# Patient Record
Sex: Male | Born: 1935 | Race: White | Hispanic: No | Marital: Married | Smoking: Never smoker
Health system: Southern US, Community
[De-identification: ages and names within clinical notes are randomized; demographics above are authoritative.]

## PROBLEM LIST (undated history)

## (undated) ENCOUNTER — Emergency Department (HOSPITAL_COMMUNITY): Payer: Medicare Other | Source: Home / Self Care

## (undated) DIAGNOSIS — I4891 Unspecified atrial fibrillation: Secondary | ICD-10-CM

## (undated) DIAGNOSIS — E119 Type 2 diabetes mellitus without complications: Secondary | ICD-10-CM

## (undated) DIAGNOSIS — E782 Mixed hyperlipidemia: Secondary | ICD-10-CM

## (undated) DIAGNOSIS — I719 Aortic aneurysm of unspecified site, without rupture: Secondary | ICD-10-CM

## (undated) DIAGNOSIS — K219 Gastro-esophageal reflux disease without esophagitis: Secondary | ICD-10-CM

## (undated) DIAGNOSIS — T4145XA Adverse effect of unspecified anesthetic, initial encounter: Secondary | ICD-10-CM

## (undated) DIAGNOSIS — D649 Anemia, unspecified: Secondary | ICD-10-CM

## (undated) DIAGNOSIS — I10 Essential (primary) hypertension: Secondary | ICD-10-CM

## (undated) DIAGNOSIS — I509 Heart failure, unspecified: Secondary | ICD-10-CM

## (undated) DIAGNOSIS — J449 Chronic obstructive pulmonary disease, unspecified: Secondary | ICD-10-CM

## (undated) DIAGNOSIS — I351 Nonrheumatic aortic (valve) insufficiency: Secondary | ICD-10-CM

## (undated) DIAGNOSIS — T8859XA Other complications of anesthesia, initial encounter: Secondary | ICD-10-CM

## (undated) DIAGNOSIS — C801 Malignant (primary) neoplasm, unspecified: Secondary | ICD-10-CM

## (undated) DIAGNOSIS — Z87898 Personal history of other specified conditions: Secondary | ICD-10-CM

## (undated) DIAGNOSIS — I2510 Atherosclerotic heart disease of native coronary artery without angina pectoris: Secondary | ICD-10-CM

## (undated) DIAGNOSIS — I639 Cerebral infarction, unspecified: Secondary | ICD-10-CM

## (undated) HISTORY — PX: TOTAL KNEE ARTHROPLASTY: SHX125

## (undated) HISTORY — DX: Heart failure, unspecified: I50.9

## (undated) HISTORY — DX: Mixed hyperlipidemia: E78.2

## (undated) HISTORY — DX: Anemia, unspecified: D64.9

## (undated) HISTORY — DX: Unspecified atrial fibrillation: I48.91

## (undated) HISTORY — PX: CORONARY ARTERY BYPASS GRAFT: SHX141

## (undated) HISTORY — PX: COLON SURGERY: SHX602

## (undated) HISTORY — PX: HERNIA REPAIR: SHX51

## (undated) HISTORY — DX: Cerebral infarction, unspecified: I63.9

## (undated) HISTORY — DX: Chronic obstructive pulmonary disease, unspecified: J44.9

## (undated) HISTORY — DX: Gastro-esophageal reflux disease without esophagitis: K21.9

---

## 2004-06-25 HISTORY — PX: CORONARY ARTERY BYPASS GRAFT: SHX141

## 2004-06-25 HISTORY — PX: AORTIC VALVE REPLACEMENT: SHX41

## 2004-10-26 ENCOUNTER — Encounter (HOSPITAL_COMMUNITY): Admission: RE | Admit: 2004-10-26 | Discharge: 2004-10-27 | Payer: Self-pay | Admitting: Pulmonary Disease

## 2004-10-26 ENCOUNTER — Ambulatory Visit (HOSPITAL_COMMUNITY): Admission: RE | Admit: 2004-10-26 | Discharge: 2004-10-26 | Payer: Self-pay | Admitting: Internal Medicine

## 2004-10-26 ENCOUNTER — Ambulatory Visit: Payer: Self-pay | Admitting: Internal Medicine

## 2004-12-06 ENCOUNTER — Ambulatory Visit (HOSPITAL_COMMUNITY): Admission: RE | Admit: 2004-12-06 | Discharge: 2004-12-06 | Payer: Self-pay | Admitting: *Deleted

## 2004-12-06 ENCOUNTER — Ambulatory Visit: Payer: Self-pay | Admitting: *Deleted

## 2004-12-11 ENCOUNTER — Inpatient Hospital Stay (HOSPITAL_BASED_OUTPATIENT_CLINIC_OR_DEPARTMENT_OTHER): Admission: RE | Admit: 2004-12-11 | Discharge: 2004-12-11 | Payer: Self-pay | Admitting: Cardiovascular Disease

## 2004-12-11 ENCOUNTER — Ambulatory Visit: Payer: Self-pay | Admitting: Cardiovascular Disease

## 2004-12-11 ENCOUNTER — Inpatient Hospital Stay (HOSPITAL_COMMUNITY): Admission: AD | Admit: 2004-12-11 | Discharge: 2004-12-19 | Payer: Self-pay | Admitting: *Deleted

## 2004-12-12 ENCOUNTER — Ambulatory Visit: Payer: Self-pay | Admitting: *Deleted

## 2004-12-14 ENCOUNTER — Encounter (INDEPENDENT_AMBULATORY_CARE_PROVIDER_SITE_OTHER): Payer: Self-pay | Admitting: Specialist

## 2004-12-28 ENCOUNTER — Ambulatory Visit: Payer: Self-pay | Admitting: *Deleted

## 2004-12-28 ENCOUNTER — Ambulatory Visit (HOSPITAL_COMMUNITY): Admission: RE | Admit: 2004-12-28 | Discharge: 2004-12-28 | Payer: Self-pay | Admitting: *Deleted

## 2005-01-26 ENCOUNTER — Ambulatory Visit: Payer: Self-pay | Admitting: Internal Medicine

## 2005-01-26 ENCOUNTER — Ambulatory Visit (HOSPITAL_COMMUNITY): Admission: RE | Admit: 2005-01-26 | Discharge: 2005-01-26 | Payer: Self-pay | Admitting: Internal Medicine

## 2005-01-30 ENCOUNTER — Ambulatory Visit: Payer: Self-pay | Admitting: *Deleted

## 2005-03-01 ENCOUNTER — Ambulatory Visit: Payer: Self-pay | Admitting: *Deleted

## 2005-03-26 ENCOUNTER — Ambulatory Visit: Payer: Self-pay | Admitting: Cardiology

## 2005-05-10 ENCOUNTER — Ambulatory Visit: Payer: Self-pay | Admitting: *Deleted

## 2005-06-01 ENCOUNTER — Ambulatory Visit: Payer: Self-pay

## 2005-06-02 ENCOUNTER — Emergency Department (HOSPITAL_COMMUNITY): Admission: EM | Admit: 2005-06-02 | Discharge: 2005-06-02 | Payer: Self-pay | Admitting: Emergency Medicine

## 2005-08-02 ENCOUNTER — Ambulatory Visit: Payer: Self-pay | Admitting: *Deleted

## 2005-10-29 ENCOUNTER — Ambulatory Visit: Payer: Self-pay | Admitting: *Deleted

## 2005-12-04 ENCOUNTER — Ambulatory Visit: Payer: Self-pay | Admitting: *Deleted

## 2006-02-05 ENCOUNTER — Ambulatory Visit: Payer: Self-pay | Admitting: *Deleted

## 2006-04-24 ENCOUNTER — Ambulatory Visit: Payer: Self-pay | Admitting: *Deleted

## 2006-07-26 ENCOUNTER — Ambulatory Visit (HOSPITAL_COMMUNITY): Admission: RE | Admit: 2006-07-26 | Discharge: 2006-07-26 | Payer: Self-pay | Admitting: Pulmonary Disease

## 2006-07-30 ENCOUNTER — Ambulatory Visit: Payer: Self-pay | Admitting: Cardiology

## 2006-08-01 ENCOUNTER — Ambulatory Visit: Payer: Self-pay | Admitting: *Deleted

## 2006-08-01 LAB — CONVERTED CEMR LAB
Chloride: 97 meq/L (ref 96–112)
GFR calc non Af Amer: 118 mL/min

## 2006-08-02 ENCOUNTER — Ambulatory Visit (HOSPITAL_COMMUNITY): Admission: RE | Admit: 2006-08-02 | Discharge: 2006-08-02 | Payer: Self-pay | Admitting: Pulmonary Disease

## 2006-08-30 ENCOUNTER — Ambulatory Visit: Payer: Self-pay | Admitting: *Deleted

## 2006-08-30 LAB — CONVERTED CEMR LAB
CO2: 30 meq/L (ref 19–32)
Calcium: 9.1 mg/dL (ref 8.4–10.5)
Chloride: 101 meq/L (ref 96–112)
Creatinine, Ser: 0.9 mg/dL (ref 0.4–1.5)
GFR calc Af Amer: 107 mL/min
GFR calc non Af Amer: 88 mL/min
Glucose, Bld: 98 mg/dL (ref 70–99)

## 2007-01-02 ENCOUNTER — Ambulatory Visit: Payer: Self-pay | Admitting: Cardiovascular Disease

## 2007-06-12 ENCOUNTER — Ambulatory Visit (HOSPITAL_COMMUNITY): Admission: RE | Admit: 2007-06-12 | Discharge: 2007-06-12 | Payer: Self-pay | Admitting: Urology

## 2007-06-13 ENCOUNTER — Ambulatory Visit: Admission: RE | Admit: 2007-06-13 | Discharge: 2007-06-24 | Payer: Self-pay | Admitting: Radiation Oncology

## 2007-07-08 ENCOUNTER — Ambulatory Visit: Payer: Self-pay | Admitting: Cardiovascular Disease

## 2007-08-05 ENCOUNTER — Ambulatory Visit: Admission: RE | Admit: 2007-08-05 | Discharge: 2007-11-03 | Payer: Self-pay | Admitting: Radiation Oncology

## 2007-09-19 ENCOUNTER — Ambulatory Visit (HOSPITAL_COMMUNITY): Admission: RE | Admit: 2007-09-19 | Discharge: 2007-09-19 | Payer: Self-pay | Admitting: Pulmonary Disease

## 2007-10-17 ENCOUNTER — Ambulatory Visit (HOSPITAL_BASED_OUTPATIENT_CLINIC_OR_DEPARTMENT_OTHER): Admission: RE | Admit: 2007-10-17 | Discharge: 2007-10-17 | Payer: Self-pay | Admitting: Urology

## 2007-11-12 ENCOUNTER — Ambulatory Visit: Admission: RE | Admit: 2007-11-12 | Discharge: 2007-12-17 | Payer: Self-pay | Admitting: Radiation Oncology

## 2008-07-23 DIAGNOSIS — K219 Gastro-esophageal reflux disease without esophagitis: Secondary | ICD-10-CM

## 2008-07-23 DIAGNOSIS — Z951 Presence of aortocoronary bypass graft: Secondary | ICD-10-CM | POA: Insufficient documentation

## 2008-07-23 DIAGNOSIS — E119 Type 2 diabetes mellitus without complications: Secondary | ICD-10-CM

## 2008-07-23 DIAGNOSIS — E782 Mixed hyperlipidemia: Secondary | ICD-10-CM | POA: Insufficient documentation

## 2008-07-23 DIAGNOSIS — I10 Essential (primary) hypertension: Secondary | ICD-10-CM | POA: Insufficient documentation

## 2008-07-23 DIAGNOSIS — Z952 Presence of prosthetic heart valve: Secondary | ICD-10-CM

## 2008-07-29 ENCOUNTER — Ambulatory Visit: Payer: Self-pay | Admitting: Cardiovascular Disease

## 2008-10-12 ENCOUNTER — Telehealth: Payer: Self-pay | Admitting: Cardiovascular Disease

## 2008-10-22 ENCOUNTER — Encounter: Payer: Self-pay | Admitting: Cardiovascular Disease

## 2008-10-27 ENCOUNTER — Telehealth: Payer: Self-pay | Admitting: Cardiovascular Disease

## 2008-10-29 ENCOUNTER — Ambulatory Visit: Payer: Self-pay | Admitting: Cardiovascular Disease

## 2008-10-29 ENCOUNTER — Ambulatory Visit: Payer: Self-pay

## 2008-10-29 ENCOUNTER — Encounter: Payer: Self-pay | Admitting: Cardiovascular Disease

## 2008-10-29 DIAGNOSIS — R0602 Shortness of breath: Secondary | ICD-10-CM | POA: Insufficient documentation

## 2008-11-03 ENCOUNTER — Telehealth: Payer: Self-pay | Admitting: Cardiovascular Disease

## 2009-01-31 ENCOUNTER — Telehealth: Payer: Self-pay | Admitting: Cardiovascular Disease

## 2009-03-01 ENCOUNTER — Telehealth: Payer: Self-pay | Admitting: Cardiovascular Disease

## 2009-03-30 ENCOUNTER — Telehealth: Payer: Self-pay | Admitting: Cardiovascular Disease

## 2009-04-25 ENCOUNTER — Encounter: Payer: Self-pay | Admitting: Cardiovascular Disease

## 2009-04-27 ENCOUNTER — Encounter (INDEPENDENT_AMBULATORY_CARE_PROVIDER_SITE_OTHER): Payer: Self-pay | Admitting: *Deleted

## 2009-05-26 ENCOUNTER — Telehealth: Payer: Self-pay | Admitting: Cardiovascular Disease

## 2009-06-02 ENCOUNTER — Ambulatory Visit: Payer: Self-pay | Admitting: Cardiovascular Disease

## 2009-08-24 ENCOUNTER — Telehealth: Payer: Self-pay | Admitting: Cardiovascular Disease

## 2009-09-05 ENCOUNTER — Encounter: Payer: Self-pay | Admitting: Cardiovascular Disease

## 2010-01-10 ENCOUNTER — Encounter: Payer: Self-pay | Admitting: Cardiovascular Disease

## 2010-01-11 ENCOUNTER — Telehealth: Payer: Self-pay | Admitting: Cardiovascular Disease

## 2010-01-13 ENCOUNTER — Telehealth: Payer: Self-pay | Admitting: Cardiovascular Disease

## 2010-01-13 ENCOUNTER — Encounter: Payer: Self-pay | Admitting: Cardiovascular Disease

## 2010-01-19 ENCOUNTER — Telehealth: Payer: Self-pay | Admitting: Cardiovascular Disease

## 2010-01-24 ENCOUNTER — Encounter: Payer: Self-pay | Admitting: Cardiovascular Disease

## 2010-01-26 ENCOUNTER — Ambulatory Visit: Payer: Self-pay | Admitting: Cardiovascular Disease

## 2010-01-26 DIAGNOSIS — R42 Dizziness and giddiness: Secondary | ICD-10-CM

## 2010-03-13 ENCOUNTER — Encounter: Payer: Self-pay | Admitting: Cardiovascular Disease

## 2010-03-29 ENCOUNTER — Telehealth: Payer: Self-pay | Admitting: Cardiovascular Disease

## 2010-03-29 ENCOUNTER — Encounter: Payer: Self-pay | Admitting: Cardiovascular Disease

## 2010-04-04 ENCOUNTER — Ambulatory Visit: Payer: Self-pay | Admitting: Cardiovascular Disease

## 2010-07-25 NOTE — Progress Notes (Signed)
Summary: Pt having dizziness  Phone Note Call from Patient Call back at Home Phone [phone]   Caller: Patient Summary of Call: Pt having dizziness. Initial call taken by: Judie Grieve,  January 11, 2010 10:09 AM  Follow-up for Phone Call        I called and spoke with the pt. He states yesterday he went up the steps to his condo. When he got to the top and inside, he got very dizzy and fell. He did not pass out. He did hurt his knees and is due to see his orthopedic doctor tomorrow. His wife called EMS and they did take him to the hospital for evaluation. The doctor in the ER did not know the cause of his dizziness. He did recommend the pt f/u with his cardiologist. The pt states this is the third time in the past couple of months that he has had this dizziness. I asked if the pt relates this to positional changes. He states he could not. He request to see Dr. Eden Emms. He will be in town on 8/5 and the following week. I explained Dr. Eden Emms is out of town the week of 8/8. He is here on 8/5, but Dr. Fabio Bering nurse will need to give the ok to work him in the schedule. I have advised I will leave this message for her and ask her to call the pt back next week to see about scheduling his appt. He is agreeable. Follow-up by: Sherri Rad, RN, BSN,  January 11, 2010 10:25 AM

## 2010-07-25 NOTE — Letter (Signed)
Summary: [facility] Office Visit Note   [facility] Office Visit Note   Imported By: Roderic Ovens 04/10/2010 11:18:30  _____________________________________________________________________  External Attachment:    Type:   Image     Comment:   External Document

## 2010-07-25 NOTE — Assessment & Plan Note (Signed)
Summary: per check out/sf   CC:  dizziness.  History of Present Illness: Terry Wu has a history of coronary bypass surgery with tissue aortic valve replacement 2006.  His been doing well.  F/U echo  10/2008 showed normal LV funtion and AVR with no perivalvular leak.  [name] He i s complaining of some fatigue and shortness of breath.   He continues to have issues with dizzyness.  I reviewed his cardiac rehab numbers and HR and BP are fine.  He is only doing 3.9 mets and activity limited by knee pain.  His dizzyness has a postural componant on occasion but his BP readings from home and rehab are fine and he has never been postural in the office.  We discussed f/u with Dr Shiela Mayer and stopping his uroxatrol.  He is on this for frequency   He is a nonsmoker and has no history of lung disease.  Denies any significant chest pain PND or orthopnea.  Has been no previous heart failure.   He will F/U with Dr Juanetta Gosling and his ENT doctor for further w/u of his dizzyness.     Current Problems (verified): 1)  Dizziness  (ICD-780.4) 2)  Shortness of Breath  (ICD-786.05) 3)  Esophageal Reflux  (ICD-530.81) 4)  Aodm  (ICD-250.00) 5)  Mixed Hyperlipidemia  (ICD-272.2) 6)  Hypertension, Benign  (ICD-401.1) 7)  Aortic Valve Replacement, Hx of  (ICD-V43.3) 8)  Coronary Artery Bypass Graft, Hx of  (ICD-V45.81)  Current Medications (verified): 1)  Coreg 12.5 Mg Tabs (Carvedilol) .[name].. 1 Tablet By Mouth Twice A Day 2)  Crestor 5 Mg Tabs (Rosuvastatin Calcium) .... Take 1 Tablet By Mouth Every Other Day 3)  Norvasc 10 Mg Tabs (Amlodipine Besylate) .[name].. 1 Tab By Mouth Once Daily 4)  Plavix 75 Mg Tabs (Clopidogrel Bisulfate) .[name].. 1 Tab By Mouth Once Daily 5)  Protonix 40 Mg Tbec (Pantoprazole Sodium) .... Take 1 Tablet By Mouth Once A Day 6)  Maxzide-25 37.5-25 Mg Tabs (Triamterene-Hctz) .[name].. 1 Tab By Mouth Once Daily 7)  Amaryl 1 Mg Tabs (Glimepiride) .[name].. 1 Tab By Mouth Once Daily 8)  Travatan 0.004 % Soln (Travoprost) ....  As Needed 9)  Icaps Plus  Tabs (Multiple Vitamins-Minerals) .... 2 Tabs After Each Meal 10)  Amoxicillin 200mg  .... As Needed For Dental 11)  Micardis 20 Mg Tabs (Telmisartan) .[name].. 1 Tab By Mouth Once Daily 12)  Potassium Chloride Crys Cr 20 Meq Cr-Tabs (Potassium Chloride Crys Cr) .[name].. 1 Tab By Mouth Two Times A Day 13)  Tylenol Extra Strength 500 Mg Tabs (Acetaminophen) .... As Needed 14)  Uroxatral 10 Mg Xr24h-Tab (Alfuzosin Hcl) .[name].. 1 Tab By Mouth Once Daily  Allergies (verified): No Known Drug Allergies  Past History:  Past Medical History: Last updated: 10/29/2008 CAD Cancer Hyperlipidemia Hypertension s/p av replacement 6/06 s/p cabg 6/06 heart cath 12/11/04  Past Surgical History: Last updated: 10/29/2008 CABG/AVR:  2006  Family History: Last updated: 2008-08-01 Father:decease 75 stroke Mother:decease 84 heart condition Siblings:1 brother decease stroke 1 sister decease cancer  Social History: Last updated: 10/29/2008 Retired  Married  Tobacco Use - No.  Alcohol Use - yes social Drug Use - no 2 children Likes to golf.  Involved with financial markets Spends 3 weeks/month at [facility]  Review of Systems       Denies fever, malais, weight loss, blurry vision, decreased visual acuity, cough, sputum, SOB, hemoptysis, pleuritic pain, palpitaitons, heartburn, abdominal pain, melena, lower extremity edema, claudication, or rash.   Vital  Signs:  Patient profile:   77 year old male Height:      69 inches Weight:      234 pounds BMI:     34.68 Pulse rate:   60 / minute Pulse (ortho):   67 / minute Pulse rhythm:   regular Resp:     12 per minute BP sitting:   142 / 80  (left arm) BP standing:   140 / 85  Vitals Entered By: Kem Parkinson (April 04, 2010 9:22 AM)  Physical Exam  General:  Affect appropriate Healthy:  appears stated age HEENT: normal Neck supple with no adenopathy JVP normal no bruits no thyromegaly Lungs clear with no wheezing  and good diaphragmatic motion Heart:  S1/S2 systolic murmur,rub, gallop or click PMI normal Abdomen: benighn, BS positve, no tenderness, no AAA no bruit.  No HSM or HJR Distal pulses intact with no bruits No edema Neuro non-focal Skin warm and dry    Impression & Recommendations:  Problem # 1:  DIZZINESS (ICD-780.4) Non cardiac not really changes with ARB dose.  Multifactorial F/U primary and ENT.  Stop uroxatrol and continue cardiac rehab at highter work load  Problem # 2:  HYPERTENSION, BENIGN (ICD-401.1) Well controlled and not postural  His updated medication list for this problem includes:    Coreg 12.5 Mg Tabs (Carvedilol) .[name]... 1 tablet by mouth twice a day    Norvasc 10 Mg Tabs (Amlodipine besylate) .[name]... 1 tab by mouth once daily    Maxzide-25 37.5-25 Mg Tabs (Triamterene-hctz) .[name]... 1 tab by mouth once daily    Micardis 20 Mg Tabs (Telmisartan) .[name]... 1 tab by mouth once daily  Problem # 3:  MIXED HYPERLIPIDEMIA (ICD-272.2) Well cotrolled His updated medication list for this problem includes:    Crestor 5 Mg Tabs (Rosuvastatin calcium) .[name]... Take 1 tablet by mouth every other day  Problem # 4:  AORTIC VALVE REPLACEMENT, HX OF (ICD-V43.3) Normla exam with no AR SBE card given  Problem # 5:  CORONARY ARTERY BYPASS GRAFT, HX OF (ICD-V45.81) No angina continue BB and ASA  Patient Instructions: 1)  Your physician recommends that you schedule a follow-up appointment in: ONE YEAR   EKG Report  Procedure date:  01/26/2010  Findings:      NSR 57 PR [address] changes

## 2010-07-25 NOTE — Progress Notes (Signed)
Summary: refill request  Phone Note Refill Request Message from:  Patient on March 29, 2010 9:27 AM  Refills Requested: Medication #1:  CRESTOR 5 MG TABS Take 1 tablet by mouth every other day [facility] [address]-needs today-pt out-said pharmacy sent fax last friday   Method Requested: Telephone to Pharmacy Initial call taken by: Glynda Jaeger,  March 29, 2010 9:28 AM    Prescriptions: CRESTOR 5 MG TABS (ROSUVASTATIN CALCIUM) Take 1 tablet by mouth every other day  #30 x 6   Entered by:   Scherrie Bateman, LPN   Authorized by:   Colon Branch, MD, [facility]   Signed by:   Scherrie Bateman, LPN on 60/45/4098   Method used:   Electronically to        [facility]* (retail)       [address]       [place], [place]  [address]       Ph: [phone]       Fax: [phone]   RxID:   [id number]   Appended Document: refill request LEFT WORD THAT MED WAS SENT VIA EMR./CY

## 2010-07-25 NOTE — Progress Notes (Signed)
Summary: blood work prior to appt  Phone Note Call from Patient Call back at [facility] Phone [phone]   Caller: Patient Reason for Call: Talk to Nurse, Lab or Test Results Details for Reason: would like to have blood work prior to appt in aug.  Initial call taken by: Lorne Skeens,  January 19, 2010 4:14 PM  Follow-up for Phone Call        order mailed to pt for lipid panel Deliah Goody, RN  January 19, 2010 5:25 PM

## 2010-07-25 NOTE — Letter (Signed)
Summary: [facility] - Condensed Daily Report  [facility] - Condensed Daily Report   Imported By: Marylou Mccoy 04/20/2010 10:22:48  _____________________________________________________________________  External Attachment:    Type:   Image     Comment:   External Document

## 2010-07-25 NOTE — Progress Notes (Signed)
       Additional Follow-up for Phone Call Additional follow up Details #2::    Called pharmacy gave pt 12 refills of carvediliol. Could not find pharmacy in EMR [facility] Follow-up by: Kem Parkinson,  August 24, 2009 11:56 AM   Appended Document:        Follow-up for Phone Call       Follow-up by: Kem Parkinson,  August 24, 2009 11:58 AM    Additional Follow-up for Phone Call Additional follow up Details #2::    Also sent in plavix 12 refills Follow-up by: Kem Parkinson,  August 24, 2009 11:58 AM

## 2010-07-25 NOTE — Letter (Signed)
Summary: [facility] Office Note  [facility] Office Note   Imported By: Roderic Ovens 09/28/2009 12:40:18  _____________________________________________________________________  External Attachment:    Type:   Image     Comment:   External Document

## 2010-07-25 NOTE — Miscellaneous (Signed)
Summary: [facility] Cardiac Monthly Report   [facility] Cardiac Monthly Report   Imported By: Roderic Ovens 01/31/2010 12:53:04  _____________________________________________________________________  External Attachment:    Type:   Image     Comment:   External Document

## 2010-07-25 NOTE — Progress Notes (Signed)
Summary: Med List   Med List   Imported By: Roderic Ovens 01/31/2010 12:54:06  _____________________________________________________________________  External Attachment:    Type:   Image     Comment:   External Document

## 2010-07-25 NOTE — Letter (Signed)
Summary: List of Concerns From Patient   List of Concerns From Patient   Imported By: Roderic Ovens 02/07/2010 12:49:25  _____________________________________________________________________  External Attachment:    Type:   Image     Comment:   External Document

## 2010-07-25 NOTE — Progress Notes (Signed)
Summary: calling re fax he sent to nurse  Phone Note Call from Patient   Caller: Patient Reason for Call: Talk to Nurse Complaint: Cough/Sore throat Summary of Call: pt said he is waiting on a call re a fax he sent for the nurse -pls call [phone] Initial call taken by: Glynda Jaeger,  January 13, 2010 1:47 PM  Follow-up for Phone Call        Spoke with patient regarding a document faxed by pt. this am adressed to Dr. Eden Emms. Pt. would like to know if we received that document. Pt also states has questions regarding medicatons included in the document. I let pt. know will see if document has arrived . I will call him back soon. Ollen Gross, RN, BSN  January 13, 2010 2:03 PM   Additional Follow-up for Phone Call Additional follow up Details #1::        PT INFORMED  RECORDS RECEIVED  INSTRUCTED TO CONT WITH SAME MEDS NO CHANGE AND WILL SEE  DR Eden Emms ON AUGUST 4 ,2011

## 2010-07-25 NOTE — Letter (Signed)
Summary: Letter from Patient to Doctor  Letter from Patient to Doctor   Imported By: Marylou Mccoy 01/31/2010 14:56:50  _____________________________________________________________________  External Attachment:    Type:   Image     Comment:   External Document

## 2010-07-25 NOTE — Progress Notes (Signed)
Summary: [facility] Vitals   [facility] Home Vitals   Imported By: Roderic Ovens 02/07/2010 12:55:30  _____________________________________________________________________  External Attachment:    Type:   Image     Comment:   External Document

## 2010-07-25 NOTE — Letter (Signed)
Summary: [facility] - Office Note  [facility] - Office Note   Imported By: Marylou Mccoy 04/04/2010 07:59:36  _____________________________________________________________________  External Attachment:    Type:   Image     Comment:   External Document

## 2010-07-25 NOTE — Assessment & Plan Note (Signed)
Summary: rov c/o near syncope x 3 ./cy   CC:  dizziness and fatigue.  History of Present Illness: Terry Wu has a history of coronary bypass surgery with tissue aortic valve replacement 2006.  His been doing well.  F/U echo  10/2008 showed normal LV funtion and AVR with no perivalvular leak.  [name] He i s complaining of some fatigue and shortness of breath.  He is very active and plays golf on a regular basis.  He feels tired at the end of the day.  He is a nonsmoker and has no history of lung disease.  Denies any significant chest pain PND or orthopnea.  Has been no previous heart failure.   He continues to have dizzyness.  We will cut back his micardis and have him separate his ARB and norvasc during the day.  His postural BP was normal today and his home BP readings are also normal.    Current Problems (verified): 1)  Shortness of Breath  (ICD-786.05) 2)  Esophageal Reflux  (ICD-530.81) 3)  Aodm  (ICD-250.00) 4)  Mixed Hyperlipidemia  (ICD-272.2) 5)  Hypertension, Benign  (ICD-401.1) 6)  Aortic Valve Replacement, Hx of  (ICD-V43.3) 7)  Coronary Artery Bypass Graft, Hx of  (ICD-V45.81)  Current Medications (verified): 1)  Coreg 12.5 Mg Tabs (Carvedilol) .[name].. 1 Tablet By Mouth Twice A Day 2)  Crestor 5 Mg Tabs (Rosuvastatin Calcium) .... Take 1 Tablet By Mouth Every Other Day 3)  Norvasc 10 Mg Tabs (Amlodipine Besylate) .[name].. 1 Tab By Mouth Once Daily 4)  Plavix 75 Mg Tabs (Clopidogrel Bisulfate) .[name].. 1 Tab By Mouth Once Daily 5)  Protonix 40 Mg Tbec (Pantoprazole Sodium) .... Take 1 Tablet By Mouth Once A Day 6)  Maxzide-25 37.5-25 Mg Tabs (Triamterene-Hctz) .[name].. 1 Tab By Mouth Once Daily 7)  Amaryl 1 Mg Tabs (Glimepiride) .[name].. 1 Tab By Mouth Once Daily 8)  Travatan 0.004 % Soln (Travoprost) .... As Needed 9)  Icaps Plus  Tabs (Multiple Vitamins-Minerals) .... 2 Tabs After Each Meal 10)  Amoxicillin 200mg  .... As Needed For Dental 11)  Micardis 40 Mg Tabs (Telmisartan) .[name].. 1 Tab By Mouth Once  Daily 12)  Potassium Chloride Crys Cr 20 Meq Cr-Tabs (Potassium Chloride Crys Cr) .[name].. 1 Tab By Mouth Two Times A Day 13)  Tylenol Extra Strength 500 Mg Tabs (Acetaminophen) .... As Needed  Allergies (verified): No Known Drug Allergies  Past History:  Past Medical History: Last updated: 10/29/2008 CAD Cancer Hyperlipidemia Hypertension s/p av replacement 6/06 s/p cabg 6/06 heart cath 12/11/04  Past Surgical History: Last updated: 10/29/2008 CABG/AVR:  2006  Family History: Last updated: 22-Aug-2008 Father:decease 75 stroke Mother:decease 84 heart condition Siblings:1 brother decease stroke 1 sister decease cancer  Social History: Last updated: 10/29/2008 Retired  Married  Tobacco Use - No.  Alcohol Use - yes social Drug Use - no 2 children Likes to golf.  Involved with financial markets Spends 3 weeks/month [facility]  Review of Systems       Denies fever, malais, weight loss, blurry vision, decreased visual acuity, cough, sputum, SOB, hemoptysis, pleuritic pain, palpitaitons, heartburn, abdominal pain, melena, lower extremity edema, claudication, or rash.   Vital Signs:  Patient profile:   77 year old male Height:      69 inches Weight:      232 pounds BMI:     34.38 Pulse rate:   57 / minute Pulse (ortho):   59 / minute Resp:     12 per  minute BP sitting:   145 / 73  (left arm) BP standing:   140 / 72  Vitals Entered By: Kem Parkinson (January 26, 2010 11:22 AM)  Serial Vital Signs/Assessments:  Time      Position  BP       Pulse  Resp  Temp     By           Lying LA  145/73   54                    Kimalexis Barnes           Sitting   128/72   55                    Kimalexis Barnes           Standing  140/72   59                    Kimalexis Barnes   Physical Exam  General:  Affect appropriate Healthy:  appears stated age HEENT: normal Neck supple with no adenopathy JVP normal no bruits no thyromegaly Lungs clear with no wheezing and  good diaphragmatic motion Heart:  S1/S2 systolic  murmur,rub, gallop or click PMI normal Abdomen: benighn, BS positve, no tenderness, no AAA no bruit.  No HSM or HJR Distal pulses intact with no bruits No edema Neuro non-focal Skin warm and dry    Impression & Recommendations:  Problem # 1:  MIXED HYPERLIPIDEMIA (ICD-272.2) Labs per primary.  No myalgiss His updated medication list for this problem includes:    Crestor 5 Mg Tabs (Rosuvastatin calcium) .[name]... Take 1 tablet by mouth every other day  Problem # 2:  HYPERTENSION, BENIGN (ICD-401.1) Well controlled His updated medication list for this problem includes:    Coreg 12.5 Mg Tabs (Carvedilol) .[name]... 1 tablet by mouth twice a day    Norvasc 10 Mg Tabs (Amlodipine besylate) .[name]... 1 tab by mouth once daily    Maxzide-25 37.5-25 Mg Tabs (Triamterene-hctz) .[name]... 1 tab by mouth once daily    Micardis 40 Mg Tabs (Telmisartan) .[name]... 1 tab by mouth once daily  Problem # 3:  AORTIC VALVE REPLACEMENT, HX OF (ICD-V43.3) No change in murmur.  Normal tissue valve function by echo 2010.  Continue SBE prophylaxis  Problem # 4:  DIZZINESS (ICD-780.4) Not clear that this is related to his heart.  Will decrease micardis and separate other pills in time.  F/U 8-10 weeks  Patient Instructions: 1)  Your physician recommends that you schedule a follow-up appointment in: 10 WEEKS 2)  Your physician has recommended you make the following change in your medication: MICARDIAS 40MG  ONE HALF TABLET ONCE DAAILY Prescriptions: MICARDIS 40 MG TABS (TELMISARTAN) 1 tab by mouth once daily  #30 x 12   Entered by:   Deliah Goody, RN   Authorized by:   Colon Branch, MD, [facility]   Signed by:   Deliah Goody, RN on 01/26/2010   Method used:   Electronically to        [facility]* (retail)       [address]       [place], [place]  [address]       Ph: [phone]       Fax: [phone]   RxID:   [id number]    EKG  Report  Procedure date:  01/26/2010  Findings:      NSR 57  Nonspecific ST/T changes Abnormal ECG

## 2010-09-22 ENCOUNTER — Other Ambulatory Visit: Payer: Self-pay | Admitting: Cardiovascular Disease

## 2010-09-23 ENCOUNTER — Other Ambulatory Visit: Payer: Self-pay | Admitting: *Deleted

## 2010-09-23 MED ORDER — CLOPIDOGREL BISULFATE 75 MG PO TABS: 75.0000 mg | ORAL_TABLET | Freq: Every day | ORAL | Status: AC

## 2010-09-27 ENCOUNTER — Other Ambulatory Visit: Payer: Self-pay | Admitting: Cardiovascular Disease

## 2010-11-07 NOTE — Assessment & Plan Note (Signed)
[facility] HEALTHCARE                            CARDIOLOGY OFFICE NOTE   NAME:[name], [name]                       MRN:          [id number]  DATE:07/08/2007                            DOB:          November 25, 1931    Terry [name] returns today for followup.  He is a previous [name] of Dr.  Graceann [name].  He is status post CABG in tissue AVR in 2006 by Dr.  Laneta [name].  I did his catheterization at Terry time.   He has been doing fairly well from a cardiac perspective.  He is not  having any significant chest pain, PND, or orthopnea.  There is no  dyspnea.  He works out at cardiac rehab three days a week.  He splits  his time between  [place] and [place].   He and his wife had multiple issues to discuss today, Terry first of which  was trying to get through to our office.  She is a previous [name] of  Dr. Marcha [name] and wanted to try to see me, and apparently this was a  difficult thing to arrange.  I gave her my home phone number and Terry [name]'s  office direct number, and hopefully this will not be an issue in Terry  future.   REVIEW OF SYSTEMS:  Terry [name]'s review of systems has many facets to  it.  It took me a while to go through these.  Terry [name] has chronic  injected sclerae and sees Dr. Nile [name].  I recommended Terry [name] eye drops  to him.  Terry [name] is status post right shoulder surgery.  This seems  to be improving.  He has better range of motion.  He has recently  diagnosed prostate cancer.  It seems like it was caught early.  He has  been seen Dr. Vonita [name] and Dr. Dayton [name].  There seems to be a good game  plan in place regarding radiation. His PSA was only in Terry 3-4 range.  Terry [name] also is status post bilateral knee replacements by Dr.  __________  in [place].  He seems to be doing fairly well with this  and does not need to take a lot of antiinflammatories.   He is on chronic Plavix.  I will have to review Terry indications for  this, as he is status post  CABG with tissue AVR.   I told him it was fine to stop this prior to any surgeries.  I also told  him he was cleared for any further surgeries that he may need, as his  cardiac status is stable.   His LV function is normal.   His primary care physician is actually Terry [name], M.D., in  [place].   CURRENT MEDICATION:  1. Micardis 40 a day.  2. Plavix 75 a day.  3. Norvasc 10 a day.  4. Maxzide 25 a day.  5. Lipitor 10 a day.  6. Eye drops.  7. KCl 20 a day.  8. Metoprolol 12.5 b.i.d.  9. Amaryl.   Terry [name] also had been complaining previously of leg cramps.  They  clearly are  not claudication.  They may be reflected or made worse by  his Lipitor.  I told him we would try a 4-week trial off his Lipitor.   PHYSICAL EXAMINATION:  GENERAL:  Remarkable for a chronically ill-  appearing elderly white male, in no distress.  He does have injected  sclera.  VITAL SIGNS:  Blood pressure is 150/80, pulse 65 and regular, weight is  232.  He had an extensive blood pressure log for me to review.  It took  me about 5 minutes to look through about 15 pages of blood pressures.  We tend to do this with him, since he has white coat syndrome.  His  blood pressures at home running 120-130 systolic, heart rate is 60 and  regular, respiratory rate 14, afebrile.  HEENT:  Unremarkable.  NECK:  Carotids are without bruit, no lymphadenopathy, thyromegaly, or  JVP elevation.  LUNGS:  Clear.  Good diaphragmatic motion.  No wheezing.  HEART:  There is an S1, S2, with a systolic murmur through Terry tissue  prosthesis, which sounds normal.  There is no AI.  Sternotomy is well-  healed.  PMI is normal.  ABDOMEN:  Benign.  Bowel sounds positive.  No AAA, no  hepatosplenomegaly, no hepatojugular reflux.  EXTREMITIES:  Distal pulses are intact, with no edema.  He is status  post bilateral knee replacements.  NEUROLOGIC:  Nonfocal.  SKIN:  Warm and dry.  MUSCULOSKELETAL:  No muscular  weakness.   IMPRESSION:  1. Coronary disease, stable.  No angina.  Continue aspirin, Plavix,      and beta blocker.  2. Tissue aortic valve.  Currently sounds normal.  Follow up      echocardiogram in Terry year.  He does go to Terry dentist on a regular      basis.  His dentition is in good shape.  Continue amoxicillin SBE      prophylaxis.  3. Hyperlipidemia in Terry setting of tissue aortic valve and bypass,      possible myalgias from Lipitor.  Stop for four weeks.  If his      muscle aches get better, we will try him on Crestor 5 mg day.      Prescription was given.  His LDL was 77, with normal LFTs at Terry      end of last year.  4. Status post right shoulder surgery.  Continue rehab.  Range of      motion improved.  5. Prostate cancer.  Follow up with Dr. Dayton [name] and Dr. Vonita [name].      Cleared for any repeat biopsy, ultrasound, or surgery that is      needed.  Okay to stop Plavix prior to any of this.  6. Diabetes.  Apparently Terry [name] takes Amaryl from time to time.      Follow a low-caloric diet, and hemoglobin A1c quarterly.  7. Lower extremity edema, resolved.  Continue low-dose Maxzide 25 mg a      day, low-salt diet.  8. Injected sclera.  Follow up with Dr. Nile [name].  No evidence of      significant macular degeneration.  Continue eye drops for glaucoma.   All of Terry [name] in his wife's questions were answered today.  It took  some time, but they seem to be more comfortable with Terry practice and  being able to access me on a regular basis.     Terry Pick. Eden Emms, MD, [facility]  Electronically Signed    PCN/MedQ  DD: 07/08/2007  DT: 07/09/2007  Job #: [id number]

## 2010-11-07 NOTE — Assessment & Plan Note (Signed)
[facility] HEALTHCARE                            CARDIOLOGY OFFICE NOTE   NAME:Terry [name], Terry [name]                       MRN:          [id number]  DATE:07/29/2008                            DOB:          June 19, 1932    Terry [name] is previous patient of Dr. Graceann Congress.  He has a history  of a CABG with aortic valve replacement.   He has normal LV function.  Coronary risk factors include hypertension  and hyperlipidemia.   He is also a non-insulin-dependent diabetic.  Unfortunately, he  continues to gain weight.  His weight is up from 232 to 244.  The  patient has been relatively inactive.  He had previously been a cardiac  rehab at [facility] in [place], but has stopped this.  He has  poor caloric intake with lots of carbohydrates.   I talked to Onalee Hua at length about this in the need to increase his  activity levels.  It only costs him 40 dollars a month to go to [facility] and I wrote him a prescription to reinstitute that.  From his  cardiac perspective, he has not had any significant chest pain, PND, or  orthopnea.  He has mild exertional dyspnea, which sounds functional.  He  has not had any significant syncope or palpitations.  He has been  compliant with his medicines.  He and his wife had myriad of issues to  discuss today  The first of which was in the interaction with Zegerid  and his Plavix.  I told him that I would prefer him to be on Protonix  which seems to have the least amount of interaction with Plavix.  I will  have to look back through my charts and see why Dr. Corinda Gubler had him on  Plavix, since he is status post bypass surgery.   He seems to have had a significant cough 30 minutes after his  metoprolol.  With his hypertension and coronary artery disease, he needs  to be on a beta-blocker.  Since he is a diabetic, I told him I would  switch him to Coreg since it has the least amount of insulin resistance.  The patient continues  to have issues taking a statin drugs on low-dose.  He has had problems with pravastatin and Lipitor in the past.  He has  significant myalgias.  He is continuing to get them with just low-dose  Crestor.  For the time being we will tell him to take his Crestor every  other day and add coenzyme Q.  In general, his LDLs tend to be in the 70  range on low-dose statin therapy.  The patient also has significant  fatigue.  I told him this is not likely due to his heart.  He has normal  LV function and is probably a function of his weight.   REVIEW OF SYSTEMS:  Otherwise, remarkable for significant problems with  dry macular degeneration and glaucoma in his eyes, the right one in  particular is constantly bloodshot,  He sees Dr. Nile Riggs every 3 months  for this.  MEDICATIONS:  1. Micardis 40 a day.  2. Plavix 75 a day.  3. Norvasc 10 a day.  4. Maxzide 25 a day.  5. Eye drops.  6. Metoprolol 12.5 b.i.d. to be changed to Coreg 12.5 b.i.d.  7. Amaryl 2 mg a day.  8. Crestor 5 mg to be taken every other day.  9. Klor-Con 40 a day.  10.Zegerid to be switched to Protonix 40 a day.   PHYSICAL EXAMINATION:  GENERAL:  Remarkable for an obese male in no  distress.  VITAL SIGNS:  Weight is 244, blood pressure 150/76, pulse 64 and  regular, respiratory rate 14, and afebrile.  HEENT:  Unremarkable.  Sclarae are injected.  NECK:  Carotids are normal without bruit.  No lymphadenopathy,  thyromegaly, or JVP elevation.  LUNGS:  Clear.  Good diaphragmatic motion.  No wheezing.  CARDIAC:  S1 and S2.  Prosthetic valve sounds normal with systolic  ejection murmur.  No diastolic murmur.  PMI normal.  Sternotomy well  healed.  ABDOMEN:  Benign.  Bowel sounds positive.  No AAA.  No tenderness.  No  bruit.  No hepatosplenomegaly.  No hepatojugular reflux.  EXTREMITIES:  Distal pulses are intact.  No edema.  NEUROLOGIC:  Nonfocal.  SKIN:  Warm and dry.  MUSCULOSKELETAL:  No muscular weakness.    IMPRESSION:  1. Status post aortic valve replacement, normal functioning valve by      echocardiogram.  Follow up echocardiogram in a year.  Continue      subacute bacterial endocarditis prophylaxis.  2. Two-vessel coronary artery disease with bypass.  Continue aspirin      and beta-blocker.  Currently not having angina.  3. Hypertension, currently well controlled.  Continue current dose of      Micardis and low-sodium diet.  4. Cough associated with beta-blockers switched to Coreg to see if      this improves.  5. Central obesity.  Decrease caloric intake.  [place] type diet.      Follow up cardiac rehab at [facility].  6. Diabetes.  Hemoglobin A1c target is 6.5 or less.  Apparently, he      runs about 6.9.  I am surprised as he is gaining so much weight.      Continue oral hypoglycemics.  7. Hyperlipidemia.  I had coenzyme Q to see if this helps with      myalgias.  We will recheck his LDL if he can tolerate Crestor 5      every other day in about 3-4 months.  8. History of reflux.  Since he is on chronic Plavix, we will switch      him over to Protonix.  I will see him back 3-4 months.     [name], MD, [facility]  Electronically Signed    PCN/MedQ  DD: 07/29/2008  DT: 07/30/2008  Job #: [id number]

## 2010-11-07 NOTE — Assessment & Plan Note (Signed)
[facility] HEALTHCARE                            CARDIOLOGY OFFICE NOTE   NAME:Rede, BLUFORD SEDLER                       MRN:          [id number]  DATE:01/02/2007                            DOB:          1931-08-09    [name] returns today for followup.  He is a previous patient of Dr.  Glennon Hamilton.  I believe I did his heart cath.  He is about 2 years  status post aortic valve replacement.  He has significant hypertension,  hyperlipidemia.  He needs a shoulder surgery and was referred for pre-op  clearance by Dr. Loney Laurence in [place], [place].  In talking  to the patient he has been doing fine.  He has been active.  He is not  having significant chest pain, PND, or orthopnea.  There have been no  palpitations.  He had previous bilateral knee replacements by Dr.  Loney Laurence in 2004 without any complications.  The patient's bypass and  AVR were in June 2006.  He has good LV function.  He does not have any  bleeding diathesis.   He is allergic to ASPIRIN.   He is on Plavix, which will have to held before any surgery.   PAST HISTORY:  Otherwise remarkable for:  1. Hypercholesterolemia.  2. Hypertension.  3. Hyperlipidemia, status post bilateral knee replacements.   The patient is happily married.  His wife needs to follow up as a  patient of ours.  He enjoys golfing.  He lives in [address] most of the  time.  He is retired.  He does not drink or smoke.   FAMILY HISTORY:  Noncontributory.   MEDICATIONS:  1. Micardis 40 a day.  2. Plavix 75 a day.  3. Norvasc 10 a day.  4. Maxzide 37.5/25.  5. Lipitor 10 a day.  6. Toprol 12.5 b.i.d.  7. KCl 20 a day.   REVIEW OF SYSTEMS:  He has had some cramping in his legs.  There is no  history of claudication.  There is no history of neuropathy.  Otherwise  review of systems is negative.   PHYSICAL EXAMINATION:  GENERAL:  He is a healthy-appearing, elderly,  white male in no distress.  Affect is  appropriate.  VITAL SIGNS:  Blood pressure is 130/80, pulse is 70 and regular, weight  is 212.  He is afebrile.  Respiratory rate is 14.  HEENT:  Normal.  Carotids normal without bruit.  There is no JVP  elevation.  No lymphadenopathy.  No thyromegaly.  LUNGS:  Clear with normal diaphragmatic motion.  No wheezing.  There is  an S1 S2 with a soft systolic murmur through his tissue valve.  There is  no AI.  PMI is normal.  ABDOMEN:  Benign.  Bowel sounds positive.  No hepatosplenomegaly.  No  hepatojugular reflux.  No AAA.  No tenderness.  Femoral pulses are +3  bilaterally with no bruit.  EXTREMITIES:  He is status post bilateral knee replacements.  Distal  pulses are intact with no edema.  NEUROLOGIC:  Nonfocal.  There is no muscular weakness.  His baseline EKG is essentially normal with a first degree block.   Lab work was reviewed from January 01, 2007.  His LDL cholesterol was 65.  His LFTs are normal.  Potassium level was not drawn.   IMPRESSION:  1. The patient is cleared for surgery.  He is low risk.  He has done      well after his valve surgery.  He is not having chest pain.  He is      active golfing and doing cardiac rehab in [facility].  He did not      have any problems with his previous knee surgeries.  He will have      to stop his Plavix at least 7 days prior to the surgery.  2. History of tissue aortic valve replacement with residual systolic      ejection murmur.  Followup echo in a year.  3. First degree heart block.  Continue low dose Toprol.  Do not      increase dose.  No history of AV nodal block or syncope.  4. Hypercholesterolemia.  LDL cholesterol in excellent range at 65.      Normal LFTs.  Continue current regimen.  5. Cramping in the lower extremities, unclear etiology.  He should      probably have a followup B-MET and magnesium level some time in the      future.  He can get this with his preoperative labs.  Overall, I      think he will do well with the  shoulder surgery and I will see him      back in 6 months.  We spent approximately 10 additional minutes      filling out his preoperative form for Dr. Loney Laurence.     [name], MD, [facility]  Electronically Signed    PCN/MedQ  DD: 01/02/2007  DT: 01/02/2007  Job #: [id number]

## 2010-11-07 NOTE — Op Note (Signed)
NAME:  Terry [name], Terry [name]                ACCOUNT NO.:  [id number]   MEDICAL RECORD NO.:  [id number]          PATIENT TYPE:  AMB   LOCATION:  [facility]                         FACILITY:  [facility]   PHYSICIAN:  Maretta Bees. Vonita Moss, M.D.DATE OF BIRTH:  1932-05-05   DATE OF PROCEDURE:  10/17/2007  DATE OF DISCHARGE:                               OPERATIVE REPORT   PREOPERATIVE DIAGNOSIS:  Carcinoma prostate stage T2A.   POSTOPERATIVE DIAGNOSES:  Carcinoma prostate stage T2A.   PROCEDURE:  Radioactive seed implantation and cystoscopy.   SURGEON:  Dr. Larey Dresser.   ASSISTANT:  Dr. Chipper Herb.   ANESTHESIA:  General.   INDICATIONS:  This 77 year old gentleman was found to have a firm right  lobe of the prostate and underwent needle biopsy of the prostate that  showed bilateral Gleason VI carcinoma with significant volume of disease  and he was counseled about various therapeutic measures and he opted for  radioactive seed implantation after down sizing with Trelstar.  He has  been given antibiotic coverage for his heart values in the form of  gentamicin and IV ampicillin.   PROCEDURE:  The patient was brought to the operating room, placed in the  lithotomy position.  The lower abdomen and external genitalia were  prepped and draped in the usual fashion.  Foley catheter was inserted.  Transrectal ultrasound was inserted and treatment planning was  undertaken.  At this point, he had implantation of 26 activated needles  with 79 seeds with I-125.  Total apparent activity was 45.9780 mCi.  At  the end of the case he was cystoscoped and there was no evidence of  seeds in the urethra or bladder.  Fluoroscopic views showed good seed  implantation.  Foley catheter was inserted and the patient taken to  recovery room in good condition with no significant blood loss having  tolerated the procedure well.      Maretta Bees. Vonita Moss, M.D.  Electronically Signed     LJP/MEDQ  D:  10/17/2007  T:   10/17/2007  Job:  [id number]   cc:   Maryln Gottron, M.D.  Fax: [phone]

## 2010-11-10 NOTE — Consult Note (Signed)
NAME:  Terry Wu, Terry Wu                ACCOUNT NO.:  [id number]   MEDICAL RECORD NO.:  [id number]          PATIENT TYPE:  INP   LOCATION:  6532                         FACILITY:  [facility]   PHYSICIAN:  Evelene Croon, M.D.     DATE OF BIRTH:  December 26, 1931   DATE OF CONSULTATION:  12/11/2004  DATE OF DISCHARGE:                                   CONSULTATION   REFERRING PHYSICIAN:  Cecil Cranker, M.D.   REASON FOR CONSULTATION:  Severe three-vessel coronary disease and moderate  aortic insufficiency.   CLINICAL HISTORY:  This patient is a 77 year old gentleman with recent onset  of exertional anginal symptoms,who had a negative stress Cardiolite, but  exercised only to stage I of the Bruce protocol. He had an echocardiogram  that showed mild to moderate aortic insufficiency. Due to his symptoms  suggestive of exertional angina, he was scheduled for cardiac  catheterization today. He had gone to the beach last Friday for the weekend,  but developed severe hypertension and chest pain at rest while there. He  spent the weekend at [facility] in [place], [place],  for control of his blood pressure and anginal symptoms. He was discharged  yesterday and then returned to [facility] without further symptoms.  Elective cardiac catheterization was performed today and this showed about  20% left main stenosis. The LAD had 70% proximal and mid stenosis. The left  circumflex had about 40-50% stenosis in a marginal branch. The right  coronary had about 80% stenosis. There was 3+ aortic insufficiency. Left  ventricular function was normal by echocardiogram.   REVIEW OF SYSTEMS:  GENERAL:  He denies fever or chills. He has had a  several-month history of fatigue. EYES:  Negative. EARS, NOSE AND THROAT:  He does have some decreased hearing and has had problems with his left ear  with scarring of the eardrum and fluid developing behind his ear. He does  have a history of glaucoma.  ENDOCRINE:  He denies diabetes and  hypothyroidism. CARDIAC:  He denies any PND or orthopnea. He has had some  shortness of breath while he has had the chest pain. He denies peripheral  edema. He has had no palpitations. RESPIRATORY:  Denies cough and sputum  production. GASTROINTESTINAL:  He has no nausea or vomiting. Denies melena  and bright blood per rectum. He underwent hiatal hernia surgery through a  left thoracotomy incision in 1973. He denies current reflux symptoms.  GENITOURINARY:  He denies dysuria and hematuria. MUSCULOSKELETAL:  He has  severe degenerative disease of both knees and underwent bilateral knee  replacements in February of 2004. He does have some arthritis in his hands.  NEUROLOGICAL:  He denies any focal weakness or numbness. He denies dizziness  and syncope. PSYCHIATRIC:  Negative.  HEMATOLOGICAL:  Negative.   ALLERGIES:  ASPIRIN which causes swelling of lips and DEMEROL.   PAST MEDICAL HISTORY:  Significant for hypertension. He denies  hypercholesterolemia and diabetes. He is status post bilateral knee  replacements in February of 2004. He is status post a left thoracotomy with  hiatal  hernia repair by Dr. Hendricks Milo in 1973. He has undergone previous  endoscopy.   DENTAL HISTORY:  He sees his dentist every months and has had no active  dental problems. He does have one temporary crown which is supposed to be  replaced with a permanent crown in July.   SOCIAL HISTORY:  He is retired and spends part of his time in [place],  [place], and part of his time here.  Is major hobby is playing golf.  He does ride an exercise bicycle. He has two children and is married.   FAMILY HISTORY:  His father died of a stroke at age 6. His mother died of  heart disease at 66. A brother died of complications of a stroke. One sister  died of cancer and two are living. Two other brothers are living and well.   PHYSICAL EXAMINATION:  VITAL SIGNS:  His blood pressure  is 131/60 and his  pulse is 52 and regular. Respiratory rate is 16 and unlabored.  GENERAL APPEARANCE:  He is an obese white male in no distress.  HEENT:  Exam shows him be normocephalic and atraumatic. Pupils are equal and  reactive to light and accommodation. Extraocular muscles are intact. His  throat is clear.  NECK:  Exam shows normal carotid pulses bilaterally. There are no bruits.  There is no adenopathy or thyromegaly.  CARDIAC:  Exam shows a regular rate and rhythm with a grade 1-2/6 systolic  murmur over the aorta. There is a 1/6 diastolic murmur.  LUNGS:  Clear.  ABDOMEN:  Exam shows active bowel sounds. His abdomen is soft, obese and  nontender. No palpable masses or organomegaly.  EXTREMITIES:  Exam shows no peripheral edema. There are bilateral knee scars  from knee replacements. Pedal pulses are palpable bilaterally.  SKIN:  Warm and dry.  NEUROLOGIC:  Exam shows him to be alert and oriented x 3. Motor and sensory  exams are grossly normal.   IMPRESSION:  [name] has three-vessel coronary artery disease and  moderate 3+ aortic insufficiency with unstable anginal symptoms at rest. I  agree that coronary bypass graft surgery is the best treatment and further  ischemia and infarction. I have discussed the operative procedure with the  patient and his wife and son and daughter, including alternatives, benefits  and risks, including bleeding, blood transfusion, infection, stroke,  myocardial infarction, graft failure and death. I also discussed the options  for valve replacement, including tissue and mechanical valves. Given his age  of almost 89 years, I think a tissue valve would be the best choice for him.  He would not want to take Coumadin if avoidable and therefore a tissue valve  would be the best choice. I think the longevity of a tissue valve is good  enough that he would have a low risk of needing further surgery for aortic valve disease over his lifetime. He and  his family are in agreement with  using a tissue valve. We will plan to do surgery on Thursday, December 14, 2004.       BB/MEDQ  D:  12/11/2004  T:  12/11/2004  Job:  [id number]   cc:   Ramon Dredge L. Juanetta Gosling, M.D.  [address]  [place]  [place] [address]  Fax: [phone]

## 2010-11-10 NOTE — Letter (Signed)
February 05, 2006     Ramon Dredge L. Juanetta Gosling, MD  [address]  [place], [place] [address]   RE:  BAYLEN, BUCKNER  MRN:  [id number]  /  DOB:  Nov 15, 1931   Dear Ed:   I had the pleasure of seeing your nice patient Akshar Starnes for follow-up on  February 05, 2006.  As you know, he is a very pleasant 77 year old white  married male with coronary artery bypass graft surgery, aortic valve  replacement with a 29 mm Edwards pericardial Magna valve.  The patient is  getting along quite well, asymptomatic, playing golf three times a week.   He is on Micardis 40 mg, Plavix 75 mg, Norvasc 10 mg, Maxzide 37.5/25 mg,  Lipitor 10 mg, Toprol 12.5 mg b.i.d., potassium chloride 20 mEq every other  day.   PHYSICAL EXAMINATION:  VITAL SIGNS:  Blood pressure is 117/80, pulse 56,  sinus bradycardia.  GENERAL APPEARANCE:  Normal.  NECK:  JVP is not elevated.  Carotid pulses bilaterally without bruits.  LUNGS:  Clear.  CARDIAC:  A 2/6 short systolic ejection murmur in the aortic area, no  diastolic murmur.  ABDOMEN:  Normal.  EXTREMITIES:  Normal.   EKG reveals sinus bradycardia, first degree AV block, otherwise normal.   IMPRESSION:  1. Diagnoses as above, 14 months status post coronary artery bypass graft      and aortic valve replacement.  2. Hypertension.  3. Hyperlipidemia.   We plan to continue the same therapy.  Will check his BMP today and lipids  and LFTs.  He will return in 3 months.  Thank you for the opportunity to  share in this nice gentleman's care.    Sincerely,      E. Graceann Congress, MD, [facility]   EJL/MedQ  DD:  02/05/2006  DT:  02/05/2006  Job #:  [id number]

## 2010-11-10 NOTE — Procedures (Signed)
NAME:  Terry, Wu NO.:  [id number]   MEDICAL RECORD NO.:  [id number]          PATIENT TYPE:  OUT   LOCATION:  RAD                           FACILITY:  [facility]   PHYSICIAN:  Vida Roller, M.D.   DATE OF BIRTH:  22-Jun-1932   DATE OF PROCEDURE:  12/06/2004  DATE OF DISCHARGE:                                  ECHOCARDIOGRAM   PRIMARY CARE PHYSICIAN:  Dr. Juanetta Gosling.   Tape number [id number], tape count [id number].   This is a 77 year old man with a murmur. No previous echocardiograms.  Technical quality is difficult.   M-MODE TRACINGS:  The aorta is 41 mm.   Left atrium is 45 mm.   The septum is 18 mm.   Posterior wall is 15 mm.   Left ventricular diastolic dimension 45 mm.   Left ventricular systolic dimension 34 mm.   TWO-DIMENSIONAL AND DOPPLER IMAGING:  The left ventricle is normal size with  low normal ejection fraction estimated at 50 to 55%. There was moderate  concentric left ventricular hypertrophy with some evidence of upper septal  hypertrophy without left ventricular outflow gradient. There is no obvious  wall motion abnormalities seen.   Right ventricle is normal size with normal systolic function.   Both atrial appear to be dilated.   The aortic valve is sclerotic with mild to moderate aortic insufficiency. No  stenosis is seen.   The mitral valve is thickened with trace mitral regurgitation. No stenosis  is seen.   There is trace tricuspid regurgitation.   There is no pericardial effusion.   The aortic root appears to be dilated.   The inferior vena cava was not seen.       JH/MEDQ  D:  12/07/2004  T:  12/07/2004  Job:  [id number]   cc:   Ramon Dredge L. Juanetta Gosling, M.D.  [address]  [place]  [place] [address]  Fax: [phone]

## 2010-11-10 NOTE — Op Note (Signed)
NAME:  Terry Wu, Terry Wu                ACCOUNT NO.:  [id number]   MEDICAL RECORD NO.:  [id number]          PATIENT TYPE:  AMB   LOCATION:  DAY                           FACILITY:  [facility]   PHYSICIAN:  R. Roetta Sessions, M.D. DATE OF BIRTH:  November 25, 1931   DATE OF PROCEDURE:  01/26/2005  DATE OF DISCHARGE:                                 OPERATIVE REPORT   PROCEDURE:  Diagnostic esophagogastroduodenoscopy.   INDICATIONS FOR PROCEDURE:  The patient is a 77 year old gentleman with long  standing gastroesophageal reflux disease status post repair of hiatal  hernia, fundoplication three decades ago who is here for an EGD with a  history of basically five days of inability to swallow pills and solid food.  He tells me that symptoms started acutely last weekend; when he was in the  process of eating a rib eye steak, he felt all of the sudden he was unable  to swallow and has unable to get any of his pills down ever since although  he has been able to get some food down including potato soup and some other  food.   He was switched from Nexium to Zegerid recently, and this has continued to  serve him well as far as maintanence of gastroesophageal reflux disease  symptoms. He really has not had any chronic symptoms of solid food  dysphagia. He underwent an EGD because of recalcitrant reflux symptoms by  Dr. Karilyn Cota back on Oct 26, 2004. He was found to have no endoscopic evidence  of reflux esophagitis or small sliding hiatal hernia. Multiple gastric  polyps, a couple were ulcerated. He underwent snare polypectomy with  multiple polyps remaining. I understand these turned out to be hyperplastic,  and Terry Wu was told to come back in one year to have remaining polyps  removed. He has not had any melena or rectal bleeding. Dr. Juanetta Wu called me  yesterday and asked me to see him today.   He only comes up to [place] periodically from his primary residence in  [place], [place]. EGD is now  being done. This approach has been  discussed with the patient at length. Potential risks, benefits, and  alternatives have been reviewed and questions answered. He is agreeable.  Please see documentation in the medical record.   PROCEDURE NOTE:  O2 saturation, blood pressure, pulse, and respirations were  monitored throughout the entire procedure. Conscious sedation with Versed 4  mg IV and fentanyl 100 mcg in divided doses. SB prophylaxis was given in the  way of ampicillin 2 g IV and gentamicin 120 mg IV prior to the procedure  given in the PACU. He recently had five-vessel bypass surgery and valve  replacement. Cetacaine spray for topical oropharyngeal anesthesia.   INSTRUMENT:  Olympus video chip system.   FINDINGS:  Examination of the tubular esophagus revealed no mucosa  abnormalities. I felt the esophagus was somewhat baggy dilated and atonic.  There was no evidence of ring or stricture. There was a small hiatal hernia,  and if anything, the EG junction was somewhat patulous. Again, the mucosa of  the esophagus  appeared entirely normal, and the EG junction was easily  traversed with a scope.   Stomach:  Gastric cavity was empty and insufflated well with air. Thorough  examination of gastric mucosa including retroflexed view of the proximal  stomach and esophagogastric junction was undertaken. The patient did have a  small hiatal hernia. Appeared really no stigmata of prior fundoplication. He  had multiple gastric polyps. There was a good 1-cm stellate scar present  along the anterior wall/greater curvature of the stomach. Please see photos.  Otherwise, gastric mucosa appeared unremarkable. Pylorus patent and easily  traversed. Examination of bulb and second portion revealed no abnormalities.   THERAPEUTIC/DIAGNOSTIC MANEUVERS:  None.   The patient tolerated the procedure well and was reactive to endoscopy.   IMPRESSION:  1.  Patent tubular esophagus. Endoscopic observation  is one of the esophagus      being somewhat baggy dilated and atonic. The EG junction was      ____________ aside from a small hiatal hernia.  2.  Stomach. Multiple hyperplastic appearing polyps. Gastric mucosal scar      likely site of prior polypectomy.  3.  Small hiatal hernia. Patent pylorus. Normal D1 and D2.   Terry Wu acute symptoms during the past week are somewhat atypical for a  food impaction, but that is the most likely problem. I do not understand why  he had food impaction which persisted until just recently. His esophagus is  widely patent as stated above.   RECOMMENDATIONS:  1.  I do think it would be a good idea to get a baseline barium pill      esophagogram, and since he is only here intermittently, we will go ahead      and once the topical oropharyngeal anesthesia has worn off get a barium      pill esophagogram for baseline a little later today. At this point, he      was reassured. He is to continue his Zegerid.  2.  He should swallow pills with adequate fluids and chew food thoroughly      and have fluids on hand to assist with the swallowing process.  3.  Further recommendations to follow.       RMR/MEDQ  D:  01/26/2005  T:  01/26/2005  Job:  [id number]   cc:   Terry Wu, M.D.  [address]  [place]  [place] [address]  Fax: [phone]

## 2010-11-10 NOTE — Letter (Signed)
April 24, 2006    Terry Wu, M.D.  [address]  [place], [place] [address]   RE:  Terry Wu, NEPHEW  MRN:  [id number]  /  DOB:  [birth year]   Dear Terry Wu,   It was a pleasure to see your nice patient, Terry Wu, for followup on  April 24, 2006, 16-1/2 months postop CABG and aortic valve replacement by  Dr. Laneta Simmers.  It is noted that he also has a history of hypertension,  hyperlipidemia.   Patient is getting alone extremely well with no recurrent symptoms, and he  remains quite active.  His blood pressures have been normal.  He is  presently on Micardis 40 daily, Plavix 75, Norvasc 10, Maxzide 37.5/25  daily, Lipitor 10, metoprolol 12.5 b.i.d., KCL every other day.   PHYSICAL EXAMINATION:  VITAL SIGNS:  Blood pressure 129/67, pulse 65, normal  sinus rhythm.  GENERAL APPEARANCE:  Normal.  NECK:  JVP is not elevated.  Carotid pulses are palpable without bruits.  LUNGS:  Clear.  CARDIAC:  A 2/6 short systolic ejection murmur over the aortic area.  No  diastolic murmur.  ABDOMEN:  Unremarkable.  EXTREMITIES:  Normal.   DIAGNOSES:  As above.  Patient is doing quite well.  He is a year and a half  postoperative coronary artery bypass graft and aortic valve replacement for  aortic regurgitation and two vessel  coronary disease.  His hypertension is well controlled.  His lipids have  been obtained recently, and the results are pending.   We plan to continue the same therapy except we will change him from  metoprolol to Toprol XL 25 daily.  I will plan to see him back in 3-4  months.  Thank you for the opportunity to share in this nice patient's care.    Sincerely,     ______________________________  E. Graceann Congress, MD, [facility]    EJL/MedQ  DD: 04/24/2006  DT: 04/24/2006  Job #: [id number]

## 2010-11-10 NOTE — Op Note (Signed)
NAME:  Terry Wu, SCHOENFELDER                ACCOUNT NO.:  [id number]   MEDICAL RECORD NO.:  [id number]          PATIENT TYPE:  AMB   LOCATION:  DAY                           FACILITY:  [facility]   PHYSICIAN:  Lionel December, M.D.    DATE OF BIRTH:  [birth year]   DATE OF PROCEDURE:  10/26/2004  DATE OF DISCHARGE:                                 OPERATIVE REPORT   PROCEDURE:  Esophagogastroduodenoscopy with gastric polypectomy.   INDICATIONS:  Terry Wu is 77 year old Caucasian male with chronic GERD who had  anti-reflux surgery back in 1974 and now has been dependent on Nexium.  Lately he has been having frequent episodes of heartburn and regurgitation  despite taking the medicine and watching his diet. He was seen by Dr.  Juanetta Gosling recently and is scheduled for EGD. He was switched to Zegerid and  feels better. He is undergoing diagnostic EGD. Procedure risks were reviewed  with the patient and informed consent was obtained.   PREOPERATIVE MEDICATION:  Cetacaine spray for pharyngeal topical anesthesia,  Demerol 50 mg IV, Versed 10 mg IV in divided dose.   FINDINGS:  Procedure performed in endoscopy suite. The patient's vital signs  and O2 sat were monitored during procedure and remained stable. The patient  was placed in left lateral position and Olympus videoscope was passed in  oropharynx without any difficulty and into esophagus.   Esophagus. Mucosa of the esophagus was normal throughout. GE junction was at  38 cm and hiatus was at 40-41 cm. There was no ring or stricture noted. Note  that he has undergone esophageal dilation in the past and presently free of  dysphagia.   Stomach. It was empty and distended very well with insufflation. There were  two large polyps at gastric body along the greater curvature. Both of these  were ulcerated. The proximal of the two was about 2 cm. Multiple small  polyps were noted at proximal body. There were three more polyps. Five of  the largest polyps were  snared at the end of the procedure. All of these  except one piece were removed using a Roth net. No bleeding was noted from  any of the polypectomy sites.   Duodenum. Bulbar mucosa was normal. Scope was passed postbulbar duodenum  where mucosa and folds were normal. Endoscope was withdrawn. As I was  withdrawing the scope, I noted a whitish body in his lips and it was his  crown. This was saved and given to the patient.   The patient tolerated the procedure well.   FINAL DIAGNOSES:  1.  No endoscopic evidence of reflux esophagitis.  2.  Small sliding hiatal hernia.  3.  Previous fundal wrap is essentially nonexistent.  4.  Multiple gastric polyps. Two of these were ulcerated. Five of the      largest ones were snared. Remaining polyps were a couple millimeters      each.  5.  Normal exam of the first and second part of the duodenum.   Please note that the one of his crowns (off 12) came out. This was given to  the  patient. The patient was very comfortable during the procedure without  heaving or retching. I suspect that this was loose and ready to fall off.   RECOMMENDATIONS:  1.  The patient advised not to take any NSAIDs including Aleve 2 weeks.  2.  Carafate liquid 1 gram a.c. and q.h.s.  3.  He will resume his Zegerid as before.  4.  H. pylori serology.  5.  I will contact the patient with results of biopsy and further      recommendations.      NR/MEDQ  D:  10/26/2004  T:  10/26/2004  Job:  [id number]   cc:   Dr. Juanetta Gosling

## 2010-11-10 NOTE — Assessment & Plan Note (Signed)
[facility]                            CARDIOLOGY OFFICE NOTE   NAME:Terry Wu, Terry Wu                       MRN:          [id number]  DATE:07/30/2006                            DOB:          January 07, 1932    This is a 77 year old married white male patient who presents today for  clearance before undergoing cataract surgery scheduled for tomorrow.  He  has a history of coronary artery disease status post CABG x4 with LIMA  to the LAD, SVG to the diagonal branch of the LAD, SVG to the OM, and  SVG to the PDA as well as aortic valve replacement with a 25 mm Edwards  pericardial Magna valve in June 2006.  He has done extremely well since  that time.  He walks 25 minutes twice a day and also does rehab twice a  week without any problems.  He denies any chest pain, palpitations,  dyspnea, dyspnea on exertion, dizziness or presyncope.   CURRENT MEDICATIONS:  1. Micardis 40 mg daily.  2. Plavix 75 mg daily.  3. Norvasc 10 mg daily.  4. Maxzide 37.5/25 mg daily.  5. Lipitor 10 mg daily.  6. ICAPS two t.i.d.  7. Ophthalmic drops.  8. KCl 20 mEq every other day.  9. Toprol XL 25 mg daily.  10.He has been on an antibiotic for a sinus infection.   PHYSICAL EXAMINATION:  GENERAL APPEARANCE:  This is a very pleasant 17-  year-old white male in no acute distress.  VITAL SIGNS:  Blood pressure 108/70, pulse 74, weight 214.12.  NECK:  Without JVD, HJR, bruit or thyroid.  LUNGS:  Clear anterior, posterior and lateral.  CARDIOVASCULAR:  Regular rate and rhythm at 74 beats per minute, normal  S1 and S2 with a 1/6 systolic ejection murmur at the left sternal  border.  ABDOMEN:  Soft without organomegaly, masses, lesions or abnormal  tenderness.  EXTREMITIES:  Without clubbing, cyanosis, or edema.  He has good distal  pulses.   EKG:  Sinus rhythm with first degree AV block, no acute change.   IMPRESSION:  1. Coronary artery disease status post coronary artery  bypass grafting      x4 and aortic valve replacement with Edwards pericardial Magna      valve in June 2006.  2. Hypertension.  3. Status post bilateral knee replacements.  4. Status post left thoracotomy with hiatal hernia repair.  5. Cataracts.   PLAN:  Patient is felt to be low risk for cataract surgery and Dr.  Diona Browner says he can proceed without any further testing.  He is only 16  months post bypass and valve replacement.  Patient has been told to take  his extra amoxicillin this evening.  We are not clear whether this is to  complete his course for a sinus infection but he does not need any other  prophylactic antibiotics for cataract surgery for his valve.  He has  follow-up with Dr. Glennon Hamilton in March of this year.      Jacolyn Reedy, PA-C  Electronically Signed      Illene Bolus.  Diona Browner, MD  Electronically Signed   ML/MedQ  DD: 07/30/2006  DT: 07/30/2006  Job #: [id number]   cc:   Loraine Leriche T. Nile Riggs, M.D.

## 2010-11-10 NOTE — Cardiovascular Report (Signed)
NAME:  DAWN, KIPER NO.:  [id number]   MEDICAL RECORD NO.:  [id number]          PATIENT TYPE:  OIB   LOCATION:  6501                         FACILITY:  [facility]   PHYSICIAN:  Charlton Haws, M.D.     DATE OF BIRTH:  1932/01/26   DATE OF PROCEDURE:  12/11/2004  DATE OF DISCHARGE:                              CARDIAC CATHETERIZATION   INDICATIONS:  Mr. Zelman is a 77 year old old patient with chest pain and  multiple coronary risk factors.  He apparently had a normal Cardiolite study  at [facility] a few weeks ago.  However, because of his persistent symptoms,  Dr. Glennon Hamilton referred him for catheterization.   DESCRIPTION OF PROCEDURE:  Cardiac catheterization is done through the right  femoral artery.  The patient is fairly large.  We initially started it with  5-French catheters.  We had to upgrade to a 6-French system and used a JL-5  to engage the left main.   Left main coronary artery had 20% discrete stenosis.   The proximal LAD was heavily calcified.  There was 70% diffuse disease  proximally.  The mid LAD just at the take-off of the second diagonal branch  had a 70% eccentric lesion.  The first diagonal branch had 30-40% multiple  discrete lesions.   Circumflex coronary artery was normal proximally.  There was a 40-50%  proximal stenosis and a large first obtuse marginal branch.  The distal AV  groove branch was normal.   Right coronary artery was dominant.  There was an aneurysmal segment  proximally with an 80% discrete lesion just prior to it.  The distal vessel  had 30-40% multiple discrete lesions.   We could not cross the ventricle due to the patient's iliac tortuosity and  flimsiness of the catheters.  The patient has normal left ventricular  function by Cardiolite and echo.  Aortic root injection was performed.  The  aortic root itself was somewhat unfolded but not particularly enlarged.  He  had grade 3/4 angiographic aortic insufficiency.   IMPRESSION:  The patient will be referred to CVTS for possible two-vessel  coronary artery bypass grafting and AVR.  I will have our physicians review  the films.  However, the calcification in the proximal aorta branch point of  the second diagonal branch make the LAD lesions difficult to intervene on.  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  Also I think his aortic  insufficiency is at least angiographic grade 3/4 and he has a left  ventricular enlargement by echo.       PN/MEDQ  D:  12/11/2004  T:  12/11/2004  Job:  [id number]   cc:   Ramon Dredge L. Juanetta Gosling, M.D.  [address]  [place]  [place] [address]  Fax: [phone]   [facility] Cardiology Office   E. Graceann Congress, M.D.   Salvatore Decent. Cornelius Moras, M.D.  [address]  [place]  [place] [address]

## 2010-11-10 NOTE — Op Note (Signed)
NAME:  Terry Wu, Terry Wu NO.:  [id number]   MEDICAL RECORD NO.:  [id number]          PATIENT TYPE:  INP   LOCATION:  2306                         FACILITY:  [facility]   PHYSICIAN:  Kaylyn Layer. Michelle Piper, M.D.   DATE OF BIRTH:  October 06, 1931   DATE OF PROCEDURE:  DATE OF DISCHARGE:                                 OPERATIVE REPORT   Transesophageal echocardiogram probe placement and transesophageal  echocardiogram monitoring during coronary artery bypass grafting and aortic  valve replacement procedure per report, followed by Dr. Evelene Croon to  place the transesophageal echocardiogram probe into Terry Wu for  intraoperative monitoring of his open heart surgery. The patient has known  coronary artery disease as well as  aortic insufficiency and presented for  surgery today.   After adequate induction of anesthesia and endotracheal intubation, the  transesophageal echocardiogram probe was easily passed into the stomach.  Initial short axis view of the left ventricle showed normal left ventricle  with no wall motion abnormalities. Turning to the mitral valve in the four-  chamber view, the valve leaflets seem to coapt normally. The valve was  structurally normal. There was no fluttering in the anterior leaflet of the  mitral valve.  Color flow Doppler across the valve, I could see no mitral  regurgitation in all planes.  Left atrium was normal. Intra-atrial septum  was normal. Flow in the pulmonary veins was normal. Turning to the aortic  valve, it was trileaflet, and at end diastole, a triangular defect could be  seen between the two coronary cusps, probably contributing towards the  aortic insufficiency. Looking at the left ventricular outflow tract, there  was 3 to 4+ aortic insufficiency jet. Looking at the aortic valve in the  longitudinal view, a 3 to 4+ jet of aortic insufficiency could be seen in  diastole, traversing all the way across to the far ventricular wall.   There  was no fluttering of the anterior leaflet of the mitral valve, although the  valve could be seen to be somewhat stented. The diameter of the valve  measured 2.75 cm. There was supravalvular dilatation up to 3.3 cm.   The patient was placed on cardiopulmonary bypass by Dr. Laneta Simmers, underwent  coronary artery bypass grafting and aortic valve replacement. Immediately  prior to discontinuing cardiopulmonary bypass, an evaluation of intracardiac  air was done, and there was a minimal amount with minimal inotropes. The  patient was discontinued from cardiopulmonary bypass without problems. The  evaluation of the heart at this point showed left ventricle:  Short axis  view was unchanged from preoperative but no wall motion abnormalities, and  it was contracting well. The mitral valve was unchanged structurally;  however, on placing color flow Doppler across the valve due to differential  loading conditions, there was some trace to 1+ mitral regurgitation. Looking  at the aortic valve, the left ventricular outflow tract was normal. There  was no sign of aortic insufficiency. Looking at the valve in the  longitudinal view, the prosthetic valve could be seen to be functioning  normally, and there was a  small amount of a distal stenotic tear which was  not uncommon with presence of the prosthetic aortic valve. There was no sign  of aortic insufficiency. Right side of the heart was normal. Intra-atrial  septum was normal.   At the end of the procedure prior to transverse to the intensive care unit,  transesophageal echocardiogram was removed uneventfully, and the patient was  transferred in stable condition to the intensive care unit.      KDO/MEDQ  D:  12/14/2004  T:  12/14/2004  Job:  [id number]

## 2010-11-10 NOTE — Op Note (Signed)
NAME:  Terry Wu, Terry Wu                ACCOUNT NO.:  [id number]   MEDICAL RECORD NO.:  [id number]          PATIENT TYPE:  INP   LOCATION:  2306                         FACILITY:  [facility]   PHYSICIAN:  Evelene Croon, M.D.     DATE OF BIRTH:  August 27, 1931   DATE OF PROCEDURE:  12/14/2004  DATE OF DISCHARGE:                                 OPERATIVE REPORT   PREOPERATIVE DIAGNOSIS:  Severe three-vessel coronary disease and moderate  aortic insufficiency.   POSTOPERATIVE DIAGNOSIS:  Severe three-vessel coronary disease and moderate  aortic insufficiency.   OPERATIVE PROCEDURE:  A median sternotomy, extracorporeal circulation,  coronary bypass graft surgery x4 using a left internal mammary artery graft  to the left anterior descending coronary, with a saphenous vein graft to the  diagonal branch of the LAD, saphenous vein graft to the obtuse marginal  branch of the left circumflex coronary artery, and a saphenous vein graft to  the posterior descending branch of the right coronary artery. Aortic valve  replacement using a 25-mm Edwards pericardial MAGNA valve. Endoscopic vein  harvesting from the left leg.   ATTENDING SURGEON:  Evelene Croon, M.D.   ASSISTANT:  Stephanie Acre Dominick, PA-C.   ANESTHESIA:  General endotracheal.   CLINICAL HISTORY:  This patient is a 77 year old gentleman with history of  obesity and hypertension who recently presented with exertional chest  discomfort. A stress Cardiolite exam was negative for ischemia, but the  patient only exercised through stage I of the Bruce protocol. His symptoms  were fairly typical and it was felt that cardiac catheterization was the  best treatment. He developed rest angina several days before his cardiac  catheterization and required hospitalization while at the beach. Cardiac  catheterization here showed about 20% left main stenosis. The LAD had 70%  proximal and mid vessel stenosis. There is a large marginal branch that had  about  50% stenosis. The right coronary artery had about 80% mid vessel  stenosis. There is 3+ aortic insufficiency. Left ventricular function was  normal by echocardiogram. After review of the angiogram and examination of  the patient, it was felt that coronary bypass graft surgery and aortic valve  replacement was the best treatment to prevent further ischemia and  infarction and left ventricular dysfunction due to aortic insufficiency. I  discussed the operative procedure with the patient and his wife and son and  daughter including use a tissue valve given his age of 77 years. We  discussed the pros and cons of mechanical valve versus tissue valve and I  told them that I thought a tissue valve would be the best choice at his age.  He was not interested in being on Coumadin unless absolutely necessary and  therefore we decided that a tissue valve would be the best choice. I  discussed the benefits and risks of surgery including bleeding, blood  transfusion, infection, stroke, myocardial infarction, graft failure, and  death. He understood and agreed to proceed.   OPERATIVE PROCEDURE:  The patient was taken to the operating room and placed  on table in the supine position. After  induction of general endotracheal  anesthesia,  a Foley catheter is placed in the bladder using sterile  technique. Then the chest, abdomen and both lower extremities were prepped  and draped in usual sterile manner. The chest was entered through a median  sternotomy incision. The pericardium opened in the midline. Examination of  the heart showed good ventricular contractility. The ascending aorta had no  palpable plaques in it.   Then the left internal mammary artery was harvested from the chest was as a  pedicle graft. This is a medium caliber vessel with excellent blood flow  through it. At the same time, a segment of greater saphenous vein was  harvested from the left leg and this vein was a medium size and good   quality. This was harvested using endoscopic vein harvest technique.   A transesophageal echocardiogram was performed by anesthesiology at the  beginning of the procedure and showed 2 to 3+ aortic insufficiency. There  was no aortic stenosis. There was no mitral regurgitation and left  ventricular function appeared well-preserved.   Then the patient was heparinized and when an adequate activated clotting  time was achieved, the distal ascending aorta was cannulated using 20-French  aortic cannula for arterial inflow. Venous outflow was achieved using a two-  stage venous cannula for the right atrial appendage. An antegrade  cardioplegia and vent cannula was inserted in the aortic root.   A left ventricular vent was placed through the right superior pulmonary vein  and a retrograde cardioplegic cannula placed through a pursestring suture on  the right atrium into the coronary sinus.   Then the patient was placed on cardiopulmonary bypass and the distal  coronary was identified. The LAD was a medium size graftable vessel. The  diagonal branch was relatively small but graftable. The obtuse marginal  branch was diffusely diseased and became intramyocardial in its mid portion.  It was located within the muscle where it was a medium size graftable  vessel. The right coronary artery was diffusely diseased. The proximal  portion of the posterior descending artery had minimal disease. The  posterolateral branches were all small vessels.   Then the aorta was crossclamped and 500 mL of cold blood retrograde  cardioplegia was administered through the coronary sinus catheter. There  were slow arrest of the heart. Systemic hypothermia to 22 degrees centigrade  and topical hypothermia with iced saline was used. Temperature probe was  placed in the septum and insulating pad in the pericardium. Myocardial temperature was decreased to about 10 degrees centigrade.   Then the first distal anastomosis  was formed to the posterior descending  coronary artery. The internal diameter of this vessel was about 1.75 mm.  Conduit used was a segment of greater saphenous vein.  The anastomosis was  performed in end-to-side manner using continuous 7-0 Prolene suture. Flow  was measured through the graft and was excellent. Then a dose of  cardioplegia was given down this vein graft to cool the right side the  heart.   Then the second distal anastomosis was formed to the obtuse marginal branch.  The internal diameter of this vessel was also about 1.75 mm. The conduit  used was a second segment of greater saphenous vein and the anastomosis  performed in end-to-side manner using continuous 7-0 Prolene suture. Flow  was measured through the graft and was excellent. Then another dose of  retrograde cardioplegia was given as well as down the right coronary vein  graft.   The  third distal anastomosis was performed to the diagonal branch. The  internal diameter was 1.5 mm. The conduit used was a third segment of  greater saphenous vein.  The anastomosis was performed in end-to-side manner  using continuous 7-0 Prolene suture. Flow was measured through the graft and  was excellent.   The fourth distal anastomosis was formed to the mid portion of the left  anterior descending coronary artery. The internal diameter was 1.75 mm. The  conduit used was the left internal mammary graft and this was brought  through an opening in the left pericardium anterior to the phrenic nerve. It  was anastomosed to the LAD in end-to-side manner using continuous 8-0  Prolene suture. The pedicle was sutured to the epicardium with 6-0 Prolene  sutures. Then another dose of cardioplegia was given.   Then attention was turned to aortic valve replacement. The aorta was opened  transversely about 1 cm above the take off of the right coronary ostium.  Examination of the aortic valve showed that it was a three leaflet valve  with  marked irregularity, thickening, and shortening of the noncoronary cusp  resulting in insufficiency. The native valve was excised. Care was taken to  remove all debris. There was no calcium present. The annulus was sized and a  25-mm Edwards pericardial MAGNA valve was chosen. This was a model number  [id number], serial number [id number]. Then a series of pledgeted 2-0 Ethibond  horizontal mattress sutures were placed around the annulus with pledgets in  a subannular position. The sutures were placed through the sewing ring and  the valve lowered in place. The sutures were tied sequentially. The valve  seated nicely. The coronary ostia were identified and were not obstructed.  Then the patient was rewarmed to 37 degrees centigrade. The aortotomy was  closed in two-layers using continuous 4-0 Prolene suture with felt strips to reinforce the relatively thin aorta. Then, with the crossclamp in place, the  three proximal vein graft anastomoses were performed of the aortic root in  end-to-side manner continuous 6-0 Prolene suture. Then the head was placed  Trendelenburg position. The clamps removed from the mammary pedicle. There  was rapid warming of the ventricular septum and return of spontaneous  ventricular fibrillation. After de-airing maneuvers were performed, the  crossclamp removed with a time of 156 minutes. The patient was then  defibrillated in sinus rhythm.   The proximal and distal anastomoses appeared hemostatic and alignment of the  graft satisfactory. Graft markers were placed around the proximal  anastomosis. Two temporary right ventricular and right atrial pacing wires  were placed and brought out through the skin.   When the patient had rewarmed to 37 degrees centigrade, he was weaned from  cardiopulmonary bypass on no inotropic agents. Total bypass time was 187  minutes. Cardiac function appeared excellent with a cardiac output of 5  liters per minute. A transesophageal  echocardiogram showed normal  functioning prosthetic aortic valve. There was trivial mitral regurgitation.  Left ventricular function appeared well-preserved. There was no evidence of  perivalvular leak or aortic insufficiency. Then protamine was given and the  venous and aortic caths were removed without difficulty. Hemostasis was  achieved. Three chest tubes were placed with a tube in the posterior  pericardium, one in the left pleural space and one in the anterior  mediastinum. Pericardium was loosely reapproximated over the heart. The  sternum was closed with double #6 stainless steel wires. Fascia was closed  with continuous #1 Vicryl  suture. Subcutaneous tissue was closed with  continuous 2-0 Vicryl and skin with 3-0 Vicryl subcuticular closure. The  lower extremity vein harvest site was closed in layers in similar manner.  Sponge, needles and instrument counts were correct according to the scrub  nurse. Dry sterile dressings were applied over the incisions and around the  chest tubes which were hooked to Pleur-Evac suction. The patient remained  hemodynamically stable and was transported to the SICU in guarded but stable  condition.       BB/MEDQ  D:  12/14/2004  T:  12/14/2004  Job:  [id number]   cc:   Cardiac Cath Lab

## 2010-11-10 NOTE — Letter (Signed)
August 30, 2006    Ramon Dredge L. Juanetta Gosling, M.D.  [address]  [place], [place] [address]   RE:  Terry Wu, Terry Wu  MRN:  [id number]  /  DOB:  11-30-1931   Dear Renae Fickle,   It was a pleasure to see your nice patient, Terry Wu, for followup  on August 30, 2006, 21 months postop CABG and aortic valve replacement by  Dr. Evelene Croon. As you know, the patient has a history of hypertension  and hyperlipidemia for which he is being treated.   The patient has gotten along extremely well with no recurrent symptoms.  He remains active. He did have evaluation here in February prior to  cataract surgery.   MEDICATIONS:  1. Myocardia 40.  2. Plavix 75.  3. Norvasc 10.  4. Maxzide 37.5/25.  5. Lipitor 10.  6. Toprol XL 12.5 b.i.d.  7. Kay Ciel 20.   Apparently the patient recently had some hypokalemia with severe leg  cramps and this resolved with replacement of his potassium.   PHYSICAL EXAMINATION:  VITAL SIGNS:  Blood pressure 132/82, pulse 59,  normal sinus rhythm.  GENERAL:  Normal.  NECK:  JVP is not elevated. Carotid pulses palpable and equal without  bruits.  LUNGS:  Clear.  HEART:  Reveals normal sinus rhythm, 1/6 short systolic ejection murmur  at the aortic area  and no diastolic murmur.  ABDOMEN:  Unremarkable. Liver, spleen and kidney not palpable. No  masses.  EXTREMITIES:  Normal.   EKG reveals sinus bradycardia, first degree AV block, otherwise normal.   IMPRESSION:  1. Diagnosis as above.  2. The patient is doing extremely well 21 months post aortic valve      replacement for aortic regurgitation and CABG.   He had blood work this morning which I believe included BNP and lipids.   I have suggested that he followup with Dr. Charlton Haws, who did his  preop cath, in approximately 4 months. Thank you for the opportunity to  care for this very pleasant patient.    Sincerely,      E. Graceann Congress, MD, [facility]  Electronically Signed    EJL/MedQ  DD: 08/30/2006  DT:  08/30/2006  Job #: [id number]

## 2010-11-10 NOTE — Procedures (Signed)
NAME:  HUEL, CENTOLA                ACCOUNT NO.:  [id number]   MEDICAL RECORD NO.:  [id number]          PATIENT TYPE:  REC   LOCATION:  RAD                           FACILITY:  [facility]   PHYSICIAN:  [name], M.D.DATE OF BIRTH:  1931/11/08   DATE OF PROCEDURE:  DATE OF DISCHARGE:                                    STRESS TEST   INDICATIONS FOR PROCEDURE:  Chest tightness.   HISTORY:  Mr. Wessinger has been having episodes of chest tightness that are  generally related to exertion, particularly while he is using a lawnmower,  etc.  He has a history of hypertension.   Physical exam shows no contraindication graded exercise testing.   DESCRIPTION OF PROCEDURE:  Mr. Sieling exercised for 3 minutes and 31 seconds  on the Bruce protocol reaching and sustaining 4.6 METs.  He became fatigued  but had no chest tightness and no other specific symptoms.  Myoview was  injected per protocol.  His blood pressure response was somewhat  hypertensive and he was mildly hypertensive at rest.  He had no  electrocardiographic changes suggestive of inducible ischemia.   IMPRESSION:  1.  No electrocardiogram evidence of inducible ischemia.  2.  Resting hypertension with a hypertensive response to exercise.  3.  Poor exercise tolerance.  4.  Tiredness with exercise but no chest tightness.  5.  Myoview images are pending.      ELH/MEDQ  D:  10/26/2004  T:  10/26/2004  Job:  [id number]

## 2010-11-10 NOTE — Discharge Summary (Signed)
NAMERAMESSES, CRAMPTON                ACCOUNT NO.:  [id number]   MEDICAL RECORD NO.:  [id number]          PATIENT TYPE:  INP   LOCATION:  2016                         FACILITY:  [facility]   PHYSICIAN:  Evelene Croon, M.D.     DATE OF BIRTH:  Jan 22, 1932   DATE OF ADMISSION:  12/11/2004  DATE OF DISCHARGE:  12/19/2004                                 DISCHARGE SUMMARY   PREOPERATIVE DIAGNOSES:  1.  Severe three-vessel coronary artery disease.  2.  Aortic insufficiency.   IN-HOSPITAL DIAGNOSIS:  Acute blood loss anemia postoperatively.   SECONDARY DIAGNOSES:  1.  Hypertension.  2.  Status post bilateral knee replacements.  3.  Status post left thoracotomy with hiatal hernia repair by Dr. Edilia Bo.   ALLERGIES:  The patient is allergic to ASPIRIN, which causes swelling of the  lips, and DEMEROL.   IN-HOSPITAL OPERATIONS AND PROCEDURES:  1.  Cardiac catheterization.  2.  Coronary artery bypass grafting x4 with a left internal mammary artery      graft to the left anterior descending coronary, saphenous vein graft to      the diagonal branch of the left anterior descending, saphenous vein      graft to the obtuse marginal branch of the left circumflex coronary      artery, saphenous vein graft to the posterior descending branch of the      right coronary artery.  3.  Aortic valve replacement using 25 mm Edwards pericardial Magna valve.  4.  Endoscopic vein harvesting from the left leg.  5.  Transesophageal echocardiogram intraoperatively.   PATIENT'S HISTORY AND PHYSICAL AND HOSPITAL COURSE:  [name] is a 77-year-  old gentleman with recent onset of exertional anginal symptoms who had a  negative stress Cardiolite but exercised only to stage 1 of the Bruce  protocol.  He had an echocardiogram that showed mild to moderate aortic  insufficiency.  Due to his symptoms suggestive of exertional angina, he was  scheduled for cardiac catheterization.  He had gone to the beach a week ago,  but  he developed severe hypertension and chest pain at rest.  It appeared  that he spent the weekend at [facility] in [place], [place], for control of his blood pressure and anginal symptoms.  He was  then discharged and returned to [facility] without further symptoms.  Elective cardiac catheterization was done on December 11, 2004, and showed about  20% left main stenosis.  The LAD had 70% proximal and mid stenosis.  The  left circumflex had about 40-50% stenosis in the marginal branch.  The right  coronary had about 80% stenosis.  There was 3+ aortic insufficiency.  Left  ventricular function was normal by echocardiogram.  The patient was seen and  evaluated by Dr. Laneta Simmers.  Dr. Laneta Simmers discussed with the patient undergoing  coronary artery bypass grafting for his blockages and aortic valve  replacement due to the severe aortic insufficiency.  He discussed the risks  and benefits of this procedure.  The patient acknowledged understanding and  wished to proceed.  For details  of the patient's past medical history and  physical exam, please see dictated history and physical.   HOSPITAL COURSE:  [name] was taken to the operating room on December 14, 2004, where he underwent coronary artery bypass grafting x4 using a left  internal mammary artery graft to the left anterior descending coronary,  saphenous vein graft to diagonal branch of the LAD, saphenous vein graft to  obtuse marginal branch of the left circumflex, saphenous vein graft to the  posterior descending branch of the right coronary artery.  Noted aortic  valve replacement using a 25 mm Edwards pericardial Magna valve.  No  endoscopic vein harvesting from the left leg was done.  The patient  tolerated this procedure well and was transferred up to the intensive care  unit in stable condition.  The evening of surgery, the patient was  extubated.  He was alert and oriented x4.  He was hemodynamically stable the   evening of surgery.  Postoperative day 1, the patient continued to progress  well.  He was out of bed in chair.  Neuro was intact.  Chest tube and lines  were discontinued.  The patient was in normal sinus rhythm.  The patient was  transferred out to 2000 on postoperative day 1.  Postoperative day 2, the  patient was out of bed, ambulating well.  The incisions were dry and intact  and healing well.  His H&H dropped slightly to 7.9 and 23.2.  Chest x-ray  was stable.  Due to the acute blood loss anemia, the patient was started on  iron postoperatively.  Of note, the patient was asymptomatic from the low  blood count.  Postoperatively the patient did have elevated CBGs so was  continued on the sliding scale insulin.  This was monitored during his  hospital stay.  Of note, his hemoglobin A1c was 7.5 on admission.  The  patient will need to follow up at a later date with his primary care  physician.  The patient was started on Plavix for anticoagulation for  pericardial AVR.  Postoperative day 3, the patient was progressing well.  His H&H continued to drop ________ to 7.2 and 21.2.  He did receive one unit  of packed red blood cells.  He continues to progress well.  He is ambulating  well and remaining in normal sinus rhythm.  Incisions dry and intact and  healing well.  He did have volume overload and was continued on diuretics.  Postoperative day 4 the patient's H&H increased appropriately from the one  unit to 8.4 and 24.2.  His CBGs were better controlled.  He was continued on  sliding scale insulin.  Incisions remained dry and intact and healing well.  He remained in normal sinus rhythm.  The patient was discharged to home on  postoperative day 4 in stable condition.  He remained in normal sinus rhythm  at discharge.  A follow-up appointment was scheduled for Dr. Laneta Simmers on January 09, 2005, at 12 noon.  The patient will follow up with Dr. Corinda Gubler on December 28, 2004, at 10:30 a.m.  The patient  will obtain a PA and lateral chest x-ray  at this appointment and bring with him to Dr. Sharee Pimple appointment.  Mr.  Terry Wu received instructions on diet, activity level and incisional care.  He was told no driving until released to do so, no heavy lifting over 10  pounds.  He is told he is allowed to shower, washing his incisions using  soap and water.  He is to contact the office if he develops any drainage or  opening from any of his incision sites.  The patient is to ambulate three to  four times per day, to progress as tolerated.  Continue his breathing  exercises.  He is to stay on a low-fat, low-salt diet.  He acknowledges  understanding.   DISCHARGE MEDICATIONS:  1.  Lopressor 25 mg p.o. b.i.d.  2.  Micardis 40 mg p.o. daily.  3.  Lipitor 10 mg p.o. q.h.s.  4.  Lasix 40 mg p.o. daily x5 days.  5.  Potassium chloride 20 mEq p.o. daily x5 days.  6.  Niferex 150 mg p.o. b.i.d.  7.  Plavix 75 mg p.o. daily.  8.  Tylox one to two tablets p.o. q.4-6h. p.r.n. pain.     KMD/MEDQ  D:  12/18/2004  T:  12/18/2004  Job:  [id number]

## 2010-11-28 ENCOUNTER — Other Ambulatory Visit: Payer: Self-pay | Admitting: Cardiovascular Disease

## 2010-11-30 ENCOUNTER — Other Ambulatory Visit (HOSPITAL_COMMUNITY): Payer: Self-pay | Admitting: Pulmonary Disease

## 2010-11-30 DIAGNOSIS — I714 Abdominal aortic aneurysm, without rupture: Secondary | ICD-10-CM

## 2010-12-04 ENCOUNTER — Ambulatory Visit (HOSPITAL_COMMUNITY)
Admission: RE | Admit: 2010-12-04 | Discharge: 2010-12-04 | Disposition: A | Discharge status: Home / Self Care | Payer: Medicare Other | Source: Ambulatory Visit | Attending: Pulmonary Disease | Admitting: Pulmonary Disease

## 2010-12-04 DIAGNOSIS — K8020 Calculus of gallbladder without cholecystitis without obstruction: Secondary | ICD-10-CM | POA: Insufficient documentation

## 2010-12-04 DIAGNOSIS — I714 Abdominal aortic aneurysm, without rupture, unspecified: Secondary | ICD-10-CM | POA: Insufficient documentation

## 2010-12-04 DIAGNOSIS — K5710 Diverticulosis of small intestine without perforation or abscess without bleeding: Secondary | ICD-10-CM | POA: Insufficient documentation

## 2010-12-04 MED ORDER — IOHEXOL 300 MG/ML  SOLN
100.0000 mL | Freq: Once | INTRAMUSCULAR | Status: AC | PRN
  Administered 2010-12-04: 100 mL via INTRAVENOUS

## 2011-01-05 ENCOUNTER — Telehealth: Payer: Self-pay | Admitting: Cardiovascular Disease

## 2011-01-05 NOTE — Telephone Encounter (Signed)
Per pt call, pt has scheduled an appt for Friday, Oct. 5 with Dr. Eden Emms and pt would like to know if Dr. Eden Emms would like pt to have blood work done prior to appt. Please return pt call to advise.

## 2011-01-05 NOTE — Telephone Encounter (Signed)
Spoke with pt, his cholesterol is followed by his primary md. He will bring a copy to his appt, otherwise he does not need any labs prior to the appt Deliah Goody

## 2011-01-31 ENCOUNTER — Other Ambulatory Visit: Payer: Self-pay | Admitting: Cardiovascular Disease

## 2011-03-20 LAB — COMPREHENSIVE METABOLIC PANEL
Albumin: 4
Alkaline Phosphatase: 24 — ABNORMAL LOW
BUN: 15
Calcium: 9.7
Creatinine, Ser: 0.82
Glucose, Bld: 100 — ABNORMAL HIGH
Potassium: 4.7
Total Protein: 6.7

## 2011-03-20 LAB — CBC
HCT: 40
Hemoglobin: 13.7
MCHC: 34.3
Platelets: 207
RDW: 12.7

## 2011-03-29 ENCOUNTER — Encounter: Payer: Self-pay | Admitting: Cardiovascular Disease

## 2011-03-29 ENCOUNTER — Encounter: Payer: Self-pay | Admitting: *Deleted

## 2011-03-30 ENCOUNTER — Encounter: Payer: Self-pay | Admitting: Cardiovascular Disease

## 2011-03-30 ENCOUNTER — Ambulatory Visit (INDEPENDENT_AMBULATORY_CARE_PROVIDER_SITE_OTHER): Payer: Medicare Other | Admitting: Cardiovascular Disease

## 2011-03-30 DIAGNOSIS — Z952 Presence of prosthetic heart valve: Secondary | ICD-10-CM

## 2011-03-30 DIAGNOSIS — Z951 Presence of aortocoronary bypass graft: Secondary | ICD-10-CM

## 2011-03-30 DIAGNOSIS — R079 Chest pain, unspecified: Secondary | ICD-10-CM

## 2011-03-30 DIAGNOSIS — E782 Mixed hyperlipidemia: Secondary | ICD-10-CM

## 2011-03-30 DIAGNOSIS — R0602 Shortness of breath: Secondary | ICD-10-CM

## 2011-03-30 DIAGNOSIS — I10 Essential (primary) hypertension: Secondary | ICD-10-CM

## 2011-03-30 DIAGNOSIS — Z954 Presence of other heart-valve replacement: Secondary | ICD-10-CM

## 2011-03-30 DIAGNOSIS — E119 Type 2 diabetes mellitus without complications: Secondary | ICD-10-CM

## 2011-03-30 DIAGNOSIS — I359 Nonrheumatic aortic valve disorder, unspecified: Secondary | ICD-10-CM

## 2011-03-30 NOTE — Assessment & Plan Note (Signed)
Well controlled.  Continue current medications and low sodium Dash type diet.    

## 2011-03-30 NOTE — Assessment & Plan Note (Signed)
Cholesterol is at goal.  Continue current dose of statin and diet Rx.  No myalgias or side effects.  F/U  LFT's in 6 months. No results found for this basename: LDLCALC             

## 2011-03-30 NOTE — Assessment & Plan Note (Signed)
A1c 6.8 Still needs some work on low cab diet

## 2011-03-30 NOTE — Patient Instructions (Signed)
Your physician wants you to follow-up in: 6 MONTHS You will receive a reminder letter in the mail two months in advance. If you don't receive a letter, please call our office to schedule the follow-up appointment.   Your physician has requested that you have en exercise stress myoview. For further information please visit [email or web address] Please follow instruction sheet, as given.

## 2011-03-30 NOTE — Assessment & Plan Note (Signed)
Normal exam and echo ok in 2010  SBE prophylaxis

## 2011-03-30 NOTE — Assessment & Plan Note (Signed)
Concerning new exertional symptoms  Exercise myovue

## 2011-03-30 NOTE — Progress Notes (Signed)
Ira has a history of coronary bypass surgery with tissue aortic valve replacement 2006. His been doing well. F/U echo 10/2008 showed normal LV funtion and AVR with no perivalvular leak. [name] He is complaining of some fatigue and shortness of breath. He continues to have issues with dizzyness. I reviewed his cardiac rehab numbers and HR and BP are fine. He is only doing 3.9 mets and activity limited by knee pain. His dizzyness has a postural componant on occasion but his BP readings from home and rehab are fine and he has never been postural in the office. We discussed f/u with Dr Shiela Mayer and stopping his uroxatrol. He is on this for frequency He is a nonsmoker and has no history of lung disease. Denies any significant chest pain PND or orthopnea. Has been no previous heart failure.  He will F/U with Dr Juanetta Gosling and his ENT doctor for further w/u of his dizzyness.   Has exertional chest pressure the las 3 months.  Reproduceable at over .  The eases off .  Concerning for angina  ROS: Denies fever, malais, weight loss, blurry vision, decreased visual acuity, cough, sputum, SOB, hemoptysis, pleuritic pain, palpitaitons, heartburn, abdominal pain, melena, lower extremity edema, claudication, or rash.  All other systems reviewed and negative  General: Affect appropriate Healthy:  appears stated age HEENT: normal Neck supple with no adenopathy JVP normal no bruits no thyromegaly Lungs clear with no wheezing and good diaphragmatic motion Heart:  S1/S2 no murmur,rub, gallop or click PMI normal Abdomen: benighn, BS positve, no tenderness, no AAA no bruit.  No HSM or HJR Distal pulses intact with no bruits No edema Neuro non-focal Skin warm and dry No muscular weakness   Current Outpatient Prescriptions  Medication Sig Dispense Refill  . amLODipine (NORVASC) 10 MG tablet Take 10 mg by mouth daily.        . carvedilol (COREG) 12.5 MG tablet TAKE 1 TABLET BY MOUTH TWICE DAILY  60 tablet  6  .  clopidogrel (PLAVIX) 75 MG tablet Take 1 tablet (75 mg total) by mouth daily.  30 tablet  11  . glimepiride (AMARYL) 1 MG tablet Take 1 mg by mouth daily before breakfast.        . Multiple Vitamins-Minerals (ICAPS PO) Take 1 tablet by mouth daily.        . pantoprazole (PROTONIX) 40 MG tablet Take 40 mg by mouth daily.        . potassium chloride SA (K-DUR,KLOR-CON) 20 MEQ tablet 2 tabs po qd       . rosuvastatin (CRESTOR) 5 MG tablet Take 5 mg by mouth daily.        [name] telmisartan (MICARDIS) 40 MG tablet          Allergies  Aspirin and Demerol  Electrocardiogram:  SB 56 PR 210 nonspecific ST/T wave changes in lateral leads  Assessment and Plan

## 2011-04-06 ENCOUNTER — Other Ambulatory Visit: Payer: Self-pay | Admitting: Cardiovascular Disease

## 2011-05-03 ENCOUNTER — Ambulatory Visit (HOSPITAL_COMMUNITY): Payer: Medicare Other | Attending: Cardiovascular Disease | Admitting: Radiology

## 2011-05-03 DIAGNOSIS — E119 Type 2 diabetes mellitus without complications: Secondary | ICD-10-CM | POA: Insufficient documentation

## 2011-05-03 DIAGNOSIS — Z8249 Family history of ischemic heart disease and other diseases of the circulatory system: Secondary | ICD-10-CM | POA: Insufficient documentation

## 2011-05-03 DIAGNOSIS — I491 Atrial premature depolarization: Secondary | ICD-10-CM

## 2011-05-03 DIAGNOSIS — R0602 Shortness of breath: Secondary | ICD-10-CM

## 2011-05-03 DIAGNOSIS — E785 Hyperlipidemia, unspecified: Secondary | ICD-10-CM | POA: Insufficient documentation

## 2011-05-03 DIAGNOSIS — I2510 Atherosclerotic heart disease of native coronary artery without angina pectoris: Secondary | ICD-10-CM

## 2011-05-03 DIAGNOSIS — Z952 Presence of prosthetic heart valve: Secondary | ICD-10-CM

## 2011-05-03 DIAGNOSIS — I25810 Atherosclerosis of coronary artery bypass graft(s) without angina pectoris: Secondary | ICD-10-CM

## 2011-05-03 DIAGNOSIS — I10 Essential (primary) hypertension: Secondary | ICD-10-CM | POA: Insufficient documentation

## 2011-05-03 DIAGNOSIS — Z951 Presence of aortocoronary bypass graft: Secondary | ICD-10-CM | POA: Insufficient documentation

## 2011-05-03 DIAGNOSIS — R42 Dizziness and giddiness: Secondary | ICD-10-CM | POA: Insufficient documentation

## 2011-05-03 DIAGNOSIS — R0789 Other chest pain: Secondary | ICD-10-CM | POA: Insufficient documentation

## 2011-05-03 MED ORDER — REGADENOSON 0.4 MG/5ML IV SOLN
0.4000 mg | Freq: Once | INTRAVENOUS | Status: AC
  Administered 2011-05-03: 0.4 mg via INTRAVENOUS

## 2011-05-03 MED ORDER — TECHNETIUM TC 99M TETROFOSMIN IV KIT
33.0000 | PACK | Freq: Once | INTRAVENOUS | Status: AC | PRN
  Administered 2011-05-03: 33 via INTRAVENOUS

## 2011-05-03 MED ORDER — TECHNETIUM TC 99M TETROFOSMIN IV KIT
11.0000 | PACK | Freq: Once | INTRAVENOUS | Status: AC | PRN
  Administered 2011-05-03: 11 via INTRAVENOUS

## 2011-05-03 NOTE — Progress Notes (Signed)
[facility] SITE 3 NUCLEAR MED [address] [place] [address] [phone]  Cardiology Nuclear Med Study  Terry Wu is a 77 y.o. male [id number] 02/04/1932   Nuclear Med Background Indication for Stress Test:  Evaluation for Ischemia and Graft Patency History: 06/06 CABG:x4, 05/10 Echo:EF 55-60% NL , 06/06 Heart Catheterization: -CABG and 10/27/04 Myocardial Perfusion Study: EF 59% NL Cardiac Risk Factors: Family History - CAD, Hypertension, Lipids and NIDDM  Symptoms:  Chest Pressure, and Dizziness   Nuclear Pre-Procedure Caffeine/Decaff Intake:  None NPO After: 11:00pm   Lungs: clear IV 0.9% NS with Angio Cath:  22g  IV Site: R Hand x 1, tolerated well IV Started by:  Irean Hong, RN  Chest Size (in):  48 Cup Size: n/a  Height: 5\' 9"  (1.753 m)  Weight:  230 lb (104.327 kg)  BMI:  Body mass index is 33.96 kg/(m^2). Tech Comments:  Coreg this am per MD    Nuclear Med Study 1 or 2 day study: 1 day  Stress Test Type:  Treadmill/Lexiscan  Reading MD: Dietrich Pates, MD  Order Authorizing Provider:  Charlton Haws, MD  Resting Radionuclide: Technetium 70m Tetrofosmin  Resting Radionuclide Dose: 11.0 mCi   Stress Radionuclide:  Technetium 48m Tetrofosmin  Stress Radionuclide Dose: 33.0 mCi           Stress Protocol Rest HR: 58 Stress HR: 85  Rest BP: 154/87 Stress BP: 152/69  Exercise Time (min): 5:33 METS: 4.60   Predicted Max HR: 143 bpm % Max HR: 59.44 bpm Rate Pressure Product: 42595   Dose of Adenosine (mg):  n/a Dose of Lexiscan: 0.4 mg  Dose of Atropine (mg): n/a Dose of Dobutamine: n/a mcg/kg/min (at max HR)  Stress Test Technologist: Milana Na, EMT-P  Nuclear Technologist:  Domenic Polite, CNMT     Rest Procedure:  Myocardial perfusion imaging was performed at rest 45 minutes following the intravenous administration of Technetium 56m Tetrofosmin. Rest ECG: NSR  Stress Procedure:  The patient received IV Lexiscan 0.4 mg over  15-seconds with concurrent low level exercise and then Technetium 59m Tetrofosmin was injected at 30-seconds while the patient continued walking one more minute.  There were non specific changes, sob, and occ pacs with Lexiscan.  Quantitative spect images were obtained after a 45-minute delay. Stress ECG: No significant change from baseline ECG and No significant ST segment change suggestive of ischemia.  QPS Raw Data Images:  Normal; no motion artifact; normal heart/lung ratio. Stress Images:  Normal homogeneous uptake in all areas of the myocardium. Rest Images:  Normal homogeneous uptake in all areas of the myocardium. Subtraction (SDS):  No evidence of ischemia. Transient Ischemic Dilatation (Normal <1.22):  1.05 Lung/Heart Ratio (Normal <0.45):  0.28  Quantitative Gated Spect Images QGS EDV:  93 ml QGS ESV:  28 ml QGS cine images:  NL LV Function; NL Wall Motion QGS EF: 70%  Impression Exercise Capacity:  Fair exercise capacity. BP Response:  Normal blood pressure response. Clinical Symptoms:  No chest pain. ECG Impression:  No significant ST segment change suggestive of ischemia. Comparison with Prior Nuclear Study:  No significant change from previous report.  Overall Impression:  Normal stress nuclear study. LVEF 70% Dietrich Pates     .

## 2011-05-23 ENCOUNTER — Telehealth: Payer: Self-pay | Admitting: Cardiovascular Disease

## 2011-05-23 NOTE — Telephone Encounter (Signed)
Stress,12 Lead faxed to [facility] @ [phone]  05/23/11/km

## 2011-06-11 ENCOUNTER — Other Ambulatory Visit: Payer: Self-pay | Admitting: Cardiovascular Disease

## 2011-07-09 ENCOUNTER — Other Ambulatory Visit: Payer: Self-pay | Admitting: Cardiovascular Disease

## 2011-07-09 ENCOUNTER — Other Ambulatory Visit: Payer: Self-pay

## 2011-07-09 MED ORDER — AMLODIPINE BESYLATE 10 MG PO TABS: 10.0000 mg | ORAL_TABLET | Freq: Every day | ORAL | Status: DC

## 2011-08-03 DIAGNOSIS — E785 Hyperlipidemia, unspecified: Secondary | ICD-10-CM | POA: Diagnosis not present

## 2011-08-03 DIAGNOSIS — R5381 Other malaise: Secondary | ICD-10-CM | POA: Diagnosis not present

## 2011-08-03 DIAGNOSIS — I2510 Atherosclerotic heart disease of native coronary artery without angina pectoris: Secondary | ICD-10-CM | POA: Diagnosis not present

## 2011-08-03 DIAGNOSIS — I10 Essential (primary) hypertension: Secondary | ICD-10-CM | POA: Diagnosis not present

## 2011-08-03 DIAGNOSIS — R5383 Other fatigue: Secondary | ICD-10-CM | POA: Diagnosis not present

## 2011-08-06 ENCOUNTER — Encounter: Payer: Self-pay | Admitting: Cardiovascular Disease

## 2011-08-06 DIAGNOSIS — R5381 Other malaise: Secondary | ICD-10-CM | POA: Diagnosis not present

## 2011-08-06 DIAGNOSIS — M1990 Unspecified osteoarthritis, unspecified site: Secondary | ICD-10-CM | POA: Diagnosis not present

## 2011-08-06 DIAGNOSIS — H409 Unspecified glaucoma: Secondary | ICD-10-CM | POA: Diagnosis not present

## 2011-08-06 DIAGNOSIS — R5383 Other fatigue: Secondary | ICD-10-CM | POA: Diagnosis not present

## 2011-08-06 DIAGNOSIS — I10 Essential (primary) hypertension: Secondary | ICD-10-CM | POA: Diagnosis not present

## 2011-08-06 DIAGNOSIS — IMO0001 Reserved for inherently not codable concepts without codable children: Secondary | ICD-10-CM | POA: Diagnosis not present

## 2011-08-06 DIAGNOSIS — H4011X0 Primary open-angle glaucoma, stage unspecified: Secondary | ICD-10-CM | POA: Diagnosis not present

## 2011-08-06 DIAGNOSIS — E785 Hyperlipidemia, unspecified: Secondary | ICD-10-CM | POA: Diagnosis not present

## 2011-08-27 DIAGNOSIS — Z8546 Personal history of malignant neoplasm of prostate: Secondary | ICD-10-CM | POA: Diagnosis not present

## 2011-09-03 DIAGNOSIS — Z8546 Personal history of malignant neoplasm of prostate: Secondary | ICD-10-CM | POA: Diagnosis not present

## 2011-09-18 ENCOUNTER — Other Ambulatory Visit: Payer: Self-pay | Admitting: Cardiovascular Disease

## 2011-10-02 ENCOUNTER — Ambulatory Visit (INDEPENDENT_AMBULATORY_CARE_PROVIDER_SITE_OTHER): Payer: Medicare Other | Admitting: Cardiovascular Disease

## 2011-10-02 ENCOUNTER — Encounter: Payer: Self-pay | Admitting: Cardiovascular Disease

## 2011-10-02 VITALS — BP 154/84 | HR 68 | Resp 16 | Ht 69.0 in | Wt 236.0 lb

## 2011-10-02 DIAGNOSIS — E119 Type 2 diabetes mellitus without complications: Secondary | ICD-10-CM

## 2011-10-02 DIAGNOSIS — Z954 Presence of other heart-valve replacement: Secondary | ICD-10-CM

## 2011-10-02 DIAGNOSIS — E782 Mixed hyperlipidemia: Secondary | ICD-10-CM

## 2011-10-02 DIAGNOSIS — I10 Essential (primary) hypertension: Secondary | ICD-10-CM

## 2011-10-02 NOTE — Assessment & Plan Note (Signed)
Well controlled.  Continue current medications and low sodium Dash type diet.    

## 2011-10-02 NOTE — Assessment & Plan Note (Signed)
Cholesterol is at goal.  Continue current dose of statin and diet Rx.  No myalgias or side effects.  F/U  LFT's in 6 months. No results found for this basename: LDLCALC             

## 2011-10-02 NOTE — Patient Instructions (Signed)
Your physician wants you to follow-up in:  6 MONTHS WITH DR NISHAN  You will receive a reminder letter in the mail two months in advance. If you don't receive a letter, please call our office to schedule the follow-up appointment. Your physician recommends that you continue on your current medications as directed. Please refer to the Current Medication list given to you today. 

## 2011-10-02 NOTE — Progress Notes (Signed)
Patient ID: Terry Wu, male   DOB: Jan 13, 1932, 78 y.o.   MRN: [id number] Forrest has a history of coronary bypass surgery with tissue aortic valve replacement 2006. His been doing well. F/U echo 10/2008 showed normal LV funtion and AVR with no perivalvular leak. [name] He is complaining of some fatigue and shortness of breath. He continues to have issues with dizzyness. I reviewed his cardiac rehab numbers and HR and BP are fine. He is only doing 3.9 mets and activity limited by knee pain. His dizzyness has a postural componant on occasion but his BP readings from home and rehab are fine and he has never been postural in the office. We discussed f/u with Dr Shiela Mayer and stopping his uroxatrol. He is on this for frequency He is a nonsmoker and has no history of lung disease. Denies any significant chest pain PND or orthopnea. Has been no previous heart failure.  He will F/U with Dr Juanetta Gosling and his ENT doctor for further w/u of his dizzyness.  Has exertional chest pressure the las 3 months. Reproduceable at over . The eases off . Concerning for angina  05/03/11 normal nuclear study EF 70%  No further SSCP  Only fatigue  Golfing and going to rehab 3x/week.  Enjoys watching grandson play baseball.    . ROS: Denies fever, malais, weight loss, blurry vision, decreased visual acuity, cough, sputum, SOB, hemoptysis, pleuritic pain, palpitaitons, heartburn, abdominal pain, melena, lower extremity edema, claudication, or rash.  All other systems reviewed and negative  General: Affect appropriate Healthy:  appears stated age HEENT: normal Neck supple with no adenopathy JVP normal no bruits no thyromegaly Lungs clear with no wheezing and good diaphragmatic motion Heart:  S1/S2 SEM murmur, no rub, gallop or click PMI normal Abdomen: benighn, BS positve, no tenderness, no AAA no bruit.  No HSM or HJR Distal pulses intact with no bruits No edema Neuro non-focal Skin warm and dry No muscular  weakness   Current Outpatient Prescriptions  Medication Sig Dispense Refill  . acetaminophen (TYLENOL) 500 MG tablet Take 500 mg by mouth every 6 (six) hours as needed.      [name] amLODipine (NORVASC) 10 MG tablet Take 1 tablet (10 mg total) by mouth daily.  30 tablet  4  . amoxicillin (AMOXIL) 500 MG tablet Take 2,000 mg prior to dentist appt      . carvedilol (COREG) 12.5 MG tablet TAKE ONE TABLET BY MOUTH TWICE DAILY  60 tablet  12  . clopidogrel (PLAVIX) 75 MG tablet 1 tab daily      . CRESTOR 5 MG tablet TAKE ONE TABLET BY MOUTH EVERY OTHER DAY  30 each  12  . glimepiride (AMARYL) 2 MG tablet Take 1/2 tab daily      . latanoprost (XALATAN) 0.005 % ophthalmic solution 1 drop each night      . Multiple Vitamins-Minerals (ICAPS PO) Take 1 tablet by mouth daily.        . pantoprazole (PROTONIX) 40 MG tablet TAKE 1 TABLET BY MOUTH EVERY DAY  30 tablet  12  . potassium chloride SA (K-DUR,KLOR-CON) 20 MEQ tablet 2 tabs po qd       . telmisartan (MICARDIS) 80 MG tablet 1/2 tab daily      . triamterene-hydrochlorothiazide (MAXZIDE-25) 37.5-25 MG per tablet 1 tab daily        Allergies  Aspirin and Demerol  Electrocardiogram:  Assessment and Plan

## 2011-10-02 NOTE — Assessment & Plan Note (Signed)
Discussed low carb diet.  Target hemoglobin A1c is 6.5 or less.  Continue current medications. Reviewed labs from [facility]  A1c 6.1

## 2011-10-02 NOTE — Assessment & Plan Note (Signed)
Normal on exam no AR  F/U echo as needed  SBE prophylaxis

## 2011-10-18 ENCOUNTER — Other Ambulatory Visit: Payer: Self-pay | Admitting: Cardiovascular Disease

## 2011-11-02 DIAGNOSIS — I10 Essential (primary) hypertension: Secondary | ICD-10-CM | POA: Diagnosis not present

## 2011-11-02 DIAGNOSIS — E785 Hyperlipidemia, unspecified: Secondary | ICD-10-CM | POA: Diagnosis not present

## 2011-11-02 DIAGNOSIS — I2510 Atherosclerotic heart disease of native coronary artery without angina pectoris: Secondary | ICD-10-CM | POA: Diagnosis not present

## 2011-11-05 DIAGNOSIS — H4011X0 Primary open-angle glaucoma, stage unspecified: Secondary | ICD-10-CM | POA: Diagnosis not present

## 2011-11-05 DIAGNOSIS — H409 Unspecified glaucoma: Secondary | ICD-10-CM | POA: Diagnosis not present

## 2011-12-18 ENCOUNTER — Other Ambulatory Visit: Payer: Self-pay | Admitting: Cardiovascular Disease

## 2012-02-04 DIAGNOSIS — E109 Type 1 diabetes mellitus without complications: Secondary | ICD-10-CM | POA: Diagnosis not present

## 2012-02-04 DIAGNOSIS — E119 Type 2 diabetes mellitus without complications: Secondary | ICD-10-CM | POA: Diagnosis not present

## 2012-02-04 DIAGNOSIS — M1990 Unspecified osteoarthritis, unspecified site: Secondary | ICD-10-CM | POA: Diagnosis not present

## 2012-02-04 DIAGNOSIS — H353190 Nonexudative age-related macular degeneration, unspecified eye, stage unspecified: Secondary | ICD-10-CM | POA: Diagnosis not present

## 2012-02-04 DIAGNOSIS — H409 Unspecified glaucoma: Secondary | ICD-10-CM | POA: Diagnosis not present

## 2012-02-04 DIAGNOSIS — I10 Essential (primary) hypertension: Secondary | ICD-10-CM | POA: Diagnosis not present

## 2012-02-04 DIAGNOSIS — E785 Hyperlipidemia, unspecified: Secondary | ICD-10-CM | POA: Diagnosis not present

## 2012-02-04 DIAGNOSIS — Z961 Presence of intraocular lens: Secondary | ICD-10-CM | POA: Diagnosis not present

## 2012-02-04 DIAGNOSIS — H4011X0 Primary open-angle glaucoma, stage unspecified: Secondary | ICD-10-CM | POA: Diagnosis not present

## 2012-02-19 ENCOUNTER — Telehealth: Payer: Self-pay | Admitting: Cardiovascular Disease

## 2012-02-19 NOTE — Telephone Encounter (Signed)
To Christine York. 

## 2012-02-19 NOTE — Telephone Encounter (Signed)
Doe pt need lab work at visit, if so can we mail him written order to have down closer to home

## 2012-02-21 NOTE — Telephone Encounter (Signed)
SPOKE WITH PT  AFTER REVIEWING PT'S  RECORDS  LABS WERE SCANNED  INTO PT'S RECORDS FROM FEB 2013  PT AWARE LABS SHOULD BE CHECKED AT LEAST YEARLY  AND CAN DISCUSS WITH DR Eden Emms  AT OCT  2013 VISIT IF NEEDS TO BE REPEATED PT AGREES./CY

## 2012-03-28 ENCOUNTER — Ambulatory Visit (INDEPENDENT_AMBULATORY_CARE_PROVIDER_SITE_OTHER): Payer: Medicare Other | Admitting: Cardiovascular Disease

## 2012-03-28 ENCOUNTER — Encounter: Payer: Self-pay | Admitting: Cardiovascular Disease

## 2012-03-28 VITALS — BP 153/74 | HR 63 | Ht 69.0 in | Wt 230.0 lb

## 2012-03-28 DIAGNOSIS — I10 Essential (primary) hypertension: Secondary | ICD-10-CM

## 2012-03-28 DIAGNOSIS — Z951 Presence of aortocoronary bypass graft: Secondary | ICD-10-CM | POA: Diagnosis not present

## 2012-03-28 DIAGNOSIS — E782 Mixed hyperlipidemia: Secondary | ICD-10-CM

## 2012-03-28 DIAGNOSIS — Z954 Presence of other heart-valve replacement: Secondary | ICD-10-CM

## 2012-03-28 DIAGNOSIS — E119 Type 2 diabetes mellitus without complications: Secondary | ICD-10-CM

## 2012-03-28 DIAGNOSIS — I359 Nonrheumatic aortic valve disorder, unspecified: Secondary | ICD-10-CM

## 2012-03-28 NOTE — Patient Instructions (Addendum)
Your physician recommends that you continue on your current medications as directed. Please refer to the Current Medication list given to you today.  Your physician wants you to follow-up in: 1 year with Dr. Nishan. You will receive a reminder letter in the mail two months in advance. If you don't receive a letter, please call our office to schedule the follow-up appointment.  

## 2012-03-28 NOTE — Assessment & Plan Note (Signed)
Discussed low carb diet.  Target hemoglobin A1c is 6.5 or less.  Continue current medications.  

## 2012-03-28 NOTE — Assessment & Plan Note (Signed)
A low fat, low cholesterol is discussed with the patient, and a written copy is given to him.

## 2012-03-28 NOTE — Assessment & Plan Note (Signed)
No change in murmur Normal function by echo SBE

## 2012-03-28 NOTE — Assessment & Plan Note (Signed)
Well controlled.  Continue current medications and low sodium Dash type diet.    

## 2012-03-28 NOTE — Progress Notes (Signed)
Patient ID: Terry Wu, male   DOB: March 31, 1932, 78 y.o.   MRN: [id number] Devone has a history of coronary bypass surgery with tissue aortic valve replacement 2006. His been doing well. F/U echo 10/2008 showed normal LV funtion and AVR with no perivalvular leak. [name] He is complaining of some fatigue and shortness of breath. He continues to have issues with dizzyness. I reviewed his cardiac rehab numbers and HR and BP are fine. He is only doing 3.9 mets and activity limited by knee pain. His dizzyness has a postural componant on occasion but his BP readings from home and rehab are fine and he has never been postural in the office. We discussed f/u with Dr Shiela Mayer and stopping his uroxatrol. He is on this for frequency He is a nonsmoker and has no history of lung disease. Denies any significant chest pain PND or orthopnea. Has been no previous heart failure.  Diabetes well controlled A1c 5.8   05/03/11 normal nuclear study EF 70%  No further SSCP Only fatigue Golfing and going to rehab 3x/week. Enjoys watching grandson play baseball.   ROS: Denies fever, malais, weight loss, blurry vision, decreased visual acuity, cough, sputum, SOB, hemoptysis, pleuritic pain, palpitaitons, heartburn, abdominal pain, melena, lower extremity edema, claudication, or rash.  All other systems reviewed and negative  General: Affect appropriate Healthy:  appears stated age HEENT: normal Neck supple with no adenopathy JVP normal no bruits no thyromegaly Lungs clear with no wheezing and good diaphragmatic motion Heart:  S1/S2 SEM  murmur, no rub, gallop or click PMI normal Abdomen: benighn, BS positve, no tenderness, no AAA no bruit.  No HSM or HJR Distal pulses intact with no bruits No edema Neuro non-focal Skin warm and dry No muscular weakness   Current Outpatient Prescriptions  Medication Sig Dispense Refill  . acetaminophen (TYLENOL) 500 MG tablet Take 500 mg by mouth every 6 (six) hours as needed.      [name]  amLODipine (NORVASC) 10 MG tablet TAKE ONE TABLET BY MOUTH EVERY DAY  30 tablet  12  . carvedilol (COREG) 12.5 MG tablet TAKE ONE TABLET BY MOUTH TWICE DAILY  60 tablet  12  . CRESTOR 5 MG tablet TAKE ONE TABLET BY MOUTH EVERY OTHER DAY  30 each  12  . glimepiride (AMARYL) 2 MG tablet Take 1/2 tab daily      . latanoprost (XALATAN) 0.005 % ophthalmic solution 1 drop each night      . Multiple Vitamins-Minerals (ICAPS PO) Take 2 tablets by mouth 3 (three) times daily after meals.       . pantoprazole (PROTONIX) 40 MG tablet TAKE 1 TABLET BY MOUTH EVERY DAY  30 tablet  12  . PLAVIX 75 MG tablet TAKE ONE TABLET BY MOUTH EVERY DAY  30 each  12  . potassium chloride SA (K-DUR,KLOR-CON) 20 MEQ tablet 2 tabs po qd       . telmisartan (MICARDIS) 80 MG tablet 1/2 tab daily      . amoxicillin (AMOXIL) 500 MG tablet Take 2,000 mg prior to dentist appt        Allergies  Aspirin and Demerol  Electrocardiogram:  NSR rate 58 PR 252 nonspecific ST/T wave changes  Assessment and Plan

## 2012-03-28 NOTE — Assessment & Plan Note (Signed)
Stable with no angina and good activity level.  Continue medical Rx  

## 2012-04-01 ENCOUNTER — Other Ambulatory Visit: Payer: Self-pay | Admitting: Cardiovascular Disease

## 2012-04-28 ENCOUNTER — Other Ambulatory Visit: Payer: Self-pay | Admitting: Cardiovascular Disease

## 2012-05-05 DIAGNOSIS — E785 Hyperlipidemia, unspecified: Secondary | ICD-10-CM | POA: Diagnosis not present

## 2012-05-05 DIAGNOSIS — I10 Essential (primary) hypertension: Secondary | ICD-10-CM | POA: Diagnosis not present

## 2012-05-05 DIAGNOSIS — Z23 Encounter for immunization: Secondary | ICD-10-CM | POA: Diagnosis not present

## 2012-05-05 DIAGNOSIS — H4011X0 Primary open-angle glaucoma, stage unspecified: Secondary | ICD-10-CM | POA: Diagnosis not present

## 2012-05-05 DIAGNOSIS — E109 Type 1 diabetes mellitus without complications: Secondary | ICD-10-CM | POA: Diagnosis not present

## 2012-05-05 DIAGNOSIS — H409 Unspecified glaucoma: Secondary | ICD-10-CM | POA: Diagnosis not present

## 2012-05-05 DIAGNOSIS — M1990 Unspecified osteoarthritis, unspecified site: Secondary | ICD-10-CM | POA: Diagnosis not present

## 2012-06-30 ENCOUNTER — Other Ambulatory Visit: Payer: Self-pay | Admitting: *Deleted

## 2012-06-30 MED ORDER — CARVEDILOL 12.5 MG PO TABS: 12.5000 mg | ORAL_TABLET | Freq: Two times a day (BID) | ORAL | Status: DC

## 2012-07-01 ENCOUNTER — Other Ambulatory Visit: Payer: Self-pay | Admitting: *Deleted

## 2012-07-01 MED ORDER — CARVEDILOL 12.5 MG PO TABS: 12.5000 mg | ORAL_TABLET | Freq: Two times a day (BID) | ORAL | Status: DC

## 2012-08-04 DIAGNOSIS — I10 Essential (primary) hypertension: Secondary | ICD-10-CM | POA: Diagnosis not present

## 2012-08-04 DIAGNOSIS — H409 Unspecified glaucoma: Secondary | ICD-10-CM | POA: Diagnosis not present

## 2012-08-04 DIAGNOSIS — H353190 Nonexudative age-related macular degeneration, unspecified eye, stage unspecified: Secondary | ICD-10-CM | POA: Diagnosis not present

## 2012-08-04 DIAGNOSIS — H4011X0 Primary open-angle glaucoma, stage unspecified: Secondary | ICD-10-CM | POA: Diagnosis not present

## 2012-08-04 DIAGNOSIS — M1990 Unspecified osteoarthritis, unspecified site: Secondary | ICD-10-CM | POA: Diagnosis not present

## 2012-08-04 DIAGNOSIS — E109 Type 1 diabetes mellitus without complications: Secondary | ICD-10-CM | POA: Diagnosis not present

## 2012-08-04 DIAGNOSIS — E785 Hyperlipidemia, unspecified: Secondary | ICD-10-CM | POA: Diagnosis not present

## 2012-08-04 DIAGNOSIS — H43399 Other vitreous opacities, unspecified eye: Secondary | ICD-10-CM | POA: Diagnosis not present

## 2012-08-25 ENCOUNTER — Telehealth: Payer: Self-pay | Admitting: Cardiovascular Disease

## 2012-08-25 NOTE — Telephone Encounter (Signed)
New problem    Tooth pull on 3/11. Please advise plavix. Any pre-med

## 2012-08-25 NOTE — Telephone Encounter (Signed)
PT NEEDS TO HOLD PLAVIX PRIOR TO TOOTH EXTRACTION  ALSO NEEDS   ANTIBIOTIC  FOR  PROCEDURE WILL FORWARD TO DR Eden Emms FOR REVIEW .Zack Seal

## 2012-08-25 NOTE — Telephone Encounter (Signed)
Ok to stop Plavix Has tissue AVR can call amoxicillin 4  500mg  tablests ( total 2 grams) 1 hour before procedure.

## 2012-08-25 NOTE — Telephone Encounter (Signed)
PT AWARE  MAY HOLD PLAVIX 5 DAYS  PRIOR TO  PROCEDURE AND PER PT HAS AMOXICILLIN AT HOME WILL TAKE 2 GR  1 HOUR BEFORE TOOTH EXTRACTION ./CY

## 2012-09-03 DIAGNOSIS — Z8546 Personal history of malignant neoplasm of prostate: Secondary | ICD-10-CM | POA: Diagnosis not present

## 2012-09-29 ENCOUNTER — Other Ambulatory Visit: Payer: Self-pay | Admitting: *Deleted

## 2012-09-29 MED ORDER — PANTOPRAZOLE SODIUM 40 MG PO TBEC: DELAYED_RELEASE_TABLET | ORAL | Status: DC

## 2012-10-24 ENCOUNTER — Other Ambulatory Visit: Payer: Self-pay | Admitting: *Deleted

## 2012-10-24 MED ORDER — CLOPIDOGREL BISULFATE 75 MG PO TABS: ORAL_TABLET | ORAL | Status: DC

## 2012-12-01 DIAGNOSIS — E785 Hyperlipidemia, unspecified: Secondary | ICD-10-CM | POA: Diagnosis not present

## 2012-12-01 DIAGNOSIS — I10 Essential (primary) hypertension: Secondary | ICD-10-CM | POA: Diagnosis not present

## 2012-12-01 DIAGNOSIS — I259 Chronic ischemic heart disease, unspecified: Secondary | ICD-10-CM | POA: Diagnosis not present

## 2012-12-01 DIAGNOSIS — H409 Unspecified glaucoma: Secondary | ICD-10-CM | POA: Diagnosis not present

## 2012-12-01 DIAGNOSIS — E109 Type 1 diabetes mellitus without complications: Secondary | ICD-10-CM | POA: Diagnosis not present

## 2012-12-01 DIAGNOSIS — H4011X0 Primary open-angle glaucoma, stage unspecified: Secondary | ICD-10-CM | POA: Diagnosis not present

## 2012-12-19 ENCOUNTER — Other Ambulatory Visit: Payer: Self-pay | Admitting: Cardiovascular Disease

## 2013-01-26 ENCOUNTER — Other Ambulatory Visit: Payer: Self-pay

## 2013-01-26 ENCOUNTER — Other Ambulatory Visit: Payer: Self-pay | Admitting: *Deleted

## 2013-01-26 MED ORDER — AMLODIPINE BESYLATE 10 MG PO TABS: ORAL_TABLET | ORAL | Status: DC

## 2013-02-09 ENCOUNTER — Other Ambulatory Visit (HOSPITAL_COMMUNITY): Payer: Self-pay | Admitting: Pulmonary Disease

## 2013-02-09 ENCOUNTER — Ambulatory Visit (HOSPITAL_COMMUNITY)
Admission: RE | Admit: 2013-02-09 | Discharge: 2013-02-09 | Disposition: A | Discharge status: Home / Self Care | Payer: Medicare Other | Source: Ambulatory Visit | Attending: Pulmonary Disease | Admitting: Pulmonary Disease

## 2013-02-09 DIAGNOSIS — E119 Type 2 diabetes mellitus without complications: Secondary | ICD-10-CM | POA: Diagnosis present

## 2013-02-09 DIAGNOSIS — Z951 Presence of aortocoronary bypass graft: Secondary | ICD-10-CM

## 2013-02-09 DIAGNOSIS — Z954 Presence of other heart-valve replacement: Secondary | ICD-10-CM

## 2013-02-09 DIAGNOSIS — Z8546 Personal history of malignant neoplasm of prostate: Secondary | ICD-10-CM | POA: Diagnosis not present

## 2013-02-09 DIAGNOSIS — R0602 Shortness of breath: Secondary | ICD-10-CM | POA: Diagnosis not present

## 2013-02-09 DIAGNOSIS — R059 Cough, unspecified: Secondary | ICD-10-CM | POA: Insufficient documentation

## 2013-02-09 DIAGNOSIS — I5031 Acute diastolic (congestive) heart failure: Secondary | ICD-10-CM | POA: Diagnosis present

## 2013-02-09 DIAGNOSIS — I509 Heart failure, unspecified: Secondary | ICD-10-CM | POA: Diagnosis not present

## 2013-02-09 DIAGNOSIS — Z79899 Other long term (current) drug therapy: Secondary | ICD-10-CM

## 2013-02-09 DIAGNOSIS — I2510 Atherosclerotic heart disease of native coronary artery without angina pectoris: Secondary | ICD-10-CM | POA: Diagnosis present

## 2013-02-09 DIAGNOSIS — Z96659 Presence of unspecified artificial knee joint: Secondary | ICD-10-CM | POA: Diagnosis not present

## 2013-02-09 DIAGNOSIS — J209 Acute bronchitis, unspecified: Secondary | ICD-10-CM | POA: Diagnosis not present

## 2013-02-09 DIAGNOSIS — K219 Gastro-esophageal reflux disease without esophagitis: Secondary | ICD-10-CM | POA: Diagnosis present

## 2013-02-09 DIAGNOSIS — J449 Chronic obstructive pulmonary disease, unspecified: Secondary | ICD-10-CM | POA: Diagnosis not present

## 2013-02-09 DIAGNOSIS — R05 Cough: Secondary | ICD-10-CM

## 2013-02-09 DIAGNOSIS — Z7902 Long term (current) use of antithrombotics/antiplatelets: Secondary | ICD-10-CM

## 2013-02-09 DIAGNOSIS — E871 Hypo-osmolality and hyponatremia: Secondary | ICD-10-CM | POA: Diagnosis not present

## 2013-02-09 DIAGNOSIS — E109 Type 1 diabetes mellitus without complications: Secondary | ICD-10-CM | POA: Diagnosis not present

## 2013-02-09 DIAGNOSIS — Z952 Presence of prosthetic heart valve: Secondary | ICD-10-CM

## 2013-02-09 DIAGNOSIS — Z87891 Personal history of nicotine dependence: Secondary | ICD-10-CM | POA: Diagnosis not present

## 2013-02-09 DIAGNOSIS — I10 Essential (primary) hypertension: Secondary | ICD-10-CM | POA: Diagnosis present

## 2013-02-09 DIAGNOSIS — J189 Pneumonia, unspecified organism: Secondary | ICD-10-CM | POA: Diagnosis not present

## 2013-02-09 DIAGNOSIS — R062 Wheezing: Secondary | ICD-10-CM | POA: Diagnosis not present

## 2013-02-09 DIAGNOSIS — R609 Edema, unspecified: Secondary | ICD-10-CM | POA: Diagnosis not present

## 2013-02-09 DIAGNOSIS — I259 Chronic ischemic heart disease, unspecified: Secondary | ICD-10-CM | POA: Diagnosis not present

## 2013-02-09 DIAGNOSIS — I517 Cardiomegaly: Secondary | ICD-10-CM | POA: Diagnosis not present

## 2013-02-10 ENCOUNTER — Inpatient Hospital Stay (HOSPITAL_COMMUNITY)
Admission: AD | Admit: 2013-02-10 | Discharge: 2013-02-16 | DRG: 193 | Disposition: A | Discharge status: Home / Self Care | Payer: Medicare Other | Source: Ambulatory Visit | Attending: Pulmonary Disease | Admitting: Pulmonary Disease

## 2013-02-10 DIAGNOSIS — I10 Essential (primary) hypertension: Secondary | ICD-10-CM | POA: Diagnosis present

## 2013-02-10 DIAGNOSIS — J449 Chronic obstructive pulmonary disease, unspecified: Secondary | ICD-10-CM

## 2013-02-10 DIAGNOSIS — Z952 Presence of prosthetic heart valve: Secondary | ICD-10-CM

## 2013-02-10 DIAGNOSIS — I517 Cardiomegaly: Secondary | ICD-10-CM | POA: Diagnosis not present

## 2013-02-10 DIAGNOSIS — E119 Type 2 diabetes mellitus without complications: Secondary | ICD-10-CM | POA: Diagnosis present

## 2013-02-10 DIAGNOSIS — E871 Hypo-osmolality and hyponatremia: Secondary | ICD-10-CM | POA: Diagnosis not present

## 2013-02-10 DIAGNOSIS — J189 Pneumonia, unspecified organism: Secondary | ICD-10-CM | POA: Diagnosis present

## 2013-02-10 DIAGNOSIS — R0602 Shortness of breath: Secondary | ICD-10-CM

## 2013-02-10 DIAGNOSIS — Z951 Presence of aortocoronary bypass graft: Secondary | ICD-10-CM

## 2013-02-10 DIAGNOSIS — Z954 Presence of other heart-valve replacement: Secondary | ICD-10-CM

## 2013-02-10 DIAGNOSIS — R600 Localized edema: Secondary | ICD-10-CM

## 2013-02-10 DIAGNOSIS — I509 Heart failure, unspecified: Secondary | ICD-10-CM | POA: Diagnosis not present

## 2013-02-10 LAB — CBC WITH DIFFERENTIAL/PLATELET
Basophils Absolute: 0 10*3/uL (ref 0.0–0.1)
Eosinophils Absolute: 0 10*3/uL (ref 0.0–0.7)
Eosinophils Relative: 0 % (ref 0–5)
Lymphocytes Relative: 10 % — ABNORMAL LOW (ref 12–46)
MCV: 86.6 fL (ref 78.0–100.0)
Platelets: 228 10*3/uL (ref 150–400)
RDW: 12.2 % (ref 11.5–15.5)
WBC: 14.6 10*3/uL — ABNORMAL HIGH (ref 4.0–10.5)

## 2013-02-10 LAB — GLUCOSE, CAPILLARY: Glucose-Capillary: 149 mg/dL — ABNORMAL HIGH (ref 70–99)

## 2013-02-10 LAB — COMPREHENSIVE METABOLIC PANEL
ALT: 18 U/L (ref 0–53)
AST: 20 U/L (ref 0–37)
Albumin: 3.5 g/dL (ref 3.5–5.2)
CO2: 29 mEq/L (ref 19–32)
Calcium: 8.8 mg/dL (ref 8.4–10.5)
Sodium: 116 mEq/L — CL (ref 135–145)
Total Protein: 7.1 g/dL (ref 6.0–8.3)

## 2013-02-10 MED ORDER — CARVEDILOL 12.5 MG PO TABS
12.5000 mg | ORAL_TABLET | Freq: Two times a day (BID) | ORAL | Status: DC
  Administered 2013-02-10 – 2013-02-16 (×12): 12.5 mg via ORAL
  Filled 2013-02-10 (×13): qty 1

## 2013-02-10 MED ORDER — BENZONATATE 100 MG PO CAPS
200.0000 mg | ORAL_CAPSULE | Freq: Three times a day (TID) | ORAL | Status: DC | PRN
  Administered 2013-02-10 – 2013-02-15 (×12): 200 mg via ORAL
  Filled 2013-02-10 (×12): qty 2

## 2013-02-10 MED ORDER — SODIUM CHLORIDE 0.9 % IV SOLN: 250.0000 mL | INTRAVENOUS | Status: DC | PRN

## 2013-02-10 MED ORDER — SODIUM CHLORIDE 0.9 % IV SOLN
INTRAVENOUS | Status: DC
  Administered 2013-02-10 – 2013-02-14 (×6): via INTRAVENOUS

## 2013-02-10 MED ORDER — ONDANSETRON HCL 4 MG PO TABS: 4.0000 mg | ORAL_TABLET | Freq: Four times a day (QID) | ORAL | Status: DC | PRN

## 2013-02-10 MED ORDER — HYDROCODONE-ACETAMINOPHEN 5-325 MG PO TABS
1.0000 | ORAL_TABLET | ORAL | Status: DC | PRN
  Administered 2013-02-11: 1 via ORAL
  Filled 2013-02-10: qty 1

## 2013-02-10 MED ORDER — ONDANSETRON HCL 4 MG/2ML IJ SOLN: 4.0000 mg | Freq: Four times a day (QID) | INTRAMUSCULAR | Status: DC | PRN

## 2013-02-10 MED ORDER — ENOXAPARIN SODIUM 40 MG/0.4ML ~~LOC~~ SOLN
40.0000 mg | SUBCUTANEOUS | Status: DC
  Administered 2013-02-10 – 2013-02-15 (×6): 40 mg via SUBCUTANEOUS
  Filled 2013-02-10 (×6): qty 0.4

## 2013-02-10 MED ORDER — AMLODIPINE BESYLATE 5 MG PO TABS
10.0000 mg | ORAL_TABLET | Freq: Every day | ORAL | Status: DC
  Administered 2013-02-11 – 2013-02-15 (×5): 10 mg via ORAL
  Filled 2013-02-10 (×6): qty 2

## 2013-02-10 MED ORDER — CLOPIDOGREL BISULFATE 75 MG PO TABS
75.0000 mg | ORAL_TABLET | Freq: Every day | ORAL | Status: DC
  Administered 2013-02-11 – 2013-02-16 (×6): 75 mg via ORAL
  Filled 2013-02-10 (×6): qty 1

## 2013-02-10 MED ORDER — POTASSIUM CHLORIDE CRYS ER 20 MEQ PO TBCR
20.0000 meq | EXTENDED_RELEASE_TABLET | Freq: Every day | ORAL | Status: DC
  Administered 2013-02-11 – 2013-02-15 (×5): 20 meq via ORAL
  Filled 2013-02-10 (×5): qty 1
  Filled 2013-02-10: qty 2
  Filled 2013-02-10: qty 1

## 2013-02-10 MED ORDER — TRAZODONE HCL 50 MG PO TABS
50.0000 mg | ORAL_TABLET | Freq: Every evening | ORAL | Status: DC | PRN
  Administered 2013-02-10 – 2013-02-15 (×4): 50 mg via ORAL
  Filled 2013-02-10 (×4): qty 1

## 2013-02-10 MED ORDER — SODIUM CHLORIDE 0.9 % IJ SOLN
3.0000 mL | Freq: Two times a day (BID) | INTRAMUSCULAR | Status: DC
  Administered 2013-02-10 – 2013-02-14 (×5): 3 mL via INTRAVENOUS

## 2013-02-10 MED ORDER — ATORVASTATIN CALCIUM 10 MG PO TABS
10.0000 mg | ORAL_TABLET | Freq: Every day | ORAL | Status: DC
  Administered 2013-02-11 – 2013-02-15 (×5): 10 mg via ORAL
  Filled 2013-02-10 (×6): qty 1

## 2013-02-10 MED ORDER — LATANOPROST 0.005 % OP SOLN
1.0000 [drp] | Freq: Every day | OPHTHALMIC | Status: DC
  Administered 2013-02-10 – 2013-02-15 (×6): 1 [drp] via OPHTHALMIC
  Filled 2013-02-10: qty 2.5

## 2013-02-10 MED ORDER — PANTOPRAZOLE SODIUM 40 MG PO TBEC
40.0000 mg | DELAYED_RELEASE_TABLET | Freq: Every day | ORAL | Status: DC
  Administered 2013-02-11 – 2013-02-15 (×5): 40 mg via ORAL
  Filled 2013-02-10 (×5): qty 1

## 2013-02-10 MED ORDER — DEXTROSE 5 % IV SOLN
1.0000 g | INTRAVENOUS | Status: DC
  Administered 2013-02-10 – 2013-02-15 (×6): 1 g via INTRAVENOUS
  Filled 2013-02-10 (×7): qty 10

## 2013-02-10 MED ORDER — FUROSEMIDE 10 MG/ML IJ SOLN
40.0000 mg | Freq: Two times a day (BID) | INTRAMUSCULAR | Status: DC
  Administered 2013-02-10 – 2013-02-12 (×5): 40 mg via INTRAVENOUS
  Filled 2013-02-10 (×5): qty 4

## 2013-02-10 MED ORDER — DEXTROSE 5 % IV SOLN
500.0000 mg | INTRAVENOUS | Status: DC
  Administered 2013-02-10: 500 mg via INTRAVENOUS
  Filled 2013-02-10 (×3): qty 500

## 2013-02-10 MED ORDER — ACETAMINOPHEN 325 MG PO TABS: 650.0000 mg | ORAL_TABLET | Freq: Four times a day (QID) | ORAL | Status: DC | PRN

## 2013-02-10 MED ORDER — ACETAMINOPHEN 650 MG RE SUPP: 650.0000 mg | Freq: Four times a day (QID) | RECTAL | Status: DC | PRN

## 2013-02-10 MED ORDER — IRBESARTAN 75 MG PO TABS
75.0000 mg | ORAL_TABLET | Freq: Every day | ORAL | Status: DC
  Filled 2013-02-10: qty 1

## 2013-02-10 MED ORDER — SODIUM CHLORIDE 0.9 % IJ SOLN: 3.0000 mL | INTRAMUSCULAR | Status: DC | PRN

## 2013-02-10 MED ORDER — ALBUTEROL SULFATE (5 MG/ML) 0.5% IN NEBU
2.5000 mg | INHALATION_SOLUTION | RESPIRATORY_TRACT | Status: DC | PRN
  Administered 2013-02-10 – 2013-02-13 (×4): 2.5 mg via RESPIRATORY_TRACT
  Filled 2013-02-10 (×4): qty 0.5

## 2013-02-10 MED ORDER — GUAIFENESIN ER 600 MG PO TB12
1200.0000 mg | ORAL_TABLET | Freq: Two times a day (BID) | ORAL | Status: DC
  Administered 2013-02-10 – 2013-02-15 (×12): 1200 mg via ORAL
  Filled 2013-02-10 (×12): qty 2

## 2013-02-10 MED ORDER — ALUM & MAG HYDROXIDE-SIMETH 200-200-20 MG/5ML PO SUSP: 30.0000 mL | Freq: Four times a day (QID) | ORAL | Status: DC | PRN

## 2013-02-10 MED ORDER — SODIUM CHLORIDE 0.9 % IJ SOLN
3.0000 mL | Freq: Two times a day (BID) | INTRAMUSCULAR | Status: DC
  Administered 2013-02-10 – 2013-02-15 (×4): 3 mL via INTRAVENOUS
  Filled 2013-02-10: qty 3

## 2013-02-10 NOTE — Progress Notes (Signed)
CRITICAL VALUE ALERT  Critical value received:  Sodium 116 Date of notification: 02/10/13 Time of notification: 1420 Critical value read back:yes  Nurse who received alert:  C. Raul Del, RN MD notified (1st page): 1425 Time of first page: 1425 MD notified (2nd page):  Time of second page:  Responding MD:  Dr. Juanetta Gosling  Time MD responded: 1430

## 2013-02-10 NOTE — Progress Notes (Signed)
*  PRELIMINARY RESULTS* Echocardiogram 2D Echocardiogram has been performed.  Terry Wu 02/10/2013, 3:24 PM

## 2013-02-11 ENCOUNTER — Encounter (HOSPITAL_COMMUNITY): Payer: Self-pay | Admitting: *Deleted

## 2013-02-11 ENCOUNTER — Inpatient Hospital Stay (HOSPITAL_COMMUNITY): Payer: Medicare Other

## 2013-02-11 DIAGNOSIS — I509 Heart failure, unspecified: Secondary | ICD-10-CM | POA: Diagnosis not present

## 2013-02-11 DIAGNOSIS — J189 Pneumonia, unspecified organism: Secondary | ICD-10-CM | POA: Diagnosis not present

## 2013-02-11 DIAGNOSIS — E871 Hypo-osmolality and hyponatremia: Secondary | ICD-10-CM | POA: Diagnosis not present

## 2013-02-11 LAB — BASIC METABOLIC PANEL
BUN: 13 mg/dL (ref 6–23)
Chloride: 81 mEq/L — ABNORMAL LOW (ref 96–112)
Glucose, Bld: 119 mg/dL — ABNORMAL HIGH (ref 70–99)
Potassium: 3.7 mEq/L (ref 3.5–5.1)

## 2013-02-11 LAB — GLUCOSE, CAPILLARY: Glucose-Capillary: 166 mg/dL — ABNORMAL HIGH (ref 70–99)

## 2013-02-11 MED ORDER — INSULIN ASPART 100 UNIT/ML ~~LOC~~ SOLN
0.0000 [IU] | Freq: Three times a day (TID) | SUBCUTANEOUS | Status: DC
  Administered 2013-02-12: 3 [IU] via SUBCUTANEOUS
  Administered 2013-02-12: 2 [IU] via SUBCUTANEOUS
  Administered 2013-02-12 – 2013-02-13 (×2): 3 [IU] via SUBCUTANEOUS
  Administered 2013-02-13 (×2): 5 [IU] via SUBCUTANEOUS
  Administered 2013-02-14: 3 [IU] via SUBCUTANEOUS
  Administered 2013-02-14 – 2013-02-15 (×3): 5 [IU] via SUBCUTANEOUS
  Administered 2013-02-15: 2 [IU] via SUBCUTANEOUS
  Administered 2013-02-15: 3 [IU] via SUBCUTANEOUS
  Administered 2013-02-16: 2 [IU] via SUBCUTANEOUS

## 2013-02-11 MED ORDER — ALPRAZOLAM 0.25 MG PO TABS
0.2500 mg | ORAL_TABLET | Freq: Three times a day (TID) | ORAL | Status: DC | PRN
  Administered 2013-02-11 – 2013-02-12 (×2): 0.25 mg via ORAL
  Filled 2013-02-11 (×2): qty 1

## 2013-02-11 MED ORDER — AZITHROMYCIN 250 MG PO TABS
500.0000 mg | ORAL_TABLET | Freq: Every day | ORAL | Status: DC
  Administered 2013-02-11 – 2013-02-15 (×5): 500 mg via ORAL
  Filled 2013-02-11 (×5): qty 2

## 2013-02-11 MED ORDER — IRBESARTAN 300 MG PO TABS
300.0000 mg | ORAL_TABLET | Freq: Every day | ORAL | Status: DC
  Administered 2013-02-11 – 2013-02-15 (×5): 300 mg via ORAL
  Filled 2013-02-11 (×5): qty 1

## 2013-02-11 MED ORDER — ALBUTEROL SULFATE (5 MG/ML) 0.5% IN NEBU
2.5000 mg | INHALATION_SOLUTION | Freq: Two times a day (BID) | RESPIRATORY_TRACT | Status: DC
  Administered 2013-02-11 – 2013-02-16 (×10): 2.5 mg via RESPIRATORY_TRACT
  Filled 2013-02-11 (×9): qty 0.5

## 2013-02-11 NOTE — Progress Notes (Signed)
PHARMACIST - PHYSICIAN COMMUNICATION DR:   [facility] CONCERNING: Antibiotic IV to Oral Route Change Policy  RECOMMENDATION: This patient is receiving Zithromax by the intravenous route.  Based on criteria approved by the Pharmacy and Therapeutics Committee, the antibiotic(s) is/are being converted to the equivalent oral dose form(s).   DESCRIPTION: These criteria include:  Patient being treated for a respiratory tract infection, urinary tract infection, cellulitis or clostridium difficile associated diarrhea if on metronidazole  The patient is not neutropenic and does not exhibit a GI malabsorption state  The patient is eating (either orally or via tube) and/or has been taking other orally administered medications for a least 24 hours  The patient is improving clinically and has a Tmax < 100.5  If you have questions about this conversion, please contact the Pharmacy Department  [x]  ( [phone] )  [facility] []  ( [phone] )  [facility]  []  ( [phone] )  [facility] []  ( [phone] )  [facility]   S. Elishia Kaczorowski, PharmD  

## 2013-02-11 NOTE — Progress Notes (Signed)
Subjective: He feels somewhat better. He is still coughing and congested but not as badly. He was able to sleep a little bit last night.  Objective: Vital signs in last 24 hours: Temp:  [97.7 F (36.5 C)-98.6 F (37 C)] 98.6 F (37 C) (08/20 0500) Pulse Rate:  [59-68] 59 (08/20 0500) Resp:  [18-24] 22 (08/20 0500) BP: (157-169)/(77-80) 169/80 mmHg (08/20 0500) SpO2:  [93 %-96 %] 93 % (08/20 0500) Weight:  [109.634 kg (241 lb 11.2 oz)-110.542 kg (243 lb 11.2 oz)] 109.634 kg (241 lb 11.2 oz) (08/20 0500) Weight change:  Last BM Date: 02/10/13  Intake/Output from previous day: 08/19 0701 - 08/20 0700 In: 660 [P.O.:360; IV Piggyback:300] Out: 1200 [Urine:1200]  PHYSICAL EXAM General appearance: alert, cooperative and mild distress Resp: rhonchi bilaterally Cardio: regular rate and rhythm, S1, S2 normal, no murmur, click, rub or gallop GI: soft, non-tender; bowel sounds normal; no masses,  no organomegaly Extremities: He still has edema but it is less than yesterday  Lab Results:    Basic Metabolic Panel:  Recent Labs  16/10/96 1302 02/11/13 0509  NA 116* 120*  K 4.1 3.7  CL 79* 81*  CO2 29 32  GLUCOSE 143* 119*  BUN 14 13  CREATININE 0.64 0.74  CALCIUM 8.8 8.5   Liver Function Tests:  Recent Labs  02/10/13 1302  AST 20  ALT 18  ALKPHOS 37*  BILITOT 0.4  PROT 7.1  ALBUMIN 3.5   No results found for this basename: LIPASE, AMYLASE,  in the last 72 hours No results found for this basename: AMMONIA,  in the last 72 hours CBC:  Recent Labs  02/10/13 1302  WBC 14.6*  NEUTROABS 12.0*  HGB 13.5  HCT 36.7*  MCV 86.6  PLT 228   Cardiac Enzymes: No results found for this basename: CKTOTAL, CKMB, CKMBINDEX, TROPONINI,  in the last 72 hours BNP:  Recent Labs  02/10/13 1302  PROBNP 486.0*   D-Dimer: No results found for this basename: DDIMER,  in the last 72 hours CBG:  Recent Labs  02/10/13 1218  GLUCAP 149*   Hemoglobin A1C: No results found  for this basename: HGBA1C,  in the last 72 hours Fasting Lipid Panel: No results found for this basename: CHOL, HDL, LDLCALC, TRIG, CHOLHDL, LDLDIRECT,  in the last 72 hours Thyroid Function Tests: No results found for this basename: TSH, T4TOTAL, FREET4, T3FREE, THYROIDAB,  in the last 72 hours Anemia Panel: No results found for this basename: VITAMINB12, FOLATE, FERRITIN, TIBC, IRON, RETICCTPCT,  in the last 72 hours Coagulation: No results found for this basename: LABPROT, INR,  in the last 72 hours Urine Drug Screen: Drugs of Abuse  No results found for this basename: labopia, cocainscrnur, labbenz, amphetmu, thcu, labbarb    Alcohol Level: No results found for this basename: ETH,  in the last 72 hours Urinalysis: No results found for this basename: COLORURINE, APPERANCEUR, LABSPEC, PHURINE, GLUCOSEU, HGBUR, BILIRUBINUR, KETONESUR, PROTEINUR, UROBILINOGEN, NITRITE, LEUKOCYTESUR,  in the last 72 hours Misc. Labs:  ABGS No results found for this basename: PHART, PCO2, PO2ART, TCO2, HCO3,  in the last 72 hours CULTURES No results found for this or any previous visit (from the past 240 hour(s)). Studies/Results: Dg Chest 2 View  02/09/2013   *RADIOLOGY REPORT*  Clinical Data: Cough and wheezing  CHEST - 2 VIEW  Comparison: 09/19/2007  Findings: Previous median sternotomy and CABG procedure.  There is no pleural effusion identified.  Asymmetric elevation of the left hemidiaphragm is again  noted.  Opacity within the left midlung and left base is noted which may represent atelectasis or infiltrate.  IMPRESSION:  1.  Left midlung and left base opacities which may represent atelectasis and/or infiltrates.   Original Report Authenticated By: Signa Kell, M.D.   Dg Chest Port 1 View  02/11/2013   *RADIOLOGY REPORT*  Clinical Data: Pneumonia  PORTABLE CHEST - 1 VIEW  Comparison: Portable exam 0620 hours compared to 02/09/2013  Findings: Minimal enlargement of cardiac silhouette post CABG.  Calcified thoracic aorta. Mild persistent atelectasis or infiltrate at left base. Remaining lungs clear. No gross pleural effusion or pneumothorax.  IMPRESSION: Mild persistent atelectasis versus infiltrate at left base.   Original Report Authenticated By: Ulyses Southward, M.D.    Medications:  Prior to Admission:  Prescriptions prior to admission  Medication Sig Dispense Refill  . acetaminophen (TYLENOL) 500 MG tablet Take 500 mg by mouth every 6 (six) hours as needed.      [name] amLODipine (NORVASC) 10 MG tablet Take 10 mg by mouth daily.      [name] amoxicillin (AMOXIL) 500 MG capsule Take 2,000 mg by mouth as needed (prior to dental appt.).      [name] benzonatate (TESSALON) 100 MG capsule Take 200 mg by mouth 3 (three) times daily as needed for cough.      . carvedilol (COREG) 12.5 MG tablet Take 1 tablet (12.5 mg total) by mouth 2 (two) times daily with a meal.  60 tablet  12  . clopidogrel (PLAVIX) 75 MG tablet Take 75 mg by mouth daily.      . Coenzyme Q10 (CO Q 10) 100 MG CAPS Take 1 capsule by mouth every morning.      [name] glimepiride (AMARYL) 2 MG tablet Take 1 mg by mouth daily before breakfast.      . latanoprost (XALATAN) 0.005 % ophthalmic solution Place 1 drop into both eyes at bedtime.      . Multiple Vitamins-Minerals (ICAPS PO) Take 2 tablets by mouth 3 (three) times daily after meals.       . naproxen sodium (ALEVE) 220 MG tablet Take 220 mg by mouth 2 (two) times daily with a meal.      . pantoprazole (PROTONIX) 40 MG tablet Take 40 mg by mouth every evening.      . potassium chloride SA (K-DUR,KLOR-CON) 20 MEQ tablet Take 20 mEq by mouth 2 (two) times daily.      [name] telmisartan (MICARDIS) 20 MG tablet Take 20 mg by mouth daily at 12 noon.      . triamterene-hydrochlorothiazide (MAXZIDE-25) 37.5-25 MG per tablet Take 1 tablet by mouth daily.       Scheduled: . amLODipine  10 mg Oral Daily  . atorvastatin  10 mg Oral q1800  . azithromycin  500 mg Intravenous Q24H  . carvedilol  12.5 mg Oral  BID WC  . cefTRIAXone (ROCEPHIN)  IV  1 g Intravenous Q24H  . clopidogrel  75 mg Oral Q breakfast  . enoxaparin (LOVENOX) injection  40 mg Subcutaneous Q24H  . furosemide  40 mg Intravenous Q12H  . guaiFENesin  1,200 mg Oral BID  . irbesartan  300 mg Oral Daily  . latanoprost  1 drop Both Eyes QHS  . pantoprazole  40 mg Oral Daily  . potassium chloride SA  20 mEq Oral Daily  . sodium chloride  3 mL Intravenous Q12H  . sodium chloride  3 mL Intravenous Q12H   Continuous: . sodium chloride 75 mL/hr at  02/11/13 0510   ZOX:WRUEAV chloride, acetaminophen, acetaminophen, albuterol, alum & mag hydroxide-simeth, benzonatate, HYDROcodone-acetaminophen, ondansetron (ZOFRAN) IV, ondansetron, sodium chloride, traZODone  Assesment: He was admitted with pneumonia. He was also found to be markedly hyponatremic. He may have some element of diastolic congestive heart failure as well. His blood pressure has been high. His blood sugar has been high. Active Problems:   * No active hospital problems. *    Plan: He has improved but is not ready for discharge. Continue with current IV antibiotics inhaled bronchodilators. I will increase blood pressure medications. His echocardiogram showed a suggestion of diastolic issues but his valve looks okay    LOS: 1 day   Karn Derk L 02/11/2013, 8:39 AM

## 2013-02-11 NOTE — Progress Notes (Signed)
UR chart review completed.  

## 2013-02-12 DIAGNOSIS — Z954 Presence of other heart-valve replacement: Secondary | ICD-10-CM

## 2013-02-12 DIAGNOSIS — I10 Essential (primary) hypertension: Secondary | ICD-10-CM | POA: Diagnosis not present

## 2013-02-12 DIAGNOSIS — J189 Pneumonia, unspecified organism: Principal | ICD-10-CM

## 2013-02-12 DIAGNOSIS — R609 Edema, unspecified: Secondary | ICD-10-CM

## 2013-02-12 DIAGNOSIS — R600 Localized edema: Secondary | ICD-10-CM

## 2013-02-12 DIAGNOSIS — E871 Hypo-osmolality and hyponatremia: Secondary | ICD-10-CM | POA: Diagnosis present

## 2013-02-12 DIAGNOSIS — R0602 Shortness of breath: Secondary | ICD-10-CM

## 2013-02-12 DIAGNOSIS — I509 Heart failure, unspecified: Secondary | ICD-10-CM | POA: Diagnosis not present

## 2013-02-12 DIAGNOSIS — J449 Chronic obstructive pulmonary disease, unspecified: Secondary | ICD-10-CM

## 2013-02-12 DIAGNOSIS — Z951 Presence of aortocoronary bypass graft: Secondary | ICD-10-CM

## 2013-02-12 LAB — BASIC METABOLIC PANEL
BUN: 11 mg/dL (ref 6–23)
CO2: 33 mEq/L — ABNORMAL HIGH (ref 19–32)
Chloride: 84 mEq/L — ABNORMAL LOW (ref 96–112)
Creatinine, Ser: 0.63 mg/dL (ref 0.50–1.35)

## 2013-02-12 LAB — GLUCOSE, CAPILLARY: Glucose-Capillary: 160 mg/dL — ABNORMAL HIGH (ref 70–99)

## 2013-02-12 MED ORDER — TRIAMTERENE-HCTZ 37.5-25 MG PO TABS
1.0000 | ORAL_TABLET | Freq: Every day | ORAL | Status: DC
  Administered 2013-02-12 – 2013-02-15 (×4): 1 via ORAL
  Filled 2013-02-12 (×4): qty 1

## 2013-02-12 MED ORDER — METHYLPREDNISOLONE SODIUM SUCC 125 MG IJ SOLR
125.0000 mg | Freq: Four times a day (QID) | INTRAMUSCULAR | Status: DC
  Administered 2013-02-12 – 2013-02-13 (×3): 125 mg via INTRAVENOUS
  Filled 2013-02-12 (×3): qty 2

## 2013-02-12 NOTE — Consult Note (Signed)
CARDIOLOGY CONSULT NOTE  Patient ID: Terry Wu MRN: [id number] DOB/AGE: Jan 28, 1932 79 y.o.  Admit date: 02/10/2013 Primary Physician: Juanetta Gosling Reason for Consultation: heart failure  HPI: Terry Wu is a 79 y.o. man who has a history of coronary bypass surgery with tissue aortic valve replacement in 2006. Terry Wu developed a cough a couple of weeks ago after mowing the lawn and weeds, after we'd had a lot of rain. Terry Wu denies any chest tightness or pressure. Terry Wu saw Dr. Juanetta Gosling in the office for this, as his cough and congestion were not improving. Terry Wu was started on antibiotics and steroids. Terry Wu was given Lasix for some leg swelling. Terry Wu was noted to be short of breath, and was thus admitted to the hospital for intravenous therapy.  Terry Wu denies any fevers or chills. Terry Wu had been sleeping in a chair for a few days because Terry Wu could not get comfortable. Upon hospitalization, it was determined Terry Wu had developed a pneumonia and is now on IV antibiotics as well as IV Lasix.  An echocardiogram was recently completed which revealed normal LV systolic function, grade I diastolic dysfunction, and a normally functioning bioprosthetic aortic valve. Prior to his CABG, his symptoms were that of chest tightness, which Terry Wu has not experienced at any time in the recent past.  Terry Wu says his breathing hasn't improved drastically in the last few days.  Terry Wu avoids salt altogether, and monitors both his BP and blood sugars meticulously, and apparently both have been under very good control.   Review of systems complete and found to be negative unless listed above in HPI  Past Medical History: see HPI, diabetes mellitus, GERD, prostate CA surgery  SocHx: ex-smoker, occasional alcohol, married.  FamHx: non-contributory to this admission   Family History  Problem Relation Age of Onset  . Cancer      History   Social History  . Marital Status: Married    Spouse Name: N/A    Number of Children: N/A  . Years of  Education: N/A   Occupational History  . Not on file.   Social History Main Topics  . Smoking status: Never Smoker   . Smokeless tobacco: Not on file  . Alcohol Use: Yes  . Drug Use: No  . Sexual Activity: Not on file   Other Topics Concern  . Not on file   Social History Narrative  . No narrative on file     Prescriptions prior to admission  Medication Sig Dispense Refill  . acetaminophen (TYLENOL) 500 MG tablet Take 500 mg by mouth every 6 (six) hours as needed.      [name] amLODipine (NORVASC) 10 MG tablet Take 10 mg by mouth daily.      [name] amoxicillin (AMOXIL) 500 MG capsule Take 2,000 mg by mouth as needed (prior to dental appt.).      [name] benzonatate (TESSALON) 100 MG capsule Take 200 mg by mouth 3 (three) times daily as needed for cough.      . carvedilol (COREG) 12.5 MG tablet Take 1 tablet (12.5 mg total) by mouth 2 (two) times daily with a meal.  60 tablet  12  . clopidogrel (PLAVIX) 75 MG tablet Take 75 mg by mouth daily.      . Coenzyme Q10 (CO Q 10) 100 MG CAPS Take 1 capsule by mouth every morning.      [name] glimepiride (AMARYL) 2 MG tablet Take 1 mg by mouth daily before breakfast.      . latanoprost (XALATAN)  0.005 % ophthalmic solution Place 1 drop into both eyes at bedtime.      . Multiple Vitamins-Minerals (ICAPS PO) Take 2 tablets by mouth 3 (three) times daily after meals.       . naproxen sodium (ALEVE) 220 MG tablet Take 220 mg by mouth 2 (two) times daily with a meal.      . pantoprazole (PROTONIX) 40 MG tablet Take 40 mg by mouth every evening.      . potassium chloride SA (K-DUR,KLOR-CON) 20 MEQ tablet Take 20 mEq by mouth 2 (two) times daily.      [name] telmisartan (MICARDIS) 20 MG tablet Take 20 mg by mouth daily at 12 noon.      . triamterene-hydrochlorothiazide (MAXZIDE-25) 37.5-25 MG per tablet Take 1 tablet by mouth daily.        Physical exam Blood pressure 142/67, pulse 60, temperature 97.7 F (36.5 C), temperature source Oral, resp. rate 20, height 5\' 9"   (1.753 m), weight 242 lb 4.8 oz (109.907 kg), SpO2 93.00%. General: NAD Neck: No JVD, no thyromegaly or thyroid nodule.  Lungs: Prolonged expiratory phase with expiratory wheezes, no rales, +rhonchi at left base. CV: Nondisplaced PMI.  Heart regular S1/ prosthetic S2, no S3/S4, soft II/VI ejection systolic murmur.  Trace peripheral edema.  No carotid bruit.  Normal pedal pulses.  Abdomen: Soft, obese, nontender, no hepatosplenomegaly, no distention.  Skin: Intact without lesions or rashes. Stasis dermatitis in the legs bilaterally. Neurologic: Alert and oriented x 3.  Psych: Normal affect. Extremities: No clubbing or cyanosis.  HEENT: Normal.   Labs:   Lab Results  Component Value Date   WBC 14.6* 02/10/2013   HGB 13.5 02/10/2013   HCT 36.7* 02/10/2013   MCV 86.6 02/10/2013   PLT 228 02/10/2013    Recent Labs Lab 02/10/13 1302  02/12/13 0504  NA 116*  < > 122*  K 4.1  < > 3.7  CL 79*  < > 84*  CO2 29  < > 33*  BUN 14  < > 11  CREATININE 0.64  < > 0.63  CALCIUM 8.8  < > 7.9*  PROT 7.1  --   --   BILITOT 0.4  --   --   ALKPHOS 37*  --   --   ALT 18  --   --   AST 20  --   --   GLUCOSE 143*  < > 130*  < > = values in this interval not displayed. Lab Results  Component Value Date   CKTOTAL 87 08/30/2006    No results found for this basename: CHOL   No results found for this basename: HDL   No results found for this basename: LDLCALC   No results found for this basename: TRIG   No results found for this basename: CHOLHDL   No results found for this basename: LDLDIRECT    Telemetry: sinus rhythm  Echo: - Left ventricle: The cavity size was normal. There was moderate concentric hypertrophy. Systolic function was normal. The estimated ejection fraction was in the range of 60% to 65%. Although no diagnostic regional wall motion abnormality was identified, this possibility cannot be completely excluded on the basis of this study. Doppler parameters are consistent with  abnormal left ventricular relaxation (grade 1 diastolic dysfunction). - Aortic valve: A bioprosthesis was present. No significant regurgitation. Mean gradient: 10mm Hg (S). VTI ratio of LVOT to aortic valve: 0.52. No paravalvuvlar leak. - Aortic root: The aortic root was upper normal in  size. - Mitral valve: Calcified annulus. Trivial regurgitation. - Left atrium: The atrium was mildly dilated. - Pulmonary arteries: Systolic pressure could not be accurately estimated. - Inferior vena cava: Not visualized. Unable to estimate CVP. - Pericardium, extracardiac: There was no pericardial effusion.  Chest xray: Findings: Previous median sternotomy and CABG procedure. There is  no pleural effusion identified. Asymmetric elevation of the left  hemidiaphragm is again noted. Opacity within the left midlung and  left base is noted which may represent atelectasis or infiltrate.  IMPRESSION:  1. Left midlung and left base opacities which may represent  atelectasis and/or infiltrates.   ASSESSMENT AND PLAN:  1. Shortness of breath: I think this is related to a community-acquired pneumonia with a component of COPD exacerbation as well. Terry Wu has no pulmonary edema and had Terry Wu had a diastolic decompensation, I would have expected this. Terry Wu is being treated with albuterol nebulized treatments and Azithromycin with Ceftriaxone. I will stop his Lasix. I would consider IV Methylprednisolone as well. 2. CAD s/p CABG and bioprosthetic AVR: this is stable, with normal LV systolic function and no paravalvular leak. 3. HTN: controlled at home but slightly uncontrolled in the hospital. Will put him back on his home dose of Triamterene-HCTZ. 4. Leg edema: this appears to have resolved with some Lasix, although I do not recommend any more IV diuresis, and will simply restart his Triamterene-HCTZ.  Signed: Prentice Docker, M.D., F.A.C.C. 02/12/2013, 5:33 PM

## 2013-02-12 NOTE — H&P (Signed)
NAME:  Terry Wu, FLOREA                ACCOUNT NO.:  [id number]  MEDICAL RECORD NO.:  [id number]  LOCATION:  A219                          FACILITY:  [facility]  PHYSICIAN:  Case Vassell Terry Wu, M.D.DATE OF BIRTH:  July 29, 1931  DATE OF ADMISSION:  02/10/2013 DATE OF DISCHARGE:  LH                             HISTORY & PHYSICAL   HISTORY OF PRESENT ILLNESS:  I saw [name] in my office on the day of admission, and he was clearly having some difficulty breathing.  I had seen him on the day prior because he had been having cough and congestion for about a week.  He was started on antibiotics.  He was given a cough medication, but had trouble tolerating it, but when I saw him in my office, he was clearly short of breath.  Having difficulty managing his cough and congestion, and at that point, I started him on injectable Rocephin and Depo-Medrol.  He was started on Lasix because he had edema.  When he came back the next day, however, he was still very short of breath.  He said, he could not sleep.  He actually looked somewhat worse, and at that time it was elected to go ahead and admit him.  He has been having cough and congestion as mentioned.  He has had perhaps a low-grade fever.  He has noted edema.  His blood sugar and blood pressure have both been elevated.  PAST MEDICAL HISTORY:  Positive for hypertension, GERD, coronary artery bypass grafting.  He had a bioprosthetic aortic valve, and he has adult onset diabetes.  He does not have a history of overt congestive heart failure.  PAST SURGICAL HISTORY:  Positive for coronary artery bypass grafting and bilateral knee replacements.  He has also had prostate cancer surgery.  FAMILY HISTORY:  Positive for cancer in multiple family members.  SOCIAL HISTORY:  He lives at home with his wife.  He drinks alcohol occasionally.  He is an ex-cigarette smoker.  REVIEW OF SYSTEMS:  Except as mentioned is negative.  MEDICATION LIST:  As in the chart is  accurate.  PHYSICAL EXAMINATION:  VITAL SIGNS:  Blood pressure 153/74, pulse 58, temperature 97.3.  Weight 109.9 kilos.  O2 saturation 95% on oxygen. HEENT:  His pupils are reactive.  Nose and throat are clear.  Mucous membranes are moist.  He is mildly tender in the sinuses.  His mucous membranes are moist. NECK:  Supple without JVD. CHEST:  Marked bilateral rhonchi. HEART:  Regular. ABDOMEN:  Soft. EXTREMITIES:  He still has 1 to 2+ edema of the lower extremities.  ASSESSMENT:  He has, what I think is, pneumonia on chest x-ray.  He has known coronary artery occlusive disease and looks like he may have some congestive heart failure now.  He has hypertension.  His blood pressure is not as well controlled, and he has diabetes and his blood sugar is not as well controlled.  PLAN:  To admit him for IV treatment.  He failed outpatient treatment.     Terry Wu Terry Wu, M.D.     ELH/MEDQ  D:  02/12/2013  T:  02/12/2013  Job:  [id number]

## 2013-02-12 NOTE — Care Management Note (Addendum)
    Page 1 of 1   02/16/2013     11:11:32 AM   CARE MANAGEMENT NOTE 02/16/2013  Patient:  Terry Wu, Terry Wu   Account Number:  [id number]  Date Initiated:  02/12/2013  Documentation initiated by:  Sharrie Rothman  Subjective/Objective Assessment:   Pt admitted from home with pneumonia. Pt lives with his wife and will return home at discharge. Pt is independent with ADL's.     Action/Plan:   No CM needs noted at this time.   Anticipated DC Date:  02/16/2013   Anticipated DC Plan:  HOME/SELF CARE      DC Planning Services  CM consult      Choice offered to / List presented to:             Status of service:  Completed, signed off Medicare Important Message given?  YES (If response is "NO", the following Medicare IM given date fields will be blank) Date Medicare IM given:  02/13/2013 Date Additional Medicare IM given:  02/16/2013  Discharge Disposition:  HOME/SELF CARE  Per UR Regulation:    If discussed at Long Length of Stay Meetings, dates discussed:    Comments:  02/16/13 1110 Arlyss Queen, RN BSN CM Pt discharged home today. PT stated that pt not candidate at this time for [facility]  PT. Pt also does not qualify for home O2. No other CM needs noted.  02/13/13 1140 Arlyss Queen, RN BSN CM Pt potential discharge over the weekend. Pt has chosen [facility] if needs HH (but pt is very reluctant to [facility]) and if needs home O2 has chosen [facility] as well. Weekend staff to fax and call [facility] once orders written.  02/12/13 1040 Arlyss Queen, RN BSN CM

## 2013-02-12 NOTE — Progress Notes (Signed)
Subjective: He looks more comfortable. He is still coughing up a lot of sputum.  Objective: Vital signs in last 24 hours: Temp:  [97.3 F (36.3 C)-98.3 F (36.8 C)] 97.3 F (36.3 C) (08/21 0617) Pulse Rate:  [57-60] 58 (08/21 0617) Resp:  [20] 20 (08/21 0617) BP: (147-165)/(74-77) 153/74 mmHg (08/21 0617) SpO2:  [92 %-96 %] 96 % (08/21 0716) Weight:  [109.907 kg (242 lb 4.8 oz)] 109.907 kg (242 lb 4.8 oz) (08/21 0428) Weight change: -0.635 kg (-1 lb 6.4 oz) Last BM Date: 02/11/13  Intake/Output from previous day: 08/20 0701 - 08/21 0700 In: 2743.8 [P.O.:960; I.V.:1733.8; IV Piggyback:50] Out: 3000 [Urine:3000]  PHYSICAL EXAM General appearance: alert, cooperative and mild distress Resp: rhonchi bilaterally Cardio: regular rate and rhythm, S1, S2 normal, no murmur, click, rub or gallop GI: soft, non-tender; bowel sounds normal; no masses,  no organomegaly Extremities: His edema is last  Lab Results:    Basic Metabolic Panel:  Recent Labs  16/10/96 0509 02/12/13 0504  NA 120* 122*  K 3.7 3.7  CL 81* 84*  CO2 32 33*  GLUCOSE 119* 130*  BUN 13 11  CREATININE 0.74 0.63  CALCIUM 8.5 7.9*   Liver Function Tests:  Recent Labs  02/10/13 1302  AST 20  ALT 18  ALKPHOS 37*  BILITOT 0.4  PROT 7.1  ALBUMIN 3.5   No results found for this basename: LIPASE, AMYLASE,  in the last 72 hours No results found for this basename: AMMONIA,  in the last 72 hours CBC:  Recent Labs  02/10/13 1302  WBC 14.6*  NEUTROABS 12.0*  HGB 13.5  HCT 36.7*  MCV 86.6  PLT 228   Cardiac Enzymes: No results found for this basename: CKTOTAL, CKMB, CKMBINDEX, TROPONINI,  in the last 72 hours BNP:  Recent Labs  02/10/13 1302  PROBNP 486.0*   D-Dimer: No results found for this basename: DDIMER,  in the last 72 hours CBG:  Recent Labs  02/10/13 1218 02/11/13 2117 02/12/13 0730  GLUCAP 149* 166* 129*   Hemoglobin A1C: No results found for this basename: HGBA1C,  in the  last 72 hours Fasting Lipid Panel: No results found for this basename: CHOL, HDL, LDLCALC, TRIG, CHOLHDL, LDLDIRECT,  in the last 72 hours Thyroid Function Tests: No results found for this basename: TSH, T4TOTAL, FREET4, T3FREE, THYROIDAB,  in the last 72 hours Anemia Panel: No results found for this basename: VITAMINB12, FOLATE, FERRITIN, TIBC, IRON, RETICCTPCT,  in the last 72 hours Coagulation: No results found for this basename: LABPROT, INR,  in the last 72 hours Urine Drug Screen: Drugs of Abuse  No results found for this basename: labopia, cocainscrnur, labbenz, amphetmu, thcu, labbarb    Alcohol Level: No results found for this basename: ETH,  in the last 72 hours Urinalysis: No results found for this basename: COLORURINE, APPERANCEUR, LABSPEC, PHURINE, GLUCOSEU, HGBUR, BILIRUBINUR, KETONESUR, PROTEINUR, UROBILINOGEN, NITRITE, LEUKOCYTESUR,  in the last 72 hours Misc. Labs:  ABGS No results found for this basename: PHART, PCO2, PO2ART, TCO2, HCO3,  in the last 72 hours CULTURES No results found for this or any previous visit (from the past 240 hour(s)). Studies/Results: Dg Chest Port 1 View  02/11/2013   *RADIOLOGY REPORT*  Clinical Data: Pneumonia  PORTABLE CHEST - 1 VIEW  Comparison: Portable exam 0620 hours compared to 02/09/2013  Findings: Minimal enlargement of cardiac silhouette post CABG. Calcified thoracic aorta. Mild persistent atelectasis or infiltrate at left base. Remaining lungs clear. No gross pleural effusion or  pneumothorax.  IMPRESSION: Mild persistent atelectasis versus infiltrate at left base.   Original Report Authenticated By: Ulyses Southward, M.D.    Medications:  Prior to Admission:  Prescriptions prior to admission  Medication Sig Dispense Refill  . acetaminophen (TYLENOL) 500 MG tablet Take 500 mg by mouth every 6 (six) hours as needed.      [name] amLODipine (NORVASC) 10 MG tablet Take 10 mg by mouth daily.      [name] amoxicillin (AMOXIL) 500 MG capsule Take  2,000 mg by mouth as needed (prior to dental appt.).      [name] benzonatate (TESSALON) 100 MG capsule Take 200 mg by mouth 3 (three) times daily as needed for cough.      . carvedilol (COREG) 12.5 MG tablet Take 1 tablet (12.5 mg total) by mouth 2 (two) times daily with a meal.  60 tablet  12  . clopidogrel (PLAVIX) 75 MG tablet Take 75 mg by mouth daily.      . Coenzyme Q10 (CO Q 10) 100 MG CAPS Take 1 capsule by mouth every morning.      [name] glimepiride (AMARYL) 2 MG tablet Take 1 mg by mouth daily before breakfast.      . latanoprost (XALATAN) 0.005 % ophthalmic solution Place 1 drop into both eyes at bedtime.      . Multiple Vitamins-Minerals (ICAPS PO) Take 2 tablets by mouth 3 (three) times daily after meals.       . naproxen sodium (ALEVE) 220 MG tablet Take 220 mg by mouth 2 (two) times daily with a meal.      . pantoprazole (PROTONIX) 40 MG tablet Take 40 mg by mouth every evening.      . potassium chloride SA (K-DUR,KLOR-CON) 20 MEQ tablet Take 20 mEq by mouth 2 (two) times daily.      [name] telmisartan (MICARDIS) 20 MG tablet Take 20 mg by mouth daily at 12 noon.      . triamterene-hydrochlorothiazide (MAXZIDE-25) 37.5-25 MG per tablet Take 1 tablet by mouth daily.       Scheduled: . albuterol  2.5 mg Nebulization BID  . amLODipine  10 mg Oral Daily  . atorvastatin  10 mg Oral q1800  . azithromycin  500 mg Oral Daily  . carvedilol  12.5 mg Oral BID WC  . cefTRIAXone (ROCEPHIN)  IV  1 g Intravenous Q24H  . clopidogrel  75 mg Oral Q breakfast  . enoxaparin (LOVENOX) injection  40 mg Subcutaneous Q24H  . furosemide  40 mg Intravenous Q12H  . guaiFENesin  1,200 mg Oral BID  . insulin aspart  0-15 Units Subcutaneous TID WC  . irbesartan  300 mg Oral Daily  . latanoprost  1 drop Both Eyes QHS  . pantoprazole  40 mg Oral Daily  . potassium chloride SA  20 mEq Oral Daily  . sodium chloride  3 mL Intravenous Q12H  . sodium chloride  3 mL Intravenous Q12H   Continuous: . sodium chloride 75  mL/hr at 02/12/13 0901   VQQ:VZDGLO chloride, acetaminophen, acetaminophen, albuterol, ALPRAZolam, alum & mag hydroxide-simeth, benzonatate, HYDROcodone-acetaminophen, ondansetron (ZOFRAN) IV, ondansetron, sodium chloride, traZODone  Assesment: He was admitted with pneumonia. He seems to be improving. He has I think some element of diastolic CHF which is acute. He has hypertension and that's a little better. He has a bioprosthetic aortic valve replacement and has had coronary artery bypass surgery. He has diabetes which is stable. He has hyponatremia which is improved Active Problems:   *  No active hospital problems. *    Plan: Continue current treatments. He will have cardiology consultation.    LOS: 2 days   Rosina Cressler L 02/12/2013, 9:01 AM

## 2013-02-13 DIAGNOSIS — E871 Hypo-osmolality and hyponatremia: Secondary | ICD-10-CM | POA: Diagnosis not present

## 2013-02-13 DIAGNOSIS — I509 Heart failure, unspecified: Secondary | ICD-10-CM | POA: Diagnosis not present

## 2013-02-13 DIAGNOSIS — J189 Pneumonia, unspecified organism: Secondary | ICD-10-CM | POA: Diagnosis not present

## 2013-02-13 LAB — GLUCOSE, CAPILLARY
Glucose-Capillary: 218 mg/dL — ABNORMAL HIGH (ref 70–99)
Glucose-Capillary: 205 mg/dL — ABNORMAL HIGH (ref 70–99)

## 2013-02-13 LAB — BASIC METABOLIC PANEL
BUN: 11 mg/dL (ref 6–23)
GFR calc Af Amer: >90 mL/min (ref >90)
GFR calc non Af Amer: >90 mL/min (ref >90)
Potassium: 3.8 mEq/L (ref 3.5–5.1)

## 2013-02-13 MED ORDER — METHYLPREDNISOLONE SODIUM SUCC 40 MG IJ SOLR
40.0000 mg | Freq: Four times a day (QID) | INTRAMUSCULAR | Status: DC
  Administered 2013-02-13 – 2013-02-14 (×4): 40 mg via INTRAVENOUS
  Filled 2013-02-13 (×5): qty 1

## 2013-02-13 NOTE — Evaluation (Signed)
Physical Therapy Evaluation Patient Details Name: Terry Wu MRN: [id number] DOB: 23-Jul-1931 Today's Date: 02/13/2013 Time: 7829-5621 PT Time Calculation (min): 34 min  PT Assessment / Plan / Recommendation History of Present Illness  Pt is admitted with pneumonia.  He had been completely independent PTA, even played golf 2x/week.  Clinical Impression  Pt is seen for evaluation and found to have generalized deconditioning secondary to current illness.  He is now on supplemental O2 and needs  A walker for gait stabilization.  He felt much better following increased activity and should be able to transition to home at d/c.  He will probably need HHPT due to multiple steps in the home.  No DME should be needed.    PT Assessment  Patient needs continued PT services    Follow Up Recommendations  Home health PT    Does the patient have the potential to tolerate intense rehabilitation      Barriers to Discharge Inaccessible home environment split level home    Equipment Recommendations  None recommended by PT    Recommendations for Other Services     Frequency Min 3X/week    Precautions / Restrictions Precautions Precautions: None Restrictions Weight Bearing Restrictions: No   Pertinent Vitals/Pain       Mobility  Bed Mobility Bed Mobility: Supine to Sit Supine to Sit: 4: Min assist;HOB elevated Transfers Transfers: Sit to Stand;Stand to Sit Sit to Stand: 4: Min guard;From bed;From chair/3-in-1 Stand to Sit: 6: Modified independent (Device/Increase time);To chair/3-in-1;To bed Ambulation/Gait Ambulation/Gait Assistance: 5: Supervision Ambulation Distance (Feet): 120 Feet Assistive device: Rolling walker Ambulation/Gait Assistance Details: pt normally ambulates with no assistive device, but now feels unsteady due to deconditioning. Gait Pattern: Within Functional Limits Gait velocity: WNL Stairs: No Wheelchair Mobility Wheelchair Mobility: No    Exercises      PT Diagnosis: Difficulty walking;Generalized weakness  PT Problem List: Decreased activity tolerance;Decreased mobility;Decreased knowledge of use of DME;Cardiopulmonary status limiting activity PT Treatment Interventions: Gait training;Stair training     PT Goals(Current goals can be found in the care plan section) Acute Rehab PT Goals Patient Stated Goal: return home PT Goal Formulation: With patient Time For Goal Achievement: 02/13/13 Potential to Achieve Goals: Good  Visit Information  Last PT Received On: 02/13/13 History of Present Illness: Pt is admitted with pneumonia.  He had been completely independent PTA, even played golf 2x/week.       Prior Functioning  Home Living Family/patient expects to be discharged to:: Private residence Living Arrangements: Spouse/significant other Available Help at Discharge: Family;Available 24 hours/day Type of Home: House Home Access: Stairs to enter [employer] of Steps: 1 Entrance Stairs-Rails: None Home Layout: Multi-level Alternate Level Stairs-Number of Steps: 7 Alternate Level Stairs-Rails: Right Home Equipment: Walker - 2 wheels Prior Function Level of Independence: Independent Communication Communication: No difficulties    Cognition  Cognition Arousal/Alertness: Awake/alert Behavior During Therapy: WFL for tasks assessed/performed Overall Cognitive Status: Within Functional Limits for tasks assessed    Extremity/Trunk Assessment Lower Extremity Assessment Lower Extremity Assessment: Generalized weakness   Balance Balance Balance Assessed: No (WNL by functional observation)  End of Session PT - End of Session Equipment Utilized During Treatment: Gait belt;Oxygen Activity Tolerance: Patient tolerated treatment well Patient left: in chair;with call bell/phone within reach;with nursing/sitter in room Nurse Communication: Mobility status  GP     Konrad Penta 02/13/2013, 2:19 PM

## 2013-02-13 NOTE — Progress Notes (Signed)
Subjective: He says he feels much better. He was started on IV steroids last night. Cardiology help is noted and appreciated  Objective: Vital signs in last 24 hours: Temp:  [97.7 F (36.5 C)-97.8 F (36.6 C)] 97.8 F (36.6 C) (08/22 0550) Pulse Rate:  [59-70] 70 (08/22 0550) Resp:  [20] 20 (08/22 0550) BP: (131-156)/(67-78) 131/72 mmHg (08/22 0550) SpO2:  [92 %-96 %] 93 % (08/22 0726) Weight:  [108.682 kg (239 lb 9.6 oz)] 108.682 kg (239 lb 9.6 oz) (08/22 0409) Weight change: -1.225 kg (-2 lb 11.2 oz) Last BM Date: 02/11/13  Intake/Output from previous day: 08/21 0701 - 08/22 0700 In: 1665 [P.O.:840; I.V.:775; IV Piggyback:50] Out: 4725 [Urine:4725]  PHYSICAL EXAM General appearance: alert, cooperative and no distress Resp: rhonchi bilaterally Cardio: regular rate and rhythm, S1, S2 normal, no murmur, click, rub or gallop GI: soft, non-tender; bowel sounds normal; no masses,  no organomegaly Extremities: extremities normal, atraumatic, no cyanosis or edema  Lab Results:    Basic Metabolic Panel:  Recent Labs  04/54/09 0504 02/13/13 0440  NA 122* 122*  K 3.7 3.8  CL 84* 85*  CO2 33* 33*  GLUCOSE 130* 218*  BUN 11 11  CREATININE 0.63 0.65  CALCIUM 7.9* 8.4   Liver Function Tests:  Recent Labs  02/10/13 1302  AST 20  ALT 18  ALKPHOS 37*  BILITOT 0.4  PROT 7.1  ALBUMIN 3.5   No results found for this basename: LIPASE, AMYLASE,  in the last 72 hours No results found for this basename: AMMONIA,  in the last 72 hours CBC:  Recent Labs  02/10/13 1302  WBC 14.6*  NEUTROABS 12.0*  HGB 13.5  HCT 36.7*  MCV 86.6  PLT 228   Cardiac Enzymes: No results found for this basename: CKTOTAL, CKMB, CKMBINDEX, TROPONINI,  in the last 72 hours BNP:  Recent Labs  02/10/13 1302  PROBNP 486.0*   D-Dimer: No results found for this basename: DDIMER,  in the last 72 hours CBG:  Recent Labs  02/11/13 2117 02/12/13 0730 02/12/13 1147 02/12/13 1645  02/12/13 2103 02/13/13 0721  GLUCAP 166* 129* 158* 160* 163* 191*   Hemoglobin A1C: No results found for this basename: HGBA1C,  in the last 72 hours Fasting Lipid Panel: No results found for this basename: CHOL, HDL, LDLCALC, TRIG, CHOLHDL, LDLDIRECT,  in the last 72 hours Thyroid Function Tests: No results found for this basename: TSH, T4TOTAL, FREET4, T3FREE, THYROIDAB,  in the last 72 hours Anemia Panel: No results found for this basename: VITAMINB12, FOLATE, FERRITIN, TIBC, IRON, RETICCTPCT,  in the last 72 hours Coagulation: No results found for this basename: LABPROT, INR,  in the last 72 hours Urine Drug Screen: Drugs of Abuse  No results found for this basename: labopia, cocainscrnur, labbenz, amphetmu, thcu, labbarb    Alcohol Level: No results found for this basename: ETH,  in the last 72 hours Urinalysis: No results found for this basename: COLORURINE, APPERANCEUR, LABSPEC, PHURINE, GLUCOSEU, HGBUR, BILIRUBINUR, KETONESUR, PROTEINUR, UROBILINOGEN, NITRITE, LEUKOCYTESUR,  in the last 72 hours Misc. Labs:  ABGS No results found for this basename: PHART, PCO2, PO2ART, TCO2, HCO3,  in the last 72 hours CULTURES No results found for this or any previous visit (from the past 240 hour(s)). Studies/Results: No results found.  Medications:  Prior to Admission:  Prescriptions prior to admission  Medication Sig Dispense Refill  . acetaminophen (TYLENOL) 500 MG tablet Take 500 mg by mouth every 6 (six) hours as needed.      [name]  amLODipine (NORVASC) 10 MG tablet Take 10 mg by mouth daily.      [name] amoxicillin (AMOXIL) 500 MG capsule Take 2,000 mg by mouth as needed (prior to dental appt.).      [name] benzonatate (TESSALON) 100 MG capsule Take 200 mg by mouth 3 (three) times daily as needed for cough.      . carvedilol (COREG) 12.5 MG tablet Take 1 tablet (12.5 mg total) by mouth 2 (two) times daily with a meal.  60 tablet  12  . clopidogrel (PLAVIX) 75 MG tablet Take 75 mg by mouth  daily.      . Coenzyme Q10 (CO Q 10) 100 MG CAPS Take 1 capsule by mouth every morning.      [name] glimepiride (AMARYL) 2 MG tablet Take 1 mg by mouth daily before breakfast.      . latanoprost (XALATAN) 0.005 % ophthalmic solution Place 1 drop into both eyes at bedtime.      . Multiple Vitamins-Minerals (ICAPS PO) Take 2 tablets by mouth 3 (three) times daily after meals.       . naproxen sodium (ALEVE) 220 MG tablet Take 220 mg by mouth 2 (two) times daily with a meal.      . pantoprazole (PROTONIX) 40 MG tablet Take 40 mg by mouth every evening.      . potassium chloride SA (K-DUR,KLOR-CON) 20 MEQ tablet Take 20 mEq by mouth 2 (two) times daily.      [name] telmisartan (MICARDIS) 20 MG tablet Take 20 mg by mouth daily at 12 noon.      . triamterene-hydrochlorothiazide (MAXZIDE-25) 37.5-25 MG per tablet Take 1 tablet by mouth daily.       Scheduled: . albuterol  2.5 mg Nebulization BID  . amLODipine  10 mg Oral Daily  . atorvastatin  10 mg Oral q1800  . azithromycin  500 mg Oral Daily  . carvedilol  12.5 mg Oral BID WC  . cefTRIAXone (ROCEPHIN)  IV  1 g Intravenous Q24H  . clopidogrel  75 mg Oral Q breakfast  . enoxaparin (LOVENOX) injection  40 mg Subcutaneous Q24H  . guaiFENesin  1,200 mg Oral BID  . insulin aspart  0-15 Units Subcutaneous TID WC  . irbesartan  300 mg Oral Daily  . latanoprost  1 drop Both Eyes QHS  . methylPREDNISolone (SOLU-MEDROL) injection  40 mg Intravenous Q6H  . pantoprazole  40 mg Oral Daily  . potassium chloride SA  20 mEq Oral Daily  . sodium chloride  3 mL Intravenous Q12H  . sodium chloride  3 mL Intravenous Q12H  . triamterene-hydrochlorothiazide  1 tablet Oral Daily   Continuous: . sodium chloride 75 mL/hr at 02/12/13 2332   IHK:[id number] chloride, acetaminophen, acetaminophen, albuterol, ALPRAZolam, alum & mag hydroxide-simeth, benzonatate, HYDROcodone-acetaminophen, ondansetron (ZOFRAN) IV, ondansetron, sodium chloride, traZODone  Assesment: He has  community-acquired pneumonia and is improving. He is still hyponatremic. He has some element of diastolic heart failure which seems to be better. Principal Problem:   CAP (community acquired pneumonia) Active Problems:   AODM   HYPERTENSION, BENIGN   AORTIC VALVE REPLACEMENT, HX OF   CORONARY ARTERY BYPASS GRAFT, HX OF   Hyponatremia   COPD (chronic obstructive pulmonary disease)   Bilateral leg edema    Plan: Continue IV steroids but reduce the dose. Continue other treatments. Physical therapy consult to try to get him up and moving    LOS: 3 days   Skylah Delauter L 02/13/2013, 8:45 AM

## 2013-02-14 LAB — BASIC METABOLIC PANEL
CO2: 34 mEq/L — ABNORMAL HIGH (ref 19–32)
Chloride: 91 mEq/L — ABNORMAL LOW (ref 96–112)
Creatinine, Ser: 0.61 mg/dL (ref 0.50–1.35)
Potassium: 3.6 mEq/L (ref 3.5–5.1)
Sodium: 130 mEq/L — ABNORMAL LOW (ref 135–145)

## 2013-02-14 LAB — GLUCOSE, CAPILLARY: Glucose-Capillary: 189 mg/dL — ABNORMAL HIGH (ref 70–99)

## 2013-02-14 MED ORDER — PREDNISONE 20 MG PO TABS
40.0000 mg | ORAL_TABLET | Freq: Every day | ORAL | Status: DC
  Administered 2013-02-15 – 2013-02-16 (×2): 40 mg via ORAL
  Filled 2013-02-14 (×2): qty 2

## 2013-02-14 NOTE — Progress Notes (Signed)
Subjective: He says he feels much better today. He has no new complaints. He had PT evaluation and home health physical therapy was recommended. He says he does not think he needs that.  Objective: Vital signs in last 24 hours: Temp:  [97.7 F (36.5 C)-98.3 F (36.8 C)] 98.3 F (36.8 C) (08/23 0549) Pulse Rate:  [60-64] 64 (08/23 0549) Resp:  [20] 20 (08/23 0549) BP: (149-170)/(66-78) 157/78 mmHg (08/23 0549) SpO2:  [95 %-97 %] 96 % (08/23 0803) Weight:  [106.2 kg (234 lb 2.1 oz)] 106.2 kg (234 lb 2.1 oz) (08/23 0423) Weight change: -2.482 kg (-5 lb 7.5 oz) Last BM Date: 02/13/13  Intake/Output from previous day: 08/22 0701 - 08/23 0700 In: 3445 [P.O.:680; I.V.:2715; IV Piggyback:50] Out: 3000 [Urine:3000]  PHYSICAL EXAM General appearance: alert, cooperative and no distress Resp: clear to auscultation bilaterally Cardio: regular rate and rhythm, S1, S2 normal, no murmur, click, rub or gallop GI: soft, non-tender; bowel sounds normal; no masses,  no organomegaly Extremities: extremities normal, atraumatic, no cyanosis or edema  Lab Results:    Basic Metabolic Panel:  Recent Labs  16/10/96 0440 02/14/13 0605  NA 122* 130*  K 3.8 3.6  CL 85* 91*  CO2 33* 34*  GLUCOSE 218* 217*  BUN 11 12  CREATININE 0.65 0.61  CALCIUM 8.4 8.7   Liver Function Tests: No results found for this basename: AST, ALT, ALKPHOS, BILITOT, PROT, ALBUMIN,  in the last 72 hours No results found for this basename: LIPASE, AMYLASE,  in the last 72 hours No results found for this basename: AMMONIA,  in the last 72 hours CBC: No results found for this basename: WBC, NEUTROABS, HGB, HCT, MCV, PLT,  in the last 72 hours Cardiac Enzymes: No results found for this basename: CKTOTAL, CKMB, CKMBINDEX, TROPONINI,  in the last 72 hours BNP: No results found for this basename: PROBNP,  in the last 72 hours D-Dimer: No results found for this basename: DDIMER,  in the last 72 hours CBG:  Recent Labs   02/12/13 2103 02/13/13 0721 02/13/13 1151 02/13/13 1703 02/13/13 2223 02/14/13 0752  GLUCAP 163* 191* 239* 218* 205* 223*   Hemoglobin A1C: No results found for this basename: HGBA1C,  in the last 72 hours Fasting Lipid Panel: No results found for this basename: CHOL, HDL, LDLCALC, TRIG, CHOLHDL, LDLDIRECT,  in the last 72 hours Thyroid Function Tests: No results found for this basename: TSH, T4TOTAL, FREET4, T3FREE, THYROIDAB,  in the last 72 hours Anemia Panel: No results found for this basename: VITAMINB12, FOLATE, FERRITIN, TIBC, IRON, RETICCTPCT,  in the last 72 hours Coagulation: No results found for this basename: LABPROT, INR,  in the last 72 hours Urine Drug Screen: Drugs of Abuse  No results found for this basename: labopia, cocainscrnur, labbenz, amphetmu, thcu, labbarb    Alcohol Level: No results found for this basename: ETH,  in the last 72 hours Urinalysis: No results found for this basename: COLORURINE, APPERANCEUR, LABSPEC, PHURINE, GLUCOSEU, HGBUR, BILIRUBINUR, KETONESUR, PROTEINUR, UROBILINOGEN, NITRITE, LEUKOCYTESUR,  in the last 72 hours Misc. Labs:  ABGS No results found for this basename: PHART, PCO2, PO2ART, TCO2, HCO3,  in the last 72 hours CULTURES No results found for this or any previous visit (from the past 240 hour(s)). Studies/Results: No results found.  Medications:  Prior to Admission:  Prescriptions prior to admission  Medication Sig Dispense Refill  . acetaminophen (TYLENOL) 500 MG tablet Take 500 mg by mouth every 6 (six) hours as needed.      [name]  amLODipine (NORVASC) 10 MG tablet Take 10 mg by mouth daily.      [name] amoxicillin (AMOXIL) 500 MG capsule Take 2,000 mg by mouth as needed (prior to dental appt.).      [name] benzonatate (TESSALON) 100 MG capsule Take 200 mg by mouth 3 (three) times daily as needed for cough.      . carvedilol (COREG) 12.5 MG tablet Take 1 tablet (12.5 mg total) by mouth 2 (two) times daily with a meal.  60 tablet  12   . clopidogrel (PLAVIX) 75 MG tablet Take 75 mg by mouth daily.      . Coenzyme Q10 (CO Q 10) 100 MG CAPS Take 1 capsule by mouth every morning.      [name] glimepiride (AMARYL) 2 MG tablet Take 1 mg by mouth daily before breakfast.      . latanoprost (XALATAN) 0.005 % ophthalmic solution Place 1 drop into both eyes at bedtime.      . Multiple Vitamins-Minerals (ICAPS PO) Take 2 tablets by mouth 3 (three) times daily after meals.       . naproxen sodium (ALEVE) 220 MG tablet Take 220 mg by mouth 2 (two) times daily with a meal.      . pantoprazole (PROTONIX) 40 MG tablet Take 40 mg by mouth every evening.      . potassium chloride SA (K-DUR,KLOR-CON) 20 MEQ tablet Take 20 mEq by mouth 2 (two) times daily.      [name] telmisartan (MICARDIS) 20 MG tablet Take 20 mg by mouth daily at 12 noon.      . triamterene-hydrochlorothiazide (MAXZIDE-25) 37.5-25 MG per tablet Take 1 tablet by mouth daily.       Scheduled: . albuterol  2.5 mg Nebulization BID  . amLODipine  10 mg Oral Daily  . atorvastatin  10 mg Oral q1800  . azithromycin  500 mg Oral Daily  . carvedilol  12.5 mg Oral BID WC  . cefTRIAXone (ROCEPHIN)  IV  1 g Intravenous Q24H  . clopidogrel  75 mg Oral Q breakfast  . enoxaparin (LOVENOX) injection  40 mg Subcutaneous Q24H  . guaiFENesin  1,200 mg Oral BID  . insulin aspart  0-15 Units Subcutaneous TID WC  . irbesartan  300 mg Oral Daily  . latanoprost  1 drop Both Eyes QHS  . pantoprazole  40 mg Oral Daily  . potassium chloride SA  20 mEq Oral Daily  . [START ON 02/15/2013] predniSONE  40 mg Oral Q breakfast  . sodium chloride  3 mL Intravenous Q12H  . sodium chloride  3 mL Intravenous Q12H  . triamterene-hydrochlorothiazide  1 tablet Oral Daily   Continuous:  [id number] chloride, acetaminophen, acetaminophen, albuterol, ALPRAZolam, alum & mag hydroxide-simeth, benzonatate, HYDROcodone-acetaminophen, ondansetron (ZOFRAN) IV, ondansetron, sodium chloride, traZODone  Assesment: He has  community-acquired pneumonia and had multiple complications. He has had what I think is some diastolic congestive heart failure. He has some COPD. His blood pressure was up. His blood sugar has been up. His sodium was very low but he is markedly improved today Principal Problem:   CAP (community acquired pneumonia) Active Problems:   AODM   HYPERTENSION, BENIGN   AORTIC VALVE REPLACEMENT, HX OF   CORONARY ARTERY BYPASS GRAFT, HX OF   Hyponatremia   COPD (chronic obstructive pulmonary disease)   Bilateral leg edema    Plan: Switch him to saline lock. Change his steroids to by mouth. Try to be more active.    LOS: 4 days  Emmerson Taddei L 02/14/2013, 9:24 AM

## 2013-02-15 LAB — GLUCOSE, CAPILLARY
Glucose-Capillary: 143 mg/dL — ABNORMAL HIGH (ref 70–99)
Glucose-Capillary: 182 mg/dL — ABNORMAL HIGH (ref 70–99)
Glucose-Capillary: 202 mg/dL — ABNORMAL HIGH (ref 70–99)
Glucose-Capillary: 171 mg/dL — ABNORMAL HIGH (ref 70–99)

## 2013-02-15 NOTE — Progress Notes (Signed)
Subjective: He says he feels better. He is coughing but only when he coughs up sputum now. He has less shortness of breath. He has less edema. He has been ambulating in the hall.  Objective: Vital signs in last 24 hours: Temp:  [97.4 F (36.3 C)-97.8 F (36.6 C)] 97.4 F (36.3 C) (08/24 0446) Pulse Rate:  [57-60] 59 (08/24 0446) Resp:  [20] 20 (08/24 0446) BP: (147-177)/(65-68) 177/68 mmHg (08/24 0446) SpO2:  [94 %-98 %] 94 % (08/24 0824) Weight:  [106.5 kg (234 lb 12.6 oz)] 106.5 kg (234 lb 12.6 oz) (08/24 0446) Weight change: 0.3 kg (10.6 oz) Last BM Date: 02/13/13  Intake/Output from previous day: 08/23 0701 - 08/24 0700 In: 1355 [P.O.:480; I.V.:825; IV Piggyback:50] Out: 2100 [Urine:2100]  PHYSICAL EXAM General appearance: alert, cooperative and mild distress Resp: clear to auscultation bilaterally Cardio: regular rate and rhythm, S1, S2 normal, no murmur, click, rub or gallop GI: soft, non-tender; bowel sounds normal; no masses,  no organomegaly Extremities: extremities normal, atraumatic, no cyanosis or edema  Lab Results:    Basic Metabolic Panel:  Recent Labs  29/56/21 0440 02/14/13 0605  NA 122* 130*  K 3.8 3.6  CL 85* 91*  CO2 33* 34*  GLUCOSE 218* 217*  BUN 11 12  CREATININE 0.65 0.61  CALCIUM 8.4 8.7   Liver Function Tests: No results found for this basename: AST, ALT, ALKPHOS, BILITOT, PROT, ALBUMIN,  in the last 72 hours No results found for this basename: LIPASE, AMYLASE,  in the last 72 hours No results found for this basename: AMMONIA,  in the last 72 hours CBC: No results found for this basename: WBC, NEUTROABS, HGB, HCT, MCV, PLT,  in the last 72 hours Cardiac Enzymes: No results found for this basename: CKTOTAL, CKMB, CKMBINDEX, TROPONINI,  in the last 72 hours BNP: No results found for this basename: PROBNP,  in the last 72 hours D-Dimer: No results found for this basename: DDIMER,  in the last 72 hours CBG:  Recent Labs   02/13/13 2223 02/14/13 0752 02/14/13 1138 02/14/13 1651 02/14/13 2148 02/15/13 0730  GLUCAP 205* 223* 206* 162* 189* 143*   Hemoglobin A1C: No results found for this basename: HGBA1C,  in the last 72 hours Fasting Lipid Panel: No results found for this basename: CHOL, HDL, LDLCALC, TRIG, CHOLHDL, LDLDIRECT,  in the last 72 hours Thyroid Function Tests: No results found for this basename: TSH, T4TOTAL, FREET4, T3FREE, THYROIDAB,  in the last 72 hours Anemia Panel: No results found for this basename: VITAMINB12, FOLATE, FERRITIN, TIBC, IRON, RETICCTPCT,  in the last 72 hours Coagulation: No results found for this basename: LABPROT, INR,  in the last 72 hours Urine Drug Screen: Drugs of Abuse  No results found for this basename: labopia, cocainscrnur, labbenz, amphetmu, thcu, labbarb    Alcohol Level: No results found for this basename: ETH,  in the last 72 hours Urinalysis: No results found for this basename: COLORURINE, APPERANCEUR, LABSPEC, PHURINE, GLUCOSEU, HGBUR, BILIRUBINUR, KETONESUR, PROTEINUR, UROBILINOGEN, NITRITE, LEUKOCYTESUR,  in the last 72 hours Misc. Labs:  ABGS No results found for this basename: PHART, PCO2, PO2ART, TCO2, HCO3,  in the last 72 hours CULTURES No results found for this or any previous visit (from the past 240 hour(s)). Studies/Results: No results found.  Medications:  Prior to Admission:  Prescriptions prior to admission  Medication Sig Dispense Refill  . acetaminophen (TYLENOL) 500 MG tablet Take 500 mg by mouth every 6 (six) hours as needed.      [name]  amLODipine (NORVASC) 10 MG tablet Take 10 mg by mouth daily.      [name] amoxicillin (AMOXIL) 500 MG capsule Take 2,000 mg by mouth as needed (prior to dental appt.).      [name] benzonatate (TESSALON) 100 MG capsule Take 200 mg by mouth 3 (three) times daily as needed for cough.      . carvedilol (COREG) 12.5 MG tablet Take 1 tablet (12.5 mg total) by mouth 2 (two) times daily with a meal.  60 tablet  12   . clopidogrel (PLAVIX) 75 MG tablet Take 75 mg by mouth daily.      . Coenzyme Q10 (CO Q 10) 100 MG CAPS Take 1 capsule by mouth every morning.      [name] glimepiride (AMARYL) 2 MG tablet Take 1 mg by mouth daily before breakfast.      . latanoprost (XALATAN) 0.005 % ophthalmic solution Place 1 drop into both eyes at bedtime.      . Multiple Vitamins-Minerals (ICAPS PO) Take 2 tablets by mouth 3 (three) times daily after meals.       . naproxen sodium (ALEVE) 220 MG tablet Take 220 mg by mouth 2 (two) times daily with a meal.      . pantoprazole (PROTONIX) 40 MG tablet Take 40 mg by mouth every evening.      . potassium chloride SA (K-DUR,KLOR-CON) 20 MEQ tablet Take 20 mEq by mouth 2 (two) times daily.      [name] telmisartan (MICARDIS) 20 MG tablet Take 20 mg by mouth daily at 12 noon.      . triamterene-hydrochlorothiazide (MAXZIDE-25) 37.5-25 MG per tablet Take 1 tablet by mouth daily.       Scheduled: . albuterol  2.5 mg Nebulization BID  . amLODipine  10 mg Oral Daily  . atorvastatin  10 mg Oral q1800  . azithromycin  500 mg Oral Daily  . carvedilol  12.5 mg Oral BID WC  . cefTRIAXone (ROCEPHIN)  IV  1 g Intravenous Q24H  . clopidogrel  75 mg Oral Q breakfast  . enoxaparin (LOVENOX) injection  40 mg Subcutaneous Q24H  . guaiFENesin  1,200 mg Oral BID  . insulin aspart  0-15 Units Subcutaneous TID WC  . irbesartan  300 mg Oral Daily  . latanoprost  1 drop Both Eyes QHS  . pantoprazole  40 mg Oral Daily  . potassium chloride SA  20 mEq Oral Daily  . predniSONE  40 mg Oral Q breakfast  . sodium chloride  3 mL Intravenous Q12H  . triamterene-hydrochlorothiazide  1 tablet Oral Daily   Continuous:  [id number]:[id number] chloride, acetaminophen, acetaminophen, albuterol, ALPRAZolam, alum & mag hydroxide-simeth, benzonatate, HYDROcodone-acetaminophen, ondansetron (ZOFRAN) IV, ondansetron, sodium chloride, traZODone  Assesment: He was admitted with community-acquired pneumonia with multiple complications  including some element of diastolic heart failure. He has some COPD. His blood pressure had not been controlled. His sodium was very low. He is improving Principal Problem:   CAP (community acquired pneumonia) Active Problems:   AODM   HYPERTENSION, BENIGN   AORTIC VALVE REPLACEMENT, HX OF   CORONARY ARTERY BYPASS GRAFT, HX OF   Hyponatremia   COPD (chronic obstructive pulmonary disease)   Bilateral leg edema    Plan: He will probably be be able to be discharged home tomorrow. Continue current medications. Recheck basic metabolic profile in the morning    LOS: 5 days   Sarah Baez L 02/15/2013, 10:05 AM

## 2013-02-16 DIAGNOSIS — I509 Heart failure, unspecified: Secondary | ICD-10-CM | POA: Diagnosis not present

## 2013-02-16 DIAGNOSIS — J189 Pneumonia, unspecified organism: Secondary | ICD-10-CM | POA: Diagnosis not present

## 2013-02-16 DIAGNOSIS — E871 Hypo-osmolality and hyponatremia: Secondary | ICD-10-CM | POA: Diagnosis not present

## 2013-02-16 LAB — CBC WITH DIFFERENTIAL/PLATELET
Basophils Absolute: 0 10*3/uL (ref 0.0–0.1)
Lymphocytes Relative: 15 % (ref 12–46)
Neutro Abs: 9.2 10*3/uL — ABNORMAL HIGH (ref 1.7–7.7)
Platelets: 215 10*3/uL (ref 150–400)
RDW: 12.6 % (ref 11.5–15.5)
WBC: 12.3 10*3/uL — ABNORMAL HIGH (ref 4.0–10.5)

## 2013-02-16 LAB — BASIC METABOLIC PANEL
Calcium: 8.6 mg/dL (ref 8.4–10.5)
Creatinine, Ser: 0.74 mg/dL (ref 0.50–1.35)
GFR calc Af Amer: >90 mL/min (ref >90)
GFR calc non Af Amer: 85 mL/min — ABNORMAL LOW (ref >90)

## 2013-02-16 MED ORDER — AZITHROMYCIN 250 MG PO TABS: ORAL_TABLET | ORAL | Status: AC

## 2013-02-16 MED ORDER — GLIMEPIRIDE 2 MG PO TABS: 2.0000 mg | ORAL_TABLET | Freq: Every day | ORAL | Status: DC

## 2013-02-16 MED ORDER — TELMISARTAN 40 MG PO TABS: 40.0000 mg | ORAL_TABLET | Freq: Two times a day (BID) | ORAL | Status: DC

## 2013-02-16 NOTE — Progress Notes (Addendum)
States understanding of discharge instructions,prescriptions given. 02 sat on ra 93%

## 2013-02-16 NOTE — Progress Notes (Signed)
UR chart review completed.  

## 2013-02-16 NOTE — Progress Notes (Signed)
Subjective: He feels well and wants to go home. He does not want home health but he did agree to see if it was still necessary  Objective: Vital signs in last 24 hours: Temp:  [98.1 F (36.7 C)-98.9 F (37.2 C)] 98.1 F (36.7 C) (08/25 0513) Pulse Rate:  [57-61] 58 (08/25 0513) Resp:  [20] 20 (08/25 0513) BP: (135-155)/(62-76) 155/68 mmHg (08/25 0513) SpO2:  [93 %-97 %] 97 % (08/25 0652) Weight:  [103.7 kg (228 lb 9.9 oz)] 103.7 kg (228 lb 9.9 oz) (08/25 0513) Weight change: -2.8 kg (-6 lb 2.8 oz) Last BM Date: 02/13/13  Intake/Output from previous day: 08/24 0701 - 08/25 0700 In: 510 [P.O.:460; IV Piggyback:50] Out: 2325 [Urine:2325]  PHYSICAL EXAM General appearance: alert, cooperative and no distress Resp: rhonchi bilaterally Cardio: regular rate and rhythm, S1, S2 normal, no murmur, click, rub or gallop GI: soft, non-tender; bowel sounds normal; no masses,  no organomegaly Extremities: extremities normal, atraumatic, no cyanosis or edema  Lab Results:    Basic Metabolic Panel:  Recent Labs  16/10/96 0605 02/16/13 0532  NA 130* 133*  K 3.6 3.7  CL 91* 93*  CO2 34* 35*  GLUCOSE 217* 154*  BUN 12 17  CREATININE 0.61 0.74  CALCIUM 8.7 8.6   Liver Function Tests: No results found for this basename: AST, ALT, ALKPHOS, BILITOT, PROT, ALBUMIN,  in the last 72 hours No results found for this basename: LIPASE, AMYLASE,  in the last 72 hours No results found for this basename: AMMONIA,  in the last 72 hours CBC:  Recent Labs  02/16/13 0532  WBC 12.3*  NEUTROABS 9.2*  HGB 13.4  HCT 38.3*  MCV 91.0  PLT 215   Cardiac Enzymes: No results found for this basename: CKTOTAL, CKMB, CKMBINDEX, TROPONINI,  in the last 72 hours BNP: No results found for this basename: PROBNP,  in the last 72 hours D-Dimer: No results found for this basename: DDIMER,  in the last 72 hours CBG:  Recent Labs  02/14/13 2148 02/15/13 0730 02/15/13 1204 02/15/13 1658  02/15/13 2146 02/16/13 0809  GLUCAP 189* 143* 182* 202* 171* 127*   Hemoglobin A1C: No results found for this basename: HGBA1C,  in the last 72 hours Fasting Lipid Panel: No results found for this basename: CHOL, HDL, LDLCALC, TRIG, CHOLHDL, LDLDIRECT,  in the last 72 hours Thyroid Function Tests: No results found for this basename: TSH, T4TOTAL, FREET4, T3FREE, THYROIDAB,  in the last 72 hours Anemia Panel: No results found for this basename: VITAMINB12, FOLATE, FERRITIN, TIBC, IRON, RETICCTPCT,  in the last 72 hours Coagulation: No results found for this basename: LABPROT, INR,  in the last 72 hours Urine Drug Screen: Drugs of Abuse  No results found for this basename: labopia, cocainscrnur, labbenz, amphetmu, thcu, labbarb    Alcohol Level: No results found for this basename: ETH,  in the last 72 hours Urinalysis: No results found for this basename: COLORURINE, APPERANCEUR, LABSPEC, PHURINE, GLUCOSEU, HGBUR, BILIRUBINUR, KETONESUR, PROTEINUR, UROBILINOGEN, NITRITE, LEUKOCYTESUR,  in the last 72 hours Misc. Labs:  ABGS No results found for this basename: PHART, PCO2, PO2ART, TCO2, HCO3,  in the last 72 hours CULTURES No results found for this or any previous visit (from the past 240 hour(s)). Studies/Results: No results found.  Medications:  Prior to Admission:  Prescriptions prior to admission  Medication Sig Dispense Refill  . acetaminophen (TYLENOL) 500 MG tablet Take 500 mg by mouth every 6 (six) hours as needed.      [name]  amLODipine (NORVASC) 10 MG tablet Take 10 mg by mouth daily.      [name] amoxicillin (AMOXIL) 500 MG capsule Take 2,000 mg by mouth as needed (prior to dental appt.).      [name] benzonatate (TESSALON) 100 MG capsule Take 200 mg by mouth 3 (three) times daily as needed for cough.      . carvedilol (COREG) 12.5 MG tablet Take 1 tablet (12.5 mg total) by mouth 2 (two) times daily with a meal.  60 tablet  12  . clopidogrel (PLAVIX) 75 MG tablet Take 75 mg by mouth  daily.      . Coenzyme Q10 (CO Q 10) 100 MG CAPS Take 1 capsule by mouth every morning.      . latanoprost (XALATAN) 0.005 % ophthalmic solution Place 1 drop into both eyes at bedtime.      . Multiple Vitamins-Minerals (ICAPS PO) Take 2 tablets by mouth 3 (three) times daily after meals.       . naproxen sodium (ALEVE) 220 MG tablet Take 220 mg by mouth 2 (two) times daily with a meal.      . pantoprazole (PROTONIX) 40 MG tablet Take 40 mg by mouth every evening.      . potassium chloride SA (K-DUR,KLOR-CON) 20 MEQ tablet Take 20 mEq by mouth 2 (two) times daily.      . [DISCONTINUED] glimepiride (AMARYL) 2 MG tablet Take 1 mg by mouth daily before breakfast.      . [DISCONTINUED] telmisartan (MICARDIS) 20 MG tablet Take 20 mg by mouth daily at 12 noon.      . [DISCONTINUED] triamterene-hydrochlorothiazide (MAXZIDE-25) 37.5-25 MG per tablet Take 1 tablet by mouth daily.       Scheduled: . albuterol  2.5 mg Nebulization BID  . amLODipine  10 mg Oral Daily  . atorvastatin  10 mg Oral q1800  . azithromycin  500 mg Oral Daily  . carvedilol  12.5 mg Oral BID WC  . cefTRIAXone (ROCEPHIN)  IV  1 g Intravenous Q24H  . clopidogrel  75 mg Oral Q breakfast  . enoxaparin (LOVENOX) injection  40 mg Subcutaneous Q24H  . guaiFENesin  1,200 mg Oral BID  . insulin aspart  0-15 Units Subcutaneous TID WC  . irbesartan  300 mg Oral Daily  . latanoprost  1 drop Both Eyes QHS  . pantoprazole  40 mg Oral Daily  . potassium chloride SA  20 mEq Oral Daily  . predniSONE  40 mg Oral Q breakfast  . sodium chloride  3 mL Intravenous Q12H  . triamterene-hydrochlorothiazide  1 tablet Oral Daily   Continuous:  MVH:QIONGE chloride, acetaminophen, acetaminophen, albuterol, ALPRAZolam, alum & mag hydroxide-simeth, benzonatate, HYDROcodone-acetaminophen, ondansetron (ZOFRAN) IV, ondansetron, sodium chloride, traZODone  Assesment: He was admitted with community-acquired pneumonia and is better. He is ready for discharge  now. Principal Problem:   CAP (community acquired pneumonia) Active Problems:   AODM   HYPERTENSION, BENIGN   AORTIC VALVE REPLACEMENT, HX OF   CORONARY ARTERY BYPASS GRAFT, HX OF   Hyponatremia   COPD (chronic obstructive pulmonary disease)   Bilateral leg edema    Plan: Discharge home please see discharge summary for details    LOS: 6 days   Terry Wu L 02/16/2013, 8:54 AM

## 2013-02-16 NOTE — Progress Notes (Signed)
Physical Therapy Treatment Patient Details Name: Terry Wu MRN: [id number] DOB: 28-Mar-1932 Today's Date: 02/16/2013 Time: 4098-1191 PT Time Calculation (min): 26 min  PT Assessment / Plan / Recommendation  History of Present Illness     PT Comments   Pt is now transferring and ambulating with no assistance except for a walker.  He states that he feels a bit more secure with its' use.  He is independent on climbing steps. O2 sat on RA is 92%.  No further PT is needed.  Follow Up Recommendations  No PT follow up     Does the patient have the potential to tolerate intense rehabilitation     Barriers to Discharge        Equipment Recommendations       Recommendations for Other Services    Frequency     Progress towards PT Goals Progress towards PT goals: Goals met/education completed, patient discharged from PT  Plan Discharge plan needs to be updated    Precautions / Restrictions Restrictions Weight Bearing Restrictions: No   Pertinent Vitals/Pain     Mobility  Bed Mobility Supine to Sit: 6: Modified independent (Device/Increase time) Transfers Sit to Stand: 6: Modified independent (Device/Increase time);From bed Stand to Sit: 6: Modified independent (Device/Increase time);To bed Ambulation/Gait Ambulation/Gait Assistance: 6: Modified independent (Device/Increase time) Ambulation Distance (Feet): 350 Feet Assistive device: Rolling walker Gait Pattern: Within Functional Limits Gait velocity: WNL Stairs: Yes Stairs Assistance: 6: Modified independent (Device/Increase time) Stair Management Technique: One rail Right;Step to pattern;Forwards Number of Stairs: 10 Wheelchair Mobility Wheelchair Mobility: No    Exercises     PT Diagnosis:    PT Problem List:   PT Treatment Interventions:     PT Goals (current goals can now be found in the care plan section)    Visit Information  Last PT Received On: 02/16/13    Subjective Data      Cognition        Balance     End of Session PT - End of Session Equipment Utilized During Treatment: Gait belt Activity Tolerance: Patient tolerated treatment well Patient left: in bed Nurse Communication: Mobility status   GP     Konrad Penta 02/16/2013, 9:42 AM

## 2013-02-20 DIAGNOSIS — E785 Hyperlipidemia, unspecified: Secondary | ICD-10-CM | POA: Diagnosis not present

## 2013-02-20 DIAGNOSIS — I259 Chronic ischemic heart disease, unspecified: Secondary | ICD-10-CM | POA: Diagnosis not present

## 2013-02-20 DIAGNOSIS — I509 Heart failure, unspecified: Secondary | ICD-10-CM | POA: Diagnosis not present

## 2013-02-20 DIAGNOSIS — I10 Essential (primary) hypertension: Secondary | ICD-10-CM | POA: Diagnosis not present

## 2013-02-20 NOTE — Discharge Summary (Signed)
Physician Discharge Summary  Patient ID: Terry Wu MRN: [id number] DOB/AGE: [birth year] 79 y.o. Primary Care Physician:Mackenzi Krogh L, MD Admit date: 02/10/2013 Discharge date: 02/20/2013    Discharge Diagnoses:   Principal Problem:   CAP (community acquired pneumonia) Active Problems:   AODM   HYPERTENSION, BENIGN   AORTIC VALVE REPLACEMENT, HX OF   CORONARY ARTERY BYPASS GRAFT, HX OF   Hyponatremia   COPD (chronic obstructive pulmonary disease)   Bilateral leg edema     Medication List    STOP taking these medications       triamterene-hydrochlorothiazide 37.5-25 MG per tablet  Commonly known as:  MAXZIDE-25      TAKE these medications       acetaminophen 500 MG tablet  Commonly known as:  TYLENOL  Take 500 mg by mouth every 6 (six) hours as needed.     ALEVE 220 MG tablet  Generic drug:  naproxen sodium  Take 220 mg by mouth 2 (two) times daily with a meal.     amLODipine 10 MG tablet  Commonly known as:  NORVASC  Take 10 mg by mouth daily.     amoxicillin 500 MG capsule  Commonly known as:  AMOXIL  Take 2,000 mg by mouth as needed (prior to dental appt.).     azithromycin 250 MG tablet  Commonly known as:  ZITHROMAX Z-PAK  Take 2 tablets (500 mg) on  Day 1,  followed by 1 tablet (250 mg) once daily on Days 2 through 5.     benzonatate 100 MG capsule  Commonly known as:  TESSALON  Take 200 mg by mouth 3 (three) times daily as needed for cough.     carvedilol 12.5 MG tablet  Commonly known as:  COREG  Take 1 tablet (12.5 mg total) by mouth 2 (two) times daily with a meal.     clopidogrel 75 MG tablet  Commonly known as:  PLAVIX  Take 75 mg by mouth daily.     Co Q 10 100 MG Caps  Take 1 capsule by mouth every morning.     glimepiride 2 MG tablet  Commonly known as:  AMARYL  Take 1 tablet (2 mg total) by mouth daily before breakfast.     ICAPS PO  Take 2 tablets by mouth 3 (three) times daily after meals.     latanoprost 0.005 %  ophthalmic solution  Commonly known as:  XALATAN  Place 1 drop into both eyes at bedtime.     pantoprazole 40 MG tablet  Commonly known as:  PROTONIX  Take 40 mg by mouth every evening.     potassium chloride SA 20 MEQ tablet  Commonly known as:  K-DUR,KLOR-CON  Take 20 mEq by mouth 2 (two) times daily.     telmisartan 40 MG tablet  Commonly known as:  MICARDIS  Take 1 tablet (40 mg total) by mouth 2 (two) times daily.        Discharged Condition: Improved    Consults: Cardiology  Significant Diagnostic Studies: Dg Chest 2 View  02/09/2013   *RADIOLOGY REPORT*  Clinical Data: Cough and wheezing  CHEST - 2 VIEW  Comparison: 09/19/2007  Findings: Previous median sternotomy and CABG procedure.  There is no pleural effusion identified.  Asymmetric elevation of the left hemidiaphragm is again noted.  Opacity within the left midlung and left base is noted which may represent atelectasis or infiltrate.  IMPRESSION:  1.  Left midlung and left base opacities which may represent atelectasis  and/or infiltrates.   Original Report Authenticated By: Signa Kell, M.D.   Dg Chest Port 1 View  02/11/2013   *RADIOLOGY REPORT*  Clinical Data: Pneumonia  PORTABLE CHEST - 1 VIEW  Comparison: Portable exam 0620 hours compared to 02/09/2013  Findings: Minimal enlargement of cardiac silhouette post CABG. Calcified thoracic aorta. Mild persistent atelectasis or infiltrate at left base. Remaining lungs clear. No gross pleural effusion or pneumothorax.  IMPRESSION: Mild persistent atelectasis versus infiltrate at left base.   Original Report Authenticated By: Ulyses Southward, M.D.    Lab Results: Basic Metabolic Panel: No results found for this basename: NA, K, CL, CO2, GLUCOSE, BUN, CREATININE, CALCIUM, MG, PHOS,  in the last 72 hours Liver Function Tests: No results found for this basename: AST, ALT, ALKPHOS, BILITOT, PROT, ALBUMIN,  in the last 72 hours   CBC: No results found for this basename: WBC,  NEUTROABS, HGB, HCT, MCV, PLT,  in the last 72 hours  No results found for this or any previous visit (from the past 240 hour(s)).   Hospital Course: He was admitted from my office after failing outpatient therapy for community-acquired pneumonia. He was found to be markedly hyponatremic on admission. He was treated with IV antibiotics IV fluids oral fluid restriction and because he also had some edema he was given Lasix. He improved steadily throughout his hospitalization. After about 3 days he had significant wheezing and was thought to be having some element of bronchospasm and was started on IV steroids which made a marked difference in his situation and he improved even further. By the time of discharge his sodium was 133 and he was ambulating in the halls without difficulty. He had cardiology consultation it was felt that he might have some diastolic CHF but that his prosthetic heart valve is in good position and functioning normally  Discharge Exam: Blood pressure 155/68, pulse 58, temperature 98.1 F (36.7 C), temperature source Oral, resp. rate 20, height 5\' 9"  (1.753 m), weight 103.7 kg (228 lb 9.9 oz), SpO2 97.00%. He is awake and alert. His chest is pretty clear heart is regular abdomen is soft  Disposition: Home we discussed home health services and he does not want to do that      Discharge Orders   Future Orders Complete By Expires   Discharge patient  As directed    Discharge patient  As directed         Signed: Dredyn Gubbels L Pager [phone]  02/20/2013, 8:32 AM

## 2013-02-21 NOTE — Discharge Summary (Signed)
NAME:  Terry Wu, Terry Wu                ACCOUNT NO.:  [id number]  MEDICAL RECORD NO.:  [id number]  LOCATION:  A303                          FACILITY:  [facility]  PHYSICIAN:  Jaydenn Boccio L. Juanetta Gosling, M.D.DATE OF BIRTH:  1931-09-04  DATE OF ADMISSION:  02/10/2013 DATE OF DISCHARGE:  LH                              DISCHARGE SUMMARY   ADDENDUM  FINAL DISCHARGE DIAGNOSES:  Acute diastolic heart failure.     Melvyn Hommes L. Juanetta Gosling, M.D.     [facility]/MEDQ  D:  02/21/2013  T:  02/21/2013  Job:  [id number]

## 2013-03-02 DIAGNOSIS — H4011X0 Primary open-angle glaucoma, stage unspecified: Secondary | ICD-10-CM | POA: Diagnosis not present

## 2013-03-02 DIAGNOSIS — E119 Type 2 diabetes mellitus without complications: Secondary | ICD-10-CM | POA: Diagnosis not present

## 2013-03-02 DIAGNOSIS — Z961 Presence of intraocular lens: Secondary | ICD-10-CM | POA: Diagnosis not present

## 2013-03-02 DIAGNOSIS — H409 Unspecified glaucoma: Secondary | ICD-10-CM | POA: Diagnosis not present

## 2013-04-06 ENCOUNTER — Ambulatory Visit (INDEPENDENT_AMBULATORY_CARE_PROVIDER_SITE_OTHER): Payer: Medicare Other | Admitting: Cardiovascular Disease

## 2013-04-06 ENCOUNTER — Encounter: Payer: Self-pay | Admitting: Cardiovascular Disease

## 2013-04-06 ENCOUNTER — Ambulatory Visit (HOSPITAL_COMMUNITY)
Admission: RE | Admit: 2013-04-06 | Discharge: 2013-04-06 | Disposition: A | Discharge status: Home / Self Care | Payer: Medicare Other | Source: Ambulatory Visit | Attending: Cardiovascular Disease | Admitting: Cardiovascular Disease

## 2013-04-06 VITALS — BP 160/86 | HR 64 | Wt 236.0 lb

## 2013-04-06 DIAGNOSIS — J984 Other disorders of lung: Secondary | ICD-10-CM | POA: Diagnosis not present

## 2013-04-06 DIAGNOSIS — R5381 Other malaise: Secondary | ICD-10-CM

## 2013-04-06 DIAGNOSIS — M47814 Spondylosis without myelopathy or radiculopathy, thoracic region: Secondary | ICD-10-CM | POA: Insufficient documentation

## 2013-04-06 DIAGNOSIS — I2510 Atherosclerotic heart disease of native coronary artery without angina pectoris: Secondary | ICD-10-CM

## 2013-04-06 DIAGNOSIS — R0602 Shortness of breath: Secondary | ICD-10-CM | POA: Diagnosis not present

## 2013-04-06 DIAGNOSIS — J189 Pneumonia, unspecified organism: Secondary | ICD-10-CM | POA: Diagnosis not present

## 2013-04-06 DIAGNOSIS — R5383 Other fatigue: Secondary | ICD-10-CM

## 2013-04-06 DIAGNOSIS — I517 Cardiomegaly: Secondary | ICD-10-CM | POA: Diagnosis not present

## 2013-04-06 LAB — BASIC METABOLIC PANEL
BUN: 11 mg/dL (ref 6–23)
Chloride: 103 mEq/L (ref 96–112)
Creatinine, Ser: 0.8 mg/dL (ref 0.4–1.5)
GFR: 99.05 mL/min (ref >60.00)
Glucose, Bld: 97 mg/dL (ref 70–99)

## 2013-04-06 LAB — BRAIN NATRIURETIC PEPTIDE: Pro B Natriuretic peptide (BNP): 171 pg/mL — ABNORMAL HIGH (ref 0.0–100.0)

## 2013-04-06 LAB — CORTISOL: Cortisol, Plasma: 12 ug/dL

## 2013-04-06 NOTE — Assessment & Plan Note (Signed)
Well controlled.  Continue current medications and low sodium Dash type diet.    

## 2013-04-06 NOTE — Assessment & Plan Note (Signed)
Cholesterol is at goal.  Continue current dose of statin and diet Rx.  No myalgias or side effects.  F/U  LFT's in 6 months. No results found for this basename: LDLCALC  Labs with DR Juanetta Gosling

## 2013-04-06 NOTE — Patient Instructions (Signed)
Your physician recommends that you schedule a follow-up appointment in: 3 MONTHS WITH DR [facility] Your physician recommends that you continue on your current medications as directed. Please refer to the Current Medication list given to you today.  Your physician recommends that you return for lab work in: TODAY  BMET BNP  TSH  FREE T4  SED RATE  CORTISOL  A chest x-ray takes a picture of the organs and structures inside the chest, including the heart, lungs, and blood vessels. This test can show several things, including, whether the heart is enlarges; whether fluid is building up in the lungs; and whether pacemaker / defibrillator leads are still in place.  [place]

## 2013-04-06 NOTE — Assessment & Plan Note (Signed)
Recheck BMET  May need diuretic Consider aldactone instead.  F/U Dr Juanetta Gosling

## 2013-04-06 NOTE — Assessment & Plan Note (Signed)
Recent flair complicated by low sodium Need for roids and antibiotics.  Abnormal exam still f/u CXR at [facility] for TSH, ESR and BNP given ongoing fatigue , malaise and dyspnea

## 2013-04-06 NOTE — Progress Notes (Signed)
Patient ID: Terry Wu, male   DOB: September 17, 1931, 79 y.o.   MRN: [id number] Jarmal has a history of coronary bypass surgery with tissue aortic valve replacement 2006. His been doing well. F/U echo 10/2008 showed normal LV funtion and AVR with no perivalvular leak. [name] He is complaining of some fatigue and shortness of breath. He continues to have issues with dizzyness. I reviewed his cardiac rehab numbers and HR and BP are fine. He is only doing 3.9 mets and activity limited by knee pain. His dizzyness has a postural componant on occasion but his BP readings from home and rehab are fine and he has never been postural in the office. We discussed f/u with Dr Shiela Mayer and stopping his uroxatrol. He is on this for frequency He is a nonsmoker and has no history of lung disease. Denies any significant chest pain PND or orthopnea. Has been no previous heart failure. Diabetes well controlled A1c 5.8  05/03/11 normal nuclear study EF 70%  No further SSCP Only fatigue Golfing and going to rehab 3x/week. Enjoys watching grandson play baseball.   Recent hospitalization at [facility] for pneumonia low sodium and ? Diastolic dysfunction  BNP only 486 on 02/10/13   Triam/HCTZ was stopped   Still does not feel well "I give out"  Feels his legs may be retaining fluid again.  Still With mild cough  ROS: Denies fever, malais, weight loss, blurry vision, decreased visual acuity, cough, sputum, SOB, hemoptysis, pleuritic pain, palpitaitons, heartburn, abdominal pain, melena, lower extremity edema, claudication, or rash.  All other systems reviewed and negative  General: Affect appropriate Obese white male  HEENT: normal Neck supple with no adenopathy JVP normal no bruits no thyromegaly Lungs inspitory crackles  no wheezing and good diaphragmatic motion Heart:  S1/S2 no murmur, no rub, gallop or click PMI normal Abdomen: benighn, BS positve, no tenderness, no AAA no bruit.  No HSM or HJR Distal pulses intact with no bruits Trace  bilaterlal  edema Neuro non-focal Skin warm and dry No muscular weakness   Current Outpatient Prescriptions  Medication Sig Dispense Refill  . acetaminophen (TYLENOL) 500 MG tablet Take 500 mg by mouth every 6 (six) hours as needed.      [name] amLODipine (NORVASC) 10 MG tablet Take 10 mg by mouth daily.      [name] amoxicillin (AMOXIL) 500 MG capsule Take 2,000 mg by mouth as needed (prior to dental appt.).      [name] benzonatate (TESSALON) 100 MG capsule Take 200 mg by mouth 3 (three) times daily as needed for cough.      . carvedilol (COREG) 12.5 MG tablet Take 1 tablet (12.5 mg total) by mouth 2 (two) times daily with a meal.  60 tablet  12  . clopidogrel (PLAVIX) 75 MG tablet Take 75 mg by mouth daily.      . Coenzyme Q10 (CO Q 10) 100 MG CAPS Take 1 capsule by mouth every morning.      [name] glimepiride (AMARYL) 2 MG tablet Take 1 tablet (2 mg total) by mouth daily before breakfast.  60 tablet  12  . latanoprost (XALATAN) 0.005 % ophthalmic solution Place 1 drop into both eyes at bedtime.      . Multiple Vitamins-Minerals (ICAPS PO) Take 2 tablets by mouth 3 (three) times daily after meals.       . naproxen sodium (ALEVE) 220 MG tablet Take 220 mg by mouth 2 (two) times daily with a meal.      .  pantoprazole (PROTONIX) 40 MG tablet Take 40 mg by mouth every evening.      . potassium chloride SA (K-DUR,KLOR-CON) 20 MEQ tablet Take 20 mEq by mouth 2 (two) times daily.      [name] telmisartan (MICARDIS) 40 MG tablet Take 1 tablet (40 mg total) by mouth 2 (two) times daily.  30 tablet  6   No current facility-administered medications for this visit.    Allergies  Aspirin and Demerol  Electrocardiogram:  03/28/12  SR rate 58  PR 252  Nonspecific lateral T wave changes  Today NSR rate 64 nonspecific ST/T wave changes no change  Assessment and Plan

## 2013-04-06 NOTE — Assessment & Plan Note (Signed)
Stable with no angina and good activity level.  Continue medical Rx  

## 2013-04-07 DIAGNOSIS — I259 Chronic ischemic heart disease, unspecified: Secondary | ICD-10-CM | POA: Diagnosis not present

## 2013-04-07 DIAGNOSIS — I10 Essential (primary) hypertension: Secondary | ICD-10-CM | POA: Diagnosis not present

## 2013-04-07 DIAGNOSIS — E109 Type 1 diabetes mellitus without complications: Secondary | ICD-10-CM | POA: Diagnosis not present

## 2013-04-07 DIAGNOSIS — Z23 Encounter for immunization: Secondary | ICD-10-CM | POA: Diagnosis not present

## 2013-04-24 ENCOUNTER — Other Ambulatory Visit: Payer: Self-pay | Admitting: Cardiovascular Disease

## 2013-04-30 ENCOUNTER — Other Ambulatory Visit: Payer: Self-pay | Admitting: Cardiovascular Disease

## 2013-05-04 ENCOUNTER — Other Ambulatory Visit: Payer: Self-pay

## 2013-05-04 ENCOUNTER — Ambulatory Visit: Payer: Medicare Other | Admitting: Cardiovascular Disease

## 2013-05-04 MED ORDER — ROSUVASTATIN CALCIUM 5 MG PO TABS: 5.0000 mg | ORAL_TABLET | Freq: Every day | ORAL | Status: DC

## 2013-05-04 NOTE — Telephone Encounter (Signed)
Message copied by Carmela Hurt on Mon May 04, 2013  6:10 PM ------      Message from: Wendall Stade      Created: Mon May 04, 2013  5:18 PM      Regarding: RE: crestor not on med list       Yes you can reorder it            ----- Message -----         From: Carmela Hurt, RN         Sent: 05/04/2013   5:05 PM           To: Wendall Stade, MD, Carmela Hurt, RN      Subject: crestor not on med list                                  Dr Eden Emms,       Crestor not on patient list but pharmacy asking for refill, I think it dropped off when patient in hospital, your note states continue statin therapy, can I reorder crestor?      Thanks, Addison Lank, RN Patient Care Advocate                  "Continue current dose of statin and diet Rx." 04/06/2013       ------

## 2013-05-05 ENCOUNTER — Other Ambulatory Visit: Payer: Self-pay

## 2013-05-05 MED ORDER — ROSUVASTATIN CALCIUM 5 MG PO TABS: ORAL_TABLET | ORAL | Status: DC

## 2013-05-05 MED ORDER — ROSUVASTATIN CALCIUM 5 MG PO TABS: 5.0000 mg | ORAL_TABLET | Freq: Every day | ORAL | Status: DC

## 2013-05-14 ENCOUNTER — Ambulatory Visit: Payer: Medicare Other | Admitting: Cardiovascular Disease

## 2013-05-27 ENCOUNTER — Other Ambulatory Visit: Payer: Self-pay

## 2013-05-27 MED ORDER — AMLODIPINE BESYLATE 10 MG PO TABS: 10.0000 mg | ORAL_TABLET | Freq: Every day | ORAL | Status: DC

## 2013-06-01 DIAGNOSIS — H4011X0 Primary open-angle glaucoma, stage unspecified: Secondary | ICD-10-CM | POA: Diagnosis not present

## 2013-06-01 DIAGNOSIS — H409 Unspecified glaucoma: Secondary | ICD-10-CM | POA: Diagnosis not present

## 2013-06-01 DIAGNOSIS — Z961 Presence of intraocular lens: Secondary | ICD-10-CM | POA: Diagnosis not present

## 2013-06-02 DIAGNOSIS — M7020 Olecranon bursitis, unspecified elbow: Secondary | ICD-10-CM | POA: Diagnosis not present

## 2013-06-02 DIAGNOSIS — I259 Chronic ischemic heart disease, unspecified: Secondary | ICD-10-CM | POA: Diagnosis not present

## 2013-06-02 DIAGNOSIS — I119 Hypertensive heart disease without heart failure: Secondary | ICD-10-CM | POA: Diagnosis not present

## 2013-06-02 DIAGNOSIS — I509 Heart failure, unspecified: Secondary | ICD-10-CM | POA: Diagnosis not present

## 2013-06-02 DIAGNOSIS — IMO0001 Reserved for inherently not codable concepts without codable children: Secondary | ICD-10-CM | POA: Diagnosis not present

## 2013-07-21 ENCOUNTER — Other Ambulatory Visit: Payer: Self-pay | Admitting: Cardiovascular Disease

## 2013-07-22 ENCOUNTER — Other Ambulatory Visit: Payer: Self-pay | Admitting: Cardiovascular Disease

## 2013-07-27 ENCOUNTER — Other Ambulatory Visit: Payer: Self-pay | Admitting: Cardiovascular Disease

## 2013-08-03 ENCOUNTER — Ambulatory Visit: Payer: Medicare Other | Admitting: Cardiovascular Disease

## 2013-08-05 ENCOUNTER — Ambulatory Visit (INDEPENDENT_AMBULATORY_CARE_PROVIDER_SITE_OTHER): Payer: Medicare Other | Admitting: Cardiovascular Disease

## 2013-08-05 ENCOUNTER — Encounter: Payer: Self-pay | Admitting: Cardiovascular Disease

## 2013-08-05 ENCOUNTER — Ambulatory Visit (INDEPENDENT_AMBULATORY_CARE_PROVIDER_SITE_OTHER)
Admission: RE | Admit: 2013-08-05 | Discharge: 2013-08-05 | Disposition: A | Discharge status: Home / Self Care | Payer: Medicare Other | Source: Ambulatory Visit | Attending: Cardiovascular Disease | Admitting: Cardiovascular Disease

## 2013-08-05 VITALS — BP 128/62 | HR 72 | Ht 69.0 in | Wt 240.8 lb

## 2013-08-05 DIAGNOSIS — I10 Essential (primary) hypertension: Secondary | ICD-10-CM

## 2013-08-05 DIAGNOSIS — E782 Mixed hyperlipidemia: Secondary | ICD-10-CM

## 2013-08-05 DIAGNOSIS — E119 Type 2 diabetes mellitus without complications: Secondary | ICD-10-CM

## 2013-08-05 DIAGNOSIS — R0602 Shortness of breath: Secondary | ICD-10-CM

## 2013-08-05 DIAGNOSIS — I712 Thoracic aortic aneurysm, without rupture, unspecified: Secondary | ICD-10-CM | POA: Diagnosis not present

## 2013-08-05 DIAGNOSIS — Z951 Presence of aortocoronary bypass graft: Secondary | ICD-10-CM | POA: Diagnosis not present

## 2013-08-05 NOTE — Patient Instructions (Signed)
Your physician wants you to follow-up in:   [place] will receive a reminder letter in the mail two months in advance. If you don't receive a letter, please call our office to schedule the follow-up appointment. Your physician recommends that you continue on your current medications as directed. Please refer to the Current Medication list given to you today. WILL SWITCH TO LIPITOR IN [facility]  CHEST  CT  DX   SOB

## 2013-08-05 NOTE — Assessment & Plan Note (Signed)
Needs to stop eating toast and grits for breakfast.  Low carb diet and exercise prescribed

## 2013-08-05 NOTE — Assessment & Plan Note (Signed)
Change to lipitor 20 mg when he runs out of crestor in March

## 2013-08-05 NOTE — Assessment & Plan Note (Signed)
Stable with no angina and good activity level.  Continue medical Rx  

## 2013-08-05 NOTE — Assessment & Plan Note (Signed)
Well controlled.  Continue current medications and low sodium Dash type diet.    

## 2013-08-05 NOTE — Progress Notes (Signed)
Patient ID: Terry Wu, male   DOB: Apr 14, 1932, 80 y.o.   MRN: [id number] Emileo has a history of coronary bypass surgery with tissue aortic valve replacement 2006. His been doing well. F/U echo 10/2008 showed normal LV funtion and AVR with no perivalvular leak. [name] He is complaining of some fatigue and shortness of breath. He continues to have issues with dizzyness. I reviewed his cardiac rehab numbers and HR and BP are fine. He is only doing 3.9 mets and activity limited by knee pain. His dizzyness has a postural componant on occasion but his BP readings from home and rehab are fine and he has never been postural in the office. We discussed f/u with Dr Elyse Jarvis and stopping his uroxatrol. He is on this for frequency He is a nonsmoker and has no history of lung disease. Denies any significant chest pain PND or orthopnea. Has been no previous heart failure. Diabetes well controlled A1c 5.8  05/03/11 normal nuclear study EF 70%   No further SSCP Only fatigue Golfing and going to rehab 3x/week. Enjoys watching grandson play baseball.   Wants to change to lipitor due to cost of crestor  Still with mild pain in feet      ROS: Denies fever, malais, weight loss, blurry vision, decreased visual acuity, cough, sputum, SOB, hemoptysis, pleuritic pain, palpitaitons, heartburn, abdominal pain, melena, lower extremity edema, claudication, or rash.  All other systems reviewed and negative  General: Affect appropriate Healthy:  appears stated age 48: normal Neck supple with no adenopathy JVP normal no bruits no thyromegaly Lungs clear with no wheezing and good diaphragmatic motion Heart:  S1/S2 no murmur, no rub, gallop or click PMI normal Abdomen: benighn, BS positve, no tenderness, no AAA no bruit.  No HSM or HJR Distal pulses intact with no bruits No edema Neuro non-focal Skin warm and dry No muscular weakness   Current Outpatient Prescriptions  Medication Sig Dispense Refill  . acetaminophen  (TYLENOL) 500 MG tablet Take 500 mg by mouth every 6 (six) hours as needed.      [name] amLODipine (NORVASC) 10 MG tablet Take 1 tablet (10 mg total) by mouth daily.  30 tablet  6  . amoxicillin (AMOXIL) 500 MG capsule Take 2,000 mg by mouth as needed (prior to dental appt.).      [name] carvedilol (COREG) 12.5 MG tablet Take 1 tablet (12.5 mg total) by mouth 2 (two) times daily with a meal.  60 tablet  12  . clopidogrel (PLAVIX) 75 MG tablet TAKE ONE TABLET BY MOUTH ONCE DAILY  30 tablet  0  . Coenzyme Q10 (CO Q 10) 100 MG CAPS Take 1 capsule by mouth every morning.      [name] CRESTOR 5 MG tablet TAKE ONE TABLET BY MOUTH EVERY OTHER DAY  30 tablet  6  . glimepiride (AMARYL) 2 MG tablet Take 1 mg by mouth daily before breakfast.      . latanoprost (XALATAN) 0.005 % ophthalmic solution Place 1 drop into both eyes at bedtime.      . Multiple Vitamins-Minerals (ICAPS PO) Take 2 tablets by mouth 3 (three) times daily after meals.       . pantoprazole (PROTONIX) 40 MG tablet Take 40 mg by mouth every evening.      . potassium chloride SA (K-DUR,KLOR-CON) 20 MEQ tablet Take 20 mEq by mouth 2 (two) times daily.      [name] telmisartan (MICARDIS) 40 MG tablet Take 1 tablet (40 mg total) by mouth 2 (  two) times daily.  30 tablet  6  . triamterene-hydrochlorothiazide (MAXZIDE-25) 37.5-25 MG per tablet Take 1 tablet by mouth daily.       No current facility-administered medications for this visit.    Allergies  Aspirin and Demerol  Electrocardiogram:  NSR first degree nonspecific ST changes   Assessment and Plan

## 2013-08-06 ENCOUNTER — Telehealth: Payer: Self-pay | Admitting: Cardiovascular Disease

## 2013-08-06 NOTE — Telephone Encounter (Signed)
Follow Up ° ° ° °Pt calling to follow up on test results. Please call. °

## 2013-08-06 NOTE — Telephone Encounter (Signed)
PT AWARE OF  CHEST  CT  RESULTS./CY 

## 2013-08-06 NOTE — Telephone Encounter (Signed)
F/u   Please call pt on his cell.

## 2013-08-06 NOTE — Telephone Encounter (Signed)
Follow up    Returning a nurses call for test results

## 2013-08-24 ENCOUNTER — Other Ambulatory Visit: Payer: Self-pay | Admitting: Cardiovascular Disease

## 2013-08-28 DIAGNOSIS — Z8546 Personal history of malignant neoplasm of prostate: Secondary | ICD-10-CM | POA: Diagnosis not present

## 2013-08-28 DIAGNOSIS — R351 Nocturia: Secondary | ICD-10-CM | POA: Diagnosis not present

## 2013-08-28 DIAGNOSIS — R350 Frequency of micturition: Secondary | ICD-10-CM | POA: Diagnosis not present

## 2013-08-31 DIAGNOSIS — H409 Unspecified glaucoma: Secondary | ICD-10-CM | POA: Diagnosis not present

## 2013-08-31 DIAGNOSIS — IMO0001 Reserved for inherently not codable concepts without codable children: Secondary | ICD-10-CM | POA: Diagnosis not present

## 2013-08-31 DIAGNOSIS — D075 Carcinoma in situ of prostate: Secondary | ICD-10-CM | POA: Diagnosis not present

## 2013-08-31 DIAGNOSIS — I10 Essential (primary) hypertension: Secondary | ICD-10-CM | POA: Diagnosis not present

## 2013-08-31 DIAGNOSIS — R059 Cough, unspecified: Secondary | ICD-10-CM | POA: Diagnosis not present

## 2013-08-31 DIAGNOSIS — H4011X0 Primary open-angle glaucoma, stage unspecified: Secondary | ICD-10-CM | POA: Diagnosis not present

## 2013-08-31 DIAGNOSIS — R05 Cough: Secondary | ICD-10-CM | POA: Diagnosis not present

## 2013-08-31 DIAGNOSIS — I259 Chronic ischemic heart disease, unspecified: Secondary | ICD-10-CM | POA: Diagnosis not present

## 2013-09-08 ENCOUNTER — Other Ambulatory Visit (HOSPITAL_COMMUNITY): Payer: Self-pay

## 2013-09-08 DIAGNOSIS — R059 Cough, unspecified: Secondary | ICD-10-CM

## 2013-09-08 DIAGNOSIS — R05 Cough: Secondary | ICD-10-CM

## 2013-09-08 LAB — PULMONARY FUNCTION TEST
DL/VA % pred: 109 %
DL/VA: 4.95 ml/min/mmHg/L
DLCO COR % PRED: 57 %
DLCO UNC: 17.72 ml/min/mmHg
DLCO cor: 17.72 ml/min/mmHg
DLCO unc % pred: 57 %
FEF 25-75 Post: 1.27 L/sec
FEF 25-75 Pre: 1.22 L/sec
FEF2575-%Change-Post: 4 %
FEF2575-%Pred-Post: 66 %
FEF2575-%Pred-Pre: 64 %
FEV1-%Change-Post: 0 %
FEV1-%PRED-POST: 60 %
FEV1-%Pred-Pre: 60 %
FEV1-Post: 1.68 L
FEV1-Pre: 1.67 L
FEV1FVC-%CHANGE-POST: 0 %
FEV1FVC-%Pred-Pre: 103 %
FEV6-%CHANGE-POST: 1 %
FEV6-%Pred-Post: 62 %
FEV6-%Pred-Pre: 61 %
FEV6-PRE: 2.23 L
FEV6-Post: 2.26 L
FEV6FVC-%Change-Post: 0 %
FEV6FVC-%PRED-POST: 107 %
FEV6FVC-%Pred-Pre: 107 %
FVC-%Change-Post: 1 %
FVC-%PRED-POST: 58 %
FVC-%Pred-Pre: 57 %
FVC-PRE: 2.23 L
FVC-Post: 2.26 L
POST FEV6/FVC RATIO: 100 %
PRE FEV6/FVC RATIO: 100 %
Post FEV1/FVC ratio: 74 %
Pre FEV1/FVC ratio: 75 %

## 2013-09-15 ENCOUNTER — Telehealth: Payer: Self-pay | Admitting: Cardiovascular Disease

## 2013-09-15 MED ORDER — ATORVASTATIN CALCIUM 20 MG PO TABS: 20.0000 mg | ORAL_TABLET | Freq: Every day | ORAL | Status: DC

## 2013-09-15 NOTE — Telephone Encounter (Signed)
Spoke with patient--will send prescription for lipitor 20mg  daily (see Dr Kyla Balzarine 2/11/5 note)  instead of crestor to [facility] for patient.

## 2013-09-15 NOTE — Telephone Encounter (Signed)
New message     Talk to the nurse----Dr Nishan want to change a medication from crestor to generic lipitor--he want to talk to the nurse first

## 2013-09-15 NOTE — Telephone Encounter (Signed)
No answer

## 2013-10-05 ENCOUNTER — Ambulatory Visit (HOSPITAL_COMMUNITY)
Admission: RE | Admit: 2013-10-05 | Discharge: 2013-10-05 | Disposition: A | Discharge status: Home / Self Care | Payer: Medicare Other | Source: Ambulatory Visit | Attending: Pulmonary Disease | Admitting: Pulmonary Disease

## 2013-10-05 DIAGNOSIS — R05 Cough: Secondary | ICD-10-CM | POA: Diagnosis not present

## 2013-10-05 DIAGNOSIS — R0602 Shortness of breath: Secondary | ICD-10-CM | POA: Insufficient documentation

## 2013-10-05 DIAGNOSIS — R059 Cough, unspecified: Secondary | ICD-10-CM | POA: Diagnosis not present

## 2013-10-05 MED ORDER — ALBUTEROL SULFATE (2.5 MG/3ML) 0.083% IN NEBU
2.5000 mg | INHALATION_SOLUTION | Freq: Once | RESPIRATORY_TRACT | Status: AC
  Administered 2013-10-05: 2.5 mg via RESPIRATORY_TRACT

## 2013-10-21 ENCOUNTER — Other Ambulatory Visit: Payer: Self-pay | Admitting: Cardiovascular Disease

## 2013-11-02 DIAGNOSIS — R059 Cough, unspecified: Secondary | ICD-10-CM | POA: Diagnosis not present

## 2013-11-02 DIAGNOSIS — I259 Chronic ischemic heart disease, unspecified: Secondary | ICD-10-CM | POA: Diagnosis not present

## 2013-11-02 DIAGNOSIS — R05 Cough: Secondary | ICD-10-CM | POA: Diagnosis not present

## 2013-11-02 DIAGNOSIS — I10 Essential (primary) hypertension: Secondary | ICD-10-CM | POA: Diagnosis not present

## 2013-11-02 DIAGNOSIS — IMO0001 Reserved for inherently not codable concepts without codable children: Secondary | ICD-10-CM | POA: Diagnosis not present

## 2013-11-30 DIAGNOSIS — M1990 Unspecified osteoarthritis, unspecified site: Secondary | ICD-10-CM | POA: Diagnosis not present

## 2013-11-30 DIAGNOSIS — I259 Chronic ischemic heart disease, unspecified: Secondary | ICD-10-CM | POA: Diagnosis not present

## 2013-11-30 DIAGNOSIS — E109 Type 1 diabetes mellitus without complications: Secondary | ICD-10-CM | POA: Diagnosis not present

## 2013-11-30 DIAGNOSIS — J449 Chronic obstructive pulmonary disease, unspecified: Secondary | ICD-10-CM | POA: Diagnosis not present

## 2013-11-30 DIAGNOSIS — H409 Unspecified glaucoma: Secondary | ICD-10-CM | POA: Diagnosis not present

## 2013-11-30 DIAGNOSIS — H4011X0 Primary open-angle glaucoma, stage unspecified: Secondary | ICD-10-CM | POA: Diagnosis not present

## 2013-12-21 ENCOUNTER — Other Ambulatory Visit: Payer: Self-pay | Admitting: Cardiovascular Disease

## 2014-01-06 ENCOUNTER — Telehealth: Payer: Self-pay | Admitting: Cardiovascular Disease

## 2014-01-06 NOTE — Telephone Encounter (Signed)
New message     Pt is due to see Dr [name] in august.  Will he need to go to the lab prior to appt for lab work?

## 2014-01-06 NOTE — Telephone Encounter (Signed)
Can order CBC wiith PLTls BMET A1c and fasting lipids

## 2014-01-06 NOTE — Telephone Encounter (Signed)
Pt calling to ask Altha Harm and Dr [name] if its possible that he get some labs done prior to his 6 month f/u ov. Pt has no complaints at this time.  Pt  feels that he needs blood work done because he hasn't had labs done in awhile.  Pt would like have labs done prior to his scheduled 08/10 appt, so Dr [name] can review them with him at that visit.  Informed pt that Dr [name] and nurse are both out of the office today, but I will route this message to them for further review and recommendation, and Altha Harm will follow-up with him thereafter.  Pt verbalized understanding and agrees with this plan.

## 2014-01-07 NOTE — Telephone Encounter (Signed)
PT  NOTIFIED  LAB ORDER MAILED TO PT .Adonis Housekeeper

## 2014-01-20 ENCOUNTER — Other Ambulatory Visit: Payer: Self-pay | Admitting: Cardiovascular Disease

## 2014-01-20 ENCOUNTER — Encounter: Payer: Self-pay | Admitting: Cardiovascular Disease

## 2014-01-20 DIAGNOSIS — E119 Type 2 diabetes mellitus without complications: Secondary | ICD-10-CM | POA: Diagnosis not present

## 2014-01-20 DIAGNOSIS — Z5181 Encounter for therapeutic drug level monitoring: Secondary | ICD-10-CM | POA: Diagnosis not present

## 2014-01-20 DIAGNOSIS — Z79899 Other long term (current) drug therapy: Secondary | ICD-10-CM | POA: Diagnosis not present

## 2014-01-20 DIAGNOSIS — I10 Essential (primary) hypertension: Secondary | ICD-10-CM | POA: Diagnosis not present

## 2014-01-20 DIAGNOSIS — E785 Hyperlipidemia, unspecified: Secondary | ICD-10-CM | POA: Diagnosis not present

## 2014-01-25 ENCOUNTER — Other Ambulatory Visit: Payer: Self-pay | Admitting: *Deleted

## 2014-01-25 DIAGNOSIS — I7789 Other specified disorders of arteries and arterioles: Secondary | ICD-10-CM

## 2014-02-01 ENCOUNTER — Ambulatory Visit (INDEPENDENT_AMBULATORY_CARE_PROVIDER_SITE_OTHER): Payer: Medicare Other | Admitting: Cardiovascular Disease

## 2014-02-01 ENCOUNTER — Encounter: Payer: Self-pay | Admitting: Cardiovascular Disease

## 2014-02-01 ENCOUNTER — Ambulatory Visit (INDEPENDENT_AMBULATORY_CARE_PROVIDER_SITE_OTHER)
Admission: RE | Admit: 2014-02-01 | Discharge: 2014-02-01 | Disposition: A | Discharge status: Home / Self Care | Payer: Medicare Other | Source: Ambulatory Visit | Attending: Cardiovascular Disease | Admitting: Cardiovascular Disease

## 2014-02-01 VITALS — BP 145/70 | HR 56 | Ht 69.0 in | Wt 238.1 lb

## 2014-02-01 DIAGNOSIS — I7789 Other specified disorders of arteries and arterioles: Secondary | ICD-10-CM

## 2014-02-01 DIAGNOSIS — Z951 Presence of aortocoronary bypass graft: Secondary | ICD-10-CM | POA: Diagnosis not present

## 2014-02-01 DIAGNOSIS — I719 Aortic aneurysm of unspecified site, without rupture: Secondary | ICD-10-CM | POA: Insufficient documentation

## 2014-02-01 DIAGNOSIS — Z954 Presence of other heart-valve replacement: Secondary | ICD-10-CM

## 2014-02-01 DIAGNOSIS — I713 Abdominal aortic aneurysm, ruptured, unspecified: Secondary | ICD-10-CM | POA: Diagnosis not present

## 2014-02-01 DIAGNOSIS — I10 Essential (primary) hypertension: Secondary | ICD-10-CM | POA: Diagnosis not present

## 2014-02-01 DIAGNOSIS — E782 Mixed hyperlipidemia: Secondary | ICD-10-CM

## 2014-02-01 MED ORDER — IOHEXOL 350 MG/ML SOLN
100.0000 mL | Freq: Once | INTRAVENOUS | Status: AC | PRN
  Administered 2014-02-01: 100 mL via INTRAVENOUS

## 2014-02-01 NOTE — Assessment & Plan Note (Signed)
Well controlled.  Continue current medications and low sodium Dash type diet.    

## 2014-02-01 NOTE — Assessment & Plan Note (Signed)
Reviewed CT from today Stable 4.9 cm  Would be huge surgery to redo AVR/Root/Grafts  F/U CT 1 year

## 2014-02-01 NOTE — Assessment & Plan Note (Signed)
Cholesterol is at goal.  Continue current dose of statin and diet Rx.  No myalgias or side effects.  F/U  LFT's in 6 months. No results found for this basename: LDLCALC  Labs with primary            

## 2014-02-01 NOTE — Progress Notes (Signed)
Patient ID: Terry Wu, male   DOB: 1932/01/24, 80 y.o.   MRN: [id number] Terry Wu has a history of coronary bypass surgery with tissue aortic valve replacement 2006. His been doing well. F/U echo 10/2008 showed normal LV funtion and AVR with no perivalvular leak. [name] He is complaining of some fatigue and shortness of breath. He continues to have issues with dizzyness. I reviewed his cardiac rehab numbers and HR and BP are fine. He is only doing 3.9 mets and activity limited by knee pain. His dizzyness has a postural componant on occasion but his BP readings from home and rehab are fine and he has never been postural in the office. We discussed f/u with Dr Elyse Jarvis and stopping his uroxatrol. He is on this for frequency He is a nonsmoker and has no history of lung disease. Denies any significant chest pain PND or orthopnea. Has been no previous heart failure. Diabetes well controlled A1c 5.8  05/03/11 normal nuclear study EF 70%  No further SSCP Only fatigue Golfing and going to rehab 3x/week. Enjoys watching grandson play baseball.  Wants to change to lipitor due to cost of crestor   Doing well  Summer heat has kept him less active    IMPRESSION:  1. Stable ascending thoracic aortic aneurysm, maximum diameter 4.9  cm.  2. Coronary artery bypass grafts appear grossly patent.  3. Mild cardiomegaly.  4. Ancillary findings include hepatic steatosis, cholelithiasis,  degenerative glenohumeral arthropathy, lower thoracic spondylosis,  and left basilar scarring and mild atelectasis.      ROS: Denies fever, malais, weight loss, blurry vision, decreased visual acuity, cough, sputum, SOB, hemoptysis, pleuritic pain, palpitaitons, heartburn, abdominal pain, melena, lower extremity edema, claudication, or rash.  All other systems reviewed and negative  General: Affect appropriate Overweight white male  HEENT: normal Neck supple with no adenopathy JVP normal no bruits no thyromegaly Lungs clear with no  wheezing and good diaphragmatic motion Heart:  S1/S2 prominent SEM through AVR , no rub, gallop or click PMI normal Abdomen: benighn, BS positve, no tenderness, no AAA no bruit.  No HSM or HJR Distal pulses intact with no bruits No edema Neuro non-focal Skin warm and dry No muscular weakness  S/P bilateral TKR    Current Outpatient Prescriptions  Medication Sig Dispense Refill  . amLODipine (NORVASC) 10 MG tablet TAKE ONE TABLET BY MOUTH ONCE DAILY  30 tablet  1  . amoxicillin (AMOXIL) 500 MG capsule Take 2,000 mg by mouth as needed (prior to dental appt.).      [name] atorvastatin (LIPITOR) 20 MG tablet Take 1 tablet (20 mg total) by mouth daily.  30 tablet  4  . carvedilol (COREG) 12.5 MG tablet TAKE ONE TABLET BY MOUTH TWICE DAILY WITH  A  MEAL  60 tablet  1  . clopidogrel (PLAVIX) 75 MG tablet TAKE ONE TABLET BY MOUTH ONCE DAILY  30 tablet  1  . Coenzyme Q10 (CO Q 10) 100 MG CAPS Take 1 capsule by mouth every morning.      [name] glimepiride (AMARYL) 2 MG tablet Take 1 mg by mouth daily before breakfast.      . latanoprost (XALATAN) 0.005 % ophthalmic solution Place 1 drop into both eyes at bedtime.      . Multiple Vitamins-Minerals (ICAPS PO) Take 2 tablets by mouth 3 (three) times daily after meals.       . Naproxen Sodium (ALEVE) 220 MG CAPS Take 220 mg by mouth 2 (two) times daily.      [name]  pantoprazole (PROTONIX) 40 MG tablet TAKE ONE TABLET BY MOUTH ONCE DAILY  30 tablet  0  . potassium chloride SA (K-DUR,KLOR-CON) 20 MEQ tablet Take 20 mEq by mouth 2 (two) times daily.      [name] telmisartan (MICARDIS) 40 MG tablet Take 40 mg by mouth daily.      [name] triamterene-hydrochlorothiazide (MAXZIDE-25) 37.5-25 MG per tablet Take 1 tablet by mouth daily.       No current facility-administered medications for this visit.    Allergies  Aspirin and Demerol  Electrocardiogram:  SR first degree nonspecific ST changes read as junctional   Assessment and Plan

## 2014-02-01 NOTE — Assessment & Plan Note (Signed)
Normal gradients by recent echo SBE

## 2014-02-01 NOTE — Patient Instructions (Signed)
Your physician wants you to follow-up in:  6 MONTHS WITH DR NISHAN  You will receive a reminder letter in the mail two months in advance. If you don't receive a letter, please call our office to schedule the follow-up appointment. Your physician recommends that you continue on your current medications as directed. Please refer to the Current Medication list given to you today. 

## 2014-02-01 NOTE — Assessment & Plan Note (Signed)
Stable with no angina and good activity level.  Continue medical Rx  

## 2014-02-08 ENCOUNTER — Other Ambulatory Visit: Payer: Self-pay | Admitting: Cardiovascular Disease

## 2014-02-18 ENCOUNTER — Other Ambulatory Visit: Payer: Self-pay | Admitting: Cardiovascular Disease

## 2014-03-02 DIAGNOSIS — E109 Type 1 diabetes mellitus without complications: Secondary | ICD-10-CM | POA: Diagnosis not present

## 2014-03-02 DIAGNOSIS — H409 Unspecified glaucoma: Secondary | ICD-10-CM | POA: Diagnosis not present

## 2014-03-02 DIAGNOSIS — J449 Chronic obstructive pulmonary disease, unspecified: Secondary | ICD-10-CM | POA: Diagnosis not present

## 2014-03-02 DIAGNOSIS — Z961 Presence of intraocular lens: Secondary | ICD-10-CM | POA: Diagnosis not present

## 2014-03-02 DIAGNOSIS — I259 Chronic ischemic heart disease, unspecified: Secondary | ICD-10-CM | POA: Diagnosis not present

## 2014-03-02 DIAGNOSIS — H4011X0 Primary open-angle glaucoma, stage unspecified: Secondary | ICD-10-CM | POA: Diagnosis not present

## 2014-03-02 DIAGNOSIS — Z23 Encounter for immunization: Secondary | ICD-10-CM | POA: Diagnosis not present

## 2014-03-02 DIAGNOSIS — M1990 Unspecified osteoarthritis, unspecified site: Secondary | ICD-10-CM | POA: Diagnosis not present

## 2014-03-22 DIAGNOSIS — M62838 Other muscle spasm: Secondary | ICD-10-CM | POA: Diagnosis not present

## 2014-03-22 DIAGNOSIS — I259 Chronic ischemic heart disease, unspecified: Secondary | ICD-10-CM | POA: Diagnosis not present

## 2014-03-22 DIAGNOSIS — R609 Edema, unspecified: Secondary | ICD-10-CM | POA: Diagnosis not present

## 2014-04-23 DIAGNOSIS — J449 Chronic obstructive pulmonary disease, unspecified: Secondary | ICD-10-CM | POA: Diagnosis not present

## 2014-04-23 DIAGNOSIS — J209 Acute bronchitis, unspecified: Secondary | ICD-10-CM | POA: Diagnosis not present

## 2014-04-23 DIAGNOSIS — E119 Type 2 diabetes mellitus without complications: Secondary | ICD-10-CM | POA: Diagnosis not present

## 2014-04-26 DIAGNOSIS — J209 Acute bronchitis, unspecified: Secondary | ICD-10-CM | POA: Diagnosis not present

## 2014-04-26 DIAGNOSIS — E119 Type 2 diabetes mellitus without complications: Secondary | ICD-10-CM | POA: Diagnosis not present

## 2014-06-01 DIAGNOSIS — H4011X1 Primary open-angle glaucoma, mild stage: Secondary | ICD-10-CM | POA: Diagnosis not present

## 2014-06-04 DIAGNOSIS — R531 Weakness: Secondary | ICD-10-CM | POA: Diagnosis not present

## 2014-06-04 DIAGNOSIS — I110 Hypertensive heart disease with heart failure: Secondary | ICD-10-CM | POA: Diagnosis not present

## 2014-06-04 DIAGNOSIS — E119 Type 2 diabetes mellitus without complications: Secondary | ICD-10-CM | POA: Diagnosis not present

## 2014-06-04 DIAGNOSIS — I10 Essential (primary) hypertension: Secondary | ICD-10-CM | POA: Diagnosis not present

## 2014-06-04 DIAGNOSIS — M159 Polyosteoarthritis, unspecified: Secondary | ICD-10-CM | POA: Diagnosis not present

## 2014-07-21 ENCOUNTER — Telehealth: Payer: Self-pay | Admitting: Cardiovascular Disease

## 2014-07-21 NOTE — Telephone Encounter (Signed)
New message     Pt has an appt on 08-03-14.  Will he need blood work?  If yes, please mail an order to pt and he will get it and have it done in [place].  If no labs are needed, please call pt so he will know

## 2014-07-21 NOTE — Telephone Encounter (Signed)
REVIEWED PT'S  CHART APPEARS  MAY BE  DUE  FOR  LIPID LIVER   ORDER  WRITTEN AND  MAILED TO  PT  . PT  NOTIFIED .Terry Wu

## 2014-07-27 ENCOUNTER — Encounter: Payer: Self-pay | Admitting: Cardiovascular Disease

## 2014-07-27 DIAGNOSIS — E785 Hyperlipidemia, unspecified: Secondary | ICD-10-CM | POA: Diagnosis not present

## 2014-07-27 DIAGNOSIS — Z5181 Encounter for therapeutic drug level monitoring: Secondary | ICD-10-CM | POA: Diagnosis not present

## 2014-07-27 DIAGNOSIS — Z79899 Other long term (current) drug therapy: Secondary | ICD-10-CM | POA: Diagnosis not present

## 2014-08-02 NOTE — Progress Notes (Signed)
Patient ID: URHO RIO, male   DOB: 06-14-1932, 81 y.o.   MRN: [id number] Terry Wu has a history of coronary bypass surgery with tissue aortic valve replacement 2006. His been doing well.[name] He is complaining of some fatigue and shortness of breath. He continues to have issues with dizzyness. I reviewed his cardiac rehab numbers and HR and BP are fine. He is only doing 3.9 mets and activity limited by knee pain. His dizzyness has a postural componant on occasion but his BP readings from home and rehab are fine and he has never been postural in the office. He is a nonsmoker and has no history of lung disease. Denies any significant chest pain PND or orthopnea. Has been no previous heart failure. Diabetes well controlled A1c 5.8   05/03/11 normal nuclear study EF 70%  No further SSCP Only fatigue Golfing and going to rehab 3x/week. Enjoys watching grandson play baseball.   Statin changed to lipitor due to cost of crestor   Echo 8/14 reviewed and AVR ok with normal eF - Left ventricle: The cavity size was normal. There was moderate concentric hypertrophy. Systolic function was normal. The estimated ejection fraction was in the range of 60% to 65%. Although no diagnostic regional wall motion abnormality was identified, this possibility cannot be completely excluded on the basis of this study. Doppler parameters are consistent with abnormal left ventricular relaxation (grade 1 diastolic dysfunction). - Aortic valve: A bioprosthesis was present. No significant regurgitation. Mean gradient: 41mm Hg (S). VTI ratio of LVOT to aortic valve: 0.52. No paravalvuvlar leak. - Aortic root: The aortic root was upper normal in size. - Mitral valve: Calcified annulus. Trivial regurgitation. - Left atrium: The atrium was mildly dilated. - Pulmonary arteries: Systolic pressure could not be accurately estimated. - Inferior vena cava: Not visualized. Unable to estimate CVP. - Pericardium,  extracardiac: There was no pericardial effusion.  CT 8/15   IMPRESSION:  1. Stable ascending thoracic aortic aneurysm, maximum diameter 4.9  cm.  2. Coronary artery bypass grafts appear grossly patent.  3. Mild cardiomegaly.  4. Ancillary findings include hepatic steatosis, cholelithiasis,  degenerative glenohumeral arthropathy, lower thoracic spondylosis,  and left basilar scarring and mild atelectasis.  Some dependant edema Reviewed hom BP readings Good  A1c checked by Hawkins in 6.5 range   ROS: Denies fever, malais, weight loss, blurry vision, decreased visual acuity, cough, sputum, SOB, hemoptysis, pleuritic pain, palpitaitons, heartburn, abdominal pain, melena, lower extremity edema, claudication, or rash.  All other systems reviewed and negative  General: Affect appropriate Overweight white male  HEENT: normal Neck supple with no adenopathy JVP normal no bruits no thyromegaly Lungs clear with no wheezing and good diaphragmatic motion Heart:  S1/S2 prominent SEM through AVR , no rub, gallop or click PMI normal Abdomen: benighn, BS positve, no tenderness, no AAA no bruit.  No HSM or HJR Distal pulses intact with no bruits No edema Neuro non-focal Skin warm and dry No muscular weakness  S/P bilateral TKR    Current Outpatient Prescriptions  Medication Sig Dispense Refill  . amLODipine (NORVASC) 10 MG tablet TAKE ONE TABLET BY MOUTH ONCE DAILY 30 tablet 6  . amoxicillin (AMOXIL) 500 MG capsule Take 2,000 mg by mouth as needed (prior to dental appt.).    [name] atorvastatin (LIPITOR) 20 MG tablet TAKE ONE TABLET BY MOUTH ONCE DAILY 30 tablet 5  . carvedilol (COREG) 12.5 MG tablet TAKE ONE TABLET BY MOUTH TWICE DAILY WITH MEALS 60 tablet 6  . clopidogrel (PLAVIX)  75 MG tablet TAKE ONE TABLET BY MOUTH ONCE DAILY 30 tablet 6  . Coenzyme Q10 (CO Q 10) 100 MG CAPS Take 1 capsule by mouth every morning.    [name] glimepiride (AMARYL) 2 MG tablet Take 1 mg by mouth daily before  breakfast.    . latanoprost (XALATAN) 0.005 % ophthalmic solution Place 1 drop into both eyes at bedtime.    . Multiple Vitamins-Minerals (ICAPS PO) Take 2 tablets by mouth 3 (three) times daily after meals.     . Naproxen Sodium (ALEVE) 220 MG CAPS Take 220 mg by mouth 2 (two) times daily.    . pantoprazole (PROTONIX) 40 MG tablet TAKE ONE TABLET BY MOUTH ONCE DAILY 30 tablet 6  . potassium chloride SA (K-DUR,KLOR-CON) 20 MEQ tablet Take 20 mEq by mouth 2 (two) times daily.    [name] telmisartan (MICARDIS) 40 MG tablet Take 40 mg by mouth daily.    [name] triamterene-hydrochlorothiazide (MAXZIDE-25) 37.5-25 MG per tablet Take 1 tablet by mouth daily.     No current facility-administered medications for this visit.    Allergies  Aspirin and Demerol  Electrocardiogram:  04/06/13  SR first degree nonspecific ST changes read as junctional   Assessment and Plan

## 2014-08-03 ENCOUNTER — Encounter: Payer: Self-pay | Admitting: Cardiovascular Disease

## 2014-08-03 ENCOUNTER — Ambulatory Visit (INDEPENDENT_AMBULATORY_CARE_PROVIDER_SITE_OTHER): Payer: Medicare Other | Admitting: Cardiovascular Disease

## 2014-08-03 VITALS — BP 126/26 | HR 87 | Ht 69.0 in | Wt 245.6 lb

## 2014-08-03 DIAGNOSIS — I10 Essential (primary) hypertension: Secondary | ICD-10-CM | POA: Diagnosis not present

## 2014-08-03 DIAGNOSIS — E782 Mixed hyperlipidemia: Secondary | ICD-10-CM

## 2014-08-03 DIAGNOSIS — I719 Aortic aneurysm of unspecified site, without rupture: Secondary | ICD-10-CM

## 2014-08-03 DIAGNOSIS — Z951 Presence of aortocoronary bypass graft: Secondary | ICD-10-CM | POA: Diagnosis not present

## 2014-08-03 DIAGNOSIS — Z954 Presence of other heart-valve replacement: Secondary | ICD-10-CM | POA: Diagnosis not present

## 2014-08-03 DIAGNOSIS — Z952 Presence of prosthetic heart valve: Secondary | ICD-10-CM

## 2014-08-03 NOTE — Assessment & Plan Note (Signed)
No change in murmur no AR murmur SBE prophylaxis Normal function by echo 2014  Consider f/u echo latter this year

## 2014-08-03 NOTE — Assessment & Plan Note (Signed)
Well controlled.  Continue current medications and low sodium Dash type diet.    

## 2014-08-03 NOTE — Assessment & Plan Note (Signed)
Stable with no angina and good activity level.  Continue medical Rx  

## 2014-08-03 NOTE — Assessment & Plan Note (Signed)
Cholesterol is at goal.  Continue current dose of statin and diet Rx.  No myalgias or side effects.  F/U  LFT's in 6 months. No results found for: [facility]  Labs with Luan Pulling

## 2014-08-03 NOTE — Patient Instructions (Signed)
Your physician wants you to follow-up in:  6 MONTHS WITH DR NISHAN  You will receive a reminder letter in the mail two months in advance. If you don't receive a letter, please call our office to schedule the follow-up appointment. Your physician recommends that you continue on your current medications as directed. Please refer to the Current Medication list given to you today. 

## 2014-08-03 NOTE — Assessment & Plan Note (Signed)
4.9 cm in setting of previous AVR  F/U cardiac CT in August to assess size and graft patency  Gating with 3D data set will give best results and given his age not as concerned about cumulative XRT dose

## 2014-08-11 ENCOUNTER — Other Ambulatory Visit: Payer: Self-pay | Admitting: Cardiovascular Disease

## 2014-08-13 DIAGNOSIS — Z8546 Personal history of malignant neoplasm of prostate: Secondary | ICD-10-CM | POA: Diagnosis not present

## 2014-08-13 DIAGNOSIS — R351 Nocturia: Secondary | ICD-10-CM | POA: Diagnosis not present

## 2014-08-30 DIAGNOSIS — H4011X1 Primary open-angle glaucoma, mild stage: Secondary | ICD-10-CM | POA: Diagnosis not present

## 2014-08-31 DIAGNOSIS — Z8546 Personal history of malignant neoplasm of prostate: Secondary | ICD-10-CM | POA: Diagnosis not present

## 2014-09-06 DIAGNOSIS — E119 Type 2 diabetes mellitus without complications: Secondary | ICD-10-CM | POA: Diagnosis not present

## 2014-09-06 DIAGNOSIS — I2510 Atherosclerotic heart disease of native coronary artery without angina pectoris: Secondary | ICD-10-CM | POA: Diagnosis not present

## 2014-09-06 DIAGNOSIS — M25531 Pain in right wrist: Secondary | ICD-10-CM | POA: Diagnosis not present

## 2014-09-06 DIAGNOSIS — J449 Chronic obstructive pulmonary disease, unspecified: Secondary | ICD-10-CM | POA: Diagnosis not present

## 2014-09-16 ENCOUNTER — Other Ambulatory Visit: Payer: Self-pay | Admitting: Cardiovascular Disease

## 2014-12-07 DIAGNOSIS — C61 Malignant neoplasm of prostate: Secondary | ICD-10-CM | POA: Diagnosis not present

## 2014-12-07 DIAGNOSIS — I110 Hypertensive heart disease with heart failure: Secondary | ICD-10-CM | POA: Diagnosis not present

## 2014-12-07 DIAGNOSIS — R531 Weakness: Secondary | ICD-10-CM | POA: Diagnosis not present

## 2014-12-07 DIAGNOSIS — E119 Type 2 diabetes mellitus without complications: Secondary | ICD-10-CM | POA: Diagnosis not present

## 2014-12-07 DIAGNOSIS — J449 Chronic obstructive pulmonary disease, unspecified: Secondary | ICD-10-CM | POA: Diagnosis not present

## 2014-12-07 DIAGNOSIS — E785 Hyperlipidemia, unspecified: Secondary | ICD-10-CM | POA: Diagnosis not present

## 2014-12-14 DIAGNOSIS — I110 Hypertensive heart disease with heart failure: Secondary | ICD-10-CM | POA: Diagnosis not present

## 2014-12-14 DIAGNOSIS — I2510 Atherosclerotic heart disease of native coronary artery without angina pectoris: Secondary | ICD-10-CM | POA: Diagnosis not present

## 2014-12-14 DIAGNOSIS — J449 Chronic obstructive pulmonary disease, unspecified: Secondary | ICD-10-CM | POA: Diagnosis not present

## 2014-12-14 DIAGNOSIS — E119 Type 2 diabetes mellitus without complications: Secondary | ICD-10-CM | POA: Diagnosis not present

## 2015-01-03 DIAGNOSIS — H4011X1 Primary open-angle glaucoma, mild stage: Secondary | ICD-10-CM | POA: Diagnosis not present

## 2015-02-07 ENCOUNTER — Other Ambulatory Visit: Payer: Self-pay | Admitting: Cardiovascular Disease

## 2015-02-21 DIAGNOSIS — L03115 Cellulitis of right lower limb: Secondary | ICD-10-CM | POA: Diagnosis not present

## 2015-02-21 DIAGNOSIS — R609 Edema, unspecified: Secondary | ICD-10-CM | POA: Diagnosis not present

## 2015-02-21 DIAGNOSIS — E119 Type 2 diabetes mellitus without complications: Secondary | ICD-10-CM | POA: Diagnosis not present

## 2015-02-21 DIAGNOSIS — I110 Hypertensive heart disease with heart failure: Secondary | ICD-10-CM | POA: Diagnosis not present

## 2015-03-07 ENCOUNTER — Other Ambulatory Visit: Payer: Self-pay | Admitting: Cardiovascular Disease

## 2015-03-16 DIAGNOSIS — I10 Essential (primary) hypertension: Secondary | ICD-10-CM | POA: Diagnosis not present

## 2015-03-16 DIAGNOSIS — I2510 Atherosclerotic heart disease of native coronary artery without angina pectoris: Secondary | ICD-10-CM | POA: Diagnosis not present

## 2015-03-16 DIAGNOSIS — J449 Chronic obstructive pulmonary disease, unspecified: Secondary | ICD-10-CM | POA: Diagnosis not present

## 2015-03-16 DIAGNOSIS — Z23 Encounter for immunization: Secondary | ICD-10-CM | POA: Diagnosis not present

## 2015-03-16 DIAGNOSIS — E119 Type 2 diabetes mellitus without complications: Secondary | ICD-10-CM | POA: Diagnosis not present

## 2015-04-01 DIAGNOSIS — J449 Chronic obstructive pulmonary disease, unspecified: Secondary | ICD-10-CM | POA: Diagnosis not present

## 2015-04-01 DIAGNOSIS — I2510 Atherosclerotic heart disease of native coronary artery without angina pectoris: Secondary | ICD-10-CM | POA: Diagnosis not present

## 2015-04-01 DIAGNOSIS — E119 Type 2 diabetes mellitus without complications: Secondary | ICD-10-CM | POA: Diagnosis not present

## 2015-04-01 DIAGNOSIS — I10 Essential (primary) hypertension: Secondary | ICD-10-CM | POA: Diagnosis not present

## 2015-04-04 DIAGNOSIS — Z961 Presence of intraocular lens: Secondary | ICD-10-CM | POA: Diagnosis not present

## 2015-04-04 DIAGNOSIS — H401131 Primary open-angle glaucoma, bilateral, mild stage: Secondary | ICD-10-CM | POA: Diagnosis not present

## 2015-04-04 DIAGNOSIS — E119 Type 2 diabetes mellitus without complications: Secondary | ICD-10-CM | POA: Diagnosis not present

## 2015-04-06 ENCOUNTER — Other Ambulatory Visit: Payer: Self-pay | Admitting: Cardiovascular Disease

## 2015-04-13 ENCOUNTER — Other Ambulatory Visit: Payer: Self-pay | Admitting: Cardiovascular Disease

## 2015-04-20 ENCOUNTER — Other Ambulatory Visit: Payer: Self-pay | Admitting: Cardiovascular Disease

## 2015-05-16 ENCOUNTER — Other Ambulatory Visit: Payer: Self-pay | Admitting: *Deleted

## 2015-05-16 MED ORDER — CLOPIDOGREL BISULFATE 75 MG PO TABS: 75.0000 mg | ORAL_TABLET | Freq: Every day | ORAL | Status: DC

## 2015-05-17 ENCOUNTER — Other Ambulatory Visit: Payer: Self-pay | Admitting: Cardiovascular Disease

## 2015-05-18 ENCOUNTER — Telehealth: Payer: Self-pay | Admitting: Cardiovascular Disease

## 2015-05-18 MED ORDER — CLOPIDOGREL BISULFATE 75 MG PO TABS: 75.0000 mg | ORAL_TABLET | Freq: Every day | ORAL | Status: DC

## 2015-05-18 MED ORDER — PANTOPRAZOLE SODIUM 40 MG PO TBEC: 40.0000 mg | DELAYED_RELEASE_TABLET | Freq: Every day | ORAL | Status: DC

## 2015-05-18 MED ORDER — CARVEDILOL 12.5 MG PO TABS: 12.5000 mg | ORAL_TABLET | Freq: Two times a day (BID) | ORAL | Status: DC

## 2015-05-18 MED ORDER — AMLODIPINE BESYLATE 10 MG PO TABS: 10.0000 mg | ORAL_TABLET | Freq: Every day | ORAL | Status: DC

## 2015-05-18 NOTE — Telephone Encounter (Signed)
Patient is over due for an appointment, so offered PA or NP, patient refused.  Made an appointment for next available with Dr. [name], 07/15/2015 at 9:15. Informed patient to be here at 9:00 am for check in. Sent enough refills for patient to have until next office visit. Patient verbalized understanding.

## 2015-05-18 NOTE — Telephone Encounter (Signed)
New problem    Pt insist that he speak with a nurse concerning his medication refill and why he need an appt. Please call pt b/c he wouldn't give me much information.

## 2015-05-30 ENCOUNTER — Other Ambulatory Visit: Payer: Self-pay | Admitting: Cardiovascular Disease

## 2015-06-08 DIAGNOSIS — I110 Hypertensive heart disease with heart failure: Secondary | ICD-10-CM | POA: Diagnosis not present

## 2015-06-08 DIAGNOSIS — E119 Type 2 diabetes mellitus without complications: Secondary | ICD-10-CM | POA: Diagnosis not present

## 2015-06-08 DIAGNOSIS — J449 Chronic obstructive pulmonary disease, unspecified: Secondary | ICD-10-CM | POA: Diagnosis not present

## 2015-06-08 DIAGNOSIS — I2510 Atherosclerotic heart disease of native coronary artery without angina pectoris: Secondary | ICD-10-CM | POA: Diagnosis not present

## 2015-07-05 DIAGNOSIS — M159 Polyosteoarthritis, unspecified: Secondary | ICD-10-CM | POA: Diagnosis not present

## 2015-07-05 DIAGNOSIS — H401131 Primary open-angle glaucoma, bilateral, mild stage: Secondary | ICD-10-CM | POA: Diagnosis not present

## 2015-07-05 DIAGNOSIS — I110 Hypertensive heart disease with heart failure: Secondary | ICD-10-CM | POA: Diagnosis not present

## 2015-07-05 DIAGNOSIS — E119 Type 2 diabetes mellitus without complications: Secondary | ICD-10-CM | POA: Diagnosis not present

## 2015-07-05 DIAGNOSIS — J449 Chronic obstructive pulmonary disease, unspecified: Secondary | ICD-10-CM | POA: Diagnosis not present

## 2015-07-08 ENCOUNTER — Encounter: Payer: Self-pay | Admitting: *Deleted

## 2015-07-14 NOTE — Progress Notes (Signed)
Patient ID: Terry Wu, male   DOB: 1932-05-03, 82 y.o.   MRN: [id number]   Terry Wu has a history of coronary bypass surgery with tissue aortic valve replacement 2006. His been doing well.[name] He is complaining of some fatigue and shortness of breath. He continues to have issues with dizzyness. I reviewed his cardiac rehab numbers and HR and BP are fine. He is only doing 3.9 mets and activity limited by knee pain. His dizzyness has a postural componant on occasion but his BP readings from home and rehab are fine and he has never been postural in the office. He is a nonsmoker and has no history of lung disease. Denies any significant chest pain PND or orthopnea. Has been no previous heart failure. Diabetes well controlled A1c 5.8   05/03/11 normal nuclear study EF 70%  No further SSCP Only fatigue Golfing and going to rehab 3x/week. Enjoys watching grandson play baseball.   Statin changed to lipitor due to cost of crestor   Echo 8/14 reviewed and AVR ok with normal eF - Left ventricle: The cavity size was normal. There was moderate concentric hypertrophy. Systolic function was normal. The estimated ejection fraction was in the range of 60% to 65%. Although no diagnostic regional wall motion abnormality was identified, this possibility cannot be completely excluded on the basis of this study. Doppler parameters are consistent with abnormal left ventricular relaxation (grade 1 diastolic dysfunction). - Aortic valve: A bioprosthesis was present. No significant regurgitation. Mean gradient: 8mm Hg (S). VTI ratio of LVOT to aortic valve: 0.52. No paravalvuvlar leak. - Aortic root: The aortic root was upper normal in size. - Mitral valve: Calcified annulus. Trivial regurgitation. - Left atrium: The atrium was mildly dilated. - Pulmonary arteries: Systolic pressure could not be accurately estimated. - Inferior vena cava: Not visualized. Unable to estimate CVP. - Pericardium,  extracardiac: There was no pericardial effusion.  CT 8/15   IMPRESSION:  1. Stable ascending thoracic aortic aneurysm, maximum diameter 4.9  cm.  2. Coronary artery bypass grafts appear grossly patent.  3. Mild cardiomegaly.  4. Ancillary findings include hepatic steatosis, cholelithiasis,  degenerative glenohumeral arthropathy, lower thoracic spondylosis,  and left basilar scarring and mild atelectasis.  Some dependant edema Reviewed hom BP readings Good  A1c checked by Hawkins in 6.5 range   ROS: Denies fever, malais, weight loss, blurry vision, decreased visual acuity, cough, sputum, SOB, hemoptysis, pleuritic pain, palpitaitons, heartburn, abdominal pain, melena, lower extremity edema, claudication, or rash.  All other systems reviewed and negative  General: Affect appropriate Overweight white male  HEENT: normal Neck supple with no adenopathy JVP normal no bruits no thyromegaly Lungs clear with no wheezing and good diaphragmatic motion Heart:  S1/S2 prominent SEM through AVR , no rub, gallop or click PMI normal Abdomen: benighn, BS positve, no tenderness, no AAA no bruit.  No HSM or HJR Distal pulses intact with no bruits No edema Neuro non-focal Skin warm and dry No muscular weakness  S/P bilateral TKR    Current Outpatient Prescriptions  Medication Sig Dispense Refill  . amLODipine (NORVASC) 10 MG tablet TAKE ONE TABLET BY MOUTH ONCE DAILY 30 tablet 10  . amoxicillin (AMOXIL) 500 MG capsule Take 2,000 mg by mouth as directed.     [name] atorvastatin (LIPITOR) 20 MG tablet Take 1 tablet (20 mg total) by mouth daily. 90 tablet 3  . carvedilol (COREG) 12.5 MG tablet TAKE ONE TABLET BY MOUTH ONCE DAILY WITH MEALS 90 tablet 3  . clopidogrel (  PLAVIX) 75 MG tablet Take 1 tablet (75 mg total) by mouth daily. 90 tablet 3  . Coenzyme Q10 (CO Q 10) 100 MG CAPS Take 1 capsule by mouth every morning.    [name] glimepiride (AMARYL) 2 MG tablet Take 2 mg by mouth daily before breakfast.      . latanoprost (XALATAN) 0.005 % ophthalmic solution Place 1 drop into both eyes at bedtime.    . magnesium oxide (MAG-OX) 400 MG tablet Take 400 mg by mouth daily.    . Multiple Vitamins-Minerals (ICAPS PO) Take 2 tablets by mouth 3 (three) times daily after meals.     . Naproxen Sodium (ALEVE) 220 MG CAPS Take 220 mg by mouth 2 (two) times daily.    . pantoprazole (PROTONIX) 40 MG tablet Take 1 tablet (40 mg total) by mouth daily. 90 tablet 3  . potassium chloride SA (K-DUR,KLOR-CON) 20 MEQ tablet Take 20 mEq by mouth 2 (two) times daily.    [name] telmisartan (MICARDIS) 40 MG tablet Take 40 mg by mouth daily.    [name] torsemide (DEMADEX) 100 MG tablet Take 100 mg by mouth daily.    [name] triamterene-hydrochlorothiazide (MAXZIDE-25) 37.5-25 MG per tablet Take 1 tablet by mouth daily.     No current facility-administered medications for this visit.    Allergies  Aspirin and Demerol  Electrocardiogram:  04/06/13  SR first degree nonspecific ST changes read as junctional   07/15/15  SR ate 71  Atrial bigemminny    Assessment and Plan AVR: 2006  Mean gradient 10 mmhg by last echo with no AR  SBE  CAD/CABG:  With AVR  Normal nuclear 2012  Continue plavix and beta blocker allergic to ASA Aneurysm:  4.9 cm CT 2015  BMET today and f/u CTA today as they drove in from [place]  DM:  Discussed low carb diet.  Target hemoglobin A1c is 6.5 or less.  Continue current medications. Chol:  Labs with primary on statin    Jenkins Rouge

## 2015-07-15 ENCOUNTER — Ambulatory Visit (INDEPENDENT_AMBULATORY_CARE_PROVIDER_SITE_OTHER)
Admission: RE | Admit: 2015-07-15 | Discharge: 2015-07-15 | Disposition: A | Discharge status: Home / Self Care | Payer: Medicare Other | Source: Ambulatory Visit | Attending: Cardiovascular Disease | Admitting: Cardiovascular Disease

## 2015-07-15 ENCOUNTER — Other Ambulatory Visit (HOSPITAL_COMMUNITY)
Admission: RE | Admit: 2015-07-15 | Discharge: 2015-07-15 | Disposition: A | Discharge status: Home / Self Care | Payer: Medicare Other | Source: Ambulatory Visit | Attending: Cardiovascular Disease | Admitting: Cardiovascular Disease

## 2015-07-15 ENCOUNTER — Encounter: Payer: Self-pay | Admitting: Cardiovascular Disease

## 2015-07-15 ENCOUNTER — Ambulatory Visit (INDEPENDENT_AMBULATORY_CARE_PROVIDER_SITE_OTHER): Payer: Medicare Other | Admitting: Cardiovascular Disease

## 2015-07-15 ENCOUNTER — Telehealth: Payer: Self-pay | Admitting: Cardiovascular Disease

## 2015-07-15 VITALS — BP 143/76 | HR 80 | Ht 69.0 in | Wt 243.8 lb

## 2015-07-15 DIAGNOSIS — I10 Essential (primary) hypertension: Secondary | ICD-10-CM

## 2015-07-15 DIAGNOSIS — Z01812 Encounter for preprocedural laboratory examination: Secondary | ICD-10-CM | POA: Diagnosis not present

## 2015-07-15 DIAGNOSIS — I719 Aortic aneurysm of unspecified site, without rupture: Secondary | ICD-10-CM | POA: Diagnosis not present

## 2015-07-15 DIAGNOSIS — I712 Thoracic aortic aneurysm, without rupture: Secondary | ICD-10-CM | POA: Diagnosis not present

## 2015-07-15 LAB — BASIC METABOLIC PANEL
Anion gap: 9 (ref 5–15)
BUN: 18 mg/dL (ref 6–20)
CALCIUM: 10 mg/dL (ref 8.9–10.3)
CO2: 32 mmol/L (ref 22–32)
CREATININE: 1.05 mg/dL (ref 0.61–1.24)
Chloride: 101 mmol/L (ref 101–111)
GFR calc non Af Amer: >60 mL/min (ref >60)
GLUCOSE: 132 mg/dL — AB (ref 65–99)
Potassium: 4.1 mmol/L (ref 3.5–5.1)
Sodium: 142 mmol/L (ref 135–145)

## 2015-07-15 MED ORDER — IOHEXOL 350 MG/ML SOLN
100.0000 mL | Freq: Once | INTRAVENOUS | Status: AC | PRN
  Administered 2015-07-15: 100 mL via INTRAVENOUS

## 2015-07-15 MED ORDER — CLOPIDOGREL BISULFATE 75 MG PO TABS: 75.0000 mg | ORAL_TABLET | Freq: Every day | ORAL | Status: DC

## 2015-07-15 MED ORDER — ATORVASTATIN CALCIUM 20 MG PO TABS: 20.0000 mg | ORAL_TABLET | Freq: Every day | ORAL | Status: DC

## 2015-07-15 MED ORDER — CARVEDILOL 12.5 MG PO TABS: ORAL_TABLET | ORAL | Status: DC

## 2015-07-15 MED ORDER — PANTOPRAZOLE SODIUM 40 MG PO TBEC: 40.0000 mg | DELAYED_RELEASE_TABLET | Freq: Every day | ORAL | Status: DC

## 2015-07-15 NOTE — Patient Instructions (Addendum)
Medication Instructions:  Your physician recommends that you continue on your current medications as directed. Please refer to the Current Medication list given to you today.  Labwork: Your physician recommends that you have labs today BMET  Testing/Procedures: Non-Cardiac CT Angiography (CTA), is a special type of CT scan that uses a computer to produce multi-dimensional views of major blood vessels throughout the body. In CT angiography, a contrast material is injected through an IV to help visualize the blood vessels. Scheduled for today at 1:00 pm. Please do not eat anything for two hours prior to test. Please arrive at 12:45.  Follow-Up: Your physician wants you to follow-up in: 6 months with Dr. [name]. You will receive a reminder letter in the mail two months in advance. If you don't receive a letter, please call our office to schedule the follow-up appointment.  If you need a refill on your cardiac medications before your next appointment, please call your pharmacy.

## 2015-07-15 NOTE — Telephone Encounter (Signed)
Follow Up ° °Pt returned call//  °

## 2015-07-15 NOTE — Telephone Encounter (Signed)
Called patient back with lab and CT results. Patient verbalized understanding.

## 2015-07-29 ENCOUNTER — Telehealth: Payer: Self-pay | Admitting: Cardiovascular Disease

## 2015-07-29 MED ORDER — CARVEDILOL 12.5 MG PO TABS: 12.5000 mg | ORAL_TABLET | Freq: Two times a day (BID) | ORAL | Status: DC

## 2015-07-29 NOTE — Telephone Encounter (Signed)
Patient's prescription is supposed to be Coreg 12.5 mg by mouth twice daily. Fixed refill with correct dose and frequency. Patient verbalized understanding.

## 2015-07-29 NOTE — Telephone Encounter (Signed)
New message ° ° ° ° ° °Talk to the nurse.  Pt would not tell me what he wanted °

## 2015-08-22 ENCOUNTER — Emergency Department (HOSPITAL_COMMUNITY): Payer: Medicare Other

## 2015-08-22 ENCOUNTER — Inpatient Hospital Stay (HOSPITAL_COMMUNITY)
Admission: EM | Admit: 2015-08-22 | Discharge: 2015-08-25 | DRG: 310 | Disposition: A | Discharge status: Home / Self Care | Payer: Medicare Other | Attending: Pulmonary Disease | Admitting: Pulmonary Disease

## 2015-08-22 ENCOUNTER — Encounter (HOSPITAL_COMMUNITY): Payer: Self-pay | Admitting: Emergency Medicine

## 2015-08-22 DIAGNOSIS — E782 Mixed hyperlipidemia: Secondary | ICD-10-CM | POA: Diagnosis present

## 2015-08-22 DIAGNOSIS — J439 Emphysema, unspecified: Secondary | ICD-10-CM

## 2015-08-22 DIAGNOSIS — R404 Transient alteration of awareness: Secondary | ICD-10-CM | POA: Diagnosis not present

## 2015-08-22 DIAGNOSIS — I2510 Atherosclerotic heart disease of native coronary artery without angina pectoris: Secondary | ICD-10-CM | POA: Diagnosis present

## 2015-08-22 DIAGNOSIS — Z951 Presence of aortocoronary bypass graft: Secondary | ICD-10-CM

## 2015-08-22 DIAGNOSIS — R42 Dizziness and giddiness: Secondary | ICD-10-CM | POA: Diagnosis not present

## 2015-08-22 DIAGNOSIS — I719 Aortic aneurysm of unspecified site, without rupture: Secondary | ICD-10-CM | POA: Diagnosis present

## 2015-08-22 DIAGNOSIS — I4891 Unspecified atrial fibrillation: Principal | ICD-10-CM | POA: Diagnosis present

## 2015-08-22 DIAGNOSIS — R600 Localized edema: Secondary | ICD-10-CM | POA: Diagnosis present

## 2015-08-22 DIAGNOSIS — I712 Thoracic aortic aneurysm, without rupture: Secondary | ICD-10-CM | POA: Diagnosis present

## 2015-08-22 DIAGNOSIS — E669 Obesity, unspecified: Secondary | ICD-10-CM | POA: Diagnosis present

## 2015-08-22 DIAGNOSIS — E119 Type 2 diabetes mellitus without complications: Secondary | ICD-10-CM | POA: Diagnosis present

## 2015-08-22 DIAGNOSIS — I10 Essential (primary) hypertension: Secondary | ICD-10-CM | POA: Diagnosis present

## 2015-08-22 DIAGNOSIS — I482 Chronic atrial fibrillation, unspecified: Secondary | ICD-10-CM

## 2015-08-22 DIAGNOSIS — Z952 Presence of prosthetic heart valve: Secondary | ICD-10-CM

## 2015-08-22 DIAGNOSIS — I272 Other secondary pulmonary hypertension: Secondary | ICD-10-CM | POA: Diagnosis present

## 2015-08-22 DIAGNOSIS — R55 Syncope and collapse: Secondary | ICD-10-CM | POA: Diagnosis not present

## 2015-08-22 DIAGNOSIS — M1990 Unspecified osteoarthritis, unspecified site: Secondary | ICD-10-CM | POA: Diagnosis present

## 2015-08-22 DIAGNOSIS — Z7902 Long term (current) use of antithrombotics/antiplatelets: Secondary | ICD-10-CM

## 2015-08-22 DIAGNOSIS — Z6836 Body mass index (BMI) 36.0-36.9, adult: Secondary | ICD-10-CM

## 2015-08-22 DIAGNOSIS — R269 Unspecified abnormalities of gait and mobility: Secondary | ICD-10-CM | POA: Diagnosis not present

## 2015-08-22 DIAGNOSIS — R2689 Other abnormalities of gait and mobility: Secondary | ICD-10-CM | POA: Diagnosis not present

## 2015-08-22 DIAGNOSIS — J449 Chronic obstructive pulmonary disease, unspecified: Secondary | ICD-10-CM | POA: Diagnosis present

## 2015-08-22 DIAGNOSIS — Z886 Allergy status to analgesic agent status: Secondary | ICD-10-CM

## 2015-08-22 DIAGNOSIS — K219 Gastro-esophageal reflux disease without esophagitis: Secondary | ICD-10-CM | POA: Diagnosis present

## 2015-08-22 HISTORY — DX: Unspecified atrial fibrillation: I48.91

## 2015-08-22 HISTORY — DX: Personal history of other specified conditions: Z87.898

## 2015-08-22 HISTORY — DX: Atherosclerotic heart disease of native coronary artery without angina pectoris: I25.10

## 2015-08-22 HISTORY — DX: Nonrheumatic aortic (valve) insufficiency: I35.1

## 2015-08-22 HISTORY — DX: Essential (primary) hypertension: I10

## 2015-08-22 HISTORY — DX: Aortic aneurysm of unspecified site, without rupture: I71.9

## 2015-08-22 HISTORY — DX: Type 2 diabetes mellitus without complications: E11.9

## 2015-08-22 LAB — BASIC METABOLIC PANEL
Anion gap: 10 (ref 5–15)
BUN: 12 mg/dL (ref 6–20)
CALCIUM: 9.2 mg/dL (ref 8.9–10.3)
CO2: 27 mmol/L (ref 22–32)
CREATININE: 0.75 mg/dL (ref 0.61–1.24)
Chloride: 102 mmol/L (ref 101–111)
GFR calc Af Amer: >60 mL/min (ref >60)
GLUCOSE: 161 mg/dL — AB (ref 65–99)
Potassium: 4.2 mmol/L (ref 3.5–5.1)
Sodium: 139 mmol/L (ref 135–145)

## 2015-08-22 LAB — CBC WITH DIFFERENTIAL/PLATELET
BASOS ABS: 0 10*3/uL (ref 0.0–0.1)
BASOS PCT: 0 %
EOS ABS: 0.2 10*3/uL (ref 0.0–0.7)
EOS PCT: 2 %
HCT: 38.1 % — ABNORMAL LOW (ref 39.0–52.0)
Hemoglobin: 13 g/dL (ref 13.0–17.0)
LYMPHS PCT: 13 %
Lymphs Abs: 1.4 10*3/uL (ref 0.7–4.0)
MCH: 32 pg (ref 26.0–34.0)
MCHC: 34.1 g/dL (ref 30.0–36.0)
MCV: 93.8 fL (ref 78.0–100.0)
MONO ABS: 0.8 10*3/uL (ref 0.1–1.0)
Monocytes Relative: 8 %
Neutro Abs: 8.8 10*3/uL — ABNORMAL HIGH (ref 1.7–7.7)
Neutrophils Relative %: 77 %
PLATELETS: 169 10*3/uL (ref 150–400)
RBC: 4.06 MIL/uL — ABNORMAL LOW (ref 4.22–5.81)
RDW: 13.1 % (ref 11.5–15.5)
WBC: 11.3 10*3/uL — ABNORMAL HIGH (ref 4.0–10.5)

## 2015-08-22 LAB — BRAIN NATRIURETIC PEPTIDE: B NATRIURETIC PEPTIDE 5: 234 pg/mL — AB (ref 0.0–100.0)

## 2015-08-22 LAB — TSH: TSH: 2.224 u[IU]/mL (ref 0.350–4.500)

## 2015-08-22 LAB — TROPONIN I

## 2015-08-22 MED ORDER — ENOXAPARIN SODIUM 40 MG/0.4ML ~~LOC~~ SOLN
40.0000 mg | SUBCUTANEOUS | Status: DC
  Administered 2015-08-22 – 2015-08-24 (×3): 40 mg via SUBCUTANEOUS
  Filled 2015-08-22 (×3): qty 0.4

## 2015-08-22 MED ORDER — ONDANSETRON HCL 4 MG/2ML IJ SOLN: 4.0000 mg | Freq: Four times a day (QID) | INTRAMUSCULAR | Status: DC | PRN

## 2015-08-22 MED ORDER — LORAZEPAM 2 MG/ML IJ SOLN
1.0000 mg | Freq: Once | INTRAMUSCULAR | Status: AC
  Administered 2015-08-22: 1 mg via INTRAVENOUS
  Filled 2015-08-22: qty 1

## 2015-08-22 MED ORDER — AMLODIPINE BESYLATE 5 MG PO TABS
10.0000 mg | ORAL_TABLET | Freq: Every day | ORAL | Status: DC
  Administered 2015-08-22 – 2015-08-25 (×4): 10 mg via ORAL
  Filled 2015-08-22 (×4): qty 2

## 2015-08-22 MED ORDER — NAPROXEN SODIUM 220 MG PO CAPS: 220.0000 mg | ORAL_CAPSULE | Freq: Two times a day (BID) | ORAL | Status: DC

## 2015-08-22 MED ORDER — CARVEDILOL 12.5 MG PO TABS
12.5000 mg | ORAL_TABLET | Freq: Two times a day (BID) | ORAL | Status: DC
  Administered 2015-08-22 – 2015-08-25 (×6): 12.5 mg via ORAL
  Filled 2015-08-22 (×6): qty 1

## 2015-08-22 MED ORDER — SODIUM CHLORIDE 0.9% FLUSH
3.0000 mL | Freq: Two times a day (BID) | INTRAVENOUS | Status: DC
  Administered 2015-08-22 – 2015-08-25 (×5): 3 mL via INTRAVENOUS

## 2015-08-22 MED ORDER — ACETAMINOPHEN 650 MG RE SUPP: 650.0000 mg | Freq: Four times a day (QID) | RECTAL | Status: DC | PRN

## 2015-08-22 MED ORDER — TORSEMIDE 20 MG PO TABS
100.0000 mg | ORAL_TABLET | Freq: Every day | ORAL | Status: DC
  Filled 2015-08-22: qty 5

## 2015-08-22 MED ORDER — SODIUM CHLORIDE 0.9% FLUSH
3.0000 mL | Freq: Two times a day (BID) | INTRAVENOUS | Status: DC
  Administered 2015-08-23 – 2015-08-24 (×3): 3 mL via INTRAVENOUS

## 2015-08-22 MED ORDER — SODIUM CHLORIDE 0.9% FLUSH: 3.0000 mL | INTRAVENOUS | Status: DC | PRN

## 2015-08-22 MED ORDER — ATORVASTATIN CALCIUM 20 MG PO TABS
20.0000 mg | ORAL_TABLET | Freq: Every day | ORAL | Status: DC
  Administered 2015-08-22 – 2015-08-25 (×4): 20 mg via ORAL
  Filled 2015-08-22 (×4): qty 1

## 2015-08-22 MED ORDER — SODIUM CHLORIDE 0.9 % IV SOLN: 250.0000 mL | INTRAVENOUS | Status: DC | PRN

## 2015-08-22 MED ORDER — IPRATROPIUM-ALBUTEROL 0.5-2.5 (3) MG/3ML IN SOLN
3.0000 mL | Freq: Four times a day (QID) | RESPIRATORY_TRACT | Status: DC
  Administered 2015-08-22: 3 mL via RESPIRATORY_TRACT
  Filled 2015-08-22: qty 3

## 2015-08-22 MED ORDER — IRBESARTAN 150 MG PO TABS
150.0000 mg | ORAL_TABLET | Freq: Every day | ORAL | Status: DC
  Administered 2015-08-22 – 2015-08-25 (×4): 150 mg via ORAL
  Filled 2015-08-22 (×4): qty 1

## 2015-08-22 MED ORDER — SODIUM CHLORIDE 0.9 % IV BOLUS (SEPSIS): 500.0000 mL | Freq: Once | INTRAVENOUS | Status: DC

## 2015-08-22 MED ORDER — GLIMEPIRIDE 2 MG PO TABS
2.0000 mg | ORAL_TABLET | Freq: Every day | ORAL | Status: DC
  Administered 2015-08-23: 2 mg via ORAL
  Filled 2015-08-22: qty 1

## 2015-08-22 MED ORDER — GUAIFENESIN ER 600 MG PO TB12
1200.0000 mg | ORAL_TABLET | Freq: Two times a day (BID) | ORAL | Status: DC
  Administered 2015-08-22 – 2015-08-25 (×6): 1200 mg via ORAL
  Filled 2015-08-22 (×6): qty 2

## 2015-08-22 MED ORDER — MAGNESIUM OXIDE 400 MG PO TABS
400.0000 mg | ORAL_TABLET | Freq: Every day | ORAL | Status: DC
  Administered 2015-08-22 – 2015-08-25 (×4): 400 mg via ORAL
  Filled 2015-08-22 (×8): qty 1

## 2015-08-22 MED ORDER — IPRATROPIUM-ALBUTEROL 0.5-2.5 (3) MG/3ML IN SOLN
3.0000 mL | Freq: Three times a day (TID) | RESPIRATORY_TRACT | Status: DC
  Administered 2015-08-23 – 2015-08-25 (×7): 3 mL via RESPIRATORY_TRACT
  Filled 2015-08-22 (×7): qty 3

## 2015-08-22 MED ORDER — LATANOPROST 0.005 % OP SOLN
1.0000 [drp] | Freq: Every day | OPHTHALMIC | Status: DC
  Administered 2015-08-23: 1 [drp] via OPHTHALMIC
  Filled 2015-08-22: qty 2.5

## 2015-08-22 MED ORDER — ACETAMINOPHEN 325 MG PO TABS: 650.0000 mg | ORAL_TABLET | Freq: Four times a day (QID) | ORAL | Status: DC | PRN

## 2015-08-22 MED ORDER — PANTOPRAZOLE SODIUM 40 MG PO TBEC
40.0000 mg | DELAYED_RELEASE_TABLET | Freq: Every day | ORAL | Status: DC
  Administered 2015-08-22 – 2015-08-25 (×4): 40 mg via ORAL
  Filled 2015-08-22 (×4): qty 1

## 2015-08-22 MED ORDER — ONDANSETRON HCL 4 MG PO TABS: 4.0000 mg | ORAL_TABLET | Freq: Four times a day (QID) | ORAL | Status: DC | PRN

## 2015-08-22 MED ORDER — CLOPIDOGREL BISULFATE 75 MG PO TABS
75.0000 mg | ORAL_TABLET | Freq: Every day | ORAL | Status: DC
  Administered 2015-08-22 – 2015-08-24 (×3): 75 mg via ORAL
  Filled 2015-08-22 (×3): qty 1

## 2015-08-22 MED ORDER — NAPROXEN 250 MG PO TABS
250.0000 mg | ORAL_TABLET | Freq: Two times a day (BID) | ORAL | Status: DC
  Administered 2015-08-22: 250 mg via ORAL
  Filled 2015-08-22 (×2): qty 1

## 2015-08-22 MED ORDER — CLOPIDOGREL BISULFATE 75 MG PO TABS: 75.0000 mg | ORAL_TABLET | Freq: Every day | ORAL | Status: DC

## 2015-08-22 MED ORDER — POTASSIUM CHLORIDE CRYS ER 20 MEQ PO TBCR
20.0000 meq | EXTENDED_RELEASE_TABLET | Freq: Two times a day (BID) | ORAL | Status: DC
  Administered 2015-08-22 – 2015-08-25 (×6): 20 meq via ORAL
  Filled 2015-08-22 (×7): qty 1

## 2015-08-22 NOTE — ED Notes (Signed)
CT called stating that patient refused CT. Patient was anxious and said he couldn't breath when lying down. Patient placed on 2 L O2 via [place] and Dr. Reather Converse has ordered 1 mg of ativan. Patient consents to try CT again.

## 2015-08-22 NOTE — ED Notes (Addendum)
Per EMS, pt from home had a near syncopal episode due to dizziness upon standing. Pt reports only feeling dizzy when moving. No hx of vertigo. Pt recently d/c himself off his Torsemide approx a week ago due to making him feel dehydrated. Pt has edema to bilateral lower extremities. Pt has hx of AFib and aortic aneurysm. AOx4. VSS

## 2015-08-22 NOTE — ED Provider Notes (Signed)
CSN: [id number]     Arrival date & time 08/22/15  1010 History  By signing my name below, I, Terry Wu, attest that this documentation has been prepared under the direction and in the presence of [name], MD. Electronically Signed: Irene Wu, ED Scribe. 08/22/2015. 12:30 PM.  Chief Complaint  Patient presents with  . Near Syncope   The history is provided by the patient. No language interpreter was used.  HPI Comments: Terry Wu is a 82 y.o. Male with a hx of HTN, aortic aneurysm, dizziness, and coronary artery bypass graft brought in by EMS to the Emergency Department complaining of off balance dizziness and lightheadedness onset 4 hours ago. Pt states that he felt like he was going to fall over because he was so dizzy this morning. He reports worsening dizziness with sitting up and states that is unable to walk on his own, even though he can normally. He reports associated headache that has since resolved. He states that he has not taken any of his daily medications today and has been keeping hydrated. Pt reports family hx of stroke, but denies personal hx of stroke. Pt takes Plavix daily. He is allergic to aspirin and states that he cannot have an MRI. He denies visual disturbance, chest pain, SOB, hematochezia, dysuria, syncope, weakness, or numbness.   Past Medical History  Diagnosis Date  . Mixed hyperlipidemia   . HYPERTENSION, BENIGN   . Shortness of breath   . Esophageal reflux   . DIZZINESS   . CORONARY ARTERY BYPASS GRAFT, HX OF   . AORTIC VALVE REPLACEMENT, HX OF   . AODM   . Aortic aneurysm [facility])    Past Surgical History  Procedure Laterality Date  . Coronary artery bypass graft    . Abdominal aortic aneurysm repair     Family History  Problem Relation Age of Onset  . Cancer    . Heart Problems Mother   . Stroke Father    Social History  Substance Use Topics  . Smoking status: Never Smoker   . Smokeless tobacco: Not on file  . Alcohol Use: Yes     Review of Systems  Eyes: Negative for visual disturbance.  Respiratory: Negative for shortness of breath.   Cardiovascular: Negative for chest pain.  Gastrointestinal: Negative for blood in stool.  Genitourinary: Negative for dysuria.  Neurological: Positive for dizziness, light-headedness and headaches. Negative for syncope, weakness and numbness.  All other systems reviewed and are negative.   Allergies  Aspirin and Demerol  Home Medications   Prior to Admission medications   Medication Sig Start Date End Date Taking? Authorizing Provider  amLODipine (NORVASC) 10 MG tablet TAKE ONE TABLET BY MOUTH ONCE DAILY 05/18/15  Yes Josue Hector, MD  amoxicillin (AMOXIL) 500 MG capsule Take 2,000 mg by mouth as directed.    Yes Historical Provider, MD  atorvastatin (LIPITOR) 20 MG tablet Take 1 tablet (20 mg total) by mouth daily. 07/15/15  Yes Josue Hector, MD  carvedilol (COREG) 12.5 MG tablet Take 1 tablet (12.5 mg total) by mouth 2 (two) times daily with a meal. 07/29/15  Yes Josue Hector, MD  clopidogrel (PLAVIX) 75 MG tablet Take 1 tablet (75 mg total) by mouth daily. 07/15/15  Yes Josue Hector, MD  Coenzyme Q10 (CO Q 10) 100 MG CAPS Take 1 capsule by mouth every morning.   Yes Historical Provider, MD  glimepiride (AMARYL) 2 MG tablet Take 2 mg by mouth daily before  breakfast.  02/16/13  Yes Sinda Du, MD  latanoprost (XALATAN) 0.005 % ophthalmic solution Place 1 drop into both eyes at bedtime.   Yes Historical Provider, MD  magnesium oxide (MAG-OX) 400 MG tablet Take 400 mg by mouth daily.   Yes Historical Provider, MD  Multiple Vitamins-Minerals (ICAPS PO) Take 2 tablets by mouth 3 (three) times daily after meals.    Yes Historical Provider, MD  Naproxen Sodium (ALEVE) 220 MG CAPS Take 220 mg by mouth 2 (two) times daily.   Yes Historical Provider, MD  pantoprazole (PROTONIX) 40 MG tablet Take 1 tablet (40 mg total) by mouth daily. 07/15/15  Yes Josue Hector, MD  potassium  chloride SA (K-DUR,KLOR-CON) 20 MEQ tablet Take 20 mEq by mouth 2 (two) times daily.   Yes Historical Provider, MD  telmisartan (MICARDIS) 40 MG tablet Take 40 mg by mouth daily. 02/16/13  Yes Sinda Du, MD  torsemide (DEMADEX) 100 MG tablet Take 100 mg by mouth daily.   Yes Historical Provider, MD  triamterene-hydrochlorothiazide (MAXZIDE-25) 37.5-25 MG per tablet Take 1 tablet by mouth daily.   Yes Historical Provider, MD   BP 153/85 mmHg  Pulse 84  Temp(Src) 97.5 F (36.4 C) (Oral)  Resp 16  Ht 5\' 9"  (1.753 m)  Wt 244 lb (110.678 kg)  BMI 36.02 kg/m2  SpO2 100% Physical Exam  Constitutional: He is oriented to person, place, and time. He appears well-developed and well-nourished.  Pt very unstable; cannot stand on his own  HENT:  Head: Normocephalic and atraumatic.  Mouth/Throat: Mucous membranes are dry.  Eyes: EOM are normal. Pupils are equal, round, and reactive to light.  Visual fields intact to fingers  Neck: Normal range of motion. Neck supple.  Cardiovascular: Normal heart sounds.  An irregular rhythm present. Tachycardia present.  Exam reveals no gallop and no friction rub.   No murmur heard. Pulmonary/Chest: Effort normal and breath sounds normal. He has no wheezes.  Crackles presents in the bases  Abdominal: Soft. There is no tenderness.  Musculoskeletal: Normal range of motion.  1+ pitting edema in bilateral LE  Neurological: He is alert and oriented to person, place, and time.  No pronator drift, finger-nose intact, no signs of facial droop; 5/5 strength in all extremities; no leg drift; sensation intact  Skin: Skin is warm and dry.  Psychiatric: He has a normal mood and affect. His behavior is normal.  Nursing note and vitals reviewed.   ED Course  Procedures (including critical care time) DIAGNOSTIC STUDIES: Oxygen Saturation is 95% on RA, adequate by my interpretation.    COORDINATION OF CARE: 10:32 AM-Discussed treatment plan which includes CT scan and  labs with pt at bedside and pt agreed to plan.    Labs Review Labs Reviewed  BASIC METABOLIC PANEL - Abnormal; Notable for the following:    Glucose, Bld 161 (*)    All other components within normal limits  CBC WITH DIFFERENTIAL/PLATELET - Abnormal; Notable for the following:    WBC 11.3 (*)    RBC 4.06 (*)    HCT 38.1 (*)    Neutro Abs 8.8 (*)    All other components within normal limits  BRAIN NATRIURETIC PEPTIDE - Abnormal; Notable for the following:    B Natriuretic Peptide 234.0 (*)    All other components within normal limits  TROPONIN I    Imaging Review Dg Chest 2 View  08/22/2015  CLINICAL DATA:  Near syncope. EXAM: CHEST  2 VIEW COMPARISON:  CT 07/15/2015  new FINDINGS: Sternotomy wires overlie enlarged cardiac silhouette with ectatic aorta. Uette. Large epicardial fat pad again noted. No fusion from infiltrate, pneumothorax. IMPRESSION: No acute cardiopulmonary process.  Stable cardiomegaly. Electronically Signed   By: Suzy Bouchard M.D.   On: 08/22/2015 11:47   I have personally reviewed and evaluated these images and lab results as part of my medical decision-making.   EKG Interpretation None     EKG reviewed heart rate 88, normal QT, irregular atrial fibrillation, poor baseline  MDM   Final diagnoses:  Gait difficulty  New onset atrial fibrillation [facility])   Patient presents with dizziness worse with movement. Concern for stroke as patient unable to ambulate or even stand without assistance. Patient did start torsemide recently. Patient denies history of atrial fibrillation.  With an ability to ambulate, new onset dizziness and concern for stroke plan for telemetry observation. Difficulty obtaining CT scan as patient symptomatic with lying flat.   The patients results and plan were reviewed and discussed.   Any x-rays performed were independently reviewed by myself.   Differential diagnosis were considered with the presenting HPI.  Medications  LORazepam  (ATIVAN) injection 1 mg (1 mg Intravenous Given 08/22/15 1122)    Filed Vitals:   08/22/15 1230 08/22/15 1300 08/22/15 1330 08/22/15 1331  BP: 145/92 141/93 153/85 153/85  Pulse: 93 82 86 84  Temp:      TempSrc:      Resp:    16  Height:      Weight:      SpO2: 98% 99% 100% 100%    Final diagnoses:  Gait difficulty  New onset atrial fibrillation ([place])    Admission/ observation were discussed with the admitting physician, patient and/or family and they are comfortable with the plan.    [name], MD 08/22/15 1341

## 2015-08-22 NOTE — H&P (Signed)
[facility] History and Physical  Terry Wu [id number] DOB: [birth year] DOA: 08/22/2015  Referring physician: Dr. Reather Converse, ER PCP: Alonza Bogus, MD   Chief Complaint: dizziness  HPI: Terry Wu is a 82 y.o. male who presents to the hospital with complaints of dizziness. Patient's symptoms began this morning after he woke up. He went to the bathroom and experienced extreme dizziness and difficulty walking. He denies any changes in vision, chest pain. He has chronic shortness of breath. He has chronic orthopnea. He has not had any fever, vomiting, diarrhea. He has a chronic cough. He has not been started on any new medications lately. In the ER he was found unable to stand due to his severe dizziness. He's been admitted to the hospital for further evaluation.  Review of Systems:  Pertinent positives as per HPI, otherwise negative   Past Medical History  Diagnosis Date  . Mixed hyperlipidemia   . HYPERTENSION, BENIGN   . Shortness of breath   . Esophageal reflux   . DIZZINESS   . CORONARY ARTERY BYPASS GRAFT, HX OF   . AORTIC VALVE REPLACEMENT, HX OF   . AODM   . Aortic aneurysm [facility])    Past Surgical History  Procedure Laterality Date  . Coronary artery bypass graft    . Abdominal aortic aneurysm repair     Social History:  reports that he has never smoked. He does not have any smokeless tobacco history on file. He reports that he drinks alcohol. He reports that he does not use illicit drugs.  Allergies  Allergen Reactions  . Aspirin     Unknown, per pt  . Demerol     Unknown, per pt    Family History  Problem Relation Age of Onset  . Cancer    . Heart Problems Mother   . Stroke Father     Prior to Admission medications   Medication Sig Start Date End Date Taking? Authorizing Provider  amLODipine (NORVASC) 10 MG tablet TAKE ONE TABLET BY MOUTH ONCE DAILY 05/18/15  Yes Josue Hector, MD  amoxicillin (AMOXIL) 500 MG capsule Take 2,000 mg by mouth  as directed.    Yes Historical Provider, MD  atorvastatin (LIPITOR) 20 MG tablet Take 1 tablet (20 mg total) by mouth daily. 07/15/15  Yes Josue Hector, MD  carvedilol (COREG) 12.5 MG tablet Take 1 tablet (12.5 mg total) by mouth 2 (two) times daily with a meal. 07/29/15  Yes Josue Hector, MD  clopidogrel (PLAVIX) 75 MG tablet Take 1 tablet (75 mg total) by mouth daily. 07/15/15  Yes Josue Hector, MD  Coenzyme Q10 (CO Q 10) 100 MG CAPS Take 1 capsule by mouth every morning.   Yes Historical Provider, MD  glimepiride (AMARYL) 2 MG tablet Take 2 mg by mouth daily before breakfast.  02/16/13  Yes Sinda Du, MD  latanoprost (XALATAN) 0.005 % ophthalmic solution Place 1 drop into both eyes at bedtime.   Yes Historical Provider, MD  magnesium oxide (MAG-OX) 400 MG tablet Take 400 mg by mouth daily.   Yes Historical Provider, MD  Multiple Vitamins-Minerals (ICAPS PO) Take 2 tablets by mouth 3 (three) times daily after meals.    Yes Historical Provider, MD  Naproxen Sodium (ALEVE) 220 MG CAPS Take 220 mg by mouth 2 (two) times daily.   Yes Historical Provider, MD  pantoprazole (PROTONIX) 40 MG tablet Take 1 tablet (40 mg total) by mouth daily. 07/15/15  Yes Josue Hector, MD  potassium chloride SA (K-DUR,KLOR-CON) 20 MEQ tablet Take 20 mEq by mouth 2 (two) times daily.   Yes Historical Provider, MD  telmisartan (MICARDIS) 40 MG tablet Take 40 mg by mouth daily. 02/16/13  Yes Sinda Du, MD  torsemide (DEMADEX) 100 MG tablet Take 100 mg by mouth daily.   Yes Historical Provider, MD  triamterene-hydrochlorothiazide (MAXZIDE-25) 37.5-25 MG per tablet Take 1 tablet by mouth daily.   Yes Historical Provider, MD   Physical Exam: Filed Vitals:   08/22/15 1400 08/22/15 1412 08/22/15 1430 08/22/15 1512  BP: 143/78 143/78 137/82 158/92  Pulse: 77 95  88  Temp:      TempSrc:      Resp:  14  18  Height:      Weight:      SpO2: 99% 100%  100%    Wt Readings from Last 3 Encounters:  08/22/15 110.678  kg (244 lb)  07/15/15 110.587 kg (243 lb 12.8 oz)  08/03/14 111.403 kg (245 lb 9.6 oz)    General:  Appears calm and comfortable Eyes: PERRL, normal lids, irises & conjunctiva ENT: grossly normal hearing, lips & tongue Neck: no LAD, masses or thyromegaly Cardiovascular: irregular, no m/r/g. 1+ LE edema. Telemetry: a fib Respiratory: coarse breath sounds bilaterally, mild increased resp effort Abdomen: soft, ntnd Skin: no rash or induration seen on limited exam Musculoskeletal: grossly normal tone BUE/BLE Psychiatric: grossly normal mood and affect, speech fluent and appropriate Neurologic: grossly non-focal.          Labs on Admission:  Basic Metabolic Panel:  Recent Labs Lab 08/22/15 1036  NA 139  K 4.2  CL 102  CO2 27  GLUCOSE 161*  BUN 12  CREATININE 0.75  CALCIUM 9.2   Liver Function Tests: No results for input(s): AST, ALT, ALKPHOS, BILITOT, PROT, ALBUMIN in the last 168 hours. No results for input(s): LIPASE, AMYLASE in the last 168 hours. No results for input(s): AMMONIA in the last 168 hours. CBC:  Recent Labs Lab 08/22/15 1036  WBC 11.3*  NEUTROABS 8.8*  HGB 13.0  HCT 38.1*  MCV 93.8  PLT 169   Cardiac Enzymes:  Recent Labs Lab 08/22/15 1036  TROPONINI <0.03    BNP (last 3 results)  Recent Labs  08/22/15 1036  BNP 234.0*    ProBNP (last 3 results) No results for input(s): PROBNP in the last 8760 hours.  CBG: No results for input(s): GLUCAP in the last 168 hours.  Radiological Exams on Admission: Dg Chest 2 View  08/22/2015  CLINICAL DATA:  Near syncope. EXAM: CHEST  2 VIEW COMPARISON:  CT 07/15/2015 new FINDINGS: Sternotomy wires overlie enlarged cardiac silhouette with ectatic aorta. Uette. Large epicardial fat pad again noted. No fusion from infiltrate, pneumothorax. IMPRESSION: No acute cardiopulmonary process.  Stable cardiomegaly. Electronically Signed   By: Suzy Bouchard M.D.   On: 08/22/2015 11:47   Ct Head Wo  Contrast  08/22/2015  CLINICAL DATA:  Dizziness with lightheadedness for 4 hours, new onset, unable to ambulate, history benign hypertension, coronary artery disease post CABG and AVR, COPD EXAM: CT HEAD WITHOUT CONTRAST TECHNIQUE: Contiguous axial images were obtained from the base of the skull through the vertex without intravenous contrast. COMPARISON:  None FINDINGS: Minimal atrophy. Normal ventricular morphology. No midline shift or mass effect. Normal appearance of brain parenchyma. No intracranial hemorrhage, mass lesion or evidence acute infarction. No extra-axial fluid collections. Extensive atherosclerotic calcifications of internal carotid and vertebral arteries at skullbase. Bones and sinuses unremarkable. IMPRESSION: No  acute intracranial abnormalities as above. Electronically Signed   By: Lavonia Dana M.D.   On: 08/22/2015 13:38    EKG: Independently reviewed. A fib with controlled rate  Assessment/Plan Active Problems:   Mixed hyperlipidemia   HYPERTENSION, BENIGN   S/P AVR   COPD (chronic obstructive pulmonary disease) (HCC)   Bilateral leg edema   Aortic aneurysm (HCC)   Gait difficulty   Dizziness   New onset atrial fibrillation (HCC)  1.  severe dizziness. Etiology is not entirely clear. Possibilities include vertigo, posterior circulation stroke, orthostasis. Patient had a CT scan of the head that was unremarkable. He reports that he will not be able to tolerate an MRI since he is unable to lie flat for extended period of time. Will check orthostatics. Request physical therapy consult for vestibular evaluation. Check urinalysis. Cycle cardiac markers and monitor on telemetry. 2. New-onset atrial fibrillation. Found to be in atrial fibrillation the emergency room. Question if this is contributing to #1. Continue to monitor on telemetry, check cardiac markers, TSH and echocardiogram. Consult cardiology for further management. 3. Hypertension. Continue outpatient  regimen 4. Hyperlipidemia. Continue statin 5. Bilateral lower extremity edema. Patient has not been taking his diuretics for the past week. Will restart torsemide. 6. COPD. No evidence of wheezing. Will continue bronchodilators. 7. Aortic aneurysm. Chronic . follow-up with cardiology.    Code Status: full code DVT Prophylaxis: lovenox Family Communication: discussed with wife at the bedside Disposition Plan: discharge home once improved  Time spent: 79mins  Othelia Riederer [facility] Pager [phone]

## 2015-08-22 NOTE — Progress Notes (Signed)
Pt. Refused orthostatic VS. States "I'm too weak."

## 2015-08-23 ENCOUNTER — Encounter (HOSPITAL_COMMUNITY): Payer: Self-pay | Admitting: Adult Health

## 2015-08-23 ENCOUNTER — Observation Stay (HOSPITAL_BASED_OUTPATIENT_CLINIC_OR_DEPARTMENT_OTHER): Payer: Medicare Other

## 2015-08-23 DIAGNOSIS — I4891 Unspecified atrial fibrillation: Secondary | ICD-10-CM

## 2015-08-23 DIAGNOSIS — E119 Type 2 diabetes mellitus without complications: Secondary | ICD-10-CM | POA: Diagnosis not present

## 2015-08-23 DIAGNOSIS — I481 Persistent atrial fibrillation: Secondary | ICD-10-CM | POA: Diagnosis not present

## 2015-08-23 DIAGNOSIS — I10 Essential (primary) hypertension: Secondary | ICD-10-CM | POA: Diagnosis not present

## 2015-08-23 DIAGNOSIS — I2510 Atherosclerotic heart disease of native coronary artery without angina pectoris: Secondary | ICD-10-CM

## 2015-08-23 DIAGNOSIS — R42 Dizziness and giddiness: Secondary | ICD-10-CM

## 2015-08-23 DIAGNOSIS — Z954 Presence of other heart-valve replacement: Secondary | ICD-10-CM | POA: Diagnosis not present

## 2015-08-23 DIAGNOSIS — I110 Hypertensive heart disease with heart failure: Secondary | ICD-10-CM | POA: Diagnosis not present

## 2015-08-23 LAB — COMPREHENSIVE METABOLIC PANEL
ALT: 15 U/L — AB (ref 17–63)
AST: 15 U/L (ref 15–41)
Albumin: 3.5 g/dL (ref 3.5–5.0)
Alkaline Phosphatase: 26 U/L — ABNORMAL LOW (ref 38–126)
Anion gap: 8 (ref 5–15)
BILIRUBIN TOTAL: 0.6 mg/dL (ref 0.3–1.2)
BUN: 11 mg/dL (ref 6–20)
CHLORIDE: 103 mmol/L (ref 101–111)
CO2: 27 mmol/L (ref 22–32)
CREATININE: 0.67 mg/dL (ref 0.61–1.24)
Calcium: 8.6 mg/dL — ABNORMAL LOW (ref 8.9–10.3)
Glucose, Bld: 149 mg/dL — ABNORMAL HIGH (ref 65–99)
POTASSIUM: 4 mmol/L (ref 3.5–5.1)
Sodium: 138 mmol/L (ref 135–145)
TOTAL PROTEIN: 6.2 g/dL — AB (ref 6.5–8.1)

## 2015-08-23 LAB — URINALYSIS, ROUTINE W REFLEX MICROSCOPIC
Bilirubin Urine: NEGATIVE
GLUCOSE, UA: NEGATIVE mg/dL
Hgb urine dipstick: NEGATIVE
KETONES UR: NEGATIVE mg/dL
LEUKOCYTES UA: NEGATIVE
NITRITE: NEGATIVE
PROTEIN: NEGATIVE mg/dL
Specific Gravity, Urine: 1.015 (ref 1.005–1.030)
pH: 6.5 (ref 5.0–8.0)

## 2015-08-23 LAB — GLUCOSE, CAPILLARY
Glucose-Capillary: 149 mg/dL — ABNORMAL HIGH (ref 65–99)
Glucose-Capillary: 88 mg/dL (ref 65–99)
Glucose-Capillary: 87 mg/dL (ref 65–99)

## 2015-08-23 LAB — CBC
HCT: 33.4 % — ABNORMAL LOW (ref 39.0–52.0)
Hemoglobin: 11.5 g/dL — ABNORMAL LOW (ref 13.0–17.0)
MCH: 32.4 pg (ref 26.0–34.0)
MCHC: 34.4 g/dL (ref 30.0–36.0)
MCV: 94.1 fL (ref 78.0–100.0)
PLATELETS: 144 10*3/uL — AB (ref 150–400)
RBC: 3.55 MIL/uL — AB (ref 4.22–5.81)
RDW: 13 % (ref 11.5–15.5)
WBC: 9.1 10*3/uL (ref 4.0–10.5)

## 2015-08-23 LAB — TROPONIN I: Troponin I: <0.03 ng/mL (ref <0.031)

## 2015-08-23 MED ORDER — TORSEMIDE 20 MG PO TABS
40.0000 mg | ORAL_TABLET | Freq: Every day | ORAL | Status: DC
  Administered 2015-08-23: 40 mg via ORAL
  Filled 2015-08-23: qty 2

## 2015-08-23 MED ORDER — INSULIN ASPART 100 UNIT/ML ~~LOC~~ SOLN
0.0000 [IU] | Freq: Three times a day (TID) | SUBCUTANEOUS | Status: DC
  Administered 2015-08-23 – 2015-08-24 (×2): 3 [IU] via SUBCUTANEOUS

## 2015-08-23 MED ORDER — INSULIN ASPART 100 UNIT/ML ~~LOC~~ SOLN: 0.0000 [IU] | Freq: Every day | SUBCUTANEOUS | Status: DC

## 2015-08-23 NOTE — Care Management Note (Signed)
Case Management Note  Patient Details  Name: RESEAN KAPPLE MRN: [id number] Date of Birth: 1932-02-25  Subjective/Objective:                  Pt is from home, admitted for ataxia and dizziness. Pt is form home, lives with wife and is ind with ADL's at baseline. Pt ambulates independently at baseline with no use of cane or walker. Pt does have a cane and walker if he needs one. PT has recommended CIR, OT consult placed to provide second opinion. Pt is observation and if not accepted into CIR will not have qualifying 3 day stay for SNF. Pt is aware and states "what am I supposed to do? I can't walk." Pt is unable to pay for SNF OOP. PT is recommending neuro consult.   Action/Plan: Will cont to follow for DC planning.   Expected Discharge Date:  08/23/15               Expected Discharge Plan:    CIR vs SNF vs [facility]  In-House Referral:  NA  Discharge planning Services  CM Consult  Post Acute Care Choice:    Choice offered to:     DME Arranged:    DME Agency:     HH Arranged:    HH Agency:     Status of Service:  In process, will continue to follow  Medicare Important Message Given:    Date Medicare IM Given:    Medicare IM give by:    Date Additional Medicare IM Given:    Additional Medicare Important Message give by:     If discussed at [place] of Stay Meetings, dates discussed:    Additional Comments:  Sherald Barge, RN 08/23/2015, 3:25 PM

## 2015-08-23 NOTE — Plan of Care (Signed)
Problem: Acute Rehab PT Goals(only PT should resolve) Goal: Patient Will Transfer Sit To/From Stand Pt will transfer sit to/from-stand with standard walker at ModI without loss-of-balance to demonstrate good safety awareness for independent mobility in home.     Goal: Pt Will Ambulate Pt will ambulate with RW at Supervision using a step-through pattern and equal step length for a distance greater than 233ft to demonstrate the ability to perform safe household distance ambulation at discharge.  Goal: Pt Will Go Up/Down Stairs Pt will ascend/descend 20 stairs with LRAD and 1 HR at Supervision to demonstrate access of home living environment.

## 2015-08-23 NOTE — Progress Notes (Signed)
Subjective: He was admitted yesterday with the new onset of atrial fibrillation and extreme weakness from that. He has a history of coronary artery disease with coronary artery bypass grafting and has had aortic valve replacement in 2006. He does have a thoracic aortic aneurysm. He says he feels a little bit better. He has some swelling of the extremities. No chest pain.  Objective: Vital signs in last 24 hours: Temp:  [97.5 F (36.4 C)-98.2 F (36.8 C)] 98.2 F (36.8 C) (02/28 0556) Pulse Rate:  [77-140] 80 (02/28 0556) Resp:  [14-20] 18 (02/28 0730) BP: (133-167)/(69-97) 133/69 mmHg (02/28 0556) SpO2:  [94 %-100 %] 94 % (02/28 0730) Weight:  [110.6 kg (243 lb 13.3 oz)-110.678 kg (244 lb)] 110.61 kg (243 lb 13.6 oz) (02/28 0557) Weight change:  Last BM Date: 08/22/15  Intake/Output from previous day: 02/27 0701 - 02/28 0700 In: -  Out: 1250 [Urine:1250]  PHYSICAL EXAM General appearance: alert, cooperative and mild distress Resp: clear to auscultation bilaterally Cardio: irregularly irregular rhythm GI: soft, non-tender; bowel sounds normal; no masses,  no organomegaly Extremities: 1+ edema bilaterally  Lab Results:  Results for orders placed or performed during the hospital encounter of 08/22/15 (from the past 48 hour(s))  Basic metabolic panel     Status: Abnormal   Collection Time: 08/22/15 10:36 AM  Result Value Ref Range   Sodium 139 135 - 145 mmol/L   Potassium 4.2 3.5 - 5.1 mmol/L   Chloride 102 101 - 111 mmol/L   CO2 27 22 - 32 mmol/L   Glucose, Bld 161 (H) 65 - 99 mg/dL   BUN 12 6 - 20 mg/dL   Creatinine, Ser 0.75 0.61 - 1.24 mg/dL   Calcium 9.2 8.9 - 10.3 mg/dL   GFR calc non Af Amer >60 >60 mL/min   GFR calc Af Amer >60 >60 mL/min    Comment: (NOTE) The eGFR has been calculated using the CKD EPI equation. This calculation has not been validated in all clinical situations. eGFR's persistently <60 mL/min signify possible Chronic Kidney Disease.    Anion  gap 10 5 - 15  CBC with Differential/Platelet     Status: Abnormal   Collection Time: 08/22/15 10:36 AM  Result Value Ref Range   WBC 11.3 (H) 4.0 - 10.5 K/uL   RBC 4.06 (L) 4.22 - 5.81 MIL/uL   Hemoglobin 13.0 13.0 - 17.0 g/dL   HCT 38.1 (L) 39.0 - 52.0 %   MCV 93.8 78.0 - 100.0 fL   MCH 32.0 26.0 - 34.0 pg   MCHC 34.1 30.0 - 36.0 g/dL   RDW 13.1 11.5 - 15.5 %   Platelets 169 150 - 400 K/uL   Neutrophils Relative % 77 %   Neutro Abs 8.8 (H) 1.7 - 7.7 K/uL   Lymphocytes Relative 13 %   Lymphs Abs 1.4 0.7 - 4.0 K/uL   Monocytes Relative 8 %   Monocytes Absolute 0.8 0.1 - 1.0 K/uL   Eosinophils Relative 2 %   Eosinophils Absolute 0.2 0.0 - 0.7 K/uL   Basophils Relative 0 %   Basophils Absolute 0.0 0.0 - 0.1 K/uL  Troponin I     Status: None   Collection Time: 08/22/15 10:36 AM  Result Value Ref Range   Troponin I <0.03 <0.031 ng/mL    Comment:        NO INDICATION OF MYOCARDIAL INJURY.   Brain natriuretic peptide     Status: Abnormal   Collection Time: 08/22/15 10:36 AM  Result Value Ref Range   B Natriuretic Peptide 234.0 (H) 0.0 - 100.0 pg/mL  TSH     Status: None   Collection Time: 08/22/15 10:36 AM  Result Value Ref Range   TSH 2.224 0.350 - 4.500 uIU/mL  Troponin I     Status: None   Collection Time: 08/22/15  7:55 PM  Result Value Ref Range   Troponin I <0.03 <0.031 ng/mL    Comment:        NO INDICATION OF MYOCARDIAL INJURY.   Comprehensive metabolic panel     Status: Abnormal   Collection Time: 08/23/15  1:50 AM  Result Value Ref Range   Sodium 138 135 - 145 mmol/L   Potassium 4.0 3.5 - 5.1 mmol/L   Chloride 103 101 - 111 mmol/L   CO2 27 22 - 32 mmol/L   Glucose, Bld 149 (H) 65 - 99 mg/dL   BUN 11 6 - 20 mg/dL   Creatinine, Ser 0.67 0.61 - 1.24 mg/dL   Calcium 8.6 (L) 8.9 - 10.3 mg/dL   Total Protein 6.2 (L) 6.5 - 8.1 g/dL   Albumin 3.5 3.5 - 5.0 g/dL   AST 15 15 - 41 U/L   ALT 15 (L) 17 - 63 U/L   Alkaline Phosphatase 26 (L) 38 - 126 U/L   Total  Bilirubin 0.6 0.3 - 1.2 mg/dL   GFR calc non Af Amer >60 >60 mL/min   GFR calc Af Amer >60 >60 mL/min    Comment: (NOTE) The eGFR has been calculated using the CKD EPI equation. This calculation has not been validated in all clinical situations. eGFR's persistently <60 mL/min signify possible Chronic Kidney Disease.    Anion gap 8 5 - 15  CBC     Status: Abnormal   Collection Time: 08/23/15  1:50 AM  Result Value Ref Range   WBC 9.1 4.0 - 10.5 K/uL   RBC 3.55 (L) 4.22 - 5.81 MIL/uL   Hemoglobin 11.5 (L) 13.0 - 17.0 g/dL   HCT 33.4 (L) 39.0 - 52.0 %   MCV 94.1 78.0 - 100.0 fL   MCH 32.4 26.0 - 34.0 pg   MCHC 34.4 30.0 - 36.0 g/dL   RDW 13.0 11.5 - 15.5 %   Platelets 144 (L) 150 - 400 K/uL  Troponin I     Status: None   Collection Time: 08/23/15  1:50 AM  Result Value Ref Range   Troponin I <0.03 <0.031 ng/mL    Comment:        NO INDICATION OF MYOCARDIAL INJURY.   Urinalysis, Routine w reflex microscopic (not at [facility])     Status: None   Collection Time: 08/23/15  6:35 AM  Result Value Ref Range   Color, Urine YELLOW YELLOW   APPearance CLEAR CLEAR   Specific Gravity, Urine 1.015 1.005 - 1.030   pH 6.5 5.0 - 8.0   Glucose, UA NEGATIVE NEGATIVE mg/dL   Hgb urine dipstick NEGATIVE NEGATIVE   Bilirubin Urine NEGATIVE NEGATIVE   Ketones, ur NEGATIVE NEGATIVE mg/dL   Protein, ur NEGATIVE NEGATIVE mg/dL   Nitrite NEGATIVE NEGATIVE   Leukocytes, UA NEGATIVE NEGATIVE    Comment: MICROSCOPIC NOT DONE ON URINES WITH NEGATIVE PROTEIN, BLOOD, LEUKOCYTES, NITRITE, OR GLUCOSE <1000 mg/dL.    ABGS No results for input(s): PHART, PO2ART, TCO2, HCO3 in the last 72 hours.  Invalid input(s): PCO2 CULTURES No results found for this or any previous visit (from the past 240 hour(s)). Studies/Results: Dg Chest 2 View  08/22/2015  CLINICAL DATA:  Near syncope. EXAM: CHEST  2 VIEW COMPARISON:  CT 07/15/2015 new FINDINGS: Sternotomy wires overlie enlarged cardiac silhouette with ectatic  aorta. Uette. Large epicardial fat pad again noted. No fusion from infiltrate, pneumothorax. IMPRESSION: No acute cardiopulmonary process.  Stable cardiomegaly. Electronically Signed   By: Suzy Bouchard M.D.   On: 08/22/2015 11:47   Ct Head Wo Contrast  08/22/2015  CLINICAL DATA:  Dizziness with lightheadedness for 4 hours, new onset, unable to ambulate, history benign hypertension, coronary artery disease post CABG and AVR, COPD EXAM: CT HEAD WITHOUT CONTRAST TECHNIQUE: Contiguous axial images were obtained from the base of the skull through the vertex without intravenous contrast. COMPARISON:  None FINDINGS: Minimal atrophy. Normal ventricular morphology. No midline shift or mass effect. Normal appearance of brain parenchyma. No intracranial hemorrhage, mass lesion or evidence acute infarction. No extra-axial fluid collections. Extensive atherosclerotic calcifications of internal carotid and vertebral arteries at skullbase. Bones and sinuses unremarkable. IMPRESSION: No acute intracranial abnormalities as above. Electronically Signed   By: Lavonia Dana M.D.   On: 08/22/2015 13:38    Medications:  Prior to Admission:  Prescriptions prior to admission  Medication Sig Dispense Refill Last Dose  . amLODipine (NORVASC) 10 MG tablet TAKE ONE TABLET BY MOUTH ONCE DAILY 30 tablet 10 08/21/2015 at Unknown time  . amoxicillin (AMOXIL) 500 MG capsule Take 2,000 mg by mouth as directed.    08/21/2015 at Unknown time  . atorvastatin (LIPITOR) 20 MG tablet Take 1 tablet (20 mg total) by mouth daily. 90 tablet 3 08/21/2015 at Unknown time  . carvedilol (COREG) 12.5 MG tablet Take 1 tablet (12.5 mg total) by mouth 2 (two) times daily with a meal. 180 tablet 3 08/21/2015 at 0730  . clopidogrel (PLAVIX) 75 MG tablet Take 1 tablet (75 mg total) by mouth daily. 90 tablet 3 08/21/2015 at 0900  . Coenzyme Q10 (CO Q 10) 100 MG CAPS Take 1 capsule by mouth every morning.   08/21/2015 at Unknown time  . glimepiride (AMARYL) 2  MG tablet Take 2 mg by mouth daily before breakfast.    08/21/2015 at Unknown time  . latanoprost (XALATAN) 0.005 % ophthalmic solution Place 1 drop into both eyes at bedtime.   08/21/2015 at Unknown time  . magnesium oxide (MAG-OX) 400 MG tablet Take 400 mg by mouth daily.   08/21/2015 at Unknown time  . Multiple Vitamins-Minerals (ICAPS PO) Take 2 tablets by mouth 3 (three) times daily after meals.    08/21/2015 at Unknown time  . Naproxen Sodium (ALEVE) 220 MG CAPS Take 220 mg by mouth 2 (two) times daily.   08/21/2015 at Unknown time  . pantoprazole (PROTONIX) 40 MG tablet Take 1 tablet (40 mg total) by mouth daily. 90 tablet 3 08/21/2015 at Unknown time  . potassium chloride SA (K-DUR,KLOR-CON) 20 MEQ tablet Take 20 mEq by mouth 2 (two) times daily.   08/21/2015 at Unknown time  . telmisartan (MICARDIS) 40 MG tablet Take 40 mg by mouth daily.   08/21/2015 at Unknown time  . torsemide (DEMADEX) 100 MG tablet Take 100 mg by mouth daily.   08/21/2015 at Unknown time  . triamterene-hydrochlorothiazide (MAXZIDE-25) 37.5-25 MG per tablet Take 1 tablet by mouth daily.   08/21/2015 at Unknown time   Scheduled: . amLODipine  10 mg Oral Daily  . atorvastatin  20 mg Oral Daily  . carvedilol  12.5 mg Oral BID WC  . clopidogrel  75 mg Oral Daily  .  enoxaparin (LOVENOX) injection  40 mg Subcutaneous Q24H  . glimepiride  2 mg Oral QAC breakfast  . guaiFENesin  1,200 mg Oral BID  . ipratropium-albuterol  3 mL Nebulization TID  . irbesartan  150 mg Oral Daily  . latanoprost  1 drop Both Eyes QHS  . magnesium oxide  400 mg Oral Daily  . naproxen  250 mg Oral BID WC  . pantoprazole  40 mg Oral Daily  . potassium chloride SA  20 mEq Oral BID  . sodium chloride flush  3 mL Intravenous Q12H  . sodium chloride flush  3 mL Intravenous Q12H  . torsemide  40 mg Oral Daily   Continuous:  EUX:BPQSOX chloride, acetaminophen **OR** acetaminophen, ondansetron **OR** ondansetron (ZOFRAN) IV, sodium chloride  flush  Assesment: He has new onset atrial fibrillation. He has multiple other cardiac issues including previous coronary artery bypass grafting and a tissue aortic valve replacement approximately 11 years ago. He has a thoracic aortic aneurysm. He has a history of COPD and some element of heart failure. Active Problems:   Mixed hyperlipidemia   HYPERTENSION, BENIGN   S/P AVR   COPD (chronic obstructive pulmonary disease) (HCC)   Bilateral leg edema   Aortic aneurysm (HCC)   Gait difficulty   Dizziness   New onset atrial fibrillation ([place])    Plan: Continue treatments. Cardiology consultation has been requested and is underway      Zian Delair L 08/23/2015, 9:07 AM

## 2015-08-23 NOTE — Consult Note (Addendum)
CARDIOLOGY CONSULT NOTE   Patient ID: Terry Wu MRN: [id number] DOB/AGE: 1932/05/14 82 y.o.  Admit Date: 08/22/2015 Requesting Physician: Sinda Du MD Primary Physician: Alonza Bogus, MD Consulting Cardiologist: Rozann Lesches MD Primary Cardiologist: Jenkins Rouge MD Reason for Consultation: New Onset Atrial fibrillation  Clinical Summary [name] is a 82 y.o.male with known history of CAD with CABG (LIMA to LAD, SVG to diagonal, SVG to OM, SVG to PDA) and tissue AVR (46mm Edwards Pericaridal Magna Valve) in 2006, ascending thoracic aneurysm (4.8 cm per CT January 2017), who was admitted with complaints of dizziness and weakness. He is found to have atrial fibrillation with a rate of 88 bpm, newly documented.  He states that he has had recurring episodes of vertigo over a periods of weeks, no clear precipitant - sometimes occurs while seated. Morning of admission he states that he felt normal, went to brush his  teeth, then while waling to another room suddenly felt lightheadedness as if he might fall, was able to sit quickly in a chair. He did not lose consciousness. He became very nauseated but did not throw up. His wife called EMS. He denies any syncope, chest pain, palpitations. He has never been diagnosed with atrial fibrillation before. Last ECG with Dr. [name] documented atrial bigeminy. Dr. [name] documented that the patient was having some dizziness at January visit.  On arrival to ER, BP 167/97, HR 88 bpm, O2 Sat 95%, afebrile. Chemistries essentially unremarkable, but was found to have leukocytosis, WBC 11.3 with elevated neutrophils. Troponin has been negative X 3. ECG demonstrated atrial fibrillation rate of 88 bpm. CT scan of the head, negative for acute intracranial abnormalities. CXR negative for CHF or pneumonia.  On review of telemetry, he remains in atrial fibrillation but has had some pauses, 2.0 -2.3 seconds, not clearly symptomatic. Heart rate has  generally been between 60 and 80 bpm.  He denies dizziness while in the bed but any time he gets up, the dizziness is significant. He refused orthostatics earlier as well as MRI.  Allergies  Allergen Reactions  . Aspirin     Unknown, per pt  . Demerol     Unknown, per pt    Medications Scheduled Medications: . amLODipine  10 mg Oral Daily  . atorvastatin  20 mg Oral Daily  . carvedilol  12.5 mg Oral BID WC  . clopidogrel  75 mg Oral Daily  . enoxaparin (LOVENOX) injection  40 mg Subcutaneous Q24H  . guaiFENesin  1,200 mg Oral BID  . insulin aspart  0-15 Units Subcutaneous TID WC  . insulin aspart  0-5 Units Subcutaneous QHS  . ipratropium-albuterol  3 mL Nebulization TID  . irbesartan  150 mg Oral Daily  . latanoprost  1 drop Both Eyes QHS  . magnesium oxide  400 mg Oral Daily  . pantoprazole  40 mg Oral Daily  . potassium chloride SA  20 mEq Oral BID  . sodium chloride flush  3 mL Intravenous Q12H  . sodium chloride flush  3 mL Intravenous Q12H  . torsemide  40 mg Oral Daily    PRN Medications: sodium chloride, acetaminophen **OR** acetaminophen, ondansetron **OR** ondansetron (ZOFRAN) IV, sodium chloride flush   Past Medical History  Diagnosis Date  . Mixed hyperlipidemia   . Essential hypertension   . Esophageal reflux   . CAD (coronary artery disease)     Multivessel status post CABG 2006  . Aortic regurgitation     Bioprosthetic AVR 2006  .  Type 2 diabetes mellitus ([place])   . Aortic aneurysm ([facility])     Ascending thoracic - 4.8 cm  . History of dizziness     Past Surgical History  Procedure Laterality Date  . Coronary artery bypass graft  2006    LIMA to LAD, SVG to diagonal, SVG to OM, SVG to PDA   . Aortic valve replacement  2006    Edwards Pericardial tissue valve  . Total knee arthroplasty Bilateral   . Hernia repair      Family History  Problem Relation Age of Onset  . Cancer    . Heart Problems Mother   . Stroke Father     Social History Mr.  Lukehart reports that he has never smoked. He does not have any smokeless tobacco history on file. [name] reports that he drinks alcohol.  Review of Systems Complete review of systems are found to be negative unless outlined in H&P above and the following. Intermittent leg edema for which he uses as needed Demadex. This has been worse recently.  Physical Examination Blood pressure 133/69, pulse 80, temperature 98.2 F (36.8 C), temperature source Oral, resp. rate 18, height 5\' 9"  (1.753 m), weight 243 lb 13.6 oz (110.61 kg), SpO2 94 %.  Intake/Output Summary (Last 24 hours) at 08/23/15 0914 Last data filed at 08/23/15 0557  Gross per 24 hour  Intake      0 ml  Output   1250 ml  Net  -1250 ml    Telemetry: Atrial fibrillation rates in the 70's with asymptomatic pauses 2.0 -2.3 seconds.   GEN: Obese male, no reported chest pain or shortness of breath at rest. HEENT: Conjunctiva and lids normal, oropharynx clear. Neck: Supple, increased girth, difficult to assess JVP, no thyromegaly. Lungs: Decreased breath sounds with prolonged expiratory phase and mild inferior wheeze, nonlabored breathing at rest.  Cardiac: Irregularly irregular, no S3, soft basal systolic murmur, no pericardial rub. Abdomen: Obese, nontender, active bowel sounds present, no guarding or rebound. Extremities: Stasis with 2+ leg edema below the knees, distal pulses 1-2+. Skin: Warm and dry. Musculoskeletal: No kyphosis. Neuropsychiatric: Alert and oriented x3, affect grossly appropriate.  Prior Cardiac Testing/Procedures 1.Cardiac Cath 2006 Left main coronary artery had 20% discrete stenosis.  The proximal LAD was heavily calcified. There was 70% diffuse disease proximally. The mid LAD just at the take-off of the second diagonal branch had a 70% eccentric lesion. The first diagonal branch had 30-40% multiple discrete lesions.  Circumflex coronary artery was normal proximally. There was a  40-50% proximal stenosis and a large first obtuse marginal branch. The distal AV groove branch was normal.  Right coronary artery was dominant. There was an aneurysmal segment proximally with an 80% discrete lesion just prior to it. The distal vessel had 30-40% multiple discrete lesions.  We could not cross the ventricle due to the patient's iliac tortuosity and flimsiness of the catheters. The patient has normal left ventricular function by Cardiolite and echo. Aortic root injection was performed. The aortic root itself was somewhat unfolded but not particularly enlarged. He had grade 3/4 angiographic aortic insufficiency.  IMPRESSION: The patient will be referred to CVTS for possible two-vessel coronary artery bypass grafting and AVR. I will have our physicians review the films. However, the calcification in the proximal aorta branch point of the second diagonal branch make the LAD lesions difficult to intervene on. Similarly the right coronary artery has an aneurysmal segment with significant mismatch in vessel size proximally. Also I think  his aortic insufficiency is at least angiographic grade 3/4 and he has a left ventricular enlargement by echo.  Echocardiogram 02/10/2013 Left ventricle: The cavity size was normal. There was moderate concentric hypertrophy. Systolic function was normal. The estimated ejection fraction was in the range of 60% to 65%. Although no diagnostic regional wall motion abnormality was identified, this possibility cannot be completely excluded on the basis of this study. Doppler parameters are consistent with abnormal left ventricular relaxation (grade 1 diastolic dysfunction). - Aortic valve: A bioprosthesis was present. No significant regurgitation. Mean gradient: 53mm Hg (S). VTI ratio of LVOT to aortic valve: 0.52. No paravalvuvlar leak. - Aortic root: The aortic root was upper normal in size. -  Mitral valve: Calcified annulus. Trivial regurgitation. - Left atrium: The atrium was mildly dilated. - Pulmonary arteries: Systolic pressure could not be accurately estimated. - Inferior vena cava: Not visualized. Unable to estimate CVP. - Pericardium, extracardiac: There was no pericardial effusion.  Lexiscan Myoview 2012 Normal myovue study with no evidence of ischemia or infarction  Lab Results  Basic Metabolic Panel:  Recent Labs Lab 08/22/15 1036 08/23/15 0150  NA 139 138  K 4.2 4.0  CL 102 103  CO2 27 27  GLUCOSE 161* 149*  BUN 12 11  CREATININE 0.75 0.67  CALCIUM 9.2 8.6*    Liver Function Tests:  Recent Labs Lab 08/23/15 0150  AST 15  ALT 15*  ALKPHOS 26*  BILITOT 0.6  PROT 6.2*  ALBUMIN 3.5    CBC:  Recent Labs Lab 08/22/15 1036 08/23/15 0150  WBC 11.3* 9.1  NEUTROABS 8.8*  --   HGB 13.0 11.5*  HCT 38.1* 33.4*  MCV 93.8 94.1  PLT 169 144*    Cardiac Enzymes:  Recent Labs Lab 08/22/15 1036 08/22/15 1955 08/23/15 0150  TROPONINI <0.03 <0.03 <0.03    Radiology: Dg Chest 2 View  08/22/2015  CLINICAL DATA:  Near syncope. EXAM: CHEST  2 VIEW COMPARISON:  CT 07/15/2015 new FINDINGS: Sternotomy wires overlie enlarged cardiac silhouette with ectatic aorta. Uette. Large epicardial fat pad again noted. No fusion from infiltrate, pneumothorax. IMPRESSION: No acute cardiopulmonary process.  Stable cardiomegaly. Electronically Signed   By: Suzy Bouchard M.D.   On: 08/22/2015 11:47   Ct Head Wo Contrast  08/22/2015  CLINICAL DATA:  Dizziness with lightheadedness for 4 hours, new onset, unable to ambulate, history benign hypertension, coronary artery disease post CABG and AVR, COPD EXAM: CT HEAD WITHOUT CONTRAST TECHNIQUE: Contiguous axial images were obtained from the base of the skull through the vertex without intravenous contrast. COMPARISON:  None FINDINGS: Minimal atrophy. Normal ventricular morphology. No midline shift or mass effect.  Normal appearance of brain parenchyma. No intracranial hemorrhage, mass lesion or evidence acute infarction. No extra-axial fluid collections. Extensive atherosclerotic calcifications of internal carotid and vertebral arteries at skullbase. Bones and sinuses unremarkable. IMPRESSION: No acute intracranial abnormalities as above. Electronically Signed   By: Lavonia Dana M.D.   On: 08/22/2015 13:38    ECG: Rate controlled atrial fibrillation with nonspecific ST-T changes.   Impression and Recommendations  1. Newly documented atrial fibrillation: He can not tell that his heart rate is irregular, and uncertain as to duration. CHADSVASC score is 5. At this point not entirely clear whether he is markedly symptomatic related to atrial fibrillation in and of itself, however he could be having intermittent pauses or symptomatic bradycardia as a potential etiology.  Will have echocardiogram completed to evaluate for changes in LV fx and valvular status.  He continues on carvedilol 12.5 mg BID. Will discuss anticoagulation therapy with Dr. Domenic Polite.  2. CAD: S/P 4 vessel CABG 2006. Follow up stress test in 2012 negative for ischemia, troponin negative X 3. Does not endorse any anginal symptoms at this time.  3. Dizziness:  Orthostatics have not been completed as yet. He has not had any documented hypotension at baseline. He describes both vertigo symptoms which have been more long-standing, and a more profound episode of lightheadedness and near-syncope that prompted his current admission.   4. Thoracic AA: CT January 2017 demonstrates 4.8 centimeters, stable.   5. S/P Edwards Tissue Valve secondary to aortic regurgitation 2006: Last echo in 2014 demonstrated no significant regurgitation, with no perivalvular leak. Echo is pending.   6. Hypertension: Medications include coreg, amlodipine, and torsemide PRN.   Signed: Phill Myron. Lawrence NP [place]  08/23/2015, 9:14 AM Co-Sign MD   Attending  note:  Patient seen and examined. Extensive records reviewed, chart updated, and above note by Ms. Lawrence NP modified. Mr. Obas is a long-term patient of Dr.Nishan with a history of CAD and aortic regurgitation status post CABG with bioprosthetic AVR in 2006. He has an ascending thoracic aortic aneurysm, recently documented to be stable at 4.8 cm and asymptomatic. In general he has done well, was just seen in the office in January. He is admitted now after a profound episode of lightheadedness and near-syncope. He states that yesterday morning he got up as usual and felt normal, went to brush his teeth, and then while walking into another room suddenly became lightheaded as if he might pass out and had to quickly sit down in a chair. He did not lose consciousness, does not recall any chest pain or shortness of breath, specifically no palpitations. After being seated he felt very nauseated but did not have emesis. He was profoundly weak and not able to get up at that time. EMS was summoned and he was brought in for further evaluation. He states that this is the worst episode he has had. Otherwise he has been experiencing milder episodes of vertigo (room spinning) that are not predictable, sometimes occur while being seated. He has had relative weakness, no exertional chest tightness, reportedly stable dyspnea on exertion. He has trouble with chronic leg edema and uses as needed Demadex. He has not been taking this medication recently and his legs have been swelling more.  Under hospital observation he is noted to be in atrial fibrillation of uncertain duration. Heart rate is generally well controlled, he has had some asymptomatic pauses of 2.0-2.3 seconds by telemetry. He appears comfortable at rest. Systolic blood pressure [id number] to 140s with heart rate in the 80s. Lungs exhibit decreased breath sounds with prolonged expiratory phase and mild end expiratory wheeze. Cardiac exam reveals irregularly irregular  rhythm with soft basal systolic murmur and no gallop. Legs show chronic appearing edema and stasis, 2+. AB were reviewed, creatinine 0.7, troponin I levels negative 3, BNP 234, hemoglobin 11.5, platelets 144, TSH 2.2. Head CT without acute findings. ECG shows rate-controlled atrial fibrillation with nonspecific ST-T changes.  Patient presents after episode of profound dizziness and near syncope as described above. This is on a baseline of other mild symptoms of vertigo. Etiology at this point is not entirely clear. He is noted to be in newly documented, rate controlled atrial fibrillation. It is possible that he has had some symptomatic bradycardia contributing, but the brief pauses by telemetry do not confirm this as yet. Also consider the  possibility of posterior basilar insufficiency or other posterior circulation CNS process. Whether he is truly symptomatic from atrial fibrillation in an of itself is also not clear. CHADSVASC score is 5. Need to proceed with orthostatics to exclude any significant drop in blood pressure. He declines head MRI, his head CT was without acute findings as noted. We will follow-up on the echocardiogram that has been ordered to reassess cardiac structure and function including status of AVR. Question of anticoagulation is certainly to be considered at this point particularly if a cardioversion is ever to be considered, and I will get Dr. Kyla Balzarine opinion since he will be managing the patient long-term. With history of valvular heart disease and bioprosthetic AVR, DOAC would be suboptimal choice most likely. Further recommendations to follow.  Satira Sark, M.D., F.A.C.C.

## 2015-08-23 NOTE — Evaluation (Signed)
Physical Therapy Evaluation Patient Details Name: Terry Wu MRN: [id number] DOB: 12/05/1931 Today's Date: 08/23/2015   History of Present Illness  82yo white male, previously fully indep comes to [facility] after sudden onset of global weakness, nausea s vomitting, and intermittent dizziness. Pt is familiar with episodic vertigo chronically, but denies and vertiginous dizziness yesterday. All symptoms were stable until this morning. Head CT is negative for acute infarct.   Clinical Impression  At evaluation, pt is received semirecumbent in bed upon entry, nurse in room. The pt is awake and agreeable to participate. No acute distress noted at this time, reporting resolution of symptoms since yesterday. The pt is alert and oriented x3, pleasant, conversational, and following simple and multi-step commands consistently. Pt reports zero falls in the last 6 months, however demonstrates multiple LOB throughout session requiring heavy physical assistance for correction and avoidance of fall. Strength assessed is near baseline. Pt received on 2L/min, but moved to room air with noted stable desaturation throughout (91-93%), whereas pt is not on O2 at baseline at home. Digital oppositions is impaired on the dominant side (right) requiring greater effort and focus. Pt demonstrates dysmetria on the R side, distally worse than proximally. Rapid alternating movements are very mildly impaired on R. Lateral eye tracking is absent smooth pursuits to the R, and only minimal smooth pursuits to the Left; no nystagmus seen. Transfers and gait are heavy with ataxia, the patient unable to acquire stability and control during dynamic activities. Light touch sensation is intact. Patient presenting with impairment of fine motor coordination, gross motor coordination, oxygen perfusion, and activity tolerance, limiting ability to perform ADL and mobility tasks at  baseline level of function. Patient will benefit from skilled intervention  to address the above impairments and limitations, in order to restore to prior level of function, improve patient safety upon discharge, and to decrease falls risk. Granted patient's high PLOF and indep and severe acute ataxia in gait, pt is a good candidate for in-patient rehab. In light of both subjective and objective findings collected at eval, the patients clinical presentation is consistent with typical cerebellar infarct presentation, warranting further investigation to rule out, hence I have discussed PT recommendations for neurology to be consulted on this patient. Also recommending OT consult.       Follow Up Recommendations CIR;Supervision for mobility/OOB    Equipment Recommendations  None recommended by PT    Recommendations for Other Services       Precautions / Restrictions Precautions Precautions: Fall Restrictions Weight Bearing Restrictions: No      Mobility  Bed Mobility Overal bed mobility: Modified Independent                Transfers Overall transfer level: Needs assistance Equipment used: 1 person hand held assist Transfers: Sit to/from [facility] Sit to Stand: Min assist Stand pivot transfers: Mod assist       General transfer comment: very unsteady, ataxic.   Ambulation/Gait Ambulation/Gait assistance: Max assist Ambulation Distance (Feet): 3 Feet Assistive device: 1 person hand held assist Gait Pattern/deviations: Ataxic     General Gait Details: severe gait ataxia, AMB defered further until next session. Will rcommend neuro consult.   Stairs            Wheelchair Mobility    Modified Rankin (Stroke Patients Only)       Balance Overall balance assessment: Needs assistance Sitting-balance support: Feet supported;No upper extremity supported Sitting balance-Leahy Scale: Good     Standing balance support:  During functional activity Standing balance-Leahy Scale: Zero Standing balance comment: gait ataxia.                               Pertinent Vitals/Pain Pain Assessment: No/denies pain    Home Living Family/patient expects to be discharged to:: Private residence Living Arrangements: Spouse/significant other;Children Available Help at Discharge: Family;Available PRN/intermittently Type of Home: House Home Access: Stairs to enter Entrance Stairs-Rails: None Entrance Stairs-Number of Steps: 1 Home Layout: Multi-level;Able to live on main level with bedroom/bathroom (has been sleeping in a recliner for the past several months due to apnea issues. ) Home Equipment: Walker - standard      Prior Function Level of Independence: Independent         Comments: poor tolerance of prolonged ambulation, does better with shopping buggy for stability.      Hand Dominance   Dominant Hand: Right    Extremity/Trunk Assessment   Upper Extremity Assessment: RUE deficits/detail RUE Deficits / Details: Grip strength sensation intact; mildly impaired digital opposition, mildly impaired rapid alternating movements, mod-severe impairment in dysmetria distal worse than proximal.          Lower Extremity Assessment:  (Gait ataxia in transfers and stance, pt denies weakness, but is unable to balance self. )         Communication   Communication: No difficulties  Cognition Arousal/Alertness: Awake/alert Behavior During Therapy: WFL for tasks assessed/performed Overall Cognitive Status: Within Functional Limits for tasks assessed                      General Comments      Exercises        Assessment/Plan    PT Assessment Patient needs continued PT services  PT Diagnosis Difficulty walking;Abnormality of gait;Other (comment) (RUE ataxia)   PT Problem List Decreased strength;Decreased activity tolerance;Decreased balance;Decreased mobility;Decreased coordination;Decreased knowledge of use of DME;Decreased knowledge of precautions;Obesity  PT Treatment Interventions  DME instruction;Gait training;Balance training;Neuromuscular re-education;Cognitive remediation;Stair training;Functional mobility training;Patient/family education;Therapeutic exercise;Therapeutic activities   PT Goals (Current goals can be found in the Care Plan section) Acute Rehab PT Goals Patient Stated Goal: regain functional indep. PT Goal Formulation: With patient Time For Goal Achievement: 09/06/15 Potential to Achieve Goals: Good    Frequency 7X/week   Barriers to discharge Inaccessible home environment;Decreased caregiver support      Co-evaluation               End of Session Equipment Utilized During Treatment: Gait belt Activity Tolerance: Patient tolerated treatment well Patient left: in chair;with call bell/phone within reach Nurse Communication: Other (comment) (orthostatic vitals, suspected infarct and recs for neuro consult. )    Functional Limitation: Mobility: Walking and moving around Mobility: Walking and Moving Around Current Status [id number]): At least 80 percent but less than 100 percent impaired, limited or restricted Mobility: Walking and Moving Around Goal Status [phone]): At least 60 percent but less than 80 percent impaired, limited or restricted    Time: 1026-1057 PT Time Calculation (min) (ACUTE ONLY): 31 min   Charges:   PT Evaluation $PT Eval Moderate Complexity: 1 Procedure PT Treatments $Therapeutic Activity: 8-22 mins   PT G Codes:   PT G-Codes **NOT FOR INPATIENT CLASS** Functional Limitation: Mobility: Walking and moving around Mobility: Walking and Moving Around Current Status [id number]): At least 80 percent but less than 100 percent impaired, limited or restricted Mobility: Walking and Moving Around Goal Status (  [id number]): At least 60 percent but less than 80 percent impaired, limited or restricted    11:39 AM, 08/23/2015 Etta Grandchild, PT, DPT PRN Physical Therapist at [facility] License # [id number] (wireless)   [phone] (mobile)

## 2015-08-23 NOTE — Care Management Obs Status (Signed)
[facility] NOTIFICATION   Patient Details  Name: Terry Wu MRN: [id number] Date of Birth: 01-30-1932   Medicare Observation Status Notification Given:  Yes    Sherald Barge, RN 08/23/2015, 3:24 PM

## 2015-08-24 DIAGNOSIS — I712 Thoracic aortic aneurysm, without rupture: Secondary | ICD-10-CM | POA: Diagnosis present

## 2015-08-24 DIAGNOSIS — E669 Obesity, unspecified: Secondary | ICD-10-CM | POA: Diagnosis present

## 2015-08-24 DIAGNOSIS — Z951 Presence of aortocoronary bypass graft: Secondary | ICD-10-CM | POA: Diagnosis not present

## 2015-08-24 DIAGNOSIS — Z952 Presence of prosthetic heart valve: Secondary | ICD-10-CM | POA: Diagnosis not present

## 2015-08-24 DIAGNOSIS — Z6836 Body mass index (BMI) 36.0-36.9, adult: Secondary | ICD-10-CM | POA: Diagnosis not present

## 2015-08-24 DIAGNOSIS — I110 Hypertensive heart disease with heart failure: Secondary | ICD-10-CM | POA: Diagnosis not present

## 2015-08-24 DIAGNOSIS — I4891 Unspecified atrial fibrillation: Principal | ICD-10-CM

## 2015-08-24 DIAGNOSIS — R600 Localized edema: Secondary | ICD-10-CM

## 2015-08-24 DIAGNOSIS — I10 Essential (primary) hypertension: Secondary | ICD-10-CM | POA: Diagnosis present

## 2015-08-24 DIAGNOSIS — Z954 Presence of other heart-valve replacement: Secondary | ICD-10-CM | POA: Diagnosis not present

## 2015-08-24 DIAGNOSIS — J449 Chronic obstructive pulmonary disease, unspecified: Secondary | ICD-10-CM | POA: Diagnosis present

## 2015-08-24 DIAGNOSIS — I2510 Atherosclerotic heart disease of native coronary artery without angina pectoris: Secondary | ICD-10-CM | POA: Diagnosis not present

## 2015-08-24 DIAGNOSIS — M1990 Unspecified osteoarthritis, unspecified site: Secondary | ICD-10-CM | POA: Diagnosis present

## 2015-08-24 DIAGNOSIS — Z7902 Long term (current) use of antithrombotics/antiplatelets: Secondary | ICD-10-CM | POA: Diagnosis not present

## 2015-08-24 DIAGNOSIS — E119 Type 2 diabetes mellitus without complications: Secondary | ICD-10-CM | POA: Diagnosis present

## 2015-08-24 DIAGNOSIS — R42 Dizziness and giddiness: Secondary | ICD-10-CM | POA: Diagnosis not present

## 2015-08-24 DIAGNOSIS — Z886 Allergy status to analgesic agent status: Secondary | ICD-10-CM | POA: Diagnosis not present

## 2015-08-24 DIAGNOSIS — E782 Mixed hyperlipidemia: Secondary | ICD-10-CM | POA: Diagnosis present

## 2015-08-24 DIAGNOSIS — R269 Unspecified abnormalities of gait and mobility: Secondary | ICD-10-CM

## 2015-08-24 DIAGNOSIS — K219 Gastro-esophageal reflux disease without esophagitis: Secondary | ICD-10-CM | POA: Diagnosis present

## 2015-08-24 DIAGNOSIS — I272 Other secondary pulmonary hypertension: Secondary | ICD-10-CM | POA: Diagnosis present

## 2015-08-24 LAB — PROTIME-INR
INR: 1.25 (ref 0.00–1.49)
Prothrombin Time: 15.8 seconds — ABNORMAL HIGH (ref 11.6–15.2)

## 2015-08-24 LAB — BASIC METABOLIC PANEL
ANION GAP: 8 (ref 5–15)
BUN: 17 mg/dL (ref 6–20)
CALCIUM: 8.6 mg/dL — AB (ref 8.9–10.3)
CO2: 30 mmol/L (ref 22–32)
Chloride: 102 mmol/L (ref 101–111)
Creatinine, Ser: 0.83 mg/dL (ref 0.61–1.24)
GFR calc Af Amer: >60 mL/min (ref >60)
GLUCOSE: 94 mg/dL (ref 65–99)
Potassium: 3.9 mmol/L (ref 3.5–5.1)
SODIUM: 140 mmol/L (ref 135–145)

## 2015-08-24 LAB — GLUCOSE, CAPILLARY
GLUCOSE-CAPILLARY: 82 mg/dL (ref 65–99)
GLUCOSE-CAPILLARY: 114 mg/dL — AB (ref 65–99)
Glucose-Capillary: 162 mg/dL — ABNORMAL HIGH (ref 65–99)
Glucose-Capillary: 92 mg/dL (ref 65–99)

## 2015-08-24 LAB — HEMOGLOBIN A1C
HEMOGLOBIN A1C: 6.3 % — AB (ref 4.8–5.6)
MEAN PLASMA GLUCOSE: 134 mg/dL

## 2015-08-24 MED ORDER — WARFARIN VIDEO
Freq: Once | Status: AC
  Administered 2015-08-24: 20:00:00

## 2015-08-24 MED ORDER — WARFARIN - PHARMACIST DOSING INPATIENT
Status: DC
  Administered 2015-08-24: 16:00:00

## 2015-08-24 MED ORDER — WARFARIN SODIUM 5 MG PO TABS
5.0000 mg | ORAL_TABLET | Freq: Once | ORAL | Status: AC
  Administered 2015-08-24: 5 mg via ORAL
  Filled 2015-08-24: qty 1

## 2015-08-24 MED ORDER — PATIENT'S GUIDE TO USING COUMADIN BOOK
Freq: Once | Status: AC
Start: 2015-08-24 — End: 2015-08-24
  Administered 2015-08-24: 16:00:00
  Filled 2015-08-24: qty 1

## 2015-08-24 MED ORDER — FUROSEMIDE 10 MG/ML IJ SOLN
40.0000 mg | Freq: Two times a day (BID) | INTRAMUSCULAR | Status: DC
Start: 2015-08-24 — End: 2015-08-25
  Administered 2015-08-24 – 2015-08-25 (×3): 40 mg via INTRAVENOUS
  Filled 2015-08-24 (×3): qty 4

## 2015-08-24 NOTE — Discharge Instructions (Signed)

## 2015-08-24 NOTE — Consult Note (Signed)
[place] A. Merlene Laughter, MD     [email or web address]          Terry Wu is an 82 y.o. male.   ASSESSMENT/PLAN: 1. Acute gait impairment associated with dizziness. The etiology is most likely due to acute onset of atrial fibrillation. The patient's examination and imaging argues against an acute infarct that explanation. I agree with anticoagulation for this patient for stroke prevention.  The patient presented with acute onset of gait impairment and dizziness. Dizziness is described as lightheaded sensation. The patient did not have specific episodes of chest pain, shortness of breath or dyspnea although he reports that he has had episodes of palpitations in the past. The patient does not report having focal numbness or weakness. The patient does not report dysarthria or dysphasia. The review of systems otherwise negative. He reports that his symptoms have improved significantly. The patient does have a history of edema of the legs which apparently got worse during this more recent episode of dizziness. He has been ambulating in the halls and up the stairs. The workup has been significant for acute onset atrial fibrillation.  GENERAL: This is a pleasant obese man in no acute distress.  HEENT: Supple. Atraumatic normocephalic.   ABDOMEN: soft  EXTREMITIES: There is 2+ pitting edema of the feet and legs. This involves about two thirds up the legs. There is marked arthritic changes of the knees. Status post bilateral total knee arthroplasty.  BACK: Normal.  SKIN: Normal by inspection.    MENTAL STATUS: Alert and oriented. Speech, language and cognition are generally intact. Judgment and insight normal.   CRANIAL NERVES: Pupils are equal, round and reactive to light and accommodation; extra ocular movements are full, there is no significant nystagmus; visual fields are full; upper and lower facial muscles are normal in strength and symmetric, there is no flattening of  the nasolabial folds; tongue is midline; uvula is midline; shoulder elevation is normal.  MOTOR: Normal tone, bulk and strength; no pronator drift.  COORDINATION: Left finger to nose is normal, right finger to nose is normal, No rest tremor; no intention tremor; no postural tremor; no bradykinesia.  REFLEXES: Deep tendon reflexes are symmetrical and normal - except at the knees where there is trace.   SENSATION: Normal to light touch.     Blood pressure 129/82, pulse 72, temperature 98.5 F (36.9 C), temperature source Oral, resp. rate 20, height '5\' 9"'$  (1.753 m), weight 110.61 kg (243 lb 13.6 oz), SpO2 94 %.  Past Medical History  Diagnosis Date  . Mixed hyperlipidemia   . Essential hypertension   . Esophageal reflux   . CAD (coronary artery disease)     Multivessel status post CABG 2006  . Aortic regurgitation     Bioprosthetic AVR 2006  . Type 2 diabetes mellitus ([place])   . Aortic aneurysm ([facility])     Ascending thoracic - 4.8 cm  . History of dizziness     Past Surgical History  Procedure Laterality Date  . Coronary artery bypass graft  2006    LIMA to LAD, SVG to diagonal, SVG to OM, SVG to PDA   . Aortic valve replacement  2006    [place] Pericardial tissue valve  . Total knee arthroplasty Bilateral   . Hernia repair      Family History  Problem Relation Age of Onset  . Cancer    . Heart Problems Mother   . Stroke Father     Social History:  reports that  he has never smoked. He does not have any smokeless tobacco history on file. He reports that he drinks alcohol. He reports that he does not use illicit drugs.  Allergies:  Allergies  Allergen Reactions  . Aspirin     Unknown, per pt  . Demerol     Unknown, per pt    Medications: Prior to Admission medications   Medication Sig Start Date End Date Taking? Authorizing Provider  amLODipine (NORVASC) 10 MG tablet TAKE ONE TABLET BY MOUTH ONCE DAILY 05/18/15  Yes Josue Hector, MD  amoxicillin (AMOXIL) 500  MG capsule Take 2,000 mg by mouth as directed.    Yes Historical Provider, MD  atorvastatin (LIPITOR) 20 MG tablet Take 1 tablet (20 mg total) by mouth daily. 07/15/15  Yes Josue Hector, MD  carvedilol (COREG) 12.5 MG tablet Take 1 tablet (12.5 mg total) by mouth 2 (two) times daily with a meal. 07/29/15  Yes Josue Hector, MD  clopidogrel (PLAVIX) 75 MG tablet Take 1 tablet (75 mg total) by mouth daily. 07/15/15  Yes Josue Hector, MD  Coenzyme Q10 (CO Q 10) 100 MG CAPS Take 1 capsule by mouth every morning.   Yes Historical Provider, MD  glimepiride (AMARYL) 2 MG tablet Take 2 mg by mouth daily before breakfast.  02/16/13  Yes Sinda Du, MD  latanoprost (XALATAN) 0.005 % ophthalmic solution Place 1 drop into both eyes at bedtime.   Yes Historical Provider, MD  magnesium oxide (MAG-OX) 400 MG tablet Take 400 mg by mouth daily.   Yes Historical Provider, MD  Multiple Vitamins-Minerals (ICAPS PO) Take 2 tablets by mouth 3 (three) times daily after meals.    Yes Historical Provider, MD  Naproxen Sodium (ALEVE) 220 MG CAPS Take 220 mg by mouth 2 (two) times daily.   Yes Historical Provider, MD  pantoprazole (PROTONIX) 40 MG tablet Take 1 tablet (40 mg total) by mouth daily. 07/15/15  Yes Josue Hector, MD  potassium chloride SA (K-DUR,KLOR-CON) 20 MEQ tablet Take 20 mEq by mouth 2 (two) times daily.   Yes Historical Provider, MD  telmisartan (MICARDIS) 40 MG tablet Take 40 mg by mouth daily. 02/16/13  Yes Sinda Du, MD  torsemide (DEMADEX) 100 MG tablet Take 100 mg by mouth daily.   Yes Historical Provider, MD  triamterene-hydrochlorothiazide (MAXZIDE-25) 37.5-25 MG per tablet Take 1 tablet by mouth daily.   Yes Historical Provider, MD    Scheduled Meds: . amLODipine  10 mg Oral Daily  . atorvastatin  20 mg Oral Daily  . carvedilol  12.5 mg Oral BID WC  . enoxaparin (LOVENOX) injection  40 mg Subcutaneous Q24H  . furosemide  40 mg Intravenous Q12H  . guaiFENesin  1,200 mg Oral BID  .  insulin aspart  0-15 Units Subcutaneous TID WC  . insulin aspart  0-5 Units Subcutaneous QHS  . ipratropium-albuterol  3 mL Nebulization TID  . irbesartan  150 mg Oral Daily  . latanoprost  1 drop Both Eyes QHS  . magnesium oxide  400 mg Oral Daily  . pantoprazole  40 mg Oral Daily  . potassium chloride SA  20 mEq Oral BID  . sodium chloride flush  3 mL Intravenous Q12H  . sodium chloride flush  3 mL Intravenous Q12H  . warfarin   Does not apply Once  . Warfarin - Pharmacist Dosing Inpatient   Does not apply Q24H   Continuous Infusions:  PRN Meds:.sodium chloride, acetaminophen **OR** acetaminophen, ondansetron **OR** ondansetron (ZOFRAN)  IV, sodium chloride flush     Results for orders placed or performed during the hospital encounter of 08/22/15 (from the past 48 hour(s))  Troponin I     Status: None   Collection Time: 08/22/15  7:55 PM  Result Value Ref Range   Troponin I <0.03 <0.031 ng/mL    Comment:        NO INDICATION OF MYOCARDIAL INJURY.   Comprehensive metabolic panel     Status: Abnormal   Collection Time: 08/23/15  1:50 AM  Result Value Ref Range   Sodium 138 135 - 145 mmol/L   Potassium 4.0 3.5 - 5.1 mmol/L   Chloride 103 101 - 111 mmol/L   CO2 27 22 - 32 mmol/L   Glucose, Bld 149 (H) 65 - 99 mg/dL   BUN 11 6 - 20 mg/dL   Creatinine, Ser 7.28 0.61 - 1.24 mg/dL   Calcium 8.6 (L) 8.9 - 10.3 mg/dL   Total Protein 6.2 (L) 6.5 - 8.1 g/dL   Albumin 3.5 3.5 - 5.0 g/dL   AST 15 15 - 41 U/L   ALT 15 (L) 17 - 63 U/L   Alkaline Phosphatase 26 (L) 38 - 126 U/L   Total Bilirubin 0.6 0.3 - 1.2 mg/dL   GFR calc non Af Amer >60 >60 mL/min   GFR calc Af Amer >60 >60 mL/min    Comment: (NOTE) The eGFR has been calculated using the CKD EPI equation. This calculation has not been validated in all clinical situations. eGFR's persistently <60 mL/min signify possible Chronic Kidney Disease.    Anion gap 8 5 - 15  CBC     Status: Abnormal   Collection Time: 08/23/15  1:50  AM  Result Value Ref Range   WBC 9.1 4.0 - 10.5 K/uL   RBC 3.55 (L) 4.22 - 5.81 MIL/uL   Hemoglobin 11.5 (L) 13.0 - 17.0 g/dL   HCT 88.9 (L) 90.8 - 59.1 %   MCV 94.1 78.0 - 100.0 fL   MCH 32.4 26.0 - 34.0 pg   MCHC 34.4 30.0 - 36.0 g/dL   RDW 17.7 01.9 - 39.6 %   Platelets 144 (L) 150 - 400 K/uL  Troponin I     Status: None   Collection Time: 08/23/15  1:50 AM  Result Value Ref Range   Troponin I <0.03 <0.031 ng/mL    Comment:        NO INDICATION OF MYOCARDIAL INJURY.   Hemoglobin A1c     Status: Abnormal   Collection Time: 08/23/15  1:50 AM  Result Value Ref Range   Hgb A1c MFr Bld 6.3 (H) 4.8 - 5.6 %    Comment: (NOTE)         Pre-diabetes: 5.7 - 6.4         Diabetes: >6.4         Glycemic control for adults with diabetes: <7.0    Mean Plasma Glucose 134 mg/dL    Comment: (NOTE) Performed At: [facility] [address] [place], [place] [id number] Mila Homer MD DC:[id number]   Urinalysis, Routine w reflex microscopic (not at [facility])     Status: None   Collection Time: 08/23/15  6:35 AM  Result Value Ref Range   Color, Urine YELLOW YELLOW   APPearance CLEAR CLEAR   Specific Gravity, Urine 1.015 1.005 - 1.030   pH 6.5 5.0 - 8.0   Glucose, UA NEGATIVE NEGATIVE mg/dL   Hgb urine dipstick NEGATIVE NEGATIVE   Bilirubin  Urine NEGATIVE NEGATIVE   Ketones, ur NEGATIVE NEGATIVE mg/dL   Protein, ur NEGATIVE NEGATIVE mg/dL   Nitrite NEGATIVE NEGATIVE   Leukocytes, UA NEGATIVE NEGATIVE    Comment: MICROSCOPIC NOT DONE ON URINES WITH NEGATIVE PROTEIN, BLOOD, LEUKOCYTES, NITRITE, OR GLUCOSE <1000 mg/dL.  Troponin I     Status: None   Collection Time: 08/23/15  8:24 AM  Result Value Ref Range   Troponin I <0.03 <0.031 ng/mL    Comment:        NO INDICATION OF MYOCARDIAL INJURY.   Glucose, capillary     Status: Abnormal   Collection Time: 08/23/15 11:07 AM  Result Value Ref Range   Glucose-Capillary 149 (H) 65 - 99 mg/dL  Glucose, capillary     Status: None     Collection Time: 08/23/15  4:18 PM  Result Value Ref Range   Glucose-Capillary 88 65 - 99 mg/dL   Comment 1 Notify RN    Comment 2 Document in Chart   Glucose, capillary     Status: None   Collection Time: 08/23/15  9:23 PM  Result Value Ref Range   Glucose-Capillary 87 65 - 99 mg/dL   Comment 1 Notify RN    Comment 2 Document in Chart   Basic metabolic panel     Status: Abnormal   Collection Time: 08/24/15  5:44 AM  Result Value Ref Range   Sodium 140 135 - 145 mmol/L   Potassium 3.9 3.5 - 5.1 mmol/L   Chloride 102 101 - 111 mmol/L   CO2 30 22 - 32 mmol/L   Glucose, Bld 94 65 - 99 mg/dL   BUN 17 6 - 20 mg/dL   Creatinine, Ser 0.83 0.61 - 1.24 mg/dL   Calcium 8.6 (L) 8.9 - 10.3 mg/dL   GFR calc non Af Amer >60 >60 mL/min   GFR calc Af Amer >60 >60 mL/min    Comment: (NOTE) The eGFR has been calculated using the CKD EPI equation. This calculation has not been validated in all clinical situations. eGFR's persistently <60 mL/min signify possible Chronic Kidney Disease.    Anion gap 8 5 - 15  Glucose, capillary     Status: None   Collection Time: 08/24/15  7:25 AM  Result Value Ref Range   Glucose-Capillary 82 65 - 99 mg/dL   Comment 1 Notify RN   Protime-INR     Status: Abnormal   Collection Time: 08/24/15 10:25 AM  Result Value Ref Range   Prothrombin Time 15.8 (H) 11.6 - 15.2 seconds   INR 1.25 0.00 - 1.49  Glucose, capillary     Status: Abnormal   Collection Time: 08/24/15 11:20 AM  Result Value Ref Range   Glucose-Capillary 162 (H) 65 - 99 mg/dL   Comment 1 Notify RN   Glucose, capillary     Status: None   Collection Time: 08/24/15  4:32 PM  Result Value Ref Range   Glucose-Capillary 92 65 - 99 mg/dL   Comment 1 Notify RN     Studies/Results:  HEAD CT FINDINGS: Minimal atrophy.  Normal ventricular morphology.  No midline shift or mass effect.  Normal appearance of brain parenchyma.  No intracranial hemorrhage, mass lesion or evidence  acute infarction.  No extra-axial fluid collections.  Extensive atherosclerotic calcifications of internal carotid and vertebral arteries at skullbase.  Bones and sinuses unremarkable.  IMPRESSION: No acute intracranial abnormalities as above.       Rees Matura A. Merlene Laughter, M.D.  Diplomate, Tax adviser of Psychiatry and  Neurology ( Neurology). 08/24/2015, 6:24 PM

## 2015-08-24 NOTE — Progress Notes (Addendum)
ANTICOAGULATION CONSULT NOTE - Initial Consult  Pharmacy Consult for Coumadin Indication: atrial fibrillation  Allergies  Allergen Reactions  . Aspirin     Unknown, per pt  . Demerol     Unknown, per pt   Patient Measurements: Height: 5\' 9"  (175.3 cm) Weight: 243 lb 13.6 oz (110.61 kg) IBW/kg (Calculated) : 70.7  Vital Signs: Temp: 98.6 F (37 C) (03/01 0509) Temp Source: Oral (03/01 0509) BP: 134/71 mmHg (03/01 0509) Pulse Rate: 80 (03/01 0509)  Labs:  Recent Labs  08/22/15 1036 08/22/15 1955 08/23/15 0150 08/23/15 0824 08/24/15 0544 08/24/15 1025  HGB 13.0  --  11.5*  --   --   --   HCT 38.1*  --  33.4*  --   --   --   PLT 169  --  144*  --   --   --   LABPROT  --   --   --   --   --  15.8*  INR  --   --   --   --   --  1.25  CREATININE 0.75  --  0.67  --  0.83  --   TROPONINI <0.03 <0.03 <0.03 <0.03  --   --    Estimated Creatinine Clearance: 85.6 mL/min (by C-G formula based on Cr of 0.83).  Medical History: Past Medical History  Diagnosis Date  . Mixed hyperlipidemia   . Essential hypertension   . Esophageal reflux   . CAD (coronary artery disease)     Multivessel status post CABG 2006  . Aortic regurgitation     Bioprosthetic AVR 2006  . Type 2 diabetes mellitus ([place])   . Aortic aneurysm (HCC)     Ascending thoracic - 4.8 cm  . History of dizziness    Medications:  Prescriptions prior to admission  Medication Sig Dispense Refill Last Dose  . amLODipine (NORVASC) 10 MG tablet TAKE ONE TABLET BY MOUTH ONCE DAILY 30 tablet 10 08/21/2015 at Unknown time  . amoxicillin (AMOXIL) 500 MG capsule Take 2,000 mg by mouth as directed.    08/21/2015 at Unknown time  . atorvastatin (LIPITOR) 20 MG tablet Take 1 tablet (20 mg total) by mouth daily. 90 tablet 3 08/21/2015 at Unknown time  . carvedilol (COREG) 12.5 MG tablet Take 1 tablet (12.5 mg total) by mouth 2 (two) times daily with a meal. 180 tablet 3 08/21/2015 at 0730  . clopidogrel (PLAVIX) 75 MG tablet  Take 1 tablet (75 mg total) by mouth daily. 90 tablet 3 08/21/2015 at 0900  . Coenzyme Q10 (CO Q 10) 100 MG CAPS Take 1 capsule by mouth every morning.   08/21/2015 at Unknown time  . glimepiride (AMARYL) 2 MG tablet Take 2 mg by mouth daily before breakfast.    08/21/2015 at Unknown time  . latanoprost (XALATAN) 0.005 % ophthalmic solution Place 1 drop into both eyes at bedtime.   08/21/2015 at Unknown time  . magnesium oxide (MAG-OX) 400 MG tablet Take 400 mg by mouth daily.   08/21/2015 at Unknown time  . Multiple Vitamins-Minerals (ICAPS PO) Take 2 tablets by mouth 3 (three) times daily after meals.    08/21/2015 at Unknown time  . Naproxen Sodium (ALEVE) 220 MG CAPS Take 220 mg by mouth 2 (two) times daily.   08/21/2015 at Unknown time  . pantoprazole (PROTONIX) 40 MG tablet Take 1 tablet (40 mg total) by mouth daily. 90 tablet 3 08/21/2015 at Unknown time  . potassium chloride SA (K-DUR,KLOR-CON) 20  MEQ tablet Take 20 mEq by mouth 2 (two) times daily.   08/21/2015 at Unknown time  . telmisartan (MICARDIS) 40 MG tablet Take 40 mg by mouth daily.   08/21/2015 at Unknown time  . torsemide (DEMADEX) 100 MG tablet Take 100 mg by mouth daily.   08/21/2015 at Unknown time  . triamterene-hydrochlorothiazide (MAXZIDE-25) 37.5-25 MG per tablet Take 1 tablet by mouth daily.   08/21/2015 at Unknown time   Summary: He says he feels a little bit better. He is still somewhat weak and short of breath. He still has swelling of his legs. No chest pain. He is aware of his atrial fibrillation. He had 2.18 second pause on telemetry that was asymptomatic.  Pharmacy asked to initiate and manage Warfarin for afib. Will continue Lovenox for DVT px until INR at goal.  Goal of Therapy:  INR 2-3 Monitor platelets by anticoagulation protocol: Yes   Plan:  Coumadin 5mg  today x 1 Continue Lovenox until INR at goal INR daily Provide Coumadin education / handouts Monitor for s/sx of bleeding complications  Hart Robinsons  A 08/24/2015,11:16 AM

## 2015-08-24 NOTE — Care Management Note (Signed)
Case Management Note  Patient Details  Name: BODI LUMBARD MRN: [id number] Date of Birth: [birth year]   Expected Discharge Date:  08/23/15               Expected Discharge Plan:  Home/Self Care  In-House Referral:  NA  Discharge planning Services  CM Consult  Post Acute Care Choice:    Choice offered to:     DME Arranged:    DME Agency:     HH Arranged:    HH Agency:     Status of Service:  In process, will continue to follow  Medicare Important Message Given:    Date Medicare IM Given:    Medicare IM give by:    Date Additional Medicare IM Given:    Additional Medicare Important Message give by:     If discussed at [place] of Stay Meetings, dates discussed:    Additional Comments: Pt has improved and PT is not recommended HH vs OP PT. Pt has refused HH and prefers [facility]. Pt's wife at bedside. Pt explained OP center will contact him after DC to set up appointments. PT also recommends walker, pt states he has a walker at home to use. Pt plans to return home with self care in next 24 hrs.   Sherald Barge, RN 08/24/2015, 1:50 PM

## 2015-08-24 NOTE — Progress Notes (Signed)
Physical Therapy Treatment Patient Details Name: Terry Wu MRN: [id number] DOB: [birth year] Today's Date: 08/24/2015    History of Present Illness 82yo white male, previously fully indep comes to [facility] after sudden onset of global weakness, nausea s vomitting, and intermittent dizziness. Pt is familiar with episodic vertigo chronically, but denies and vertiginous dizziness yesterday. All symptoms were stable until this morning.     PT Comments    OT spoke to PT, reporting that patient had improved greatly since last time he was seen by skilled PT services, and that he may be appropriate for re-assessment by PT to ensure DC to appropriate facility. Patient found sitting upright in chair with family members in room; performed strength testing, gait, stair training, and balance testing with patient today. Did find that patient is able to perform but somewhat fatigued on full flight of stairs and does have significant difficulty with balance, especially with SLS and tandem stance, however he has improved significantly since the last time he was seen by skilled PT services and is appropriate for HHPT at this time. Spoke to family, who seemed to question HHPT as they have had poor experience with it in the past; explained PT reasoning for recommendation and that patient does remain a fall risk, explained need for skilled services to further address functional limitations in order to prevent injury to patient or family at home, and explained that if they choose to refuse HHPT patient will need to be seen at outpatient at bare minimum to reduce fall risk and improve safety. Patient's wife also presented PT with significant concerns regarding his change to observation status; gave very general information regarding observation status and explained that social worker/nursing would be able to best answer her questions/address concerns. Also requested that family stop escorting patient to restroom and let trained  clinical staff perform this task for safety. Patient left sitting upright in chair with family present, all needs otherwise met; stopped and reported change in discharge recommendation to social worker and also requested that social worker please stop by room and speak to family as soon as possible today.   Follow Up Recommendations  Home health PT;Outpatient PT (HHPT preferable; if family/patient refuse, needs OP PT at bare minimum )     Equipment Recommendations  Standard walker    Recommendations for Other Services       Precautions / Restrictions Precautions Precautions: Fall Restrictions Weight Bearing Restrictions: No    Mobility  Bed Mobility               General bed mobility comments: DNT   Transfers Overall transfer level: Needs assistance Equipment used: None Transfers: Sit to/from Stand Sit to Stand: Supervision         General transfer comment: patient has improved quite a bit since previous treatment session, able to complete all functional transfers with supervision today and no unsteadiness or concern for LOB   Ambulation/Gait Ambulation/Gait assistance: Min guard Ambulation Distance (Feet): 100 Feet Assistive device: None Gait Pattern/deviations: Step-through pattern;Decreased step length - right;Decreased step length - left;Decreased dorsiflexion - right;Decreased dorsiflexion - left;Trunk flexed Gait velocity: appropriate for situation    General Gait Details: gait has improved to be generally WFL, great improvement since last session; does fatigue with extended gait    Stairs            Wheelchair Mobility    Modified Rankin (Stroke Patients Only)       Balance Overall balance assessment: Needs assistance Sitting-balance support:  No upper extremity supported Sitting balance-Leahy Scale: Good     Standing balance support: No upper extremity supported Standing balance-Leahy Scale: Fair   Single Leg Stance - Right Leg: 1 Single  Leg Stance - Left Leg: 1 Tandem Stance - Right Leg: 5 Tandem Stance - Left Leg: 4       High Level Balance Comments: difficulty with balance tasks involving narrow BOS or SLS     Cognition Arousal/Alertness: Awake/alert Behavior During Therapy: WFL for tasks assessed/performed Overall Cognitive Status: Within Functional Limits for tasks assessed                      Exercises      General Comments General comments (skin integrity, edema, etc.): patient very pleasant and has improved greatly since he was last seen by skilled PT services       Pertinent Vitals/Pain Pain Assessment: No/denies pain    Home Living Family/patient expects to be discharged to:: Private residence Living Arrangements: Spouse/significant other;Children Available Help at Discharge: Family;Available PRN/intermittently       Home Layout: Multi-level;Able to live on main level with bedroom/bathroom (sleeps in recliner due to arthritis pain) Home Equipment: Walker - standard      Prior Function Level of Independence: Independent      Comments: poor tolerance of prolonged ambulation, does better with shopping buggy for stability.    PT Goals (current goals can now be found in the care plan section) Acute Rehab PT Goals Patient Stated Goal: regain functional indep. PT Goal Formulation: With patient Time For Goal Achievement: 09/06/15 Potential to Achieve Goals: Good Progress towards PT goals: Goals met and updated - see care plan    Frequency  7X/week    PT Plan Discharge plan needs to be updated    Co-evaluation             End of Session Equipment Utilized During Treatment: Gait belt Activity Tolerance: Patient tolerated treatment well Patient left: in chair;with call bell/phone within reach;with family/visitor present     Time: 1002-1035 PT Time Calculation (min) (ACUTE ONLY): 33 min  Charges:  $Therapeutic Activity: 23-37 mins                    G Codes:  Functional  Assessment Tool Used: Based on skilled clinical assessment of gait, strength, balance, funtional mobility and transfers Functional Limitation: Mobility: Walking and moving around Mobility: Walking and Moving Around Current Status ([id number]): At least 40 percent but less than 60 percent impaired, limited or restricted Mobility: Walking and Moving Around Goal Status [phone]): At least 20 percent but less than 40 percent impaired, limited or restricted   Deniece Ree PT, DPT [phone]

## 2015-08-24 NOTE — Progress Notes (Signed)
Consulting cardiologist: Dr. Satira Sark  Subjective:  No further dizziness.  Objective:  Vital Signs in the last 24 hours: Temp:  [97.5 F (36.4 C)-98.6 F (37 C)] 98.6 F (37 C) (03/01 0509) Pulse Rate:  [65-81] 80 (03/01 0509) Resp:  [20] 20 (03/01 0509) BP: (124-138)/(71-81) 134/71 mmHg (03/01 0509) SpO2:  [93 %-95 %] 94 % (03/01 0747)  Intake/Output from previous day: 02/28 0701 - 03/01 0700 In: 1200 [P.O.:1200] Out: 1550 [Urine:1550]  Physical Exam: NECK: Without JVD, HJR, or bruit LUNGS: Decreased breath sounds with bibasilar crackles HEART: Irregular rate and rhythm, [id number] systolic murmur LSB,no gallop, rub, bruit, thrill, or heave EXTREMITIES: chronic scaling edema 1-2 plus  Lab Results:  Recent Labs  08/22/15 1036 08/23/15 0150  WBC 11.3* 9.1  HGB 13.0 11.5*  PLT 169 144*    Recent Labs  08/23/15 0150 08/24/15 0544  NA 138 140  K 4.0 3.9  CL 103 102  CO2 27 30  GLUCOSE 149* 94  BUN 11 17  CREATININE 0.67 0.83    Recent Labs  08/23/15 0150 08/23/15 0824  TROPONINI <0.03 <0.03   Hepatic Function Panel  Recent Labs  08/23/15 0150  PROT 6.2*  ALBUMIN 3.5  AST 15  ALT 15*  ALKPHOS 26*  BILITOT 0.6   Cardiac Studies:  2-D echo 08/23/15 Study Conclusions  - Left ventricle: The cavity size was normal. Wall thickness was   increased in a pattern of mild LVH. Systolic function was normal.   The estimated ejection fraction was in the range of 60% to 65%.   Wall motion was normal; there were no regional wall motion   abnormalities. - Aortic valve: 25 mm Edwards pericardial Magna prosthesis in   aortic position. No obvious perivalvular leak. Leaflet motion is   normal. There was no significant regurgitation. Mean gradient   (S): 10 mm Hg. Peak gradient (S): 17 mm Hg. VTI ratio of LVOT to   aortic valve: 0.61. Valve area (Vmax): 1.83 cm^2. Valve area   (Vmean): 1.63 cm^2. - Aortic root: The aortic root was mildly to  moderately dilated. - Mitral valve: Calcified annulus. Mildly thickened leaflets .   There was trivial regurgitation. - Left atrium: The atrium was moderately to severely dilated. - Right atrium: The atrium was moderately to severely dilated.   Central venous pressure (est): 8 mm Hg. - Tricuspid valve: There was trivial regurgitation. - Pulmonary arteries: Systolic pressure was moderately increased.   PA peak pressure: 51 mm Hg (S). - Pericardium, extracardiac: There was no pericardial effusion.  Impressions:  - Mild LVH with LVEF 60-65%, indeterminate diastolic function in   the setting of atrial fibrillation. Moderate to severe biatrial   enlargement. MAC with mildly thickened mitral leaflets and   trivial mitral regurgitation. Bioprosthetic aortic valve shows   grossly normal function as detailed above. Trivial tricuspid   regurgitation with evidence of moderate pulmonary hypertension,   PASP estimated 51 mmHg.  Assessment/Plan:    1. Newly documented atrial fibrillation: Rate controlled on coreg. 2-2.3 sec pauses 6 am today but patient asymptomatic.CHADSVASC score is 5. Normal LV function on 2-D echo yesterday with normal functioning bioprosthetic aortic valve. Discussed changing Plavix to Coumadin especially in light of possible stroke.(patient refused MRI). Continue to monitor HR.  2. CAD: S/P 4 vessel CABG 2006. Follow up stress test in 2012 negative for ischemia, troponin negative X 3. Does not endorse any anginal symptoms at this time.  3. Dizziness/Near Syncope:  Not orthostatic Probable CVA. Has refused MRI  4. Thoracic AA: CT January 2017 demonstrates 4.8 centimeters, stable.   5. S/P Edwards Tissue Valve secondary to aortic regurgitation 2006: Last echo in 2014 demonstrated no significant regurgitation, with no perivalvular leak. Echo is pending.   6. Hypertension: Medications include coreg, amlodipine, and torsemide PRN.   7:Moderate pulmonary HTN and probably  diastolic CHF on IV Lasix. Negative 1250 overnight.   Terry Barrios PA-C  08/24/2015, 9:44 AM   Attending note:  Patient seen and examined. Reviewed interval hospital course, PT findings, echocardiogram, and telemetry. Agree with above assessment by Ms. Bonnell Public PA-C. Terry Wu feels much better today. Denies any further dizziness, no chest pain. Has been up in his room this morning, worked with PT and had significant improvement since yesterday. There is still concern that he may have had a stroke, not seen on CT. Neurology consultation is pending. Also question about whether he may need some degree of rehabilitation. From a cardiac perspective his telemetry shows persistent atrial fibrillation that is rate controlled, he has some pauses around 2 seconds that are not clearly symptomatic.  On examination he appears comfortable, seated bedside chair. Heart rate in the 60s in atrial fibrillation. Blood pressure [id number] systolic. Lungs decreased breath sounds with prolonged expiratory phase. Cardiac exam with irregularly irregular rhythm and cardiac murmur as before. Leg edema persists. Follow-up echocardiogram shows LVEF 60-65% with moderate to severe left atrial enlargement, stable bioprosthetic aortic valve with normal function. Also evidence of moderate pulmonary hypertension.  Discussed situation with patient and family. Recommend transitioning from Plavix to Coumadin for stroke prophylaxis with atrial fibrillation. Continue current dose of Coreg. He is now on IV Lasix for diuresis, will need to go back on Demadex at home at least on an as-needed basis. Depending on further neurological workup and potential for rehabilitation, we will consider an outpatient cardiac monitor when he is ready for discharge, mainly to evaluate heart rate variability and exclude any significant bradycardia or pauses.  Satira Sark, M.D., F.A.C.C.

## 2015-08-24 NOTE — Progress Notes (Signed)
Subjective: He says he feels a little bit better. He is still somewhat weak and short of breath. He still has swelling of his legs. No chest pain. He is aware of his atrial fibrillation. He had 2.18 second pause on telemetry that was asymptomatic  Objective: Vital signs in last 24 hours: Temp:  [97.5 F (36.4 C)-98.6 F (37 C)] 98.6 F (37 C) (03/01 0509) Pulse Rate:  [65-81] 80 (03/01 0509) Resp:  [20] 20 (03/01 0509) BP: (124-138)/(71-81) 134/71 mmHg (03/01 0509) SpO2:  [93 %-95 %] 94 % (03/01 0747) Weight change:  Last BM Date: 08/23/15  Intake/Output from previous day: 02/28 0701 - 03/01 0700 In: 1200 [P.O.:1200] Out: 1550 [Urine:1550]  PHYSICAL EXAM General appearance: alert, cooperative and mild distress Resp: clear to auscultation bilaterally Cardio: irregularly irregular rhythm GI: soft, non-tender; bowel sounds normal; no masses,  no organomegaly Extremities: He still has edema despite oral torsemide  Lab Results:  Results for orders placed or performed during the hospital encounter of 08/22/15 (from the past 48 hour(s))  Basic metabolic panel     Status: Abnormal   Collection Time: 08/22/15 10:36 AM  Result Value Ref Range   Sodium 139 135 - 145 mmol/L   Potassium 4.2 3.5 - 5.1 mmol/L   Chloride 102 101 - 111 mmol/L   CO2 27 22 - 32 mmol/L   Glucose, Bld 161 (H) 65 - 99 mg/dL   BUN 12 6 - 20 mg/dL   Creatinine, Ser 0.75 0.61 - 1.24 mg/dL   Calcium 9.2 8.9 - 10.3 mg/dL   GFR calc non Af Amer >60 >60 mL/min   GFR calc Af Amer >60 >60 mL/min    Comment: (NOTE) The eGFR has been calculated using the CKD EPI equation. This calculation has not been validated in all clinical situations. eGFR's persistently <60 mL/min signify possible Chronic Kidney Disease.    Anion gap 10 5 - 15  CBC with Differential/Platelet     Status: Abnormal   Collection Time: 08/22/15 10:36 AM  Result Value Ref Range   WBC 11.3 (H) 4.0 - 10.5 K/uL   RBC 4.06 (L) 4.22 - 5.81 MIL/uL   Hemoglobin 13.0 13.0 - 17.0 g/dL   HCT 38.1 (L) 39.0 - 52.0 %   MCV 93.8 78.0 - 100.0 fL   MCH 32.0 26.0 - 34.0 pg   MCHC 34.1 30.0 - 36.0 g/dL   RDW 13.1 11.5 - 15.5 %   Platelets 169 150 - 400 K/uL   Neutrophils Relative % 77 %   Neutro Abs 8.8 (H) 1.7 - 7.7 K/uL   Lymphocytes Relative 13 %   Lymphs Abs 1.4 0.7 - 4.0 K/uL   Monocytes Relative 8 %   Monocytes Absolute 0.8 0.1 - 1.0 K/uL   Eosinophils Relative 2 %   Eosinophils Absolute 0.2 0.0 - 0.7 K/uL   Basophils Relative 0 %   Basophils Absolute 0.0 0.0 - 0.1 K/uL  Troponin I     Status: None   Collection Time: 08/22/15 10:36 AM  Result Value Ref Range   Troponin I <0.03 <0.031 ng/mL    Comment:        NO INDICATION OF MYOCARDIAL INJURY.   Brain natriuretic peptide     Status: Abnormal   Collection Time: 08/22/15 10:36 AM  Result Value Ref Range   B Natriuretic Peptide 234.0 (H) 0.0 - 100.0 pg/mL  TSH     Status: None   Collection Time: 08/22/15 10:36 AM  Result Value Ref  Range   TSH 2.224 0.350 - 4.500 uIU/mL  Troponin I     Status: None   Collection Time: 08/22/15  7:55 PM  Result Value Ref Range   Troponin I <0.03 <0.031 ng/mL    Comment:        NO INDICATION OF MYOCARDIAL INJURY.   Comprehensive metabolic panel     Status: Abnormal   Collection Time: 08/23/15  1:50 AM  Result Value Ref Range   Sodium 138 135 - 145 mmol/L   Potassium 4.0 3.5 - 5.1 mmol/L   Chloride 103 101 - 111 mmol/L   CO2 27 22 - 32 mmol/L   Glucose, Bld 149 (H) 65 - 99 mg/dL   BUN 11 6 - 20 mg/dL   Creatinine, Ser 0.67 0.61 - 1.24 mg/dL   Calcium 8.6 (L) 8.9 - 10.3 mg/dL   Total Protein 6.2 (L) 6.5 - 8.1 g/dL   Albumin 3.5 3.5 - 5.0 g/dL   AST 15 15 - 41 U/L   ALT 15 (L) 17 - 63 U/L   Alkaline Phosphatase 26 (L) 38 - 126 U/L   Total Bilirubin 0.6 0.3 - 1.2 mg/dL   GFR calc non Af Amer >60 >60 mL/min   GFR calc Af Amer >60 >60 mL/min    Comment: (NOTE) The eGFR has been calculated using the CKD EPI equation. This calculation  has not been validated in all clinical situations. eGFR's persistently <60 mL/min signify possible Chronic Kidney Disease.    Anion gap 8 5 - 15  CBC     Status: Abnormal   Collection Time: 08/23/15  1:50 AM  Result Value Ref Range   WBC 9.1 4.0 - 10.5 K/uL   RBC 3.55 (L) 4.22 - 5.81 MIL/uL   Hemoglobin 11.5 (L) 13.0 - 17.0 g/dL   HCT 33.4 (L) 39.0 - 52.0 %   MCV 94.1 78.0 - 100.0 fL   MCH 32.4 26.0 - 34.0 pg   MCHC 34.4 30.0 - 36.0 g/dL   RDW 13.0 11.5 - 15.5 %   Platelets 144 (L) 150 - 400 K/uL  Troponin I     Status: None   Collection Time: 08/23/15  1:50 AM  Result Value Ref Range   Troponin I <0.03 <0.031 ng/mL    Comment:        NO INDICATION OF MYOCARDIAL INJURY.   Hemoglobin A1c     Status: Abnormal   Collection Time: 08/23/15  1:50 AM  Result Value Ref Range   Hgb A1c MFr Bld 6.3 (H) 4.8 - 5.6 %    Comment: (NOTE)         Pre-diabetes: 5.7 - 6.4         Diabetes: >6.4         Glycemic control for adults with diabetes: <7.0    Mean Plasma Glucose 134 mg/dL    Comment: (NOTE) Performed At: [facility] [place], [place] [id number] Lindon Romp MD MV:[id number]   Urinalysis, Routine w reflex microscopic (not at [facility])     Status: None   Collection Time: 08/23/15  6:35 AM  Result Value Ref Range   Color, Urine YELLOW YELLOW   APPearance CLEAR CLEAR   Specific Gravity, Urine 1.015 1.005 - 1.030   pH 6.5 5.0 - 8.0   Glucose, UA NEGATIVE NEGATIVE mg/dL   Hgb urine dipstick NEGATIVE NEGATIVE   Bilirubin Urine NEGATIVE NEGATIVE   Ketones, ur NEGATIVE NEGATIVE mg/dL   Protein, ur NEGATIVE NEGATIVE  mg/dL   Nitrite NEGATIVE NEGATIVE   Leukocytes, UA NEGATIVE NEGATIVE    Comment: MICROSCOPIC NOT DONE ON URINES WITH NEGATIVE PROTEIN, BLOOD, LEUKOCYTES, NITRITE, OR GLUCOSE <1000 mg/dL.  Troponin I     Status: None   Collection Time: 08/23/15  8:24 AM  Result Value Ref Range   Troponin I <0.03 <0.031 ng/mL    Comment:        NO INDICATION  OF MYOCARDIAL INJURY.   Glucose, capillary     Status: Abnormal   Collection Time: 08/23/15 11:07 AM  Result Value Ref Range   Glucose-Capillary 149 (H) 65 - 99 mg/dL  Glucose, capillary     Status: None   Collection Time: 08/23/15  4:18 PM  Result Value Ref Range   Glucose-Capillary 88 65 - 99 mg/dL   Comment 1 Notify RN    Comment 2 Document in Chart   Glucose, capillary     Status: None   Collection Time: 08/23/15  9:23 PM  Result Value Ref Range   Glucose-Capillary 87 65 - 99 mg/dL   Comment 1 Notify RN    Comment 2 Document in Chart   Basic metabolic panel     Status: Abnormal   Collection Time: 08/24/15  5:44 AM  Result Value Ref Range   Sodium 140 135 - 145 mmol/L   Potassium 3.9 3.5 - 5.1 mmol/L   Chloride 102 101 - 111 mmol/L   CO2 30 22 - 32 mmol/L   Glucose, Bld 94 65 - 99 mg/dL   BUN 17 6 - 20 mg/dL   Creatinine, Ser 0.83 0.61 - 1.24 mg/dL   Calcium 8.6 (L) 8.9 - 10.3 mg/dL   GFR calc non Af Amer >60 >60 mL/min   GFR calc Af Amer >60 >60 mL/min    Comment: (NOTE) The eGFR has been calculated using the CKD EPI equation. This calculation has not been validated in all clinical situations. eGFR's persistently <60 mL/min signify possible Chronic Kidney Disease.    Anion gap 8 5 - 15  Glucose, capillary     Status: None   Collection Time: 08/24/15  7:25 AM  Result Value Ref Range   Glucose-Capillary 82 65 - 99 mg/dL   Comment 1 Notify RN     ABGS No results for input(s): PHART, PO2ART, TCO2, HCO3 in the last 72 hours.  Invalid input(s): PCO2 CULTURES No results found for this or any previous visit (from the past 240 hour(s)). Studies/Results: Dg Chest 2 View  08/22/2015  CLINICAL DATA:  Near syncope. EXAM: CHEST  2 VIEW COMPARISON:  CT 07/15/2015 new FINDINGS: Sternotomy wires overlie enlarged cardiac silhouette with ectatic aorta. Uette. Large epicardial fat pad again noted. No fusion from infiltrate, pneumothorax. IMPRESSION: No acute cardiopulmonary  process.  Stable cardiomegaly. Electronically Signed   By: Suzy Bouchard M.D.   On: 08/22/2015 11:47   Ct Head Wo Contrast  08/22/2015  CLINICAL DATA:  Dizziness with lightheadedness for 4 hours, new onset, unable to ambulate, history benign hypertension, coronary artery disease post CABG and AVR, COPD EXAM: CT HEAD WITHOUT CONTRAST TECHNIQUE: Contiguous axial images were obtained from the base of the skull through the vertex without intravenous contrast. COMPARISON:  None FINDINGS: Minimal atrophy. Normal ventricular morphology. No midline shift or mass effect. Normal appearance of brain parenchyma. No intracranial hemorrhage, mass lesion or evidence acute infarction. No extra-axial fluid collections. Extensive atherosclerotic calcifications of internal carotid and vertebral arteries at skullbase. Bones and sinuses unremarkable. IMPRESSION: No acute intracranial  abnormalities as above. Electronically Signed   By: Lavonia Dana M.D.   On: 08/22/2015 13:38    Medications:  Prior to Admission:  Prescriptions prior to admission  Medication Sig Dispense Refill Last Dose  . amLODipine (NORVASC) 10 MG tablet TAKE ONE TABLET BY MOUTH ONCE DAILY 30 tablet 10 08/21/2015 at Unknown time  . amoxicillin (AMOXIL) 500 MG capsule Take 2,000 mg by mouth as directed.    08/21/2015 at Unknown time  . atorvastatin (LIPITOR) 20 MG tablet Take 1 tablet (20 mg total) by mouth daily. 90 tablet 3 08/21/2015 at Unknown time  . carvedilol (COREG) 12.5 MG tablet Take 1 tablet (12.5 mg total) by mouth 2 (two) times daily with a meal. 180 tablet 3 08/21/2015 at 0730  . clopidogrel (PLAVIX) 75 MG tablet Take 1 tablet (75 mg total) by mouth daily. 90 tablet 3 08/21/2015 at 0900  . Coenzyme Q10 (CO Q 10) 100 MG CAPS Take 1 capsule by mouth every morning.   08/21/2015 at Unknown time  . glimepiride (AMARYL) 2 MG tablet Take 2 mg by mouth daily before breakfast.    08/21/2015 at Unknown time  . latanoprost (XALATAN) 0.005 % ophthalmic  solution Place 1 drop into both eyes at bedtime.   08/21/2015 at Unknown time  . magnesium oxide (MAG-OX) 400 MG tablet Take 400 mg by mouth daily.   08/21/2015 at Unknown time  . Multiple Vitamins-Minerals (ICAPS PO) Take 2 tablets by mouth 3 (three) times daily after meals.    08/21/2015 at Unknown time  . Naproxen Sodium (ALEVE) 220 MG CAPS Take 220 mg by mouth 2 (two) times daily.   08/21/2015 at Unknown time  . pantoprazole (PROTONIX) 40 MG tablet Take 1 tablet (40 mg total) by mouth daily. 90 tablet 3 08/21/2015 at Unknown time  . potassium chloride SA (K-DUR,KLOR-CON) 20 MEQ tablet Take 20 mEq by mouth 2 (two) times daily.   08/21/2015 at Unknown time  . telmisartan (MICARDIS) 40 MG tablet Take 40 mg by mouth daily.   08/21/2015 at Unknown time  . torsemide (DEMADEX) 100 MG tablet Take 100 mg by mouth daily.   08/21/2015 at Unknown time  . triamterene-hydrochlorothiazide (MAXZIDE-25) 37.5-25 MG per tablet Take 1 tablet by mouth daily.   08/21/2015 at Unknown time   Scheduled: . amLODipine  10 mg Oral Daily  . atorvastatin  20 mg Oral Daily  . carvedilol  12.5 mg Oral BID WC  . clopidogrel  75 mg Oral Daily  . enoxaparin (LOVENOX) injection  40 mg Subcutaneous Q24H  . furosemide  40 mg Intravenous Q12H  . guaiFENesin  1,200 mg Oral BID  . insulin aspart  0-15 Units Subcutaneous TID WC  . insulin aspart  0-5 Units Subcutaneous QHS  . ipratropium-albuterol  3 mL Nebulization TID  . irbesartan  150 mg Oral Daily  . latanoprost  1 drop Both Eyes QHS  . magnesium oxide  400 mg Oral Daily  . pantoprazole  40 mg Oral Daily  . potassium chloride SA  20 mEq Oral BID  . sodium chloride flush  3 mL Intravenous Q12H  . sodium chloride flush  3 mL Intravenous Q12H   Continuous:  ZOX:WRUEAV chloride, acetaminophen **OR** acetaminophen, ondansetron **OR** ondansetron (ZOFRAN) IV, sodium chloride flush  Assesment: He has new onset atrial fibrillation. Based on the assessment from physical therapy I  think he may have had a stroke. He had significant cerebellar signs on his exam. Neurology consultation is pending. He had a  greater than 2 seconds all others that was asymptomatic on the monitor.  His echocardiogram shows good systolic function but we could not tell about diastolic function. He has right atrial and left atrial enlargement. He has pulmonary hypertension by echo. Diastolic function could not be assessed but he has been thought to have diastolic heart failure in the past  He has had an aortic valve replacement and by echocardiogram the valve looks okay. He has an aortic aneurysm and that seems to be unchanged.  He has a history of COPD and I suppose his pulmonary hypertension could be related to that. He needs to have his oxygen level checked because he may need oxygen. Active Problems:   Mixed hyperlipidemia   HYPERTENSION, BENIGN   S/P AVR   COPD (chronic obstructive pulmonary disease) (HCC)   Bilateral leg edema   Aortic aneurysm (HCC)   Gait difficulty   Dizziness   New onset atrial fibrillation ([place])    Plan: Continue treatments. Discuss with cardiology regarding anticoagulation. I told him that it would probably be with warfarin. With his previous valve replacement he may not be a candidate for one of the novel anticoagulants. Recommendation was made for inpatient rehabilitation and I think that would be appropriate. Check his oxygen level. Neurology consultation today switch him to IV Lasix from by mouth torsemide    Maylon Sailors L 08/24/2015, 8:21 AM

## 2015-08-24 NOTE — Progress Notes (Signed)
Central telemetry reported a 2.18 sec pause @ 0615.

## 2015-08-24 NOTE — Evaluation (Signed)
Occupational Therapy Evaluation Patient Details Name: Terry Wu MRN: [id number] DOB: 1932/06/07 Today's Date: 08/24/2015    History of Present Illness 82yo white male, previously fully indep comes to [facility] after sudden onset of global weakness, nausea s vomitting, and intermittent dizziness. Pt is familiar with episodic vertigo chronically, but denies and vertiginous dizziness yesterday.    Clinical Impression   Pt awake, alert, oriented x4 this am, sitting in recliner agreeable to OT evaluation. Pt reports he has been feeling better today with no episodes or dizziness or lightheaded feelings. Pt able to perform ADL tasks with mod independence, no coordination deficits or dysmetria present this date. Pt performed sit-stand transfer independently, no LOB or ataxia present. Pt performed functional mobility tasks with supervision, no LOB, pt states his gait/ambulation is close to his baseline. Pt is at baseline with ADL performance, spoke with PT and requested reassessment and updated recommendations, as pt symptoms have improved and placement at CIR or SNF is no longer appropriate. No further acute OT needs at this time.       Follow Up Recommendations  No OT follow up;Supervision - Intermittent    Equipment Recommendations  None recommended by OT       Precautions / Restrictions Precautions Precautions: Fall Restrictions Weight Bearing Restrictions: No      Mobility Bed Mobility                  Transfers Overall transfer level: Independent   Transfers: Sit to/from Stand Sit to Stand: Independent                   ADL Overall ADL's : Modified independent;At baseline                                             Vision Vision Assessment?: Yes Eye Alignment: Within Functional Limits Ocular Range of Motion: Within Functional Limits Alignment/Gaze Preference: Within Defined Limits Tracking/Visual Pursuits: Able to track stimulus in all quads  without difficulty Saccades: Within functional limits Convergence: Within functional limits Visual Fields: No apparent deficits          Pertinent Vitals/Pain Pain Assessment: No/denies pain     Hand Dominance Right   Extremity/Trunk Assessment Upper Extremity Assessment Upper Extremity Assessment: Overall WFL for tasks assessed RUE Deficits / Details: No coordination or deficits or dysmetria present this date   Lower Extremity Assessment Lower Extremity Assessment: Overall WFL for tasks assessed       Communication Communication Communication: No difficulties   Cognition Arousal/Alertness: Awake/alert Behavior During Therapy: WFL for tasks assessed/performed Overall Cognitive Status: Within Functional Limits for tasks assessed                                Home Living Family/patient expects to be discharged to:: Private residence Living Arrangements: Spouse/significant other;Children Available Help at Discharge: Family;Available PRN/intermittently         Home Layout: Multi-level;Able to live on main level with bedroom/bathroom (sleeps in recliner due to arthritis pain)     Bathroom Shower/Tub: Teacher, early years/pre: Standard     Home Equipment: Walker - standard          Prior Functioning/Environment Level of Independence: Independent        Comments: poor tolerance of prolonged ambulation, does better with  shopping buggy for stability.      End of Session Equipment Utilized During Treatment: Gait belt;Rolling walker  Activity Tolerance: Patient tolerated treatment well Patient left: in chair;with call bell/phone within reach;with family/visitor present   Time: 0831-0904 OT Time Calculation (min): 33 min Charges:  OT General Charges $OT Visit: 1 Procedure OT Evaluation $OT Eval Moderate Complexity: 1 Procedure G-Codes: OT G-codes **NOT FOR INPATIENT CLASS** Functional Assessment Tool Used: clinical  judgement Functional Limitation: Self care Self Care Current Status [id number]): At least 1 percent but less than 20 percent impaired, limited or restricted Self Care Goal Status [id number]): At least 1 percent but less than 20 percent impaired, limited or restricted Self Care Discharge Status [phone]): At least 1 percent but less than 20 percent impaired, limited or restricted  Guadelupe Sabin, OTR/L  [phone]  08/24/2015, 9:44 AM

## 2015-08-25 LAB — BASIC METABOLIC PANEL
ANION GAP: 8 (ref 5–15)
BUN: 15 mg/dL (ref 6–20)
CHLORIDE: 102 mmol/L (ref 101–111)
CO2: 30 mmol/L (ref 22–32)
Calcium: 8.5 mg/dL — ABNORMAL LOW (ref 8.9–10.3)
Creatinine, Ser: 0.88 mg/dL (ref 0.61–1.24)
GFR calc Af Amer: >60 mL/min (ref >60)
GLUCOSE: 96 mg/dL (ref 65–99)
POTASSIUM: 4.1 mmol/L (ref 3.5–5.1)
Sodium: 140 mmol/L (ref 135–145)

## 2015-08-25 LAB — PROTIME-INR
INR: 1.3 (ref 0.00–1.49)
PROTHROMBIN TIME: 16.3 s — AB (ref 11.6–15.2)

## 2015-08-25 LAB — GLUCOSE, CAPILLARY
GLUCOSE-CAPILLARY: 91 mg/dL (ref 65–99)
GLUCOSE-CAPILLARY: 163 mg/dL — AB (ref 65–99)

## 2015-08-25 LAB — CBC
HCT: 35.4 % — ABNORMAL LOW (ref 39.0–52.0)
HEMOGLOBIN: 12.1 g/dL — AB (ref 13.0–17.0)
MCH: 32.1 pg (ref 26.0–34.0)
MCHC: 34.2 g/dL (ref 30.0–36.0)
MCV: 93.9 fL (ref 78.0–100.0)
PLATELETS: 160 10*3/uL (ref 150–400)
RBC: 3.77 MIL/uL — AB (ref 4.22–5.81)
RDW: 13.2 % (ref 11.5–15.5)
WBC: 6.6 10*3/uL (ref 4.0–10.5)

## 2015-08-25 MED ORDER — IPRATROPIUM-ALBUTEROL 0.5-2.5 (3) MG/3ML IN SOLN: 3.0000 mL | RESPIRATORY_TRACT | Status: DC | PRN

## 2015-08-25 MED ORDER — WARFARIN SODIUM 5 MG PO TABS: 5.0000 mg | ORAL_TABLET | Freq: Once | ORAL | Status: DC

## 2015-08-25 NOTE — Progress Notes (Signed)
[place] for Coumadin Indication: atrial fibrillation  Allergies  Allergen Reactions  . Aspirin     Unknown, per pt  . Demerol     Unknown, per pt   Patient Measurements: Height: 5\' 9"  (175.3 cm) Weight: 243 lb 13.6 oz (110.61 kg) IBW/kg (Calculated) : 70.7  Vital Signs: Temp: 97.5 F (36.4 C) (03/02 0500) Temp Source: Oral (03/02 0500) BP: 130/62 mmHg (03/02 0500) Pulse Rate: 68 (03/02 0500)  Labs:  Recent Labs  08/22/15 1036 08/22/15 1955 08/23/15 0150 08/23/15 0824 08/24/15 0544 08/24/15 1025 08/25/15 0604  HGB 13.0  --  11.5*  --   --   --  12.1*  HCT 38.1*  --  33.4*  --   --   --  35.4*  PLT 169  --  144*  --   --   --  160  LABPROT  --   --   --   --   --  15.8* 16.3*  INR  --   --   --   --   --  1.25 1.30  CREATININE 0.75  --  0.67  --  0.83  --  0.88  TROPONINI <0.03 <0.03 <0.03 <0.03  --   --   --    Estimated Creatinine Clearance: 80.7 mL/min (by C-G formula based on Cr of 0.88).  Medical History: Past Medical History  Diagnosis Date  . Mixed hyperlipidemia   . Essential hypertension   . Esophageal reflux   . CAD (coronary artery disease)     Multivessel status post CABG 2006  . Aortic regurgitation     Bioprosthetic AVR 2006  . Type 2 diabetes mellitus ([place])   . Aortic aneurysm (HCC)     Ascending thoracic - 4.8 cm  . History of dizziness    Medications:  Prescriptions prior to admission  Medication Sig Dispense Refill Last Dose  . amLODipine (NORVASC) 10 MG tablet TAKE ONE TABLET BY MOUTH ONCE DAILY 30 tablet 10 08/21/2015 at Unknown time  . amoxicillin (AMOXIL) 500 MG capsule Take 2,000 mg by mouth as directed.    08/21/2015 at Unknown time  . atorvastatin (LIPITOR) 20 MG tablet Take 1 tablet (20 mg total) by mouth daily. 90 tablet 3 08/21/2015 at Unknown time  . carvedilol (COREG) 12.5 MG tablet Take 1 tablet (12.5 mg total) by mouth 2 (two) times daily with a meal. 180 tablet 3 08/21/2015 at 0730  .  clopidogrel (PLAVIX) 75 MG tablet Take 1 tablet (75 mg total) by mouth daily. 90 tablet 3 08/21/2015 at 0900  . Coenzyme Q10 (CO Q 10) 100 MG CAPS Take 1 capsule by mouth every morning.   08/21/2015 at Unknown time  . glimepiride (AMARYL) 2 MG tablet Take 2 mg by mouth daily before breakfast.    08/21/2015 at Unknown time  . latanoprost (XALATAN) 0.005 % ophthalmic solution Place 1 drop into both eyes at bedtime.   08/21/2015 at Unknown time  . magnesium oxide (MAG-OX) 400 MG tablet Take 400 mg by mouth daily.   08/21/2015 at Unknown time  . Multiple Vitamins-Minerals (ICAPS PO) Take 2 tablets by mouth 3 (three) times daily after meals.    08/21/2015 at Unknown time  . Naproxen Sodium (ALEVE) 220 MG CAPS Take 220 mg by mouth 2 (two) times daily.   08/21/2015 at Unknown time  . pantoprazole (PROTONIX) 40 MG tablet Take 1 tablet (40 mg total) by mouth daily. 90 tablet 3 08/21/2015 at  Unknown time  . potassium chloride SA (K-DUR,KLOR-CON) 20 MEQ tablet Take 20 mEq by mouth 2 (two) times daily.   08/21/2015 at Unknown time  . telmisartan (MICARDIS) 40 MG tablet Take 40 mg by mouth daily.   08/21/2015 at Unknown time  . torsemide (DEMADEX) 100 MG tablet Take 100 mg by mouth daily.   08/21/2015 at Unknown time  . triamterene-hydrochlorothiazide (MAXZIDE-25) 37.5-25 MG per tablet Take 1 tablet by mouth daily.   08/21/2015 at Unknown time   Summary: 82 yo man on coumadin for afib. Will continue Lovenox for DVT px until INR at goal.  Goal of Therapy:  INR 2-3 Monitor platelets by anticoagulation protocol: Yes   Plan:  Coumadin 5mg  today x 1 Continue Lovenox until INR at goal INR daily Monitor for s/sx of bleeding complications  Terry Wu 08/25/2015,9:08 AM

## 2015-08-25 NOTE — Progress Notes (Signed)
He is much improved. His therapy evaluation was much better. He has no new complaints. I think he is ready for discharge after physical therapy and dietary consultation today

## 2015-08-25 NOTE — Progress Notes (Signed)
Physical Therapy Treatment Patient Details Name: Terry Wu MRN: [id number] DOB: 07-02-1931 Today's Date: 08/25/2015    History of Present Illness 82yo white male, previously fully indep comes to [facility] after sudden onset of global weakness, nausea s vomitting, and intermittent dizziness. Pt is familiar with episodic vertigo chronically, but denies any vertiginous dizziness yesterday. All symptoms were stable until morning of evaluation wherein dizziness and nausea had subsided. On second day of PT, pt free from  dysmetria per OT note, and ambulating without any noted ataxia.     PT Comments    Pt tolerating treatment session well, motivated and able to complete entire PT sesssion as planned. Pt has improved yet again, since yesterday, now presenting fairly close to baseline. He reports his balance, dizziness, and nausea have fully resolved, but reports to feel his strength remains impaired. Pt continues to make progress toward goals as evidenced by improved ambulation distance and higher level of indep with transfers. Pt's greatest limitation continues to be strength and activity tolerance which continue to limit ability to perform IADL at baseline function. Patient presenting with impairment of strength, pain, range of motion, balance, and activity tolerance, limiting ability to perform ADL and mobility tasks at  baseline level of function. Patient will benefit from skilled intervention to address the above impairments and limitations, in order to restore to prior level of function, improve patient safety upon discharge, and to decrease caregiver burden.     Follow Up Recommendations  Outpatient PT;Supervision for mobility/OOB     Equipment Recommendations  None recommended by PT    Recommendations for Other Services       Precautions / Restrictions Restrictions Weight Bearing Restrictions: No    Mobility  Bed Mobility               General bed mobility comments: received in  chair.   Transfers Overall transfer level: Modified independent Equipment used: None Transfers: Sit to/from Stand Sit to Stand: Supervision         General transfer comment: performed 2x5 sit to stands with minimal to no BUE assistance for strengthening and balance.   Ambulation/Gait Ambulation/Gait assistance: Min guard Ambulation Distance (Feet): 240 Feet Assistive device: None   Gait velocity: 0.78ms Gait velocity interpretation: <1.8 ft/sec, indicative of risk for recurrent falls General Gait Details: Step through bilat; R trendelenburg apparent after 1036f DF less on L than R. Pt stops to rest standing several times, reoprting that he feels 'tired' but cannot otherwise qualitfy, denying SOB, leg weakness, etc. Audible tachypnea noted.    Stairs            Wheelchair Mobility    Modified Rankin (Stroke Patients Only)       Balance Overall balance assessment: Modified Independent   Sitting balance-Leahy Scale: Normal     Standing balance support: No upper extremity supported;During functional activity Standing balance-Leahy Scale: Good                      Cognition Arousal/Alertness: Awake/alert Behavior During Therapy: WFL for tasks assessed/performed Overall Cognitive Status: Within Functional Limits for tasks assessed                      Exercises      General Comments        Pertinent Vitals/Pain Pain Assessment: No/denies pain    Home Living Family/patient expects to be discharged to:: Private residence Living Arrangements: Spouse/significant other  Prior Function            PT Goals (current goals can now be found in the care plan section) Acute Rehab PT Goals Patient Stated Goal: regain functional indep. PT Goal Formulation: With patient Time For Goal Achievement: 09/06/15 Potential to Achieve Goals: Good Progress towards PT goals: Goals met and updated - see care plan    Frequency   7X/week    PT Plan Current plan remains appropriate    Co-evaluation             End of Session Equipment Utilized During Treatment: Gait belt Activity Tolerance: Patient tolerated treatment well;Patient limited by fatigue Patient left: in chair;with call bell/phone within reach;with family/visitor present     Time: 1005-1014 PT Time Calculation (min) (ACUTE ONLY): 9 min  Charges:  $Therapeutic Activity: 8-22 mins                    G Codes:      12:48 PM, Sep 23, 2015 Etta Grandchild, PT, DPT PRN Physical Therapist at [place] License # [id number] [phone] (wireless)  [phone] (mobile)

## 2015-08-25 NOTE — Progress Notes (Signed)
Primary cardiologist: Dr. Jenkins Rouge  Seen for followup: Atrial fibrillation  Subjective:    Patient up in chair, states that he feels better, no dizziness, no chest pain or palpitations.  Objective:   Temp:  [97.5 F (36.4 C)-98.5 F (36.9 C)] 97.5 F (36.4 C) (03/02 0500) Pulse Rate:  [68-72] 68 (03/02 0500) Resp:  [20] 20 (03/02 0500) BP: (129-132)/(62-82) 130/62 mmHg (03/02 0500) SpO2:  [93 %-100 %] 98 % (03/02 0748) Last BM Date: 08/23/15  Filed Weights   08/22/15 1020 08/22/15 1512 08/23/15 0557  Weight: 244 lb (110.678 kg) 243 lb 13.3 oz (110.6 kg) 243 lb 13.6 oz (110.61 kg)    Intake/Output Summary (Last 24 hours) at 08/25/15 0841 Last data filed at 08/25/15 0530  Gross per 24 hour  Intake    363 ml  Output   1150 ml  Net   -787 ml    Telemetry: Atrial fibrillation, pauses 2.0-2.5 seconds are asymptomatic.  Exam:  General: Obese male, sitting in bedside chair.  Lungs: Decreased breath sounds without wheezing.  Cardiac: Irregularly irregular.  Extremities: Chronic stasis with improved edema.  Lab Results:  Basic Metabolic Panel:  Recent Labs Lab 08/23/15 0150 08/24/15 0544 08/25/15 0604  NA 138 140 140  K 4.0 3.9 4.1  CL 103 102 102  CO2 27 30 30   GLUCOSE 149* 94 96  BUN 11 17 15   CREATININE 0.67 0.83 0.88  CALCIUM 8.6* 8.6* 8.5*    CBC:  Recent Labs Lab 08/22/15 1036 08/23/15 0150 08/25/15 0604  WBC 11.3* 9.1 6.6  HGB 13.0 11.5* 12.1*  HCT 38.1* 33.4* 35.4*  MCV 93.8 94.1 93.9  PLT 169 144* 160    Cardiac Enzymes:  Recent Labs Lab 08/22/15 1955 08/23/15 0150 08/23/15 0824  TROPONINI <0.03 <0.03 <0.03    Coagulation:  Recent Labs Lab 08/24/15 1025 08/25/15 0604  INR 1.25 1.30    Echocardiogram 08/23/2015: Study Conclusions  - Left ventricle: The cavity size was normal. Wall thickness was increased in a pattern of mild LVH. Systolic function was normal. The estimated ejection fraction was in the  range of 60% to 65%. Wall motion was normal; there were no regional wall motion abnormalities. - Aortic valve: 25 mm Edwards pericardial Magna prosthesis in aortic position. No obvious perivalvular leak. Leaflet motion is normal. There was no significant regurgitation. Mean gradient (S): 10 mm Hg. Peak gradient (S): 17 mm Hg. VTI ratio of LVOT to aortic valve: 0.61. Valve area (Vmax): 1.83 cm^2. Valve area (Vmean): 1.63 cm^2. - Aortic root: The aortic root was mildly to moderately dilated. - Mitral valve: Calcified annulus. Mildly thickened leaflets . There was trivial regurgitation. - Left atrium: The atrium was moderately to severely dilated. - Right atrium: The atrium was moderately to severely dilated. Central venous pressure (est): 8 mm Hg. - Tricuspid valve: There was trivial regurgitation. - Pulmonary arteries: Systolic pressure was moderately increased. PA peak pressure: 51 mm Hg (S). - Pericardium, extracardiac: There was no pericardial effusion.  Impressions:  - Mild LVH with LVEF 60-65%, indeterminate diastolic function in the setting of atrial fibrillation. Moderate to severe biatrial enlargement. MAC with mildly thickened mitral leaflets and trivial mitral regurgitation. Bioprosthetic aortic valve shows grossly normal function as detailed above. Trivial tricuspid regurgitation with evidence of moderate pulmonary hypertension, PASP estimated 51 mmHg.   Medications:   Scheduled Medications: . amLODipine  10 mg Oral Daily  . atorvastatin  20 mg Oral Daily  . carvedilol  12.5 mg  Oral BID WC  . enoxaparin (LOVENOX) injection  40 mg Subcutaneous Q24H  . furosemide  40 mg Intravenous Q12H  . guaiFENesin  1,200 mg Oral BID  . insulin aspart  0-15 Units Subcutaneous TID WC  . insulin aspart  0-5 Units Subcutaneous QHS  . ipratropium-albuterol  3 mL Nebulization TID  . irbesartan  150 mg Oral Daily  . latanoprost  1 drop Both Eyes QHS  .  magnesium oxide  400 mg Oral Daily  . pantoprazole  40 mg Oral Daily  . potassium chloride SA  20 mEq Oral BID  . sodium chloride flush  3 mL Intravenous Q12H  . sodium chloride flush  3 mL Intravenous Q12H  . Warfarin - Pharmacist Dosing Inpatient   Does not apply Q24H     PRN Medications:  sodium chloride, acetaminophen **OR** acetaminophen, ondansetron **OR** ondansetron (ZOFRAN) IV, sodium chloride flush   Assessment:   1. Newly documented atrial fibrillation. Heart rate is controlled on Coreg. He has asymptomatic 2.0-2.5 second pauses by telemetry. CHADSVASC score is 5. He has been started on Coumadin.  2. Bioprosthetic AVR 2006. Follow-up echocardiogram during this hospital stay shows normal function. LVEF 60-65%.  3. CAD status post CABG in 2006. No active angina symptoms. Troponin I levels argue against ACS.  4. Ascending thoracic aortic aneurysm, 4.8 cm in stable by recent chest CT January 2017.  5. TIA, improvement in symptoms. No acute stroke by head CT. Patient declined MRI.   Plan/Discussion:    Discussed with Dr. Luan Pulling. Plan is for discharge home today. Patient would like to establish in the [place] office for convenience. He will be established in the anticoagulation clinic, seen Monday for INR. Discharge home on Coumadin 5 mg daily for now. He is off Plavix. Otherwise no change to current cardiac regimen. Would resume use of Demadex at home. Clinical followup in 2-3 weeks.  Satira Sark, M.D., F.A.C.C.

## 2015-08-25 NOTE — Plan of Care (Addendum)
Problem: Food- and Nutrition-Related Knowledge Deficit (NB-1.1) Goal: Nutrition education Formal process to instruct or train a patient/client in a skill or to impart knowledge to help patients/clients voluntarily manage or modify food choices and eating behavior to maintain or improve health.  Outcome: Adequate for Discharge Consult for Coumadin education  Patient newly prescribed warfarin regimen. His wife is feeling anxious about preparing foods that are "safe" for him to eat.  Encouraged pt to eat a normal diet and SHOULD eat vegetables and greens (like salads) and try to eat about the same amounts every week. Emphasizing regular and consistent eating of vegetables and greens is important,  BUT  not to change his diet for foods containing vitamin K too quickly (like broccoli, cauliflower, cabbage, spinach and other dark green leafy vegetables).  Provided and reviewed handouts: patient Coumadin Teaching and Vitamin K Content for Common Foods. Recommended daily intake 80 mcg/day. Expect good adherence to guidelines.  Colman Cater MS,RD,CSG,LDN Office: [phone] Pager: [phone]

## 2015-08-25 NOTE — Care Management Note (Addendum)
Case Management Note  Patient Details  Name: Terry Wu MRN: [id number] Date of Birth: 04-Jan-1932  Expected Discharge Date:  08/25/15               Expected Discharge Plan:  Home/Self Care  In-House Referral:  NA  Discharge planning Services  CM Consult, Follow-up appt scheduled  Post Acute Care Choice:    Choice offered to:     DME Arranged:    DME Agency:     HH Arranged:    HH Agency:     Status of Service:  Completed, signed off  Medicare Important Message Given:    Date Medicare IM Given:    Medicare IM give by:    Date Additional Medicare IM Given:    Additional Medicare Important Message give by:     If discussed at [place] of Stay Meetings, dates discussed:    Additional Comments: Pt discharging home today with self care and close OP PT follow up. Referral faxed to [facility] office and office called and made request for appointment tomorrow, per MD request. Appointment given. Pt made aware. No DME needs at DC. Pt's family at bedside and aware of and agreeable to DC plan.  Sherald Barge, RN 08/25/2015, 11:21 AM

## 2015-08-25 NOTE — Discharge Summary (Signed)
Physician Discharge Summary  Patient ID: Terry Wu MRN: [id number] DOB/AGE: 80-Nov-1933 80 y.o. Primary Care Physician:Matheson Vandehei L, MD Admit date: 08/22/2015 Discharge date: 08/25/2015    Discharge Diagnoses:   Active Problems:   Mixed hyperlipidemia   HYPERTENSION, BENIGN   S/P AVR   COPD (chronic obstructive pulmonary disease) (HCC)   Bilateral leg edema   Aortic aneurysm (HCC)   Gait difficulty   Dizziness   New onset atrial fibrillation ([place])     Medication List    ASK your doctor about these medications        ALEVE 220 MG Caps  Generic drug:  Naproxen Sodium  Take 220 mg by mouth 2 (two) times daily.     amLODipine 10 MG tablet  Commonly known as:  NORVASC  TAKE ONE TABLET BY MOUTH ONCE DAILY     amoxicillin 500 MG capsule  Commonly known as:  AMOXIL  Take 2,000 mg by mouth as directed.     atorvastatin 20 MG tablet  Commonly known as:  LIPITOR  Take 1 tablet (20 mg total) by mouth daily.     carvedilol 12.5 MG tablet  Commonly known as:  COREG  Take 1 tablet (12.5 mg total) by mouth 2 (two) times daily with a meal.     clopidogrel 75 MG tablet  Commonly known as:  PLAVIX  Take 1 tablet (75 mg total) by mouth daily.     Co Q 10 100 MG Caps  Take 1 capsule by mouth every morning.     glimepiride 2 MG tablet  Commonly known as:  AMARYL  Take 2 mg by mouth daily before breakfast.     ICAPS PO  Take 2 tablets by mouth 3 (three) times daily after meals.     latanoprost 0.005 % ophthalmic solution  Commonly known as:  XALATAN  Place 1 drop into both eyes at bedtime.     magnesium oxide 400 MG tablet  Commonly known as:  MAG-OX  Take 400 mg by mouth daily.     pantoprazole 40 MG tablet  Commonly known as:  PROTONIX  Take 1 tablet (40 mg total) by mouth daily.     potassium chloride SA 20 MEQ tablet  Commonly known as:  K-DUR,KLOR-CON  Take 20 mEq by mouth 2 (two) times daily.     telmisartan 40 MG tablet  Commonly known as:  MICARDIS   Take 40 mg by mouth daily.     torsemide 100 MG tablet  Commonly known as:  DEMADEX  Take 100 mg by mouth daily.     triamterene-hydrochlorothiazide 37.5-25 MG tablet  Commonly known as:  MAXZIDE-25  Take 1 tablet by mouth daily.        Discharged Condition: Improved    Consults: Cardiology/neurology  Significant Diagnostic Studies: Dg Chest 2 View  08/22/2015  CLINICAL DATA:  Near syncope. EXAM: CHEST  2 VIEW COMPARISON:  CT 07/15/2015 new FINDINGS: Sternotomy wires overlie enlarged cardiac silhouette with ectatic aorta. Uette. Large epicardial fat pad again noted. No fusion from infiltrate, pneumothorax. IMPRESSION: No acute cardiopulmonary process.  Stable cardiomegaly. Electronically Signed   By: Suzy Bouchard M.D.   On: 08/22/2015 11:47   Ct Head Wo Contrast  08/22/2015  CLINICAL DATA:  Dizziness with lightheadedness for 4 hours, new onset, unable to ambulate, history benign hypertension, coronary artery disease post CABG and AVR, COPD EXAM: CT HEAD WITHOUT CONTRAST TECHNIQUE: Contiguous axial images were obtained from the base of the skull through the  vertex without intravenous contrast. COMPARISON:  None FINDINGS: Minimal atrophy. Normal ventricular morphology. No midline shift or mass effect. Normal appearance of brain parenchyma. No intracranial hemorrhage, mass lesion or evidence acute infarction. No extra-axial fluid collections. Extensive atherosclerotic calcifications of internal carotid and vertebral arteries at skullbase. Bones and sinuses unremarkable. IMPRESSION: No acute intracranial abnormalities as above. Electronically Signed   By: Lavonia Dana M.D.   On: 08/22/2015 13:38    Lab Results: Basic Metabolic Panel:  Recent Labs  08/24/15 0544 08/25/15 0604  NA 140 140  K 3.9 4.1  CL 102 102  CO2 30 30  GLUCOSE 94 96  BUN 17 15  CREATININE 0.83 0.88  CALCIUM 8.6* 8.5*   Liver Function Tests:  Recent Labs  08/23/15 0150  AST 15  ALT 15*  ALKPHOS 26*   BILITOT 0.6  PROT 6.2*  ALBUMIN 3.5     CBC:  Recent Labs  08/22/15 1036 08/23/15 0150 08/25/15 0604  WBC 11.3* 9.1 6.6  NEUTROABS 8.8*  --   --   HGB 13.0 11.5* 12.1*  HCT 38.1* 33.4* 35.4*  MCV 93.8 94.1 93.9  PLT 169 144* 160    No results found for this or any previous visit (from the past 240 hour(s)).   Hospital Course: He came in with weakness. He was found to have atrial fibrillation which is a new problem. As part of his evaluation initially it appeared that he might have had a stroke but that was later ruled out. He did much better with physical therapy was started on warfarin Plavix was discontinued and he is ready for discharge. He will follow with cardiology office here follow-up in the Coumadin clinic here and return to my office in  Discharge Exam: Blood pressure 130/62, pulse 68, temperature 97.5 F (36.4 C), temperature source Oral, resp. rate 20, height 5\' 9"  (1.753 m), weight 110.61 kg (243 lb 13.6 oz), SpO2 98 %. He is in atrial fibrillation. His chest is clear. He still has 1+ edema  Disposition: Home for outpatient physical therapy      Signed: Tin Engram L   08/25/2015, 9:17 AM

## 2015-08-26 ENCOUNTER — Ambulatory Visit (HOSPITAL_COMMUNITY): Payer: Medicare Other | Attending: Pulmonary Disease | Admitting: Physical Therapy

## 2015-08-26 ENCOUNTER — Ambulatory Visit (HOSPITAL_COMMUNITY): Payer: Medicare Other | Admitting: Physical Therapy

## 2015-08-26 ENCOUNTER — Encounter (HOSPITAL_COMMUNITY): Payer: Self-pay | Admitting: Physical Therapy

## 2015-08-26 DIAGNOSIS — R29898 Other symptoms and signs involving the musculoskeletal system: Secondary | ICD-10-CM

## 2015-08-26 DIAGNOSIS — R270 Ataxia, unspecified: Secondary | ICD-10-CM | POA: Insufficient documentation

## 2015-08-26 DIAGNOSIS — Z9181 History of falling: Secondary | ICD-10-CM

## 2015-08-26 DIAGNOSIS — Z7409 Other reduced mobility: Secondary | ICD-10-CM | POA: Insufficient documentation

## 2015-08-26 NOTE — Therapy (Signed)
[place], [place], [address] Phone: [phone]   Fax:  [phone]  Physical Therapy Evaluation  Patient Details  Name: Terry Wu MRN: [id number] Date of Birth: June 10, 1932 Referring Provider: Sinda Du, MD  Encounter Date: 08/26/2015      PT End of Session - 08/26/15 1638    Visit Number 1   Number of Visits 18   Date for PT Re-Evaluation 11/18/15   Authorization Type Medicare   Authorization Time Period G-code at 10th visit, KX at 15th visit   Authorization - Visit Number 1   Authorization - Number of Visits 10   PT Start Time [id number]   PT Stop Time 1433   PT Time Calculation (min) 45 min   Equipment Utilized During Treatment Gait belt   Activity Tolerance Patient tolerated treatment well   Behavior During Therapy [facility] for tasks assessed/performed      Past Medical History  Diagnosis Date  . Mixed hyperlipidemia   . Essential hypertension   . Esophageal reflux   . CAD (coronary artery disease)     Multivessel status post CABG 2006  . Aortic regurgitation     Bioprosthetic AVR 2006  . Type 2 diabetes mellitus ([place])   . Aortic aneurysm (HCC)     Ascending thoracic - 4.8 cm  . History of dizziness     Past Surgical History  Procedure Laterality Date  . Coronary artery bypass graft  2006    LIMA to LAD, SVG to diagonal, SVG to OM, SVG to PDA   . Aortic valve replacement  2006    Edwards Pericardial tissue valve  . Total knee arthroplasty Bilateral   . Hernia repair      There were no vitals filed for this visit.  Visit Diagnosis:  Ataxia - Plan: PT plan of care cert/re-cert  Weakness of both lower extremities - Plan: PT plan of care cert/re-cert  Risk for falls - Plan: PT plan of care cert/re-cert  Decreased mobility and endurance - Plan: PT plan of care cert/re-cert      Subjective Assessment - 08/26/15 1356    Subjective Monday morning awoke and started to feel dizzy and  lightheaded. Per patinet and spouse report, episode related to atrial fibrillation. He reports that he is almost back to being himself but still feeling a little unsteady. States that it was recommeded he use a rw but he has refused to use it up to this point.    Pertinent History history of bilateral knee replacements and periods of brief dizziness (<30 seconds).   Limitations Walking;House hold activities   How long can you sit comfortably? unlimited   How long can you stand comfortably? unlimited   How long can you walk comfortably? 3-5 minutes   Patient Stated Goals get back to being independent   Currently in Pain? No/denies            [facility] PT Assessment - 08/26/15 0001    Assessment   Medical Diagnosis ataxia   Referring Provider Sinda Du, MD   Onset Date/Surgical Date 08/22/15   Next MD Visit 08/29/15   Prior Therapy due to bilateral TKA   Precautions   Precautions Fall   Balance Screen   Has the patient fallen in the past 6 months No   Has the patient had a decrease in activity level because of a fear of falling?  Yes   [place] residence  Living Arrangements Spouse/significant other   Home Layout Two level;Able to live on main level with bedroom/bathroom   Home Equipment Walker - standard;Cane - single point   Prior Function   Level of Independence Needs assistance with ADLs   Comments spouse assisting with ADLs for stability   Cognition   Overall Cognitive Status Within Functional Limits for tasks assessed   Observation/Other Assessments   Observations mild side bend right   Sensation   Light Touch Appears Intact   Strength   Right Hip Flexion 4-/5   Right Hip ABduction 4-/5   Right Hip ADduction 4/5   Left Hip Flexion 4-/5   Left Hip ABduction 4-/5   Left Hip ADduction 4/5   Right Knee Flexion 4+/5   Right Knee Extension 4+/5   Left Knee Flexion 4+/5   Right Ankle Dorsiflexion 5/5   Right Ankle Plantar Flexion  5/5   Left Ankle Dorsiflexion 5/5   Left Ankle Plantar Flexion 5/5   Ambulation/Gait   Ambulation/Gait Yes   Ambulation/Gait Assistance 5: Supervision   Ambulation/Gait Assistance Details Noted to reach for object and spouse during ambulation due to instability.    Ambulation Distance (Feet) 125 Feet  X2   Assistive device Rolling walker;None   Gait Pattern Step-through pattern   Ambulation Surface Level   Stairs --   Gait Comments Patient entering the clinic without using any device. He was noted to reach for objects in the clinic or his spouse for stability. We did trial using a rw which the patient did demonstrate an improved pattern with regards to stability.    Berg Balance Test   Sit to Stand Able to stand  independently using hands   Standing Unsupported Able to stand 2 minutes with supervision   Sitting with Back Unsupported but Feet Supported on Floor or Stool Able to sit safely and securely 2 minutes   Stand to Sit Sits safely with minimal use of hands   Transfers Able to transfer safely, minor use of hands   Standing Unsupported with Eyes Closed Able to stand 10 seconds with supervision   Standing Ubsupported with Feet Together Able to place feet together independently and stand for 1 minute with supervision   From Standing, Reach Forward with Outstretched Arm Can reach forward >12 cm safely (5")   From Standing Position, Pick up Object from Floor Able to pick up shoe, needs supervision   From Standing Position, Turn to Look Behind Over each Shoulder Looks behind one side only/other side shows less weight shift   Turn 360 Degrees Able to turn 360 degrees safely but slowly   Standing Unsupported, Alternately Place Feet on Step/Stool Able to complete >2 steps/needs minimal assist   Standing Unsupported, One Foot in Front Able to take small step independently and hold 30 seconds   Standing on One Leg Unable to try or needs assist to prevent fall   Total Score 38   Berg comment:  results and recommendations were discussed with patient and spouse. Enouraged use of walker at home.                            PT Education - 08/26/15 1635    Education provided Yes   Education Details Discussed findings of evaluation and general POC, recommended use of walker until balance improves due to indications of increased fall risk. Discussed also home safety considerations with no throw rugs, well lit areas, using assistive device.  Person(s) Educated Patient;Spouse   Methods Explanation;Demonstration   Comprehension Verbalized understanding          PT Short Term Goals - 08/26/15 1650    PT SHORT TERM GOAL #1   Title Patient to be independent wtih a HEP.   Time 3   Period Weeks   Status New   PT SHORT TERM GOAL #2   Title Patient to ambulate household distances without an assistive device (>50 feet) or loss of balance.    Time 3   Period Weeks   Status New   PT SHORT TERM GOAL #3   Title Patient to report ability to perform static standing tasks at home without UE support.    Time 3   Period Weeks   Status New           PT Long Term Goals - 08/26/15 1653    PT LONG TERM GOAL #1   Title Patient to be independent with an advanced HEP for progress upon D/C from PT services.    Time 6   Period Weeks   Status New   PT LONG TERM GOAL #2   Title Patient to ambulate X 5 minutes without an assistive device or instability.    Time 6   Period Weeks   Status New   PT LONG TERM GOAL #3   Title Patient to ambulate up/down 12 steps with railing for full access to his home.    Time 6   Period Weeks   Status New   PT LONG TERM GOAL #4   Title Patient to score greater or equal to 48 on Berg Balance Test for decreased fall risk.    Time 6   Period Weeks   Status New               Plan - 08/26/15 1641    Clinical Impression Statement Patient is an 82 y.o. male who reports experiencing dizziness and instability on the morning of  08/22/15. He describes going to the hospital for further evaluation. Per the pateint and his spouse, they describe that this experience is being attributed to his atrial fibrillation. Since this time he states that he has been improving but he is still feeling unsteady with his ambulation. We discussed the results of the evaluation which revealed proximal LE weakness and a BERG balance score of 38. This score indicates that the patient is at a significant fall risk currently. The patient entered the clinic without an assistive device although he reported that he had been advised to use one previously. Following the session the patient was advised to use his walker for mobility due to his incresed risk of falling. The patient and his spouse agree with this by the completion of the session. Overall the patient is appropriate for continued PT sessions to address his decreased strength, endurance, and balance. The patient is in agreement and consents to continued PT sessions.    Pt will benefit from skilled therapeutic intervention in order to improve on the following deficits Abnormal gait;Decreased activity tolerance;Decreased balance;Decreased endurance;Decreased safety awareness;Decreased mobility;Difficulty walking;Decreased strength   Rehab Potential Good   PT Frequency 3x / week   PT Duration 6 weeks   PT Treatment/Interventions ADLs/Self Care Home Management;DME Instruction;Gait training;Stair training;Functional mobility training;Therapeutic exercise;Balance training;Neuromuscular re-education;Patient/family education   PT Next Visit Plan Check if using walker and modify assistive device as needed, establish HEP for general strengthening of bilateral LEs, balance activities as tolerated.    PT Home  Exercise Plan HEP-establish LE strengthening program   Consulted and Agree with Plan of Care Patient;Family member/caregiver   Family Member Consulted spouse          G-Codes - 09-02-15 1658     Functional Assessment Tool Used BERG, clinical judgment   Functional Limitation Mobility: Walking and moving around   Mobility: Walking and Moving Around Current Status [phone]) At least 40 percent but less than 60 percent impaired, limited or restricted   Mobility: Walking and Moving Around Goal Status [phone]) At least 1 percent but less than 20 percent impaired, limited or restricted       Problem List Patient Active Problem List   Diagnosis Date Noted  . Gait difficulty 08/22/2015  . Dizziness 08/22/2015  . New onset atrial fibrillation ([place]) 08/22/2015  . Aortic aneurysm ([place]) 02/01/2014  . CAP (community acquired pneumonia) 02/12/2013  . Hyponatremia 02/12/2013  . COPD (chronic obstructive pulmonary disease) ([place]) 02/12/2013  . Bilateral leg edema 02/12/2013  . DIZZINESS 01/26/2010  . SHORTNESS OF BREATH 10/29/2008  . AODM 07/23/2008  . Mixed hyperlipidemia 07/23/2008  . HYPERTENSION, BENIGN 07/23/2008  . ESOPHAGEAL REFLUX 07/23/2008  . S/P AVR 07/23/2008  . CORONARY ARTERY BYPASS GRAFT, HX OF 07/23/2008    Cassell Clement, PT, CSCS Pager [phone]  09/02/15, 5:09 PM  [facility] [address] [place], [place], [address] Phone: [phone]   Fax:  [phone]  Name: Terry Wu MRN: [id number] Date of Birth: 1931/08/25

## 2015-08-29 ENCOUNTER — Ambulatory Visit (INDEPENDENT_AMBULATORY_CARE_PROVIDER_SITE_OTHER): Payer: Medicare Other | Admitting: *Deleted

## 2015-08-29 DIAGNOSIS — I4891 Unspecified atrial fibrillation: Secondary | ICD-10-CM

## 2015-08-29 DIAGNOSIS — I110 Hypertensive heart disease with heart failure: Secondary | ICD-10-CM | POA: Diagnosis not present

## 2015-08-29 DIAGNOSIS — E119 Type 2 diabetes mellitus without complications: Secondary | ICD-10-CM | POA: Diagnosis not present

## 2015-08-29 DIAGNOSIS — I482 Chronic atrial fibrillation: Secondary | ICD-10-CM | POA: Diagnosis not present

## 2015-08-29 LAB — POCT INR: INR: 3.1

## 2015-09-05 ENCOUNTER — Ambulatory Visit (INDEPENDENT_AMBULATORY_CARE_PROVIDER_SITE_OTHER): Payer: Medicare Other | Admitting: *Deleted

## 2015-09-05 DIAGNOSIS — I4891 Unspecified atrial fibrillation: Secondary | ICD-10-CM

## 2015-09-05 LAB — POCT INR: INR: 4

## 2015-09-07 ENCOUNTER — Ambulatory Visit (HOSPITAL_COMMUNITY): Payer: Medicare Other | Admitting: Physical Therapy

## 2015-09-07 DIAGNOSIS — R270 Ataxia, unspecified: Secondary | ICD-10-CM

## 2015-09-07 DIAGNOSIS — R29898 Other symptoms and signs involving the musculoskeletal system: Secondary | ICD-10-CM

## 2015-09-07 DIAGNOSIS — Z7409 Other reduced mobility: Secondary | ICD-10-CM

## 2015-09-07 DIAGNOSIS — Z9181 History of falling: Secondary | ICD-10-CM

## 2015-09-07 NOTE — Therapy (Signed)
[facility] [address] [place], [place], [address] Phone: [phone]   Fax:  [phone]  Physical Therapy Treatment  Patient Details  Name: Terry Wu MRN: [id number] Date of Birth: 1932-02-26 Referring Provider: Sinda Du, MD  Encounter Date: 09/07/2015      PT End of Session - 09/07/15 1704    Visit Number 2   Number of Visits 18   Date for PT Re-Evaluation 11/18/15   Authorization Type Medicare   Authorization Time Period G-code at 10th visit, KX at 15th visit   Authorization - Visit Number 2   Authorization - Number of Visits 10   PT Start Time 1600   PT Stop Time 1642   PT Time Calculation (min) 42 min   Equipment Utilized During Treatment Gait belt   Activity Tolerance Patient tolerated treatment well   Behavior During Therapy [facility] for tasks assessed/performed      Past Medical History  Diagnosis Date  . Mixed hyperlipidemia   . Essential hypertension   . Esophageal reflux   . CAD (coronary artery disease)     Multivessel status post CABG 2006  . Aortic regurgitation     Bioprosthetic AVR 2006  . Type 2 diabetes mellitus ([place])   . Aortic aneurysm (HCC)     Ascending thoracic - 4.8 cm  . History of dizziness     Past Surgical History  Procedure Laterality Date  . Coronary artery bypass graft  2006    LIMA to LAD, SVG to diagonal, SVG to OM, SVG to PDA   . Aortic valve replacement  2006    Edwards Pericardial tissue valve  . Total knee arthroplasty Bilateral   . Hernia repair      There were no vitals filed for this visit.  Visit Diagnosis:  Ataxia  Weakness of both lower extremities  Risk for falls  Decreased mobility and endurance      Subjective Assessment - 09/07/15 1615    Subjective Pt states he feels alot better already and stronger.  States he's had no episodes of dizziness or being lightheaded.  Comes today with RW but states he does not want to use it and "feels foolish" with it.   States he is not hurting.                         [facility] PT Treatment/Exercise - 09/07/15 1651    Knee/Hip Exercises: Standing   Heel Raises Both;10 reps   Heel Raises Limitations toeraises 10 reps   Hip Abduction Both;10 reps   Hip Extension Both;10 reps   Gait Training with SPC   Knee/Hip Exercises: Seated   Long Arc Quad Both;10 reps   Sit to [employer] 10 reps;without UE support                PT Education - 09/07/15 1617    Education provided Yes   Education Details reviewed initial evaluation with goals and established HEP.  Educated on gait using [place]   Person(s) Educated Patient   Methods Explanation;Handout   Comprehension Verbalized understanding;Returned demonstration;Verbal cues required;Tactile cues required;Need further instruction          PT Short Term Goals - 09/07/15 1711    PT SHORT TERM GOAL #1   Title Patient to be independent wtih a HEP.   Time 3   Period Weeks   Status On-going   PT SHORT TERM GOAL #2   Title  Patient to ambulate household distances without an assistive device (>50 feet) or loss of balance.    Time 3   Period Weeks   Status Achieved   PT SHORT TERM GOAL #3   Title Patient to report ability to perform static standing tasks at home without UE support.    Time 3   Period Weeks   Status On-going           PT Long Term Goals - 09/07/15 1711    PT LONG TERM GOAL #1   Title Patient to be independent with an advanced HEP for progress upon D/C from PT services.    Time 6   Period Weeks   Status On-going   PT LONG TERM GOAL #2   Title Patient to ambulate X 5 minutes without an assistive device or instability.    Time 6   Period Weeks   Status On-going   PT LONG TERM GOAL #3   Title Patient to ambulate up/down 12 steps with railing for full access to his home.    Time 6   Period Weeks   Status On-going   PT LONG TERM GOAL #4   Title Patient to score greater or equal to 48 on Berg Balance Test for  decreased fall risk.    Time 6   Period Weeks   Status On-going               Plan - 09/07/15 1705    Clinical Impression Statement Reviewed initial evaluation including deficits and goals.  Adjusted walker for height, however patient requests to use SPC (pt has one at home).  Pt able to demonstrate correct gait sequence and safety using SPC.  Instructed to use at all times outdoors and in community.  Pt and spouse verbalized understanding. Unable to print HEP this session due to power outage at clinic, however wrote insttuctions on back of evaluation  Pt able to complete all new exercises with therapist facilitation for correct form and decrease substitiution.     Pt will benefit from skilled therapeutic intervention in order to improve on the following deficits Abnormal gait;Decreased activity tolerance;Decreased balance;Decreased endurance;Decreased safety awareness;Decreased mobility;Difficulty walking;Decreased strength   Rehab Potential Good   PT Frequency 3x / week   PT Duration 6 weeks   PT Treatment/Interventions ADLs/Self Care Home Management;DME Instruction;Gait training;Stair training;Functional mobility training;Therapeutic exercise;Balance training;Neuromuscular re-education;Patient/family education   PT Next Visit Plan Progress therex with focus on balance and return to gait without AD.  Print HEP copy for patient next session.   PT Home Exercise Plan given including heelraises, toeraises, hip abduction/extesion, seated LAQ and sit to stand exercises.    Consulted and Agree with Plan of Care Patient;Family member/caregiver   Family Member Consulted spouse        Problem List Patient Active Problem List   Diagnosis Date Noted  . Gait difficulty 08/22/2015  . Dizziness 08/22/2015  . New onset atrial fibrillation ([place]) 08/22/2015  . Aortic aneurysm ([place]) 02/01/2014  . CAP (community acquired pneumonia) 02/12/2013  . Hyponatremia 02/12/2013  . COPD (chronic  obstructive pulmonary disease) ([place]) 02/12/2013  . Bilateral leg edema 02/12/2013  . DIZZINESS 01/26/2010  . SHORTNESS OF BREATH 10/29/2008  . AODM 07/23/2008  . Mixed hyperlipidemia 07/23/2008  . HYPERTENSION, BENIGN 07/23/2008  . ESOPHAGEAL REFLUX 07/23/2008  . S/P AVR 07/23/2008  . CORONARY ARTERY BYPASS GRAFT, HX OF 07/23/2008    Teena Irani, PTA/CLT [phone]  09/07/2015, 5:12 PM  [facility] [place], [place], [address] Phone: [phone]   Fax:  [phone]  Name: DAILY HUESTON MRN: [id number] Date of Birth: 1932-04-20

## 2015-09-07 NOTE — Patient Instructions (Signed)
Extension standing at sink or counter   Stand, both feet flat. Draw right leg behind body as far as possible. Complete _2__ sets of _10__ repetitions.     ABDUCTION: Standing (Active)    Stand, feet flat. Lift right leg out to side. Complete _2__ sets of _10__ repetitions.   Toe / Heel Raise    Gently rock back on heels and raise toes. Then rock forward on toes and raise heels. Repeat sequence _10___ times per session. Do _2_ sessions     .  KNEE: Extension, Long Arc Quads - Sitting    Raise leg until knee is straight.  Do not lean your body back __10_ reps per set, _2__ sets per day   Functional Quadriceps: Sit to Stand    Sit on edge of chair, feet flat on floor. Stand upright, extending knees fully. Repeat _10___ times per set. Do __2__ sessions per day.

## 2015-09-08 ENCOUNTER — Other Ambulatory Visit (HOSPITAL_COMMUNITY)
Admission: RE | Admit: 2015-09-08 | Discharge: 2015-09-08 | Disposition: A | Discharge status: Home / Self Care | Payer: Medicare Other | Source: Ambulatory Visit | Attending: Physician Assistant | Admitting: Physician Assistant

## 2015-09-08 ENCOUNTER — Encounter: Payer: Self-pay | Admitting: Physician Assistant

## 2015-09-08 ENCOUNTER — Telehealth: Payer: Self-pay

## 2015-09-08 ENCOUNTER — Ambulatory Visit (INDEPENDENT_AMBULATORY_CARE_PROVIDER_SITE_OTHER): Payer: Medicare Other | Admitting: Physician Assistant

## 2015-09-08 VITALS — BP 108/74 | HR 66 | Ht 69.0 in | Wt 227.0 lb

## 2015-09-08 DIAGNOSIS — I4891 Unspecified atrial fibrillation: Secondary | ICD-10-CM

## 2015-09-08 DIAGNOSIS — E876 Hypokalemia: Secondary | ICD-10-CM

## 2015-09-08 DIAGNOSIS — Z79899 Other long term (current) drug therapy: Secondary | ICD-10-CM

## 2015-09-08 DIAGNOSIS — E8770 Fluid overload, unspecified: Secondary | ICD-10-CM | POA: Diagnosis not present

## 2015-09-08 LAB — BASIC METABOLIC PANEL
ANION GAP: 9 (ref 5–15)
BUN: 36 mg/dL — ABNORMAL HIGH (ref 6–20)
CALCIUM: 9.2 mg/dL (ref 8.9–10.3)
CO2: 33 mmol/L — ABNORMAL HIGH (ref 22–32)
CREATININE: 1.23 mg/dL (ref 0.61–1.24)
Chloride: 94 mmol/L — ABNORMAL LOW (ref 101–111)
GFR, EST NON AFRICAN AMERICAN: 53 mL/min — AB (ref >60)
Glucose, Bld: 114 mg/dL — ABNORMAL HIGH (ref 65–99)
Potassium: 3.2 mmol/L — ABNORMAL LOW (ref 3.5–5.1)
Sodium: 136 mmol/L (ref 135–145)

## 2015-09-08 MED ORDER — ATORVASTATIN CALCIUM 20 MG PO TABS: 20.0000 mg | ORAL_TABLET | Freq: Every day | ORAL | Status: DC

## 2015-09-08 MED ORDER — PANTOPRAZOLE SODIUM 40 MG PO TBEC: 40.0000 mg | DELAYED_RELEASE_TABLET | Freq: Every day | ORAL | Status: DC

## 2015-09-08 MED ORDER — CARVEDILOL 12.5 MG PO TABS: 12.5000 mg | ORAL_TABLET | Freq: Two times a day (BID) | ORAL | Status: DC

## 2015-09-08 NOTE — Patient Instructions (Signed)
Your physician recommends that you schedule a follow-up appointment in: 1 month with Dr. Domenic Polite  Your physician has recommended you make the following change in your medication:   Increase Carvedilol 12.5 to One Tablet Two Times Daily   Stop Taking Maxzide (Triamterene-hydrochlorothiazide)  Your physician recommends that you weigh, daily, at the same time every day, and in the same amount of clothing. Please record your daily weights on the handout provided and bring it to your next appointment.  If you need a refill on your cardiac medications before your next appointment, please call your pharmacy.  Thank you for choosing [facility]!

## 2015-09-08 NOTE — Progress Notes (Signed)
Cardiology Office Note   Date:  09/08/2015   ID:  [name], DOB 10-23-1931, MRN [id number]  PCP:  Alonza Bogus, MD  Cardiologist:  Dr [name] >> Dr Gordy Savers, Suanne Marker, PA-C   History of Present Illness: Terry Wu is a 82 y.o. male with a history of CABG (LIMA to LAD, SVG to diagonal, SVG to OM, SVG to PDA) and tissue AVR (11mm Edwards Pericaridal Magna Valve) in 2006, ascending thoracic aneurysm (4.8 cm per CT January 2017), DM, HLD, HTN.  D/c 03/02 after admit for weakness (?TIA), found to have atrial fib, started on Coumadin and continued on Coreg. CHADS2VASC=5.  Terry Wu presents for post-hospital followup.   Since d/c from the hospital, Terry Wu has done well. No bleeding issues on the coumadin. He has had no chest pain, SOB, weakness, presyncope. He has no palpitations and is not aware of the afib. His INR was 4.0 so his dose was decreased, but he is having no bleeding issues and is compliant with meds.   Wants to know if he can come off the coumadin. He has noted some weight loss since being home, LE edema has improved. He has been a little light-headed a few times, orthostatic in nature.   His and his wife want to change MDs to a [place] MD, (no problems with Dr [name], just distance) they like Dr Domenic Polite.    Past Medical History  Diagnosis Date  . Mixed hyperlipidemia   . Essential hypertension   . Esophageal reflux   . CAD (coronary artery disease)     Multivessel status post CABG 2006  . Aortic regurgitation     Bioprosthetic AVR 2006  . Type 2 diabetes mellitus ([place])   . Aortic aneurysm (HCC)     Ascending thoracic - 4.8 cm by CT 2017  . History of dizziness   . Atrial fibrillation with controlled ventricular response ([place]) 08/22/2015    CHADS2VASC=5, on Coumadin    Past Surgical History  Procedure Laterality Date  . Coronary artery bypass graft  2006    LIMA to LAD, SVG to diagonal, SVG to OM, SVG to PDA   . Aortic valve  replacement  2006    Edwards Pericardial tissue valve  . Total knee arthroplasty Bilateral   . Hernia repair      Current Outpatient Prescriptions  Medication Sig Dispense Refill  . amLODipine (NORVASC) 10 MG tablet TAKE ONE TABLET BY MOUTH ONCE DAILY 30 tablet 10  . amoxicillin (AMOXIL) 500 MG capsule Take 2,000 mg by mouth as directed.     [name] atorvastatin (LIPITOR) 20 MG tablet Take 1 tablet (20 mg total) by mouth daily. 90 tablet 3  . Coenzyme Q10 (CO Q 10) 100 MG CAPS Take 1 capsule by mouth every morning.    [name] glimepiride (AMARYL) 2 MG tablet Take 2 mg by mouth daily before breakfast.     . latanoprost (XALATAN) 0.005 % ophthalmic solution Place 1 drop into both eyes at bedtime.    . magnesium oxide (MAG-OX) 400 MG tablet Take 400 mg by mouth daily.    . Multiple Vitamins-Minerals (ICAPS PO) Take 2 tablets by mouth 3 (three) times daily after meals.     . pantoprazole (PROTONIX) 40 MG tablet Take 1 tablet (40 mg total) by mouth daily. 90 tablet 3  . potassium chloride SA (K-DUR,KLOR-CON) 20 MEQ tablet Take 20 mEq by mouth 2 (two) times daily.    [name] telmisartan (MICARDIS) 40 MG  tablet Take 40 mg by mouth daily.    [name] torsemide (DEMADEX) 100 MG tablet Take 100 mg by mouth daily.    [name] warfarin (COUMADIN) 5 MG tablet Take 1 tablet (5 mg total) by mouth once. 30 tablet 12  . carvedilol (COREG) 12.5 MG tablet Take 1 tablet (12.5 mg total) by mouth 2 (two) times daily. 180 tablet 3   No current facility-administered medications for this visit.    Allergies:   Aspirin and Demerol    Social History:  The patient  reports that he has never smoked. He does not have any smokeless tobacco history on file. He reports that he drinks alcohol. He reports that he does not use illicit drugs.   Family History:  The patient's family history includes Heart Problems in his mother; Stroke in his father.    ROS:  Please see the history of present illness. All other systems are reviewed and negative.     PHYSICAL EXAM: VS:  BP 108/74 mmHg  Pulse 66  Ht 5\' 9"  (1.753 m)  Wt 227 lb (102.967 kg)  BMI 33.51 kg/m2  SpO2 98% , BMI Body mass index is 33.51 kg/(m^2). GEN: Well nourished, well developed, male in no acute distress HEENT: normal for age  Neck: no JVD, no carotid bruit, no masses Cardiac: Irreg R&R; no murmur, no rubs, or gallops Respiratory: few rales bases bilaterally, normal work of breathing GI: soft, nontender, nondistended, + BS MS: no deformity or atrophy; no edema; distal pulses are 2+ in all 4 extremities  Skin: warm and dry, no rash Neuro:  Strength and sensation are intact Psych: euthymic mood, full affect   EKG:  EKG is ordered today. The ekg ordered today demonstrates atrial fib, controlled VR   Recent Labs: 08/22/2015: B Natriuretic Peptide 234.0*; TSH 2.224 08/23/2015: ALT 15* 08/25/2015: Hemoglobin 12.1*; Platelets 160 09/08/2015: BUN 36*; Creatinine, Ser 1.23; Potassium 3.2*; Sodium 136    Lipid Panel No results found for: CHOL, TRIG, HDL, CHOLHDL, VLDL, LDLCALC, LDLDIRECT   Wt Readings from Last 3 Encounters:  09/08/15 227 lb (102.967 kg)  08/23/15 243 lb 13.6 oz (110.61 kg)  07/15/15 243 lb 12.8 oz (110.587 kg)     Other studies Reviewed: Additional studies/ records that were reviewed today include: hospital records and office notes.  ASSESSMENT AND PLAN:  1.  Atrial fib: rate is controlled on current rx. Continue this  2. Volume overload: he has been taking triam/HCTZ and Demadex 100 mg daily. Mild orthostatic sx and SBP is a little low. D/c triam/HCTZ. Continue Demadex, ck BMET. May have to increase Kdur or add spironolactone.  3. Chronic anticoag: Coumadin just adjusted to 2.5 mg qd except 5 mg on Sun/Thur. Per coumadin clinic.   Current medicines are reviewed at length with the patient today.  The patient does not have concerns regarding medicines.  The following changes have been made:  D/c triam/HCTZ  Labs/ tests ordered today  include:   Orders Placed This Encounter  Procedures  . Basic Metabolic Panel (BMET)  . EKG 12-Lead     Disposition:   FU with Dr Domenic Polite.  [name]  09/08/2015 4:58 PM    [facility] Phone: [phone]; Fax: [phone]  This note was written with the assistance of speech recognition software. Please excuse any transcriptional errors.

## 2015-09-08 NOTE — Telephone Encounter (Signed)
Patient called no answer.Left message on personal voice mail lab ordered by Rosaria Ferries PA revealed low potassium.Advised to call [facility] office 09/09/15 and ask for a triage nurse to get instructions on new directions for KDur.Message sent to triage.

## 2015-09-09 ENCOUNTER — Telehealth: Payer: Self-pay | Admitting: *Deleted

## 2015-09-09 NOTE — Addendum Note (Signed)
Addended by: Raiford Simmonds on: 09/09/2015 08:57 AM   Modules accepted: Orders

## 2015-09-09 NOTE — Telephone Encounter (Addendum)
Spoke to wife lab ,results given   instruction given ,  Aware to increase k-dur to 40 meq in am  , 20 meq in  Pm Recheck 09/16/15 or 09/19/15 BMP -PLACE ORDER UNDER DR MCDOWELL PATIENT WILL SEE DR MCDOWELL IN A MONTH WILL GO TO LAB AT   VERBALIZED UNDERSTANDING.

## 2015-09-09 NOTE — Telephone Encounter (Signed)
Lab results (BMET) from 09/08/15 review by Rosaria Ferries PA-C. Pt. instructed to take extra dose of potassium (20 meq) today. Decrease Demadex to 50 mg daily and reduce sodium intake. Unable to reach pt at this time.

## 2015-09-12 ENCOUNTER — Encounter (HOSPITAL_COMMUNITY): Payer: Self-pay

## 2015-09-12 ENCOUNTER — Ambulatory Visit (HOSPITAL_COMMUNITY): Payer: Medicare Other

## 2015-09-12 ENCOUNTER — Ambulatory Visit (INDEPENDENT_AMBULATORY_CARE_PROVIDER_SITE_OTHER): Payer: Medicare Other | Admitting: *Deleted

## 2015-09-12 DIAGNOSIS — R270 Ataxia, unspecified: Secondary | ICD-10-CM

## 2015-09-12 DIAGNOSIS — I4891 Unspecified atrial fibrillation: Secondary | ICD-10-CM

## 2015-09-12 DIAGNOSIS — Z7409 Other reduced mobility: Secondary | ICD-10-CM

## 2015-09-12 DIAGNOSIS — Z9181 History of falling: Secondary | ICD-10-CM

## 2015-09-12 DIAGNOSIS — R29898 Other symptoms and signs involving the musculoskeletal system: Secondary | ICD-10-CM

## 2015-09-12 LAB — POCT INR: INR: 2.5

## 2015-09-12 NOTE — Therapy (Signed)
[facility] [address] [place], [place], [address] Phone: [phone]   Fax:  [phone]  Physical Therapy Treatment  Patient Details  Name: Terry Wu MRN: [id number] Date of Birth: 30-Nov-1931 Referring Provider: Sinda Du, MD  Encounter Date: 09/12/2015      PT End of Session - 09/12/15 1526    Visit Number 3   Number of Visits 18   Date for PT Re-Evaluation 11/18/15   Authorization Type Medicare   Authorization Time Period G-code at 10th visit, KX at 15th visit   Authorization - Visit Number 3   Authorization - Number of Visits 10   PT Start Time [id number]   PT Stop Time 1554   PT Time Calculation (min) 37 min   Equipment Utilized During Treatment Gait belt   Activity Tolerance Patient tolerated treatment well   Behavior During Therapy [facility] for tasks assessed/performed      Past Medical History  Diagnosis Date  . Mixed hyperlipidemia   . Essential hypertension   . Esophageal reflux   . CAD (coronary artery disease)     Multivessel status post CABG 2006  . Aortic regurgitation     Bioprosthetic AVR 2006  . Type 2 diabetes mellitus ([place])   . Aortic aneurysm (HCC)     Ascending thoracic - 4.8 cm by CT 2017  . History of dizziness   . Atrial fibrillation with controlled ventricular response ([place]) 08/22/2015    CHADS2VASC=5, on Coumadin    Past Surgical History  Procedure Laterality Date  . Coronary artery bypass graft  2006    LIMA to LAD, SVG to diagonal, SVG to OM, SVG to PDA   . Aortic valve replacement  2006    Edwards Pericardial tissue valve  . Total knee arthroplasty Bilateral   . Hernia repair      There were no vitals filed for this visit.  Visit Diagnosis:  Ataxia  Weakness of both lower extremities  Decreased mobility and endurance  Risk for falls      Subjective Assessment - 09/12/15 1524    Subjective Pt denied falls or pain since last PT visit. No new complaints noted since last PT  visit.    Pertinent History history of bilateral knee replacements and periods of brief dizziness (<30 seconds).   Limitations Walking   How long can you sit comfortably? unlimited   How long can you stand comfortably? unlimited   How long can you walk comfortably? 3-5 minutes   Patient Stated Goals get back to being independent and improve his general strength    Currently in Pain? No/denies                         [facility] Adult PT Treatment/Exercise - 09/12/15 0001    Knee/Hip Exercises: Standing   Heel Raises Both;10 reps;2 sets   Heel Raises Limitations toeraises 10 reps   Hip Flexion Stengthening;Both;2 sets;10 reps   Hip Flexion Limitations with UE support    Hip Abduction Both;10 reps;2 sets   Abduction Limitations with UE support    Hip Extension Both;10 reps;2 sets   Extension Limitations with UE support    Functional Squat 2 sets;10 reps   Functional Squat Limitations with B UE support    Gait Training with SPC indoors on level terrain x 2 minutes with focus on heel to toe sequence and use of SPC  with close supervision    Knee/Hip Exercises: Seated  Long [employer] Both;10 reps                 Balance Exercises - 09/12/15 1535    Balance Exercises: Standing   Standing Eyes Opened Narrow base of support (BOS);Solid surface;3 reps;30 secs   Standing Eyes Closed Narrow base of support (BOS);Solid surface;2 reps;30 secs   SLS Eyes open;3 reps;30 secs;Upper extremity support 1   Sidestepping Other (comment)  8 steps x 3 set, bilaterally with B UE support            PT Education - 09/12/15 1525    Education provided Yes   Education Details Educated pt on current HEP, 5/5 fall precautions, and use of SPC with gait    Person(s) Educated Patient   Methods Explanation;Demonstration;Verbal cues;Handout   Comprehension Verbalized understanding;Returned demonstration          PT Short Term Goals - 09/07/15 1711    PT SHORT TERM GOAL #1   Title  Patient to be independent wtih a HEP.   Time 3   Period Weeks   Status On-going   PT SHORT TERM GOAL #2   Title Patient to ambulate household distances without an assistive device (>50 feet) or loss of balance.    Time 3   Period Weeks   Status Achieved   PT SHORT TERM GOAL #3   Title Patient to report ability to perform static standing tasks at home without UE support.    Time 3   Period Weeks   Status On-going           PT Long Term Goals - 09/07/15 1711    PT LONG TERM GOAL #1   Title Patient to be independent with an advanced HEP for progress upon D/C from PT services.    Time 6   Period Weeks   Status On-going   PT LONG TERM GOAL #2   Title Patient to ambulate X 5 minutes without an assistive device or instability.    Time 6   Period Weeks   Status On-going   PT LONG TERM GOAL #3   Title Patient to ambulate up/down 12 steps with railing for full access to his home.    Time 6   Period Weeks   Status On-going   PT LONG TERM GOAL #4   Title Patient to score greater or equal to 48 on Berg Balance Test for decreased fall risk.    Time 6   Period Weeks   Status On-going               Plan - 09/12/15 1528    Clinical Impression Statement PT tx was focused on standing B LE strengthening ther ex with added set, standing static balance training, and gait training with SPC. Pt was able to tolerate an additional set with standing ther ex with no issues reported or assessed. Occasional seated rest breaks required due to LE fatigue. Added Romberg with EO/EC on level terrain and SLS with UE support. Pt was able to maintain Romberg with EO for 30 seconds, but c/o LE fatigue with seated rest break required. LOB assessed with Romberg on level terrain at 5 seconds. Improved balance assessed with subsequent reps with ability to maintain balance in Romberg with EC for ~9 sec before having to open his eyes. Pt required min assist x 1 on 1 occasion in order to regain his balance. PT  tx was ended sooner this visit per pt request secondary to general fatigue.  Pt denied pain at the end of PT tx with no issues reported other than "feeling tired". Continue with current POC with focus on balance training in standing, LE strengthening, and gait training with SPC.    Pt will benefit from skilled therapeutic intervention in order to improve on the following deficits Abnormal gait;Decreased activity tolerance;Decreased balance;Decreased endurance;Decreased safety awareness;Decreased mobility;Difficulty walking;Decreased strength   Rehab Potential Good   PT Frequency 3x / week   PT Duration 6 weeks   PT Treatment/Interventions ADLs/Self Care Home Management;DME Instruction;Gait training;Stair training;Functional mobility training;Therapeutic exercise;Balance training;Neuromuscular re-education;Patient/family education   PT Next Visit Plan Next visit to focus on standing B LE strengthening ther ex, standing balance training activities, and gait training with/without SPC   PT Home Exercise Plan Reviewed current HEP including given including heelraises, toe raises, hip abduction/extesion, seated LAQ and sit to stand exercises. Handout provided this visit with good understanding demo and verbalized.    Consulted and Agree with Plan of Care Patient        Problem List Patient Active Problem List   Diagnosis Date Noted  . Gait difficulty 08/22/2015  . Dizziness 08/22/2015  . New onset atrial fibrillation ([place]) 08/22/2015  . Aortic aneurysm ([place]) 02/01/2014  . CAP (community acquired pneumonia) 02/12/2013  . Hyponatremia 02/12/2013  . COPD (chronic obstructive pulmonary disease) ([place]) 02/12/2013  . Bilateral leg edema 02/12/2013  . DIZZINESS 01/26/2010  . SHORTNESS OF BREATH 10/29/2008  . AODM 07/23/2008  . Mixed hyperlipidemia 07/23/2008  . HYPERTENSION, BENIGN 07/23/2008  . ESOPHAGEAL REFLUX 07/23/2008  . S/P AVR 07/23/2008  . CORONARY ARTERY BYPASS GRAFT, HX OF 07/23/2008     Garen Lah, PT, DPT   09/12/2015, 4:01 PM  [facility] [address] [place], [place], [address] Phone: [phone]   Fax:  [phone]  Name: JAYVONE DIANE MRN: [id number] Date of Birth: 1931-07-12

## 2015-09-12 NOTE — Telephone Encounter (Signed)
Spoke with pt. Given results and instructions. Patient voiced understanding.

## 2015-09-14 ENCOUNTER — Ambulatory Visit (HOSPITAL_COMMUNITY): Payer: Medicare Other

## 2015-09-14 ENCOUNTER — Telehealth: Payer: Self-pay | Admitting: *Deleted

## 2015-09-14 ENCOUNTER — Other Ambulatory Visit: Payer: Self-pay | Admitting: *Deleted

## 2015-09-14 DIAGNOSIS — R29898 Other symptoms and signs involving the musculoskeletal system: Secondary | ICD-10-CM

## 2015-09-14 DIAGNOSIS — R270 Ataxia, unspecified: Secondary | ICD-10-CM

## 2015-09-14 DIAGNOSIS — Z7409 Other reduced mobility: Secondary | ICD-10-CM

## 2015-09-14 DIAGNOSIS — Z79899 Other long term (current) drug therapy: Secondary | ICD-10-CM

## 2015-09-14 DIAGNOSIS — Z9181 History of falling: Secondary | ICD-10-CM

## 2015-09-14 MED ORDER — SPIRONOLACTONE 25 MG PO TABS: 12.5000 mg | ORAL_TABLET | Freq: Every day | ORAL | Status: DC

## 2015-09-14 MED ORDER — SPIRONOLACTONE 25 MG PO TABS: 25.0000 mg | ORAL_TABLET | Freq: Every day | ORAL | Status: DC

## 2015-09-14 NOTE — Telephone Encounter (Signed)
Bmet in 1 week

## 2015-09-14 NOTE — Therapy (Signed)
[facility] [place], [place], [address] Phone: [phone]   Fax:  [phone]  Physical Therapy Treatment  Patient Details  Name: Terry Wu MRN: [id number] Date of Birth: 1932/03/04 Referring Provider: Sinda Du, MD  Encounter Date: 09/14/2015      PT End of Session - 09/14/15 1454    Visit Number 4   Number of Visits 18   Date for PT Re-Evaluation 11/18/15   Authorization Type Medicare   Authorization Time Period G-code at 10th visit, KX at 15th visit   Authorization - Visit Number 4   Authorization - Number of Visits 10   PT Start Time 1439   PT Stop Time 1517   PT Time Calculation (min) 38 min   Activity Tolerance Patient tolerated treatment well   Behavior During Therapy [facility] for tasks assessed/performed      Past Medical History  Diagnosis Date  . Mixed hyperlipidemia   . Essential hypertension   . Esophageal reflux   . CAD (coronary artery disease)     Multivessel status post CABG 2006  . Aortic regurgitation     Bioprosthetic AVR 2006  . Type 2 diabetes mellitus ([place])   . Aortic aneurysm (HCC)     Ascending thoracic - 4.8 cm by CT 2017  . History of dizziness   . Atrial fibrillation with controlled ventricular response ([place]) 08/22/2015    CHADS2VASC=5, on Coumadin    Past Surgical History  Procedure Laterality Date  . Coronary artery bypass graft  2006    LIMA to LAD, SVG to diagonal, SVG to OM, SVG to PDA   . Aortic valve replacement  2006    Edwards Pericardial tissue valve  . Total knee arthroplasty Bilateral   . Hernia repair      There were no vitals filed for this visit.  Visit Diagnosis:  Ataxia  Weakness of both lower extremities  Decreased mobility and endurance  Risk for falls      Subjective Assessment - 09/14/15 1444    Subjective No updates since last visit. Pt working to get all medications organized better. No pain today.    Pertinent History history of  bilateral knee replacements and periods of brief dizziness (<30 seconds).   Limitations Walking   How long can you sit comfortably? unlimited   How long can you stand comfortably? unlimited   How long can you walk comfortably? 3-5 minutes   Patient Stated Goals get back to being independent and improve his general strength    Currently in Pain? No/denies   Pain Score 0-No pain                         [facility] Adult PT Treatment/Exercise - 09/14/15 0001    Ambulation/Gait   Ambulation/Gait Yes   Ambulation/Gait Assistance 5: Supervision   Ambulation Distance (Feet) 600 Feet  2x300, HR 96 bpm in afib   Assistive device Rolling walker;None   Gait Pattern Step-through pattern   Ambulation Surface Level   Knee/Hip Exercises: Standing   Heel Raises Both;10 reps;2 sets   Heel Raises Limitations toeraises 2x15 reps  heel on 2" box   Hip Flexion Stengthening;Both;2 sets;10 reps   Hip Flexion Limitations with UE support    Hip Abduction Both;10 reps;2 sets   Abduction Limitations with UE support    Hip Extension Both;10 reps;2 sets   Extension Limitations with UE support    Functional Squat 2  sets;10 reps   Functional Squat Limitations with B UE support    Gait Training with SPC indoors on level terrain x 2 with focus on heel to toe sequence and use of SPC  with close supervision    Knee/Hip Exercises: Seated   Long Arc Quad Both;10 reps             Balance Exercises - 09/14/15 1517    Balance Exercises: Standing   Standing Eyes Opened --  overhead doorfram ball tapsx15   Tandem Stance 3 reps;30 secs;Eyes open   Wall Bumps --  standing trunk rotation + ball bumps   Turning 10 reps;Both  standing trunk rotation + ball bumps           PT Education - 09/14/15 1454    Education provided No          PT Short Term Goals - 09/07/15 1711    PT SHORT TERM GOAL #1   Title Patient to be independent wtih a HEP.   Time 3   Period Weeks   Status On-going    PT SHORT TERM GOAL #2   Title Patient to ambulate household distances without an assistive device (>50 feet) or loss of balance.    Time 3   Period Weeks   Status Achieved   PT SHORT TERM GOAL #3   Title Patient to report ability to perform static standing tasks at home without UE support.    Time 3   Period Weeks   Status On-going           PT Long Term Goals - 09/07/15 1711    PT LONG TERM GOAL #1   Title Patient to be independent with an advanced HEP for progress upon D/C from PT services.    Time 6   Period Weeks   Status On-going   PT LONG TERM GOAL #2   Title Patient to ambulate X 5 minutes without an assistive device or instability.    Time 6   Period Weeks   Status On-going   PT LONG TERM GOAL #3   Title Patient to ambulate up/down 12 steps with railing for full access to his home.    Time 6   Period Weeks   Status On-going   PT LONG TERM GOAL #4   Title Patient to score greater or equal to 48 on Berg Balance Test for decreased fall risk.    Time 6   Period Weeks   Status On-going               Plan - 09/14/15 1455    Clinical Impression Statement Pt showing good progress toward gaols, able to add weight to some exercises, and progress the number of reps on others. Pt still limited in stamina and balance more than anything . Pt does endorse feeling sudden onset fatgiue wth prolonged activity.    Pt will benefit from skilled therapeutic intervention in order to improve on the following deficits Abnormal gait;Decreased activity tolerance;Decreased balance;Decreased endurance;Decreased safety awareness;Decreased mobility;Difficulty walking;Decreased strength   Rehab Potential Good   PT Frequency 3x / week   PT Duration 6 weeks   PT Treatment/Interventions ADLs/Self Care Home Management;DME Instruction;Gait training;Stair training;Functional mobility training;Therapeutic exercise;Balance training;Neuromuscular re-education;Patient/family education   PT Next  Visit Plan contninue with strengthening, standing balance training activities, and gait training with/without SPC.    PT Home Exercise Plan no updates.    Consulted and Agree with Plan of Care Patient  Problem List Patient Active Problem List   Diagnosis Date Noted  . Gait difficulty 08/22/2015  . Dizziness 08/22/2015  . New onset atrial fibrillation ([place]) 08/22/2015  . Aortic aneurysm ([place]) 02/01/2014  . CAP (community acquired pneumonia) 02/12/2013  . Hyponatremia 02/12/2013  . COPD (chronic obstructive pulmonary disease) ([place]) 02/12/2013  . Bilateral leg edema 02/12/2013  . DIZZINESS 01/26/2010  . SHORTNESS OF BREATH 10/29/2008  . AODM 07/23/2008  . Mixed hyperlipidemia 07/23/2008  . HYPERTENSION, BENIGN 07/23/2008  . ESOPHAGEAL REFLUX 07/23/2008  . S/P AVR 07/23/2008  . CORONARY ARTERY BYPASS GRAFT, HX OF 07/23/2008    3:19 PM, 09/14/2015 Etta Grandchild, PT, DPT PRN Physical Therapist at [facility] # [id number] (wireless)  [phone] (mobile)       [facility] [place], [place], [address] Phone: [phone]   Fax:  [phone]  Name: Terry Wu MRN: [id number] Date of Birth: 07-29-1931

## 2015-09-16 ENCOUNTER — Ambulatory Visit (HOSPITAL_COMMUNITY): Payer: Medicare Other | Admitting: Physical Therapy

## 2015-09-16 DIAGNOSIS — R270 Ataxia, unspecified: Secondary | ICD-10-CM

## 2015-09-16 DIAGNOSIS — Z7409 Other reduced mobility: Secondary | ICD-10-CM

## 2015-09-16 DIAGNOSIS — R29898 Other symptoms and signs involving the musculoskeletal system: Secondary | ICD-10-CM

## 2015-09-16 DIAGNOSIS — Z9181 History of falling: Secondary | ICD-10-CM

## 2015-09-16 NOTE — Therapy (Signed)
[place], [place], [address] Phone: [phone]   Fax:  [phone]  Physical Therapy Treatment  Patient Details  Name: Terry Wu MRN: [id number] Date of Birth: 1932-04-06 Referring Provider: Sinda Du, MD  Encounter Date: 09/16/2015      PT End of Session - 09/16/15 1602    Visit Number 5   Number of Visits 18   Authorization Type Medicare   Authorization Time Period G-code at 10th visit, KX at 15th visit   Authorization - Visit Number 5   Authorization - Number of Visits 10   PT Start Time 1520   PT Stop Time 1600   PT Time Calculation (min) 40 min   Equipment Utilized During Treatment Gait belt   Activity Tolerance Patient limited by fatigue   Behavior During Therapy [facility] for tasks assessed/performed      Past Medical History  Diagnosis Date  . Mixed hyperlipidemia   . Essential hypertension   . Esophageal reflux   . CAD (coronary artery disease)     Multivessel status post CABG 2006  . Aortic regurgitation     Bioprosthetic AVR 2006  . Type 2 diabetes mellitus ([place])   . Aortic aneurysm (HCC)     Ascending thoracic - 4.8 cm by CT 2017  . History of dizziness   . Atrial fibrillation with controlled ventricular response ([place]) 08/22/2015    CHADS2VASC=5, on Coumadin    Past Surgical History  Procedure Laterality Date  . Coronary artery bypass graft  2006    LIMA to LAD, SVG to diagonal, SVG to OM, SVG to PDA   . Aortic valve replacement  2006    Edwards Pericardial tissue valve  . Total knee arthroplasty Bilateral   . Hernia repair      There were no vitals filed for this visit.  Visit Diagnosis:  Ataxia  Weakness of both lower extremities  Risk for falls  Decreased mobility and endurance      Subjective Assessment - 09/16/15 1527    Subjective Pt states that he just needs to get stronger.    Currently in Pain? No/denies                         [facility]  Adult PT Treatment/Exercise - 09/16/15 0001    Exercises   Exercises Knee/Hip   Knee/Hip Exercises: Standing   Heel Raises Both;10 reps   Side Lunges Both;10 reps   Side Lunges Limitations with 3# in each hand(bicep curls with sidelunge)   Lateral Step Up Right;Left;10 reps;Step Height: 4"   Forward Step Up Both;10 reps;Step Height: 4"   Functional Squat 10 reps   Wall Squat 10 reps   SLS x 5 both LE's    Other Standing Knee Exercises stand at wall with B UE flexion keeping back door    Knee/Hip Exercises: Seated   Long Arc Quad Both;10 reps   Long Arc Quad Weight 4 lbs.   Sit to [employer] 10 reps             Balance Exercises - 09/16/15 1541    Balance Exercises: Standing   Tandem Stance Eyes open;2 reps   SLS Eyes open;5 reps   Sidestepping Other (comment)  8 steps x 3 set, bilaterally with B UE support    Marching Limitations x10             PT Short Term Goals - 09/07/15 1711  PT SHORT TERM GOAL #1   Title Patient to be independent wtih a HEP.   Time 3   Period Weeks   Status On-going   PT SHORT TERM GOAL #2   Title Patient to ambulate household distances without an assistive device (>50 feet) or loss of balance.    Time 3   Period Weeks   Status Achieved   PT SHORT TERM GOAL #3   Title Patient to report ability to perform static standing tasks at home without UE support.    Time 3   Period Weeks   Status On-going           PT Long Term Goals - 09/07/15 1711    PT LONG TERM GOAL #1   Title Patient to be independent with an advanced HEP for progress upon D/C from PT services.    Time 6   Period Weeks   Status On-going   PT LONG TERM GOAL #2   Title Patient to ambulate X 5 minutes without an assistive device or instability.    Time 6   Period Weeks   Status On-going   PT LONG TERM GOAL #3   Title Patient to ambulate up/down 12 steps with railing for full access to his home.    Time 6   Period Weeks   Status On-going   PT LONG TERM GOAL #4    Title Patient to score greater or equal to 48 on Berg Balance Test for decreased fall risk.    Time 6   Period Weeks   Status On-going               Plan - 09/16/15 1603    Clinical Impression Statement Pt states that he feels 100% better but still feels like he needs to work on his strength.  Pt is intolerant to activity needing several small rest breaks throughout treatment.  Therapist notes that although pt feels that strength is his most limiting factor balance is more of a factor at this time.    Pt will benefit from skilled therapeutic intervention in order to improve on the following deficits Abnormal gait;Decreased activity tolerance;Decreased balance;Decreased endurance;Decreased safety awareness;Decreased mobility;Difficulty walking;Decreased strength   PT Next Visit Plan contninue with strengthening, standing balance training activities, and gait training with/without SPC.         Problem List Patient Active Problem List   Diagnosis Date Noted  . Gait difficulty 08/22/2015  . Dizziness 08/22/2015  . New onset atrial fibrillation ([place]) 08/22/2015  . Aortic aneurysm ([place]) 02/01/2014  . CAP (community acquired pneumonia) 02/12/2013  . Hyponatremia 02/12/2013  . COPD (chronic obstructive pulmonary disease) ([place]) 02/12/2013  . Bilateral leg edema 02/12/2013  . DIZZINESS 01/26/2010  . SHORTNESS OF BREATH 10/29/2008  . AODM 07/23/2008  . Mixed hyperlipidemia 07/23/2008  . HYPERTENSION, BENIGN 07/23/2008  . ESOPHAGEAL REFLUX 07/23/2008  . S/P AVR 07/23/2008  . CORONARY ARTERY BYPASS GRAFT, HX OF 07/23/2008  Rayetta Humphrey, PT CLT [phone] 09/16/2015, 4:05 PM  [facility] [address] [place], [place], [address] Phone: [phone]   Fax:  [phone]  Name: Terry Wu MRN: [id number] Date of Birth: Apr 23, 1932

## 2015-09-19 ENCOUNTER — Ambulatory Visit (HOSPITAL_COMMUNITY): Payer: Medicare Other | Admitting: Physical Therapy

## 2015-09-19 DIAGNOSIS — R270 Ataxia, unspecified: Secondary | ICD-10-CM | POA: Diagnosis not present

## 2015-09-19 DIAGNOSIS — R29898 Other symptoms and signs involving the musculoskeletal system: Secondary | ICD-10-CM

## 2015-09-19 DIAGNOSIS — Z7409 Other reduced mobility: Secondary | ICD-10-CM

## 2015-09-19 DIAGNOSIS — Z9181 History of falling: Secondary | ICD-10-CM

## 2015-09-19 NOTE — Therapy (Signed)
[place], [place], [address] Phone: [phone]   Fax:  [phone]  Physical Therapy Treatment  Patient Details  Name: Terry Wu MRN: [id number] Date of Birth: May 02, 1932 Referring Provider: Sinda Du, MD  Encounter Date: 09/19/2015      PT End of Session - 09/19/15 1531    Visit Number 6   Number of Visits 18   Authorization Type Medicare   Authorization Time Period G-code at 10th visit, KX at 15th visit   Authorization - Visit Number 6   Authorization - Number of Visits 10   PT Start Time [id number]   PT Stop Time 1523   PT Time Calculation (min) 38 min   Equipment Utilized During Treatment Gait belt   Activity Tolerance Patient limited by fatigue   Behavior During Therapy [facility] for tasks assessed/performed      Past Medical History  Diagnosis Date  . Mixed hyperlipidemia   . Essential hypertension   . Esophageal reflux   . CAD (coronary artery disease)     Multivessel status post CABG 2006  . Aortic regurgitation     Bioprosthetic AVR 2006  . Type 2 diabetes mellitus ([place])   . Aortic aneurysm (HCC)     Ascending thoracic - 4.8 cm by CT 2017  . History of dizziness   . Atrial fibrillation with controlled ventricular response ([place]) 08/22/2015    CHADS2VASC=5, on Coumadin    Past Surgical History  Procedure Laterality Date  . Coronary artery bypass graft  2006    LIMA to LAD, SVG to diagonal, SVG to OM, SVG to PDA   . Aortic valve replacement  2006    Edwards Pericardial tissue valve  . Total knee arthroplasty Bilateral   . Hernia repair      There were no vitals filed for this visit.  Visit Diagnosis:  Ataxia  Weakness of both lower extremities  Risk for falls  Decreased mobility and endurance      Subjective Assessment - 09/19/15 1500    Subjective PT states he is not hurting anywhere but states his legs get tired and he has to rest often. PT comes today walking with SPC.   Currently in Pain? No/denies                         [facility] Adult PT Treatment/Exercise - 09/19/15 1453    Knee/Hip Exercises: Aerobic   Nustep LE only 5 minutes level 1 no hills   Knee/Hip Exercises: Standing   Heel Raises Both;15 reps   Heel Raises Limitations toeraises 15 reps   Forward Lunges Both;10 reps   Forward Lunges Limitations no UE's onto 4" box   Side Lunges Both;10 reps   Side Lunges Limitations 4" box with 3# in each hand(bicep curls with sidelunge)   Hip Abduction Both;2 sets;15 reps   Abduction Limitations with UE support    Hip Extension Both;2 sets;15 reps   Extension Limitations with UE support    Lateral Step Up Right;Left;10 reps;Step Height: 6";Hand Hold: 1   Lateral Step Up Limitations 6" 1 HHA   Forward Step Up Both;10 reps;Step Height: 6"   Forward Step Up Limitations 6" 1HHA   Functional Squat 15 reps   SLS x 5 both LE's   max of 6" bilaterally without UE assist   Knee/Hip Exercises: Seated   Sit to [place] without UE support;10 reps  PT Short Term Goals - 09/07/15 1711    PT SHORT TERM GOAL #1   Title Patient to be independent wtih a HEP.   Time 3   Period Weeks   Status On-going   PT SHORT TERM GOAL #2   Title Patient to ambulate household distances without an assistive device (>50 feet) or loss of balance.    Time 3   Period Weeks   Status Achieved   PT SHORT TERM GOAL #3   Title Patient to report ability to perform static standing tasks at home without UE support.    Time 3   Period Weeks   Status On-going           PT Long Term Goals - 09/07/15 1711    PT LONG TERM GOAL #1   Title Patient to be independent with an advanced HEP for progress upon D/C from PT services.    Time 6   Period Weeks   Status On-going   PT LONG TERM GOAL #2   Title Patient to ambulate X 5 minutes without an assistive device or instability.    Time 6   Period Weeks   Status On-going   PT LONG TERM GOAL #3    Title Patient to ambulate up/down 12 steps with railing for full access to his home.    Time 6   Period Weeks   Status On-going   PT LONG TERM GOAL #4   Title Patient to score greater or equal to 48 on Berg Balance Test for decreased fall risk.    Time 6   Period Weeks   Status On-going               Plan - 09/19/15 1531    Clinical Impression Statement Pt overall stronger and now ambulating with SPC vs the walker.  PT continues to require frequent seated rest breaks due to fatigue.  Pt encoruaged to increase his activity at home.  Added nustep at end of session (not included in biliing) to encourage increasing activity tolerance.  Progressed reps today with decreased UE assistance.     Pt will benefit from skilled therapeutic intervention in order to improve on the following deficits Abnormal gait;Decreased activity tolerance;Decreased balance;Decreased endurance;Decreased safety awareness;Decreased mobility;Difficulty walking;Decreased strength   PT Next Visit Plan contninue with strengthening, standing balance training activities, and gait training without SPC.         Problem List Patient Active Problem List   Diagnosis Date Noted  . Gait difficulty 08/22/2015  . Dizziness 08/22/2015  . New onset atrial fibrillation ([place]) 08/22/2015  . Aortic aneurysm ([place]) 02/01/2014  . CAP (community acquired pneumonia) 02/12/2013  . Hyponatremia 02/12/2013  . COPD (chronic obstructive pulmonary disease) ([place]) 02/12/2013  . Bilateral leg edema 02/12/2013  . DIZZINESS 01/26/2010  . SHORTNESS OF BREATH 10/29/2008  . AODM 07/23/2008  . Mixed hyperlipidemia 07/23/2008  . HYPERTENSION, BENIGN 07/23/2008  . ESOPHAGEAL REFLUX 07/23/2008  . S/P AVR 07/23/2008  . CORONARY ARTERY BYPASS GRAFT, HX OF 07/23/2008    Teena Irani, PTA/CLT [phone]   09/19/2015, 3:34 PM  [facility] [address] [place], [place], [address] Phone:  [phone]   Fax:  [phone]  Name: Terry Wu MRN: [id number] Date of Birth: March 15, 1932

## 2015-09-21 ENCOUNTER — Encounter (HOSPITAL_COMMUNITY): Payer: Medicare Other

## 2015-09-22 ENCOUNTER — Ambulatory Visit (INDEPENDENT_AMBULATORY_CARE_PROVIDER_SITE_OTHER): Payer: Medicare Other | Admitting: *Deleted

## 2015-09-22 DIAGNOSIS — I4891 Unspecified atrial fibrillation: Secondary | ICD-10-CM | POA: Diagnosis not present

## 2015-09-22 DIAGNOSIS — Z79899 Other long term (current) drug therapy: Secondary | ICD-10-CM | POA: Diagnosis not present

## 2015-09-22 LAB — POCT INR: INR: 2

## 2015-09-23 ENCOUNTER — Ambulatory Visit (HOSPITAL_COMMUNITY): Payer: Medicare Other | Admitting: Physical Therapy

## 2015-09-23 DIAGNOSIS — Z9181 History of falling: Secondary | ICD-10-CM

## 2015-09-23 DIAGNOSIS — Z7409 Other reduced mobility: Secondary | ICD-10-CM

## 2015-09-23 DIAGNOSIS — R29898 Other symptoms and signs involving the musculoskeletal system: Secondary | ICD-10-CM

## 2015-09-23 DIAGNOSIS — R270 Ataxia, unspecified: Secondary | ICD-10-CM

## 2015-09-23 LAB — BASIC METABOLIC PANEL
BUN: 13 mg/dL (ref 7–25)
CHLORIDE: 99 mmol/L (ref 98–110)
CO2: 28 mmol/L (ref 20–31)
CREATININE: 0.97 mg/dL (ref 0.70–1.11)
Calcium: 8.9 mg/dL (ref 8.6–10.3)
Glucose, Bld: 120 mg/dL — ABNORMAL HIGH (ref 65–99)
Potassium: 4.1 mmol/L (ref 3.5–5.3)
Sodium: 137 mmol/L (ref 135–146)

## 2015-09-23 NOTE — Therapy (Signed)
[place], [place], [address] Phone: [phone]   Fax:  [phone]  Physical Therapy Treatment  Patient Details  Name: Terry Wu MRN: [id number] Date of Birth: 07/04/1931 Referring Provider: Sinda Du, MD  Encounter Date: 09/23/2015      PT End of Session - 09/23/15 1544    Visit Number 7   Number of Visits 18   Date for PT Re-Evaluation 11/18/15   Authorization Type Medicare   Authorization Time Period G-code at 10th visit, KX at 15th visit   Authorization - Visit Number 7   Authorization - Number of Visits 10   PT Start Time 1522   PT Stop Time 1600   PT Time Calculation (min) 38 min   Equipment Utilized During Treatment Gait belt   Activity Tolerance Patient tolerated treatment well   Behavior During Therapy [facility] for tasks assessed/performed      Past Medical History  Diagnosis Date  . Mixed hyperlipidemia   . Essential hypertension   . Esophageal reflux   . CAD (coronary artery disease)     Multivessel status post CABG 2006  . Aortic regurgitation     Bioprosthetic AVR 2006  . Type 2 diabetes mellitus ([place])   . Aortic aneurysm (HCC)     Ascending thoracic - 4.8 cm by CT 2017  . History of dizziness   . Atrial fibrillation with controlled ventricular response ([place]) 08/22/2015    CHADS2VASC=5, on Coumadin    Past Surgical History  Procedure Laterality Date  . Coronary artery bypass graft  2006    LIMA to LAD, SVG to diagonal, SVG to OM, SVG to PDA   . Aortic valve replacement  2006    Edwards Pericardial tissue valve  . Total knee arthroplasty Bilateral   . Hernia repair      There were no vitals filed for this visit.  Visit Diagnosis:  Ataxia  Weakness of both lower extremities  Risk for falls  Decreased mobility and endurance      Subjective Assessment - 09/23/15 1531    Subjective Pt states that he feels that he is doing well but he tires easlily    Pertinent  History history of bilateral knee replacements and periods of brief dizziness (<30 seconds).   Currently in Pain? No/denies                         [facility] Adult PT Treatment/Exercise - 09/23/15 0001    Knee/Hip Exercises: Standing   Forward Step Up Both;10 reps;Step Height: 6"   Forward Step Up Limitations 6" 1HHA   Functional Squat 15 reps             Balance Exercises - 09/23/15 1532    Balance Exercises: Standing   Standing Eyes Closed Narrow base of support (BOS);2 reps   Tandem Stance Eyes open;3 reps   SLS Eyes open;5 reps   Standing, One Foot on a Step Eyes open;6 inch;2 reps;30 secs   Sidestepping 2 reps  at mat    Marching Limitations x10   Heel Raises Limitations 10   Toe Raise Limitations 10   Sit to Stand Time 5             PT Short Term Goals - 09/07/15 1711    PT SHORT TERM GOAL #1   Title Patient to be independent wtih a HEP.   Time 3   Period Weeks  Status On-going   PT SHORT TERM GOAL #2   Title Patient to ambulate household distances without an assistive device (>50 feet) or loss of balance.    Time 3   Period Weeks   Status Achieved   PT SHORT TERM GOAL #3   Title Patient to report ability to perform static standing tasks at home without UE support.    Time 3   Period Weeks   Status On-going           PT Long Term Goals - 09/07/15 1711    PT LONG TERM GOAL #1   Title Patient to be independent with an advanced HEP for progress upon D/C from PT services.    Time 6   Period Weeks   Status On-going   PT LONG TERM GOAL #2   Title Patient to ambulate X 5 minutes without an assistive device or instability.    Time 6   Period Weeks   Status On-going   PT LONG TERM GOAL #3   Title Patient to ambulate up/down 12 steps with railing for full access to his home.    Time 6   Period Weeks   Status On-going   PT LONG TERM GOAL #4   Title Patient to score greater or equal to 48 on Berg Balance Test for decreased fall risk.     Time 6   Period Weeks   Status On-going               Plan - 09/23/15 1549    Clinical Impression Statement Pt continues to need frequent rest breaks due to fatigue.  He continues to improve in his balance and strength.  Added new balance exercises including eyes closed to challenge balance.    PT Frequency 3x / week   PT Duration 6 weeks   PT Treatment/Interventions ADLs/Self Care Home Management;DME Instruction;Gait training;Stair training;Functional mobility training;Therapeutic exercise;Balance training;Neuromuscular re-education;Patient/family education   PT Next Visit Plan contninue with strengthening, standing balance training activities, and gait training without SPC.    Consulted and Agree with Plan of Care Patient        Problem List Patient Active Problem List   Diagnosis Date Noted  . Gait difficulty 08/22/2015  . Dizziness 08/22/2015  . New onset atrial fibrillation ([place]) 08/22/2015  . Aortic aneurysm ([place]) 02/01/2014  . CAP (community acquired pneumonia) 02/12/2013  . Hyponatremia 02/12/2013  . COPD (chronic obstructive pulmonary disease) ([place]) 02/12/2013  . Bilateral leg edema 02/12/2013  . DIZZINESS 01/26/2010  . SHORTNESS OF BREATH 10/29/2008  . AODM 07/23/2008  . Mixed hyperlipidemia 07/23/2008  . HYPERTENSION, BENIGN 07/23/2008  . ESOPHAGEAL REFLUX 07/23/2008  . S/P AVR 07/23/2008  . CORONARY ARTERY BYPASS GRAFT, HX OF 07/23/2008   Rayetta Humphrey, PT CLT [phone] 09/23/2015, 4:02 PM  [facility] [address] [place], [place], [address] Phone: [phone]   Fax:  [phone]  Name: AYBEL TUMA MRN: [id number] Date of Birth: 06/08/1932

## 2015-09-26 ENCOUNTER — Telehealth: Payer: Self-pay | Admitting: Cardiovascular Disease

## 2015-09-26 ENCOUNTER — Encounter (HOSPITAL_COMMUNITY): Payer: Medicare Other | Admitting: Physical Therapy

## 2015-09-26 NOTE — Telephone Encounter (Signed)
Called patient back about lab results. Per Dr. [name], labs reviewed and are normal. No changes in medication or F/U needed. BS High. Patient verbalized understanding.

## 2015-09-26 NOTE — Telephone Encounter (Signed)
Follow Up   Called back for Lab results

## 2015-09-28 ENCOUNTER — Encounter (HOSPITAL_COMMUNITY): Payer: Medicare Other | Admitting: Physical Therapy

## 2015-09-30 ENCOUNTER — Encounter (HOSPITAL_COMMUNITY): Payer: Medicare Other | Admitting: Physical Therapy

## 2015-10-03 ENCOUNTER — Encounter (HOSPITAL_COMMUNITY): Payer: Medicare Other

## 2015-10-04 DIAGNOSIS — I482 Chronic atrial fibrillation: Secondary | ICD-10-CM | POA: Diagnosis not present

## 2015-10-04 DIAGNOSIS — I2510 Atherosclerotic heart disease of native coronary artery without angina pectoris: Secondary | ICD-10-CM | POA: Diagnosis not present

## 2015-10-04 DIAGNOSIS — I110 Hypertensive heart disease with heart failure: Secondary | ICD-10-CM | POA: Diagnosis not present

## 2015-10-04 DIAGNOSIS — E119 Type 2 diabetes mellitus without complications: Secondary | ICD-10-CM | POA: Diagnosis not present

## 2015-10-05 ENCOUNTER — Encounter (HOSPITAL_COMMUNITY): Payer: Medicare Other | Admitting: Physical Therapy

## 2015-10-05 ENCOUNTER — Ambulatory Visit (INDEPENDENT_AMBULATORY_CARE_PROVIDER_SITE_OTHER): Payer: Medicare Other | Admitting: *Deleted

## 2015-10-05 DIAGNOSIS — I4891 Unspecified atrial fibrillation: Secondary | ICD-10-CM

## 2015-10-05 LAB — POCT INR: INR: 2.3

## 2015-10-07 ENCOUNTER — Encounter (HOSPITAL_COMMUNITY): Payer: Medicare Other

## 2015-10-10 ENCOUNTER — Ambulatory Visit (HOSPITAL_COMMUNITY): Payer: Medicare Other | Attending: Pulmonary Disease | Admitting: Physical Therapy

## 2015-10-10 DIAGNOSIS — R2681 Unsteadiness on feet: Secondary | ICD-10-CM | POA: Insufficient documentation

## 2015-10-10 DIAGNOSIS — R2689 Other abnormalities of gait and mobility: Secondary | ICD-10-CM | POA: Diagnosis not present

## 2015-10-10 DIAGNOSIS — M6281 Muscle weakness (generalized): Secondary | ICD-10-CM | POA: Diagnosis not present

## 2015-10-10 NOTE — Therapy (Signed)
[facility] [address] [place], [place], [address] Phone: [phone]   Fax:  [phone]  Physical Therapy Treatment  Patient Details  Name: Terry Wu MRN: [id number] Date of Birth: September 24, 1931 Referring Provider: Sinda Du   Encounter Date: 10/10/2015      PT End of Session - 10/10/15 1426    Visit Number 8   Number of Visits 8   Date for PT Re-Evaluation 11/18/15   Authorization Type Medicare   Authorization - Visit Number 8   Authorization - Number of Visits 8   PT Start Time 8938   PT Stop Time 1425   PT Time Calculation (min) 40 min   Activity Tolerance Patient tolerated treatment well   Behavior During Therapy [facility] for tasks assessed/performed      Past Medical History  Diagnosis Date  . Mixed hyperlipidemia   . Essential hypertension   . Esophageal reflux   . CAD (coronary artery disease)     Multivessel status post CABG 2006  . Aortic regurgitation     Bioprosthetic AVR 2006  . Type 2 diabetes mellitus ([place])   . Aortic aneurysm (HCC)     Ascending thoracic - 4.8 cm by CT 2017  . History of dizziness   . Atrial fibrillation with controlled ventricular response ([place]) 08/22/2015    CHADS2VASC=5, on Coumadin    Past Surgical History  Procedure Laterality Date  . Coronary artery bypass graft  2006    LIMA to LAD, SVG to diagonal, SVG to OM, SVG to PDA   . Aortic valve replacement  2006    Edwards Pericardial tissue valve  . Total knee arthroplasty Bilateral   . Hernia repair      There were no vitals filed for this visit.      Subjective Assessment - 10/10/15 1348    Subjective Pt has not been to therapy since 09/23/2015.  [place]  Pt states he feels fine; he has been doing alot of walking.    Pertinent History history of bilateral knee replacements and periods of brief dizziness (<30 seconds).   Limitations Walking   How long can you sit  comfortably? unlimited   How long can you stand comfortably? unlimited   How long can you walk comfortably? Pt is able to walk 3-5 minutes was 3-5 minutes.    Patient Stated Goals get back to being independent and improve his general strength    Currently in Pain? No/denies            [facility] PT Assessment - 10/10/15 0001    Assessment   Medical Diagnosis ataxia   Referring Provider Sinda Du    Onset Date/Surgical Date 08/22/15   Next MD Visit 08/29/15   Prior Therapy due to bilateral TKA   Precautions   Precautions Fall   Balance Screen   Has the patient fallen in the past 6 months No   Has the patient had a decrease in activity level because of a fear of falling?  Yes   [place] Private residence   Living Arrangements Spouse/significant other   Home Layout Two level;Able to live on main level with bedroom/bathroom   Home Equipment Walker - standard;Cane - single point   Prior Function   Level of Independence Needs assistance with ADLs   Cognition   Overall Cognitive Status Within Functional Limits for tasks assessed  Observation/Other Assessments   Observations mild side bend right   Sensation   Light Touch Appears Intact   Strength   Right Hip Flexion 4-/5  was 4-/5    Right Hip ABduction 4/5  was 4-/5    Right Hip ADduction 4/5   Left Hip Flexion 4-/5  was 4-/5   Left Hip ABduction 4/5  was 4-/5    Left Hip ADduction 4/5   Right/Left Knee Right;Left   Right Knee Flexion 4+/5   Right Knee Extension 5/5  was 4+/5    Left Knee Flexion 4+/5   Left Knee Extension 5/5   Right Ankle Dorsiflexion 5/5   Right Ankle Plantar Flexion 5/5   Left Ankle Dorsiflexion 5/5   Left Ankle Plantar Flexion 5/5   Ambulation/Gait   Ambulation/Gait Yes   Ambulation/Gait Assistance 5: Supervision   Ambulation Distance (Feet) --  X2   Assistive device Rolling walker;None   Gait Pattern Step-through pattern   Gait Comments Pt is ambulating with a  cane for long distances    Berg Balance Test   Sit to Stand Able to stand without using hands and stabilize independently   Standing Unsupported Able to stand safely 2 minutes   Sitting with Back Unsupported but Feet Supported on Floor or Stool Able to sit safely and securely 2 minutes   Stand to Sit Sits safely with minimal use of hands   Transfers Able to transfer safely, minor use of hands   Standing Unsupported with Eyes Closed Able to stand 10 seconds with supervision   Standing Ubsupported with Feet Together Able to place feet together independently and stand for 1 minute with supervision   From Standing, Reach Forward with Outstretched Arm Can reach forward >12 cm safely (5")   From Standing Position, Pick up Object from Floor Able to pick up shoe safely and easily   From Standing Position, Turn to Look Behind Over each Shoulder Looks behind one side only/other side shows less weight shift   Turn 360 Degrees Able to turn 360 degrees safely one side only in 4 seconds or less   Standing Unsupported, Alternately Place Feet on Step/Stool Able to stand independently and safely and complete 8 steps in 20 seconds   Standing Unsupported, One Foot in Front Able to plae foot ahead of the other independently and hold 30 seconds   Standing on One Leg Tries to lift leg/unable to hold 3 seconds but remains standing independently   Total Score 47   Berg comment: Pt was at a 38.  Encouraged pt to use cane at all times                              PT Education - 10/10/15 1426    Education provided Yes   Education Details HEP   Person(s) Educated Patient   Methods Explanation;Demonstration;Handout   Comprehension Verbalized understanding;Returned demonstration          PT Short Term Goals - 10/10/15 1411    PT SHORT TERM GOAL #1   Title Patient to be independent wtih a HEP.   Time 3   Period Weeks   Status Achieved   PT SHORT TERM GOAL #2   Title Patient to ambulate  household distances without an assistive device (>50 feet) or loss of balance.    Time 3   Period Weeks   Status Achieved   PT SHORT TERM GOAL #3   Title  Patient to report ability to perform static standing tasks at home without UE support.    Time 3   Period Weeks   Status Achieved           PT Long Term Goals - Oct 25, 2015 1412    PT LONG TERM GOAL #1   Title Patient to be independent with an advanced HEP for progress upon D/C from PT services.    Time 6   Period Weeks   Status Achieved   PT LONG TERM GOAL #2   Title Patient to ambulate X 5 minutes without an assistive device or instability.    Time 6   Period Weeks   Status Achieved   PT LONG TERM GOAL #3   Title Patient to ambulate up/down 12 steps with railing for full access to his home.    Time 6   Period Weeks   Status Achieved   PT LONG TERM GOAL #4   Title Patient to score greater or equal to 48 on Berg Balance Test for decreased fall risk.    Time 6   Period Weeks   Status Achieved               Plan - 25-Oct-2015 1427    Clinical Impression Statement Pt reassessed.  Pt has improved slightly in his strength but balance has improved significantly.  Pt feels that he is ready to be discharge.  Pt main limitation at this time is endurance but therapist believes this is more of a cardiac condition.  Pt is clear on his home exercise program .     Rehab Potential Good   PT Treatment/Interventions Therapeutic activities;Therapeutic exercise   PT Next Visit Plan Pt to be discharge to [place] no updates.       Patient will benefit from skilled therapeutic intervention in order to improve the following deficits and impairments:  Abnormal gait, Decreased activity tolerance, Decreased balance, Decreased endurance, Decreased safety awareness, Decreased mobility, Difficulty walking, Decreased strength  Visit Diagnosis: Other abnormalities of gait and mobility - Plan: PT plan of care  cert/re-cert  Unsteadiness on feet - Plan: PT plan of care cert/re-cert  Muscle weakness (generalized) - Plan: PT plan of care cert/re-cert       G-Codes - 10/25/2015 1519    Functional Assessment Tool Used BERG, clinical judgment   Functional Limitation Mobility: Walking and moving around   Mobility: Walking and Moving Around Goal Status [phone]) At least 1 percent but less than 20 percent impaired, limited or restricted   Mobility: Walking and Moving Around Discharge Status [phone]) At least 20 percent but less than 40 percent impaired, limited or restricted      Problem List Patient Active Problem List   Diagnosis Date Noted  . Gait difficulty 08/22/2015  . Dizziness 08/22/2015  . New onset atrial fibrillation ([place]) 08/22/2015  . Aortic aneurysm ([place]) 02/01/2014  . CAP (community acquired pneumonia) 02/12/2013  . Hyponatremia 02/12/2013  . COPD (chronic obstructive pulmonary disease) ([place]) 02/12/2013  . Bilateral leg edema 02/12/2013  . DIZZINESS 01/26/2010  . SHORTNESS OF BREATH 10/29/2008  . AODM 07/23/2008  . Mixed hyperlipidemia 07/23/2008  . HYPERTENSION, BENIGN 07/23/2008  . ESOPHAGEAL REFLUX 07/23/2008  . S/P AVR 07/23/2008  . CORONARY ARTERY BYPASS GRAFT, HX OF 07/23/2008    Rayetta Humphrey, PT CLT [phone] 10-25-15, 3:22 PM  [facility] [address] [place], [place], [address] Phone: [phone]  Fax:  [phone]  Name: OLAWALE MARNEY MRN: [id number] Date of Birth: 17-Apr-1932 PHYSICAL THERAPY DISCHARGE SUMMARY  Visits from Start of Care: 8  Current functional level related to goals / functional outcomes: See above    Remaining deficits: endurance   Education / Equipment: HEP  Plan: Patient agrees to discharge.  Patient goals were not met. Patient is being discharged due to meeting the stated rehab goals.  ?????        Rayetta Humphrey, Bernie CLT [phone]

## 2015-10-10 NOTE — Patient Instructions (Signed)
GENERAL GUIDELINES: NEUTRAL SPINE  NEUTRAL SPINE is the position of the spine when the ears, shoulders, hips, and ankles are vertically aligned. This position can be achieved by standing with heels 1" - 2 " from a wall with head, shoulder blades, and buttocks against the wall. The natural curvatures of the spine in this position are your NEUTRAL alignment. Postural defects might prevent this degree of alignment. When performing the exercises, attempt to remain as close as possible to this Neutral Spine.   Copyright  VHI. All rights reserved.  Feet Partial Heel-Toe (Compliant Surface) Arm Motion - Eyes Open    With eyes open, standing on floor : ___Place_____, right foot partially in front of the other, move arms up and down: to front. Repeat _10___ times per session. Do ___1_ sessions per day.  Copyright  VHI. All rights reserved.  Toe Raise (Standing)   Holding onto a counter Rock back on heels. Repeat _10___ times per set. Do _1___ sets per session. Do 2____ sessions per day.  http://orth.exer.us/42   Copyright  VHI. All rights reserved.  Heel Raise: Bilateral (Standing)    Rise on balls of feet. Repeat __10__ times per set. Do _1___ sets per session. Do ___2_ sessions per day.  http://orth.exer.us/38   Copyright  VHI. All rights reserved.  Balance: Unilateral    Attempt to balance on left leg, eyes open. Hold __3__ seconds. Repeat __3__ times per set. Do __2__ sets per session. Do _2___ sessions per day. Perform exercise with eyes closed.2  http://orth.exer.us/28   Copyright  VHI. All rights reserved.  Functional Quadriceps: Chair Squat    Keeping feet flat on floor, shoulder width apart, squat as low as is comfortable. Use support as necessary. Repeat __10__ times per set. Do _1___ sets per session. Do __2__ sessions per day.  http://orth.exer.us/736   Copyright  VHI. All rights reserved.

## 2015-10-12 ENCOUNTER — Ambulatory Visit (HOSPITAL_COMMUNITY): Payer: Medicare Other | Admitting: Physical Therapy

## 2015-10-13 ENCOUNTER — Ambulatory Visit (INDEPENDENT_AMBULATORY_CARE_PROVIDER_SITE_OTHER): Payer: Medicare Other | Admitting: Cardiology

## 2015-10-13 ENCOUNTER — Encounter: Payer: Self-pay | Admitting: Cardiology

## 2015-10-13 VITALS — BP 116/74 | HR 89 | Ht 69.0 in | Wt 231.0 lb

## 2015-10-13 DIAGNOSIS — Z954 Presence of other heart-valve replacement: Secondary | ICD-10-CM | POA: Diagnosis not present

## 2015-10-13 DIAGNOSIS — I481 Persistent atrial fibrillation: Secondary | ICD-10-CM

## 2015-10-13 DIAGNOSIS — I4819 Other persistent atrial fibrillation: Secondary | ICD-10-CM

## 2015-10-13 DIAGNOSIS — I10 Essential (primary) hypertension: Secondary | ICD-10-CM | POA: Diagnosis not present

## 2015-10-13 DIAGNOSIS — I2510 Atherosclerotic heart disease of native coronary artery without angina pectoris: Secondary | ICD-10-CM

## 2015-10-13 DIAGNOSIS — Z953 Presence of xenogenic heart valve: Secondary | ICD-10-CM

## 2015-10-13 NOTE — Progress Notes (Signed)
Cardiology Office Note  Date: 10/13/2015   ID: CACE ORLOV, DOB [birth year], MRN [id number]  PCP: Alonza Bogus, MD  Primary Cardiologist: Rozann Lesches, MD   Chief Complaint  Patient presents with  . Coronary Artery Disease  . Atrial Fibrillation    History of Present Illness: [name] is an 83 y.o. male former long-term patient Dr. [name], now establishing office follow-up with me. He was most recently seen by Ms. Barrett PA-C in March. He is here today with his wife for a follow-up visit. I am familiar with his history, saw him as an inpatient consultation recently. He is doing well at this point, stable weights, no palpitations or chest pain.  He continues on Coumadin with follow-up in the anticoagulation clinic, recent INR 2.3. No bleeding problems reported.  I reviewed his remaining medications. Cardiac regimen includes Norvasc, Lipitor, Coreg, Coumadin, spironolactone, potassium, Micardis, and Demadex.  I went over his recent lab work as well, renal function and potassium were normal.  Past Medical History  Diagnosis Date  . Mixed hyperlipidemia   . Essential hypertension   . Esophageal reflux   . CAD (coronary artery disease)     Multivessel status post CABG 2006  . Aortic regurgitation     Bioprosthetic AVR 2006  . Type 2 diabetes mellitus ([place])   . Aortic aneurysm (HCC)     Ascending thoracic - 4.8 cm by CT 2017  . History of dizziness   . Atrial fibrillation with controlled ventricular response ([place]) 08/22/2015    CHADS2VASC=5, on Coumadin    Past Surgical History  Procedure Laterality Date  . Coronary artery bypass graft  2006    LIMA to LAD, SVG to diagonal, SVG to OM, SVG to PDA   . Aortic valve replacement  2006    Edwards Pericardial tissue valve  . Total knee arthroplasty Bilateral   . Hernia repair      Current Outpatient Prescriptions  Medication Sig Dispense Refill  . amLODipine (NORVASC) 10 MG tablet TAKE ONE TABLET BY MOUTH  ONCE DAILY 30 tablet 10  . amoxicillin (AMOXIL) 500 MG capsule Take 2,000 mg by mouth as directed.     [name] atorvastatin (LIPITOR) 20 MG tablet Take 1 tablet (20 mg total) by mouth daily. 90 tablet 3  . carvedilol (COREG) 12.5 MG tablet Take 1 tablet (12.5 mg total) by mouth 2 (two) times daily. 180 tablet 3  . Coenzyme Q10 (CO Q 10) 100 MG CAPS Take 1 capsule by mouth every morning.    [name] glimepiride (AMARYL) 2 MG tablet Take 2 mg by mouth daily before breakfast.     . latanoprost (XALATAN) 0.005 % ophthalmic solution Place 1 drop into both eyes at bedtime.    . magnesium oxide (MAG-OX) 400 MG tablet Take 400 mg by mouth daily.    . Multiple Vitamins-Minerals (ICAPS PO) Take 2 tablets by mouth 3 (three) times daily after meals.     . pantoprazole (PROTONIX) 40 MG tablet Take 1 tablet (40 mg total) by mouth daily. 90 tablet 3  . potassium chloride SA (K-DUR,KLOR-CON) 20 MEQ tablet Take 20 mEq by mouth daily.     [name] spironolactone (ALDACTONE) 25 MG tablet Take 0.5 tablets (12.5 mg total) by mouth daily. 45 tablet 3  . telmisartan (MICARDIS) 40 MG tablet Take 40 mg by mouth daily.    [name] torsemide (DEMADEX) 100 MG tablet Take 50 mg by mouth daily.     [name] warfarin (COUMADIN) 5 MG  tablet Take 1 tablet (5 mg total) by mouth once. 30 tablet 12   No current facility-administered medications for this visit.   Allergies:  Aspirin and Demerol   Social History: The patient  reports that he has never smoked. He does not have any smokeless tobacco history on file. He reports that he drinks alcohol. He reports that he does not use illicit drugs.   Family History: The patient's family history includes Heart Problems in his mother; Stroke in his father.   ROS:  Please see the history of present illness. Otherwise, complete review of systems is positive for occasional sinus drainage and congestion.  All other systems are reviewed and negative.   Physical Exam: VS:  BP 116/74 mmHg  Pulse 89  Ht 5\' 9"  (1.753 m)   Wt 231 lb (104.781 kg)  BMI 34.10 kg/m2  SpO2 93%, BMI Body mass index is 34.1 kg/(m^2).  Wt Readings from Last 3 Encounters:  10/13/15 231 lb (104.781 kg)  09/08/15 227 lb (102.967 kg)  08/23/15 243 lb 13.6 oz (110.61 kg)    General: Overweight male, appears comfortable at rest. HEENT: Conjunctiva and lids normal, oropharynx clear. Neck: Supple, no elevated JVP or carotid bruits, no thyromegaly. Lungs: Clear, nonlabored breathing at rest. Cardiac: Irregularly irregular, no S3, soft systolic murmur, no pericardial rub. Abdomen: Soft, nontender, bowel sounds present, no guarding or rebound. Extremities: Chronic appearing but improved leg edema with stasis, distal pulses 2+. Skin: Warm and dry. Musculoskeletal: No kyphosis. Neuropsychiatric: Alert and oriented x3, affect grossly appropriate.  ECG: I personally reviewed the prior tracing from 09/08/2015 which showed rate-controlled atrial fibrillation with nonspecific ST changes.  Recent Labwork: 08/22/2015: B Natriuretic Peptide 234.0*; TSH 2.224 08/23/2015: ALT 15*; AST 15 08/25/2015: Hemoglobin 12.1*; Platelets 160 09/22/2015: BUN 13; Creat 0.97; Potassium 4.1; Sodium 137   Other Studies Reviewed Today:  Echocardiogram 08/23/2015: Study Conclusions  - Left ventricle: The cavity size was normal. Wall thickness was  increased in a pattern of mild LVH. Systolic function was normal.  The estimated ejection fraction was in the range of 60% to 65%.  Wall motion was normal; there were no regional wall motion  abnormalities. - Aortic valve: 25 mm Edwards pericardial Magna prosthesis in  aortic position. No obvious perivalvular leak. Leaflet motion is  normal. There was no significant regurgitation. Mean gradient  (S): 10 mm Hg. Peak gradient (S): 17 mm Hg. VTI ratio of LVOT to  aortic valve: 0.61. Valve area (Vmax): 1.83 cm^2. Valve area  (Vmean): 1.63 cm^2. - Aortic root: The aortic root was mildly to moderately dilated. -  Mitral valve: Calcified annulus. Mildly thickened leaflets .  There was trivial regurgitation. - Left atrium: The atrium was moderately to severely dilated. - Right atrium: The atrium was moderately to severely dilated.  Central venous pressure (est): 8 mm Hg. - Tricuspid valve: There was trivial regurgitation. - Pulmonary arteries: Systolic pressure was moderately increased.  PA peak pressure: 51 mm Hg (S). - Pericardium, extracardiac: There was no pericardial effusion.  Impressions:  - Mild LVH with LVEF 60-65%, indeterminate diastolic function in  the setting of atrial fibrillation. Moderate to severe biatrial  enlargement. MAC with mildly thickened mitral leaflets and  trivial mitral regurgitation. Bioprosthetic aortic valve shows  grossly normal function as detailed above. Trivial tricuspid  regurgitation with evidence of moderate pulmonary hypertension,  PASP estimated 51 mmHg.  Assessment and Plan:  1. Persistent atrial fibrillation, symptomatically well controlled on current regimen. He continues on Coumadin for  stroke prophylaxis and has adequate heart rate control Coreg.  2. Essential hypertension, blood pressure is normal on current medications.  3. Multivessel CAD status post CABG, no active angina symptoms. LVEF 60-65%.  4. History of aortic regurgitation status post bioprosthetic AVR 2006, stable function by recent echocardiogram.  Current medicines were reviewed with the patient today.  Disposition: FU with me in 6 months.   Signed, Satira Sark, MD, [facility] 10/13/2015 10:17 AM    [facility] [place]. [address], [place], [place] [address] Phone: [phone]; Fax: [phone]

## 2015-10-13 NOTE — Patient Instructions (Signed)
Your physician wants you to follow-up in: 6 months with Dr McDowell You will receive a reminder letter in the mail two months in advance. If you don't receive a letter, please call our office to schedule the follow-up appointment.     Your physician recommends that you continue on your current medications as directed. Please refer to the Current Medication list given to you today.    If you need a refill on your cardiac medications before your next appointment, please call your pharmacy.     Thank you for choosing  [facility] !        

## 2015-10-14 ENCOUNTER — Ambulatory Visit (HOSPITAL_COMMUNITY): Payer: Medicare Other | Admitting: Physical Therapy

## 2015-10-24 DIAGNOSIS — H401132 Primary open-angle glaucoma, bilateral, moderate stage: Secondary | ICD-10-CM | POA: Diagnosis not present

## 2015-10-26 ENCOUNTER — Ambulatory Visit (INDEPENDENT_AMBULATORY_CARE_PROVIDER_SITE_OTHER): Payer: Medicare Other | Admitting: *Deleted

## 2015-10-26 DIAGNOSIS — I4891 Unspecified atrial fibrillation: Secondary | ICD-10-CM | POA: Diagnosis not present

## 2015-10-26 LAB — POCT INR: INR: 1.8

## 2015-11-01 DIAGNOSIS — H401123 Primary open-angle glaucoma, left eye, severe stage: Secondary | ICD-10-CM | POA: Diagnosis not present

## 2015-11-01 DIAGNOSIS — H40001 Preglaucoma, unspecified, right eye: Secondary | ICD-10-CM | POA: Diagnosis not present

## 2015-11-01 DIAGNOSIS — E119 Type 2 diabetes mellitus without complications: Secondary | ICD-10-CM | POA: Diagnosis not present

## 2015-11-03 ENCOUNTER — Telehealth: Payer: Self-pay | Admitting: Cardiology

## 2015-11-03 NOTE — Telephone Encounter (Signed)
Pt called to give med change

## 2015-11-03 NOTE — Telephone Encounter (Signed)
Pt has had a change in medicine, pls call pt

## 2015-11-09 ENCOUNTER — Ambulatory Visit (INDEPENDENT_AMBULATORY_CARE_PROVIDER_SITE_OTHER): Payer: Medicare Other | Admitting: *Deleted

## 2015-11-09 DIAGNOSIS — I4891 Unspecified atrial fibrillation: Secondary | ICD-10-CM

## 2015-11-09 LAB — POCT INR: INR: 2.2

## 2015-12-06 DIAGNOSIS — H401123 Primary open-angle glaucoma, left eye, severe stage: Secondary | ICD-10-CM | POA: Diagnosis not present

## 2015-12-06 DIAGNOSIS — H40001 Preglaucoma, unspecified, right eye: Secondary | ICD-10-CM | POA: Diagnosis not present

## 2015-12-07 ENCOUNTER — Ambulatory Visit (INDEPENDENT_AMBULATORY_CARE_PROVIDER_SITE_OTHER): Payer: Medicare Other | Admitting: *Deleted

## 2015-12-07 DIAGNOSIS — I4891 Unspecified atrial fibrillation: Secondary | ICD-10-CM | POA: Diagnosis not present

## 2015-12-07 LAB — POCT INR: INR: 2.1

## 2016-01-02 DIAGNOSIS — E119 Type 2 diabetes mellitus without complications: Secondary | ICD-10-CM | POA: Diagnosis not present

## 2016-01-02 DIAGNOSIS — J449 Chronic obstructive pulmonary disease, unspecified: Secondary | ICD-10-CM | POA: Diagnosis not present

## 2016-01-02 DIAGNOSIS — I10 Essential (primary) hypertension: Secondary | ICD-10-CM | POA: Diagnosis not present

## 2016-01-02 DIAGNOSIS — I2510 Atherosclerotic heart disease of native coronary artery without angina pectoris: Secondary | ICD-10-CM | POA: Diagnosis not present

## 2016-01-04 ENCOUNTER — Ambulatory Visit (INDEPENDENT_AMBULATORY_CARE_PROVIDER_SITE_OTHER): Payer: Medicare Other | Admitting: Pharmacist

## 2016-01-04 DIAGNOSIS — I4891 Unspecified atrial fibrillation: Secondary | ICD-10-CM

## 2016-01-04 LAB — POCT INR: INR: 2.3

## 2016-01-05 DIAGNOSIS — I10 Essential (primary) hypertension: Secondary | ICD-10-CM | POA: Diagnosis not present

## 2016-01-05 DIAGNOSIS — E119 Type 2 diabetes mellitus without complications: Secondary | ICD-10-CM | POA: Diagnosis not present

## 2016-01-05 DIAGNOSIS — I2510 Atherosclerotic heart disease of native coronary artery without angina pectoris: Secondary | ICD-10-CM | POA: Diagnosis not present

## 2016-01-05 DIAGNOSIS — Z125 Encounter for screening for malignant neoplasm of prostate: Secondary | ICD-10-CM | POA: Diagnosis not present

## 2016-01-05 DIAGNOSIS — J449 Chronic obstructive pulmonary disease, unspecified: Secondary | ICD-10-CM | POA: Diagnosis not present

## 2016-01-30 ENCOUNTER — Ambulatory Visit (INDEPENDENT_AMBULATORY_CARE_PROVIDER_SITE_OTHER): Payer: Medicare Other | Admitting: *Deleted

## 2016-01-30 DIAGNOSIS — I4891 Unspecified atrial fibrillation: Secondary | ICD-10-CM | POA: Diagnosis not present

## 2016-01-30 LAB — POCT INR: INR: 2.4

## 2016-02-03 ENCOUNTER — Other Ambulatory Visit: Payer: Self-pay | Admitting: Cardiovascular Disease

## 2016-03-06 ENCOUNTER — Other Ambulatory Visit: Payer: Self-pay | Admitting: Cardiovascular Disease

## 2016-03-12 ENCOUNTER — Ambulatory Visit (INDEPENDENT_AMBULATORY_CARE_PROVIDER_SITE_OTHER): Payer: Medicare Other | Admitting: *Deleted

## 2016-03-12 DIAGNOSIS — I4891 Unspecified atrial fibrillation: Secondary | ICD-10-CM | POA: Diagnosis not present

## 2016-03-12 LAB — POCT INR: INR: 2.8

## 2016-03-13 DIAGNOSIS — H401123 Primary open-angle glaucoma, left eye, severe stage: Secondary | ICD-10-CM | POA: Diagnosis not present

## 2016-03-13 DIAGNOSIS — H40001 Preglaucoma, unspecified, right eye: Secondary | ICD-10-CM | POA: Diagnosis not present

## 2016-03-13 DIAGNOSIS — H353130 Nonexudative age-related macular degeneration, bilateral, stage unspecified: Secondary | ICD-10-CM | POA: Diagnosis not present

## 2016-04-03 DIAGNOSIS — E119 Type 2 diabetes mellitus without complications: Secondary | ICD-10-CM | POA: Diagnosis not present

## 2016-04-03 DIAGNOSIS — I10 Essential (primary) hypertension: Secondary | ICD-10-CM | POA: Diagnosis not present

## 2016-04-03 DIAGNOSIS — J449 Chronic obstructive pulmonary disease, unspecified: Secondary | ICD-10-CM | POA: Diagnosis not present

## 2016-04-03 DIAGNOSIS — Z23 Encounter for immunization: Secondary | ICD-10-CM | POA: Diagnosis not present

## 2016-04-03 DIAGNOSIS — I110 Hypertensive heart disease with heart failure: Secondary | ICD-10-CM | POA: Diagnosis not present

## 2016-04-10 ENCOUNTER — Ambulatory Visit: Payer: Medicare Other | Admitting: Cardiology

## 2016-04-23 ENCOUNTER — Encounter: Payer: Self-pay | Admitting: Cardiology

## 2016-04-23 ENCOUNTER — Ambulatory Visit (INDEPENDENT_AMBULATORY_CARE_PROVIDER_SITE_OTHER): Payer: Medicare Other | Admitting: Cardiology

## 2016-04-23 ENCOUNTER — Ambulatory Visit (INDEPENDENT_AMBULATORY_CARE_PROVIDER_SITE_OTHER): Payer: Medicare Other | Admitting: *Deleted

## 2016-04-23 VITALS — BP 120/78 | HR 80 | Ht 69.0 in | Wt 234.0 lb

## 2016-04-23 DIAGNOSIS — I4891 Unspecified atrial fibrillation: Secondary | ICD-10-CM

## 2016-04-23 DIAGNOSIS — Z953 Presence of xenogenic heart valve: Secondary | ICD-10-CM

## 2016-04-23 DIAGNOSIS — I482 Chronic atrial fibrillation, unspecified: Secondary | ICD-10-CM

## 2016-04-23 DIAGNOSIS — I2510 Atherosclerotic heart disease of native coronary artery without angina pectoris: Secondary | ICD-10-CM | POA: Diagnosis not present

## 2016-04-23 DIAGNOSIS — I5032 Chronic diastolic (congestive) heart failure: Secondary | ICD-10-CM

## 2016-04-23 LAB — POCT INR: INR: 3

## 2016-04-23 MED ORDER — WARFARIN SODIUM 5 MG PO TABS: ORAL_TABLET | ORAL | 12 refills | Status: DC

## 2016-04-23 NOTE — Progress Notes (Signed)
Cardiology Office Note  Date: 04/23/2016   ID: Terry Wu, DOB February 11, 1932, MRN [id number]  PCP: Alonza Bogus, MD  Primary Cardiologist: Rozann Lesches, MD   Chief Complaint  Patient presents with  . Coronary Artery Disease  . Atrial Fibrillation    History of Present Illness: Terry Wu is an 82 y.o. male last seen in April. He is here today with his wife for a follow-up visit. Overall reports no significant change. He has no pain, no palpitations, states that his leg edema has been adequately controlled. We went over his home blood pressure checks and weights which have also been stable.  He continues on Coumadin and follows in the anticoagulation clinic. No reported bleeding problems. INR today was 3.0.  Cardiac regimen is stable.He continues on Norvasc, Lipitor, Coreg, Aldactone, Micardis, Demadex, and potassium supplements.  He continue to follow with Dr. Luan Pulling for primary care.  Past Medical History:  Diagnosis Date  . Aortic aneurysm (HCC)    Ascending thoracic - 4.8 cm by CT 2017  . Aortic regurgitation    Bioprosthetic AVR 2006  . Atrial fibrillation with controlled ventricular response ([place]) 08/22/2015   CHADS2VASC=5, on Coumadin  . CAD (coronary artery disease)    Multivessel status post CABG 2006  . Esophageal reflux   . Essential hypertension   . History of dizziness   . Mixed hyperlipidemia   . Type 2 diabetes mellitus ([place])     Past Surgical History:  Procedure Laterality Date  . AORTIC VALVE REPLACEMENT  2006   Edwards Pericardial tissue valve  . CORONARY ARTERY BYPASS GRAFT  2006   LIMA to LAD, SVG to diagonal, SVG to OM, SVG to PDA   . HERNIA REPAIR    . TOTAL KNEE ARTHROPLASTY Bilateral     Current Outpatient Prescriptions  Medication Sig Dispense Refill  . amLODipine (NORVASC) 10 MG tablet TAKE ONE TABLET BY MOUTH ONCE DAILY 30 tablet 10  . amoxicillin (AMOXIL) 500 MG capsule Take 2,000 mg by mouth as directed.     [name]  atorvastatin (LIPITOR) 20 MG tablet Take 1 tablet (20 mg total) by mouth daily. 90 tablet 3  . carvedilol (COREG) 12.5 MG tablet Take 1 tablet (12.5 mg total) by mouth 2 (two) times daily. 180 tablet 3  . Coenzyme Q10 (CO Q 10) 100 MG CAPS Take 1 capsule by mouth every morning.    . dorzolamide-timolol (COSOPT) 22.3-6.8 MG/ML ophthalmic solution Place 1 drop into both eyes 2 (two) times daily.    [name] glimepiride (AMARYL) 2 MG tablet Take 2 mg by mouth daily before breakfast.     . magnesium oxide (MAG-OX) 400 MG tablet Take 400 mg by mouth daily.    . Multiple Vitamins-Minerals (ICAPS PO) Take 2 tablets by mouth 3 (three) times daily after meals.     . pantoprazole (PROTONIX) 40 MG tablet Take 1 tablet (40 mg total) by mouth daily. 90 tablet 3  . potassium chloride SA (K-DUR,KLOR-CON) 20 MEQ tablet Take 20 mEq by mouth daily.     [name] spironolactone (ALDACTONE) 25 MG tablet TAKE ONE TABLET BY MOUTH DAILY 45 tablet 3  . telmisartan (MICARDIS) 40 MG tablet Take 40 mg by mouth daily.    [name] torsemide (DEMADEX) 100 MG tablet Take 50 mg by mouth daily.     [name] warfarin (COUMADIN) 5 MG tablet Take 1 tablet daily except 1/2 tablet on Mondays, Wednesdays and Fridays 30 tablet 12   No current facility-administered medications for  this visit.    Allergies:  Aspirin and Demerol   Social History: The patient  reports that he has never smoked. He has never used smokeless tobacco. He reports that he drinks alcohol. He reports that he does not use drugs.   ROS:  Please see the history of present illness. Otherwise, complete review of systems is positive for arthritic stiffness.  All other systems are reviewed and negative.   Physical Exam: VS:  BP 120/78   Pulse 80   Ht 5\' 9"  (1.753 m)   Wt 234 lb (106.1 kg)   SpO2 96%   BMI 34.56 kg/m , BMI Body mass index is 34.56 kg/m.  Wt Readings from Last 3 Encounters:  04/23/16 234 lb (106.1 kg)  10/13/15 231 lb (104.8 kg)  09/08/15 227 lb (103 kg)    General:  Overweight male, appears comfortable at rest. HEENT: Conjunctiva and lids normal, oropharynx clear. Neck: Supple, no elevated JVP or carotid bruits, no thyromegaly. Lungs: Clear, nonlabored breathing at rest. Cardiac: Irregularly irregular, no S3, soft systolic murmur, no pericardial rub. Abdomen: Soft, nontender, bowel sounds present, no guarding or rebound. Extremities: Chronic appearing but improved leg edema with stasis, distal pulses 2+. Skin: Warm and dry. Musculoskeletal: No kyphosis. Neuropsychiatric: Alert and oriented x3, affect grossly appropriate.  ECG: I personally reviewed the tracing from 09/08/2015 which showed rate-controlled atrial fibrillation with nonspecific ST changes.  Recent Labwork: 08/22/2015: B Natriuretic Peptide 234.0; TSH 2.224 08/23/2015: ALT 15; AST 15 08/25/2015: Hemoglobin 12.1; Platelets 160 09/22/2015: BUN 13; Creat 0.97; Potassium 4.1; Sodium 137   Other Studies Reviewed Today:  Echocardiogram 08/23/2015: Study Conclusions  - Left ventricle: The cavity size was normal. Wall thickness was  increased in a pattern of mild LVH. Systolic function was normal.  The estimated ejection fraction was in the range of 60% to 65%.  Wall motion was normal; there were no regional wall motion  abnormalities. - Aortic valve: 25 mm Edwards pericardial Magna prosthesis in  aortic position. No obvious perivalvular leak. Leaflet motion is  normal. There was no significant regurgitation. Mean gradient  (S): 10 mm Hg. Peak gradient (S): 17 mm Hg. VTI ratio of LVOT to  aortic valve: 0.61. Valve area (Vmax): 1.83 cm^2. Valve area  (Vmean): 1.63 cm^2. - Aortic root: The aortic root was mildly to moderately dilated. - Mitral valve: Calcified annulus. Mildly thickened leaflets .  There was trivial regurgitation. - Left atrium: The atrium was moderately to severely dilated. - Right atrium: The atrium was moderately to severely dilated.  Central venous pressure  (est): 8 mm Hg. - Tricuspid valve: There was trivial regurgitation. - Pulmonary arteries: Systolic pressure was moderately increased.  PA peak pressure: 51 mm Hg (S). - Pericardium, extracardiac: There was no pericardial effusion.  Impressions:  - Mild LVH with LVEF 60-65%, indeterminate diastolic function in  the setting of atrial fibrillation. Moderate to severe biatrial  enlargement. MAC with mildly thickened mitral leaflets and  trivial mitral regurgitation. Bioprosthetic aortic valve shows  grossly normal function as detailed above. Trivial tricuspid  regurgitation with evidence of moderate pulmonary hypertension,  PASP estimated 51 mmHg.  Assessment and Plan:  1. Chronic atrial fibrillation. Continue strategy of heart rate control and anticoagulation. He continues to follow in the Coumadin clinic.  2. CAD status post CABG in 2006. He remains angina free on medical therapy and we will continue with observation.  3. History of pericardial AVR at the time of CABG, valve function stable by echocardiogram earlier  this year. LVEF 60-65%.  4. Leg edema, chronic with diastolic dysfunction and atrial fibrillation. Continue Demadex and Aldactone.  Current medicines were reviewed with the patient today.  Disposition: Follow-up with me in 6 months.  Signed, Satira Sark, MD, [facility] 04/23/2016 4:19 PM    [facility] at [facility] [address] [place], [place] [address] Phone: [phone]; Fax: [phone]

## 2016-04-23 NOTE — Patient Instructions (Signed)
Your physician wants you to follow-up in: 6 months Dr McDowell You will receive a reminder letter in the mail two months in advance. If you don't receive a letter, please call our office to schedule the follow-up appointment.    Your physician recommends that you continue on your current medications as directed. Please refer to the Current Medication list given to you today.     Thank you for choosing [facility] !        

## 2016-06-04 ENCOUNTER — Ambulatory Visit (INDEPENDENT_AMBULATORY_CARE_PROVIDER_SITE_OTHER): Payer: Medicare Other | Admitting: *Deleted

## 2016-06-04 DIAGNOSIS — I2510 Atherosclerotic heart disease of native coronary artery without angina pectoris: Secondary | ICD-10-CM

## 2016-06-04 DIAGNOSIS — I4891 Unspecified atrial fibrillation: Secondary | ICD-10-CM | POA: Diagnosis not present

## 2016-06-04 LAB — POCT INR: INR: 3.2

## 2016-06-06 DIAGNOSIS — E119 Type 2 diabetes mellitus without complications: Secondary | ICD-10-CM | POA: Diagnosis not present

## 2016-06-06 DIAGNOSIS — J441 Chronic obstructive pulmonary disease with (acute) exacerbation: Secondary | ICD-10-CM | POA: Diagnosis not present

## 2016-06-06 DIAGNOSIS — I10 Essential (primary) hypertension: Secondary | ICD-10-CM | POA: Diagnosis not present

## 2016-07-03 ENCOUNTER — Ambulatory Visit (INDEPENDENT_AMBULATORY_CARE_PROVIDER_SITE_OTHER): Payer: Medicare Other | Admitting: *Deleted

## 2016-07-03 DIAGNOSIS — I4891 Unspecified atrial fibrillation: Secondary | ICD-10-CM

## 2016-07-03 LAB — POCT INR: INR: 2.2

## 2016-07-04 DIAGNOSIS — I2510 Atherosclerotic heart disease of native coronary artery without angina pectoris: Secondary | ICD-10-CM | POA: Diagnosis not present

## 2016-07-04 DIAGNOSIS — D649 Anemia, unspecified: Secondary | ICD-10-CM | POA: Diagnosis not present

## 2016-07-04 DIAGNOSIS — I110 Hypertensive heart disease with heart failure: Secondary | ICD-10-CM | POA: Diagnosis not present

## 2016-07-04 DIAGNOSIS — J449 Chronic obstructive pulmonary disease, unspecified: Secondary | ICD-10-CM | POA: Diagnosis not present

## 2016-07-05 DIAGNOSIS — I110 Hypertensive heart disease with heart failure: Secondary | ICD-10-CM | POA: Diagnosis not present

## 2016-07-05 DIAGNOSIS — J449 Chronic obstructive pulmonary disease, unspecified: Secondary | ICD-10-CM | POA: Diagnosis not present

## 2016-07-05 DIAGNOSIS — I2510 Atherosclerotic heart disease of native coronary artery without angina pectoris: Secondary | ICD-10-CM | POA: Diagnosis not present

## 2016-07-05 DIAGNOSIS — I482 Chronic atrial fibrillation: Secondary | ICD-10-CM | POA: Diagnosis not present

## 2016-07-05 DIAGNOSIS — D649 Anemia, unspecified: Secondary | ICD-10-CM | POA: Diagnosis not present

## 2016-07-24 DIAGNOSIS — H40001 Preglaucoma, unspecified, right eye: Secondary | ICD-10-CM | POA: Diagnosis not present

## 2016-07-24 DIAGNOSIS — H401123 Primary open-angle glaucoma, left eye, severe stage: Secondary | ICD-10-CM | POA: Diagnosis not present

## 2016-07-30 ENCOUNTER — Ambulatory Visit (INDEPENDENT_AMBULATORY_CARE_PROVIDER_SITE_OTHER): Payer: Medicare Other | Admitting: *Deleted

## 2016-07-30 DIAGNOSIS — I4891 Unspecified atrial fibrillation: Secondary | ICD-10-CM | POA: Diagnosis not present

## 2016-07-30 LAB — POCT INR: INR: 2.9

## 2016-08-27 ENCOUNTER — Ambulatory Visit (INDEPENDENT_AMBULATORY_CARE_PROVIDER_SITE_OTHER): Payer: Medicare Other | Admitting: *Deleted

## 2016-08-27 DIAGNOSIS — I4891 Unspecified atrial fibrillation: Secondary | ICD-10-CM | POA: Diagnosis not present

## 2016-08-27 LAB — POCT INR: INR: 2.3

## 2016-08-30 ENCOUNTER — Other Ambulatory Visit: Payer: Self-pay | Admitting: Physician Assistant

## 2016-09-03 ENCOUNTER — Other Ambulatory Visit: Payer: Self-pay

## 2016-09-06 ENCOUNTER — Other Ambulatory Visit: Payer: Self-pay | Admitting: Cardiology

## 2016-09-06 ENCOUNTER — Other Ambulatory Visit: Payer: Self-pay | Admitting: Physician Assistant

## 2016-10-03 DIAGNOSIS — J449 Chronic obstructive pulmonary disease, unspecified: Secondary | ICD-10-CM | POA: Diagnosis not present

## 2016-10-03 DIAGNOSIS — E119 Type 2 diabetes mellitus without complications: Secondary | ICD-10-CM | POA: Diagnosis not present

## 2016-10-03 DIAGNOSIS — I2510 Atherosclerotic heart disease of native coronary artery without angina pectoris: Secondary | ICD-10-CM | POA: Diagnosis not present

## 2016-10-03 DIAGNOSIS — I10 Essential (primary) hypertension: Secondary | ICD-10-CM | POA: Diagnosis not present

## 2016-10-08 ENCOUNTER — Ambulatory Visit (INDEPENDENT_AMBULATORY_CARE_PROVIDER_SITE_OTHER): Payer: Medicare Other | Admitting: *Deleted

## 2016-10-08 DIAGNOSIS — I4891 Unspecified atrial fibrillation: Secondary | ICD-10-CM | POA: Diagnosis not present

## 2016-10-08 LAB — POCT INR: INR: 2.5

## 2016-10-09 NOTE — Progress Notes (Signed)
Cardiology Office Note  Date: 10/10/2016   ID: Terry Wu, DOB [birth year], MRN [id number]  PCP: Alonza Bogus, MD  Primary Cardiologist: Rozann Lesches, MD   Chief Complaint  Patient presents with  . Atrial Fibrillation  . Status post AVR    History of Present Illness: Terry Wu is an 83 y.o. male last seen in October 2017. He presents today with his wife for a follow-up visit. He does not report any significant change in overall status from a cardiac perspective. No reported angina or worsening shortness of breath, no sense of palpitations. He is no longer driving due to worsening vision.  He continues on Coumadin with follow-up in the anticoagulation clinic. Last INR was therapeutic at 2.5. He does not report any bleeding problems.  I reviewed his current cardiac medications. He is on Norvasc, Lipitor, Coreg, Aldactone, potassium supplements, Demadex, and Micardis.  I personally reviewed his ECG today which shows rate-controlled atrial fibrillation.  Past Medical History:  Diagnosis Date  . Aortic aneurysm (HCC)    Ascending thoracic - 4.8 cm by CT 2017  . Aortic regurgitation    Bioprosthetic AVR 2006  . Atrial fibrillation with controlled ventricular response ([place]) 08/22/2015   CHADS2VASC=5, on Coumadin  . CAD (coronary artery disease)    Multivessel status post CABG 2006  . Esophageal reflux   . Essential hypertension   . History of dizziness   . Mixed hyperlipidemia   . Type 2 diabetes mellitus ([place])     Past Surgical History:  Procedure Laterality Date  . AORTIC VALVE REPLACEMENT  2006   Edwards Pericardial tissue valve  . CORONARY ARTERY BYPASS GRAFT  2006   LIMA to LAD, SVG to diagonal, SVG to OM, SVG to PDA   . HERNIA REPAIR    . TOTAL KNEE ARTHROPLASTY Bilateral     Current Outpatient Prescriptions  Medication Sig Dispense Refill  . amLODipine (NORVASC) 10 MG tablet TAKE ONE TABLET BY MOUTH ONCE DAILY 30 tablet 10  . amoxicillin  (AMOXIL) 500 MG capsule Take 2,000 mg by mouth as directed.     [name] atorvastatin (LIPITOR) 20 MG tablet TAKE ONE TABLET BY MOUTH ONCE DAILY 90 tablet 3  . carvedilol (COREG) 12.5 MG tablet TAKE ONE TABLET BY MOUTH TWICE DAILY 180 tablet 3  . Coenzyme Q10 (CO Q 10) 100 MG CAPS Take 1 capsule by mouth every morning.    . dorzolamide-timolol (COSOPT) 22.3-6.8 MG/ML ophthalmic solution Place 1 drop into both eyes 2 (two) times daily.    [name] glimepiride (AMARYL) 2 MG tablet Take 2 mg by mouth daily before breakfast.     . magnesium oxide (MAG-OX) 400 MG tablet Take 400 mg by mouth daily.    . Multiple Vitamins-Minerals (ICAPS PO) Take 2 tablets by mouth 3 (three) times daily after meals.     . pantoprazole (PROTONIX) 40 MG tablet Take 1 tablet (40 mg total) by mouth daily. 90 tablet 3  . potassium chloride SA (K-DUR,KLOR-CON) 20 MEQ tablet Take 20 mEq by mouth daily.     [name] spironolactone (ALDACTONE) 25 MG tablet TAKE ONE TABLET BY MOUTH ONCE DAILY 45 tablet 3  . telmisartan (MICARDIS) 40 MG tablet Take 40 mg by mouth daily.    [name] torsemide (DEMADEX) 100 MG tablet Take 50 mg by mouth daily.     [name] warfarin (COUMADIN) 5 MG tablet Take 1 tablet daily except 1/2 tablet on Mondays, Wednesdays and Fridays 30 tablet 12   No current  facility-administered medications for this visit.    Allergies:  Aspirin and Demerol   Social History: The patient  reports that he has never smoked. He has never used smokeless tobacco. He reports that he drinks alcohol. He reports that he does not use drugs.   ROS:  Please see the history of present illness. Otherwise, complete review of systems is positive for generalized fatigue.  All other systems are reviewed and negative.   Physical Exam: VS:  BP 106/66   Pulse 63   Ht _0  (1.753 m)   Wt 244 lb (110.7 kg)   SpO2 94%   BMI 36.03 kg/m , BMI Body mass index is 36.03 kg/m.  Wt Readings from Last 3 Encounters:  10/10/16 244 lb (110.7 kg)  04/23/16 234 lb (106.1 kg)    10/13/15 231 lb (104.8 kg)    General: Overweight male, appears comfortable at rest. HEENT: Conjunctiva and lids normal, oropharynx clear. Neck: Supple, no elevated JVP or carotid bruits, no thyromegaly. Lungs: Clear, nonlabored breathing at rest. Cardiac: Irregularly irregular, no S3, soft systolic murmur, no pericardial rub. Abdomen: Soft, nontender, bowel sounds present, no guarding or rebound. Extremities: Chronic appearing but improved leg edema with stasis, distal pulses 2+. Skin: Warm and dry. Musculoskeletal: No kyphosis. Neuropsychiatric: Alert and oriented x3, affect grossly appropriate.  ECG: I personally reviewed the tracing from 09/08/2015 which showed rate-controlled atrial fibrillation with nonspecific ST changes.  Recent Labwork:  08/22/2015: B Natriuretic Peptide 234.0; TSH 2.224 08/23/2015: ALT 15; AST 15 08/25/2015: Hemoglobin 12.1; Platelets 160 09/22/2015: BUN 13; Creat 0.97; Potassium 4.1; Sodium 137   Other Studies Reviewed Today:  Echocardiogram 08/23/2015: Study Conclusions  - Left ventricle: The cavity size was normal. Wall thickness was  increased in a pattern of mild LVH. Systolic function was normal.  The estimated ejection fraction was in the range of 60% to 65%.  Wall motion was normal; there were no regional wall motion  abnormalities. - Aortic valve: 25 mm Edwards pericardial Magna prosthesis in  aortic position. No obvious perivalvular leak. Leaflet motion is  normal. There was no significant regurgitation. Mean gradient  (S): 10 mm Hg. Peak gradient (S): 17 mm Hg. VTI ratio of LVOT to  aortic valve: 0.61. Valve area (Vmax): 1.83 cm^2. Valve area  (Vmean): 1.63 cm^2. - Aortic root: The aortic root was mildly to moderately dilated. - Mitral valve: Calcified annulus. Mildly thickened leaflets .  There was trivial regurgitation. - Left atrium: The atrium was moderately to severely dilated. - Right atrium: The atrium was moderately to  severely dilated.  Central venous pressure (est): 8 mm Hg. - Tricuspid valve: There was trivial regurgitation. - Pulmonary arteries: Systolic pressure was moderately increased.  PA peak pressure: 51 mm Hg (S). - Pericardium, extracardiac: There was no pericardial effusion.  Impressions:  - Mild LVH with LVEF 60-65%, indeterminate diastolic function in  the setting of atrial fibrillation. Moderate to severe biatrial  enlargement. MAC with mildly thickened mitral leaflets and  trivial mitral regurgitation. Bioprosthetic aortic valve shows  grossly normal function as detailed above. Trivial tricuspid  regurgitation with evidence of moderate pulmonary hypertension,  PASP estimated 51 mmHg.  Assessment and Plan:  1. CAD status post CABG in 2006. We continue with plan for observation in the absence of progressive angina symptoms on medical therapy.  2. Chronic atrial fibrillation, heart rate well-controlled on current regimen. He continues on Coumadin with follow-up in the anticoagulation clinic. ECG reviewed.  3. History of pericardial AVR at  the time of CABG in 2006. Valve function was normal by echocardiogram last year. No change in examination.  4. Chronic diastolic heart failure, symptomatically controlled on current doses of Demadex and Aldactone.  Current medicines were reviewed with the patient today.   Orders Placed This Encounter  Procedures  . EKG 12-Lead    Disposition: Follow-up in 6 months.  Signed, Satira Sark, MD, [facility] 10/10/2016 2:37 PM    [place] [facility] [address] [place], [place] [address] Phone: [phone]; Fax: [phone]

## 2016-10-10 ENCOUNTER — Encounter: Payer: Self-pay | Admitting: Cardiology

## 2016-10-10 ENCOUNTER — Ambulatory Visit (INDEPENDENT_AMBULATORY_CARE_PROVIDER_SITE_OTHER): Payer: Medicare Other | Admitting: Cardiology

## 2016-10-10 VITALS — BP 106/66 | HR 63 | Ht 69.0 in | Wt 244.0 lb

## 2016-10-10 DIAGNOSIS — I2510 Atherosclerotic heart disease of native coronary artery without angina pectoris: Secondary | ICD-10-CM

## 2016-10-10 DIAGNOSIS — I5032 Chronic diastolic (congestive) heart failure: Secondary | ICD-10-CM

## 2016-10-10 DIAGNOSIS — Z953 Presence of xenogenic heart valve: Secondary | ICD-10-CM | POA: Diagnosis not present

## 2016-10-10 DIAGNOSIS — I482 Chronic atrial fibrillation, unspecified: Secondary | ICD-10-CM

## 2016-10-10 NOTE — Patient Instructions (Signed)
Your physician wants you to follow-up in: 6 months You will receive a reminder letter in the mail two months in advance. If you don't receive a letter, please call our office to schedule the follow-up appointment.     Your physician recommends that you continue on your current medications as directed. Please refer to the Current Medication list given to you today.      Thank you for choosing [facility] !        

## 2016-10-16 ENCOUNTER — Other Ambulatory Visit: Payer: Self-pay | Admitting: Cardiovascular Disease

## 2016-11-15 DIAGNOSIS — E119 Type 2 diabetes mellitus without complications: Secondary | ICD-10-CM | POA: Diagnosis not present

## 2016-11-15 DIAGNOSIS — H40001 Preglaucoma, unspecified, right eye: Secondary | ICD-10-CM | POA: Diagnosis not present

## 2016-11-15 DIAGNOSIS — H401123 Primary open-angle glaucoma, left eye, severe stage: Secondary | ICD-10-CM | POA: Diagnosis not present

## 2016-11-15 DIAGNOSIS — H353130 Nonexudative age-related macular degeneration, bilateral, stage unspecified: Secondary | ICD-10-CM | POA: Diagnosis not present

## 2016-11-21 ENCOUNTER — Ambulatory Visit (INDEPENDENT_AMBULATORY_CARE_PROVIDER_SITE_OTHER): Payer: Medicare Other | Admitting: *Deleted

## 2016-11-21 DIAGNOSIS — I2510 Atherosclerotic heart disease of native coronary artery without angina pectoris: Secondary | ICD-10-CM | POA: Diagnosis not present

## 2016-11-21 DIAGNOSIS — I4891 Unspecified atrial fibrillation: Secondary | ICD-10-CM | POA: Diagnosis not present

## 2016-11-21 LAB — POCT INR: INR: 1.9

## 2016-12-17 ENCOUNTER — Other Ambulatory Visit: Payer: Self-pay | Admitting: Cardiology

## 2016-12-17 ENCOUNTER — Ambulatory Visit (INDEPENDENT_AMBULATORY_CARE_PROVIDER_SITE_OTHER): Payer: Medicare Other | Admitting: *Deleted

## 2016-12-17 DIAGNOSIS — I4891 Unspecified atrial fibrillation: Secondary | ICD-10-CM | POA: Diagnosis not present

## 2016-12-17 DIAGNOSIS — I2510 Atherosclerotic heart disease of native coronary artery without angina pectoris: Secondary | ICD-10-CM | POA: Diagnosis not present

## 2016-12-17 LAB — POCT INR: INR: 2.1

## 2017-01-07 DIAGNOSIS — J449 Chronic obstructive pulmonary disease, unspecified: Secondary | ICD-10-CM | POA: Diagnosis not present

## 2017-01-07 DIAGNOSIS — N401 Enlarged prostate with lower urinary tract symptoms: Secondary | ICD-10-CM | POA: Diagnosis not present

## 2017-01-07 DIAGNOSIS — E785 Hyperlipidemia, unspecified: Secondary | ICD-10-CM | POA: Diagnosis not present

## 2017-01-07 DIAGNOSIS — Z125 Encounter for screening for malignant neoplasm of prostate: Secondary | ICD-10-CM | POA: Diagnosis not present

## 2017-01-07 DIAGNOSIS — D649 Anemia, unspecified: Secondary | ICD-10-CM | POA: Diagnosis not present

## 2017-01-07 DIAGNOSIS — Z0000 Encounter for general adult medical examination without abnormal findings: Secondary | ICD-10-CM | POA: Diagnosis not present

## 2017-01-07 DIAGNOSIS — I2510 Atherosclerotic heart disease of native coronary artery without angina pectoris: Secondary | ICD-10-CM | POA: Diagnosis not present

## 2017-01-07 DIAGNOSIS — I360 Nonrheumatic tricuspid (valve) stenosis: Secondary | ICD-10-CM | POA: Diagnosis not present

## 2017-01-07 DIAGNOSIS — E119 Type 2 diabetes mellitus without complications: Secondary | ICD-10-CM | POA: Diagnosis not present

## 2017-01-07 DIAGNOSIS — I482 Chronic atrial fibrillation: Secondary | ICD-10-CM | POA: Diagnosis not present

## 2017-01-07 DIAGNOSIS — K219 Gastro-esophageal reflux disease without esophagitis: Secondary | ICD-10-CM | POA: Diagnosis not present

## 2017-01-07 DIAGNOSIS — C61 Malignant neoplasm of prostate: Secondary | ICD-10-CM | POA: Diagnosis not present

## 2017-01-14 ENCOUNTER — Ambulatory Visit (INDEPENDENT_AMBULATORY_CARE_PROVIDER_SITE_OTHER): Payer: Medicare Other | Admitting: *Deleted

## 2017-01-14 DIAGNOSIS — I2510 Atherosclerotic heart disease of native coronary artery without angina pectoris: Secondary | ICD-10-CM

## 2017-01-14 DIAGNOSIS — I4891 Unspecified atrial fibrillation: Secondary | ICD-10-CM | POA: Diagnosis not present

## 2017-01-14 LAB — POCT INR: INR: 3.9

## 2017-01-16 DIAGNOSIS — R197 Diarrhea, unspecified: Secondary | ICD-10-CM | POA: Diagnosis not present

## 2017-01-30 ENCOUNTER — Inpatient Hospital Stay (HOSPITAL_COMMUNITY): Payer: Medicare Other

## 2017-01-30 ENCOUNTER — Inpatient Hospital Stay (HOSPITAL_COMMUNITY)
Admission: AD | Admit: 2017-01-30 | Discharge: 2017-02-14 | DRG: 329 | Disposition: A | Discharge status: Rehabilitation Facility | Payer: Medicare Other | Source: Ambulatory Visit | Attending: Internal Medicine | Admitting: Internal Medicine

## 2017-01-30 ENCOUNTER — Encounter (HOSPITAL_COMMUNITY): Payer: Self-pay

## 2017-01-30 DIAGNOSIS — I69322 Dysarthria following cerebral infarction: Secondary | ICD-10-CM | POA: Diagnosis not present

## 2017-01-30 DIAGNOSIS — J9811 Atelectasis: Secondary | ICD-10-CM | POA: Diagnosis not present

## 2017-01-30 DIAGNOSIS — C19 Malignant neoplasm of rectosigmoid junction: Secondary | ICD-10-CM | POA: Diagnosis not present

## 2017-01-30 DIAGNOSIS — Z7901 Long term (current) use of anticoagulants: Secondary | ICD-10-CM

## 2017-01-30 DIAGNOSIS — I639 Cerebral infarction, unspecified: Secondary | ICD-10-CM | POA: Diagnosis not present

## 2017-01-30 DIAGNOSIS — E860 Dehydration: Secondary | ICD-10-CM | POA: Diagnosis present

## 2017-01-30 DIAGNOSIS — I10 Essential (primary) hypertension: Secondary | ICD-10-CM | POA: Diagnosis not present

## 2017-01-30 DIAGNOSIS — I352 Nonrheumatic aortic (valve) stenosis with insufficiency: Secondary | ICD-10-CM | POA: Diagnosis present

## 2017-01-30 DIAGNOSIS — Z885 Allergy status to narcotic agent status: Secondary | ICD-10-CM

## 2017-01-30 DIAGNOSIS — I272 Pulmonary hypertension, unspecified: Secondary | ICD-10-CM | POA: Diagnosis not present

## 2017-01-30 DIAGNOSIS — I110 Hypertensive heart disease with heart failure: Secondary | ICD-10-CM | POA: Diagnosis present

## 2017-01-30 DIAGNOSIS — Z953 Presence of xenogenic heart valve: Secondary | ICD-10-CM

## 2017-01-30 DIAGNOSIS — Z8546 Personal history of malignant neoplasm of prostate: Secondary | ICD-10-CM | POA: Diagnosis not present

## 2017-01-30 DIAGNOSIS — K621 Rectal polyp: Secondary | ICD-10-CM | POA: Diagnosis not present

## 2017-01-30 DIAGNOSIS — Z933 Colostomy status: Secondary | ICD-10-CM | POA: Diagnosis not present

## 2017-01-30 DIAGNOSIS — N179 Acute kidney failure, unspecified: Secondary | ICD-10-CM | POA: Diagnosis not present

## 2017-01-30 DIAGNOSIS — I6521 Occlusion and stenosis of right carotid artery: Secondary | ICD-10-CM | POA: Diagnosis present

## 2017-01-30 DIAGNOSIS — Z01810 Encounter for preprocedural cardiovascular examination: Secondary | ICD-10-CM

## 2017-01-30 DIAGNOSIS — K644 Residual hemorrhoidal skin tags: Secondary | ICD-10-CM | POA: Diagnosis not present

## 2017-01-30 DIAGNOSIS — I63412 Cerebral infarction due to embolism of left middle cerebral artery: Secondary | ICD-10-CM | POA: Diagnosis not present

## 2017-01-30 DIAGNOSIS — I2510 Atherosclerotic heart disease of native coronary artery without angina pectoris: Secondary | ICD-10-CM | POA: Diagnosis present

## 2017-01-30 DIAGNOSIS — D329 Benign neoplasm of meninges, unspecified: Secondary | ICD-10-CM | POA: Diagnosis present

## 2017-01-30 DIAGNOSIS — R600 Localized edema: Secondary | ICD-10-CM | POA: Diagnosis present

## 2017-01-30 DIAGNOSIS — J95821 Acute postprocedural respiratory failure: Secondary | ICD-10-CM | POA: Diagnosis not present

## 2017-01-30 DIAGNOSIS — E1122 Type 2 diabetes mellitus with diabetic chronic kidney disease: Secondary | ICD-10-CM | POA: Diagnosis present

## 2017-01-30 DIAGNOSIS — I4819 Other persistent atrial fibrillation: Secondary | ICD-10-CM

## 2017-01-30 DIAGNOSIS — F05 Delirium due to known physiological condition: Secondary | ICD-10-CM | POA: Diagnosis not present

## 2017-01-30 DIAGNOSIS — N200 Calculus of kidney: Secondary | ICD-10-CM | POA: Diagnosis not present

## 2017-01-30 DIAGNOSIS — K625 Hemorrhage of anus and rectum: Secondary | ICD-10-CM | POA: Diagnosis not present

## 2017-01-30 DIAGNOSIS — N189 Chronic kidney disease, unspecified: Secondary | ICD-10-CM | POA: Diagnosis present

## 2017-01-30 DIAGNOSIS — I719 Aortic aneurysm of unspecified site, without rupture: Secondary | ICD-10-CM | POA: Diagnosis present

## 2017-01-30 DIAGNOSIS — E782 Mixed hyperlipidemia: Secondary | ICD-10-CM | POA: Diagnosis present

## 2017-01-30 DIAGNOSIS — D649 Anemia, unspecified: Secondary | ICD-10-CM | POA: Diagnosis present

## 2017-01-30 DIAGNOSIS — Z09 Encounter for follow-up examination after completed treatment for conditions other than malignant neoplasm: Secondary | ICD-10-CM

## 2017-01-30 DIAGNOSIS — G92 Toxic encephalopathy: Secondary | ICD-10-CM | POA: Diagnosis not present

## 2017-01-30 DIAGNOSIS — E871 Hypo-osmolality and hyponatremia: Secondary | ICD-10-CM | POA: Diagnosis present

## 2017-01-30 DIAGNOSIS — I481 Persistent atrial fibrillation: Secondary | ICD-10-CM | POA: Diagnosis not present

## 2017-01-30 DIAGNOSIS — R197 Diarrhea, unspecified: Secondary | ICD-10-CM | POA: Diagnosis not present

## 2017-01-30 DIAGNOSIS — J449 Chronic obstructive pulmonary disease, unspecified: Secondary | ICD-10-CM | POA: Diagnosis present

## 2017-01-30 DIAGNOSIS — I130 Hypertensive heart and chronic kidney disease with heart failure and stage 1 through stage 4 chronic kidney disease, or unspecified chronic kidney disease: Secondary | ICD-10-CM | POA: Diagnosis not present

## 2017-01-30 DIAGNOSIS — J90 Pleural effusion, not elsewhere classified: Secondary | ICD-10-CM | POA: Diagnosis not present

## 2017-01-30 DIAGNOSIS — I9581 Postprocedural hypotension: Secondary | ICD-10-CM | POA: Diagnosis not present

## 2017-01-30 DIAGNOSIS — D320 Benign neoplasm of cerebral meninges: Secondary | ICD-10-CM | POA: Diagnosis not present

## 2017-01-30 DIAGNOSIS — Z951 Presence of aortocoronary bypass graft: Secondary | ICD-10-CM

## 2017-01-30 DIAGNOSIS — D125 Benign neoplasm of sigmoid colon: Secondary | ICD-10-CM | POA: Diagnosis present

## 2017-01-30 DIAGNOSIS — C61 Malignant neoplasm of prostate: Secondary | ICD-10-CM | POA: Diagnosis not present

## 2017-01-30 DIAGNOSIS — E119 Type 2 diabetes mellitus without complications: Secondary | ICD-10-CM | POA: Diagnosis not present

## 2017-01-30 DIAGNOSIS — R0602 Shortness of breath: Secondary | ICD-10-CM | POA: Diagnosis not present

## 2017-01-30 DIAGNOSIS — I5032 Chronic diastolic (congestive) heart failure: Secondary | ICD-10-CM | POA: Diagnosis present

## 2017-01-30 DIAGNOSIS — E1169 Type 2 diabetes mellitus with other specified complication: Secondary | ICD-10-CM | POA: Diagnosis not present

## 2017-01-30 DIAGNOSIS — K420 Umbilical hernia with obstruction, without gangrene: Secondary | ICD-10-CM | POA: Diagnosis present

## 2017-01-30 DIAGNOSIS — Z9049 Acquired absence of other specified parts of digestive tract: Secondary | ICD-10-CM | POA: Diagnosis not present

## 2017-01-30 DIAGNOSIS — D62 Acute posthemorrhagic anemia: Secondary | ICD-10-CM | POA: Diagnosis not present

## 2017-01-30 DIAGNOSIS — J9601 Acute respiratory failure with hypoxia: Secondary | ICD-10-CM

## 2017-01-30 DIAGNOSIS — I63512 Cerebral infarction due to unspecified occlusion or stenosis of left middle cerebral artery: Secondary | ICD-10-CM | POA: Diagnosis not present

## 2017-01-30 DIAGNOSIS — I361 Nonrheumatic tricuspid (valve) insufficiency: Secondary | ICD-10-CM | POA: Diagnosis not present

## 2017-01-30 DIAGNOSIS — E669 Obesity, unspecified: Secondary | ICD-10-CM | POA: Diagnosis present

## 2017-01-30 DIAGNOSIS — Z952 Presence of prosthetic heart valve: Secondary | ICD-10-CM

## 2017-01-30 DIAGNOSIS — J9600 Acute respiratory failure, unspecified whether with hypoxia or hypercapnia: Secondary | ICD-10-CM

## 2017-01-30 DIAGNOSIS — K429 Umbilical hernia without obstruction or gangrene: Secondary | ICD-10-CM | POA: Diagnosis not present

## 2017-01-30 DIAGNOSIS — K633 Ulcer of intestine: Secondary | ICD-10-CM | POA: Diagnosis present

## 2017-01-30 DIAGNOSIS — J9690 Respiratory failure, unspecified, unspecified whether with hypoxia or hypercapnia: Secondary | ICD-10-CM | POA: Diagnosis not present

## 2017-01-30 DIAGNOSIS — R109 Unspecified abdominal pain: Secondary | ICD-10-CM | POA: Diagnosis not present

## 2017-01-30 DIAGNOSIS — J439 Emphysema, unspecified: Secondary | ICD-10-CM | POA: Diagnosis not present

## 2017-01-30 DIAGNOSIS — Z96653 Presence of artificial knee joint, bilateral: Secondary | ICD-10-CM | POA: Diagnosis present

## 2017-01-30 DIAGNOSIS — K8020 Calculus of gallbladder without cholecystitis without obstruction: Secondary | ICD-10-CM | POA: Diagnosis present

## 2017-01-30 DIAGNOSIS — I872 Venous insufficiency (chronic) (peripheral): Secondary | ICD-10-CM | POA: Diagnosis present

## 2017-01-30 DIAGNOSIS — E8770 Fluid overload, unspecified: Secondary | ICD-10-CM | POA: Diagnosis not present

## 2017-01-30 DIAGNOSIS — K219 Gastro-esophageal reflux disease without esophagitis: Secondary | ICD-10-CM | POA: Diagnosis present

## 2017-01-30 DIAGNOSIS — D128 Benign neoplasm of rectum: Secondary | ICD-10-CM | POA: Diagnosis present

## 2017-01-30 DIAGNOSIS — T814XXS Infection following a procedure, sequela: Secondary | ICD-10-CM | POA: Diagnosis not present

## 2017-01-30 DIAGNOSIS — L03311 Cellulitis of abdominal wall: Secondary | ICD-10-CM | POA: Diagnosis not present

## 2017-01-30 DIAGNOSIS — Z79899 Other long term (current) drug therapy: Secondary | ICD-10-CM

## 2017-01-30 DIAGNOSIS — I6302 Cerebral infarction due to thrombosis of basilar artery: Secondary | ICD-10-CM | POA: Diagnosis not present

## 2017-01-30 DIAGNOSIS — I6340 Cerebral infarction due to embolism of unspecified cerebral artery: Secondary | ICD-10-CM | POA: Insufficient documentation

## 2017-01-30 DIAGNOSIS — I482 Chronic atrial fibrillation: Secondary | ICD-10-CM | POA: Diagnosis not present

## 2017-01-30 DIAGNOSIS — Z6837 Body mass index (BMI) 37.0-37.9, adult: Secondary | ICD-10-CM

## 2017-01-30 DIAGNOSIS — R0902 Hypoxemia: Secondary | ICD-10-CM | POA: Diagnosis not present

## 2017-01-30 DIAGNOSIS — I700 Atherosclerosis of aorta: Secondary | ICD-10-CM | POA: Diagnosis not present

## 2017-01-30 DIAGNOSIS — Z886 Allergy status to analgesic agent status: Secondary | ICD-10-CM

## 2017-01-30 DIAGNOSIS — C187 Malignant neoplasm of sigmoid colon: Secondary | ICD-10-CM | POA: Diagnosis not present

## 2017-01-30 DIAGNOSIS — I158 Other secondary hypertension: Secondary | ICD-10-CM | POA: Diagnosis not present

## 2017-01-30 DIAGNOSIS — I63139 Cerebral infarction due to embolism of unspecified carotid artery: Secondary | ICD-10-CM

## 2017-01-30 DIAGNOSIS — R410 Disorientation, unspecified: Secondary | ICD-10-CM | POA: Diagnosis not present

## 2017-01-30 DIAGNOSIS — E876 Hypokalemia: Secondary | ICD-10-CM | POA: Diagnosis present

## 2017-01-30 DIAGNOSIS — R4182 Altered mental status, unspecified: Secondary | ICD-10-CM | POA: Diagnosis not present

## 2017-01-30 DIAGNOSIS — Z8673 Personal history of transient ischemic attack (TIA), and cerebral infarction without residual deficits: Secondary | ICD-10-CM

## 2017-01-30 DIAGNOSIS — R1310 Dysphagia, unspecified: Secondary | ICD-10-CM | POA: Diagnosis not present

## 2017-01-30 DIAGNOSIS — R32 Unspecified urinary incontinence: Secondary | ICD-10-CM | POA: Diagnosis not present

## 2017-01-30 DIAGNOSIS — I5033 Acute on chronic diastolic (congestive) heart failure: Secondary | ICD-10-CM | POA: Diagnosis present

## 2017-01-30 DIAGNOSIS — I6310 Cerebral infarction due to embolism of unspecified precerebral artery: Secondary | ICD-10-CM | POA: Diagnosis not present

## 2017-01-30 DIAGNOSIS — R5381 Other malaise: Secondary | ICD-10-CM | POA: Diagnosis not present

## 2017-01-30 DIAGNOSIS — E800 Hereditary erythropoietic porphyria: Secondary | ICD-10-CM | POA: Diagnosis not present

## 2017-01-30 DIAGNOSIS — C20 Malignant neoplasm of rectum: Secondary | ICD-10-CM | POA: Diagnosis not present

## 2017-01-30 HISTORY — DX: Adverse effect of unspecified anesthetic, initial encounter: T41.45XA

## 2017-01-30 HISTORY — DX: Other complications of anesthesia, initial encounter: T88.59XA

## 2017-01-30 LAB — CBC WITH DIFFERENTIAL/PLATELET
BASOS ABS: 0 10*3/uL (ref 0.0–0.1)
Basophils Relative: 0 %
EOS PCT: 2 %
Eosinophils Absolute: 0.2 10*3/uL (ref 0.0–0.7)
HCT: 31.1 % — ABNORMAL LOW (ref 39.0–52.0)
Hemoglobin: 10.8 g/dL — ABNORMAL LOW (ref 13.0–17.0)
LYMPHS PCT: 15 %
Lymphs Abs: 1.4 10*3/uL (ref 0.7–4.0)
MCH: 32.3 pg (ref 26.0–34.0)
MCHC: 34.7 g/dL (ref 30.0–36.0)
MCV: 93.1 fL (ref 78.0–100.0)
MONO ABS: 0.9 10*3/uL (ref 0.1–1.0)
Monocytes Relative: 9 %
Neutro Abs: 6.8 10*3/uL (ref 1.7–7.7)
Neutrophils Relative %: 74 %
PLATELETS: 184 10*3/uL (ref 150–400)
RBC: 3.34 MIL/uL — ABNORMAL LOW (ref 4.22–5.81)
RDW: 13.4 % (ref 11.5–15.5)
WBC: 9.2 10*3/uL (ref 4.0–10.5)

## 2017-01-30 LAB — COMPREHENSIVE METABOLIC PANEL
ALBUMIN: 3.5 g/dL (ref 3.5–5.0)
ALK PHOS: 27 U/L — AB (ref 38–126)
ALT: 20 U/L (ref 17–63)
AST: 16 U/L (ref 15–41)
Anion gap: 7 (ref 5–15)
BILIRUBIN TOTAL: 0.8 mg/dL (ref 0.3–1.2)
BUN: 77 mg/dL — AB (ref 6–20)
CALCIUM: 9.3 mg/dL (ref 8.9–10.3)
CO2: 28 mmol/L (ref 22–32)
Chloride: 93 mmol/L — ABNORMAL LOW (ref 101–111)
Creatinine, Ser: 1.84 mg/dL — ABNORMAL HIGH (ref 0.61–1.24)
GFR calc Af Amer: 37 mL/min — ABNORMAL LOW (ref >60)
GFR calc non Af Amer: 32 mL/min — ABNORMAL LOW (ref >60)
GLUCOSE: 76 mg/dL (ref 65–99)
POTASSIUM: 5.8 mmol/L — AB (ref 3.5–5.1)
Sodium: 128 mmol/L — ABNORMAL LOW (ref 135–145)
TOTAL PROTEIN: 6.7 g/dL (ref 6.5–8.1)

## 2017-01-30 LAB — GLUCOSE, CAPILLARY
Glucose-Capillary: 85 mg/dL (ref 65–99)
Glucose-Capillary: 75 mg/dL (ref 65–99)
Glucose-Capillary: 146 mg/dL — ABNORMAL HIGH (ref 65–99)

## 2017-01-30 LAB — C DIFFICILE QUICK SCREEN W PCR REFLEX
C DIFFICILE (CDIFF) INTERP: NOT DETECTED
C Diff antigen: NEGATIVE
C Diff toxin: NEGATIVE

## 2017-01-30 MED ORDER — CARVEDILOL 12.5 MG PO TABS
12.5000 mg | ORAL_TABLET | Freq: Two times a day (BID) | ORAL | Status: DC
  Administered 2017-01-30 – 2017-02-07 (×15): 12.5 mg via ORAL
  Filled 2017-01-30 (×10): qty 1
  Filled 2017-01-30: qty 4
  Filled 2017-01-30 (×6): qty 1

## 2017-01-30 MED ORDER — IOPAMIDOL (ISOVUE-300) INJECTION 61%
INTRAVENOUS | Status: AC
  Filled 2017-01-30: qty 30

## 2017-01-30 MED ORDER — SODIUM POLYSTYRENE SULFONATE 15 GM/60ML PO SUSP
30.0000 g | Freq: Once | ORAL | Status: AC
  Administered 2017-01-30: 30 g via ORAL
  Filled 2017-01-30: qty 120

## 2017-01-30 MED ORDER — DORZOLAMIDE HCL-TIMOLOL MAL 2-0.5 % OP SOLN
1.0000 [drp] | Freq: Two times a day (BID) | OPHTHALMIC | Status: DC
  Administered 2017-01-30 – 2017-02-14 (×29): 1 [drp] via OPHTHALMIC
  Filled 2017-01-30 (×3): qty 10

## 2017-01-30 MED ORDER — SODIUM CHLORIDE 0.9 % IV SOLN
INTRAVENOUS | Status: DC
  Administered 2017-01-30 – 2017-01-31 (×3): via INTRAVENOUS

## 2017-01-30 MED ORDER — CO Q 10 100 MG PO CAPS
1.0000 | ORAL_CAPSULE | Freq: Every morning | ORAL | Status: DC
Start: 2017-01-30 — End: 2017-01-30

## 2017-01-30 MED ORDER — ATORVASTATIN CALCIUM 20 MG PO TABS
20.0000 mg | ORAL_TABLET | Freq: Every day | ORAL | Status: DC
  Administered 2017-01-30 – 2017-02-06 (×8): 20 mg via ORAL
  Filled 2017-01-30 (×8): qty 1

## 2017-01-30 MED ORDER — ENOXAPARIN SODIUM 40 MG/0.4ML ~~LOC~~ SOLN
40.0000 mg | SUBCUTANEOUS | Status: DC
  Administered 2017-01-30 – 2017-01-31 (×2): 40 mg via SUBCUTANEOUS
  Filled 2017-01-30 (×2): qty 0.4

## 2017-01-30 MED ORDER — INSULIN ASPART 100 UNIT/ML ~~LOC~~ SOLN
0.0000 [IU] | Freq: Three times a day (TID) | SUBCUTANEOUS | Status: DC
  Administered 2017-02-01: 3 [IU] via SUBCUTANEOUS
  Administered 2017-02-02: 2 [IU] via SUBCUTANEOUS
  Administered 2017-02-02: 3 [IU] via SUBCUTANEOUS
  Administered 2017-02-03: 2 [IU] via SUBCUTANEOUS
  Administered 2017-02-03: 3 [IU] via SUBCUTANEOUS
  Administered 2017-02-04: 2 [IU] via SUBCUTANEOUS
  Administered 2017-02-04: 3 [IU] via SUBCUTANEOUS
  Administered 2017-02-04 – 2017-02-05 (×3): 2 [IU] via SUBCUTANEOUS
  Administered 2017-02-06 (×2): 3 [IU] via SUBCUTANEOUS
  Administered 2017-02-06: 2 [IU] via SUBCUTANEOUS

## 2017-01-30 MED ORDER — OCUVITE-LUTEIN PO CAPS
1.0000 | ORAL_CAPSULE | Freq: Three times a day (TID) | ORAL | Status: DC
  Administered 2017-01-30 – 2017-02-06 (×18): 1 via ORAL
  Filled 2017-01-30 (×20): qty 1

## 2017-01-30 MED ORDER — PANTOPRAZOLE SODIUM 40 MG PO TBEC
40.0000 mg | DELAYED_RELEASE_TABLET | Freq: Every day | ORAL | Status: DC
  Administered 2017-01-30 – 2017-02-06 (×8): 40 mg via ORAL
  Filled 2017-01-30 (×8): qty 1

## 2017-01-30 MED ORDER — INSULIN ASPART 100 UNIT/ML ~~LOC~~ SOLN: 0.0000 [IU] | Freq: Every day | SUBCUTANEOUS | Status: DC

## 2017-01-30 MED ORDER — IOPAMIDOL (ISOVUE-300) INJECTION 61%
75.0000 mL | Freq: Once | INTRAVENOUS | Status: AC | PRN
Start: 2017-01-30 — End: 2017-01-30
  Administered 2017-01-30: 75 mL via INTRAVENOUS

## 2017-01-30 MED ORDER — IRBESARTAN 150 MG PO TABS
150.0000 mg | ORAL_TABLET | Freq: Every day | ORAL | Status: DC
  Administered 2017-01-30 – 2017-02-01 (×3): 150 mg via ORAL
  Filled 2017-01-30 (×3): qty 1

## 2017-01-30 NOTE — Consult Note (Signed)
Referring Provider: Sinda Du, MD Primary Care Physician:  Sinda Du, MD Primary Gastroenterologist:  Dr. Laural Golden  Reason for Consultation:   Diarrhea.  HPI:   History is obtained from the patient and his wife. Patient is 83 year old Caucasian male with multiple medical problems who was in usual state of felt until 3 weeks ago when he developed nonbloody diarrhea. His wife did give him a few doses of Imodium. At the peak of is illness he was having more than dozen stools per day. He had accidents. He also had nocturnal diarrhea. He denies abdominal pain melena or rectal bleeding. His wife feels he may have passed a small amount of blood couple times which she attributes to irritated area and perianal region. She has been applying topical cream to this area. Patient has noted decrease in his appetite. He did not experience nausea vomiting fever or chills. There is no history of recent antibiotic use and he has not been out of the country. Patient presented Dr. Luan Pulling office this morning and he appeared to be acutely ill. Clinically he was felt to be dehydrated and he was therefore hospitalized. Patient was begun on IV fluids and feel some better. He also complains of dyspnea with minimal exertion. Chest film revealed enlarged cardiac silhouette and evidence of CABG and aVR and chronic bronchitic changes with left basilar atelectasis but no CHF or pneumonia. Patient states he had colonoscopy long time ago.   Past Medical History:  Diagnosis Date  . Aortic aneurysm (HCC)    Ascending thoracic - 4.8 cm by CT 2017  . Aortic regurgitation    Bioprosthetic AVR 2006  . Atrial fibrillation with controlled ventricular response ([place]) 08/22/2015   CHADS2VASC=5, on Coumadin  . CAD (coronary artery disease)    Multivessel status post CABG 2006  . Esophageal reflux   . Essential hypertension   . History of dizziness   . Mixed hyperlipidemia   . Type 2 diabetes mellitus (HCC)          Impaired vision possibly due to macular degeneration. Patient is unable to drive and only able to read large print.  Past Surgical History:  Procedure Laterality Date  . AORTIC VALVE REPLACEMENT  2006   [facility] Pericardial tissue valve  . CORONARY ARTERY BYPASS GRAFT  2006   LIMA to LAD, SVG to diagonal, SVG to OM, SVG to PDA   . HERNIA REPAIR    . TOTAL KNEE ARTHROPLASTY Bilateral        Bilateral cataract extraction  Prior to Admission medications   Medication Sig Start Date End Date Taking? Authorizing Provider  amLODipine (NORVASC) 10 MG tablet TAKE ONE TABLET BY MOUTH ONCE DAILY 12/17/16  Yes Satira Sark, MD  amoxicillin (AMOXIL) 500 MG capsule Take 2,000 mg by mouth as directed.    Yes [provider]  atorvastatin (LIPITOR) 20 MG tablet TAKE ONE TABLET BY MOUTH ONCE DAILY 09/06/16  Yes Josue Hector, MD  carvedilol (COREG) 12.5 MG tablet TAKE ONE TABLET BY MOUTH TWICE DAILY 08/31/16  Yes Satira Sark, MD  Coenzyme Q10 (CO Q 10) 100 MG CAPS Take 1 capsule by mouth every morning.   Yes [provider]  dorzolamide-timolol (COSOPT) 22.3-6.8 MG/ML ophthalmic solution Place 1 drop into both eyes 2 (two) times daily.   Yes [provider]  glimepiride (AMARYL) 2 MG tablet Take 2 mg by mouth daily before breakfast.  02/16/13  Yes Sinda Du, MD  magnesium oxide (MAG-OX) 400 MG tablet Take  400 mg by mouth daily.   Yes [provider]  Multiple Vitamins-Minerals (ICAPS PO) Take 2 tablets by mouth 3 (three) times daily after meals.    Yes [provider]  pantoprazole (PROTONIX) 40 MG tablet Take 1 tablet (40 mg total) by mouth daily. 09/08/15  Yes Barrett, Evelene Croon, PA-C  potassium chloride SA (K-DUR,KLOR-CON) 20 MEQ tablet Take 20 mEq by mouth daily.    Yes [provider]  spironolactone (ALDACTONE) 25 MG tablet TAKE ONE TABLET BY MOUTH ONCE DAILY 09/06/16  Yes Satira Sark, MD  telmisartan (MICARDIS) 40 MG tablet Take  40 mg by mouth daily. 02/16/13  Yes Sinda Du, MD  torsemide (DEMADEX) 100 MG tablet Take 50 mg by mouth daily.    Yes [provider]  warfarin (COUMADIN) 5 MG tablet Take 1 tablet daily except 1/2 tablet on Mondays, Wednesdays and Fridays Patient taking differently: Take 1 tablet daily except 1/2 tablet on sundays, Tuesday, thursday and saturdays 04/23/16  Yes Satira Sark, MD    Current Facility-Administered Medications  Medication Dose Route Frequency Provider Last Rate Last Dose  . 0.9 %  sodium chloride infusion   Intravenous Continuous Sinda Du, MD 75 mL/hr at 01/30/17 1445    . atorvastatin (LIPITOR) tablet 20 mg  20 mg Oral Daily Sinda Du, MD   20 mg at 01/30/17 1445  . carvedilol (COREG) tablet 12.5 mg  12.5 mg Oral BID Sinda Du, MD   12.5 mg at 01/30/17 1445  . dorzolamide-timolol (COSOPT) 22.3-6.8 MG/ML ophthalmic solution 1 drop  1 drop Both Eyes BID Sinda Du, MD   1 drop at 01/30/17 1445  . enoxaparin (LOVENOX) injection 40 mg  40 mg Subcutaneous Q24H Sinda Du, MD   40 mg at 01/30/17 1445  . insulin aspart (novoLOG) injection 0-15 Units  0-15 Units Subcutaneous TID WC Sinda Du, MD      . insulin aspart (novoLOG) injection 0-5 Units  0-5 Units Subcutaneous QHS Sinda Du, MD      . iopamidol (ISOVUE-300) 61 % injection           . irbesartan (AVAPRO) tablet 150 mg  150 mg Oral Daily Sinda Du, MD   150 mg at 01/30/17 1445  . multivitamin-lutein (OCUVITE-LUTEIN) capsule 1 capsule  1 capsule Oral TID Larey Seat, MD   1 capsule at 01/30/17 1753  . pantoprazole (PROTONIX) EC tablet 40 mg  40 mg Oral Daily Sinda Du, MD   40 mg at 01/30/17 1445    Allergies as of 01/30/2017 - Review Complete 01/30/2017  Allergen Reaction Noted  . Aspirin  03/30/2011  . Demerol  03/30/2011    Family History  Problem Relation Age of Onset  . Heart Problems Mother   . Stroke Father   . Cancer Unknown      Social History   Social History  . Marital status: Married    Spouse name: N/A  . Number of children: N/A  . Years of education: N/A   Occupational History  . Retired    Social History Main Topics  . Smoking status: Never Smoker  . Smokeless tobacco: Never Used  . Alcohol use No  . Drug use: No  . Sexual activity: Not Currently   Other Topics Concern  . Not on file   Social History Narrative  . No narrative on file    Review of Systems: See HPI, otherwise normal ROS  Physical Exam: Temp:  [97.4 F (36.3 C)-97.6 F (  36.4 C)] 97.6 F (36.4 C) (08/08 2122) Pulse Rate:  [53-56] 55 (08/08 2122) Resp:  [16-18] 16 (08/08 2122) BP: (108-118)/(47-59) 108/59 (08/08 2122) SpO2:  [93 %-96 %] 93 % (08/08 2122) Weight:  [254 lb (115.2 kg)] 254 lb (115.2 kg) (08/08 1220) Last BM Date: 01/30/17  Well-developed well-nourished Caucasian male who is in no acute distress but does not appear to be feeling well. Conjunctiva was pink. Sclerae nonicteric. Oropharyngeal mucosa is normal. No neck masses or thyromegaly noted. Cardiac exam with regular rhythm normal S1 and prominent S2. No murmur or gallop noted. Auscultation of lungs revealed was a poor breath sounds bilaterally. Abdomen is distended. Umbilical hernia noted by the size of a golf ball. It is soft and partially reducible. Bowel sounds are normal. No bruits noted. On palpation abdomen is soft and nontender without organomegaly or masses. He has 3+ pitting edema involving both legs.  Lab Results:  Recent Labs  01/30/17 1330  WBC 9.2  HGB 10.8*  HCT 31.1*  PLT 184   BMET  Recent Labs  01/30/17 1330  NA 128*  K 5.8*  CL 93*  CO2 28  GLUCOSE 76  BUN 77*  CREATININE 1.84*  CALCIUM 9.3   LFT  Recent Labs  01/30/17 1330  PROT 6.7  ALBUMIN 3.5  AST 16  ALT 20  ALKPHOS 27*  BILITOT 0.8   C. difficile antigen and toxin are negative.   Studies/Results: X-ray Chest Pa And Lateral  Result Date:  01/30/2017 CLINICAL DATA:  Shortness of breath, weakness, diarrhea ; history type II diabetes mellitus, essential hypertension, coronary artery disease EXAM: CHEST  2 VIEW COMPARISON:  08/22/2015 FINDINGS: Enlargement of cardiac silhouette post CABG and AVR. Atherosclerotic calcification aorta. Pulmonary vascularity normal. Chronic bronchitic changes with LEFT basilar atelectasis. No gross acute infiltrate, pleural effusion or pneumothorax. Scattered degenerative disc disease changes thoracic spine. IMPRESSION: Enlargement of cardiac silhouette post CABG and AVR. Chronic bronchitic changes with mild LEFT basilar atelectasis. When compared to the previous exam, LEFT basilar aeration appears improved. Aortic Atherosclerosis (ICD10-I70.0). Electronically Signed   By: Lavonia Dana M.D.   On: 01/30/2017 17:21   Ct Abdomen Pelvis W Contrast  Result Date: 01/30/2017 CLINICAL DATA:  Diarrhea for 3 weeks, abdominal swelling, weakness, shortness of breath EXAM: CT ABDOMEN AND PELVIS WITH CONTRAST TECHNIQUE: Multidetector CT imaging of the abdomen and pelvis was performed using the standard protocol following bolus administration of intravenous contrast. Sagittal and coronal images were reconstructed from the axial data set. CONTRAST:  75 cc Isovue 300 IV. Dilute oral contrast. COMPARISON:  12/04/2010 FINDINGS: Lower chest: Mild LEFT basilar atelectasis. Hepatobiliary: Multiple calcified gallstones in gallbladder largest 2.3 cm diameter. No definite gallbladder wall thickening. Liver unremarkable. No biliary dilatation. Pancreas: Normal appearance Spleen: Normal appearance.  Tiny splenules anterior to spleen. Adrenals/Urinary Tract: Small nonobstructing calculus inferior pole LEFT kidney. Adrenal glands, kidneys, ureters and bladder otherwise unremarkable. Stomach/Bowel: Increased stool throughout colon. Surgical clips at gastroesophageal junction question prior anti reflux procedure. Duodenal diverticulum noted. Remaining  bowel loops and stomach unremarkable. Vascular/Lymphatic: Extensive atherosclerotic calcifications extensive atherosclerotic disease aorta and coronary arteries. Aorta normal caliber. Extensive calcified atherosclerotic plaque at the origins of the celiac artery, SMA and renal arteries. No adenopathy. Reproductive: Brachytherapy seed implants within prostate bed Other: BILATERAL inguinal hernias containing fat. Moderate-sized hernia containing fat. No free intraperitoneal air or fluid. No definite inflammatory process identified. Musculoskeletal: Scattered degenerative disc and facet disease changes of the thoracolumbar spine. No acute osseous findings. IMPRESSION:  Umbilical and BILATERAL inguinal hernias containing fat. Increased stool in colon. Small nonobstructing LEFT renal calculus. Cholelithiasis without definite CT evidence of cholecystitis though if this is a clinical concern recommend ultrasound follow-up. Extensive calcified atherosclerotic plaques of the abdominal aorta including the origins of the abdominal visceral vessels. Coronary artery calcifications. No acute intra-abdominal or intrapelvic process identified. Electronically Signed   By: Lavonia Dana M.D.   On: 01/30/2017 17:47  CT films reviewed.  Assessment;  Acute diarrhea in a patient with multiple comorbidities. Most likely etiology is an infection. Doubt toxic enteropathy as he does not appear to be acutely ill. I agree clinically he appears to be dehydrated. Stool culture on an outpatient basis was negative. Stool tests for C. difficile is negative.  Abdominopelvic CT reveals no evidence of small or large bowel wall thickening but it reveals extensive atherosclerotic changes to aorta as well as to takeoff of celiac trunk and SMA. Present patient is not typical of ischemic colitis or malabsorption.  Further testing would be undertaken with GI pathogen panel.  Anemia. No evidence of overt GI bleed. Anemia may be secondary to acute  illness.  Electrolyte abnormalities appear to be due to diuretic therapy and diarrhea. Hyponatremia felt to be secondary to diuretic therapy and diarrhea and hypokalemia felt to be due to spironolactone.  Cholelithiasis appears to be asymptomatic.   Recommendations;  GI pathogen panel. Will also is check stool for occult blood. Will allow full liquid diet starting tomorrow morning.   LOS: 0 days   Abir Craine  01/30/2017, 9:30 PM

## 2017-01-30 NOTE — H&P (Signed)
Terry Wu MRN: [id number] DOB/AGE: 81-Apr-1933 81 y.o. Primary Care Physician:Terry Wu, Terry Miller, MD Admit date: 01/30/2017 Chief Complaint: diarrhea HPI: He came to my office with diarrhea for 2 weeks. He looks dehydrated. He has significant other issues including diabetes coronary disease chronic atrial fib bioprosthetic aortic valve aortic aneurysm coronary disease. He's been on liquid diet but it has not helped. He's having 12-15 bowel movements daily. He is short of breath when he ambulates now. He's not having a lot of swelling. No chest pain. No nausea. No vomiting. No fever.  Past Medical History:  Diagnosis Date  . Aortic aneurysm (HCC)    Ascending thoracic - 4.8 cm by CT 2017  . Aortic regurgitation    Bioprosthetic AVR 2006  . Atrial fibrillation with controlled ventricular response ([place]) 08/22/2015   CHADS2VASC=5, on Coumadin  . CAD (coronary artery disease)    Multivessel status post CABG 2006  . Esophageal reflux   . Essential hypertension   . History of dizziness   . Mixed hyperlipidemia   . Type 2 diabetes mellitus ([place])    Past Surgical History:  Procedure Laterality Date  . AORTIC VALVE REPLACEMENT  2006   Edwards Pericardial tissue valve  . CORONARY ARTERY BYPASS GRAFT  2006   LIMA to LAD, SVG to diagonal, SVG to OM, SVG to PDA   . HERNIA REPAIR    . TOTAL KNEE ARTHROPLASTY Bilateral         Family History  Problem Relation Age of Onset  . Heart Problems Mother   . Stroke Father   . Cancer Unknown     Social History:  reports that he has never smoked. He has never used smokeless tobacco. He reports that he does not drink alcohol or use drugs.   Allergies:  Allergies  Allergen Reactions  . Aspirin     Unknown, per pt  . Demerol     Unknown, per pt    Medications Prior to Admission  Medication Sig Dispense Refill  . amLODipine (NORVASC) 10 MG tablet TAKE ONE TABLET BY MOUTH ONCE DAILY 90 tablet 1  . amoxicillin (AMOXIL) 500 MG capsule Take  2,000 mg by mouth as directed.     [name] atorvastatin (LIPITOR) 20 MG tablet TAKE ONE TABLET BY MOUTH ONCE DAILY 90 tablet 3  . carvedilol (COREG) 12.5 MG tablet TAKE ONE TABLET BY MOUTH TWICE DAILY 180 tablet 3  . Coenzyme Q10 (CO Q 10) 100 MG CAPS Take 1 capsule by mouth every morning.    . dorzolamide-timolol (COSOPT) 22.3-6.8 MG/ML ophthalmic solution Place 1 drop into both eyes 2 (two) times daily.    [name] glimepiride (AMARYL) 2 MG tablet Take 2 mg by mouth daily before breakfast.     . magnesium oxide (MAG-OX) 400 MG tablet Take 400 mg by mouth daily.    . Multiple Vitamins-Minerals (ICAPS PO) Take 2 tablets by mouth 3 (three) times daily after meals.     . pantoprazole (PROTONIX) 40 MG tablet Take 1 tablet (40 mg total) by mouth daily. 90 tablet 3  . pantoprazole (PROTONIX) 40 MG tablet TAKE ONE TABLET BY MOUTH ONCE DAILY 90 tablet 3  . potassium chloride SA (K-DUR,KLOR-CON) 20 MEQ tablet Take 20 mEq by mouth daily.     [name] spironolactone (ALDACTONE) 25 MG tablet TAKE ONE TABLET BY MOUTH ONCE DAILY 45 tablet 3  . telmisartan (MICARDIS) 40 MG tablet Take 40 mg by mouth daily.    [name] torsemide (DEMADEX) 100 MG tablet Take 50  mg by mouth daily.     [name] warfarin (COUMADIN) 5 MG tablet Take 1 tablet daily except 1/2 tablet on Mondays, Wednesdays and Fridays 30 tablet 12       [id number] from the symptoms mentioned above,there are no other symptoms referable to all systems reviewed.Otherwise 10 point review of systems is negative  Physical Exam: Blood pressure (!) 118/47, pulse (!) 56, temperature 97.6 F (36.4 C), temperature source Oral, resp. rate 18, height 5\' 9"  (1.753 m), weight 115.2 kg (254 lb), SpO2 95 %. Constitutional: He looks acutely and chronically sick. Eyes: He has poor vision but his pupils react. Ears nose mouth and throat: His mucous membranes are dry. His hearing is grossly normal. Cardiovascular: His heart is not regular. He has a systolic heart murmur. Respiratory: His respiratory  effort is normal. His lungs are relatively clear. Gastrointestinal: His abdomen is soft with no masses bowel sounds are hyperactive. Skin: Skin turgor is poor. Musculoskeletal: He is weak and he is in a wheelchair which is not his normal state. Neurological: No focal abnormalities. Psychiatric: Normal mood and affect   No results for input(s): WBC, NEUTROABS, HGB, HCT, MCV, PLT in the last 72 hours. No results for input(s): NA, K, CL, CO2, GLUCOSE, BUN, CREATININE, CALCIUM, MG in the last 72 hours.  Invalid input(s): PHOlablast2(ast:2,ALT:2,alkphos:2,bilitot:2,prot:2,albumin:2)@    No results found for this or any previous visit (from the past 240 hour(s)).   No results found. Impression: He has intractable diarrhea. I think he's probably dehydrated. I'm awaiting blood testing. I'm going to have him start on IV fluids. He had testing for gastrointestinal pathogens but was unable to get C. difficile done so that will be done. I'm going to request CT of the abdomen and pelvis. I will request GI consult. Hold some of his medications for blood pressure and some of his medications for CHF. Active Problems:   Diarrhea     Plan: As above      Terry Wu L   01/30/2017, 1:18 PM

## 2017-01-31 LAB — CBC
HCT: 29.7 % — ABNORMAL LOW (ref 39.0–52.0)
Hemoglobin: 10.2 g/dL — ABNORMAL LOW (ref 13.0–17.0)
MCH: 32.3 pg (ref 26.0–34.0)
MCHC: 34.3 g/dL (ref 30.0–36.0)
MCV: 94 fL (ref 78.0–100.0)
PLATELETS: 179 10*3/uL (ref 150–400)
RBC: 3.16 MIL/uL — ABNORMAL LOW (ref 4.22–5.81)
RDW: 13.4 % (ref 11.5–15.5)
WBC: 11.2 10*3/uL — ABNORMAL HIGH (ref 4.0–10.5)

## 2017-01-31 LAB — BASIC METABOLIC PANEL
Anion gap: 6 (ref 5–15)
BUN: 57 mg/dL — ABNORMAL HIGH (ref 6–20)
CALCIUM: 8.2 mg/dL — AB (ref 8.9–10.3)
CO2: 27 mmol/L (ref 22–32)
CREATININE: 1.47 mg/dL — AB (ref 0.61–1.24)
Chloride: 94 mmol/L — ABNORMAL LOW (ref 101–111)
GFR calc Af Amer: 49 mL/min — ABNORMAL LOW (ref >60)
GFR, EST NON AFRICAN AMERICAN: 42 mL/min — AB (ref >60)
GLUCOSE: 75 mg/dL (ref 65–99)
Potassium: 5.1 mmol/L (ref 3.5–5.1)
Sodium: 127 mmol/L — ABNORMAL LOW (ref 135–145)

## 2017-01-31 LAB — GLUCOSE, CAPILLARY
GLUCOSE-CAPILLARY: 73 mg/dL (ref 65–99)
Glucose-Capillary: 118 mg/dL — ABNORMAL HIGH (ref 65–99)
Glucose-Capillary: 94 mg/dL (ref 65–99)
Glucose-Capillary: 182 mg/dL — ABNORMAL HIGH (ref 65–99)

## 2017-01-31 LAB — OCCULT BLOOD X 1 CARD TO LAB, STOOL: Fecal Occult Bld: POSITIVE — AB

## 2017-01-31 NOTE — Progress Notes (Signed)
Subjective: He looks substantially better. He says he had his last loose stool at about 4:00 this morning. He had large volume stool through the night. CT scan did not show any evidence of colitis. I havepersonally reviewed it. Enteric pathogen panel is pending. He says he feels better.  Objective: Vital signs in last 24 hours: Temp:  [97.4 F (36.3 C)-97.9 F (36.6 C)] 97.9 F (36.6 C) (08/09 0650) Pulse Rate:  [53-58] 58 (08/09 0650) Resp:  [16-18] 16 (08/09 0650) BP: (108-118)/(47-66) 110/64 (08/09 0650) SpO2:  [93 %-96 %] 94 % (08/09 0650) Weight:  [115.2 kg (254 lb)] 115.2 kg (254 lb) (08/08 1220) Weight change:  Last BM Date: 01/30/17  Intake/Output from previous day: 08/08 0701 - 08/09 0700 In: 1383.8 [P.O.:240; I.V.:1143.8] Out: 200 [Urine:200]  PHYSICAL EXAM General appearance: alert, cooperative and mild distress Resp: clear to auscultation bilaterally Cardio: irregularly irregular rhythm GI: soft, non-tender; bowel sounds normal; no masses,  no organomegaly Extremities: He still has edema of both lower legs Skin turgor much better  Lab Results:  Results for orders placed or performed during the hospital encounter of 01/30/17 (from the past 48 hour(s))  Glucose, capillary     Status: None   Collection Time: 01/30/17 12:35 PM  Result Value Ref Range   Glucose-Capillary 85 65 - 99 mg/dL  Comprehensive metabolic panel     Status: Abnormal   Collection Time: 01/30/17  1:30 PM  Result Value Ref Range   Sodium 128 (L) 135 - 145 mmol/L   Potassium 5.8 (H) 3.5 - 5.1 mmol/L   Chloride 93 (L) 101 - 111 mmol/L   CO2 28 22 - 32 mmol/L   Glucose, Bld 76 65 - 99 mg/dL   BUN 77 (H) 6 - 20 mg/dL   Creatinine, Ser 1.84 (H) 0.61 - 1.24 mg/dL   Calcium 9.3 8.9 - 10.3 mg/dL   Total Protein 6.7 6.5 - 8.1 g/dL   Albumin 3.5 3.5 - 5.0 g/dL   AST 16 15 - 41 U/L   ALT 20 17 - 63 U/L   Alkaline Phosphatase 27 (L) 38 - 126 U/L   Total Bilirubin 0.8 0.3 - 1.2 mg/dL   GFR calc non  Af Amer 32 (L) >60 mL/min   GFR calc Af Amer 37 (L) >60 mL/min    Comment: (NOTE) The eGFR has been calculated using the CKD EPI equation. This calculation has not been validated in all clinical situations. eGFR's persistently <60 mL/min signify possible Chronic Kidney Disease.    Anion gap 7 5 - 15  CBC WITH DIFFERENTIAL     Status: Abnormal   Collection Time: 01/30/17  1:30 PM  Result Value Ref Range   WBC 9.2 4.0 - 10.5 K/uL   RBC 3.34 (L) 4.22 - 5.81 MIL/uL   Hemoglobin 10.8 (L) 13.0 - 17.0 g/dL   HCT 31.1 (L) 39.0 - 52.0 %   MCV 93.1 78.0 - 100.0 fL   MCH 32.3 26.0 - 34.0 pg   MCHC 34.7 30.0 - 36.0 g/dL   RDW 13.4 11.5 - 15.5 %   Platelets 184 150 - 400 K/uL   Neutrophils Relative % 74 %   Neutro Abs 6.8 1.7 - 7.7 K/uL   Lymphocytes Relative 15 %   Lymphs Abs 1.4 0.7 - 4.0 K/uL   Monocytes Relative 9 %   Monocytes Absolute 0.9 0.1 - 1.0 K/uL   Eosinophils Relative 2 %   Eosinophils Absolute 0.2 0.0 - 0.7 K/uL  Basophils Relative 0 %   Basophils Absolute 0.0 0.0 - 0.1 K/uL  Glucose, capillary     Status: None   Collection Time: 01/30/17  5:02 PM  Result Value Ref Range   Glucose-Capillary 75 65 - 99 mg/dL  C difficile quick scan w PCR reflex     Status: None   Collection Time: 01/30/17  6:30 PM  Result Value Ref Range   C Diff antigen NEGATIVE NEGATIVE   C Diff toxin NEGATIVE NEGATIVE   C Diff interpretation No C. difficile detected.   Glucose, capillary     Status: Abnormal   Collection Time: 01/30/17  9:20 PM  Result Value Ref Range   Glucose-Capillary 146 (H) 65 - 99 mg/dL  Occult blood card to lab, stool     Status: Abnormal   Collection Time: 01/31/17  3:12 AM  Result Value Ref Range   Fecal Occult Bld POSITIVE (A) NEGATIVE  Basic metabolic panel     Status: Abnormal   Collection Time: 01/31/17  5:42 AM  Result Value Ref Range   Sodium 127 (L) 135 - 145 mmol/L   Potassium 5.1 3.5 - 5.1 mmol/L   Chloride 94 (L) 101 - 111 mmol/L   CO2 27 22 - 32 mmol/L    Glucose, Bld 75 65 - 99 mg/dL   BUN 57 (H) 6 - 20 mg/dL   Creatinine, Ser 1.47 (H) 0.61 - 1.24 mg/dL   Calcium 8.2 (L) 8.9 - 10.3 mg/dL   GFR calc non Af Amer 42 (L) >60 mL/min   GFR calc Af Amer 49 (L) >60 mL/min    Comment: (NOTE) The eGFR has been calculated using the CKD EPI equation. This calculation has not been validated in all clinical situations. eGFR's persistently <60 mL/min signify possible Chronic Kidney Disease.    Anion gap 6 5 - 15  CBC     Status: Abnormal   Collection Time: 01/31/17  5:42 AM  Result Value Ref Range   WBC 11.2 (H) 4.0 - 10.5 K/uL   RBC 3.16 (L) 4.22 - 5.81 MIL/uL   Hemoglobin 10.2 (L) 13.0 - 17.0 g/dL   HCT 29.7 (L) 39.0 - 52.0 %   MCV 94.0 78.0 - 100.0 fL   MCH 32.3 26.0 - 34.0 pg   MCHC 34.3 30.0 - 36.0 g/dL   RDW 13.4 11.5 - 15.5 %   Platelets 179 150 - 400 K/uL  Glucose, capillary     Status: None   Collection Time: 01/31/17  7:23 AM  Result Value Ref Range   Glucose-Capillary 73 65 - 99 mg/dL    ABGS No results for input(s): PHART, PO2ART, TCO2, HCO3 in the last 72 hours.  Invalid input(s): PCO2 CULTURES Recent Results (from the past 240 hour(s))  C difficile quick scan w PCR reflex     Status: None   Collection Time: 01/30/17  6:30 PM  Result Value Ref Range Status   C Diff antigen NEGATIVE NEGATIVE Final   C Diff toxin NEGATIVE NEGATIVE Final   C Diff interpretation No C. difficile detected.  Final   Studies/Results: X-ray Chest Pa And Lateral  Result Date: 01/30/2017 CLINICAL DATA:  Shortness of breath, weakness, diarrhea ; history type II diabetes mellitus, essential hypertension, coronary artery disease EXAM: CHEST  2 VIEW COMPARISON:  08/22/2015 FINDINGS: Enlargement of cardiac silhouette post CABG and AVR. Atherosclerotic calcification aorta. Pulmonary vascularity normal. Chronic bronchitic changes with LEFT basilar atelectasis. No gross acute infiltrate, pleural effusion or pneumothorax. Scattered degenerative  disc  disease changes thoracic spine. IMPRESSION: Enlargement of cardiac silhouette post CABG and AVR. Chronic bronchitic changes with mild LEFT basilar atelectasis. When compared to the previous exam, LEFT basilar aeration appears improved. Aortic Atherosclerosis (ICD10-I70.0). Electronically Signed   By: Lavonia Dana M.D.   On: 01/30/2017 17:21   Ct Abdomen Pelvis W Contrast  Result Date: 01/30/2017 CLINICAL DATA:  Diarrhea for 3 weeks, abdominal swelling, weakness, shortness of breath EXAM: CT ABDOMEN AND PELVIS WITH CONTRAST TECHNIQUE: Multidetector CT imaging of the abdomen and pelvis was performed using the standard protocol following bolus administration of intravenous contrast. Sagittal and coronal images were reconstructed from the axial data set. CONTRAST:  75 cc Isovue 300 IV. Dilute oral contrast. COMPARISON:  12/04/2010 FINDINGS: Lower chest: Mild LEFT basilar atelectasis. Hepatobiliary: Multiple calcified gallstones in gallbladder largest 2.3 cm diameter. No definite gallbladder wall thickening. Liver unremarkable. No biliary dilatation. Pancreas: Normal appearance Spleen: Normal appearance.  Tiny splenules anterior to spleen. Adrenals/Urinary Tract: Small nonobstructing calculus inferior pole LEFT kidney. Adrenal glands, kidneys, ureters and bladder otherwise unremarkable. Stomach/Bowel: Increased stool throughout colon. Surgical clips at gastroesophageal junction question prior anti reflux procedure. Duodenal diverticulum noted. Remaining bowel loops and stomach unremarkable. Vascular/Lymphatic: Extensive atherosclerotic calcifications extensive atherosclerotic disease aorta and coronary arteries. Aorta normal caliber. Extensive calcified atherosclerotic plaque at the origins of the celiac artery, SMA and renal arteries. No adenopathy. Reproductive: Brachytherapy seed implants within prostate bed Other: BILATERAL inguinal hernias containing fat. Moderate-sized hernia containing fat. No free  intraperitoneal air or fluid. No definite inflammatory process identified. Musculoskeletal: Scattered degenerative disc and facet disease changes of the thoracolumbar spine. No acute osseous findings. IMPRESSION: Umbilical and BILATERAL inguinal hernias containing fat. Increased stool in colon. Small nonobstructing LEFT renal calculus. Cholelithiasis without definite CT evidence of cholecystitis though if this is a clinical concern recommend ultrasound follow-up. Extensive calcified atherosclerotic plaques of the abdominal aorta including the origins of the abdominal visceral vessels. Coronary artery calcifications. No acute intra-abdominal or intrapelvic process identified. Electronically Signed   By: Lavonia Dana M.D.   On: 01/30/2017 17:47    Medications:  Prior to Admission:  Prescriptions Prior to Admission  Medication Sig Dispense Refill Last Dose  . amLODipine (NORVASC) 10 MG tablet TAKE ONE TABLET BY MOUTH ONCE DAILY 90 tablet 1 01/29/2017 at Unknown time  . amoxicillin (AMOXIL) 500 MG capsule Take 2,000 mg by mouth as directed.    Taking  . atorvastatin (LIPITOR) 20 MG tablet TAKE ONE TABLET BY MOUTH ONCE DAILY 90 tablet 3 01/29/2017 at Unknown time  . carvedilol (COREG) 12.5 MG tablet TAKE ONE TABLET BY MOUTH TWICE DAILY 180 tablet 3 01/29/2017 at 1900  . Coenzyme Q10 (CO Q 10) 100 MG CAPS Take 1 capsule by mouth every morning.   01/29/2017 at Unknown time  . dorzolamide-timolol (COSOPT) 22.3-6.8 MG/ML ophthalmic solution Place 1 drop into both eyes 2 (two) times daily.   01/29/2017 at Unknown time  . glimepiride (AMARYL) 2 MG tablet Take 2 mg by mouth daily before breakfast.    01/29/2017 at Unknown time  . magnesium oxide (MAG-OX) 400 MG tablet Take 400 mg by mouth daily.   01/29/2017 at Unknown time  . Multiple Vitamins-Minerals (ICAPS PO) Take 2 tablets by mouth 3 (three) times daily after meals.    01/29/2017 at Unknown time  . pantoprazole (PROTONIX) 40 MG tablet Take 1 tablet (40 mg total) by mouth  daily. 90 tablet 3 01/29/2017 at Unknown time  . potassium chloride SA (K-DUR,KLOR-CON)  20 MEQ tablet Take 20 mEq by mouth daily.    01/29/2017 at Unknown time  . spironolactone (ALDACTONE) 25 MG tablet TAKE ONE TABLET BY MOUTH ONCE DAILY 45 tablet 3 01/29/2017 at Unknown time  . telmisartan (MICARDIS) 40 MG tablet Take 40 mg by mouth daily.   01/29/2017 at Unknown time  . torsemide (DEMADEX) 100 MG tablet Take 50 mg by mouth daily.    01/29/2017 at Unknown time  . warfarin (COUMADIN) 5 MG tablet Take 1 tablet daily except 1/2 tablet on Mondays, Wednesdays and Fridays (Patient taking differently: Take 1 tablet daily except 1/2 tablet on sundays, Tuesday, thursday and saturdays) 30 tablet 12 01/29/2017 at 1500   Scheduled: . atorvastatin  20 mg Oral Daily  . carvedilol  12.5 mg Oral BID  . dorzolamide-timolol  1 drop Both Eyes BID  . enoxaparin (LOVENOX) injection  40 mg Subcutaneous Q24H  . insulin aspart  0-15 Units Subcutaneous TID WC  . insulin aspart  0-5 Units Subcutaneous QHS  . irbesartan  150 mg Oral Daily  . multivitamin-lutein  1 capsule Oral TID PC  . pantoprazole  40 mg Oral Daily   Continuous: . sodium chloride 75 mL/hr at 01/31/17 0435   PRN:  Assesment:He was admitted with diarrhea and dehydration. He is better. Renal function looks better. He had acute kidney injury likely related to the diarrhea and dehydration. He has chronic kidney disease at baseline. At baseline he has chronic atrial fib on chronic anticoagulation, aortic stenosis, heart failure, COPD coronary disease diabetes and hypertension. His baseline problems seem to be pretty stable at this point and he has significantly improved as far as his dehydration. It remains to be seen if he is improving as far as the diarrhea is concerned Active Problems:   Diarrhea    Plan: Continue current treatments. Continue IV fluids at current rate for now.    LOS: 1 day   Diedre Maclellan L 01/31/2017, 9:48 AM

## 2017-01-31 NOTE — Progress Notes (Signed)
  Subjective:  Patient states he feels much better. He is not passing small pieces of stool. He denies abdominal pain nausea or vomiting. He is hungry. He denies dyspnea at rest. He experiences shortness of breath with minimal exertion though.  Objective: Blood pressure 110/64, pulse (!) 58, temperature 97.9 F (36.6 C), temperature source Oral, resp. rate 16, height 5\' 9"  (1.753 m), weight 254 lb (115.2 kg), SpO2 94 %. Patient is alert and in no acute distress.. Abdomen is protuberant with prominent umbilicus hernia. Abdomen remains soft and nontender without organomegaly or masses. He has small superficial ulcer in perianal region posterior to anal opening on the right side. It is dry. LE edema remains unchanged. It is 3+.  Labs/studies Results:   Recent Labs  01/30/17 1330 01/31/17 0542  WBC 9.2 11.2*  HGB 10.8* 10.2*  HCT 31.1* 29.7*  PLT 184 179    BMET   Recent Labs  01/30/17 1330 01/31/17 0542  NA 128* 127*  K 5.8* 5.1  CL 93* 94*  CO2 28 27  GLUCOSE 76 75  BUN 77* 57*  CREATININE 1.84* 1.47*  CALCIUM 9.3 8.2*    LFT   Recent Labs  01/30/17 1330  PROT 6.7  ALBUMIN 3.5  AST 16  ALT 20  ALKPHOS 27*  BILITOT 0.8    Hemoccult positive.  Assessment:  #1. Acute diarrhea. Stool testing for C. difficile antigen and toxin is negative. GI pathogen panel is pending. Symptomatically he has improved significantly in the last 24 hours. Therefore will advance diet and see how he does.  #2. Anemia. Hemoglobin has dropped some with hydration. His stool is guaiac positive but no overt GI bleed noted.  #3. Electrolyte abnormalities. Serum sodium remains low but serum potassium is now normal.    Recommendations:  Await results of GI pathogen panel. H&H and metabolic 7 in a.m.

## 2017-02-01 ENCOUNTER — Telehealth: Payer: Self-pay | Admitting: *Deleted

## 2017-02-01 DIAGNOSIS — N179 Acute kidney failure, unspecified: Secondary | ICD-10-CM | POA: Diagnosis present

## 2017-02-01 DIAGNOSIS — I5032 Chronic diastolic (congestive) heart failure: Secondary | ICD-10-CM | POA: Diagnosis present

## 2017-02-01 DIAGNOSIS — E860 Dehydration: Secondary | ICD-10-CM | POA: Diagnosis present

## 2017-02-01 LAB — GLUCOSE, CAPILLARY
GLUCOSE-CAPILLARY: 101 mg/dL — AB (ref 65–99)
GLUCOSE-CAPILLARY: 164 mg/dL — AB (ref 65–99)
Glucose-Capillary: 108 mg/dL — ABNORMAL HIGH (ref 65–99)
Glucose-Capillary: 184 mg/dL — ABNORMAL HIGH (ref 65–99)

## 2017-02-01 LAB — GASTROINTESTINAL PANEL BY PCR, STOOL (REPLACES STOOL CULTURE)
Adenovirus F40/41: NOT DETECTED
Astrovirus: NOT DETECTED
CAMPYLOBACTER SPECIES: NOT DETECTED
CRYPTOSPORIDIUM: NOT DETECTED
Cyclospora cayetanensis: NOT DETECTED
ENTEROAGGREGATIVE E COLI (EAEC): NOT DETECTED
ENTEROPATHOGENIC E COLI (EPEC): NOT DETECTED
ENTEROTOXIGENIC E COLI (ETEC): NOT DETECTED
Entamoeba histolytica: NOT DETECTED
GIARDIA LAMBLIA: NOT DETECTED
NOROVIRUS GI/GII: NOT DETECTED
Plesimonas shigelloides: NOT DETECTED
Rotavirus A: NOT DETECTED
SHIGELLA/ENTEROINVASIVE E COLI (EIEC): NOT DETECTED
Salmonella species: NOT DETECTED
Sapovirus (I, II, IV, and V): NOT DETECTED
Shiga like toxin producing E coli (STEC): NOT DETECTED
VIBRIO CHOLERAE: NOT DETECTED
VIBRIO SPECIES: NOT DETECTED
Yersinia enterocolitica: NOT DETECTED

## 2017-02-01 LAB — PROTIME-INR
INR: 2.43
Prothrombin Time: 26.8 seconds — ABNORMAL HIGH (ref 11.4–15.2)

## 2017-02-01 LAB — BASIC METABOLIC PANEL
Anion gap: 5 (ref 5–15)
BUN: 33 mg/dL — AB (ref 6–20)
CHLORIDE: 98 mmol/L — AB (ref 101–111)
CO2: 27 mmol/L (ref 22–32)
CREATININE: 1.16 mg/dL (ref 0.61–1.24)
Calcium: 7.9 mg/dL — ABNORMAL LOW (ref 8.9–10.3)
GFR calc non Af Amer: 56 mL/min — ABNORMAL LOW (ref >60)
GLUCOSE: 109 mg/dL — AB (ref 65–99)
Potassium: 4.9 mmol/L (ref 3.5–5.1)
Sodium: 130 mmol/L — ABNORMAL LOW (ref 135–145)

## 2017-02-01 LAB — HEMOGLOBIN AND HEMATOCRIT, BLOOD
HCT: 29.2 % — ABNORMAL LOW (ref 39.0–52.0)
Hemoglobin: 9.8 g/dL — ABNORMAL LOW (ref 13.0–17.0)

## 2017-02-01 MED ORDER — WARFARIN - PHARMACIST DOSING INPATIENT
Status: DC
  Administered 2017-02-01 – 2017-02-02 (×2)

## 2017-02-01 MED ORDER — SPIRONOLACTONE 25 MG PO TABS
25.0000 mg | ORAL_TABLET | Freq: Every day | ORAL | Status: DC
  Administered 2017-02-01 – 2017-02-06 (×6): 25 mg via ORAL
  Filled 2017-02-01 (×7): qty 1

## 2017-02-01 MED ORDER — WARFARIN SODIUM 5 MG PO TABS
5.0000 mg | ORAL_TABLET | Freq: Once | ORAL | Status: AC
  Administered 2017-02-01: 5 mg via ORAL
  Filled 2017-02-01: qty 1

## 2017-02-01 MED ORDER — TORSEMIDE 20 MG PO TABS
50.0000 mg | ORAL_TABLET | Freq: Every day | ORAL | Status: DC
  Administered 2017-02-01: 50 mg via ORAL
  Filled 2017-02-01: qty 3

## 2017-02-01 NOTE — Telephone Encounter (Signed)
Pt is currently admitted in the hospital and has been off his warfrain, will not be back on it till sometime late today.

## 2017-02-01 NOTE — Care Management Important Message (Signed)
Important Message  Patient Details  Name: Terry Wu MRN: [id number] Date of Birth: 12-Apr-1932   Medicare Important Message Given:  Yes    Lexine Jaspers, Chauncey Reading, RN 02/01/2017, 12:16 PM

## 2017-02-01 NOTE — Progress Notes (Signed)
  Subjective:  Patient has no GI complaints. He has had 2 bowel movements today. On both occasions he passed small amount of mushy stool. He denies abdominal pain nausea or vomiting. He continues to complain of exertional dyspnea with minimal exertion.  Objective: Blood pressure 118/77, pulse 64, temperature 98.4 F (36.9 C), temperature source Oral, resp. rate 18, height 5\' 9"  (1.753 m), weight 256 lb 9.6 oz (116.4 kg), SpO2 94 %. Patient is sitting at the edge of bed. He does not appear to be in any distress. Abdomen is protuberant with umbilicus hernia. On palpation abdomen is soft and nontender. LE edema has decreased and now appears to be 2+ bilaterally.  Labs/studies Results:   Recent Labs  01/30/17 1330 01/31/17 0542 02/01/17 0544  WBC 9.2 11.2*  --   HGB 10.8* 10.2* 9.8*  HCT 31.1* 29.7* 29.2*  PLT 184 179  --     BMET   Recent Labs  01/30/17 1330 01/31/17 0542 02/01/17 0544  NA 128* 127* 130*  K 5.8* 5.1 4.9  CL 93* 94* 98*  CO2 28 27 27   GLUCOSE 76 75 109*  BUN 77* 57* 33*  CREATININE 1.84* 1.47* 1.16  CALCIUM 9.3 8.2* 7.9*    LFT   Recent Labs  01/30/17 1330  PROT 6.7  ALBUMIN 3.5  AST 16  ALT 20  ALKPHOS 27*  BILITOT 0.8    PT/INR   Recent Labs  02/01/17 0922  LABPROT 26.8*  INR 2.43    GI pathogen panel is pending  Assessment:  #1. Acute diarrhea. To frequency has decreased and consistencies improving. Stool tests for C. difficile was negative. GI pathogen panel is pending.  #2. Anemia. Hemoglobin has dropped by one grams since admission but this drop may be due to hydration. His stool was heme positive.  #3. Hyponatremia. Serum sodium is coming up and may be back to his baseline. He has had history of hyponatremia according to his wife. Hyponatremia felt to be due to diuretic therapy.  #4. Dyspnea with minimal exertion felt to be due to CHF/COPD. Dr. Luan Pulling was place patient back on diuretic.   Plan:  Wait for results of GI  pathogen panel. Repeat H&H in a.m.

## 2017-02-01 NOTE — Progress Notes (Signed)
Subjective: He says he feels better. No new complaints. He's only had 1 bowel movement this morning since yesterday. This was not solid but much less liquid been previously. I wonder if he had a impaction with liquid stool coming around the impaction considering the amount of stool seen on CT. At any rate his diarrhea is improving. He is still short of breath. He has been off his diuretics and I also had him off Coumadin because of concerns that he may be having GI bleeding.  Objective: Vital signs in last 24 hours: Temp:  [97.9 F (36.6 C)-98.4 F (36.9 C)] 98.4 F (36.9 C) (08/10 0502) Pulse Rate:  [60-67] 64 (08/10 0502) Resp:  [18] 18 (08/10 0502) BP: (112-118)/(62-77) 118/77 (08/10 0502) SpO2:  [93 %-94 %] 94 % (08/10 0502) Weight:  [116.4 kg (256 lb 9.6 oz)] 116.4 kg (256 lb 9.6 oz) (08/10 0502) Weight change: 1.179 kg (2 lb 9.6 oz) Last BM Date: 01/31/17  Intake/Output from previous day: 08/09 0701 - 08/10 0700 In: 2445 [P.O.:720; I.V.:1725] Out: 650 [Urine:650]  PHYSICAL EXAM General appearance: alert, cooperative and mild distress Resp: rhonchi bilaterally Cardio: irregularly irregular rhythm GI: soft, non-tender; bowel sounds normal; no masses,  no organomegaly Extremities: 1-2+ edema Skin warm and dry  Lab Results:  Results for orders placed or performed during the hospital encounter of 01/30/17 (from the past 48 hour(s))  Glucose, capillary     Status: None   Collection Time: 01/30/17 12:35 PM  Result Value Ref Range   Glucose-Capillary 85 65 - 99 mg/dL  Comprehensive metabolic panel     Status: Abnormal   Collection Time: 01/30/17  1:30 PM  Result Value Ref Range   Sodium 128 (L) 135 - 145 mmol/L   Potassium 5.8 (H) 3.5 - 5.1 mmol/L   Chloride 93 (L) 101 - 111 mmol/L   CO2 28 22 - 32 mmol/L   Glucose, Bld 76 65 - 99 mg/dL   BUN 77 (H) 6 - 20 mg/dL   Creatinine, Ser 1.84 (H) 0.61 - 1.24 mg/dL   Calcium 9.3 8.9 - 10.3 mg/dL   Total Protein 6.7 6.5 - 8.1 g/dL    Albumin 3.5 3.5 - 5.0 g/dL   AST 16 15 - 41 U/L   ALT 20 17 - 63 U/L   Alkaline Phosphatase 27 (L) 38 - 126 U/L   Total Bilirubin 0.8 0.3 - 1.2 mg/dL   GFR calc non Af Amer 32 (L) >60 mL/min   GFR calc Af Amer 37 (L) >60 mL/min    Comment: (NOTE) The eGFR has been calculated using the CKD EPI equation. This calculation has not been validated in all clinical situations. eGFR's persistently <60 mL/min signify possible Chronic Kidney Disease.    Anion gap 7 5 - 15  CBC WITH DIFFERENTIAL     Status: Abnormal   Collection Time: 01/30/17  1:30 PM  Result Value Ref Range   WBC 9.2 4.0 - 10.5 K/uL   RBC 3.34 (L) 4.22 - 5.81 MIL/uL   Hemoglobin 10.8 (L) 13.0 - 17.0 g/dL   HCT 31.1 (L) 39.0 - 52.0 %   MCV 93.1 78.0 - 100.0 fL   MCH 32.3 26.0 - 34.0 pg   MCHC 34.7 30.0 - 36.0 g/dL   RDW 13.4 11.5 - 15.5 %   Platelets 184 150 - 400 K/uL   Neutrophils Relative % 74 %   Neutro Abs 6.8 1.7 - 7.7 K/uL   Lymphocytes Relative 15 %  Lymphs Abs 1.4 0.7 - 4.0 K/uL   Monocytes Relative 9 %   Monocytes Absolute 0.9 0.1 - 1.0 K/uL   Eosinophils Relative 2 %   Eosinophils Absolute 0.2 0.0 - 0.7 K/uL   Basophils Relative 0 %   Basophils Absolute 0.0 0.0 - 0.1 K/uL  Glucose, capillary     Status: None   Collection Time: 01/30/17  5:02 PM  Result Value Ref Range   Glucose-Capillary 75 65 - 99 mg/dL  C difficile quick scan w PCR reflex     Status: None   Collection Time: 01/30/17  6:30 PM  Result Value Ref Range   C Diff antigen NEGATIVE NEGATIVE   C Diff toxin NEGATIVE NEGATIVE   C Diff interpretation No C. difficile detected.   Glucose, capillary     Status: Abnormal   Collection Time: 01/30/17  9:20 PM  Result Value Ref Range   Glucose-Capillary 146 (H) 65 - 99 mg/dL  Occult blood card to lab, stool     Status: Abnormal   Collection Time: 01/31/17  3:12 AM  Result Value Ref Range   Fecal Occult Bld POSITIVE (A) NEGATIVE  Basic metabolic panel     Status: Abnormal   Collection Time:  01/31/17  5:42 AM  Result Value Ref Range   Sodium 127 (L) 135 - 145 mmol/L   Potassium 5.1 3.5 - 5.1 mmol/L   Chloride 94 (L) 101 - 111 mmol/L   CO2 27 22 - 32 mmol/L   Glucose, Bld 75 65 - 99 mg/dL   BUN 57 (H) 6 - 20 mg/dL   Creatinine, Ser 1.47 (H) 0.61 - 1.24 mg/dL   Calcium 8.2 (L) 8.9 - 10.3 mg/dL   GFR calc non Af Amer 42 (L) >60 mL/min   GFR calc Af Amer 49 (L) >60 mL/min    Comment: (NOTE) The eGFR has been calculated using the CKD EPI equation. This calculation has not been validated in all clinical situations. eGFR's persistently <60 mL/min signify possible Chronic Kidney Disease.    Anion gap 6 5 - 15  CBC     Status: Abnormal   Collection Time: 01/31/17  5:42 AM  Result Value Ref Range   WBC 11.2 (H) 4.0 - 10.5 K/uL   RBC 3.16 (L) 4.22 - 5.81 MIL/uL   Hemoglobin 10.2 (L) 13.0 - 17.0 g/dL   HCT 29.7 (L) 39.0 - 52.0 %   MCV 94.0 78.0 - 100.0 fL   MCH 32.3 26.0 - 34.0 pg   MCHC 34.3 30.0 - 36.0 g/dL   RDW 13.4 11.5 - 15.5 %   Platelets 179 150 - 400 K/uL  Glucose, capillary     Status: None   Collection Time: 01/31/17  7:23 AM  Result Value Ref Range   Glucose-Capillary 73 65 - 99 mg/dL  Glucose, capillary     Status: Abnormal   Collection Time: 01/31/17 11:29 AM  Result Value Ref Range   Glucose-Capillary 118 (H) 65 - 99 mg/dL  Glucose, capillary     Status: None   Collection Time: 01/31/17  4:14 PM  Result Value Ref Range   Glucose-Capillary 94 65 - 99 mg/dL  Glucose, capillary     Status: Abnormal   Collection Time: 01/31/17  8:50 PM  Result Value Ref Range   Glucose-Capillary 182 (H) 65 - 99 mg/dL   Comment 1 Notify RN    Comment 2 Document in Chart   Hemoglobin and hematocrit, blood     Status:  Abnormal   Collection Time: 02/01/17  5:44 AM  Result Value Ref Range   Hemoglobin 9.8 (L) 13.0 - 17.0 g/dL   HCT 29.2 (L) 39.0 - 25.0 %  Basic metabolic panel     Status: Abnormal   Collection Time: 02/01/17  5:44 AM  Result Value Ref Range   Sodium 130  (L) 135 - 145 mmol/L   Potassium 4.9 3.5 - 5.1 mmol/L   Chloride 98 (L) 101 - 111 mmol/L   CO2 27 22 - 32 mmol/L   Glucose, Bld 109 (H) 65 - 99 mg/dL   BUN 33 (H) 6 - 20 mg/dL   Creatinine, Ser 1.16 0.61 - 1.24 mg/dL   Calcium 7.9 (L) 8.9 - 10.3 mg/dL   GFR calc non Af Amer 56 (L) >60 mL/min   GFR calc Af Amer >60 >60 mL/min    Comment: (NOTE) The eGFR has been calculated using the CKD EPI equation. This calculation has not been validated in all clinical situations. eGFR's persistently <60 mL/min signify possible Chronic Kidney Disease.    Anion gap 5 5 - 15  Glucose, capillary     Status: Abnormal   Collection Time: 02/01/17  7:53 AM  Result Value Ref Range   Glucose-Capillary 108 (H) 65 - 99 mg/dL   Comment 1 Notify RN    Comment 2 Document in Chart     ABGS No results for input(s): PHART, PO2ART, TCO2, HCO3 in the last 72 hours.  Invalid input(s): PCO2 CULTURES Recent Results (from the past 240 hour(s))  C difficile quick scan w PCR reflex     Status: None   Collection Time: 01/30/17  6:30 PM  Result Value Ref Range Status   C Diff antigen NEGATIVE NEGATIVE Final   C Diff toxin NEGATIVE NEGATIVE Final   C Diff interpretation No C. difficile detected.  Final   Studies/Results: X-ray Chest Pa And Lateral  Result Date: 01/30/2017 CLINICAL DATA:  Shortness of breath, weakness, diarrhea ; history type II diabetes mellitus, essential hypertension, coronary artery disease EXAM: CHEST  2 VIEW COMPARISON:  08/22/2015 FINDINGS: Enlargement of cardiac silhouette post CABG and AVR. Atherosclerotic calcification aorta. Pulmonary vascularity normal. Chronic bronchitic changes with LEFT basilar atelectasis. No gross acute infiltrate, pleural effusion or pneumothorax. Scattered degenerative disc disease changes thoracic spine. IMPRESSION: Enlargement of cardiac silhouette post CABG and AVR. Chronic bronchitic changes with mild LEFT basilar atelectasis. When compared to the previous exam,  LEFT basilar aeration appears improved. Aortic Atherosclerosis (ICD10-I70.0). Electronically Signed   By: Lavonia Dana M.D.   On: 01/30/2017 17:21   Ct Abdomen Pelvis W Contrast  Result Date: 01/30/2017 CLINICAL DATA:  Diarrhea for 3 weeks, abdominal swelling, weakness, shortness of breath EXAM: CT ABDOMEN AND PELVIS WITH CONTRAST TECHNIQUE: Multidetector CT imaging of the abdomen and pelvis was performed using the standard protocol following bolus administration of intravenous contrast. Sagittal and coronal images were reconstructed from the axial data set. CONTRAST:  75 cc Isovue 300 IV. Dilute oral contrast. COMPARISON:  12/04/2010 FINDINGS: Lower chest: Mild LEFT basilar atelectasis. Hepatobiliary: Multiple calcified gallstones in gallbladder largest 2.3 cm diameter. No definite gallbladder wall thickening. Liver unremarkable. No biliary dilatation. Pancreas: Normal appearance Spleen: Normal appearance.  Tiny splenules anterior to spleen. Adrenals/Urinary Tract: Small nonobstructing calculus inferior pole LEFT kidney. Adrenal glands, kidneys, ureters and bladder otherwise unremarkable. Stomach/Bowel: Increased stool throughout colon. Surgical clips at gastroesophageal junction question prior anti reflux procedure. Duodenal diverticulum noted. Remaining bowel loops and stomach unremarkable. Vascular/Lymphatic: Extensive  atherosclerotic calcifications extensive atherosclerotic disease aorta and coronary arteries. Aorta normal caliber. Extensive calcified atherosclerotic plaque at the origins of the celiac artery, SMA and renal arteries. No adenopathy. Reproductive: Brachytherapy seed implants within prostate bed Other: BILATERAL inguinal hernias containing fat. Moderate-sized hernia containing fat. No free intraperitoneal air or fluid. No definite inflammatory process identified. Musculoskeletal: Scattered degenerative disc and facet disease changes of the thoracolumbar spine. No acute osseous findings.  IMPRESSION: Umbilical and BILATERAL inguinal hernias containing fat. Increased stool in colon. Small nonobstructing LEFT renal calculus. Cholelithiasis without definite CT evidence of cholecystitis though if this is a clinical concern recommend ultrasound follow-up. Extensive calcified atherosclerotic plaques of the abdominal aorta including the origins of the abdominal visceral vessels. Coronary artery calcifications. No acute intra-abdominal or intrapelvic process identified. Electronically Signed   By: Lavonia Dana M.D.   On: 01/30/2017 17:47    Medications:  Prior to Admission:  Prescriptions Prior to Admission  Medication Sig Dispense Refill Last Dose  . amLODipine (NORVASC) 10 MG tablet TAKE ONE TABLET BY MOUTH ONCE DAILY 90 tablet 1 01/29/2017 at Unknown time  . amoxicillin (AMOXIL) 500 MG capsule Take 2,000 mg by mouth as directed.    Taking  . atorvastatin (LIPITOR) 20 MG tablet TAKE ONE TABLET BY MOUTH ONCE DAILY 90 tablet 3 01/29/2017 at Unknown time  . carvedilol (COREG) 12.5 MG tablet TAKE ONE TABLET BY MOUTH TWICE DAILY 180 tablet 3 01/29/2017 at 1900  . Coenzyme Q10 (CO Q 10) 100 MG CAPS Take 1 capsule by mouth every morning.   01/29/2017 at Unknown time  . dorzolamide-timolol (COSOPT) 22.3-6.8 MG/ML ophthalmic solution Place 1 drop into both eyes 2 (two) times daily.   01/29/2017 at Unknown time  . glimepiride (AMARYL) 2 MG tablet Take 2 mg by mouth daily before breakfast.    01/29/2017 at Unknown time  . magnesium oxide (MAG-OX) 400 MG tablet Take 400 mg by mouth daily.   01/29/2017 at Unknown time  . Multiple Vitamins-Minerals (ICAPS PO) Take 2 tablets by mouth 3 (three) times daily after meals.    01/29/2017 at Unknown time  . pantoprazole (PROTONIX) 40 MG tablet Take 1 tablet (40 mg total) by mouth daily. 90 tablet 3 01/29/2017 at Unknown time  . potassium chloride SA (K-DUR,KLOR-CON) 20 MEQ tablet Take 20 mEq by mouth daily.    01/29/2017 at Unknown time  . spironolactone (ALDACTONE) 25 MG tablet  TAKE ONE TABLET BY MOUTH ONCE DAILY 45 tablet 3 01/29/2017 at Unknown time  . telmisartan (MICARDIS) 40 MG tablet Take 40 mg by mouth daily.   01/29/2017 at Unknown time  . torsemide (DEMADEX) 100 MG tablet Take 50 mg by mouth daily.    01/29/2017 at Unknown time  . warfarin (COUMADIN) 5 MG tablet Take 1 tablet daily except 1/2 tablet on Mondays, Wednesdays and Fridays (Patient taking differently: Take 1 tablet daily except 1/2 tablet on sundays, Tuesday, thursday and saturdays) 30 tablet 12 01/29/2017 at 1500   Scheduled: . atorvastatin  20 mg Oral Daily  . carvedilol  12.5 mg Oral BID  . dorzolamide-timolol  1 drop Both Eyes BID  . enoxaparin (LOVENOX) injection  40 mg Subcutaneous Q24H  . insulin aspart  0-15 Units Subcutaneous TID WC  . insulin aspart  0-5 Units Subcutaneous QHS  . irbesartan  150 mg Oral Daily  . multivitamin-lutein  1 capsule Oral TID PC  . pantoprazole  40 mg Oral Daily  . spironolactone  25 mg Oral Daily  . torsemide  50 mg Oral Daily   Continuous:  PRN:  Assesment: He was admitted with diarrhea. He does have heme positive stool but doesn't appear to be having a GI bleed. His diarrhea is better but not gone.   He has chronic atrial fibrillation and has been off Coumadin which will be resumed  He was dehydrated and that's better  At baseline he has chronic heart failure and I don't want to get him volume overloaded so I will discontinue IV fluids and restart diuretics Active Problems:   Diarrhea    Plan: As above. He is not ready for discharge but he is improved    LOS: 2 days   Suhayb Anzalone L 02/01/2017, 8:53 AM

## 2017-02-01 NOTE — Progress Notes (Addendum)
ANTICOAGULATION CONSULT NOTE - Initial Consult  Pharmacy Consult for Coumadin (home med) Indication: atrial fibrillation  Allergies  Allergen Reactions  . Aspirin     Unknown, per pt  . Demerol     Unknown, per pt   Patient Measurements: Height: 5\' 9"  (175.3 cm) Weight: 256 lb 9.6 oz (116.4 kg) IBW/kg (Calculated) : 70.7  Vital Signs: Temp: 98.4 F (36.9 C) (08/10 0502) Temp Source: Oral (08/10 0502) BP: 118/77 (08/10 0502) Pulse Rate: 64 (08/10 0502)  Labs:  Recent Labs  01/30/17 1330 01/31/17 0542 02/01/17 0544 02/01/17 0922  HGB 10.8* 10.2* 9.8*  --   HCT 31.1* 29.7* 29.2*  --   PLT 184 179  --   --   LABPROT  --   --   --  26.8*  INR  --   --   --  2.43  CREATININE 1.84* 1.47* 1.16  --    Estimated Creatinine Clearance: 60.7 mL/min (by C-G formula based on SCr of 1.16 mg/dL).  Medical History: Past Medical History:  Diagnosis Date  . Aortic aneurysm (HCC)    Ascending thoracic - 4.8 cm by CT 2017  . Aortic regurgitation    Bioprosthetic AVR 2006  . Atrial fibrillation with controlled ventricular response ([place]) 08/22/2015   CHADS2VASC=5, on Coumadin  . CAD (coronary artery disease)    Multivessel status post CABG 2006  . Esophageal reflux   . Essential hypertension   . History of dizziness   . Mixed hyperlipidemia   . Type 2 diabetes mellitus (HCC)    Medications:  Prescriptions Prior to Admission  Medication Sig Dispense Refill Last Dose  . amLODipine (NORVASC) 10 MG tablet TAKE ONE TABLET BY MOUTH ONCE DAILY 90 tablet 1 01/29/2017 at Unknown time  . amoxicillin (AMOXIL) 500 MG capsule Take 2,000 mg by mouth as directed.    Taking  . atorvastatin (LIPITOR) 20 MG tablet TAKE ONE TABLET BY MOUTH ONCE DAILY 90 tablet 3 01/29/2017 at Unknown time  . carvedilol (COREG) 12.5 MG tablet TAKE ONE TABLET BY MOUTH TWICE DAILY 180 tablet 3 01/29/2017 at 1900  . Coenzyme Q10 (CO Q 10) 100 MG CAPS Take 1 capsule by mouth every morning.   01/29/2017 at Unknown time  .  dorzolamide-timolol (COSOPT) 22.3-6.8 MG/ML ophthalmic solution Place 1 drop into both eyes 2 (two) times daily.   01/29/2017 at Unknown time  . glimepiride (AMARYL) 2 MG tablet Take 2 mg by mouth daily before breakfast.    01/29/2017 at Unknown time  . magnesium oxide (MAG-OX) 400 MG tablet Take 400 mg by mouth daily.   01/29/2017 at Unknown time  . Multiple Vitamins-Minerals (ICAPS PO) Take 2 tablets by mouth 3 (three) times daily after meals.    01/29/2017 at Unknown time  . pantoprazole (PROTONIX) 40 MG tablet Take 1 tablet (40 mg total) by mouth daily. 90 tablet 3 01/29/2017 at Unknown time  . potassium chloride SA (K-DUR,KLOR-CON) 20 MEQ tablet Take 20 mEq by mouth daily.    01/29/2017 at Unknown time  . spironolactone (ALDACTONE) 25 MG tablet TAKE ONE TABLET BY MOUTH ONCE DAILY 45 tablet 3 01/29/2017 at Unknown time  . telmisartan (MICARDIS) 40 MG tablet Take 40 mg by mouth daily.   01/29/2017 at Unknown time  . torsemide (DEMADEX) 100 MG tablet Take 50 mg by mouth daily.    01/29/2017 at Unknown time  . warfarin (COUMADIN) 5 MG tablet Take 1 tablet daily except 1/2 tablet on Mondays, Wednesdays and Fridays (Patient  taking differently: Take 1 tablet daily except 1/2 tablet on sundays, Tuesday, thursday and saturdays) 30 tablet 12 01/29/2017 at 1500    Assessment: 83yo male on chronic Coumadin PTA.  Home dose listed above.  INR therapeutic on admission (will provide VTE prophylaxis).   Goal of Therapy:  INR 2-3 Monitor platelets by anticoagulation protocol: Yes   Plan:  Coumadin 5mg  po today x 1 D/C Lovenox which was for DVT prophylaxis INR daily  [place] Crane, [place] Gilham A 02/01/2017,10:47 AM

## 2017-02-02 LAB — GLUCOSE, CAPILLARY
GLUCOSE-CAPILLARY: 105 mg/dL — AB (ref 65–99)
GLUCOSE-CAPILLARY: 134 mg/dL — AB (ref 65–99)
Glucose-Capillary: 164 mg/dL — ABNORMAL HIGH (ref 65–99)
Glucose-Capillary: 124 mg/dL — ABNORMAL HIGH (ref 65–99)

## 2017-02-02 LAB — OCCULT BLOOD X 1 CARD TO LAB, STOOL: Fecal Occult Bld: POSITIVE — AB

## 2017-02-02 LAB — CBC WITH DIFFERENTIAL/PLATELET
Basophils Absolute: 0 10*3/uL (ref 0.0–0.1)
Basophils Relative: 0 %
EOS ABS: 0.1 10*3/uL (ref 0.0–0.7)
Eosinophils Relative: 1 %
HCT: 27.9 % — ABNORMAL LOW (ref 39.0–52.0)
HEMOGLOBIN: 9.4 g/dL — AB (ref 13.0–17.0)
LYMPHS ABS: 1.1 10*3/uL (ref 0.7–4.0)
LYMPHS PCT: 13 %
MCH: 32.1 pg (ref 26.0–34.0)
MCHC: 33.7 g/dL (ref 30.0–36.0)
MCV: 95.2 fL (ref 78.0–100.0)
Monocytes Absolute: 1.1 10*3/uL — ABNORMAL HIGH (ref 0.1–1.0)
Monocytes Relative: 13 %
NEUTROS ABS: 5.9 10*3/uL (ref 1.7–7.7)
NEUTROS PCT: 73 %
Platelets: 164 10*3/uL (ref 150–400)
RBC: 2.93 MIL/uL — AB (ref 4.22–5.81)
RDW: 13.6 % (ref 11.5–15.5)
WBC: 8.1 10*3/uL (ref 4.0–10.5)

## 2017-02-02 LAB — BASIC METABOLIC PANEL
Anion gap: 7 (ref 5–15)
BUN: 33 mg/dL — ABNORMAL HIGH (ref 6–20)
CALCIUM: 8.1 mg/dL — AB (ref 8.9–10.3)
CHLORIDE: 99 mmol/L — AB (ref 101–111)
CO2: 27 mmol/L (ref 22–32)
Creatinine, Ser: 1.44 mg/dL — ABNORMAL HIGH (ref 0.61–1.24)
GFR calc Af Amer: 50 mL/min — ABNORMAL LOW (ref >60)
GFR, EST NON AFRICAN AMERICAN: 43 mL/min — AB (ref >60)
Glucose, Bld: 116 mg/dL — ABNORMAL HIGH (ref 65–99)
POTASSIUM: 4.8 mmol/L (ref 3.5–5.1)
SODIUM: 133 mmol/L — AB (ref 135–145)

## 2017-02-02 LAB — PROTIME-INR
INR: 2.15
PROTHROMBIN TIME: 24.4 s — AB (ref 11.4–15.2)

## 2017-02-02 LAB — HEMOGLOBIN AND HEMATOCRIT, BLOOD
HEMATOCRIT: 27.1 % — AB (ref 39.0–52.0)
HEMOGLOBIN: 9 g/dL — AB (ref 13.0–17.0)

## 2017-02-02 MED ORDER — FUROSEMIDE 10 MG/ML IJ SOLN
40.0000 mg | Freq: Two times a day (BID) | INTRAMUSCULAR | Status: DC
  Administered 2017-02-02 – 2017-02-06 (×9): 40 mg via INTRAVENOUS
  Filled 2017-02-02 (×10): qty 4

## 2017-02-02 MED ORDER — LEVALBUTEROL HCL 0.63 MG/3ML IN NEBU
0.6300 mg | INHALATION_SOLUTION | Freq: Three times a day (TID) | RESPIRATORY_TRACT | Status: DC
  Administered 2017-02-02 (×2): 0.63 mg via RESPIRATORY_TRACT
  Filled 2017-02-02 (×3): qty 3

## 2017-02-02 MED ORDER — LEVALBUTEROL HCL 0.63 MG/3ML IN NEBU
0.6300 mg | INHALATION_SOLUTION | Freq: Three times a day (TID) | RESPIRATORY_TRACT | Status: DC
  Administered 2017-02-02 – 2017-02-05 (×8): 0.63 mg via RESPIRATORY_TRACT
  Filled 2017-02-02 (×8): qty 3

## 2017-02-02 MED ORDER — WARFARIN SODIUM 2.5 MG PO TABS
2.5000 mg | ORAL_TABLET | Freq: Once | ORAL | Status: DC
  Filled 2017-02-02: qty 1

## 2017-02-02 MED ORDER — IRBESARTAN 75 MG PO TABS: 75.0000 mg | ORAL_TABLET | Freq: Every day | ORAL | Status: DC

## 2017-02-02 MED ORDER — LEVALBUTEROL HCL 0.63 MG/3ML IN NEBU
0.6300 mg | INHALATION_SOLUTION | Freq: Four times a day (QID) | RESPIRATORY_TRACT | Status: DC | PRN
  Administered 2017-02-03 – 2017-02-05 (×2): 0.63 mg via RESPIRATORY_TRACT
  Filled 2017-02-02 (×2): qty 3

## 2017-02-02 MED ORDER — LEVALBUTEROL HCL 0.63 MG/3ML IN NEBU
INHALATION_SOLUTION | RESPIRATORY_TRACT | Status: AC
  Administered 2017-02-02: 0.63 mg via RESPIRATORY_TRACT
  Filled 2017-02-02: qty 3

## 2017-02-02 NOTE — Progress Notes (Signed)
  Subjective:  Patient states he is feeling better. He has good appetite. He is not getting short of breath when he walks to the bathroom. He denies abdominal pain. He states he's had 2 small bowel movements today. He denies melena or rectal bleeding.  Objective:  Blood pressure (!) 110/43, pulse 64, temperature 98.4 F (36.9 C), temperature source Oral, resp. rate 18, height 5\' 9"  (1.753 m), weight 253 lb 4.8 oz (114.9 kg), SpO2 93 %. Patient is alert and in no acute distress. Breath sounds are diminished at both bases. Abdomen is full. Unlikely hernia is unchanged. It is soft and nontender. No organomegaly or masses. LE edema 2+.  Labs/studies Results:   Recent Labs  01/30/17 1330 01/31/17 0542 02/01/17 0544 02/02/17 0600  WBC 9.2 11.2*  --  8.1  HGB 10.8* 10.2* 9.8* 9.4*  HCT 31.1* 29.7* 29.2* 27.9*  PLT 184 179  --  164    BMET   Recent Labs  01/31/17 0542 02/01/17 0544 02/02/17 0600  NA 127* 130* 133*  K 5.1 4.9 4.8  CL 94* 98* 99*  CO2 27 27 27   GLUCOSE 75 109* 116*  BUN 57* 33* 33*  CREATININE 1.47* 1.16 1.44*  CALCIUM 8.2* 7.9* 8.1*    LFT   Recent Labs  01/30/17 1330  PROT 6.7  ALBUMIN 3.5  AST 16  ALT 20  ALKPHOS 27*  BILITOT 0.8    PT/INR   Recent Labs  02/01/17 0922 02/02/17 0600  LABPROT 26.8* 24.4*  INR 2.43 2.15    GI pathogen panel is negative.  Assessment:  #1. Acute diarrhea. Stool studies are negative. Bowel frequency has decreased and consistency is improving. Therefore no further workup at this time.  #2. Anemia. Hemoglobin on admission was 10.8 g and today is 9.4. His stool was guaiac positive. Patient is anticoagulated. He is on warfarin. Will recheck stool and determine need for further workup.  #3. Exertional dyspnea improved now he is back on diuretic therapy and he is also receiving bronchodilator. Breathing difficulty felt to be due to diastolic dysfunction and COPD according to Dr. Luan Pulling.  #4. Hyponatremia has  improved. Serum sodium now is 133.  Recommendations:  H&H in a.m. Repeat stool guaiac today.

## 2017-02-02 NOTE — Progress Notes (Signed)
Positive hemoccult and most recent hgb reported to Dr Luan Pulling, orders given to hold coumadin this evening and for repeat CBC in AM.  Coumadin wasted.

## 2017-02-02 NOTE — Progress Notes (Addendum)
Subjective: He says he feels okay. He is still short of breath with minimal exertion. He still has some mushy but not loose stools. No chest pain. No nausea or vomiting.  Objective: Vital signs in last 24 hours: Temp:  [98.4 F (36.9 C)-98.9 F (37.2 C)] 98.4 F (36.9 C) (08/11 0445) Pulse Rate:  [42-64] 64 (08/11 0445) Resp:  [18-20] 18 (08/11 0445) BP: (92-120)/(43-57) 110/43 (08/11 0445) SpO2:  [93 %-95 %] 93 % (08/11 0445) Weight:  [114.9 kg (253 lb 4.8 oz)] 114.9 kg (253 lb 4.8 oz) (08/11 0445) Weight change: -1.497 kg (-3 lb 4.8 oz) Last BM Date: 02/02/17  Intake/Output from previous day: 08/10 0701 - 08/11 0700 In: 885 [P.O.:480; I.V.:405] Out: -   PHYSICAL EXAM General appearance: alert, cooperative and mild distress Resp: rhonchi bilaterally Cardio: irregularly irregular rhythm GI: soft, non-tender; bowel sounds normal; no masses,  no organomegaly Extremities: 2+ edema skin warm and dry  Lab Results:  Results for orders placed or performed during the hospital encounter of 01/30/17 (from the past 48 hour(s))  Glucose, capillary     Status: Abnormal   Collection Time: 01/31/17 11:29 AM  Result Value Ref Range   Glucose-Capillary 118 (H) 65 - 99 mg/dL  Gastrointestinal Panel by PCR , Stool     Status: None   Collection Time: 01/31/17 12:44 PM  Result Value Ref Range   Campylobacter species NOT DETECTED NOT DETECTED   Plesimonas shigelloides NOT DETECTED NOT DETECTED   Salmonella species NOT DETECTED NOT DETECTED   Yersinia enterocolitica NOT DETECTED NOT DETECTED   Vibrio species NOT DETECTED NOT DETECTED   Vibrio cholerae NOT DETECTED NOT DETECTED   Enteroaggregative E coli (EAEC) NOT DETECTED NOT DETECTED   Enteropathogenic E coli (EPEC) NOT DETECTED NOT DETECTED   Enterotoxigenic E coli (ETEC) NOT DETECTED NOT DETECTED   Shiga like toxin producing E coli (STEC) NOT DETECTED NOT DETECTED   Shigella/Enteroinvasive E coli (EIEC) NOT DETECTED NOT DETECTED   Cryptosporidium NOT DETECTED NOT DETECTED   Cyclospora cayetanensis NOT DETECTED NOT DETECTED   Entamoeba histolytica NOT DETECTED NOT DETECTED   Giardia lamblia NOT DETECTED NOT DETECTED   Adenovirus F40/41 NOT DETECTED NOT DETECTED   Astrovirus NOT DETECTED NOT DETECTED   Norovirus GI/GII NOT DETECTED NOT DETECTED   Rotavirus A NOT DETECTED NOT DETECTED   Sapovirus (I, II, IV, and V) NOT DETECTED NOT DETECTED  Glucose, capillary     Status: None   Collection Time: 01/31/17  4:14 PM  Result Value Ref Range   Glucose-Capillary 94 65 - 99 mg/dL  Glucose, capillary     Status: Abnormal   Collection Time: 01/31/17  8:50 PM  Result Value Ref Range   Glucose-Capillary 182 (H) 65 - 99 mg/dL   Comment 1 Notify RN    Comment 2 Document in Chart   Hemoglobin and hematocrit, blood     Status: Abnormal   Collection Time: 02/01/17  5:44 AM  Result Value Ref Range   Hemoglobin 9.8 (L) 13.0 - 17.0 g/dL   HCT 29.2 (L) 39.0 - 38.1 %  Basic metabolic panel     Status: Abnormal   Collection Time: 02/01/17  5:44 AM  Result Value Ref Range   Sodium 130 (L) 135 - 145 mmol/L   Potassium 4.9 3.5 - 5.1 mmol/L   Chloride 98 (L) 101 - 111 mmol/L   CO2 27 22 - 32 mmol/L   Glucose, Bld 109 (H) 65 - 99 mg/dL   BUN  33 (H) 6 - 20 mg/dL   Creatinine, Ser 1.16 0.61 - 1.24 mg/dL   Calcium 7.9 (L) 8.9 - 10.3 mg/dL   GFR calc non Af Amer 56 (L) >60 mL/min   GFR calc Af Amer >60 >60 mL/min    Comment: (NOTE) The eGFR has been calculated using the CKD EPI equation. This calculation has not been validated in all clinical situations. eGFR's persistently <60 mL/min signify possible Chronic Kidney Disease.    Anion gap 5 5 - 15  Glucose, capillary     Status: Abnormal   Collection Time: 02/01/17  7:53 AM  Result Value Ref Range   Glucose-Capillary 108 (H) 65 - 99 mg/dL   Comment 1 Notify RN    Comment 2 Document in Chart   Protime-INR     Status: Abnormal   Collection Time: 02/01/17  9:22 AM  Result Value  Ref Range   Prothrombin Time 26.8 (H) 11.4 - 15.2 seconds   INR 2.43   Glucose, capillary     Status: Abnormal   Collection Time: 02/01/17 12:02 PM  Result Value Ref Range   Glucose-Capillary 184 (H) 65 - 99 mg/dL   Comment 1 Notify RN    Comment 2 Document in Chart   Glucose, capillary     Status: Abnormal   Collection Time: 02/01/17  4:46 PM  Result Value Ref Range   Glucose-Capillary 101 (H) 65 - 99 mg/dL   Comment 1 Notify RN    Comment 2 Document in Chart   Glucose, capillary     Status: Abnormal   Collection Time: 02/01/17  8:50 PM  Result Value Ref Range   Glucose-Capillary 164 (H) 65 - 99 mg/dL   Comment 1 Notify RN    Comment 2 Document in Chart   CBC with Differential/Platelet     Status: Abnormal   Collection Time: 02/02/17  6:00 AM  Result Value Ref Range   WBC 8.1 4.0 - 10.5 K/uL   RBC 2.93 (L) 4.22 - 5.81 MIL/uL   Hemoglobin 9.4 (L) 13.0 - 17.0 g/dL   HCT 27.9 (L) 39.0 - 52.0 %   MCV 95.2 78.0 - 100.0 fL   MCH 32.1 26.0 - 34.0 pg   MCHC 33.7 30.0 - 36.0 g/dL   RDW 13.6 11.5 - 15.5 %   Platelets 164 150 - 400 K/uL   Neutrophils Relative % 73 %   Neutro Abs 5.9 1.7 - 7.7 K/uL   Lymphocytes Relative 13 %   Lymphs Abs 1.1 0.7 - 4.0 K/uL   Monocytes Relative 13 %   Monocytes Absolute 1.1 (H) 0.1 - 1.0 K/uL   Eosinophils Relative 1 %   Eosinophils Absolute 0.1 0.0 - 0.7 K/uL   Basophils Relative 0 %   Basophils Absolute 0.0 0.0 - 0.1 K/uL  Basic metabolic panel     Status: Abnormal   Collection Time: 02/02/17  6:00 AM  Result Value Ref Range   Sodium 133 (L) 135 - 145 mmol/L   Potassium 4.8 3.5 - 5.1 mmol/L   Chloride 99 (L) 101 - 111 mmol/L   CO2 27 22 - 32 mmol/L   Glucose, Bld 116 (H) 65 - 99 mg/dL   BUN 33 (H) 6 - 20 mg/dL   Creatinine, Ser 1.44 (H) 0.61 - 1.24 mg/dL   Calcium 8.1 (L) 8.9 - 10.3 mg/dL   GFR calc non Af Amer 43 (L) >60 mL/min   GFR calc Af Amer 50 (L) >60 mL/min  Comment: (NOTE) The eGFR has been calculated using the CKD EPI  equation. This calculation has not been validated in all clinical situations. eGFR's persistently <60 mL/min signify possible Chronic Kidney Disease.    Anion gap 7 5 - 15  Protime-INR     Status: Abnormal   Collection Time: 02/02/17  6:00 AM  Result Value Ref Range   Prothrombin Time 24.4 (H) 11.4 - 15.2 seconds   INR 2.15   Glucose, capillary     Status: Abnormal   Collection Time: 02/02/17  7:28 AM  Result Value Ref Range   Glucose-Capillary 105 (H) 65 - 99 mg/dL   Comment 1 Notify RN    Comment 2 Document in Chart     ABGS No results for input(s): PHART, PO2ART, TCO2, HCO3 in the last 72 hours.  Invalid input(s): PCO2 CULTURES Recent Results (from the past 240 hour(s))  C difficile quick scan w PCR reflex     Status: None   Collection Time: 01/30/17  6:30 PM  Result Value Ref Range Status   C Diff antigen NEGATIVE NEGATIVE Final   C Diff toxin NEGATIVE NEGATIVE Final   C Diff interpretation No C. difficile detected.  Final  Gastrointestinal Panel by PCR , Stool     Status: None   Collection Time: 01/31/17 12:44 PM  Result Value Ref Range Status   Campylobacter species NOT DETECTED NOT DETECTED Final   Plesimonas shigelloides NOT DETECTED NOT DETECTED Final   Salmonella species NOT DETECTED NOT DETECTED Final   Yersinia enterocolitica NOT DETECTED NOT DETECTED Final   Vibrio species NOT DETECTED NOT DETECTED Final   Vibrio cholerae NOT DETECTED NOT DETECTED Final   Enteroaggregative E coli (EAEC) NOT DETECTED NOT DETECTED Final   Enteropathogenic E coli (EPEC) NOT DETECTED NOT DETECTED Final   Enterotoxigenic E coli (ETEC) NOT DETECTED NOT DETECTED Final   Shiga like toxin producing E coli (STEC) NOT DETECTED NOT DETECTED Final   Shigella/Enteroinvasive E coli (EIEC) NOT DETECTED NOT DETECTED Final   Cryptosporidium NOT DETECTED NOT DETECTED Final   Cyclospora cayetanensis NOT DETECTED NOT DETECTED Final   Entamoeba histolytica NOT DETECTED NOT DETECTED Final    Giardia lamblia NOT DETECTED NOT DETECTED Final   Adenovirus F40/41 NOT DETECTED NOT DETECTED Final   Astrovirus NOT DETECTED NOT DETECTED Final   Norovirus GI/GII NOT DETECTED NOT DETECTED Final   Rotavirus A NOT DETECTED NOT DETECTED Final   Sapovirus (I, II, IV, and V) NOT DETECTED NOT DETECTED Final   Studies/Results: No results found.  Medications:  Prior to Admission:  Prescriptions Prior to Admission  Medication Sig Dispense Refill Last Dose  . amLODipine (NORVASC) 10 MG tablet TAKE ONE TABLET BY MOUTH ONCE DAILY 90 tablet 1 01/29/2017 at Unknown time  . amoxicillin (AMOXIL) 500 MG capsule Take 2,000 mg by mouth as directed.    Taking  . atorvastatin (LIPITOR) 20 MG tablet TAKE ONE TABLET BY MOUTH ONCE DAILY 90 tablet 3 01/29/2017 at Unknown time  . carvedilol (COREG) 12.5 MG tablet TAKE ONE TABLET BY MOUTH TWICE DAILY 180 tablet 3 01/29/2017 at 1900  . Coenzyme Q10 (CO Q 10) 100 MG CAPS Take 1 capsule by mouth every morning.   01/29/2017 at Unknown time  . dorzolamide-timolol (COSOPT) 22.3-6.8 MG/ML ophthalmic solution Place 1 drop into both eyes 2 (two) times daily.   01/29/2017 at Unknown time  . glimepiride (AMARYL) 2 MG tablet Take 2 mg by mouth daily before breakfast.    01/29/2017 at  Unknown time  . magnesium oxide (MAG-OX) 400 MG tablet Take 400 mg by mouth daily.   01/29/2017 at Unknown time  . Multiple Vitamins-Minerals (ICAPS PO) Take 2 tablets by mouth 3 (three) times daily after meals.    01/29/2017 at Unknown time  . pantoprazole (PROTONIX) 40 MG tablet Take 1 tablet (40 mg total) by mouth daily. 90 tablet 3 01/29/2017 at Unknown time  . potassium chloride SA (K-DUR,KLOR-CON) 20 MEQ tablet Take 20 mEq by mouth daily.    01/29/2017 at Unknown time  . spironolactone (ALDACTONE) 25 MG tablet TAKE ONE TABLET BY MOUTH ONCE DAILY 45 tablet 3 01/29/2017 at Unknown time  . telmisartan (MICARDIS) 40 MG tablet Take 40 mg by mouth daily.   01/29/2017 at Unknown time  . torsemide (DEMADEX) 100 MG tablet  Take 50 mg by mouth daily.    01/29/2017 at Unknown time  . warfarin (COUMADIN) 5 MG tablet Take 1 tablet daily except 1/2 tablet on Mondays, Wednesdays and Fridays (Patient taking differently: Take 1 tablet daily except 1/2 tablet on sundays, Tuesday, thursday and saturdays) 30 tablet 12 01/29/2017 at 1500   Scheduled: . atorvastatin  20 mg Oral Daily  . carvedilol  12.5 mg Oral BID  . dorzolamide-timolol  1 drop Both Eyes BID  . furosemide  40 mg Intravenous Q12H  . insulin aspart  0-15 Units Subcutaneous TID WC  . insulin aspart  0-5 Units Subcutaneous QHS  . levalbuterol  0.63 mg Nebulization Q8H  . multivitamin-lutein  1 capsule Oral TID PC  . pantoprazole  40 mg Oral Daily  . spironolactone  25 mg Oral Daily  . warfarin  2.5 mg Oral Once  . Warfarin - Pharmacist Dosing Inpatient   Does not apply Q24H   Continuous:  PRN:  Assesment: He was admitted with diarrhea and dehydration. He had acute kidney injury from that. I had held his diuretics. He's back on diuretics now and I think his edema is a little bit less. He is still short of breath I think from a combination of chronic diastolic heart failure and COPD. His diarrhea is improving. He has relatively low diastolic blood pressure Active Problems:   HYPERTENSION, BENIGN   S/P AVR   CORONARY ARTERY BYPASS GRAFT, HX OF   Hyponatremia   COPD (chronic obstructive pulmonary disease) (HCC)   Bilateral leg edema   Diarrhea   Chronic diastolic heart failure (HCC)   Dehydration   Acute kidney injury ([place])    Plan: I'm going to discontinue Avapro. Continue with diuresis but try IV diuresis now. Recheck blood in the morning. Add xopenex treatments.    LOS: 3 days   Gunda Maqueda L 02/02/2017, 10:00 AM

## 2017-02-02 NOTE — Progress Notes (Signed)
ANTICOAGULATION CONSULT NOTE -   Pharmacy Consult for Coumadin (home med) Indication: atrial fibrillation  Allergies  Allergen Reactions  . Aspirin     Unknown, per pt  . Demerol     Unknown, per pt   Patient Measurements: Height: 5\' 9"  (175.3 cm) Weight: 253 lb 4.8 oz (114.9 kg) IBW/kg (Calculated) : 70.7  Vital Signs: Temp: 98.4 F (36.9 C) (08/11 0445) Temp Source: Oral (08/11 0445) BP: 110/43 (08/11 0445) Pulse Rate: 64 (08/11 0445)  Labs:  Recent Labs  01/30/17 1330 01/31/17 0542 02/01/17 0544 02/01/17 0922 02/02/17 0600  HGB 10.8* 10.2* 9.8*  --  9.4*  HCT 31.1* 29.7* 29.2*  --  27.9*  PLT 184 179  --   --  164  LABPROT  --   --   --  26.8* 24.4*  INR  --   --   --  2.43 2.15  CREATININE 1.84* 1.47* 1.16  --  1.44*   Estimated Creatinine Clearance: 48.6 mL/min (A) (by C-G formula based on SCr of 1.44 mg/dL (H)).  Medical History: Past Medical History:  Diagnosis Date  . Aortic aneurysm (HCC)    Ascending thoracic - 4.8 cm by CT 2017  . Aortic regurgitation    Bioprosthetic AVR 2006  . Atrial fibrillation with controlled ventricular response ([place]) 08/22/2015   CHADS2VASC=5, on Coumadin  . CAD (coronary artery disease)    Multivessel status post CABG 2006  . Esophageal reflux   . Essential hypertension   . History of dizziness   . Mixed hyperlipidemia   . Type 2 diabetes mellitus (HCC)    Medications:  Prescriptions Prior to Admission  Medication Sig Dispense Refill Last Dose  . amLODipine (NORVASC) 10 MG tablet TAKE ONE TABLET BY MOUTH ONCE DAILY 90 tablet 1 01/29/2017 at Unknown time  . amoxicillin (AMOXIL) 500 MG capsule Take 2,000 mg by mouth as directed.    Taking  . atorvastatin (LIPITOR) 20 MG tablet TAKE ONE TABLET BY MOUTH ONCE DAILY 90 tablet 3 01/29/2017 at Unknown time  . carvedilol (COREG) 12.5 MG tablet TAKE ONE TABLET BY MOUTH TWICE DAILY 180 tablet 3 01/29/2017 at 1900  . Coenzyme Q10 (CO Q 10) 100 MG CAPS Take 1 capsule by mouth every  morning.   01/29/2017 at Unknown time  . dorzolamide-timolol (COSOPT) 22.3-6.8 MG/ML ophthalmic solution Place 1 drop into both eyes 2 (two) times daily.   01/29/2017 at Unknown time  . glimepiride (AMARYL) 2 MG tablet Take 2 mg by mouth daily before breakfast.    01/29/2017 at Unknown time  . magnesium oxide (MAG-OX) 400 MG tablet Take 400 mg by mouth daily.   01/29/2017 at Unknown time  . Multiple Vitamins-Minerals (ICAPS PO) Take 2 tablets by mouth 3 (three) times daily after meals.    01/29/2017 at Unknown time  . pantoprazole (PROTONIX) 40 MG tablet Take 1 tablet (40 mg total) by mouth daily. 90 tablet 3 01/29/2017 at Unknown time  . potassium chloride SA (K-DUR,KLOR-CON) 20 MEQ tablet Take 20 mEq by mouth daily.    01/29/2017 at Unknown time  . spironolactone (ALDACTONE) 25 MG tablet TAKE ONE TABLET BY MOUTH ONCE DAILY 45 tablet 3 01/29/2017 at Unknown time  . telmisartan (MICARDIS) 40 MG tablet Take 40 mg by mouth daily.   01/29/2017 at Unknown time  . torsemide (DEMADEX) 100 MG tablet Take 50 mg by mouth daily.    01/29/2017 at Unknown time  . warfarin (COUMADIN) 5 MG tablet Take 1 tablet daily  except 1/2 tablet on Mondays, Wednesdays and Fridays (Patient taking differently: Take 1 tablet daily except 1/2 tablet on sundays, Tuesday, thursday and saturdays) 30 tablet 12 01/29/2017 at 1500   Assessment: 83yo male on chronic Coumadin PTA.  Home dose listed above.  INR therapeutic on admission (will provide VTE prophylaxis therefore Lovenox d/c'd).   Goal of Therapy:  INR 2-3 Monitor platelets by anticoagulation protocol: Yes   Plan:  Coumadin 2.5mg  po today x 1 (home dose) D/C Lovenox which was for DVT prophylaxis INR daily  [place] Crane, Josejuan Hoaglin A 02/02/2017,9:18 AM

## 2017-02-03 LAB — CBC WITH DIFFERENTIAL/PLATELET
Basophils Absolute: 0 10*3/uL (ref 0.0–0.1)
Basophils Relative: 0 %
EOS ABS: 0.2 10*3/uL (ref 0.0–0.7)
Eosinophils Relative: 4 %
HCT: 27.7 % — ABNORMAL LOW (ref 39.0–52.0)
HEMOGLOBIN: 9.5 g/dL — AB (ref 13.0–17.0)
LYMPHS ABS: 1.3 10*3/uL (ref 0.7–4.0)
LYMPHS PCT: 21 %
MCH: 32.8 pg (ref 26.0–34.0)
MCHC: 34.3 g/dL (ref 30.0–36.0)
MCV: 95.5 fL (ref 78.0–100.0)
Monocytes Absolute: 0.9 10*3/uL (ref 0.1–1.0)
Monocytes Relative: 15 %
NEUTROS ABS: 3.6 10*3/uL (ref 1.7–7.7)
NEUTROS PCT: 60 %
Platelets: 155 10*3/uL (ref 150–400)
RBC: 2.9 MIL/uL — AB (ref 4.22–5.81)
RDW: 13.7 % (ref 11.5–15.5)
WBC: 6 10*3/uL (ref 4.0–10.5)

## 2017-02-03 LAB — GLUCOSE, CAPILLARY
GLUCOSE-CAPILLARY: 165 mg/dL — AB (ref 65–99)
Glucose-Capillary: 92 mg/dL (ref 65–99)
Glucose-Capillary: 159 mg/dL — ABNORMAL HIGH (ref 65–99)
Glucose-Capillary: 144 mg/dL — ABNORMAL HIGH (ref 65–99)

## 2017-02-03 LAB — BASIC METABOLIC PANEL
Anion gap: 7 (ref 5–15)
BUN: 32 mg/dL — AB (ref 6–20)
CHLORIDE: 98 mmol/L — AB (ref 101–111)
CO2: 25 mmol/L (ref 22–32)
Calcium: 7.9 mg/dL — ABNORMAL LOW (ref 8.9–10.3)
Creatinine, Ser: 1.34 mg/dL — ABNORMAL HIGH (ref 0.61–1.24)
GFR calc Af Amer: 55 mL/min — ABNORMAL LOW (ref >60)
GFR calc non Af Amer: 47 mL/min — ABNORMAL LOW (ref >60)
Glucose, Bld: 95 mg/dL (ref 65–99)
POTASSIUM: 4.3 mmol/L (ref 3.5–5.1)
SODIUM: 130 mmol/L — AB (ref 135–145)

## 2017-02-03 LAB — PROTIME-INR
INR: 2.24
PROTHROMBIN TIME: 25.2 s — AB (ref 11.4–15.2)

## 2017-02-03 MED ORDER — WARFARIN SODIUM 2.5 MG PO TABS
2.5000 mg | ORAL_TABLET | Freq: Once | ORAL | Status: AC
  Administered 2017-02-03: 2.5 mg via ORAL
  Filled 2017-02-03: qty 1

## 2017-02-03 NOTE — Progress Notes (Signed)
Rechecked SpO2, currently 92% on RA. RT will continue to monitor.

## 2017-02-03 NOTE — Progress Notes (Signed)
Patient seen earlier today.   Subjective:  Patient has no complaints. He is only having small bowel movements. Stool is not losing watery. He denies melena or rectal bleeding. He also denies abdominal pain. Patient's wife is concerned that he may have setback with GoLYTELY prep and therefore would like to delay colonoscopy for now.  Objective:  Blood pressure (!) 103/52, pulse (!) 59, temperature 98.2 F (36.8 C), temperature source Oral, resp. rate 18, height 5\' 9"  (1.753 m), weight 253 lb 1.4 oz (114.8 kg), SpO2 94 %. Patient is alert and in no acute distress. He has abdominal thoracic breathing pattern Abdomen is full. Soft umbilicus hernia about the size of golf ball. It is partially reducible.. It is soft and nontender. No organomegaly or masses. LE edema 2+.  Labs/studies Results:   Recent Labs  02/02/17 0600 02/02/17 1321 02/03/17 0629  WBC 8.1  --  6.0  HGB 9.4* 9.0* 9.5*  HCT 27.9* 27.1* 27.7*  PLT 164  --  155    BMET   Recent Labs  02/01/17 0544 02/02/17 0600 02/03/17 0629  NA 130* 133* 130*  K 4.9 4.8 4.3  CL 98* 99* 98*  CO2 27 27 25   GLUCOSE 109* 116* 95  BUN 33* 33* 32*  CREATININE 1.16 1.44* 1.34*  CALCIUM 7.9* 8.1* 7.9*    LFT  No results for input(s): PROT, ALBUMIN, AST, ALT, ALKPHOS, BILITOT, BILIDIR, IBILI in the last 72 hours.  PT/INR   Recent Labs  02/02/17 0600 02/03/17 0629  LABPROT 24.4* 25.2*  INR 2.15 2.24      Assessment:  #1. Acute diarrhea. Stool studies are negative. Total frequency has slowed without symptomatic therapy. Oral intake is satisfactory. Suspect self-limiting diarrhea possibly due to infection not picked up by stool studies.  #2. Anemia. Stool is guaiac positive. He is not having frank GI bleeding. Hemoglobin has remained stable over the last 24 hours. Heme-positive stool may be hard and parcel of acute diarrhea since/self-limiting colitis or other etiologies. Further workup would include colonoscopy patient's  wife would like to hold off.   Recommendations:  Will check H&H in a.m. As discussed with Dr. Luan Pulling warfarin has been resumed.

## 2017-02-03 NOTE — Progress Notes (Signed)
ANTICOAGULATION CONSULT NOTE -   Pharmacy Consult for Coumadin (currently on hold pending GI w/u) Indication: atrial fibrillation  Allergies  Allergen Reactions  . Aspirin     Unknown, per pt  . Demerol     Unknown, per pt   Patient Measurements: Height: 5\' 9"  (175.3 cm) Weight: 253 lb 1.4 oz (114.8 kg) IBW/kg (Calculated) : 70.7  Vital Signs: Temp: 97.8 F (36.6 C) (08/12 0451) Temp Source: Oral (08/12 0451) BP: 89/49 (08/12 0451) Pulse Rate: 82 (08/12 0754)  Labs:  Recent Labs  02/01/17 0544 02/01/17 0922 02/02/17 0600 02/02/17 1321 02/03/17 0629  HGB 9.8*  --  9.4* 9.0* 9.5*  HCT 29.2*  --  27.9* 27.1* 27.7*  PLT  --   --  164  --  155  LABPROT  --  26.8* 24.4*  --  25.2*  INR  --  2.43 2.15  --  2.24  CREATININE 1.16  --  1.44*  --  1.34*   Estimated Creatinine Clearance: 52.2 mL/min (A) (by C-G formula based on SCr of 1.34 mg/dL (H)).  Medical History: Past Medical History:  Diagnosis Date  . Aortic aneurysm (HCC)    Ascending thoracic - 4.8 cm by CT 2017  . Aortic regurgitation    Bioprosthetic AVR 2006  . Atrial fibrillation with controlled ventricular response ([place]) 08/22/2015   CHADS2VASC=5, on Coumadin  . CAD (coronary artery disease)    Multivessel status post CABG 2006  . Esophageal reflux   . Essential hypertension   . History of dizziness   . Mixed hyperlipidemia   . Type 2 diabetes mellitus (HCC)    Medications:  Prescriptions Prior to Admission  Medication Sig Dispense Refill Last Dose  . amLODipine (NORVASC) 10 MG tablet TAKE ONE TABLET BY MOUTH ONCE DAILY 90 tablet 1 01/29/2017 at Unknown time  . amoxicillin (AMOXIL) 500 MG capsule Take 2,000 mg by mouth as directed.    Taking  . atorvastatin (LIPITOR) 20 MG tablet TAKE ONE TABLET BY MOUTH ONCE DAILY 90 tablet 3 01/29/2017 at Unknown time  . carvedilol (COREG) 12.5 MG tablet TAKE ONE TABLET BY MOUTH TWICE DAILY 180 tablet 3 01/29/2017 at 1900  . Coenzyme Q10 (CO Q 10) 100 MG CAPS Take 1  capsule by mouth every morning.   01/29/2017 at Unknown time  . dorzolamide-timolol (COSOPT) 22.3-6.8 MG/ML ophthalmic solution Place 1 drop into both eyes 2 (two) times daily.   01/29/2017 at Unknown time  . glimepiride (AMARYL) 2 MG tablet Take 2 mg by mouth daily before breakfast.    01/29/2017 at Unknown time  . magnesium oxide (MAG-OX) 400 MG tablet Take 400 mg by mouth daily.   01/29/2017 at Unknown time  . Multiple Vitamins-Minerals (ICAPS PO) Take 2 tablets by mouth 3 (three) times daily after meals.    01/29/2017 at Unknown time  . pantoprazole (PROTONIX) 40 MG tablet Take 1 tablet (40 mg total) by mouth daily. 90 tablet 3 01/29/2017 at Unknown time  . potassium chloride SA (K-DUR,KLOR-CON) 20 MEQ tablet Take 20 mEq by mouth daily.    01/29/2017 at Unknown time  . spironolactone (ALDACTONE) 25 MG tablet TAKE ONE TABLET BY MOUTH ONCE DAILY 45 tablet 3 01/29/2017 at Unknown time  . telmisartan (MICARDIS) 40 MG tablet Take 40 mg by mouth daily.   01/29/2017 at Unknown time  . torsemide (DEMADEX) 100 MG tablet Take 50 mg by mouth daily.    01/29/2017 at Unknown time  . warfarin (COUMADIN) 5 MG tablet  Take 1 tablet daily except 1/2 tablet on Mondays, Wednesdays and Fridays (Patient taking differently: Take 1 tablet daily except 1/2 tablet on sundays, Tuesday, thursday and saturdays) 30 tablet 12 01/29/2017 at 1500   Assessment: 83yo male on chronic Coumadin PTA.  Home dose listed above.  INR therapeutic on admission (will provide VTE prophylaxis therefore Lovenox d/c'd).  D/W Dr Luan Pulling, plan to HOLD coumadin for now pending GI w/u  Goal of Therapy:  INR 2-3 Monitor platelets by anticoagulation protocol: Yes   Plan:  HOLD coumadin, f/u GI recommendations INR daily  Hart Robinsons A 02/03/2017,10:01 AM

## 2017-02-03 NOTE — Progress Notes (Signed)
Subjective: He says he feels substantially better. His hemoglobin level has gone up. He is less short of breath. No other new complaints.  Objective: Vital signs in last 24 hours: Temp:  [97.8 F (36.6 C)-98.8 F (37.1 C)] 97.8 F (36.6 C) (08/12 0451) Pulse Rate:  [61-82] 82 (08/12 0754) Resp:  [16-18] 18 (08/12 0754) BP: (89-98)/(49-57) 89/49 (08/12 0451) SpO2:  [88 %-94 %] 92 % (08/12 0754) Weight:  [114.8 kg (253 lb 1.4 oz)] 114.8 kg (253 lb 1.4 oz) (08/12 0451) Weight change: -0.096 kg (-3.4 oz) Last BM Date: 02/03/17  Intake/Output from previous day: 08/11 0701 - 08/12 0700 In: 1320 [P.O.:1320] Out: 1250 [Urine:1250]  PHYSICAL EXAM General appearance: alert, cooperative, mild distress and Obese Resp: rhonchi bilaterally Cardio: irregularly irregular rhythm GI: soft, non-tender; bowel sounds normal; no masses,  no organomegaly Extremities: His edema is better Skin warm and dry. Mildly anxious.  Lab Results:  Results for orders placed or performed during the hospital encounter of 01/30/17 (from the past 48 hour(s))  Glucose, capillary     Status: Abnormal   Collection Time: 02/01/17 12:02 PM  Result Value Ref Range   Glucose-Capillary 184 (H) 65 - 99 mg/dL   Comment 1 Notify RN    Comment 2 Document in Chart   Glucose, capillary     Status: Abnormal   Collection Time: 02/01/17  4:46 PM  Result Value Ref Range   Glucose-Capillary 101 (H) 65 - 99 mg/dL   Comment 1 Notify RN    Comment 2 Document in Chart   Glucose, capillary     Status: Abnormal   Collection Time: 02/01/17  8:50 PM  Result Value Ref Range   Glucose-Capillary 164 (H) 65 - 99 mg/dL   Comment 1 Notify RN    Comment 2 Document in Chart   CBC with Differential/Platelet     Status: Abnormal   Collection Time: 02/02/17  6:00 AM  Result Value Ref Range   WBC 8.1 4.0 - 10.5 K/uL   RBC 2.93 (L) 4.22 - 5.81 MIL/uL   Hemoglobin 9.4 (L) 13.0 - 17.0 g/dL   HCT 27.9 (L) 39.0 - 52.0 %   MCV 95.2 78.0 -  100.0 fL   MCH 32.1 26.0 - 34.0 pg   MCHC 33.7 30.0 - 36.0 g/dL   RDW 13.6 11.5 - 15.5 %   Platelets 164 150 - 400 K/uL   Neutrophils Relative % 73 %   Neutro Abs 5.9 1.7 - 7.7 K/uL   Lymphocytes Relative 13 %   Lymphs Abs 1.1 0.7 - 4.0 K/uL   Monocytes Relative 13 %   Monocytes Absolute 1.1 (H) 0.1 - 1.0 K/uL   Eosinophils Relative 1 %   Eosinophils Absolute 0.1 0.0 - 0.7 K/uL   Basophils Relative 0 %   Basophils Absolute 0.0 0.0 - 0.1 K/uL  Basic metabolic panel     Status: Abnormal   Collection Time: 02/02/17  6:00 AM  Result Value Ref Range   Sodium 133 (L) 135 - 145 mmol/L   Potassium 4.8 3.5 - 5.1 mmol/L   Chloride 99 (L) 101 - 111 mmol/L   CO2 27 22 - 32 mmol/L   Glucose, Bld 116 (H) 65 - 99 mg/dL   BUN 33 (H) 6 - 20 mg/dL   Creatinine, Ser 1.44 (H) 0.61 - 1.24 mg/dL   Calcium 8.1 (L) 8.9 - 10.3 mg/dL   GFR calc non Af Amer 43 (L) >60 mL/min   GFR calc Af  Amer 50 (L) >60 mL/min    Comment: (NOTE) The eGFR has been calculated using the CKD EPI equation. This calculation has not been validated in all clinical situations. eGFR's persistently <60 mL/min signify possible Chronic Kidney Disease.    Anion gap 7 5 - 15  Protime-INR     Status: Abnormal   Collection Time: 02/02/17  6:00 AM  Result Value Ref Range   Prothrombin Time 24.4 (H) 11.4 - 15.2 seconds   INR 2.15   Glucose, capillary     Status: Abnormal   Collection Time: 02/02/17  7:28 AM  Result Value Ref Range   Glucose-Capillary 105 (H) 65 - 99 mg/dL   Comment 1 Notify RN    Comment 2 Document in Chart   Glucose, capillary     Status: Abnormal   Collection Time: 02/02/17 11:14 AM  Result Value Ref Range   Glucose-Capillary 164 (H) 65 - 99 mg/dL   Comment 1 Notify RN    Comment 2 Document in Chart   Occult blood card to lab, stool RN will collect     Status: Abnormal   Collection Time: 02/02/17 12:05 PM  Result Value Ref Range   Fecal Occult Bld POSITIVE (A) NEGATIVE  Hemoglobin and hematocrit, blood      Status: Abnormal   Collection Time: 02/02/17  1:21 PM  Result Value Ref Range   Hemoglobin 9.0 (L) 13.0 - 17.0 g/dL   HCT 27.1 (L) 39.0 - 52.0 %  Glucose, capillary     Status: Abnormal   Collection Time: 02/02/17  4:40 PM  Result Value Ref Range   Glucose-Capillary 134 (H) 65 - 99 mg/dL   Comment 1 Notify RN    Comment 2 Document in Chart   Glucose, capillary     Status: Abnormal   Collection Time: 02/02/17  9:26 PM  Result Value Ref Range   Glucose-Capillary 124 (H) 65 - 99 mg/dL   Comment 1 Notify RN    Comment 2 Document in Chart   CBC with Differential/Platelet     Status: Abnormal   Collection Time: 02/03/17  6:29 AM  Result Value Ref Range   WBC 6.0 4.0 - 10.5 K/uL   RBC 2.90 (L) 4.22 - 5.81 MIL/uL   Hemoglobin 9.5 (L) 13.0 - 17.0 g/dL   HCT 27.7 (L) 39.0 - 52.0 %   MCV 95.5 78.0 - 100.0 fL   MCH 32.8 26.0 - 34.0 pg   MCHC 34.3 30.0 - 36.0 g/dL   RDW 13.7 11.5 - 15.5 %   Platelets 155 150 - 400 K/uL   Neutrophils Relative % 60 %   Neutro Abs 3.6 1.7 - 7.7 K/uL   Lymphocytes Relative 21 %   Lymphs Abs 1.3 0.7 - 4.0 K/uL   Monocytes Relative 15 %   Monocytes Absolute 0.9 0.1 - 1.0 K/uL   Eosinophils Relative 4 %   Eosinophils Absolute 0.2 0.0 - 0.7 K/uL   Basophils Relative 0 %   Basophils Absolute 0.0 0.0 - 0.1 K/uL  Basic metabolic panel     Status: Abnormal   Collection Time: 02/03/17  6:29 AM  Result Value Ref Range   Sodium 130 (L) 135 - 145 mmol/L   Potassium 4.3 3.5 - 5.1 mmol/L   Chloride 98 (L) 101 - 111 mmol/L   CO2 25 22 - 32 mmol/L   Glucose, Bld 95 65 - 99 mg/dL   BUN 32 (H) 6 - 20 mg/dL   Creatinine, Ser 1.34 (  H) 0.61 - 1.24 mg/dL   Calcium 7.9 (L) 8.9 - 10.3 mg/dL   GFR calc non Af Amer 47 (L) >60 mL/min   GFR calc Af Amer 55 (L) >60 mL/min    Comment: (NOTE) The eGFR has been calculated using the CKD EPI equation. This calculation has not been validated in all clinical situations. eGFR's persistently <60 mL/min signify possible Chronic  Kidney Disease.    Anion gap 7 5 - 15  Protime-INR     Status: Abnormal   Collection Time: 02/03/17  6:29 AM  Result Value Ref Range   Prothrombin Time 25.2 (H) 11.4 - 15.2 seconds   INR 2.24   Glucose, capillary     Status: None   Collection Time: 02/03/17  7:25 AM  Result Value Ref Range   Glucose-Capillary 92 65 - 99 mg/dL   Comment 1 Notify RN    Comment 2 Document in Chart     ABGS No results for input(s): PHART, PO2ART, TCO2, HCO3 in the last 72 hours.  Invalid input(s): PCO2 CULTURES Recent Results (from the past 240 hour(s))  C difficile quick scan w PCR reflex     Status: None   Collection Time: 01/30/17  6:30 PM  Result Value Ref Range Status   C Diff antigen NEGATIVE NEGATIVE Final   C Diff toxin NEGATIVE NEGATIVE Final   C Diff interpretation No C. difficile detected.  Final  Gastrointestinal Panel by PCR , Stool     Status: None   Collection Time: 01/31/17 12:44 PM  Result Value Ref Range Status   Campylobacter species NOT DETECTED NOT DETECTED Final   Plesimonas shigelloides NOT DETECTED NOT DETECTED Final   Salmonella species NOT DETECTED NOT DETECTED Final   Yersinia enterocolitica NOT DETECTED NOT DETECTED Final   Vibrio species NOT DETECTED NOT DETECTED Final   Vibrio cholerae NOT DETECTED NOT DETECTED Final   Enteroaggregative E coli (EAEC) NOT DETECTED NOT DETECTED Final   Enteropathogenic E coli (EPEC) NOT DETECTED NOT DETECTED Final   Enterotoxigenic E coli (ETEC) NOT DETECTED NOT DETECTED Final   Shiga like toxin producing E coli (STEC) NOT DETECTED NOT DETECTED Final   Shigella/Enteroinvasive E coli (EIEC) NOT DETECTED NOT DETECTED Final   Cryptosporidium NOT DETECTED NOT DETECTED Final   Cyclospora cayetanensis NOT DETECTED NOT DETECTED Final   Entamoeba histolytica NOT DETECTED NOT DETECTED Final   Giardia lamblia NOT DETECTED NOT DETECTED Final   Adenovirus F40/41 NOT DETECTED NOT DETECTED Final   Astrovirus NOT DETECTED NOT DETECTED Final    Norovirus GI/GII NOT DETECTED NOT DETECTED Final   Rotavirus A NOT DETECTED NOT DETECTED Final   Sapovirus (I, II, IV, and V) NOT DETECTED NOT DETECTED Final   Studies/Results: No results found.  Medications:  Prior to Admission:  Prescriptions Prior to Admission  Medication Sig Dispense Refill Last Dose  . amLODipine (NORVASC) 10 MG tablet TAKE ONE TABLET BY MOUTH ONCE DAILY 90 tablet 1 01/29/2017 at Unknown time  . amoxicillin (AMOXIL) 500 MG capsule Take 2,000 mg by mouth as directed.    Taking  . atorvastatin (LIPITOR) 20 MG tablet TAKE ONE TABLET BY MOUTH ONCE DAILY 90 tablet 3 01/29/2017 at Unknown time  . carvedilol (COREG) 12.5 MG tablet TAKE ONE TABLET BY MOUTH TWICE DAILY 180 tablet 3 01/29/2017 at 1900  . Coenzyme Q10 (CO Q 10) 100 MG CAPS Take 1 capsule by mouth every morning.   01/29/2017 at Unknown time  . dorzolamide-timolol (COSOPT) 22.3-6.8 MG/ML ophthalmic  solution Place 1 drop into both eyes 2 (two) times daily.   01/29/2017 at Unknown time  . glimepiride (AMARYL) 2 MG tablet Take 2 mg by mouth daily before breakfast.    01/29/2017 at Unknown time  . magnesium oxide (MAG-OX) 400 MG tablet Take 400 mg by mouth daily.   01/29/2017 at Unknown time  . Multiple Vitamins-Minerals (ICAPS PO) Take 2 tablets by mouth 3 (three) times daily after meals.    01/29/2017 at Unknown time  . pantoprazole (PROTONIX) 40 MG tablet Take 1 tablet (40 mg total) by mouth daily. 90 tablet 3 01/29/2017 at Unknown time  . potassium chloride SA (K-DUR,KLOR-CON) 20 MEQ tablet Take 20 mEq by mouth daily.    01/29/2017 at Unknown time  . spironolactone (ALDACTONE) 25 MG tablet TAKE ONE TABLET BY MOUTH ONCE DAILY 45 tablet 3 01/29/2017 at Unknown time  . telmisartan (MICARDIS) 40 MG tablet Take 40 mg by mouth daily.   01/29/2017 at Unknown time  . torsemide (DEMADEX) 100 MG tablet Take 50 mg by mouth daily.    01/29/2017 at Unknown time  . warfarin (COUMADIN) 5 MG tablet Take 1 tablet daily except 1/2 tablet on Mondays,  Wednesdays and Fridays (Patient taking differently: Take 1 tablet daily except 1/2 tablet on sundays, Tuesday, thursday and saturdays) 30 tablet 12 01/29/2017 at 1500   Scheduled: . atorvastatin  20 mg Oral Daily  . carvedilol  12.5 mg Oral BID  . dorzolamide-timolol  1 drop Both Eyes BID  . furosemide  40 mg Intravenous Q12H  . insulin aspart  0-15 Units Subcutaneous TID WC  . insulin aspart  0-5 Units Subcutaneous QHS  . levalbuterol  0.63 mg Nebulization TID  . multivitamin-lutein  1 capsule Oral TID PC  . pantoprazole  40 mg Oral Daily  . spironolactone  25 mg Oral Daily  . warfarin  2.5 mg Oral Once  . Warfarin - Pharmacist Dosing Inpatient   Does not apply Q24H   Continuous:  AYT:[id number]  Assesment: He was admitted with multiple problems including diarrhea and dehydration. He has heme positive stool and his hemoglobin level has dropped. He is chronically anticoagulated because of atrial fib. He has coronary disease at baseline which is stable. He has had aortic valve replacement. He has COPD at baseline and that's doing better now. He has heart failure which is doing better. Active Problems:   HYPERTENSION, BENIGN   S/P AVR   CORONARY ARTERY BYPASS GRAFT, HX OF   Hyponatremia   COPD (chronic obstructive pulmonary disease) (HCC)   Bilateral leg edema   Diarrhea   Chronic diastolic heart failure (HCC)   Dehydration   Acute kidney injury ([place])    Plan: Hold Coumadin. Probable colonoscopy tomorrow. Discussed with patient and family and Dr. Laural Golden    LOS: 4 days   Bristyn Kulesza L 02/03/2017, 10:01 AM

## 2017-02-03 NOTE — Progress Notes (Signed)
ANTICOAGULATION CONSULT NOTE -   Pharmacy Consult for Coumadin Indication: atrial fibrillation  Allergies  Allergen Reactions  . Aspirin     Unknown, per pt  . Demerol     Unknown, per pt   Patient Measurements: Height: 5\' 9"  (175.3 cm) Weight: 253 lb 1.4 oz (114.8 kg) IBW/kg (Calculated) : 70.7  Vital Signs: Temp: 98.2 F (36.8 C) (08/12 1441) Temp Source: Oral (08/12 1441) BP: 103/52 (08/12 1441) Pulse Rate: 59 (08/12 1441)  Labs:  Recent Labs  02/01/17 0544 02/01/17 0922 02/02/17 0600 02/02/17 1321 02/03/17 0629  HGB 9.8*  --  9.4* 9.0* 9.5*  HCT 29.2*  --  27.9* 27.1* 27.7*  PLT  --   --  164  --  155  LABPROT  --  26.8* 24.4*  --  25.2*  INR  --  2.43 2.15  --  2.24  CREATININE 1.16  --  1.44*  --  1.34*   Estimated Creatinine Clearance: 52.2 mL/min (A) (by C-G formula based on SCr of 1.34 mg/dL (H)).  Medical History: Past Medical History:  Diagnosis Date  . Aortic aneurysm (HCC)    Ascending thoracic - 4.8 cm by CT 2017  . Aortic regurgitation    Bioprosthetic AVR 2006  . Atrial fibrillation with controlled ventricular response ([place]) 08/22/2015   CHADS2VASC=5, on Coumadin  . CAD (coronary artery disease)    Multivessel status post CABG 2006  . Esophageal reflux   . Essential hypertension   . History of dizziness   . Mixed hyperlipidemia   . Type 2 diabetes mellitus (HCC)    Medications:  Prescriptions Prior to Admission  Medication Sig Dispense Refill Last Dose  . amLODipine (NORVASC) 10 MG tablet TAKE ONE TABLET BY MOUTH ONCE DAILY 90 tablet 1 01/29/2017 at Unknown time  . amoxicillin (AMOXIL) 500 MG capsule Take 2,000 mg by mouth as directed.    Taking  . atorvastatin (LIPITOR) 20 MG tablet TAKE ONE TABLET BY MOUTH ONCE DAILY 90 tablet 3 01/29/2017 at Unknown time  . carvedilol (COREG) 12.5 MG tablet TAKE ONE TABLET BY MOUTH TWICE DAILY 180 tablet 3 01/29/2017 at 1900  . Coenzyme Q10 (CO Q 10) 100 MG CAPS Take 1 capsule by mouth every morning.    01/29/2017 at Unknown time  . dorzolamide-timolol (COSOPT) 22.3-6.8 MG/ML ophthalmic solution Place 1 drop into both eyes 2 (two) times daily.   01/29/2017 at Unknown time  . glimepiride (AMARYL) 2 MG tablet Take 2 mg by mouth daily before breakfast.    01/29/2017 at Unknown time  . magnesium oxide (MAG-OX) 400 MG tablet Take 400 mg by mouth daily.   01/29/2017 at Unknown time  . Multiple Vitamins-Minerals (ICAPS PO) Take 2 tablets by mouth 3 (three) times daily after meals.    01/29/2017 at Unknown time  . pantoprazole (PROTONIX) 40 MG tablet Take 1 tablet (40 mg total) by mouth daily. 90 tablet 3 01/29/2017 at Unknown time  . potassium chloride SA (K-DUR,KLOR-CON) 20 MEQ tablet Take 20 mEq by mouth daily.    01/29/2017 at Unknown time  . spironolactone (ALDACTONE) 25 MG tablet TAKE ONE TABLET BY MOUTH ONCE DAILY 45 tablet 3 01/29/2017 at Unknown time  . telmisartan (MICARDIS) 40 MG tablet Take 40 mg by mouth daily.   01/29/2017 at Unknown time  . torsemide (DEMADEX) 100 MG tablet Take 50 mg by mouth daily.    01/29/2017 at Unknown time  . warfarin (COUMADIN) 5 MG tablet Take 1 tablet daily except 1/2  tablet on Mondays, Wednesdays and Fridays (Patient taking differently: Take 1 tablet daily except 1/2 tablet on sundays, Tuesday, thursday and saturdays) 30 tablet 12 01/29/2017 at 1500   Assessment: 83yo male on chronic Coumadin PTA.  Home dose listed above.  INR therapeutic on admission (will provide VTE prophylaxis therefore Lovenox d/c'd).  D/W Megan, pt does not want colonoscopy, MD wants to resume Coumadin.  Goal of Therapy:  INR 2-3 Monitor platelets by anticoagulation protocol: Yes   Plan:  Coumadin 2.5mg  today x 1 INR daily  Hart Robinsons A 02/03/2017,4:07 PM

## 2017-02-04 DIAGNOSIS — K644 Residual hemorrhoidal skin tags: Secondary | ICD-10-CM

## 2017-02-04 DIAGNOSIS — K621 Rectal polyp: Secondary | ICD-10-CM

## 2017-02-04 DIAGNOSIS — C187 Malignant neoplasm of sigmoid colon: Secondary | ICD-10-CM

## 2017-02-04 LAB — BASIC METABOLIC PANEL
Anion gap: 8 (ref 5–15)
BUN: 34 mg/dL — ABNORMAL HIGH (ref 6–20)
CALCIUM: 8 mg/dL — AB (ref 8.9–10.3)
CO2: 28 mmol/L (ref 22–32)
Chloride: 97 mmol/L — ABNORMAL LOW (ref 101–111)
Creatinine, Ser: 1.42 mg/dL — ABNORMAL HIGH (ref 0.61–1.24)
GFR calc non Af Amer: 44 mL/min — ABNORMAL LOW (ref >60)
GFR, EST AFRICAN AMERICAN: 51 mL/min — AB (ref >60)
Glucose, Bld: 133 mg/dL — ABNORMAL HIGH (ref 65–99)
Potassium: 5 mmol/L (ref 3.5–5.1)
SODIUM: 133 mmol/L — AB (ref 135–145)

## 2017-02-04 LAB — CBC WITH DIFFERENTIAL/PLATELET
BASOS PCT: 0 %
Basophils Absolute: 0 10*3/uL (ref 0.0–0.1)
Basophils Absolute: 0 10*3/uL (ref 0.0–0.1)
Basophils Relative: 0 %
EOS ABS: 0.3 10*3/uL (ref 0.0–0.7)
EOS ABS: 0.3 10*3/uL (ref 0.0–0.7)
EOS PCT: 4 %
Eosinophils Relative: 5 %
HCT: 29.5 % — ABNORMAL LOW (ref 39.0–52.0)
HEMATOCRIT: 28.5 % — AB (ref 39.0–52.0)
Hemoglobin: 9.6 g/dL — ABNORMAL LOW (ref 13.0–17.0)
Hemoglobin: 10 g/dL — ABNORMAL LOW (ref 13.0–17.0)
LYMPHS ABS: 1.1 10*3/uL (ref 0.7–4.0)
LYMPHS PCT: 15 %
Lymphocytes Relative: 18 %
Lymphs Abs: 1.2 10*3/uL (ref 0.7–4.0)
MCH: 32.4 pg (ref 26.0–34.0)
MCH: 32.4 pg (ref 26.0–34.0)
MCHC: 33.7 g/dL (ref 30.0–36.0)
MCHC: 33.9 g/dL (ref 30.0–36.0)
MCV: 96.3 fL (ref 78.0–100.0)
MCV: 95.5 fL (ref 78.0–100.0)
MONO ABS: 0.8 10*3/uL (ref 0.1–1.0)
MONO ABS: 0.9 10*3/uL (ref 0.1–1.0)
MONOS PCT: 12 %
MONOS PCT: 13 %
Neutro Abs: 4.3 10*3/uL (ref 1.7–7.7)
Neutro Abs: 5.1 10*3/uL (ref 1.7–7.7)
Neutrophils Relative %: 65 %
Neutrophils Relative %: 68 %
PLATELETS: 183 10*3/uL (ref 150–400)
Platelets: 163 10*3/uL (ref 150–400)
RBC: 2.96 MIL/uL — ABNORMAL LOW (ref 4.22–5.81)
RBC: 3.09 MIL/uL — AB (ref 4.22–5.81)
RDW: 13.8 % (ref 11.5–15.5)
RDW: 13.8 % (ref 11.5–15.5)
WBC: 6.6 10*3/uL (ref 4.0–10.5)
WBC: 7.4 10*3/uL (ref 4.0–10.5)

## 2017-02-04 LAB — PROTIME-INR
INR: 2.08
Prothrombin Time: 23.7 seconds — ABNORMAL HIGH (ref 11.4–15.2)

## 2017-02-04 LAB — URINALYSIS, DIPSTICK ONLY
Bilirubin Urine: NEGATIVE
GLUCOSE, UA: NEGATIVE mg/dL
Hgb urine dipstick: NEGATIVE
KETONES UR: NEGATIVE mg/dL
LEUKOCYTES UA: NEGATIVE
Nitrite: NEGATIVE
PH: 7 (ref 5.0–8.0)
Protein, ur: NEGATIVE mg/dL
Specific Gravity, Urine: 1.01 (ref 1.005–1.030)

## 2017-02-04 LAB — GLUCOSE, CAPILLARY
GLUCOSE-CAPILLARY: 139 mg/dL — AB (ref 65–99)
Glucose-Capillary: 121 mg/dL — ABNORMAL HIGH (ref 65–99)
Glucose-Capillary: 160 mg/dL — ABNORMAL HIGH (ref 65–99)
Glucose-Capillary: 142 mg/dL — ABNORMAL HIGH (ref 65–99)

## 2017-02-04 MED ORDER — VITAMIN K1 10 MG/ML IJ SOLN
3.0000 mg | Freq: Once | INTRAVENOUS | Status: AC
  Administered 2017-02-04: 3 mg via INTRAVENOUS
  Filled 2017-02-04: qty 0.3

## 2017-02-04 MED ORDER — PEG 3350-KCL-NA BICARB-NACL 420 G PO SOLR
4000.0000 mL | Freq: Once | ORAL | Status: AC
  Administered 2017-02-04: 4000 mL via ORAL
  Filled 2017-02-04: qty 4000

## 2017-02-04 MED ORDER — WARFARIN SODIUM 2.5 MG PO TABS: 2.5000 mg | ORAL_TABLET | Freq: Once | ORAL | Status: DC

## 2017-02-04 MED ORDER — SODIUM CHLORIDE 0.9 % IV BOLUS (SEPSIS)
500.0000 mL | Freq: Once | INTRAVENOUS | Status: AC
  Administered 2017-02-04: 500 mL via INTRAVENOUS

## 2017-02-04 NOTE — Progress Notes (Signed)
Rechecked patient's BP after full 500 cc NS bolus was complete. BP was 118/60, and HR was 61. Will continue to monitor patient.

## 2017-02-04 NOTE — Progress Notes (Signed)
Patient's HGB was 10.0. Informed Dr. Luan Pulling and pt's pressure is 100/ 57.  Dr. Luan Pulling gave me a verbal order for a 200 ml bolus of normal saline over 2 hours. Will continue to monitor patient.

## 2017-02-04 NOTE — Progress Notes (Signed)
Patient went to the bathroom and a medium amount of bright red blood fell on the bathroom floor. There is also a small amount on the pad. Looked at rectal area and did not see where  Bleeding was coming from.  There is a small dime sized opening on the left buttock, but it did not appear to be the source of the bleeding.

## 2017-02-04 NOTE — Progress Notes (Signed)
Patient's BP was low at 99/62, HR 69, 97% on room air, and temperature low at 97.5 (due to patient drinking gallon jug of GoLYTELY). Informed Dr. Luan Pulling, who ordered a CBC verbally. Will continue to monitor patient.

## 2017-02-04 NOTE — Progress Notes (Addendum)
Small amount of bright red blood noted in urine. Pt denies painful urination.

## 2017-02-04 NOTE — Telephone Encounter (Signed)
Called home number but no answer.  LM for wife to call me back if she still has issues or concerns.  PT is still in [facility] for diarrhea, dehydration and AKI.  He had guiac + stool but has been restarted on coumadin.  Will follow up after discharge.

## 2017-02-04 NOTE — Progress Notes (Signed)
Subjective: He says he feels better. He did have blood in his urine and that will be checked. He's not going to undergo colonoscopy now. He does look better but says he's nowhere near his baseline.  Objective: Vital signs in last 24 hours: Temp:  [97.6 F (36.4 C)-98.4 F (36.9 C)] 98.4 F (36.9 C) (08/13 0422) Pulse Rate:  [57-63] 63 (08/13 0844) Resp:  [18] 18 (08/13 0422) BP: (103-120)/(52-74) 120/74 (08/13 0844) SpO2:  [90 %-99 %] 94 % (08/13 0803) Weight:  [114.6 kg (252 lb 9.6 oz)] 114.6 kg (252 lb 9.6 oz) (08/13 0544) Weight change: -0.221 kg (-7.8 oz) Last BM Date: 02/03/17  Intake/Output from previous day: 08/12 0701 - 08/13 0700 In: -  Out: 2575 [Urine:2575]  PHYSICAL EXAM General appearance: alert, cooperative and no distress Resp: clear to auscultation bilaterally Cardio: irregularly irregular rhythm GI: soft, non-tender; bowel sounds normal; no masses,  no organomegaly ExtremitieHe still has edema of both legs but it is bettersome skin lesions Skin warm and dry  Lab Results:  Results for orders placed or performed during the hospital encounter of 01/30/17 (from the past 48 hour(s))  Glucose, capillary     Status: Abnormal   Collection Time: 02/02/17 11:14 AM  Result Value Ref Range   Glucose-Capillary 164 (H) 65 - 99 mg/dL   Comment 1 Notify RN    Comment 2 Document in Chart   Occult blood card to lab, stool RN will collect     Status: Abnormal   Collection Time: 02/02/17 12:05 PM  Result Value Ref Range   Fecal Occult Bld POSITIVE (A) NEGATIVE  Hemoglobin and hematocrit, blood     Status: Abnormal   Collection Time: 02/02/17  1:21 PM  Result Value Ref Range   Hemoglobin 9.0 (L) 13.0 - 17.0 g/dL   HCT 27.1 (L) 39.0 - 52.0 %  Glucose, capillary     Status: Abnormal   Collection Time: 02/02/17  4:40 PM  Result Value Ref Range   Glucose-Capillary 134 (H) 65 - 99 mg/dL   Comment 1 Notify RN    Comment 2 Document in Chart   Glucose, capillary     Status:  Abnormal   Collection Time: 02/02/17  9:26 PM  Result Value Ref Range   Glucose-Capillary 124 (H) 65 - 99 mg/dL   Comment 1 Notify RN    Comment 2 Document in Chart   CBC with Differential/Platelet     Status: Abnormal   Collection Time: 02/03/17  6:29 AM  Result Value Ref Range   WBC 6.0 4.0 - 10.5 K/uL   RBC 2.90 (L) 4.22 - 5.81 MIL/uL   Hemoglobin 9.5 (L) 13.0 - 17.0 g/dL   HCT 27.7 (L) 39.0 - 52.0 %   MCV 95.5 78.0 - 100.0 fL   MCH 32.8 26.0 - 34.0 pg   MCHC 34.3 30.0 - 36.0 g/dL   RDW 13.7 11.5 - 15.5 %   Platelets 155 150 - 400 K/uL   Neutrophils Relative % 60 %   Neutro Abs 3.6 1.7 - 7.7 K/uL   Lymphocytes Relative 21 %   Lymphs Abs 1.3 0.7 - 4.0 K/uL   Monocytes Relative 15 %   Monocytes Absolute 0.9 0.1 - 1.0 K/uL   Eosinophils Relative 4 %   Eosinophils Absolute 0.2 0.0 - 0.7 K/uL   Basophils Relative 0 %   Basophils Absolute 0.0 0.0 - 0.1 K/uL  Basic metabolic panel     Status: Abnormal   Collection Time:  02/03/17  6:29 AM  Result Value Ref Range   Sodium 130 (L) 135 - 145 mmol/L   Potassium 4.3 3.5 - 5.1 mmol/L   Chloride 98 (L) 101 - 111 mmol/L   CO2 25 22 - 32 mmol/L   Glucose, Bld 95 65 - 99 mg/dL   BUN 32 (H) 6 - 20 mg/dL   Creatinine, Ser 1.34 (H) 0.61 - 1.24 mg/dL   Calcium 7.9 (L) 8.9 - 10.3 mg/dL   GFR calc non Af Amer 47 (L) >60 mL/min   GFR calc Af Amer 55 (L) >60 mL/min    Comment: (NOTE) The eGFR has been calculated using the CKD EPI equation. This calculation has not been validated in all clinical situations. eGFR's persistently <60 mL/min signify possible Chronic Kidney Disease.    Anion gap 7 5 - 15  Protime-INR     Status: Abnormal   Collection Time: 02/03/17  6:29 AM  Result Value Ref Range   Prothrombin Time 25.2 (H) 11.4 - 15.2 seconds   INR 2.24   Glucose, capillary     Status: None   Collection Time: 02/03/17  7:25 AM  Result Value Ref Range   Glucose-Capillary 92 65 - 99 mg/dL   Comment 1 Notify RN    Comment 2 Document in  Chart   Glucose, capillary     Status: Abnormal   Collection Time: 02/03/17 11:29 AM  Result Value Ref Range   Glucose-Capillary 159 (H) 65 - 99 mg/dL   Comment 1 Notify RN    Comment 2 Document in Chart   Glucose, capillary     Status: Abnormal   Collection Time: 02/03/17  4:17 PM  Result Value Ref Range   Glucose-Capillary 144 (H) 65 - 99 mg/dL   Comment 1 Notify RN    Comment 2 Document in Chart   Glucose, capillary     Status: Abnormal   Collection Time: 02/03/17  8:43 PM  Result Value Ref Range   Glucose-Capillary 165 (H) 65 - 99 mg/dL  CBC with Differential/Platelet     Status: Abnormal   Collection Time: 02/04/17  6:38 AM  Result Value Ref Range   WBC 6.6 4.0 - 10.5 K/uL   RBC 2.96 (L) 4.22 - 5.81 MIL/uL   Hemoglobin 9.6 (L) 13.0 - 17.0 g/dL   HCT 28.5 (L) 39.0 - 52.0 %   MCV 96.3 78.0 - 100.0 fL   MCH 32.4 26.0 - 34.0 pg   MCHC 33.7 30.0 - 36.0 g/dL   RDW 13.8 11.5 - 15.5 %   Platelets 163 150 - 400 K/uL   Neutrophils Relative % 65 %   Neutro Abs 4.3 1.7 - 7.7 K/uL   Lymphocytes Relative 18 %   Lymphs Abs 1.2 0.7 - 4.0 K/uL   Monocytes Relative 12 %   Monocytes Absolute 0.8 0.1 - 1.0 K/uL   Eosinophils Relative 5 %   Eosinophils Absolute 0.3 0.0 - 0.7 K/uL   Basophils Relative 0 %   Basophils Absolute 0.0 0.0 - 0.1 K/uL  Basic metabolic panel     Status: Abnormal   Collection Time: 02/04/17  6:38 AM  Result Value Ref Range   Sodium 133 (L) 135 - 145 mmol/L   Potassium 5.0 3.5 - 5.1 mmol/L   Chloride 97 (L) 101 - 111 mmol/L   CO2 28 22 - 32 mmol/L   Glucose, Bld 133 (H) 65 - 99 mg/dL   BUN 34 (H) 6 - 20 mg/dL  Creatinine, Ser 1.42 (H) 0.61 - 1.24 mg/dL   Calcium 8.0 (L) 8.9 - 10.3 mg/dL   GFR calc non Af Amer 44 (L) >60 mL/min   GFR calc Af Amer 51 (L) >60 mL/min    Comment: (NOTE) The eGFR has been calculated using the CKD EPI equation. This calculation has not been validated in all clinical situations. eGFR's persistently <60 mL/min signify possible  Chronic Kidney Disease.    Anion gap 8 5 - 15  Protime-INR     Status: Abnormal   Collection Time: 02/04/17  6:38 AM  Result Value Ref Range   Prothrombin Time 23.7 (H) 11.4 - 15.2 seconds   INR 2.08   Glucose, capillary     Status: Abnormal   Collection Time: 02/04/17  7:28 AM  Result Value Ref Range   Glucose-Capillary 121 (H) 65 - 99 mg/dL    ABGS No results for input(s): PHART, PO2ART, TCO2, HCO3 in the last 72 hours.  Invalid input(s): PCO2 CULTURES Recent Results (from the past 240 hour(s))  C difficile quick scan w PCR reflex     Status: None   Collection Time: 01/30/17  6:30 PM  Result Value Ref Range Status   C Diff antigen NEGATIVE NEGATIVE Final   C Diff toxin NEGATIVE NEGATIVE Final   C Diff interpretation No C. difficile detected.  Final  Gastrointestinal Panel by PCR , Stool     Status: None   Collection Time: 01/31/17 12:44 PM  Result Value Ref Range Status   Campylobacter species NOT DETECTED NOT DETECTED Final   Plesimonas shigelloides NOT DETECTED NOT DETECTED Final   Salmonella species NOT DETECTED NOT DETECTED Final   Yersinia enterocolitica NOT DETECTED NOT DETECTED Final   Vibrio species NOT DETECTED NOT DETECTED Final   Vibrio cholerae NOT DETECTED NOT DETECTED Final   Enteroaggregative E coli (EAEC) NOT DETECTED NOT DETECTED Final   Enteropathogenic E coli (EPEC) NOT DETECTED NOT DETECTED Final   Enterotoxigenic E coli (ETEC) NOT DETECTED NOT DETECTED Final   Shiga like toxin producing E coli (STEC) NOT DETECTED NOT DETECTED Final   Shigella/Enteroinvasive E coli (EIEC) NOT DETECTED NOT DETECTED Final   Cryptosporidium NOT DETECTED NOT DETECTED Final   Cyclospora cayetanensis NOT DETECTED NOT DETECTED Final   Entamoeba histolytica NOT DETECTED NOT DETECTED Final   Giardia lamblia NOT DETECTED NOT DETECTED Final   Adenovirus F40/41 NOT DETECTED NOT DETECTED Final   Astrovirus NOT DETECTED NOT DETECTED Final   Norovirus GI/GII NOT DETECTED NOT  DETECTED Final   Rotavirus A NOT DETECTED NOT DETECTED Final   Sapovirus (I, II, IV, and V) NOT DETECTED NOT DETECTED Final   Studies/Results: No results found.  Medications:  Prior to Admission:  Prescriptions Prior to Admission  Medication Sig Dispense Refill Last Dose  . amLODipine (NORVASC) 10 MG tablet TAKE ONE TABLET BY MOUTH ONCE DAILY 90 tablet 1 01/29/2017 at Unknown time  . amoxicillin (AMOXIL) 500 MG capsule Take 2,000 mg by mouth as directed.    Taking  . atorvastatin (LIPITOR) 20 MG tablet TAKE ONE TABLET BY MOUTH ONCE DAILY 90 tablet 3 01/29/2017 at Unknown time  . carvedilol (COREG) 12.5 MG tablet TAKE ONE TABLET BY MOUTH TWICE DAILY 180 tablet 3 01/29/2017 at 1900  . Coenzyme Q10 (CO Q 10) 100 MG CAPS Take 1 capsule by mouth every morning.   01/29/2017 at Unknown time  . dorzolamide-timolol (COSOPT) 22.3-6.8 MG/ML ophthalmic solution Place 1 drop into both eyes 2 (two) times daily.  01/29/2017 at Unknown time  . glimepiride (AMARYL) 2 MG tablet Take 2 mg by mouth daily before breakfast.    01/29/2017 at Unknown time  . magnesium oxide (MAG-OX) 400 MG tablet Take 400 mg by mouth daily.   01/29/2017 at Unknown time  . Multiple Vitamins-Minerals (ICAPS PO) Take 2 tablets by mouth 3 (three) times daily after meals.    01/29/2017 at Unknown time  . pantoprazole (PROTONIX) 40 MG tablet Take 1 tablet (40 mg total) by mouth daily. 90 tablet 3 01/29/2017 at Unknown time  . potassium chloride SA (K-DUR,KLOR-CON) 20 MEQ tablet Take 20 mEq by mouth daily.    01/29/2017 at Unknown time  . spironolactone (ALDACTONE) 25 MG tablet TAKE ONE TABLET BY MOUTH ONCE DAILY 45 tablet 3 01/29/2017 at Unknown time  . telmisartan (MICARDIS) 40 MG tablet Take 40 mg by mouth daily.   01/29/2017 at Unknown time  . torsemide (DEMADEX) 100 MG tablet Take 50 mg by mouth daily.    01/29/2017 at Unknown time  . warfarin (COUMADIN) 5 MG tablet Take 1 tablet daily except 1/2 tablet on Mondays, Wednesdays and Fridays (Patient taking  differently: Take 1 tablet daily except 1/2 tablet on sundays, Tuesday, thursday and saturdays) 30 tablet 12 01/29/2017 at 1500   Scheduled: . atorvastatin  20 mg Oral Daily  . carvedilol  12.5 mg Oral BID  . dorzolamide-timolol  1 drop Both Eyes BID  . furosemide  40 mg Intravenous Q12H  . insulin aspart  0-15 Units Subcutaneous TID WC  . insulin aspart  0-5 Units Subcutaneous QHS  . levalbuterol  0.63 mg Nebulization TID  . multivitamin-lutein  1 capsule Oral TID PC  . pantoprazole  40 mg Oral Daily  . spironolactone  25 mg Oral Daily  . Warfarin - Pharmacist Dosing Inpatient   Does not apply Q24H   Continuous:  KYH:[id number]  Assesment: He was admitted with dehydration and acute kidney injury chronic diarrhea and he's better with all of that. He has chronic diastolic heart failure and he is being diuresed. He has COPD as well. He now has blood in his urine. His renal function is not quite as good since he has restarted his diuresis. Active Problems:   HYPERTENSION, BENIGN   S/P AVR   CORONARY ARTERY BYPASS GRAFT, HX OF   Hyponatremia   COPD (chronic obstructive pulmonary disease) (HCC)   Bilateral leg edema   Diarrhea   Chronic diastolic heart failure (HCC)   Dehydration   Acute kidney injury ([place])    Plan: Continue treatments. Physical therapy evaluation. Continue other meds. Check urine and CNS    LOS: 5 days   Gerasimos Plotts L 02/04/2017, 8:52 AM

## 2017-02-04 NOTE — Progress Notes (Signed)
PT Cancellation Note  Patient Details Name: Terry Wu MRN: [id number] DOB: 1932/05/24   Cancelled Treatment:    Reason Eval/Treat Not Completed: Other (comment);Patient not medically ready. Pt received on toilet having a bowel movement. Pt refuses PT eval at this time. Reports frequent bowel movements as he is clearing the GI tract in prep for colonoscopy tomorrow. Will attempt again at later date/time.   5:20 PM, 02/04/17 Etta Grandchild, PT, DPT Physical Therapist - [place] [phone] (Office)    [name] 02/04/2017, 5:19 PM

## 2017-02-04 NOTE — Progress Notes (Signed)
  Subjective:  Patient's wife states that this morning he had bright red blood per rectum. Blood was dripping out as he was walking the bathroom. Was described to be moderate in amount. He denies abdominal pain.  Objective: Blood pressure 120/74, pulse 63, temperature 98.4 F (36.9 C), temperature source Oral, resp. rate 18, height 5\' 9"  (1.753 m), weight 252 lb 9.6 oz (114.6 kg), SpO2 94 %. Patient is alert and in no acute distress. Abdomen remains full but soft. Umbilicus hernia is unchanged. No tenderness organomegaly or masses. 1-2+ pitting edema involving both legs.  Labs/studies Results:   Recent Labs  02/02/17 0600 02/02/17 1321 02/03/17 0629 02/04/17 0638  WBC 8.1  --  6.0 6.6  HGB 9.4* 9.0* 9.5* 9.6*  HCT 27.9* 27.1* 27.7* 28.5*  PLT 164  --  155 163    BMET   Recent Labs  02/02/17 0600 02/03/17 0629 02/04/17 0638  NA 133* 130* 133*  K 4.8 4.3 5.0  CL 99* 98* 97*  CO2 27 25 28   GLUCOSE 116* 95 133*  BUN 33* 32* 34*  CREATININE 1.44* 1.34* 1.42*  CALCIUM 8.1* 7.9* 8.0*      Recent Labs  02/03/17 0629 02/04/17 0638  LABPROT 25.2* 23.7*  INR 2.24 2.08      Assessment:  #1. Acute diarrhea. Stool studies were negative. Diarrhea has resolved.  #2. Rectal bleeding. He was noted to have heme-positive stool on 2 different occasions during this hospitalization but he did not have frank bleeding until this morning. He will need further evaluation with colonoscopy. He and his wife agrees.  #3. Anemia. Hemoglobin has remained stable in the last 24 hours. It will be checked again tomorrow.  #4. Anticoagulation. INR is 2.08. Would like to see INR at least below 1.8 prior to colonoscopy.   Recommendations:  Hold warfarin. Vitamin K3 mg IV today. Likely prep this afternoon. CBC in a.m. Colonoscopy tomorrow morning.

## 2017-02-04 NOTE — Progress Notes (Signed)
Rechecked pt's BP while receiving 500 cc bolus.  Pt's BP was 117/72 and HR was 60. Will continue to monitor.

## 2017-02-04 NOTE — Progress Notes (Signed)
ANTICOAGULATION CONSULT NOTE -   Pharmacy Consult for Coumadin Indication: atrial fibrillation  Allergies  Allergen Reactions  . Aspirin     Unknown, per pt  . Demerol     Unknown, per pt   Patient Measurements: Height: 5\' 9"  (175.3 cm) Weight: 252 lb 9.6 oz (114.6 kg) IBW/kg (Calculated) : 70.7  Vital Signs: Temp: 98.4 F (36.9 C) (08/13 0422) Temp Source: Oral (08/13 0422) BP: 120/74 (08/13 0844) Pulse Rate: 63 (08/13 0844)  Labs:  Recent Labs  02/02/17 0600 02/02/17 1321 02/03/17 0629 02/04/17 0638  HGB 9.4* 9.0* 9.5* 9.6*  HCT 27.9* 27.1* 27.7* 28.5*  PLT 164  --  155 163  LABPROT 24.4*  --  25.2* 23.7*  INR 2.15  --  2.24 2.08  CREATININE 1.44*  --  1.34* 1.42*   Estimated Creatinine Clearance: 49.2 mL/min (A) (by C-G formula based on SCr of 1.42 mg/dL (H)).  Medical History: Past Medical History:  Diagnosis Date  . Aortic aneurysm (HCC)    Ascending thoracic - 4.8 cm by CT 2017  . Aortic regurgitation    Bioprosthetic AVR 2006  . Atrial fibrillation with controlled ventricular response ([place]) 08/22/2015   CHADS2VASC=5, on Coumadin  . CAD (coronary artery disease)    Multivessel status post CABG 2006  . Esophageal reflux   . Essential hypertension   . History of dizziness   . Mixed hyperlipidemia   . Type 2 diabetes mellitus (HCC)    Medications:  Prescriptions Prior to Admission  Medication Sig Dispense Refill Last Dose  . amLODipine (NORVASC) 10 MG tablet TAKE ONE TABLET BY MOUTH ONCE DAILY 90 tablet 1 01/29/2017 at Unknown time  . amoxicillin (AMOXIL) 500 MG capsule Take 2,000 mg by mouth as directed.    Taking  . atorvastatin (LIPITOR) 20 MG tablet TAKE ONE TABLET BY MOUTH ONCE DAILY 90 tablet 3 01/29/2017 at Unknown time  . carvedilol (COREG) 12.5 MG tablet TAKE ONE TABLET BY MOUTH TWICE DAILY 180 tablet 3 01/29/2017 at 1900  . Coenzyme Q10 (CO Q 10) 100 MG CAPS Take 1 capsule by mouth every morning.   01/29/2017 at Unknown time  .  dorzolamide-timolol (COSOPT) 22.3-6.8 MG/ML ophthalmic solution Place 1 drop into both eyes 2 (two) times daily.   01/29/2017 at Unknown time  . glimepiride (AMARYL) 2 MG tablet Take 2 mg by mouth daily before breakfast.    01/29/2017 at Unknown time  . magnesium oxide (MAG-OX) 400 MG tablet Take 400 mg by mouth daily.   01/29/2017 at Unknown time  . Multiple Vitamins-Minerals (ICAPS PO) Take 2 tablets by mouth 3 (three) times daily after meals.    01/29/2017 at Unknown time  . pantoprazole (PROTONIX) 40 MG tablet Take 1 tablet (40 mg total) by mouth daily. 90 tablet 3 01/29/2017 at Unknown time  . potassium chloride SA (K-DUR,KLOR-CON) 20 MEQ tablet Take 20 mEq by mouth daily.    01/29/2017 at Unknown time  . spironolactone (ALDACTONE) 25 MG tablet TAKE ONE TABLET BY MOUTH ONCE DAILY 45 tablet 3 01/29/2017 at Unknown time  . telmisartan (MICARDIS) 40 MG tablet Take 40 mg by mouth daily.   01/29/2017 at Unknown time  . torsemide (DEMADEX) 100 MG tablet Take 50 mg by mouth daily.    01/29/2017 at Unknown time  . warfarin (COUMADIN) 5 MG tablet Take 1 tablet daily except 1/2 tablet on Mondays, Wednesdays and Fridays (Patient taking differently: Take 1 tablet daily except 1/2 tablet on sundays, Tuesday, thursday and saturdays)  30 tablet 12 01/29/2017 at 1500   Assessment: 83yo male on chronic Coumadin PTA.  Home dose listed above.  INR therapeutic on admission (will provide VTE prophylaxis therefore Lovenox d/c'd).  D/W Megan, pt does not want colonoscopy, MD wants to resume Coumadin.  Goal of Therapy:  INR 2-3 Monitor platelets by anticoagulation protocol: Yes   Plan:  Coumadin 2.5mg  today x 1 INR daily  Hart Robinsons A 02/04/2017,9:25 AM

## 2017-02-05 ENCOUNTER — Encounter (HOSPITAL_COMMUNITY): Payer: Self-pay

## 2017-02-05 ENCOUNTER — Encounter (HOSPITAL_COMMUNITY)
Admission: AD | Disposition: A | Discharge status: Rehabilitation Facility | Payer: Self-pay | Source: Ambulatory Visit | Attending: Pulmonary Disease

## 2017-02-05 HISTORY — PX: COLONOSCOPY: SHX5424

## 2017-02-05 LAB — URINE CULTURE: CULTURE: NO GROWTH

## 2017-02-05 LAB — GLUCOSE, CAPILLARY
GLUCOSE-CAPILLARY: 96 mg/dL (ref 65–99)
Glucose-Capillary: 128 mg/dL — ABNORMAL HIGH (ref 65–99)
Glucose-Capillary: 149 mg/dL — ABNORMAL HIGH (ref 65–99)
Glucose-Capillary: 180 mg/dL — ABNORMAL HIGH (ref 65–99)

## 2017-02-05 LAB — PROTIME-INR
INR: 1.45
Prothrombin Time: 17.8 seconds — ABNORMAL HIGH (ref 11.4–15.2)

## 2017-02-05 LAB — CBC
HEMATOCRIT: 29.9 % — AB (ref 39.0–52.0)
HEMOGLOBIN: 10 g/dL — AB (ref 13.0–17.0)
MCH: 31.9 pg (ref 26.0–34.0)
MCHC: 33.4 g/dL (ref 30.0–36.0)
MCV: 95.5 fL (ref 78.0–100.0)
Platelets: 179 10*3/uL (ref 150–400)
RBC: 3.13 MIL/uL — AB (ref 4.22–5.81)
RDW: 13.8 % (ref 11.5–15.5)
WBC: 6.4 10*3/uL (ref 4.0–10.5)

## 2017-02-05 SURGERY — COLONOSCOPY
Anesthesia: Moderate Sedation

## 2017-02-05 MED ORDER — LEVALBUTEROL HCL 0.63 MG/3ML IN NEBU
0.6300 mg | INHALATION_SOLUTION | Freq: Two times a day (BID) | RESPIRATORY_TRACT | Status: DC
  Administered 2017-02-06 – 2017-02-07 (×3): 0.63 mg via RESPIRATORY_TRACT
  Filled 2017-02-05 (×3): qty 3

## 2017-02-05 MED ORDER — MIDAZOLAM HCL 5 MG/5ML IJ SOLN
INTRAMUSCULAR | Status: AC
  Filled 2017-02-05: qty 10

## 2017-02-05 MED ORDER — MIDAZOLAM HCL 5 MG/5ML IJ SOLN
INTRAMUSCULAR | Status: DC | PRN
  Administered 2017-02-05 (×2): 1 mg via INTRAVENOUS

## 2017-02-05 MED ORDER — FENTANYL CITRATE (PF) 100 MCG/2ML IJ SOLN
INTRAMUSCULAR | Status: DC | PRN
  Administered 2017-02-05: 25 ug via INTRAVENOUS

## 2017-02-05 MED ORDER — SODIUM CHLORIDE 0.9 % IV SOLN
INTRAVENOUS | Status: DC
  Administered 2017-02-05: 08:00:00 via INTRAVENOUS

## 2017-02-05 MED ORDER — FENTANYL CITRATE (PF) 100 MCG/2ML IJ SOLN
INTRAMUSCULAR | Status: AC
  Filled 2017-02-05: qty 2

## 2017-02-05 NOTE — Evaluation (Signed)
Physical Therapy Evaluation Patient Details Name: Terry Wu MRN: [id number] DOB: June 30, 1931 Today's Date: 02/05/2017   History of Present Illness  He came to my office with diarrhea for 2 weeks. He looks dehydrated. He has significant other issues including diabetes coronary disease chronic atrial fib bioprosthetic aortic valve aortic aneurysm coronary disease. He's been on liquid diet but it has not helped. He's having 12-15 bowel movements daily. He is short of breath when he ambulates now. He's not having a lot of swelling. No chest pain. No nausea. No vomiting. No fever.  Clinical Impression  Attempted evaluation earlier in afternoon but patient refused, stating he would like PT to try back later. Returned later in PM, patient willing to participate now and daughter present. Able to perform bed mobility generally with min guard and sit to stand with HHA x2; generally unsteady but able to ambulate with HHA x2 to restroom. Performed toileting with Min assist from daughter; both report this is his norm. Due to unsteadiness did ambulate with walker in room with much improved safety noted. Patient agreeable to receiving more PT moving forward however is resistant to HHPT [place] is agreeable to OP PT however. Patient left in bed with daughter in room, all needs met and bed alarm activated.     Follow Up Recommendations Home health PT;Outpatient PT (PT reccomends HHPT patient would rather do OP PT due to being in the middle of moving; will require OP PT minimum )    Equipment Recommendations  None recommended by PT    Recommendations for Other Services       Precautions / Restrictions Precautions Precautions: Fall Restrictions Weight Bearing Restrictions: No      Mobility  Bed Mobility Overal bed mobility: Needs Assistance Bed Mobility: Supine to Sit;Sit to Supine     Supine to sit: Min guard Sit to supine: Min guard       Transfers Overall transfer level: Needs assistance Equipment used: None;Standard walker Transfers: Sit to/from Stand Sit to Stand: Min guard         General transfer comment: initially with no device, HHA of 2; also with standard walker min guard and cues for safety  Ambulation/Gait Ambulation/Gait assistance: Supervision Ambulation Distance (Feet): 10 Feet Assistive device: Standard walker Gait Pattern/deviations: Decreased step length - right;Decreased step length - left;Decreased dorsiflexion - left;Decreased dorsiflexion - right     General Gait Details: ambulated within room, patient unwilling to ambulate in hallway today   Stairs            Wheelchair Mobility    Modified Rankin (Stroke Patients Only)       Balance Overall balance assessment: History of Falls;Needs assistance Sitting-balance support: No upper extremity supported Sitting balance-Leahy Scale: Good     Standing balance support: Bilateral upper extremity supported Standing balance-Leahy Scale: Fair                               Pertinent Vitals/Pain Pain Assessment: No/denies pain    Home Living Family/patient expects to be discharged to:: Private residence Living Arrangements: Spouse/significant other Available Help at Discharge: Family;Available PRN/intermittently;Available 24 hours/day Type of Home: House Home Access: Level entry     Home Layout: Multi-level Home Equipment: Walker - standard      Prior Function Level of Independence: Independent  Hand Dominance        Extremity/Trunk Assessment        Lower Extremity Assessment Lower Extremity Assessment: Generalized weakness    Cervical / Trunk Assessment Cervical / Trunk Assessment: Kyphotic  Communication   Communication: No difficulties  Cognition Arousal/Alertness: Awake/alert Behavior During Therapy: WFL for tasks assessed/performed Overall Cognitive Status: Within  Functional Limits for tasks assessed                                        General Comments General comments (skin integrity, edema, etc.): poor safety awareness, daughter has been assisting him to go to restroom     Exercises     Assessment/Plan    PT Assessment Patient needs continued PT services  PT Problem List Decreased strength;Decreased mobility;Decreased coordination;Decreased activity tolerance;Decreased balance;Decreased safety awareness       PT Treatment Interventions DME instruction;Therapeutic activities;Gait training;Therapeutic exercise;Patient/family education;Stair training;Balance training;Functional mobility training;Neuromuscular re-education    PT Goals (Current goals can be found in the Care Plan section)  Acute Rehab PT Goals Patient Stated Goal: to go home  PT Goal Formulation: With patient/family Time For Goal Achievement: 02/19/17 Potential to Achieve Goals: Good    Frequency Min 3X/week   Barriers to discharge        Co-evaluation               AM-PAC PT "6 Clicks" Daily Activity  Outcome Measure Difficulty turning over in bed (including adjusting bedclothes, sheets and blankets)?: A Little Difficulty moving from lying on back to sitting on the side of the bed? : A Little Difficulty sitting down on and standing up from a chair with arms (e.g., wheelchair, bedside commode, etc,.)?: A Little Help needed moving to and from a bed to chair (including a wheelchair)?: A Little Help needed walking in hospital room?: A Little Help needed climbing 3-5 steps with a railing? : A Lot 6 Click Score: 17    End of Session Equipment Utilized During Treatment: Gait belt Activity Tolerance: Patient tolerated treatment well;Patient limited by fatigue Patient left: in bed;with call bell/phone within reach;with family/visitor present   PT Visit Diagnosis: Unsteadiness on feet (R26.81);Other abnormalities of gait and mobility  (R26.89);Repeated falls (R29.6);Muscle weakness (generalized) (M62.81);History of falling (Z91.81)    Time: 1530-1540 PT Time Calculation (min) (ACUTE ONLY): 10 min   Charges:   PT Evaluation $PT Eval Low Complexity: 1 Low     PT G Codes:   PT G-Codes **NOT FOR INPATIENT CLASS** Functional Assessment Tool Used: AM-PAC 6 Clicks Basic Mobility;Clinical judgement Functional Limitation: Mobility: Walking and moving around Mobility: Walking and Moving Around Current Status (T2549): At least 20 percent but less than 40 percent impaired, limited or restricted Mobility: Walking and Moving Around Goal Status [phone]): At least 1 percent but less than 20 percent impaired, limited or restricted    Deniece Ree PT, DPT [phone]

## 2017-02-05 NOTE — Progress Notes (Signed)
Subjective: He says he feels okay. He had some hypotension yesterday which improved with normal saline bolus. He had a large volume bloody stool and now has agreed to undergo colonoscopy which will be done this morning. He's finished his prep and had a little bit of blood but not much. He is not having any hematuria now. His breathing is doing much better.  Objective: Vital signs in last 24 hours: Temp:  [97.5 F (36.4 C)-98.8 F (37.1 C)] 98.8 F (37.1 C) (08/14 0822) Pulse Rate:  [60-71] 62 (08/14 0454) Resp:  [16-22] 22 (08/14 0822) BP: (99-144)/(57-75) 139/75 (08/14 0822) SpO2:  [92 %-97 %] 93 % (08/14 0822) Weight:  [114 kg (251 lb 4.8 oz)] 114 kg (251 lb 4.8 oz) (08/14 0600) Weight change: -0.59 kg (-1 lb 4.8 oz) Last BM Date: 02/04/17  Intake/Output from previous day: 08/13 0701 - 08/14 0700 In: 550 [IV Piggyback:550] Out: 650 [Urine:650]  PHYSICAL EXAM General appearance: alert, cooperative and no distress Resp: clear to auscultation bilaterally Cardio: irregularly irregular rhythm GI: soft, non-tender; bowel sounds normal; no masses,  no organomegaly Extremities: His edema is improved He overall looks substantially better  Lab Results:  Results for orders placed or performed during the hospital encounter of 01/30/17 (from the past 48 hour(s))  Glucose, capillary     Status: Abnormal   Collection Time: 02/03/17 11:29 AM  Result Value Ref Range   Glucose-Capillary 159 (H) 65 - 99 mg/dL   Comment 1 Notify RN    Comment 2 Document in Chart   Glucose, capillary     Status: Abnormal   Collection Time: 02/03/17  4:17 PM  Result Value Ref Range   Glucose-Capillary 144 (H) 65 - 99 mg/dL   Comment 1 Notify RN    Comment 2 Document in Chart   Glucose, capillary     Status: Abnormal   Collection Time: 02/03/17  8:43 PM  Result Value Ref Range   Glucose-Capillary 165 (H) 65 - 99 mg/dL  CBC with Differential/Platelet     Status: Abnormal   Collection Time: 02/04/17  6:38  AM  Result Value Ref Range   WBC 6.6 4.0 - 10.5 K/uL   RBC 2.96 (L) 4.22 - 5.81 MIL/uL   Hemoglobin 9.6 (L) 13.0 - 17.0 g/dL   HCT 28.5 (L) 39.0 - 52.0 %   MCV 96.3 78.0 - 100.0 fL   MCH 32.4 26.0 - 34.0 pg   MCHC 33.7 30.0 - 36.0 g/dL   RDW 13.8 11.5 - 15.5 %   Platelets 163 150 - 400 K/uL   Neutrophils Relative % 65 %   Neutro Abs 4.3 1.7 - 7.7 K/uL   Lymphocytes Relative 18 %   Lymphs Abs 1.2 0.7 - 4.0 K/uL   Monocytes Relative 12 %   Monocytes Absolute 0.8 0.1 - 1.0 K/uL   Eosinophils Relative 5 %   Eosinophils Absolute 0.3 0.0 - 0.7 K/uL   Basophils Relative 0 %   Basophils Absolute 0.0 0.0 - 0.1 K/uL  Basic metabolic panel     Status: Abnormal   Collection Time: 02/04/17  6:38 AM  Result Value Ref Range   Sodium 133 (L) 135 - 145 mmol/L   Potassium 5.0 3.5 - 5.1 mmol/L   Chloride 97 (L) 101 - 111 mmol/L   CO2 28 22 - 32 mmol/L   Glucose, Bld 133 (H) 65 - 99 mg/dL   BUN 34 (H) 6 - 20 mg/dL   Creatinine, Ser 1.42 (H) 0.61 -  1.24 mg/dL   Calcium 8.0 (L) 8.9 - 10.3 mg/dL   GFR calc non Af Amer 44 (L) >60 mL/min   GFR calc Af Amer 51 (L) >60 mL/min    Comment: (NOTE) The eGFR has been calculated using the CKD EPI equation. This calculation has not been validated in all clinical situations. eGFR's persistently <60 mL/min signify possible Chronic Kidney Disease.    Anion gap 8 5 - 15  Protime-INR     Status: Abnormal   Collection Time: 02/04/17  6:38 AM  Result Value Ref Range   Prothrombin Time 23.7 (H) 11.4 - 15.2 seconds   INR 2.08   Glucose, capillary     Status: Abnormal   Collection Time: 02/04/17  7:28 AM  Result Value Ref Range   Glucose-Capillary 121 (H) 65 - 99 mg/dL  Urine Culture     Status: None   Collection Time: 02/04/17  8:23 AM  Result Value Ref Range   Specimen Description URINE, CLEAN CATCH    Special Requests NONE    Culture      NO GROWTH Performed at [facility], [place] [address], [place], [place] [address]    Report Status  02/05/2017 FINAL   Urinalysis, dipstick only     Status: None   Collection Time: 02/04/17  8:24 AM  Result Value Ref Range   Color, Urine YELLOW YELLOW   APPearance CLEAR CLEAR   Specific Gravity, Urine 1.010 1.005 - 1.030   pH 7.0 5.0 - 8.0   Glucose, UA NEGATIVE NEGATIVE mg/dL   Hgb urine dipstick NEGATIVE NEGATIVE   Bilirubin Urine NEGATIVE NEGATIVE   Ketones, ur NEGATIVE NEGATIVE mg/dL   Protein, ur NEGATIVE NEGATIVE mg/dL   Nitrite NEGATIVE NEGATIVE   Leukocytes, UA NEGATIVE NEGATIVE  Glucose, capillary     Status: Abnormal   Collection Time: 02/04/17 11:21 AM  Result Value Ref Range   Glucose-Capillary 160 (H) 65 - 99 mg/dL  CBC with Differential/Platelet     Status: Abnormal   Collection Time: 02/04/17  3:40 PM  Result Value Ref Range   WBC 7.4 4.0 - 10.5 K/uL   RBC 3.09 (L) 4.22 - 5.81 MIL/uL   Hemoglobin 10.0 (L) 13.0 - 17.0 g/dL   HCT 29.5 (L) 39.0 - 52.0 %   MCV 95.5 78.0 - 100.0 fL   MCH 32.4 26.0 - 34.0 pg   MCHC 33.9 30.0 - 36.0 g/dL   RDW 13.8 11.5 - 15.5 %   Platelets 183 150 - 400 K/uL   Neutrophils Relative % 68 %   Neutro Abs 5.1 1.7 - 7.7 K/uL   Lymphocytes Relative 15 %   Lymphs Abs 1.1 0.7 - 4.0 K/uL   Monocytes Relative 13 %   Monocytes Absolute 0.9 0.1 - 1.0 K/uL   Eosinophils Relative 4 %   Eosinophils Absolute 0.3 0.0 - 0.7 K/uL   Basophils Relative 0 %   Basophils Absolute 0.0 0.0 - 0.1 K/uL  Glucose, capillary     Status: Abnormal   Collection Time: 02/04/17  4:12 PM  Result Value Ref Range   Glucose-Capillary 139 (H) 65 - 99 mg/dL  Glucose, capillary     Status: Abnormal   Collection Time: 02/04/17  9:16 PM  Result Value Ref Range   Glucose-Capillary 142 (H) 65 - 99 mg/dL  Protime-INR     Status: Abnormal   Collection Time: 02/05/17  6:07 AM  Result Value Ref Range   Prothrombin Time 17.8 (H) 11.4 -  15.2 seconds   INR 1.45   CBC     Status: Abnormal   Collection Time: 02/05/17  6:07 AM  Result Value Ref Range   WBC 6.4 4.0 - 10.5  K/uL   RBC 3.13 (L) 4.22 - 5.81 MIL/uL   Hemoglobin 10.0 (L) 13.0 - 17.0 g/dL   HCT 29.9 (L) 39.0 - 52.0 %   MCV 95.5 78.0 - 100.0 fL   MCH 31.9 26.0 - 34.0 pg   MCHC 33.4 30.0 - 36.0 g/dL   RDW 13.8 11.5 - 15.5 %   Platelets 179 150 - 400 K/uL  Glucose, capillary     Status: Abnormal   Collection Time: 02/05/17  7:43 AM  Result Value Ref Range   Glucose-Capillary 128 (H) 65 - 99 mg/dL   Comment 1 Notify RN    Comment 2 Document in Chart     ABGS No results for input(s): PHART, PO2ART, TCO2, HCO3 in the last 72 hours.  Invalid input(s): PCO2 CULTURES Recent Results (from the past 240 hour(s))  C difficile quick scan w PCR reflex     Status: None   Collection Time: 01/30/17  6:30 PM  Result Value Ref Range Status   C Diff antigen NEGATIVE NEGATIVE Final   C Diff toxin NEGATIVE NEGATIVE Final   C Diff interpretation No C. difficile detected.  Final  Gastrointestinal Panel by PCR , Stool     Status: None   Collection Time: 01/31/17 12:44 PM  Result Value Ref Range Status   Campylobacter species NOT DETECTED NOT DETECTED Final   Plesimonas shigelloides NOT DETECTED NOT DETECTED Final   Salmonella species NOT DETECTED NOT DETECTED Final   Yersinia enterocolitica NOT DETECTED NOT DETECTED Final   Vibrio species NOT DETECTED NOT DETECTED Final   Vibrio cholerae NOT DETECTED NOT DETECTED Final   Enteroaggregative E coli (EAEC) NOT DETECTED NOT DETECTED Final   Enteropathogenic E coli (EPEC) NOT DETECTED NOT DETECTED Final   Enterotoxigenic E coli (ETEC) NOT DETECTED NOT DETECTED Final   Shiga like toxin producing E coli (STEC) NOT DETECTED NOT DETECTED Final   Shigella/Enteroinvasive E coli (EIEC) NOT DETECTED NOT DETECTED Final   Cryptosporidium NOT DETECTED NOT DETECTED Final   Cyclospora cayetanensis NOT DETECTED NOT DETECTED Final   Entamoeba histolytica NOT DETECTED NOT DETECTED Final   Giardia lamblia NOT DETECTED NOT DETECTED Final   Adenovirus F40/41 NOT DETECTED NOT  DETECTED Final   Astrovirus NOT DETECTED NOT DETECTED Final   Norovirus GI/GII NOT DETECTED NOT DETECTED Final   Rotavirus A NOT DETECTED NOT DETECTED Final   Sapovirus (I, II, IV, and V) NOT DETECTED NOT DETECTED Final  Urine Culture     Status: None   Collection Time: 02/04/17  8:23 AM  Result Value Ref Range Status   Specimen Description URINE, CLEAN CATCH  Final   Special Requests NONE  Final   Culture   Final    NO GROWTH Performed at [facility], [address], [place], [place] [address]    Report Status 02/05/2017 FINAL  Final   Studies/Results: No results found.  Medications:  Prior to Admission:  Prescriptions Prior to Admission  Medication Sig Dispense Refill Last Dose  . amLODipine (NORVASC) 10 MG tablet TAKE ONE TABLET BY MOUTH ONCE DAILY 90 tablet 1 01/29/2017 at Unknown time  . amoxicillin (AMOXIL) 500 MG capsule Take 2,000 mg by mouth as directed.    Taking  . atorvastatin (LIPITOR) 20 MG tablet TAKE ONE  TABLET BY MOUTH ONCE DAILY 90 tablet 3 01/29/2017 at Unknown time  . carvedilol (COREG) 12.5 MG tablet TAKE ONE TABLET BY MOUTH TWICE DAILY 180 tablet 3 01/29/2017 at 1900  . Coenzyme Q10 (CO Q 10) 100 MG CAPS Take 1 capsule by mouth every morning.   01/29/2017 at Unknown time  . dorzolamide-timolol (COSOPT) 22.3-6.8 MG/ML ophthalmic solution Place 1 drop into both eyes 2 (two) times daily.   01/29/2017 at Unknown time  . glimepiride (AMARYL) 2 MG tablet Take 2 mg by mouth daily before breakfast.    01/29/2017 at Unknown time  . magnesium oxide (MAG-OX) 400 MG tablet Take 400 mg by mouth daily.   01/29/2017 at Unknown time  . Multiple Vitamins-Minerals (ICAPS PO) Take 2 tablets by mouth 3 (three) times daily after meals.    01/29/2017 at Unknown time  . pantoprazole (PROTONIX) 40 MG tablet Take 1 tablet (40 mg total) by mouth daily. 90 tablet 3 01/29/2017 at Unknown time  . potassium chloride SA (K-DUR,KLOR-CON) 20 MEQ tablet Take 20 mEq by mouth daily.    01/29/2017 at Unknown  time  . spironolactone (ALDACTONE) 25 MG tablet TAKE ONE TABLET BY MOUTH ONCE DAILY 45 tablet 3 01/29/2017 at Unknown time  . telmisartan (MICARDIS) 40 MG tablet Take 40 mg by mouth daily.   01/29/2017 at Unknown time  . torsemide (DEMADEX) 100 MG tablet Take 50 mg by mouth daily.    01/29/2017 at Unknown time  . warfarin (COUMADIN) 5 MG tablet Take 1 tablet daily except 1/2 tablet on Mondays, Wednesdays and Fridays (Patient taking differently: Take 1 tablet daily except 1/2 tablet on sundays, Tuesday, thursday and saturdays) 30 tablet 12 01/29/2017 at 1500   Scheduled: . fentaNYL      . midazolam      . [MAR Hold] atorvastatin  20 mg Oral Daily  . [MAR Hold] carvedilol  12.5 mg Oral BID  . [MAR Hold] dorzolamide-timolol  1 drop Both Eyes BID  . [MAR Hold] furosemide  40 mg Intravenous Q12H  . [MAR Hold] insulin aspart  0-15 Units Subcutaneous TID WC  . [MAR Hold] insulin aspart  0-5 Units Subcutaneous QHS  . [MAR Hold] levalbuterol  0.63 mg Nebulization TID  . [MAR Hold] multivitamin-lutein  1 capsule Oral TID PC  . [MAR Hold] pantoprazole  40 mg Oral Daily  . [MAR Hold] spironolactone  25 mg Oral Daily   Continuous: . sodium chloride 20 mL/hr at 02/05/17 0829   PRN:[MAR Hold] levalbuterol  Assesment:He was admitted with diarrhea acute kidney injury dehydration. He has heme positive stool and he is on chronic anticoagulation for atrial fib. I think he had some acute diastolic heart failure from fluid resuscitation and that is better. His blood pressure was low yesterday I think from a combination of bleeding diuresis and prepped for colonoscopy. He's better this morning. Active Problems:   HYPERTENSION, BENIGN   S/P AVR   CORONARY ARTERY BYPASS GRAFT, HX OF   Hyponatremia   COPD (chronic obstructive pulmonary disease) (HCC)   Bilateral leg edema   Diarrhea   Chronic diastolic heart failure (HCC)   Dehydration   Acute kidney injury ([place])    Plan: Colonoscopy today. Continue other  treatments.    LOS: 6 days   Dora Simeone L 02/05/2017, 8:55 AM

## 2017-02-05 NOTE — Op Note (Signed)
[facility] Patient Name: Terry Wu Procedure Date: 02/05/2017 8:02 AM MRN: [id number] Date of Birth: 1931/07/12 Attending MD: Hildred Laser , MD CSN: [id number] Age: 83 Admit Type: Outpatient Procedure:                Colonoscopy Indications:              Rectal bleeding Providers:                Hildred Laser, MD, Hinton Rao, RN, Aram Candela Referring MD:             [place] Luan Pulling, MD Medicines:                Fentanyl 25 micrograms IV, Midazolam 2 mg IV Complications:            No immediate complications. Estimated Blood Loss:     Estimated blood loss was minimal. Procedure:                Pre-Anesthesia Assessment:                           - Prior to the procedure, a History and Physical                            was performed, and patient medications and                            allergies were reviewed. The patient's tolerance of                            previous anesthesia was also reviewed. The risks                            and benefits of the procedure and the sedation                            options and risks were discussed with the patient.                            All questions were answered, and informed consent                            was obtained. Prior Anticoagulants: The patient                            last took Coumadin (warfarin) 2 days prior to the                            procedure. ASA Grade Assessment: III - A patient                            with severe systemic disease. After reviewing the                            risks and benefits, the patient was deemed in  satisfactory condition to undergo the procedure.                           After obtaining informed consent, the colonoscope                            was passed under direct vision. Throughout the                            procedure, the patient's blood pressure, pulse, and                            oxygen saturations were monitored  continuously. The                            was introduced through the anus and advanced to the                            the hepatic flexure. The colonoscopy was                            technically difficult and complex due to poor                            endoscopic visualization, a tortuous colon and the                            patient's discomfort during the procedure. The                            patient tolerated the procedure well. The quality                            of the bowel preparation was adequate to identify                            polyps 6 mm and larger in size. The rectum was                            photographed. Scope In: 9:00:06 AM Scope Out: 9:28:32 AM Total Procedure Duration: 0 hours 28 minutes 26 seconds  Findings:      The perianal exam findings include large soft skin tags.      The digital rectal exam was normal.      The proximal sigmoid colon, mid sigmoid colon, descending colon, splenic       flexure, transverse colon and hepatic flexure appeared normal.      A fungating and ulcerated non-obstructing large mass was found in the       distal sigmoid colon. The mass was partially circumferential (involving       one-third of the lumen circumference). The mass measured four cm in       length. In addition, its diameter measured four mm. No bleeding was       present. Biopsies were taken with a cold forceps for histology. The  pathology specimen was placed into Bottle Number 1.      A 15 mm polyp was found in the rectum. The polyp was semi-pedunculated.       The polyp was removed with a hot snare. Resection and retrieval were       complete. The pathology specimen was placed into Bottle Number 2.      A 17 mm polyp was found in the rectum. The polyp was semi-pedunculated.       The polyp was removed with a hot snare. Resection and retrieval were       complete. The pathology specimen was placed into Bottle Number 3.      External  hemorrhoids were found during retroflexion. The hemorrhoids       were small. Impression:               - Perianal skin tags found on perianal exam.                           - The proximal sigmoid colon, mid sigmoid colon,                            descending colon, splenic flexure, transverse colon                            and hepatic flexure are normal.                           - Malignant tumor in the distal sigmoid colon.                            Biopsied.                           - One 15 mm polyp in the rectum, removed with a hot                            snare. Resected and retrieved.                           - One 17 mm polyp in the rectum, removed with a hot                            snare. Resected and retrieved.                           - External hemorrhoids. Moderate Sedation:      Moderate (conscious) sedation was administered by the endoscopy nurse       and supervised by the endoscopist. The following parameters were       monitored: oxygen saturation, heart rate, blood pressure, CO2       capnography and response to care. Total physician intraservice time was       31 minutes. Recommendation:           - Return patient to hospital ward for ongoing care.                           -  Diabetic (ADA) diet today.                           - Continue present medications.                           - Await pathology results.                           - hold warfarin for now.                           - No recommendation at this time regarding repeat                            colonoscopy. Procedure Code(s):        --- Professional ---                           [phone], 52, Colonoscopy, flexible; with removal of                            tumor(s), polyp(s), or other lesion(s) by snare                            technique                           45380, 59,52, Colonoscopy, flexible; with biopsy,                            single or multiple                            99152, Moderate sedation services provided by the                            same physician or other qualified health care                            professional performing the diagnostic or                            therapeutic service that the sedation supports,                            requiring the presence of an independent trained                            observer to assist in the monitoring of the                            patient's level of consciousness and physiological                            status; initial 15 minutes of intraservice time,  patient age 15 years or older                           [id number], Moderate sedation services; each additional                            15 minutes intraservice time Diagnosis Code(s):        --- Professional ---                           K64.4, Residual hemorrhoidal skin tags                           C18.7, Malignant neoplasm of sigmoid colon                           K62.1, Rectal polyp                           K62.5, Hemorrhage of anus and rectum CPT copyright 2016 American [employer]. All rights reserved. The codes documented in this report are preliminary and upon coder review may  be revised to meet current compliance requirements. Hildred Laser, MD Hildred Laser, MD 02/05/2017 9:55:17 AM This report has been signed electronically. Number of Addenda: 0

## 2017-02-05 NOTE — Care Management Note (Signed)
Case Management Note  Patient Details  Name: Terry Wu MRN: [id number] Date of Birth: 1932-05-28   If discussed at [place] Length of Stay Meetings, dates discussed:   02/05/2017 Additional Comments:  Emy Angevine, Chauncey Reading, RN 02/05/2017, 10:24 AM

## 2017-02-05 NOTE — Care Management Important Message (Signed)
Important Message  Patient Details  Name: Terry Wu MRN: [id number] Date of Birth: [birth year]   Medicare Important Message Given:  Yes    Waller Marcussen, Chauncey Reading, RN 02/05/2017, 10:01 AM

## 2017-02-05 NOTE — Progress Notes (Addendum)
Brief colonoscopy note  Marginal prep. Redundant colon. Examination performed the hepatic flexure. Soft multiple anal skin time. 4 x 4 cm polypoidal ulcerated friable mass just above the rectosigmoid junction. Multiple biopsies taken 15 mm polyp hot snared from proximal rectum. 17 mm polyp hot snare from distal rectum. Small external hemorrhoids.

## 2017-02-06 DIAGNOSIS — Z952 Presence of prosthetic heart valve: Secondary | ICD-10-CM

## 2017-02-06 DIAGNOSIS — Z951 Presence of aortocoronary bypass graft: Secondary | ICD-10-CM

## 2017-02-06 DIAGNOSIS — Z01810 Encounter for preprocedural cardiovascular examination: Secondary | ICD-10-CM

## 2017-02-06 DIAGNOSIS — I482 Chronic atrial fibrillation: Secondary | ICD-10-CM

## 2017-02-06 LAB — GLUCOSE, CAPILLARY
GLUCOSE-CAPILLARY: 188 mg/dL — AB (ref 65–99)
Glucose-Capillary: 200 mg/dL — ABNORMAL HIGH (ref 65–99)
Glucose-Capillary: 160 mg/dL — ABNORMAL HIGH (ref 65–99)
Glucose-Capillary: 134 mg/dL — ABNORMAL HIGH (ref 65–99)

## 2017-02-06 LAB — CBC WITH DIFFERENTIAL/PLATELET
Basophils Absolute: 0 10*3/uL (ref 0.0–0.1)
Basophils Relative: 0 %
Eosinophils Absolute: 0.2 10*3/uL (ref 0.0–0.7)
Eosinophils Relative: 1 %
HEMATOCRIT: 30.8 % — AB (ref 39.0–52.0)
Hemoglobin: 10.2 g/dL — ABNORMAL LOW (ref 13.0–17.0)
LYMPHS PCT: 9 %
Lymphs Abs: 1.3 10*3/uL (ref 0.7–4.0)
MCH: 32.5 pg (ref 26.0–34.0)
MCHC: 33.1 g/dL (ref 30.0–36.0)
MCV: 98.1 fL (ref 78.0–100.0)
MONO ABS: 1.4 10*3/uL — AB (ref 0.1–1.0)
MONOS PCT: 10 %
NEUTROS ABS: 11 10*3/uL — AB (ref 1.7–7.7)
Neutrophils Relative %: 80 %
Platelets: 191 10*3/uL (ref 150–400)
RBC: 3.14 MIL/uL — ABNORMAL LOW (ref 4.22–5.81)
RDW: 14 % (ref 11.5–15.5)
WBC: 13.8 10*3/uL — ABNORMAL HIGH (ref 4.0–10.5)

## 2017-02-06 LAB — BASIC METABOLIC PANEL
Anion gap: 7 (ref 5–15)
BUN: 15 mg/dL (ref 6–20)
CO2: 29 mmol/L (ref 22–32)
Calcium: 8.5 mg/dL — ABNORMAL LOW (ref 8.9–10.3)
Chloride: 98 mmol/L — ABNORMAL LOW (ref 101–111)
Creatinine, Ser: 1.03 mg/dL (ref 0.61–1.24)
GFR calc Af Amer: >60 mL/min (ref >60)
GFR calc non Af Amer: >60 mL/min (ref >60)
Glucose, Bld: 135 mg/dL — ABNORMAL HIGH (ref 65–99)
Potassium: 4.5 mmol/L (ref 3.5–5.1)
Sodium: 134 mmol/L — ABNORMAL LOW (ref 135–145)

## 2017-02-06 LAB — PROTIME-INR
INR: 1.37
PROTHROMBIN TIME: 17 s — AB (ref 11.4–15.2)

## 2017-02-06 LAB — CEA: CEA: 1.4 ng/mL (ref 0.0–4.7)

## 2017-02-06 MED ORDER — PROSIGHT PO TABS
1.0000 | ORAL_TABLET | Freq: Three times a day (TID) | ORAL | Status: DC
  Administered 2017-02-06 (×2): 1 via ORAL
  Filled 2017-02-06 (×3): qty 1

## 2017-02-06 MED ORDER — POLYETHYLENE GLYCOL 3350 17 G PO PACK
17.0000 g | PACK | Freq: Every day | ORAL | Status: DC
  Administered 2017-02-06: 17 g via ORAL
  Filled 2017-02-06: qty 1

## 2017-02-06 NOTE — Plan of Care (Signed)
  Discussed pts case w/ Dr. Velvet Bathe at [place]. Pt is to be transferred to [facility] for further workup and treatment of a fungating but nonobstructive colonic mass that is likely cancer. Colonoscopy performed the day prior to transfer w/ biopsy and tissue sent for path review. Pt w/ significant cardiac history including Afib on coumadin, AVR, CABG, diastolic heart failure requiring higher level of monitoring and care. [facility] aware of patient and will see the patient in consultation for possible resection. Patient accepted to [facility] service at [facility] on a telemetry bed.  Linna Darner, MD [facility] 02/06/2017, 10:48 AM

## 2017-02-06 NOTE — Progress Notes (Signed)
Report called to Tyler Aas, Therapist, sports at [facility].  [facility] in transit with patient to [facility].

## 2017-02-06 NOTE — Progress Notes (Signed)
  Subjective:  Patient has no GI complaints. He denies abdominal pain melena or rectal bleeding.  Objective: Blood pressure (!) 136/49, pulse 78, temperature 99.5 F (37.5 C), temperature source Oral, resp. rate 18, height 5\' 9"  (1.753 m), weight 248 lb 14.4 oz (112.9 kg), SpO2 98 %. Patient is sitting at the edge of the bed and appears to be comfortable.   Labs/studies Results:   Recent Labs  02/04/17 1540 02/05/17 0607 02/06/17 0627  WBC 7.4 6.4 13.8*  HGB 10.0* 10.0* 10.2*  HCT 29.5* 29.9* 30.8*  PLT 183 179 191    BMET   Recent Labs  02/04/17 0638 02/06/17 0627  NA 133* 134*  K 5.0 4.5  CL 97* 98*  CO2 28 29  GLUCOSE 133* 135*  BUN 34* 15  CREATININE 1.42* 1.03  CALCIUM 8.0* 8.5*      Recent Labs  02/05/17 0607 02/06/17 0627  LABPROT 17.8* 17.0*  INR 1.45 1.37    CEA is 1.4.  Biopsy results as follows.  Distal sigmoid colon masses invasive adenocarcinoma. Proximal rectal polyp is tubulovillous adenoma with high-grade dysplasia. Distal rectal polyp is tubulovillous adenoma with high-grade dysplasia.  CT images were reviewed once again. Both polyps can be seen in distal and proximal rectum. There is focal thickening to mucosa of distal sigmoid colon just above rectosigmoid junction may correspond to malignant lesion   Assessment:  #1. Acute diarrhea. This was reason for patient's admission. Stool studies were negative and diarrhea as clear.  #2. Distal sigmoid colon carcinoma. Patient will need low anterior resection. He also had 2 large rectal polyps with high-grade dysplasia but no invasive carcinoma. Patient will need low anterior resection. Patient is being transferred to [facility] cause he is high risk because of cardiopulmonary disease. Dr. Luan Pulling has arranged the transfer to hospitalist service. Discussed with Dr. Audry Riles. Who will be seen patient from surgical service.  #3. Anemia secondary GI blood loss. H&H is gradually coming  up.   Recommendations:  Agree with plans for transfer to [facility] for surgery.

## 2017-02-06 NOTE — Progress Notes (Signed)
Subjective: He says he feels okay. He underwent colonoscopy yesterday and was found to have a distal sigmoid mass which was fungating ulcerated but nonobstructing. The mass was partially circumferential measured 4 cm in length diameter 4 mm and this was biopsied. I discussed this with Dr.Rehman yesterday and his feeling is that this is very likely colon cancer and that it is probably resectable. Discussed with the patient and his son at bedside. Their preference would be that if he's going to have surgery that we go ahead and try to get that done now.  Objective: Vital signs in last 24 hours: Temp:  [98.3 F (36.8 C)-99.5 F (37.5 C)] 99.5 F (37.5 C) (08/15 0620) Pulse Rate:  [51-87] 87 (08/15 0620) Resp:  [16-26] 18 (08/15 0620) BP: (106-136)/(49-103) 136/49 (08/15 0620) SpO2:  [89 %-97 %] 95 % (08/15 0620) Weight:  [112.9 kg (248 lb 14.4 oz)] 112.9 kg (248 lb 14.4 oz) (08/15 0620) Weight change: -1.089 kg (-2 lb 6.4 oz) Last BM Date: 02/04/17  Intake/Output from previous day: 08/14 0701 - 08/15 0700 In: -  Out: 350 [Urine:350]  PHYSICAL EXAM General appearance: alert, cooperative, no distress and moderately obese Resp: wheezes bilaterally Cardio: irregularly irregular rhythm GI: soft, non-tender; bowel sounds normal; no masses,  no organomegaly Extremities: extremities normal, atraumatic, no cyanosis or edema Skin warm and dry  Lab Results:  Results for orders placed or performed during the hospital encounter of 01/30/17 (from the past 48 hour(s))  Glucose, capillary     Status: Abnormal   Collection Time: 02/04/17 11:21 AM  Result Value Ref Range   Glucose-Capillary 160 (H) 65 - 99 mg/dL  CBC with Differential/Platelet     Status: Abnormal   Collection Time: 02/04/17  3:40 PM  Result Value Ref Range   WBC 7.4 4.0 - 10.5 K/uL   RBC 3.09 (L) 4.22 - 5.81 MIL/uL   Hemoglobin 10.0 (L) 13.0 - 17.0 g/dL   HCT 29.5 (L) 39.0 - 52.0 %   MCV 95.5 78.0 - 100.0 fL   MCH 32.4 26.0  - 34.0 pg   MCHC 33.9 30.0 - 36.0 g/dL   RDW 13.8 11.5 - 15.5 %   Platelets 183 150 - 400 K/uL   Neutrophils Relative % 68 %   Neutro Abs 5.1 1.7 - 7.7 K/uL   Lymphocytes Relative 15 %   Lymphs Abs 1.1 0.7 - 4.0 K/uL   Monocytes Relative 13 %   Monocytes Absolute 0.9 0.1 - 1.0 K/uL   Eosinophils Relative 4 %   Eosinophils Absolute 0.3 0.0 - 0.7 K/uL   Basophils Relative 0 %   Basophils Absolute 0.0 0.0 - 0.1 K/uL  Glucose, capillary     Status: Abnormal   Collection Time: 02/04/17  4:12 PM  Result Value Ref Range   Glucose-Capillary 139 (H) 65 - 99 mg/dL  Glucose, capillary     Status: Abnormal   Collection Time: 02/04/17  9:16 PM  Result Value Ref Range   Glucose-Capillary 142 (H) 65 - 99 mg/dL  Protime-INR     Status: Abnormal   Collection Time: 02/05/17  6:07 AM  Result Value Ref Range   Prothrombin Time 17.8 (H) 11.4 - 15.2 seconds   INR 1.45   CBC     Status: Abnormal   Collection Time: 02/05/17  6:07 AM  Result Value Ref Range   WBC 6.4 4.0 - 10.5 K/uL   RBC 3.13 (L) 4.22 - 5.81 MIL/uL   Hemoglobin 10.0 (L) 13.0 -  17.0 g/dL   HCT 29.9 (L) 39.0 - 52.0 %   MCV 95.5 78.0 - 100.0 fL   MCH 31.9 26.0 - 34.0 pg   MCHC 33.4 30.0 - 36.0 g/dL   RDW 13.8 11.5 - 15.5 %   Platelets 179 150 - 400 K/uL  Glucose, capillary     Status: Abnormal   Collection Time: 02/05/17  7:43 AM  Result Value Ref Range   Glucose-Capillary 128 (H) 65 - 99 mg/dL   Comment 1 Notify RN    Comment 2 Document in Chart   CEA     Status: None   Collection Time: 02/05/17 10:24 AM  Result Value Ref Range   CEA 1.4 0.0 - 4.7 ng/mL    Comment: (NOTE)       Roche ECLIA methodology       Nonsmokers  <3.9                                     Smokers     <5.6 Performed At: [facility] [address] [place], [place] [id number] Lindon Romp MD HW:[id number]   Glucose, capillary     Status: None   Collection Time: 02/05/17 11:36 AM  Result Value Ref Range   Glucose-Capillary 96 65 - 99  mg/dL   Comment 1 Notify RN    Comment 2 Document in Chart   Glucose, capillary     Status: Abnormal   Collection Time: 02/05/17  4:00 PM  Result Value Ref Range   Glucose-Capillary 149 (H) 65 - 99 mg/dL  Glucose, capillary     Status: Abnormal   Collection Time: 02/05/17  8:27 PM  Result Value Ref Range   Glucose-Capillary 180 (H) 65 - 99 mg/dL   Comment 1 Notify RN    Comment 2 Document in Chart   Protime-INR     Status: Abnormal   Collection Time: 02/06/17  6:27 AM  Result Value Ref Range   Prothrombin Time 17.0 (H) 11.4 - 15.2 seconds   INR 1.37     ABGS No results for input(s): PHART, PO2ART, TCO2, HCO3 in the last 72 hours.  Invalid input(s): PCO2 CULTURES Recent Results (from the past 240 hour(s))  C difficile quick scan w PCR reflex     Status: None   Collection Time: 01/30/17  6:30 PM  Result Value Ref Range Status   C Diff antigen NEGATIVE NEGATIVE Final   C Diff toxin NEGATIVE NEGATIVE Final   C Diff interpretation No C. difficile detected.  Final  Gastrointestinal Panel by PCR , Stool     Status: None   Collection Time: 01/31/17 12:44 PM  Result Value Ref Range Status   Campylobacter species NOT DETECTED NOT DETECTED Final   Plesimonas shigelloides NOT DETECTED NOT DETECTED Final   Salmonella species NOT DETECTED NOT DETECTED Final   Yersinia enterocolitica NOT DETECTED NOT DETECTED Final   Vibrio species NOT DETECTED NOT DETECTED Final   Vibrio cholerae NOT DETECTED NOT DETECTED Final   Enteroaggregative E coli (EAEC) NOT DETECTED NOT DETECTED Final   Enteropathogenic E coli (EPEC) NOT DETECTED NOT DETECTED Final   Enterotoxigenic E coli (ETEC) NOT DETECTED NOT DETECTED Final   Shiga like toxin producing E coli (STEC) NOT DETECTED NOT DETECTED Final   Shigella/Enteroinvasive E coli (EIEC) NOT DETECTED NOT DETECTED Final   Cryptosporidium NOT DETECTED NOT DETECTED Final   Cyclospora cayetanensis NOT  DETECTED NOT DETECTED Final   Entamoeba histolytica NOT  DETECTED NOT DETECTED Final   Giardia lamblia NOT DETECTED NOT DETECTED Final   Adenovirus F40/41 NOT DETECTED NOT DETECTED Final   Astrovirus NOT DETECTED NOT DETECTED Final   Norovirus GI/GII NOT DETECTED NOT DETECTED Final   Rotavirus A NOT DETECTED NOT DETECTED Final   Sapovirus (I, II, IV, and V) NOT DETECTED NOT DETECTED Final  Urine Culture     Status: None   Collection Time: 02/04/17  8:23 AM  Result Value Ref Range Status   Specimen Description URINE, CLEAN CATCH  Final   Special Requests NONE  Final   Culture   Final    NO GROWTH Performed at [facility], [address], [place], [place] [address]    Report Status 02/05/2017 FINAL  Final   Studies/Results: No results found.  Medications:  Prior to Admission:  Prescriptions Prior to Admission  Medication Sig Dispense Refill Last Dose  . amLODipine (NORVASC) 10 MG tablet TAKE ONE TABLET BY MOUTH ONCE DAILY 90 tablet 1 01/29/2017 at Unknown time  . amoxicillin (AMOXIL) 500 MG capsule Take 2,000 mg by mouth as directed.    Taking  . atorvastatin (LIPITOR) 20 MG tablet TAKE ONE TABLET BY MOUTH ONCE DAILY 90 tablet 3 01/29/2017 at Unknown time  . carvedilol (COREG) 12.5 MG tablet TAKE ONE TABLET BY MOUTH TWICE DAILY 180 tablet 3 01/29/2017 at 1900  . Coenzyme Q10 (CO Q 10) 100 MG CAPS Take 1 capsule by mouth every morning.   01/29/2017 at Unknown time  . dorzolamide-timolol (COSOPT) 22.3-6.8 MG/ML ophthalmic solution Place 1 drop into both eyes 2 (two) times daily.   01/29/2017 at Unknown time  . glimepiride (AMARYL) 2 MG tablet Take 2 mg by mouth daily before breakfast.    01/29/2017 at Unknown time  . magnesium oxide (MAG-OX) 400 MG tablet Take 400 mg by mouth daily.   01/29/2017 at Unknown time  . Multiple Vitamins-Minerals (ICAPS PO) Take 2 tablets by mouth 3 (three) times daily after meals.    01/29/2017 at Unknown time  . pantoprazole (PROTONIX) 40 MG tablet Take 1 tablet (40 mg total) by mouth daily. 90 tablet 3 01/29/2017 at  Unknown time  . potassium chloride SA (K-DUR,KLOR-CON) 20 MEQ tablet Take 20 mEq by mouth daily.    01/29/2017 at Unknown time  . spironolactone (ALDACTONE) 25 MG tablet TAKE ONE TABLET BY MOUTH ONCE DAILY 45 tablet 3 01/29/2017 at Unknown time  . telmisartan (MICARDIS) 40 MG tablet Take 40 mg by mouth daily.   01/29/2017 at Unknown time  . torsemide (DEMADEX) 100 MG tablet Take 50 mg by mouth daily.    01/29/2017 at Unknown time  . warfarin (COUMADIN) 5 MG tablet Take 1 tablet daily except 1/2 tablet on Mondays, Wednesdays and Fridays (Patient taking differently: Take 1 tablet daily except 1/2 tablet on sundays, Tuesday, thursday and saturdays) 30 tablet 12 01/29/2017 at 1500   Scheduled: . atorvastatin  20 mg Oral Daily  . carvedilol  12.5 mg Oral BID  . dorzolamide-timolol  1 drop Both Eyes BID  . furosemide  40 mg Intravenous Q12H  . insulin aspart  0-15 Units Subcutaneous TID WC  . insulin aspart  0-5 Units Subcutaneous QHS  . levalbuterol  0.63 mg Nebulization BID  . multivitamin-lutein  1 capsule Oral TID PC  . pantoprazole  40 mg Oral Daily  . spironolactone  25 mg Oral Daily   Continuous:  NGE:[id number]  Assesment: He was admitted with diarrhea. The etiology of the diarrhea was obscured but now I think it may be related to the mass in his colon. He also may have had a impaction with diarrhea around that. At any rate he is better as far as the diarrhea is concerned. He had lower GI bleeding and underwent basically sigmoidoscopy yesterday and was found to have a mass. This is very likely colon cancer. He has multiple medical problems including history of coronary disease with coronary artery bypass grafting, hypertension, status post bioprosthetic aortic valve replacement, chronic Coumadin therapy for atrial fib COPD and congestive heart failure. He had acute kidney injury on admission and that's better. He's had some problem with volume overload because he was given fluids for his dehydration  but that's better. He is off Coumadin. Family says that they would like to proceed with surgery as soon as possible assuming that the biopsy shows cancer. I think he may need to have this resected whether it shows cancer or not because I don't think it could be removed and will eventually obstruct. Active Problems:   HYPERTENSION, BENIGN   S/P AVR   CORONARY ARTERY BYPASS GRAFT, HX OF   Hyponatremia   COPD (chronic obstructive pulmonary disease) (HCC)   Bilateral leg edema   Diarrhea   Chronic diastolic heart failure (HCC)   Dehydration   Acute kidney injury ([place])    Plan: I have requested cardiology consultation. He obviously will need cardiology clearance. I don't think he is a good candidate to have surgery done here because of his multiple medical problems.    LOS: 7 days   Medardo Hassing L 02/06/2017, 8:37 AM

## 2017-02-06 NOTE — Consult Note (Signed)
Cardiology Consultation:   Patient ID: QUATAVIOUS ROSSA; [id number]; 02-Jan-1932   Admit date: 01/30/2017 Date of Consult: 02/06/2017  Primary Care Provider: Sinda Du, MD Primary Cardiologist: Terry Wu Primary Electrophysiologist:  None   Patient Profile:   Terry Wu is a 83 y.o. male with a hx of AVR who is being seen today for the evaluation of preoperative clearance at the request of Dr Luan Pulling.  History of Present Illness:   Terry Wu 83 y.o. history of CABG/AVR in 2006 Had a normal myovue in 2012. Echo 08/23/15 with normal EF 60-65% and normal functioning pericardial tissue valve With mean gradient 10 mmHg and no significant peri valvular regurgitation. He has had diarrhea Colonoscopy yesterday showed large fungating and ulcerated non obstructing mass In the distal sigmoid colon. He has been off his coumadin. (for atrial fibrillation) and plans to transfer to [facility] for surgery. This am he has some bronchial congestion and is a bit Confused. His wife indicates his heart has been fine. No chest pain no acute dyspnea no palpitations or syncope Activity level has slowed over the last year. His atrial fibrillation is Chronic with good rate control.   Past Medical History:  Diagnosis Date  . Aortic aneurysm (HCC)    Ascending thoracic - 4.8 cm by CT 2017  . Aortic regurgitation    Bioprosthetic AVR 2006  . Atrial fibrillation with controlled ventricular response ([place]) 08/22/2015   CHADS2VASC=5, on Coumadin  . CAD (coronary artery disease)    Multivessel status post CABG 2006  . Complication of anesthesia   . Esophageal reflux   . Essential hypertension   . History of dizziness   . Mixed hyperlipidemia   . Type 2 diabetes mellitus ([place])     Past Surgical History:  Procedure Laterality Date  . AORTIC VALVE REPLACEMENT  2006   [facility] Pericardial tissue valve  . CORONARY ARTERY BYPASS GRAFT  2006   LIMA to LAD, SVG to diagonal, SVG to OM, SVG to PDA   . HERNIA  REPAIR    . TOTAL KNEE ARTHROPLASTY Bilateral      Inpatient Medications: Scheduled Meds: . atorvastatin  20 mg Oral Daily  . carvedilol  12.5 mg Oral BID  . dorzolamide-timolol  1 drop Both Eyes BID  . furosemide  40 mg Intravenous Q12H  . insulin aspart  0-15 Units Subcutaneous TID WC  . insulin aspart  0-5 Units Subcutaneous QHS  . levalbuterol  0.63 mg Nebulization BID  . multivitamin-lutein  1 capsule Oral TID PC  . pantoprazole  40 mg Oral Daily  . spironolactone  25 mg Oral Daily   Continuous Infusions:  PRN Meds: levalbuterol  Allergies:    Allergies  Allergen Reactions  . Aspirin     Unknown, per pt  . Demerol     Unknown, per pt    Social History:   Social History   Social History  . Marital status: Married    Spouse name: N/A  . Number of children: N/A  . Years of education: N/A   Occupational History  . Retired    Social History Main Topics  . Smoking status: Never Smoker  . Smokeless tobacco: Never Used  . Alcohol use No  . Drug use: No  . Sexual activity: Not Currently   Other Topics Concern  . Not on file   Social History Narrative  . No narrative on file    Family History:    Family History  Problem Relation Age of  Onset  . Heart Problems Mother   . Stroke Father   . Cancer Unknown      ROS:  Please see the history of present illness.  ROS  All other ROS reviewed and negative.     Physical Exam/Data:   Vitals:   02/05/17 1431 02/05/17 2041 02/06/17 0300 02/06/17 0620  BP: 110/62 123/68 (!) 121/59 (!) 136/49  Pulse: 62 76 (!) 54 87  Resp: 18 18  18   Temp: 98.7 F (37.1 C) 98.3 F (36.8 C)  99.5 F (37.5 C)  TempSrc: Oral Oral  Oral  SpO2: 96% 97% 94% 95%  Weight:    248 lb 14.4 oz (112.9 kg)  Height:        Intake/Output Summary (Last 24 hours) at 02/06/17 0836 Last data filed at 02/06/17 0107  Gross per 24 hour  Intake                0 ml  Output              350 ml  Net             -350 ml   Filed Weights     02/04/17 0544 02/05/17 0600 02/06/17 0620  Weight: 252 lb 9.6 oz (114.6 kg) 251 lb 4.8 oz (114 kg) 248 lb 14.4 oz (112.9 kg)   Body mass index is 36.76 kg/m.  General:  Well nourished, well developed, in no acute distress  HEENT: normal Lymph: no adenopathy Neck: no JVD Endocrine:  No thryomegaly Vascular: No carotid bruits; FA pulses 2+ bilaterally without bruits  Cardiac:  normal S1, S2; RRR; SEM through AVR with no AR  Lungs: bilateral rhonchi partial clearing with cough  no wheezing, rhonchi or rales  Abd: soft, nontender, no hepatomegaly  Ext: no edema Musculoskeletal:  No deformities, BUE and BLE strength normal and equal Skin: warm and dry  Neuro:  CNs 2-12 intact, no focal abnormalities noted Psych:  Normal affect   EKG:  The EKG was personally reviewed and demonstrates:   Atrial fibrillation non specific ST T wave changes  Telemetry:  Telemetry was personally reviewed and demonstrates:  afib no long pauses   Relevant CV Studies: Echo 2017 CABG/AVR report 2016 see HPI  Laboratory Data:  Chemistry Recent Labs Lab 02/02/17 0600 02/03/17 0629 02/04/17 0638  NA 133* 130* 133*  K 4.8 4.3 5.0  CL 99* 98* 97*  CO2 27 25 28   GLUCOSE 116* 95 133*  BUN 33* 32* 34*  CREATININE 1.44* 1.34* 1.42*  CALCIUM 8.1* 7.9* 8.0*  GFRNONAA 43* 47* 44*  GFRAA 50* 55* 51*  ANIONGAP 7 7 8      Recent Labs Lab 01/30/17 1330  PROT 6.7  ALBUMIN 3.5  AST 16  ALT 20  ALKPHOS 27*  BILITOT 0.8   Hematology Recent Labs Lab 02/04/17 0638 02/04/17 1540 02/05/17 0607  WBC 6.6 7.4 6.4  RBC 2.96* 3.09* 3.13*  HGB 9.6* 10.0* 10.0*  HCT 28.5* 29.5* 29.9*  MCV 96.3 95.5 95.5  MCH 32.4 32.4 31.9  MCHC 33.7 33.9 33.4  RDW 13.8 13.8 13.8  PLT 163 183 179   Cardiac EnzymesNo results for input(s): TROPONINI in the last 168 hours. No results for input(s): TROPIPOC in the last 168 hours.  BNPNo results for input(s): BNP, PROBNP in the last 168 hours.  DDimer No results for  input(s): DDIMER in the last 168 hours.  Radiology/Studies:  No results found.  Assessment and Plan:   1.  Preoperative Assessment: Clear to have colon surgery. Cardiac symptoms stable, AVR working normally , EF normal. Biggest issue is age, functional status and COPD with some need for pulmonary toilet. I suspect he will also have some delirium post op given current MS 2. Atrial Fibrillation:  Rate control good continue heparin while off coumadin 3. AVR: no change in murmur normal function by echo 4. CABG: no angina normal myovue 2012 no need for preop stress testing   Signed, Jenkins Rouge, MD  02/06/2017 8:36 AM

## 2017-02-06 NOTE — Consult Note (Addendum)
Reason for Consult:  Sigmoid colon cancer Referring Physician: Luan Pulling, [facility] - [place]   Terry Wu is an 83 y.o. male.  HPI: This is an 83 year old male with multiple medical issues who presents as a transfer from [facility].  He was hospitalized on 01/30/17  with dehydration and frequent diarrhea.  This has developed over the last month. At its worst, he was having more than a dozen stools per day. He denies any abdominal pain or gross blood. He has had decreased appetite. He denies any nausea, vomiting, fever, or chills. He underwent workup by GI which included a colonoscopy on 01/26/17.  The colonoscopy only reached the hepatic flexure. He had a 4.4 cm ulcerated friable mass just above the rectosigmoid junction. He had a 15 mm polyp snared in the proximal rectum as well as a 17 mm polyp snared in the distal rectum. Pathology showed that the large tumor is adenocarcinoma. The more proximal polyp showed tubulovillous adenoma and the more distal polyp showed high-grade dysplasia. This are both completely removed by polypectomy. The patient has multiple medical comorbidities and has been evaluated by cardiology. They feel that his surgery should be performed at a hospital other than [facility]. He is transferred to [facility] for further evaluation.  Unfortunately, the patient was given solid food to eat after his colonoscopy yesterday.  The patient has had a large protruding umbilical hernia for many years and this is causing no symptoms. He also has several large gallstones seen on CT scan. He reports no symptoms related to this. Normally he takes Coumadin but is currently heparinized.   Past Medical History:  Diagnosis Date  . Aortic aneurysm (HCC)    Ascending thoracic - 4.8 cm by CT 2017  . Aortic regurgitation    Bioprosthetic AVR 2006  . Atrial fibrillation with controlled ventricular response ([place]) 08/22/2015   CHADS2VASC=5, on Coumadin  . CAD (coronary artery disease)     Multivessel status post CABG 2006  . Complication of anesthesia   . Esophageal reflux   . Essential hypertension   . History of dizziness   . Mixed hyperlipidemia   . Type 2 diabetes mellitus ([place])     Past Surgical History:  Procedure Laterality Date  . AORTIC VALVE REPLACEMENT  2006   [place] Pericardial tissue valve  . CORONARY ARTERY BYPASS GRAFT  2006   LIMA to LAD, SVG to diagonal, SVG to OM, SVG to PDA   . HERNIA REPAIR    . TOTAL KNEE ARTHROPLASTY Bilateral     Family History  Problem Relation Age of Onset  . Heart Problems Mother   . Stroke Father   . Cancer Unknown     Social History:  reports that he has never smoked. He has never used smokeless tobacco. He reports that he does not drink alcohol or use drugs.  Allergies:  Allergies  Allergen Reactions  . Aspirin     Unknown, per pt  . Demerol     Unknown, per pt    Medications: Prior to Admission medications   Medication Sig Start Date End Date Taking? Authorizing Provider  amLODipine (NORVASC) 10 MG tablet TAKE ONE TABLET BY MOUTH ONCE DAILY 12/17/16  Yes Satira Sark, MD  amoxicillin (AMOXIL) 500 MG capsule Take 2,000 mg by mouth as directed.    Yes [provider]  atorvastatin (LIPITOR) 20 MG tablet TAKE ONE TABLET BY MOUTH ONCE DAILY 09/06/16  Yes Josue Hector, MD  carvedilol (COREG)  12.5 MG tablet TAKE ONE TABLET BY MOUTH TWICE DAILY 08/31/16  Yes Satira Sark, MD  Coenzyme Q10 (CO Q 10) 100 MG CAPS Take 1 capsule by mouth every morning.   Yes [provider]  dorzolamide-timolol (COSOPT) 22.3-6.8 MG/ML ophthalmic solution Place 1 drop into both eyes 2 (two) times daily.   Yes [provider]  glimepiride (AMARYL) 2 MG tablet Take 2 mg by mouth daily before breakfast.  02/16/13  Yes Sinda Du, MD  magnesium oxide (MAG-OX) 400 MG tablet Take 400 mg by mouth daily.   Yes [provider]  Multiple Vitamins-Minerals (ICAPS PO) Take 2 tablets by mouth 3  (three) times daily after meals.    Yes [provider]  pantoprazole (PROTONIX) 40 MG tablet Take 1 tablet (40 mg total) by mouth daily. 09/08/15  Yes Barrett, Evelene Croon, PA-C  potassium chloride SA (K-DUR,KLOR-CON) 20 MEQ tablet Take 20 mEq by mouth daily.    Yes [provider]  spironolactone (ALDACTONE) 25 MG tablet TAKE ONE TABLET BY MOUTH ONCE DAILY 09/06/16  Yes Satira Sark, MD  telmisartan (MICARDIS) 40 MG tablet Take 40 mg by mouth daily. 02/16/13  Yes Sinda Du, MD  torsemide (DEMADEX) 100 MG tablet Take 50 mg by mouth daily.    Yes [provider]  warfarin (COUMADIN) 5 MG tablet Take 1 tablet daily except 1/2 tablet on Mondays, Wednesdays and Fridays Patient taking differently: Take 1 tablet daily except 1/2 tablet on sundays, Tuesday, thursday and saturdays 04/23/16  Yes Satira Sark, MD     Results for orders placed or performed during the hospital encounter of 01/30/17 (from the past 48 hour(s))  Glucose, capillary     Status: Abnormal   Collection Time: 02/04/17  4:12 PM  Result Value Ref Range   Glucose-Capillary 139 (H) 65 - 99 mg/dL  Glucose, capillary     Status: Abnormal   Collection Time: 02/04/17  9:16 PM  Result Value Ref Range   Glucose-Capillary 142 (H) 65 - 99 mg/dL  Protime-INR     Status: Abnormal   Collection Time: 02/05/17  6:07 AM  Result Value Ref Range   Prothrombin Time 17.8 (H) 11.4 - 15.2 seconds   INR 1.45   CBC     Status: Abnormal   Collection Time: 02/05/17  6:07 AM  Result Value Ref Range   WBC 6.4 4.0 - 10.5 K/uL   RBC 3.13 (L) 4.22 - 5.81 MIL/uL   Hemoglobin 10.0 (L) 13.0 - 17.0 g/dL   HCT 29.9 (L) 39.0 - 52.0 %   MCV 95.5 78.0 - 100.0 fL   MCH 31.9 26.0 - 34.0 pg   MCHC 33.4 30.0 - 36.0 g/dL   RDW 13.8 11.5 - 15.5 %   Platelets 179 150 - 400 K/uL  Glucose, capillary     Status: Abnormal   Collection Time: 02/05/17  7:43 AM  Result Value Ref Range   Glucose-Capillary 128 (H) 65 - 99 mg/dL    Comment 1 Notify RN    Comment 2 Document in Chart   CEA     Status: None   Collection Time: 02/05/17 10:24 AM  Result Value Ref Range   CEA 1.4 0.0 - 4.7 ng/mL    Comment: (NOTE)       Roche ECLIA methodology       Nonsmokers  <3.9  Smokers     <5.6 Performed At: [facility] [place], [place] [id number] Lindon Romp MD [id number]   Glucose, capillary     Status: None   Collection Time: 02/05/17 11:36 AM  Result Value Ref Range   Glucose-Capillary 96 65 - 99 mg/dL   Comment 1 Notify RN    Comment 2 Document in Chart   Glucose, capillary     Status: Abnormal   Collection Time: 02/05/17  4:00 PM  Result Value Ref Range   Glucose-Capillary 149 (H) 65 - 99 mg/dL  Glucose, capillary     Status: Abnormal   Collection Time: 02/05/17  8:27 PM  Result Value Ref Range   Glucose-Capillary 180 (H) 65 - 99 mg/dL   Comment 1 Notify RN    Comment 2 Document in Chart   Protime-INR     Status: Abnormal   Collection Time: 02/06/17  6:27 AM  Result Value Ref Range   Prothrombin Time 17.0 (H) 11.4 - 15.2 seconds   INR 1.37   CBC with Differential/Platelet     Status: Abnormal   Collection Time: 02/06/17  6:27 AM  Result Value Ref Range   WBC 13.8 (H) 4.0 - 10.5 K/uL   RBC 3.14 (L) 4.22 - 5.81 MIL/uL   Hemoglobin 10.2 (L) 13.0 - 17.0 g/dL   HCT 30.8 (L) 39.0 - 52.0 %   MCV 98.1 78.0 - 100.0 fL   MCH 32.5 26.0 - 34.0 pg   MCHC 33.1 30.0 - 36.0 g/dL   RDW 14.0 11.5 - 15.5 %   Platelets 191 150 - 400 K/uL   Neutrophils Relative % 80 %   Neutro Abs 11.0 (H) 1.7 - 7.7 K/uL   Lymphocytes Relative 9 %   Lymphs Abs 1.3 0.7 - 4.0 K/uL   Monocytes Relative 10 %   Monocytes Absolute 1.4 (H) 0.1 - 1.0 K/uL   Eosinophils Relative 1 %   Eosinophils Absolute 0.2 0.0 - 0.7 K/uL   Basophils Relative 0 %   Basophils Absolute 0.0 0.0 - 0.1 K/uL  Basic metabolic panel     Status: Abnormal   Collection Time: 02/06/17  6:27 AM  Result  Value Ref Range   Sodium 134 (L) 135 - 145 mmol/L   Potassium 4.5 3.5 - 5.1 mmol/L   Chloride 98 (L) 101 - 111 mmol/L   CO2 29 22 - 32 mmol/L   Glucose, Bld 135 (H) 65 - 99 mg/dL   BUN 15 6 - 20 mg/dL   Creatinine, Ser 1.03 0.61 - 1.24 mg/dL   Calcium 8.5 (L) 8.9 - 10.3 mg/dL   GFR calc non Af Amer >60 >60 mL/min   GFR calc Af Amer >60 >60 mL/min    Comment: (NOTE) The eGFR has been calculated using the CKD EPI equation. This calculation has not been validated in all clinical situations. eGFR's persistently <60 mL/min signify possible Chronic Kidney Disease.    Anion gap 7 5 - 15  Glucose, capillary     Status: Abnormal   Collection Time: 02/06/17  8:42 AM  Result Value Ref Range   Glucose-Capillary 200 (H) 65 - 99 mg/dL   Comment 1 Notify RN    Comment 2 Document in Chart   Glucose, capillary     Status: Abnormal   Collection Time: 02/06/17 11:08 AM  Result Value Ref Range   Glucose-Capillary 160 (H) 65 - 99 mg/dL   Comment 1 Notify RN    Comment 2  Document in Chart     CLINICAL DATA:  Diarrhea for 3 weeks, abdominal swelling, weakness, shortness of breath  EXAM: CT ABDOMEN AND PELVIS WITH CONTRAST  TECHNIQUE: Multidetector CT imaging of the abdomen and pelvis was performed using the standard protocol following bolus administration of intravenous contrast. Sagittal and coronal images were reconstructed from the axial data set.  CONTRAST:  75 cc Isovue 300 IV. Dilute oral contrast.  COMPARISON:  12/04/2010  FINDINGS: Lower chest: Mild LEFT basilar atelectasis.  Hepatobiliary: Multiple calcified gallstones in gallbladder largest 2.3 cm diameter. No definite gallbladder wall thickening. Liver unremarkable. No biliary dilatation.  Pancreas: Normal appearance  Spleen: Normal appearance.  Tiny splenules anterior to spleen.  Adrenals/Urinary Tract: Small nonobstructing calculus inferior pole LEFT kidney. Adrenal glands, kidneys, ureters and bladder  otherwise unremarkable.  Stomach/Bowel: Increased stool throughout colon. Surgical clips at gastroesophageal junction question prior anti reflux procedure. Duodenal diverticulum noted. Remaining bowel loops and stomach unremarkable.  Vascular/Lymphatic: Extensive atherosclerotic calcifications extensive atherosclerotic disease aorta and coronary arteries. Aorta normal caliber. Extensive calcified atherosclerotic plaque at the origins of the celiac artery, SMA and renal arteries. No adenopathy.  Reproductive: Brachytherapy seed implants within prostate bed  Other: BILATERAL inguinal hernias containing fat. Moderate-sized hernia containing fat. No free intraperitoneal air or fluid. No definite inflammatory process identified.  Musculoskeletal: Scattered degenerative disc and facet disease changes of the thoracolumbar spine. No acute osseous findings.  IMPRESSION: Umbilical and BILATERAL inguinal hernias containing fat.  Increased stool in colon.  Small nonobstructing LEFT renal calculus.  Cholelithiasis without definite CT evidence of cholecystitis though if this is a clinical concern recommend ultrasound follow-up.  Extensive calcified atherosclerotic plaques of the abdominal aorta including the origins of the abdominal visceral vessels.  Coronary artery calcifications.  No acute intra-abdominal or intrapelvic process identified.   Electronically Signed   By: Lavonia Dana M.D.   On: 01/30/2017 17:47  Review of Systems  Constitutional: Negative for weight loss.  HENT: Negative for ear discharge, ear pain, hearing loss and tinnitus.   Eyes: Negative for blurred vision, double vision, photophobia and pain.  Respiratory: Positive for shortness of breath. Negative for cough and sputum production.   Cardiovascular: Negative for chest pain.  Gastrointestinal: Positive for blood in stool and diarrhea. Negative for abdominal pain, nausea and vomiting.   Genitourinary: Negative for dysuria, flank pain, frequency and urgency.  Musculoskeletal: Negative for back pain, falls, joint pain, myalgias and neck pain.  Neurological: Negative for dizziness, tingling, sensory change, focal weakness, loss of consciousness and headaches.  Endo/Heme/Allergies: Does not bruise/bleed easily.  Psychiatric/Behavioral: Negative for depression, memory loss and substance abuse. The patient is not nervous/anxious.    Blood pressure (!) 118/58, pulse 78, temperature 98.5 F (36.9 C), temperature source Oral, resp. rate 18, height '5\' 9"'$  (1.753 m), weight 112.9 kg (248 lb 14.4 oz), SpO2 96 %. Physical Exam Obese elderly male in NAD Eyes:  Pupils equal, round; sclera anicteric HENT:  Oral mucosa moist; good dentition  Neck:  No masses palpated, no thyromegaly Lungs:  Bilateral rhonchi; no wheezing; becomes dyspneic when bed is placed in supine position CV:  Regular rate and rhythm; systolic ejection murmur; extremities well-perfused with no edema Abd:  +bowel sounds, soft, obese, non-tender; large protruding 8 cm umbilical hernia Skin:  Warm, dry; no sign of jaundice Psychiatric - alert and oriented x 4; calm mood and affect  Assessment/Plan: 1.  Large sigmoid (rectosigmoid junction) colon cancer 2.  Rectal tubulovillous adenoma/ high grade dysplasia - addressed  by colonoscopy 3.  Cholelithiasis - probably asymptomatic 4.  Large chronically incarcerated umbilical hernia 5.  Hx of prostate cancer with radiation seeds  Recs:  Clear liquids/ NPO past midnight.  DO NOT GIVE HIM SOLID FOOD OR IT WILL DELAY HIS CASE Will attempt limited bowel prep with one dose of Miralax Patient will probably end up with a colostomy because of the radiation in his pelvis.  We will have him marked by WOCN prior to surgery.    Recommend sigmoid colectomy with possible colostomy.  Will also repair his umbilical hernia primarily.  If the patient is doing well intraoperatively, then we  may also perform cholecystectomy to address the large cholelithiasis and to prevent future cholecystitis.  Will plan surgery for 8/16.  The surgical procedure has been discussed with the patient.  Potential risks, benefits, alternative treatments, and expected outcomes have been explained.  All of the patient's questions at this time have been answered.  The likelihood of reaching the patient's treatment goal is good.  The patient understand the proposed surgical procedure and wishes to proceed.   Imogene Burn. Georgette Dover, MD, [facility]  General/ Trauma Surgery  02/06/2017 4:03 PM     Alba Kriesel K. 02/06/2017, 3:41 PM

## 2017-02-06 NOTE — Progress Notes (Signed)
Patient arrived at the facility via [facility] Emergency services.  Admitted to room 3E25.  Oriented to unit, call light use and mealtimes.  No concerns voiced at this time. Marcille Blanco, RN

## 2017-02-07 ENCOUNTER — Inpatient Hospital Stay (HOSPITAL_COMMUNITY): Payer: Medicare Other | Admitting: Anesthesiology

## 2017-02-07 ENCOUNTER — Encounter (HOSPITAL_COMMUNITY): Payer: Self-pay | Admitting: Certified Registered"

## 2017-02-07 ENCOUNTER — Encounter (HOSPITAL_COMMUNITY)
Admission: AD | Disposition: A | Discharge status: Rehabilitation Facility | Payer: Self-pay | Source: Ambulatory Visit | Attending: Pulmonary Disease

## 2017-02-07 ENCOUNTER — Inpatient Hospital Stay (HOSPITAL_COMMUNITY): Payer: Medicare Other

## 2017-02-07 DIAGNOSIS — I10 Essential (primary) hypertension: Secondary | ICD-10-CM

## 2017-02-07 DIAGNOSIS — J439 Emphysema, unspecified: Secondary | ICD-10-CM

## 2017-02-07 DIAGNOSIS — E871 Hypo-osmolality and hyponatremia: Secondary | ICD-10-CM

## 2017-02-07 DIAGNOSIS — J9601 Acute respiratory failure with hypoxia: Secondary | ICD-10-CM

## 2017-02-07 DIAGNOSIS — Z01810 Encounter for preprocedural cardiovascular examination: Secondary | ICD-10-CM

## 2017-02-07 DIAGNOSIS — I5032 Chronic diastolic (congestive) heart failure: Secondary | ICD-10-CM

## 2017-02-07 HISTORY — PX: COLON RESECTION SIGMOID: SHX6737

## 2017-02-07 HISTORY — PX: UMBILICAL HERNIA REPAIR: SHX196

## 2017-02-07 LAB — GLUCOSE, CAPILLARY
GLUCOSE-CAPILLARY: 98 mg/dL (ref 65–99)
GLUCOSE-CAPILLARY: 91 mg/dL (ref 65–99)
Glucose-Capillary: 128 mg/dL — ABNORMAL HIGH (ref 65–99)
Glucose-Capillary: 139 mg/dL — ABNORMAL HIGH (ref 65–99)

## 2017-02-07 LAB — CBC WITH DIFFERENTIAL/PLATELET
BASOS ABS: 0 10*3/uL (ref 0.0–0.1)
Basophils Relative: 0 %
EOS PCT: 0 %
Eosinophils Absolute: 0 10*3/uL (ref 0.0–0.7)
HCT: 29.8 % — ABNORMAL LOW (ref 39.0–52.0)
Hemoglobin: 9.7 g/dL — ABNORMAL LOW (ref 13.0–17.0)
LYMPHS ABS: 0.8 10*3/uL (ref 0.7–4.0)
LYMPHS PCT: 6 %
MCH: 31.5 pg (ref 26.0–34.0)
MCHC: 32.6 g/dL (ref 30.0–36.0)
MCV: 96.8 fL (ref 78.0–100.0)
MONO ABS: 0.6 10*3/uL (ref 0.1–1.0)
Monocytes Relative: 4 %
NEUTROS PCT: 90 %
Neutro Abs: 12.6 10*3/uL — ABNORMAL HIGH (ref 1.7–7.7)
PLATELETS: 170 10*3/uL (ref 150–400)
RBC: 3.08 MIL/uL — AB (ref 4.22–5.81)
RDW: 13.7 % (ref 11.5–15.5)
WBC: 14 10*3/uL — AB (ref 4.0–10.5)

## 2017-02-07 LAB — COMPREHENSIVE METABOLIC PANEL
ALT: 24 U/L (ref 17–63)
AST: 20 U/L (ref 15–41)
Albumin: 2.8 g/dL — ABNORMAL LOW (ref 3.5–5.0)
Alkaline Phosphatase: 29 U/L — ABNORMAL LOW (ref 38–126)
Anion gap: 8 (ref 5–15)
BUN: 15 mg/dL (ref 6–20)
CHLORIDE: 99 mmol/L — AB (ref 101–111)
CO2: 27 mmol/L (ref 22–32)
Calcium: 8.4 mg/dL — ABNORMAL LOW (ref 8.9–10.3)
Creatinine, Ser: 1.19 mg/dL (ref 0.61–1.24)
GFR calc Af Amer: >60 mL/min (ref >60)
GFR, EST NON AFRICAN AMERICAN: 55 mL/min — AB (ref >60)
Glucose, Bld: 140 mg/dL — ABNORMAL HIGH (ref 65–99)
POTASSIUM: 4.7 mmol/L (ref 3.5–5.1)
Sodium: 134 mmol/L — ABNORMAL LOW (ref 135–145)
Total Bilirubin: 1 mg/dL (ref 0.3–1.2)
Total Protein: 5.8 g/dL — ABNORMAL LOW (ref 6.5–8.1)

## 2017-02-07 LAB — BLOOD GAS, ARTERIAL
ACID-BASE EXCESS: 0.6 mmol/L (ref 0.0–2.0)
BICARBONATE: 26.4 mmol/L (ref 20.0–28.0)
Drawn by: 51133
FIO2: 40
LHR: 14 {breaths}/min
MECHVT: 560 mL
O2 SAT: 97.4 %
PEEP/CPAP: 5 cmH2O
PH ART: 7.294 — AB (ref 7.350–7.450)
Patient temperature: 98.6
pCO2 arterial: 56.1 mmHg — ABNORMAL HIGH (ref 32.0–48.0)
pO2, Arterial: 110 mmHg — ABNORMAL HIGH (ref 83.0–108.0)

## 2017-02-07 LAB — PROTIME-INR
INR: 1.35
INR: 1.32
PROTHROMBIN TIME: 16.7 s — AB (ref 11.4–15.2)
PROTHROMBIN TIME: 16.4 s — AB (ref 11.4–15.2)

## 2017-02-07 LAB — CORTISOL: Cortisol, Plasma: 13.1 ug/dL

## 2017-02-07 LAB — POCT I-STAT 7, (LYTES, BLD GAS, ICA,H+H)
ACID-BASE EXCESS: 4 mmol/L — AB (ref 0.0–2.0)
Bicarbonate: 28.6 mmol/L — ABNORMAL HIGH (ref 20.0–28.0)
CALCIUM ION: 1.15 mmol/L (ref 1.15–1.40)
HEMATOCRIT: 24 % — AB (ref 39.0–52.0)
Hemoglobin: 8.2 g/dL — ABNORMAL LOW (ref 13.0–17.0)
O2 Saturation: 98 %
PH ART: 7.419 (ref 7.350–7.450)
POTASSIUM: 4.3 mmol/L (ref 3.5–5.1)
Patient temperature: 36.5
SODIUM: 134 mmol/L — AB (ref 135–145)
TCO2: 30 mmol/L (ref 0–100)
pCO2 arterial: 44 mmHg (ref 32.0–48.0)
pO2, Arterial: 98 mmHg (ref 83.0–108.0)

## 2017-02-07 LAB — ABO/RH: ABO/RH(D): O POS

## 2017-02-07 LAB — PHOSPHORUS: PHOSPHORUS: 3.3 mg/dL (ref 2.5–4.6)

## 2017-02-07 LAB — TYPE AND SCREEN
ABO/RH(D): O POS
Antibody Screen: NEGATIVE

## 2017-02-07 LAB — TROPONIN I: TROPONIN I: 0.07 ng/mL — AB (ref <0.03)

## 2017-02-07 LAB — MRSA PCR SCREENING: MRSA BY PCR: NEGATIVE

## 2017-02-07 LAB — MAGNESIUM: MAGNESIUM: 1.8 mg/dL (ref 1.7–2.4)

## 2017-02-07 SURGERY — COLECTOMY, SIGMOID, OPEN
Anesthesia: General | Site: Abdomen

## 2017-02-07 MED ORDER — BUPIVACAINE-EPINEPHRINE (PF) 0.25% -1:200000 IJ SOLN
INTRAMUSCULAR | Status: AC
  Filled 2017-02-07: qty 30

## 2017-02-07 MED ORDER — IPRATROPIUM BROMIDE 0.02 % IN SOLN
0.5000 mg | Freq: Four times a day (QID) | RESPIRATORY_TRACT | Status: DC
  Administered 2017-02-07 – 2017-02-10 (×11): 0.5 mg via RESPIRATORY_TRACT
  Filled 2017-02-07 (×11): qty 2.5

## 2017-02-07 MED ORDER — INSULIN ASPART 100 UNIT/ML ~~LOC~~ SOLN
0.0000 [IU] | SUBCUTANEOUS | Status: DC
  Administered 2017-02-07 – 2017-02-09 (×9): 2 [IU] via SUBCUTANEOUS

## 2017-02-07 MED ORDER — LEVALBUTEROL HCL 0.63 MG/3ML IN NEBU
0.6300 mg | INHALATION_SOLUTION | Freq: Four times a day (QID) | RESPIRATORY_TRACT | Status: DC
  Administered 2017-02-07 – 2017-02-10 (×11): 0.63 mg via RESPIRATORY_TRACT
  Filled 2017-02-07 (×22): qty 3

## 2017-02-07 MED ORDER — DEXAMETHASONE SODIUM PHOSPHATE 10 MG/ML IJ SOLN
INTRAMUSCULAR | Status: DC | PRN
  Administered 2017-02-07: 5 mg via INTRAVENOUS

## 2017-02-07 MED ORDER — DEXMEDETOMIDINE HCL IN NACL 200 MCG/50ML IV SOLN
INTRAVENOUS | Status: AC
  Filled 2017-02-07: qty 50

## 2017-02-07 MED ORDER — FENTANYL CITRATE (PF) 100 MCG/2ML IJ SOLN: 50.0000 ug | INTRAMUSCULAR | Status: DC | PRN

## 2017-02-07 MED ORDER — PHENYLEPHRINE HCL 10 MG/ML IJ SOLN
INTRAVENOUS | Status: DC | PRN
  Administered 2017-02-07: 20 ug/min via INTRAVENOUS

## 2017-02-07 MED ORDER — SUCCINYLCHOLINE CHLORIDE 20 MG/ML IJ SOLN
INTRAMUSCULAR | Status: DC | PRN
  Administered 2017-02-07: 140 mg via INTRAVENOUS

## 2017-02-07 MED ORDER — SODIUM CHLORIDE 0.9 % IV SOLN
0.0000 ug/min | INTRAVENOUS | Status: DC
  Administered 2017-02-07: 40 ug/min via INTRAVENOUS
  Filled 2017-02-07: qty 1

## 2017-02-07 MED ORDER — HYDROMORPHONE HCL 1 MG/ML IJ SOLN: 0.2500 mg | INTRAMUSCULAR | Status: DC | PRN

## 2017-02-07 MED ORDER — LACTATED RINGERS IV SOLN
INTRAVENOUS | Status: DC
  Administered 2017-02-07 (×2): via INTRAVENOUS

## 2017-02-07 MED ORDER — CEFOTETAN DISODIUM-DEXTROSE 2-2.08 GM-% IV SOLR
INTRAVENOUS | Status: DC
Start: 2017-02-07 — End: 2017-02-07
  Filled 2017-02-07: qty 50

## 2017-02-07 MED ORDER — FENTANYL CITRATE (PF) 100 MCG/2ML IJ SOLN
INTRAMUSCULAR | Status: DC | PRN
  Administered 2017-02-07: 100 ug via INTRAVENOUS
  Administered 2017-02-07 (×2): 50 ug via INTRAVENOUS

## 2017-02-07 MED ORDER — FENTANYL CITRATE (PF) 250 MCG/5ML IJ SOLN
INTRAMUSCULAR | Status: AC
  Filled 2017-02-07: qty 5

## 2017-02-07 MED ORDER — DEXMEDETOMIDINE HCL IN NACL 200 MCG/50ML IV SOLN
INTRAVENOUS | Status: DC | PRN
  Administered 2017-02-07: .5 ug/kg/h via INTRAVENOUS

## 2017-02-07 MED ORDER — MIDAZOLAM HCL 2 MG/2ML IJ SOLN: 1.0000 mg | INTRAMUSCULAR | Status: DC | PRN

## 2017-02-07 MED ORDER — FENTANYL BOLUS VIA INFUSION
25.0000 ug | INTRAVENOUS | Status: DC | PRN
  Administered 2017-02-07 – 2017-02-08 (×2): 50 ug via INTRAVENOUS
  Filled 2017-02-07: qty 50

## 2017-02-07 MED ORDER — PROPOFOL 10 MG/ML IV BOLUS
INTRAVENOUS | Status: DC | PRN
  Administered 2017-02-07: 20 mg via INTRAVENOUS
  Administered 2017-02-07: 120 mg via INTRAVENOUS

## 2017-02-07 MED ORDER — PHENYLEPHRINE 40 MCG/ML (10ML) SYRINGE FOR IV PUSH (FOR BLOOD PRESSURE SUPPORT)
PREFILLED_SYRINGE | INTRAVENOUS | Status: AC
  Filled 2017-02-07: qty 10

## 2017-02-07 MED ORDER — DEXAMETHASONE SODIUM PHOSPHATE 10 MG/ML IJ SOLN
INTRAMUSCULAR | Status: AC
  Filled 2017-02-07: qty 1

## 2017-02-07 MED ORDER — PHENYLEPHRINE 40 MCG/ML (10ML) SYRINGE FOR IV PUSH (FOR BLOOD PRESSURE SUPPORT)
PREFILLED_SYRINGE | INTRAVENOUS | Status: DC | PRN
  Administered 2017-02-07: 120 ug via INTRAVENOUS
  Administered 2017-02-07: 40 ug via INTRAVENOUS
  Administered 2017-02-07: 80 ug via INTRAVENOUS

## 2017-02-07 MED ORDER — ONDANSETRON HCL 4 MG/2ML IJ SOLN
INTRAMUSCULAR | Status: DC | PRN
  Administered 2017-02-07: 4 mg via INTRAVENOUS

## 2017-02-07 MED ORDER — SUGAMMADEX SODIUM 500 MG/5ML IV SOLN
INTRAVENOUS | Status: AC
  Filled 2017-02-07: qty 5

## 2017-02-07 MED ORDER — ONDANSETRON HCL 4 MG/2ML IJ SOLN
INTRAMUSCULAR | Status: AC
  Filled 2017-02-07: qty 2

## 2017-02-07 MED ORDER — 0.9 % SODIUM CHLORIDE (POUR BTL) OPTIME
TOPICAL | Status: DC | PRN
  Administered 2017-02-07 (×2): 2000 mL

## 2017-02-07 MED ORDER — PROPOFOL 10 MG/ML IV BOLUS
INTRAVENOUS | Status: AC
  Filled 2017-02-07: qty 20

## 2017-02-07 MED ORDER — PANTOPRAZOLE SODIUM 40 MG IV SOLR
40.0000 mg | INTRAVENOUS | Status: DC
  Administered 2017-02-07 – 2017-02-09 (×3): 40 mg via INTRAVENOUS
  Filled 2017-02-07 (×3): qty 40

## 2017-02-07 MED ORDER — SUGAMMADEX SODIUM 200 MG/2ML IV SOLN
INTRAVENOUS | Status: DC | PRN
  Administered 2017-02-07: 230 mg via INTRAVENOUS

## 2017-02-07 MED ORDER — FENTANYL 2500MCG IN NS 250ML (10MCG/ML) PREMIX INFUSION
25.0000 ug/h | INTRAVENOUS | Status: DC
  Administered 2017-02-07: 50 ug/h via INTRAVENOUS
  Filled 2017-02-07: qty 250

## 2017-02-07 MED ORDER — ORAL CARE MOUTH RINSE
15.0000 mL | Freq: Four times a day (QID) | OROMUCOSAL | Status: DC
  Administered 2017-02-08 – 2017-02-12 (×15): 15 mL via OROMUCOSAL

## 2017-02-07 MED ORDER — FENTANYL BOLUS VIA INFUSION
25.0000 ug | INTRAVENOUS | Status: DC | PRN
  Filled 2017-02-07: qty 25

## 2017-02-07 MED ORDER — ROCURONIUM BROMIDE 10 MG/ML (PF) SYRINGE
PREFILLED_SYRINGE | INTRAVENOUS | Status: DC | PRN
  Administered 2017-02-07: 50 mg via INTRAVENOUS
  Administered 2017-02-07: 20 mg via INTRAVENOUS

## 2017-02-07 MED ORDER — LACTATED RINGERS IV SOLN
INTRAVENOUS | Status: DC
  Administered 2017-02-07: 18:00:00 via INTRAVENOUS

## 2017-02-07 MED ORDER — FENTANYL CITRATE (PF) 100 MCG/2ML IJ SOLN: 25.0000 ug | INTRAMUSCULAR | Status: DC | PRN

## 2017-02-07 MED ORDER — SODIUM CHLORIDE 0.9 % IV SOLN
0.4000 ug/kg/h | INTRAVENOUS | Status: DC
  Administered 2017-02-07: 0.4 ug/kg/h via INTRAVENOUS
  Filled 2017-02-07 (×2): qty 2

## 2017-02-07 MED ORDER — CEFOTETAN DISODIUM 1 G IJ SOLR
1.0000 g | Freq: Once | INTRAMUSCULAR | Status: AC
  Administered 2017-02-07: 1 g via INTRAVENOUS
  Filled 2017-02-07: qty 1

## 2017-02-07 MED ORDER — ALVIMOPAN 12 MG PO CAPS
12.0000 mg | ORAL_CAPSULE | ORAL | Status: AC
  Administered 2017-02-07: 12 mg via ORAL
  Filled 2017-02-07: qty 1

## 2017-02-07 MED ORDER — CHLORHEXIDINE GLUCONATE 0.12% ORAL RINSE (MEDLINE KIT)
15.0000 mL | Freq: Two times a day (BID) | OROMUCOSAL | Status: DC
  Administered 2017-02-07 – 2017-02-14 (×10): 15 mL via OROMUCOSAL

## 2017-02-07 MED ORDER — DEXTROSE 5 % IV SOLN
INTRAVENOUS | Status: DC | PRN
  Administered 2017-02-07: 1 g via INTRAVENOUS

## 2017-02-07 MED ORDER — LIDOCAINE 2% (20 MG/ML) 5 ML SYRINGE
INTRAMUSCULAR | Status: DC | PRN
  Administered 2017-02-07: 60 mg via INTRAVENOUS

## 2017-02-07 MED ORDER — FENTANYL CITRATE (PF) 100 MCG/2ML IJ SOLN: 50.0000 ug | Freq: Once | INTRAMUSCULAR | Status: DC

## 2017-02-07 SURGICAL SUPPLY — 70 items
BENZOIN TINCTURE PRP APPL 2/3 (GAUZE/BANDAGES/DRESSINGS) IMPLANT
BLADE CLIPPER SURG (BLADE) ×3 IMPLANT
BLADE SURG 15 STRL LF DISP TIS (BLADE) ×2 IMPLANT
BLADE SURG 15 STRL SS (BLADE) ×1
CANISTER SUCT 3000ML PPV (MISCELLANEOUS) ×6 IMPLANT
CHLORAPREP W/TINT 26ML (MISCELLANEOUS) ×3 IMPLANT
COVER MAYO STAND STRL (DRAPES) ×6 IMPLANT
COVER SURGICAL LIGHT HANDLE (MISCELLANEOUS) ×3 IMPLANT
DRAPE HALF SHEET 40X57 (DRAPES) ×3 IMPLANT
DRAPE LAPAROSCOPIC ABDOMINAL (DRAPES) ×3 IMPLANT
DRAPE LAPAROTOMY 100X72 PEDS (DRAPES) IMPLANT
DRAPE UTILITY XL STRL (DRAPES) IMPLANT
DRAPE WARM FLUID 44X44 (DRAPE) ×3 IMPLANT
DRSG OPSITE POSTOP 4X10 (GAUZE/BANDAGES/DRESSINGS) ×3 IMPLANT
DRSG OPSITE POSTOP 4X8 (GAUZE/BANDAGES/DRESSINGS) IMPLANT
DRSG TEGADERM 4X4.75 (GAUZE/BANDAGES/DRESSINGS) IMPLANT
ELECT BLADE 6.5 EXT (BLADE) ×3 IMPLANT
ELECT CAUTERY BLADE 6.4 (BLADE) ×6 IMPLANT
ELECT REM PT RETURN 9FT ADLT (ELECTROSURGICAL) ×3
ELECTRODE REM PT RTRN 9FT ADLT (ELECTROSURGICAL) ×2 IMPLANT
GAUZE SPONGE 2X2 8PLY STRL LF (GAUZE/BANDAGES/DRESSINGS) IMPLANT
GAUZE SPONGE 4X4 16PLY XRAY LF (GAUZE/BANDAGES/DRESSINGS) IMPLANT
GLOVE BIO SURGEON STRL SZ7 (GLOVE) ×9 IMPLANT
GLOVE BIOGEL PI IND STRL 7.5 (GLOVE) ×4 IMPLANT
GLOVE BIOGEL PI INDICATOR 7.5 (GLOVE) ×2
GOWN STRL REUS W/ TWL LRG LVL3 (GOWN DISPOSABLE) ×6 IMPLANT
GOWN STRL REUS W/TWL LRG LVL3 (GOWN DISPOSABLE) ×3
KIT BASIN OR (CUSTOM PROCEDURE TRAY) ×3 IMPLANT
KIT OSTOMY DRAINABLE 2.75 STR (WOUND CARE) ×3 IMPLANT
KIT ROOM TURNOVER OR (KITS) ×3 IMPLANT
LEGGING LITHOTOMY PAIR STRL (DRAPES) ×3 IMPLANT
LIGASURE IMPACT 36 18CM CVD LR (INSTRUMENTS) ×3 IMPLANT
NEEDLE HYPO 25GX1X1/2 BEV (NEEDLE) IMPLANT
NS IRRIG 1000ML POUR BTL (IV SOLUTION) ×6 IMPLANT
PACK GENERAL/GYN (CUSTOM PROCEDURE TRAY) ×3 IMPLANT
PACK SURGICAL SETUP 50X90 (CUSTOM PROCEDURE TRAY) ×3 IMPLANT
PAD ARMBOARD 7.5X6 YLW CONV (MISCELLANEOUS) ×6 IMPLANT
PENCIL BUTTON HOLSTER BLD 10FT (ELECTRODE) ×3 IMPLANT
SPECIMEN JAR LARGE (MISCELLANEOUS) ×3 IMPLANT
SPONGE GAUZE 2X2 STER 10/PKG (GAUZE/BANDAGES/DRESSINGS)
SPONGE LAP 18X18 X RAY DECT (DISPOSABLE) IMPLANT
STAPLER CUT CVD 40MM GREEN (STAPLE) ×3 IMPLANT
STAPLER PROXIMATE 75MM BLUE (STAPLE) ×3 IMPLANT
STAPLER VISISTAT 35W (STAPLE) ×3 IMPLANT
STRIP CLOSURE SKIN 1/2X4 (GAUZE/BANDAGES/DRESSINGS) IMPLANT
SUCTION POOLE TIP (SUCTIONS) ×3 IMPLANT
SURGILUBE 2OZ TUBE FLIPTOP (MISCELLANEOUS) ×3 IMPLANT
SUT MNCRL AB 4-0 PS2 18 (SUTURE) IMPLANT
SUT NOVA NAB DX-16 0-1 5-0 T12 (SUTURE) IMPLANT
SUT NOVA NAB GS-21 0 18 T12 DT (SUTURE) IMPLANT
SUT PDS AB 1 TP1 96 (SUTURE) ×6 IMPLANT
SUT PROLENE 2 0 CT2 30 (SUTURE) IMPLANT
SUT PROLENE 2 0 KS (SUTURE) IMPLANT
SUT SILK 2 0 SH CR/8 (SUTURE) ×3 IMPLANT
SUT SILK 2 0 TIES 10X30 (SUTURE) ×3 IMPLANT
SUT SILK 3 0 SH CR/8 (SUTURE) ×3 IMPLANT
SUT SILK 3 0 TIES 10X30 (SUTURE) ×3 IMPLANT
SUT VIC AB 3-0 SH 18 (SUTURE) IMPLANT
SUT VIC AB 3-0 SH 27 (SUTURE) ×1
SUT VIC AB 3-0 SH 27X BRD (SUTURE) ×2 IMPLANT
SYR BULB 3OZ (MISCELLANEOUS) IMPLANT
SYR BULB IRRIGATION 50ML (SYRINGE) IMPLANT
SYR CONTROL 10ML LL (SYRINGE) IMPLANT
TOWEL OR 17X24 6PK STRL BLUE (TOWEL DISPOSABLE) ×3 IMPLANT
TOWEL OR 17X26 10 PK STRL BLUE (TOWEL DISPOSABLE) ×6 IMPLANT
TRAY FOLEY W/METER SILVER 16FR (SET/KITS/TRAYS/PACK) ×3 IMPLANT
TRAY PROCTOSCOPIC FIBER OPTIC (SET/KITS/TRAYS/PACK) ×3 IMPLANT
TUBE CONNECTING 12X1/4 (SUCTIONS) ×3 IMPLANT
UNDERPAD 30X30 (UNDERPADS AND DIAPERS) ×6 IMPLANT
YANKAUER SUCT BULB TIP NO VENT (SUCTIONS) ×3 IMPLANT

## 2017-02-07 NOTE — Anesthesia Postprocedure Evaluation (Signed)
Anesthesia Post Note  Patient: Terry Wu  Procedure(s) Performed: Procedure(s) (LRB): SIGMOID COLECTOMY WITH COLOSTOMY (N/A) HERNIA REPAIR UMBILICAL ADULT (N/A)     Patient location during evaluation: SICU Anesthesia Type: General Level of consciousness: sedated Pain management: pain level controlled Vital Signs Assessment: post-procedure vital signs reviewed and stable Respiratory status: patient remains intubated per anesthesia plan Cardiovascular status: stable Anesthetic complications: no    Last Vitals:  Vitals:   02/07/17 1153 02/07/17 1750  BP: (!) 111/53   Pulse: 73 67  Resp: 20 15  Temp: 36.9 C   SpO2: 98% 95%    Last Pain:  Vitals:   02/07/17 1153  TempSrc: Oral                 Maressa Apollo,W. EDMOND

## 2017-02-07 NOTE — Transfer of Care (Signed)
Immediate Anesthesia Transfer of Care Note  Patient: Terry Wu  Procedure(s) Performed: Procedure(s): SIGMOID COLECTOMY WITH COLOSTOMY (N/A) HERNIA REPAIR UMBILICAL ADULT (N/A)  Patient Location: ICU  Anesthesia Type:General  Level of Consciousness: sedated and Patient remains intubated per anesthesia plan  Airway & Oxygen Therapy: Patient remains intubated per anesthesia plan and Patient placed on Ventilator (see vital sign flow sheet for setting)  Post-op Assessment: Report given to RN  Post vital signs: Reviewed and unstable  Last Vitals:  Vitals:   02/07/17 1153 02/07/17 1750  BP: (!) 111/53   Pulse: 73 67  Resp: 20 15  Temp: 36.9 C   SpO2: 98% 95%    Last Pain:  Vitals:   02/07/17 1153  TempSrc: Oral         Complications: No apparent anesthesia complications

## 2017-02-07 NOTE — Anesthesia Procedure Notes (Signed)
Arterial Line Insertion Start/End8/16/2018 2:40 PM, 02/07/2017 2:46 PM Performed by: Mariea Clonts, CRNA  Preanesthetic checklist: patient identified, IV checked, site marked, risks and benefits discussed, surgical consent, monitors and equipment checked, pre-op evaluation and timeout performed Lidocaine 1% used for infiltration Right, radial was placed Catheter size: 20 G Hand hygiene performed  and maximum sterile barriers used   Attempts: 1 Procedure performed without using ultrasound guided technique. Following insertion, dressing applied and Biopatch. Post procedure assessment: normal  Patient tolerated the procedure well with no immediate complications.

## 2017-02-07 NOTE — Anesthesia Preprocedure Evaluation (Addendum)
Anesthesia Evaluation  Patient identified by MRN, date of birth, ID band Patient awake    Reviewed: Allergy & Precautions, NPO status , Patient's Chart, lab work & pertinent test results, reviewed documented beta blocker date and time   Airway Mallampati: II  TM Distance: >3 FB Neck ROM: Full    Dental no notable dental hx. (+) Teeth Intact, Dental Advisory Given, Poor Dentition, Partial Upper, Partial Lower, Missing   Pulmonary COPD,  oxygen dependent,    Pulmonary exam normal breath sounds clear to auscultation       Cardiovascular hypertension, Pt. on medications and Pt. on home beta blockers + CAD  Normal cardiovascular exam+ dysrhythmias Atrial Fibrillation  Rhythm:Regular Rate:Normal  ECG: A-fib, rate 66  Preoperative Assessment: Clear to have colon surgery. Cardiac symptoms stable, AVR working normally , EF normal. Biggest issue is age, functional status and COPD with some need for pulmonary toilet. I suspect he will also have some delirium post op given current MS  ECHO: - Mild LVH with LVEF 60-65%, indeterminate diastolic function in the setting of atrial fibrillation. Moderate to severe biatrial enlargement. MAC with mildly thickened mitral leaflets and trivial mitral regurgitation. Bioprosthetic aortic valve shows grossly normal function as detailed above. Trivial tricuspid regurgitation with evidence of moderate pulmonary hypertension, PASP estimated 51 mmHg.   Neuro/Psych negative neurological ROS  negative psych ROS   GI/Hepatic negative GI ROS, Neg liver ROS, GERD  Medicated and Controlled,  Endo/Other  diabetes, Type 2, Oral Hypoglycemic Agents  Renal/GU negative Renal ROS     Musculoskeletal negative musculoskeletal ROS (+)   Abdominal (+) + obese,   Peds  Hematology  (+) anemia ,   Anesthesia Other Findings hyperlipidemia  Reproductive/Obstetrics                           Anesthesia Physical Anesthesia Plan  ASA: III  Anesthesia Plan: General   Post-op Pain Management:    Induction: Intravenous  PONV Risk Score and Plan: 2 and Ondansetron, Dexamethasone and Treatment may vary due to age or medical condition  Airway Management Planned: Oral ETT  Additional Equipment: Arterial line  Intra-op Plan:   Post-operative Plan: Extubation in OR and Possible Post-op intubation/ventilation  Informed Consent: I have reviewed the patients History and Physical, chart, labs and discussed the procedure including the risks, benefits and alternatives for the proposed anesthesia with the patient or authorized representative who has indicated his/her understanding and acceptance.   Dental advisory given  Plan Discussed with: CRNA  Anesthesia Plan Comments:        Anesthesia Quick Evaluation

## 2017-02-07 NOTE — Anesthesia Procedure Notes (Signed)
Procedure Name: Intubation Date/Time: 02/07/2017 3:18 PM Performed by: Sampson Si E Pre-anesthesia Checklist: Patient identified, Emergency Drugs available, Suction available and Patient being monitored Patient Re-evaluated:Patient Re-evaluated prior to induction Oxygen Delivery Method: Circle System Utilized Preoxygenation: Pre-oxygenation with 100% oxygen Induction Type: IV induction and Rapid sequence Ventilation: Mask ventilation without difficulty and Oral airway inserted - appropriate to patient size Laryngoscope Size: 4, Glidescope and Mac Grade View: Grade I Tube type: Subglottic suction tube Tube size: 7.5 mm Number of attempts: 1 Airway Equipment and Method: Stylet,  Oral airway and Video-laryngoscopy Placement Confirmation: ETT inserted through vocal cords under direct vision,  positive ETCO2 and breath sounds checked- equal and bilateral Secured at: 23 cm Tube secured with: Tape Dental Injury: Teeth and Oropharynx as per pre-operative assessment and Injury to lip  Difficulty Due To: Difficulty was anticipated and Difficult Airway- due to anterior larynx Future Recommendations: Recommend- induction with short-acting agent, and alternative techniques readily available Comments: CRNA DL x 2 with Mac 4 Grade III and II view, unable to pass tip of ETT thru cords. MDA DL x 1 with Mac 4 Grade II view, ETT exited trach when stilette pulled back. Attempt #4 MDA used Glidescope, Grade I view.

## 2017-02-07 NOTE — Progress Notes (Signed)
PROGRESS NOTE    BRANCH PACITTI  MVE:[id number] DOB: December 22, 1931 DOA: 01/30/2017 PCP: Sinda Du, MD   Brief Narrative: Terry Wu 83 y.o. history of CABG/AVR in 2006 Had a normal myovue in 2012. Echo 08/23/15 with normal EF 60-65%, comes in for non obstructive colonic mass, possibly cancerous, was on Dr Luan Pulling service at [facility] , transferred to [facility] for surgical consultation and for possible sigmoidectomy.   Assessment & Plan:   Active Problems:   HYPERTENSION, BENIGN   S/P AVR   CORONARY ARTERY BYPASS GRAFT, HX OF   Hyponatremia   COPD (chronic obstructive pulmonary disease) (HCC)   Bilateral leg edema   Diarrhea   Chronic diastolic heart failure (HCC)   Dehydration   Acute kidney injury ([place])   Preoperative cardiovascular examination   Chronic diarrhea,/ mass in the colon:  Gi Consulted and he underwent sigmoidoscopy showing the mass.  He was transferred to [facility] for sigmoidectomy. Surgery consulted and plan for OR today.     Hypertension:  Controlled.    CAD:  S/P CABG.  Cardiology consulted for pre op clearance and he is cleared for surgery.    Chronic diastolic heart failure" Appears euvolemic.    AKI:   Possibly from dehydration from diarrhea, creatinine back to baseline today.   COPD:  No wheezing.    DVT prophylaxis: scd's Code Status: (Full) Family Communication: none at bedside.  Disposition Plan: pending surgery.    Consultants:   Cardiology  Surgery.   GI.   Procedures:SIGMOIDOSCOPY.   Antimicrobials:NONE.   Subjective: No new complaints.   Objective: Vitals:   02/07/17 0614 02/07/17 0855 02/07/17 0922 02/07/17 1153  BP: 114/71 126/60  (!) 111/53  Pulse: 69 70 76 73  Resp: 18 20 18 20   Temp: 98.1 F (36.7 C)   98.4 F (36.9 C)  TempSrc: Oral   Oral  SpO2: 95% 99% 97% 98%  Weight: 113 kg (249 lb 3.2 oz)     Height:        Intake/Output Summary (Last 24 hours) at 02/07/17 1324 Last data filed at 02/07/17 0919  Gross per 24 hour  Intake              320 ml  Output              300 ml  Net               20 ml   Filed Weights   02/05/17 0600 02/06/17 0620 02/07/17 0614  Weight: 114 kg (251 lb 4.8 oz) 112.9 kg (248 lb 14.4 oz) 113 kg (249 lb 3.2 oz)    Examination:  General exam: Appears calm and comfortable  Respiratory system: Clear to auscultation. Respiratory effort normal. Cardiovascular system: S1 & S2 heard, RRR. No JVD, murmurs, rubs, gallops or clicks. No pedal edema. Gastrointestinal system: Abdomen is nondistended, soft and nontender. No organomegaly or masses felt. Normal bowel sounds heard. Central nervous system: Alert and oriented. No focal neurological deficits. Extremities: Symmetric 5 x 5 power. Skin: No rashes, lesions or ulcers Psychiatry: Judgement and insight appear normal. Mood & affect appropriate.     Data Reviewed: I have personally reviewed following labs and imaging studies  CBC:  Recent Labs Lab 02/02/17 0600  02/03/17 0629 02/04/17 0638 02/04/17 1540 02/05/17 0607 02/06/17 0627  WBC 8.1  --  6.0 6.6 7.4 6.4 13.8*  NEUTROABS 5.9  --  3.6 4.3 5.1  --  11.0*  HGB 9.4*  < >  9.5* 9.6* 10.0* 10.0* 10.2*  HCT 27.9*  < > 27.7* 28.5* 29.5* 29.9* 30.8*  MCV 95.2  --  95.5 96.3 95.5 95.5 98.1  PLT 164  --  155 163 183 179 191  < > = values in this interval not displayed. Basic Metabolic Panel:  Recent Labs Lab 02/01/17 0544 02/02/17 0600 02/03/17 0629 02/04/17 0638 02/06/17 0627  NA 130* 133* 130* 133* 134*  K 4.9 4.8 4.3 5.0 4.5  CL 98* 99* 98* 97* 98*  CO2 27 27 25 28 29   GLUCOSE 109* 116* 95 133* 135*  BUN 33* 33* 32* 34* 15  CREATININE 1.16 1.44* 1.34* 1.42* 1.03  CALCIUM 7.9* 8.1* 7.9* 8.0* 8.5*   GFR: Estimated Creatinine Clearance: 67.3 mL/min (by C-G formula based on SCr of 1.03 mg/dL). Liver Function Tests: No results for input(s): AST, ALT, ALKPHOS, BILITOT, PROT, ALBUMIN in the last 168 hours. No results for input(s): LIPASE, AMYLASE  in the last 168 hours. No results for input(s): AMMONIA in the last 168 hours. Coagulation Profile:  Recent Labs Lab 02/03/17 0629 02/04/17 2703 02/05/17 0607 02/06/17 0627 02/07/17 0506  INR 2.24 2.08 1.45 1.37 1.35   Cardiac Enzymes: No results for input(s): CKTOTAL, CKMB, CKMBINDEX, TROPONINI in the last 168 hours. BNP (last 3 results) No results for input(s): PROBNP in the last 8760 hours. HbA1C: No results for input(s): HGBA1C in the last 72 hours. CBG:  Recent Labs Lab 02/06/17 0842 02/06/17 1108 02/06/17 1621 02/06/17 2111 02/07/17 1125  GLUCAP 200* 160* 134* 188* 98   Lipid Profile: No results for input(s): CHOL, HDL, LDLCALC, TRIG, CHOLHDL, LDLDIRECT in the last 72 hours. Thyroid Function Tests: No results for input(s): TSH, T4TOTAL, FREET4, T3FREE, THYROIDAB in the last 72 hours. Anemia Panel: No results for input(s): VITAMINB12, FOLATE, FERRITIN, TIBC, IRON, RETICCTPCT in the last 72 hours. Sepsis Labs: No results for input(s): PROCALCITON, LATICACIDVEN in the last 168 hours.  Recent Results (from the past 240 hour(s))  C difficile quick scan w PCR reflex     Status: None   Collection Time: 01/30/17  6:30 PM  Result Value Ref Range Status   C Diff antigen NEGATIVE NEGATIVE Final   C Diff toxin NEGATIVE NEGATIVE Final   C Diff interpretation No C. difficile detected.  Final  Gastrointestinal Panel by PCR , Stool     Status: None   Collection Time: 01/31/17 12:44 PM  Result Value Ref Range Status   Campylobacter species NOT DETECTED NOT DETECTED Final   Plesimonas shigelloides NOT DETECTED NOT DETECTED Final   Salmonella species NOT DETECTED NOT DETECTED Final   Yersinia enterocolitica NOT DETECTED NOT DETECTED Final   Vibrio species NOT DETECTED NOT DETECTED Final   Vibrio cholerae NOT DETECTED NOT DETECTED Final   Enteroaggregative E coli (EAEC) NOT DETECTED NOT DETECTED Final   Enteropathogenic E coli (EPEC) NOT DETECTED NOT DETECTED Final    Enterotoxigenic E coli (ETEC) NOT DETECTED NOT DETECTED Final   Shiga like toxin producing E coli (STEC) NOT DETECTED NOT DETECTED Final   Shigella/Enteroinvasive E coli (EIEC) NOT DETECTED NOT DETECTED Final   Cryptosporidium NOT DETECTED NOT DETECTED Final   Cyclospora cayetanensis NOT DETECTED NOT DETECTED Final   Entamoeba histolytica NOT DETECTED NOT DETECTED Final   Giardia lamblia NOT DETECTED NOT DETECTED Final   Adenovirus F40/41 NOT DETECTED NOT DETECTED Final   Astrovirus NOT DETECTED NOT DETECTED Final   Norovirus GI/GII NOT DETECTED NOT DETECTED Final   Rotavirus A NOT DETECTED  NOT DETECTED Final   Sapovirus (I, II, IV, and V) NOT DETECTED NOT DETECTED Final  Urine Culture     Status: None   Collection Time: 02/04/17  8:23 AM  Result Value Ref Range Status   Specimen Description URINE, CLEAN CATCH  Final   Special Requests NONE  Final   Culture   Final    NO GROWTH Performed at [facility], [address] [place], [place] [address]    Report Status 02/05/2017 FINAL  Final         Radiology Studies: No results found.      Scheduled Meds: . alvimopan  12 mg Oral On Call to OR  . atorvastatin  20 mg Oral Daily  . carvedilol  12.5 mg Oral BID  . dorzolamide-timolol  1 drop Both Eyes BID  . furosemide  40 mg Intravenous Q12H  . insulin aspart  0-15 Units Subcutaneous TID WC  . insulin aspart  0-5 Units Subcutaneous QHS  . levalbuterol  0.63 mg Nebulization BID  . multivitamin  1 tablet Oral TID PC  . pantoprazole  40 mg Oral Daily  . polyethylene glycol  17 g Oral Daily  . spironolactone  25 mg Oral Daily   Continuous Infusions:   LOS: 8 days    Time spent: 35 minutes    Taten Merrow, MD [facility] Pager [phone]  If 7PM-7AM, please contact night-coverage [email or web address] Password [email or web address] 02/07/2017, 1:24 PM

## 2017-02-07 NOTE — Progress Notes (Signed)
2 Days Post-Op   Subjective/Chief Complaint: CC:  No abdominal pain this morning Several BM last night  Marked by WOCN for possible colostomy Was cleared by cardiology yesterday   Objective: Vital signs in last 24 hours: Temp:  [98 F (36.7 C)-98.5 F (36.9 C)] 98.1 F (36.7 C) (08/16 0614) Pulse Rate:  [57-82] 69 (08/16 0614) Resp:  [18-20] 18 (08/16 0614) BP: (95-132)/(49-71) 114/71 (08/16 0614) SpO2:  [95 %-99 %] 95 % (08/16 0614) Weight:  [113 kg (249 lb 3.2 oz)] 113 kg (249 lb 3.2 oz) (08/16 0614) Last BM Date: 02/06/17  Intake/Output from previous day: 08/15 0701 - 08/16 0700 In: 560 [P.O.:560] Out: 300 [Urine:300] Intake/Output this shift: No intake/output data recorded. Obese elderly male in NAD Eyes:  Pupils equal, round; sclera anicteric Lungs:  Bilateral rhonchi; no wheezing; becomes dyspneic when bed is placed in supine position CV:  Regular rate and rhythm; systolic ejection murmur; extremities well-perfused with no edema Abd:  +bowel sounds, soft, obese, non-tender; large protruding 8 cm umbilical hernia Skin:  Warm, dry; no sign of jaundice Psychiatric - alert and oriented x 4; calm mood and affect  Lab Results:   Recent Labs  02/05/17 0607 02/06/17 0627  WBC 6.4 13.8*  HGB 10.0* 10.2*  HCT 29.9* 30.8*  PLT 179 191   BMET  Recent Labs  02/06/17 0627  NA 134*  K 4.5  CL 98*  CO2 29  GLUCOSE 135*  BUN 15  CREATININE 1.03  CALCIUM 8.5*   PT/INR  Recent Labs  02/06/17 0627 02/07/17 0506  LABPROT 17.0* 16.7*  INR 1.37 1.35   ABG No results for input(s): PHART, HCO3 in the last 72 hours.  Invalid input(s): PCO2, PO2  Studies/Results: No results found.  Anti-infectives: Anti-infectives    None      Assessment/Plan: 1.  Large sigmoid (rectosigmoid junction) colon cancer 2.  Rectal tubulovillous adenoma/ high grade dysplasia - addressed by colonoscopy 3.  Cholelithiasis - probably asymptomatic 4.  Large chronically  incarcerated umbilical hernia 5.  Hx of prostate cancer with radiation seeds  Plan sigmoid colectomy/ possible colostomy/ repair of umbilical hernia/ possible cholecystectomy today.  Extensive discussion with patient and family yesterday.  May need step-down or ICU post-op, as well as Cardiology consultation.     LOS: 8 days    Liron Eissler K. 02/07/2017

## 2017-02-07 NOTE — Consult Note (Signed)
PULMONARY / CRITICAL CARE MEDICINE   Name: Terry Wu MRN: [id number] DOB: Dec 04, 1931    ADMISSION DATE:  01/30/2017 CONSULTATION DATE:  02/07/2017  REFERRING MD:  Dr. Karleen Hampshire  CHIEF COMPLAINT:  Post op resp failure  HISTORY OF PRESENT ILLNESS: Patient is encephalopathic and/or intubated. Therefore history has been obtained from chart review.  83 year old male with PMH as below, which is significant for atrial fibrillation, CAD (CABG/AVR in 2006), DM, HTN, and aortic aneurysm. He was admitted to [facility] 8/8 with intractable diarrhea and dehydration. He was having more than 12 stools per day. He underwent GI workup including colonoscopy, which discovered a 4.4cm ulcerated friable mass and pathology deemed this to be adenocarcinoma. Of note he also has a large protruding umbilical hernia and large gallstones. He was transferred to [facility] 8/15 and underwent sigmoid colectomy with creation of descending colostomy on 8/16. Postoperatively he was recovered to the medical ICU and remained on ventilator. PCCM asked to assist with ICU care.    PAST MEDICAL HISTORY :  He  has a past medical history of Aortic aneurysm ([place]); Aortic regurgitation; Atrial fibrillation with controlled ventricular response (HCC) (08/22/2015); CAD (coronary artery disease); Complication of anesthesia; Esophageal reflux; Essential hypertension; History of dizziness; Mixed hyperlipidemia; and Type 2 diabetes mellitus ([place]).  PAST SURGICAL HISTORY: He  has a past surgical history that includes Coronary artery bypass graft (2006); Aortic valve replacement (2006); Total knee arthroplasty (Bilateral); Hernia repair; and Colonoscopy (N/A, 02/05/2017).  Allergies  Allergen Reactions  . Aspirin     Unknown, per pt  . Demerol     Unknown, per pt    No current facility-administered medications on file prior to encounter.    Current Outpatient Prescriptions on File Prior to Encounter  Medication Sig  . amLODipine  (NORVASC) 10 MG tablet TAKE ONE TABLET BY MOUTH ONCE DAILY  . amoxicillin (AMOXIL) 500 MG capsule Take 2,000 mg by mouth as directed.   [name] atorvastatin (LIPITOR) 20 MG tablet TAKE ONE TABLET BY MOUTH ONCE DAILY  . carvedilol (COREG) 12.5 MG tablet TAKE ONE TABLET BY MOUTH TWICE DAILY  . Coenzyme Q10 (CO Q 10) 100 MG CAPS Take 1 capsule by mouth every morning.  . dorzolamide-timolol (COSOPT) 22.3-6.8 MG/ML ophthalmic solution Place 1 drop into both eyes 2 (two) times daily.  [name] glimepiride (AMARYL) 2 MG tablet Take 2 mg by mouth daily before breakfast.   . magnesium oxide (MAG-OX) 400 MG tablet Take 400 mg by mouth daily.  . Multiple Vitamins-Minerals (ICAPS PO) Take 2 tablets by mouth 3 (three) times daily after meals.   . pantoprazole (PROTONIX) 40 MG tablet Take 1 tablet (40 mg total) by mouth daily.  . potassium chloride SA (K-DUR,KLOR-CON) 20 MEQ tablet Take 20 mEq by mouth daily.   [name] spironolactone (ALDACTONE) 25 MG tablet TAKE ONE TABLET BY MOUTH ONCE DAILY  . telmisartan (MICARDIS) 40 MG tablet Take 40 mg by mouth daily.  [name] torsemide (DEMADEX) 100 MG tablet Take 50 mg by mouth daily.   [name] warfarin (COUMADIN) 5 MG tablet Take 1 tablet daily except 1/2 tablet on Mondays, Wednesdays and Fridays (Patient taking differently: Take 1 tablet daily except 1/2 tablet on sundays, Tuesday, thursday and saturdays)    FAMILY HISTORY:  His indicated that his mother is deceased. He indicated that his father is deceased. He indicated that the status of his unknown relative is unknown.    SOCIAL HISTORY: He  reports that he has never  smoked. He has never used smokeless tobacco. He reports that he does not drink alcohol or use drugs.  REVIEW OF SYSTEMS:   Unable as patient is encephalopathic   SUBJECTIVE:    VITAL SIGNS: BP (!) 111/53 (BP Location: Right Arm)   Pulse 67   Temp 98.4 F (36.9 C) (Oral)   Resp 15   Ht 5\' 9"  (1.753 m)   Wt 113 kg (249 lb 3.2 oz) Comment: bed scaqle  SpO2 95%    BMI 36.80 kg/m   HEMODYNAMICS:    VENTILATOR SETTINGS: Vent Mode: PRVC FiO2 (%):  [36 %-40 %] 40 % Set Rate:  [14 bmp] 14 bmp Vt Set:  [560 mL] 560 mL PEEP:  [5 cmH20] 5 cmH20 Plateau Pressure:  [27 cmH20] 27 cmH20  INTAKE / OUTPUT: I/O last 3 completed shifts: In: 15 [P.O.:560] Out: 650 [Urine:650]  PHYSICAL EXAMINATION: General:  Elderly male in NAD on vent Neuro:  Sedated HEENT:  [place]/AT, PERRL, no JVD Cardiovascular:  IRIR rate controlled Lungs:  Clear vent assisted breaths.  Abdomen:  Midline incision with closure device in place. Colostomy beefy red. Soft. Hypoactive Musculoskeletal:  No acute deformity or ROM limitaiton Skin:  Grossly intact  LABS:  BMET  Recent Labs Lab 02/03/17 0629 02/04/17 0638 02/06/17 0627 02/07/17 1631  NA 130* 133* 134* 134*  K 4.3 5.0 4.5 4.3  CL 98* 97* 98*  --   CO2 25 28 29   --   BUN 32* 34* 15  --   CREATININE 1.34* 1.42* 1.03  --   GLUCOSE 95 133* 135*  --     Electrolytes  Recent Labs Lab 02/03/17 0629 02/04/17 0638 02/06/17 0627  CALCIUM 7.9* 8.0* 8.5*    CBC  Recent Labs Lab 02/04/17 1540 02/05/17 0607 02/06/17 0627 02/07/17 1631  WBC 7.4 6.4 13.8*  --   HGB 10.0* 10.0* 10.2* 8.2*  HCT 29.5* 29.9* 30.8* 24.0*  PLT 183 179 191  --     Coag's  Recent Labs Lab 02/05/17 0607 02/06/17 0627 02/07/17 0506  INR 1.45 1.37 1.35    Sepsis Markers No results for input(s): LATICACIDVEN, PROCALCITON, O2SATVEN in the last 168 hours.  ABG  Recent Labs Lab 02/07/17 1631  PHART 7.419  PCO2ART 44.0  PO2ART 98.0    Liver Enzymes No results for input(s): AST, ALT, ALKPHOS, BILITOT, ALBUMIN in the last 168 hours.  Cardiac Enzymes No results for input(s): TROPONINI, PROBNP in the last 168 hours.  Glucose  Recent Labs Lab 02/06/17 0842 02/06/17 1108 02/06/17 1621 02/06/17 2111 02/07/17 1125 02/07/17 1402  GLUCAP 200* 160* 134* 188* 98 91    Imaging No results found.   STUDIES:  8/8  CT abd > Umbilical and BILATERAL inguinal hernias containing fat. Increased stool in colon. Small nonobstructing LEFT renal calculus. Cholelithiasis without definite CT evidence of cholecystitis though if this is a clinical concern recommend ultrasound follow-up. Extensive calcified atherosclerotic plaques of the abdominal aorta including the origins of the abdominal visceral vessels. Colonoscopy 8/14 > A fungating and ulcerated non-obstructing large mass was found in the distal sigmoid colon. The mass was partially circumferential (involving one-third of the lumen circumference). The mass measured four cm in length. In addition, its diameter measured four mm. No bleeding was present. One 15 mm polyp in the rectum, removed. One 17 mm polyp in the rectum, removed.  CULTURES:   ANTIBIOTICS: Cefotetan periop  SIGNIFICANT EVENTS: 8/8 admit 8/15 transfer to [facility] 8/16 colectomy with colostomy  LINES/TUBES: ETT  8/16 >  DISCUSSION:   ASSESSMENT / PLAN:  PULMONARY A: Acute respiratory failure in post-operative setting.   P:   Full vent support ABG CXR VAP prevention bundle  CARDIOVASCULAR A:  Atrial fibrillation (on warfarin) HTN H/o CAD, CABG, AVR  P:  Telemetry MAP goal > 53mmHg Holding home antihypertensives Holding warfarin  RENAL A:   No acute issues  P:   Follow BMP Strict I&O  GASTROINTESTINAL A:   R Rectosigmoid colon mass S/p colectomy with new colostomy Gallbladder stones  P:   Surgery following Wound care following NPO Protonix for SUP  HEMATOLOGIC A:   Anemia Adenocarcinoma of bowel  P:  Follow CBC Will need workup to assess for metastases  SCDs  INFECTIOUS A:   Periop ABX  P:   Hold further ABX for now  ENDOCRINE A:   DM  P:   CBG monitoring every 4 hours and SSI as indicated  NEUROLOGIC A:   Acute encephalopathy due to medical sedation.   P:   RASS goal: -1 to -2 Precedex infusion PRN fentanyl PRN  versed   FAMILY  - Updates:   - Inter-disciplinary family meet or Palliative Care meeting due by:  8/22   Georgann Housekeeper, AGACNP-BC [facility] Pager [phone] or [phone]  02/07/2017 6:29 PM

## 2017-02-07 NOTE — Progress Notes (Addendum)
eLink Physician-Brief Progress Note Patient Name: Terry Wu DOB: 04/15/1932 MRN: [id number]   Date of Service  02/07/2017  HPI/Events of Note  H/O afib, AVR, COPD, colon adenoCa S/p sigmoid colectomy, colostomy Chart reviewed Stable on cam check  eICU Interventions  Vent, sedation orders entered. Protonix, SCDs Check CXR, ABG     Intervention Category Evaluation Type: New Patient Evaluation  Terry Wu 02/07/2017, 5:56 PM

## 2017-02-07 NOTE — Progress Notes (Signed)
CRITICAL VALUE ALERT  Critical Value:  Troponin 0.07  Date & Time Notied:  02/07/17 2124  Provider Notified: Warren Lacy

## 2017-02-07 NOTE — Op Note (Addendum)
Preop diagnosis: Adenocarcinoma of the rectosigmoid junction   Incarcerated umbilical hernia Postop diagnosis: Same Procedure performed: Sigmoid colectomy with creation of descending colostomy Repair of umbilical hernia Surgeon:Khaden Gater K. Asst.: Dr. Nedra Hai Anesthesia: Gen. endotracheal Indications: This is an 83 year old male with multiple medical comorbidities who is transferred to this hospital from [facility] with a new diagnosis of adenocarcinoma of the rectosigmoid junction. The patient has multiple cardiac comorbidities and cardiology felt that he should be transferred for her surgery. The patient has 2 small polyps that were removed endoscopically that are distal to the primary cancer. In discussion with GI, these were felt to be completely resected by colonoscopy snare.  The patient has a large chronically incarcerated umbilical hernia. He also has several large gallstones.  He has radiation seeds in his prostate gland. Our plan was to perform sigmoid colectomy and evaluate for possible anastomosis versus descending colostomy. We will also repair his umbilical hernia at the time of surgery. We will not use mesh. Patient is doing well and the incision easily allows it, we would consider a cholecystectomy.  Description of procedure:  The patient is brought to the operating room and placed in supine position on the operating room table. After an adequate level of general anesthesia was obtained, a Foley catheter was placed under sterile technique. The patient's legs were placed in lithotomy position in yellowfin stirrups. His perineum was prepped with Betadine and his abdomen was prepped with ChloraPrep and draped sterile fashion. A timeout was taken to ensure the proper patient and proper procedure. We made a vertical midline incision with an elliptical incision around the large umbilical hernia. We dissected into the hernia sac which seem to contain only some incarcerated omentum.  We dissected the omentum free and excised hernia sac and overlying skin. We open the fascia along the length of the incision. We into the peritoneal cavity. There is no free fluid in the abdomen. The patient has and a normocytic amount of adipose tissue in omentum and mesentery. We used a Bookwalter retractor for exposure. We packed the small bowel when the upper part of the abdomen. We identified the descending and sigmoid colon. I could reached the pelvis and palpate the cancer deep within the pelvis just above the peritoneal reflection. Begin mobilizing the sigmoid colon away from the lateral abdominal wall. There is significant redundancy to the sigmoid colon. We divided the proximal sigmoid colon with a GIA-75 stapler. The mesentery was divided with the LigaSure device. We continued dissecting down to the pelvis. Exposure was quite difficult because of the depth of this abdomen and pelvis and the surrounding adipose tissue. We were able to dissect a few centimeters distal to the palpable mass. I was able to dissect around the colon wall. We divided this with a green Contour stapler. We then finished dividing the miso rectum and distal sigmoid mesentery with the LigaSure device. The specimen was removed and oriented with a suture at the distal margin. This was sent for pathologic examination. We irrigated the pelvis and inspected for hemostasis. We had divided just above the peritoneal reflection and the distal rectal stump was quite short and adherent to the prostate.  We felt that it would not be safe to perform an anastomosis. We then inspected the entire abdomen and irrigated thoroughly. We brought out a descending colostomy on the left side. This area been previously marked by the ostomy nurse. We excised circle of skin and dissected down to the subcutaneous fat to the fascia.  We dissected into the peritoneal cavity and then exteriorized the descending colon and staple line. A small bleeding vessels  subcutaneous tissues was ligated with 3-0 silk.     I then examined the upper abdomen.  I could not reach the gallbladder from our incision because the patient has a very long torso.  We made the decision not to remove the gallbladder as he probably would still be a candidate for laparoscopic cholecystectomy.  We then closed the fascia with double-stranded  #1 PDS suture including the umbilical hernia defect with an our closure. The subcutaneous tissues were irrigated and inspected for hemostasis. Staples were used to close the skin. A honeycomb dressing was applied.  We then matured the colostomy by excising the staple line with cautery. I matured the colostomy edges with 3-0 Vicryl sutures. An ostomy appliance was cut to fit the ostomy. The patient has significant pulmonary comorbidities and will be left intubated at least overnight. He will be transported directly to the intensive care unit.  All sponge, instrument, and needle counts are correct.  Imogene Burn. Georgette Dover, MD, [facility] Surgery  General/ Trauma Surgery  02/07/2017 5:30 PM

## 2017-02-07 NOTE — Consult Note (Addendum)
[facility] Nurse requested for preoperative stoma site marking  Discussed surgical procedure and stoma creation with patient.  Explained role of the [place] nurse team.  Provided the patient with educational booklet/DVD and provided samples of pouching options.  Answered patient questions.   Examined patient lying and sitting upright in the bed.  Positioning was extremely limited to related to pain and pt has a very tight and swollen abd and hernia protruding from umbilicus area. Attempted to place the marking in the patient's visual field, away from any creases or abdominal contour issues and within the rectus muscle, but it is difficult to determine if creases will appear later when pt is not distended.    Marked for colostomy in the LLQ  _9___ cm to the left of the umbilicus and _0___NU above the umbilicus.  Patient's abdomen cleansed with CHG wipes at site markings, allowed to air dry prior to marking.Plans to go to the OR this am. No family in the room. [place] Nurse team will follow up with patient after surgery for continue ostomy care and teaching.  Julien Girt MSN, RN, [place], [place], [place]

## 2017-02-07 NOTE — Progress Notes (Signed)
PT Cancellation Note  Patient Details Name: Terry Wu MRN: [id number] DOB: 05/17/1932   Cancelled Treatment:    Reason Eval/Treat Not Completed: Patient at procedure or test/unavailable. Pt going for a surgical procedure. PT will check back as time allows.    Scheryl Marten PT, DPT  [phone]  02/07/2017, 2:20 PM

## 2017-02-08 ENCOUNTER — Inpatient Hospital Stay (HOSPITAL_COMMUNITY): Payer: Medicare Other

## 2017-02-08 ENCOUNTER — Encounter (HOSPITAL_COMMUNITY): Payer: Self-pay | Admitting: Surgery

## 2017-02-08 DIAGNOSIS — I361 Nonrheumatic tricuspid (valve) insufficiency: Secondary | ICD-10-CM

## 2017-02-08 DIAGNOSIS — J9600 Acute respiratory failure, unspecified whether with hypoxia or hypercapnia: Secondary | ICD-10-CM

## 2017-02-08 DIAGNOSIS — J9601 Acute respiratory failure with hypoxia: Secondary | ICD-10-CM

## 2017-02-08 DIAGNOSIS — Z09 Encounter for follow-up examination after completed treatment for conditions other than malignant neoplasm: Secondary | ICD-10-CM

## 2017-02-08 DIAGNOSIS — R0602 Shortness of breath: Secondary | ICD-10-CM

## 2017-02-08 LAB — GLUCOSE, CAPILLARY
GLUCOSE-CAPILLARY: 114 mg/dL — AB (ref 65–99)
GLUCOSE-CAPILLARY: 128 mg/dL — AB (ref 65–99)
GLUCOSE-CAPILLARY: 139 mg/dL — AB (ref 65–99)
Glucose-Capillary: 134 mg/dL — ABNORMAL HIGH (ref 65–99)
Glucose-Capillary: 141 mg/dL — ABNORMAL HIGH (ref 65–99)
Glucose-Capillary: 139 mg/dL — ABNORMAL HIGH (ref 65–99)

## 2017-02-08 LAB — ECHOCARDIOGRAM COMPLETE
Height: 69 in
Weight: 3987.2 oz

## 2017-02-08 LAB — COMPREHENSIVE METABOLIC PANEL
ALK PHOS: 31 U/L — AB (ref 38–126)
ALT: 23 U/L (ref 17–63)
AST: 18 U/L (ref 15–41)
Albumin: 2.6 g/dL — ABNORMAL LOW (ref 3.5–5.0)
Anion gap: 9 (ref 5–15)
BUN: 18 mg/dL (ref 6–20)
CALCIUM: 8.5 mg/dL — AB (ref 8.9–10.3)
CHLORIDE: 99 mmol/L — AB (ref 101–111)
CO2: 26 mmol/L (ref 22–32)
CREATININE: 1.26 mg/dL — AB (ref 0.61–1.24)
GFR calc Af Amer: 59 mL/min — ABNORMAL LOW (ref >60)
GFR, EST NON AFRICAN AMERICAN: 51 mL/min — AB (ref >60)
Glucose, Bld: 142 mg/dL — ABNORMAL HIGH (ref 65–99)
Potassium: 4.6 mmol/L (ref 3.5–5.1)
Sodium: 134 mmol/L — ABNORMAL LOW (ref 135–145)
Total Bilirubin: 1.2 mg/dL (ref 0.3–1.2)
Total Protein: 6 g/dL — ABNORMAL LOW (ref 6.5–8.1)

## 2017-02-08 LAB — CBC
HEMATOCRIT: 28.5 % — AB (ref 39.0–52.0)
HEMOGLOBIN: 9.4 g/dL — AB (ref 13.0–17.0)
MCH: 31.4 pg (ref 26.0–34.0)
MCHC: 33 g/dL (ref 30.0–36.0)
MCV: 95.3 fL (ref 78.0–100.0)
PLATELETS: 140 10*3/uL — AB (ref 150–400)
RBC: 2.99 MIL/uL — AB (ref 4.22–5.81)
RDW: 13.5 % (ref 11.5–15.5)
WBC: 13.2 10*3/uL — AB (ref 4.0–10.5)

## 2017-02-08 LAB — PROTIME-INR
INR: 1.41
Prothrombin Time: 17.3 seconds — ABNORMAL HIGH (ref 11.4–15.2)

## 2017-02-08 LAB — TROPONIN I: Troponin I: 0.06 ng/mL (ref <0.03)

## 2017-02-08 MED ORDER — PERFLUTREN LIPID MICROSPHERE
1.0000 mL | INTRAVENOUS | Status: AC | PRN
  Administered 2017-02-08: 2 mL via INTRAVENOUS
  Filled 2017-02-08: qty 10

## 2017-02-08 MED ORDER — SODIUM CHLORIDE 0.9 % IV SOLN
INTRAVENOUS | Status: DC
  Administered 2017-02-08 – 2017-02-12 (×6): via INTRAVENOUS

## 2017-02-08 NOTE — Care Management Note (Addendum)
Case Management Note  Patient Details  Name: Terry Wu MRN: [id number] Date of Birth: 06/22/1932  Subjective/Objective:     Large sigmoid (rectosigmoid junction) colon cancer 2. Rectal tubulovillous adenoma/ high grade dysplasia - addressed by colonoscopy 3. Cholelithiasis - probably asymptomatic 4. Large chronically incarcerated umbilical hernia 5. Hx of prostate cancer with radiation seeds   S/p Hartmann's procedure and suture repair of umbilical hernia 0/71               Action/Plan:  Pt is currently intubated   Expected Discharge Date:                  Expected Discharge Plan:     In-House Referral:     Discharge planning Services  CM Consult  Post Acute Care Choice:    Choice offered to:     DME Arranged:    DME Agency:     HH Arranged:    HH Agency:     Status of Service:     If discussed at [employer], dates discussed:    Additional Comments: CIR recommended - CSW following for back up plan Maryclare Labrador, RN 02/08/2017, 4:05 PM

## 2017-02-08 NOTE — Progress Notes (Signed)
  Echocardiogram 2D Echocardiogram with Definity has been performed.  Tresa Res 02/08/2017, 11:56 AM

## 2017-02-08 NOTE — Consult Note (Addendum)
[facility] [facility] Nurse ostomy consult note Surgical team is following for assessment and plan of care for abd wound. Stoma type/location: Colostomy surgery was performed yesterday  Stomal assessment/size: Stoma 50% yellow, 50% red, slightly above skin level when visualized through the pouch which is intact with a good seal and was applied yesterday. Output: No stool or flatus, scant amt pink drainage in the pouch Ostomy pouching: 2pc.  Education provided: No family in the room and pt is sedated.  Will begin ostomy teaching sessions when pt is stable and out of ICU.  Supplies ordered to the bedside for staff nurse use. Enrolled patient in [facility] program: No Julien Girt MSN, [place], [place], [place], [place]

## 2017-02-08 NOTE — Consult Note (Signed)
NEURO HOSPITALIST CONSULT NOTE   Requesting physician: Dr. Ashok Cordia  Reason for Consult: Encephalopathy  History obtained from:  Chart  HPI:                                                                                                                                          Terry Wu is an 83 y.o. male with a past medical history that is significant for hypertension, type two diabetes, CAD, hyperlipidemia, atrial fibrillation, prostate cancer and recent diagnosis and resection of rectosigmoid adenocarcinoma. He was transferred to [facility] from [facility] on 02/06/17 for removal of a large fungating and ulcerated nonobstructive colonic mass that was found on colonoscopy performed due to intractable diarrhea. Biopsy confirmed adenocarcinoma. Terry Wu underwent sigmoid colectomy with colostomy and hernia repair on the morning of 02/07/17. Following surgery, he was transferred from the postanesthesia care unit on peripheral vasopressor infusion for hypotension as well as precedex for sedation. Postoperatively, Terry Wu was in acute respiratory failure, but the PCCM note immediately preceding extubation stated that Terry Wu was awake, alert and in no distress. He was successfully extubated on the morning of 02/08/17   Neurology was consulted due to verbal reports of encephalopathy following extubation.  His wife and daughter were at bedside. His wife states that he is a naturally quiet and private man who was rather quiet before the surgery because the past few days have been a shock for him. She is surprised and pleased with his condition following surgery. She said, "[name] don't do drugs so well" and thought he would be far less alert. She believes that his current state is because the sedating medications he was on over the past couple of days "have done a number" on him. She states that he has no known personal history of stroke or seizure, but that his father had a stroke.  On  my visit, he was awake, alert (although appeared somnolent), and followed commands. We communicated through "yes" and "no" head movements. He acknowledged having a sore throat, which was painful to both speech and swallow. When he attempted to speak, he often repeated, "tomorrow", to which his wife acknowledged telling him that they may be able to go home tomorrow. He stated that he was "not at home". He was unable to provide any orienting information.  Pertinent Imaging/Diagnostics CT head 02/08/17: Atrophy with mild patchy periventricular small vessel disease. Prior infarct anterior superior right cerebellum. No intracranial mass.   Past Medical History:  Diagnosis Date  . Aortic aneurysm (HCC)    Ascending thoracic - 4.8 cm by CT 2017  . Aortic regurgitation    Bioprosthetic AVR 2006  . Atrial fibrillation with controlled ventricular response ([place]) 08/22/2015   CHADS2VASC=5, on Coumadin  . CAD (coronary artery  disease)    Multivessel status post CABG 2006  . Complication of anesthesia   . Esophageal reflux   . Essential hypertension   . History of dizziness   . Mixed hyperlipidemia   . Type 2 diabetes mellitus ([place])     Past Surgical History:  Procedure Laterality Date  . AORTIC VALVE REPLACEMENT  2006   [place] Pericardial tissue valve  . COLON RESECTION SIGMOID N/A 02/07/2017   Procedure: SIGMOID COLECTOMY WITH COLOSTOMY;  Surgeon: Donnie Mesa, MD;  Location: [facility];  Service: General;  Laterality: N/A;  . COLONOSCOPY N/A 02/05/2017   Procedure: COLONOSCOPY;  Surgeon: Rogene Houston, MD;  Location: [facility];  Service: Endoscopy;  Laterality: N/A;  . CORONARY ARTERY BYPASS GRAFT  2006   LIMA to LAD, SVG to diagonal, SVG to OM, SVG to PDA   . HERNIA REPAIR    . TOTAL KNEE ARTHROPLASTY Bilateral   . UMBILICAL HERNIA REPAIR N/A 02/07/2017   Procedure: HERNIA REPAIR UMBILICAL ADULT;  Surgeon: Donnie Mesa, MD;  Location: [facility] OR;  Service: General;  Laterality: N/A;     Family History  Problem Relation Age of Onset  . Heart Problems Mother   . Stroke Father   . Cancer Unknown     Social History:  reports that he has never smoked. He has never used smokeless tobacco. He reports that he does not drink alcohol or use drugs.  Allergies  Allergen Reactions  . Aspirin     Unknown, per pt  . Demerol     Unknown, per pt    MEDICATIONS:                                                                                                                   Current Meds  Medication Sig  . amLODipine (NORVASC) 10 MG tablet TAKE ONE TABLET BY MOUTH ONCE DAILY  . amoxicillin (AMOXIL) 500 MG capsule Take 2,000 mg by mouth as directed.   [name] atorvastatin (LIPITOR) 20 MG tablet TAKE ONE TABLET BY MOUTH ONCE DAILY  . carvedilol (COREG) 12.5 MG tablet TAKE ONE TABLET BY MOUTH TWICE DAILY  . Coenzyme Q10 (CO Q 10) 100 MG CAPS Take 1 capsule by mouth every morning.  . dorzolamide-timolol (COSOPT) 22.3-6.8 MG/ML ophthalmic solution Place 1 drop into both eyes 2 (two) times daily.  [name] glimepiride (AMARYL) 2 MG tablet Take 2 mg by mouth daily before breakfast.   . magnesium oxide (MAG-OX) 400 MG tablet Take 400 mg by mouth daily.  . Multiple Vitamins-Minerals (ICAPS PO) Take 2 tablets by mouth 3 (three) times daily after meals.   . pantoprazole (PROTONIX) 40 MG tablet Take 1 tablet (40 mg total) by mouth daily.  . potassium chloride SA (K-DUR,KLOR-CON) 20 MEQ tablet Take 20 mEq by mouth daily.   [name] spironolactone (ALDACTONE) 25 MG tablet TAKE ONE TABLET BY MOUTH ONCE DAILY  . telmisartan (MICARDIS) 40 MG tablet Take 40 mg by mouth daily.  [name] torsemide (DEMADEX) 100 MG tablet Take 50  mg by mouth daily.   [name] warfarin (COUMADIN) 5 MG tablet Take 1 tablet daily except 1/2 tablet on Mondays, Wednesdays and Fridays (Patient taking differently: Take 1 tablet daily except 1/2 tablet on sundays, Tuesday, thursday and saturdays)     Review Of Systems:                                                                                                            History obtained from unobtainable from patient due to mental status  Blood pressure 128/64, pulse 90, temperature 98.6 F (37 C), temperature source Oral, resp. rate (!) 24, height 5\' 9"  (1.753 m), weight 113 kg (249 lb 3.2 oz), SpO2 95 %.   Physical Examination:                                                                                                      General: WD obese male. Appears calm and comfortable in bed HEENT:  Normocephalic, no lesions, without obvious abnormality.  Normal external eye and conjunctiva.  Normal external ears. Normal external nose. Dry lips Cardiovascular: regular rate and rhythm, pulses palpable throughout   Pulmonary: Breathing unlabored Abdomen: Soft, tender, dressed. Ostemy bag in place Extremities: no joint deformities, effusion, or inflammation Musculoskeletal: no joint deformity or swelling Skin: warm and dry, no hyperpigmentation, vitiligo, or suspicious lesions  Neurological Examination:                                                                                               Mental Status: Terry Wu is alert, knows he is not at home. Able to follow simple commands without difficulty. Cranial Nerves: II: Pupils are equal, round, reactive to light. III,IV, VI: Ptosis not present, extra-ocular muscle movements grossly intact bilaterally V,VII: Smile and eyebrow raise is symmetric.  VIII: Hearing grossly intact IX,X: Swallowing without difficulty XI: Head moves easily in all directions XII: Midline tongue extension Motor: Raises both arms antigravity, squeezes hand on command, wiggles toes bilaterally on command. No drift Sensory: Intact to light touch bilaterally Deep Tendon Reflexes: Diminished throughout Plantars: Right: downgoing   Left: downgoing Cerebellar: He did not perform Gait: Deferred   Lab Results: Basic Metabolic Panel:  Recent Labs Lab  02/03/17 0629 02/04/17  1751 02/06/17 0627 02/07/17 1631 02/07/17 2012 02/08/17 0322  NA 130* 133* 134* 134* 134* 134*  K 4.3 5.0 4.5 4.3 4.7 4.6  CL 98* 97* 98*  --  99* 99*  CO2 25 28 29   --  27 26  GLUCOSE 95 133* 135*  --  140* 142*  BUN 32* 34* 15  --  15 18  CREATININE 1.34* 1.42* 1.03  --  1.19 1.26*  CALCIUM 7.9* 8.0* 8.5*  --  8.4* 8.5*  MG  --   --   --   --  1.8  --   PHOS  --   --   --   --  3.3  --     Liver Function Tests:  Recent Labs Lab 02/07/17 2012 02/08/17 0322  AST 20 18  ALT 24 23  ALKPHOS 29* 31*  BILITOT 1.0 1.2  PROT 5.8* 6.0*  ALBUMIN 2.8* 2.6*   No results for input(s): LIPASE, AMYLASE in the last 168 hours. No results for input(s): AMMONIA in the last 168 hours.  CBC:  Recent Labs Lab 02/03/17 0629 02/04/17 0258 02/04/17 1540 02/05/17 0607 02/06/17 0627 02/07/17 1631 02/07/17 2012 02/08/17 0322  WBC 6.0 6.6 7.4 6.4 13.8*  --  14.0* 13.2*  NEUTROABS 3.6 4.3 5.1  --  11.0*  --  12.6*  --   HGB 9.5* 9.6* 10.0* 10.0* 10.2* 8.2* 9.7* 9.4*  HCT 27.7* 28.5* 29.5* 29.9* 30.8* 24.0* 29.8* 28.5*  MCV 95.5 96.3 95.5 95.5 98.1  --  96.8 95.3  PLT 155 163 183 179 191  --  170 140*    Cardiac Enzymes:  Recent Labs Lab 02/07/17 2012 02/08/17 0322  TROPONINI 0.07* 0.06*    Lipid Panel: No results for input(s): CHOL, TRIG, HDL, CHOLHDL, VLDL, LDLCALC in the last 168 hours.  CBG:  Recent Labs Lab 02/07/17 2042 02/07/17 2349 02/08/17 0317 02/08/17 1133 02/08/17 1531  GLUCAP 139* 128* 134* 139* 141*    Microbiology: Results for orders placed or performed during the hospital encounter of 01/30/17  C difficile quick scan w PCR reflex     Status: None   Collection Time: 01/30/17  6:30 PM  Result Value Ref Range Status   C Diff antigen NEGATIVE NEGATIVE Final   C Diff toxin NEGATIVE NEGATIVE Final   C Diff interpretation No C. difficile detected.  Final  Gastrointestinal Panel by PCR , Stool     Status: None   Collection Time:  01/31/17 12:44 PM  Result Value Ref Range Status   Campylobacter species NOT DETECTED NOT DETECTED Final   Plesimonas shigelloides NOT DETECTED NOT DETECTED Final   Salmonella species NOT DETECTED NOT DETECTED Final   Yersinia enterocolitica NOT DETECTED NOT DETECTED Final   Vibrio species NOT DETECTED NOT DETECTED Final   Vibrio cholerae NOT DETECTED NOT DETECTED Final   Enteroaggregative E coli (EAEC) NOT DETECTED NOT DETECTED Final   Enteropathogenic E coli (EPEC) NOT DETECTED NOT DETECTED Final   Enterotoxigenic E coli (ETEC) NOT DETECTED NOT DETECTED Final   Shiga like toxin producing E coli (STEC) NOT DETECTED NOT DETECTED Final   Shigella/Enteroinvasive E coli (EIEC) NOT DETECTED NOT DETECTED Final   Cryptosporidium NOT DETECTED NOT DETECTED Final   Cyclospora cayetanensis NOT DETECTED NOT DETECTED Final   Entamoeba histolytica NOT DETECTED NOT DETECTED Final   Giardia lamblia NOT DETECTED NOT DETECTED Final   Adenovirus F40/41 NOT DETECTED NOT DETECTED Final   Astrovirus NOT DETECTED NOT DETECTED Final  Norovirus GI/GII NOT DETECTED NOT DETECTED Final   Rotavirus A NOT DETECTED NOT DETECTED Final   Sapovirus (I, II, IV, and V) NOT DETECTED NOT DETECTED Final  Urine Culture     Status: None   Collection Time: 02/04/17  8:23 AM  Result Value Ref Range Status   Specimen Description URINE, CLEAN CATCH  Final   Special Requests NONE  Final   Culture   Final    NO GROWTH Performed at [facility], [address], [place], [place] [address]    Report Status 02/05/2017 FINAL  Final  MRSA PCR Screening     Status: None   Collection Time: 02/07/17  5:53 PM  Result Value Ref Range Status   MRSA by PCR NEGATIVE NEGATIVE Final    Comment:        The GeneXpert MRSA Assay (FDA approved for NASAL specimens only), is one component of a comprehensive MRSA colonization surveillance program. It is not intended to diagnose MRSA infection nor to guide or monitor treatment  for MRSA infections.     Coagulation Studies:  Recent Labs  02/06/17 6301 02/07/17 0506 02/07/17 2012 02/08/17 0332  LABPROT 17.0* 16.7* 16.4* 17.3*  INR 1.37 1.35 1.32 1.41    Imaging: Ct Head Wo Contrast  Result Date: 02/08/2017 CLINICAL DATA:  Altered mental status EXAM: CT HEAD WITHOUT CONTRAST TECHNIQUE: Contiguous axial images were obtained from the base of the skull through the vertex without intravenous contrast. COMPARISON:  August 22, 2015 FINDINGS: Brain: There is mild diffuse atrophy. There is no intracranial mass, hemorrhage, extra-axial fluid collection, or midline shift. There is evidence of an old prior infarct in the anterior superior right cerebellum. There is small patchy small vessel disease in the centra semiovale bilaterally. Elsewhere gray-white compartments appear normal. No acute appearing infarct evident. There are scattered calcifications without surrounding edema, likely granulomas. Vascular: There is no appreciable hyperdense vessel. There are foci of calcification in each carotid siphon and distal vertebral artery. There is also calcification in the proximal basilar artery. Skull: Bony calvarium appears intact. Sinuses/Orbits: There is a retention cyst in the posterior left maxillary antrum. There is mucosal thickening in each maxillary antrum. There is opacification and mucosal thickening in several ethmoid air cells bilaterally. Orbits appear symmetric bilaterally. Other: There is opacification of multiple mastoid air cells on the left, stable. Mastoids on the right are clear. IMPRESSION: Atrophy with mild patchy periventricular small vessel disease. Prior infarct anterior superior right cerebellum. No intracranial mass, hemorrhage, or acute appearing infarct. No subdural or epidural fluid collections. Suspect small calcified granulomas without edema, stable. Areas of paranasal sinus disease. Chronic left-sided mastoid air cell disease. Foci of arterial vascular  calcification noted at multiple sites. Electronically Signed   By: Lowella Grip III M.D.   On: 02/08/2017 16:47   Portable Chest Xray  Result Date: 02/08/2017 CLINICAL DATA:  Respiratory failure. EXAM: PORTABLE CHEST 1 VIEW COMPARISON:  02/07/2017. FINDINGS: Endotracheal tube, NG tube in stable position. Prior cardiac valve replacement. Stable cardiomegaly. Mild bibasilar atelectasis again noted. No pleural effusion or pneumothorax. IMPRESSION: 1. Lines and tubes in stable position. 2.  Prior cardiac valve replacement.  Stable cardiomegaly. 3.  Mild bibasilar atelectasis again noted. Electronically Signed   By: Marcello Moores  Register   On: 02/08/2017 06:38   Dg Chest Port 1 View  Result Date: 02/07/2017 CLINICAL DATA:  83 year old male with history of acute respiratory failure. EXAM: PORTABLE CHEST 1 VIEW COMPARISON:  Chest x-ray 01/30/2017. FINDINGS: An  endotracheal tube is in place with tip 3.4 cm above the carina. A nasogastric tube is seen extending into the stomach, however, the tip of the nasogastric tube extends below the lower margin of the image. Lung volumes are low. Scarring and/or subsegmental atelectasis in the left lung base with probable small left pleural effusion. Right lung is clear. No right pleural effusion. No pneumothorax. Pulmonary vasculature appears engorged, without frank pulmonary edema. Mild enlargement of the cardiopericardial silhouette. The patient is rotated to the right on today's exam, resulting in distortion of the mediastinal contours and reduced diagnostic sensitivity and specificity for mediastinal pathology. Atherosclerosis in the thoracic aorta. Status post median sternotomy for CABG and aortic valve replacement. Surgical clips near the gastroesophageal junction. IMPRESSION: 1. Support apparatus, as above. 2. Low lung volumes with left basilar atelectasis or scarring and small left pleural effusion, unchanged. 3. Cardiomegaly with pulmonary venous congestion, but no  frank pulmonary edema. 4. Aortic atherosclerosis. Electronically Signed   By: Vinnie Langton M.D.   On: 02/07/2017 19:20   Dg Abd Portable 1v  Result Date: 02/08/2017 CLINICAL DATA:  Status post umbilical hernia repair surgery. Follow-up exam. EXAM: PORTABLE ABDOMEN - 1 VIEW COMPARISON:  01/30/2017 FINDINGS: There are mildly dilated loops of small and large bowel identified within the abdomen. These measure up to scratch set the small bowel loops measure up to 3 mm. Skin staples are noted along the midline of the pelvis. Within the left lower a quadrant of the abdomen there is a linear radiodensity which measures approximately 1 cm and is of on certain clinical significance. IMPRESSION: 1. Imaging findings suggestive of postoperative ileus. No evidence for high-grade bowel obstruction. 2. Left lower quadrant linear radiodensity is of uncertain clinical significance. Small foreign body cannot be excluded. Recommend followup imaging with acute abdominal series including upright images. These results will be called to the ordering clinician or representative by the Radiologist Assistant, and communication documented in the PACS or zVision Dashboard. Electronically Signed   By: Kerby Moors M.D.   On: 02/08/2017 16:06     Assessment  Mr. Emmick is an 83 year old gentleman with recent diagnosis and resection of adenocarcinoma of the rectosigmoid colon who was considered to be encephalopathic following extubation earlier today. It is possible that his current condition is medication induced encephalopathy and stroke and compounded by throat pain caused by intubation. Based on his many stroke risk factors (DM, HTN, HLD, family history, hypercoaguability, prior infarct), it would be prudent to get an MRI and continue with stroke workup if positive. Consider stroke workup even if negative based on remote subacute infarct. It appears as though his sedating medications have been discontinued as of the afternoon of  02/08/17. We will know how much this contributes to his current condition as time goes on.  Plan: 1) MRI when able 2) Minimize the use of sedating medications 3) Correction of metabolic abnormalities per PCCM 4) We will continue to follow   Solon Augusta PA-C [facility] [phone] 02/08/2017, 5:45 PM   NEUROHOSPITALIST ADDENDUM Seen and examined the patient. Formulated plan as documented above. Recommendations as above. Will follow.  Karena Addison Aleeya Veitch MD [facility] [phone]  If 7pm to 7am, please call on call as listed on AMION.

## 2017-02-08 NOTE — Progress Notes (Signed)
PULMONARY / CRITICAL CARE MEDICINE   Name: Terry Wu MRN: [id number] DOB: [birth year]    ADMISSION DATE:  01/30/2017 CONSULTATION DATE:  02/07/2017  REFERRING MD:  Dr. Karleen Hampshire  CHIEF COMPLAINT:  Post op resp failure  HISTORY OF PRESENT ILLNESS: Patient is encephalopathic and/or intubated. Therefore history has been obtained from chart review.  83 year old male with PMH as below, which is significant for atrial fibrillation, CAD (CABG/AVR in 2006), DM, HTN, and aortic aneurysm. He was admitted to [facility] 8/8 with intractable diarrhea and dehydration. He was having more than 12 stools per day. He underwent GI workup including colonoscopy, which discovered a 4.4cm ulcerated friable mass and pathology deemed this to be adenocarcinoma. Of note he also has a large protruding umbilical hernia and large gallstones. He was transferred to [facility] 8/15 and underwent sigmoid colectomy with creation of descending colostomy on 8/16. Postoperatively he was recovered to the medical ICU and remained on ventilator. PCCM asked to assist with ICU care.    SUBJECTIVE:  No acute events overnight. Some throat pain this AM related to ETT.   VITAL SIGNS: BP 121/83 (BP Location: Left Arm)   Pulse 93   Temp 99.1 F (37.3 C) (Oral)   Resp (!) 21   Ht 5\' 9"  (1.753 m)   Wt 113 kg (249 lb 3.2 oz) Comment: bed scaqle  SpO2 98%   BMI 36.80 kg/m   HEMODYNAMICS:    VENTILATOR SETTINGS: Vent Mode: PSV;CPAP FiO2 (%):  [36 %-40 %] 40 % Set Rate:  [14 bmp] 14 bmp Vt Set:  [560 mL] 560 mL PEEP:  [5 cmH20] 5 cmH20 Pressure Support:  [10 cmH20] 10 cmH20 Plateau Pressure:  [16 cmH20-27 cmH20] 16 cmH20  INTAKE / OUTPUT: I/O last 3 completed shifts: In: 2051.4 [P.O.:320; I.V.:1731.4] Out: 1435 [Urine:985; Emesis/NG output:350; Blood:100]  PHYSICAL EXAMINATION:  General:  Elderly male awake on vent Neuro:  Awake, follows commands. No distress HEENT:  [place]/AT, PERRL, no JVD Cardiovascular:  irir rate  controlled. No MRG Lungs:  Clear vent assisted breaths.  Abdomen:  Soft. Hypoactive.Midline incision with closure device in place. Colostomy beefy red. Minimal output.  Musculoskeletal:  No acute deformity or ROM limitaiton Skin:  Grossly intact  LABS:  BMET  Recent Labs Lab 02/06/17 0627 02/07/17 1631 02/07/17 2012 02/08/17 0322  NA 134* 134* 134* 134*  K 4.5 4.3 4.7 4.6  CL 98*  --  99* 99*  CO2 29  --  27 26  BUN 15  --  15 18  CREATININE 1.03  --  1.19 1.26*  GLUCOSE 135*  --  140* 142*    Electrolytes  Recent Labs Lab 02/06/17 0627 02/07/17 2012 02/08/17 0322  CALCIUM 8.5* 8.4* 8.5*  MG  --  1.8  --   PHOS  --  3.3  --     CBC  Recent Labs Lab 02/06/17 0627 02/07/17 1631 02/07/17 2012 02/08/17 0322  WBC 13.8*  --  14.0* 13.2*  HGB 10.2* 8.2* 9.7* 9.4*  HCT 30.8* 24.0* 29.8* 28.5*  PLT 191  --  170 140*    Coag's  Recent Labs Lab 02/07/17 0506 02/07/17 2012 02/08/17 0332  INR 1.35 1.32 1.41    Sepsis Markers No results for input(s): LATICACIDVEN, PROCALCITON, O2SATVEN in the last 168 hours.  ABG  Recent Labs Lab 02/07/17 1631 02/07/17 2004  PHART 7.419 7.294*  PCO2ART 44.0 56.1*  PO2ART 98.0 110*    Liver Enzymes  Recent Labs  Lab 02/07/17 2012 02/08/17 0322  AST 20 18  ALT 24 23  ALKPHOS 29* 31*  BILITOT 1.0 1.2  ALBUMIN 2.8* 2.6*    Cardiac Enzymes  Recent Labs Lab 02/07/17 2012 02/08/17 0322  TROPONINI 0.07* 0.06*    Glucose  Recent Labs Lab 02/06/17 2111 02/07/17 1125 02/07/17 1402 02/07/17 2042 02/07/17 2349 02/08/17 0317  GLUCAP 188* 98 91 139* 128* 134*    Imaging Portable Chest Xray  Result Date: 02/08/2017 CLINICAL DATA:  Respiratory failure. EXAM: PORTABLE CHEST 1 VIEW COMPARISON:  02/07/2017. FINDINGS: Endotracheal tube, NG tube in stable position. Prior cardiac valve replacement. Stable cardiomegaly. Mild bibasilar atelectasis again noted. No pleural effusion or pneumothorax. IMPRESSION: 1.  Lines and tubes in stable position. 2.  Prior cardiac valve replacement.  Stable cardiomegaly. 3.  Mild bibasilar atelectasis again noted. Electronically Signed   By: Marcello Moores  Register   On: 02/08/2017 06:38   Dg Chest Port 1 View  Result Date: 02/07/2017 CLINICAL DATA:  83 year old male with history of acute respiratory failure. EXAM: PORTABLE CHEST 1 VIEW COMPARISON:  Chest x-ray 01/30/2017. FINDINGS: An endotracheal tube is in place with tip 3.4 cm above the carina. A nasogastric tube is seen extending into the stomach, however, the tip of the nasogastric tube extends below the lower margin of the image. Lung volumes are low. Scarring and/or subsegmental atelectasis in the left lung base with probable small left pleural effusion. Right lung is clear. No right pleural effusion. No pneumothorax. Pulmonary vasculature appears engorged, without frank pulmonary edema. Mild enlargement of the cardiopericardial silhouette. The patient is rotated to the right on today's exam, resulting in distortion of the mediastinal contours and reduced diagnostic sensitivity and specificity for mediastinal pathology. Atherosclerosis in the thoracic aorta. Status post median sternotomy for CABG and aortic valve replacement. Surgical clips near the gastroesophageal junction. IMPRESSION: 1. Support apparatus, as above. 2. Low lung volumes with left basilar atelectasis or scarring and small left pleural effusion, unchanged. 3. Cardiomegaly with pulmonary venous congestion, but no frank pulmonary edema. 4. Aortic atherosclerosis. Electronically Signed   By: Vinnie Langton M.D.   On: 02/07/2017 19:20     STUDIES:  8/8 CT abd > Umbilical and BILATERAL inguinal hernias containing fat. Increased stool in colon. Small nonobstructing LEFT renal calculus. Cholelithiasis without definite CT evidence of cholecystitis though if this is a clinical concern recommend ultrasound follow-up. Extensive calcified atherosclerotic plaques of the  abdominal aorta including the origins of the abdominal visceral vessels. Colonoscopy 8/14 > A fungating and ulcerated non-obstructing large mass was found in the distal sigmoid colon. The mass was partially circumferential (involving one-third of the lumen circumference). The mass measured four cm in length. In addition, its diameter measured four mm. No bleeding was present. One 15 mm polyp in the rectum, removed. One 17 mm polyp in the rectum, removed.  CULTURES:   ANTIBIOTICS: Cefotetan periop  SIGNIFICANT EVENTS: 8/8 admit 8/15 [facility] to [facility] 8/16 colectomy with colostomy  LINES/TUBES: ETT 8/16 >  DISCUSSION: 83 year old male with AF admitted to [facility] for intractable diarrhea. Found to have colon mass positive for adeno. Transferred to [facility] 8/15 for resection 8/16. Colostomy made. On vent post op.   ASSESSMENT / PLAN:  PULMONARY A: Acute respiratory failure in post-operative setting.   P:   SBT this AM hopeful for extubation.   CARDIOVASCULAR A:  Atrial fibrillation (on warfarin) HTN H/o CAD, CABG, AVR (bioprosthetic)  P:  Telemetry MAP goal > 65 mmHg Off neo  8/16 PM Holding home antihypertensives Holding warfarin, will need to start heparin when OK by surgery  RENAL A:   AKI  P:   Hydrate Follow BMP Strict I&O  GASTROINTESTINAL A:   R Rectosigmoid colon mass S/p colectomy with new colostomy Gallbladder stones  P:   Surgery following Wound/ostomy care following NPO Protonix for SUP  HEMATOLOGIC A:   Anemia Adenocarcinoma of bowel  P:  Follow CBC Will need eventual workup to assess for metastases  SCDs  INFECTIOUS A:   Periop ABX  P:   Hold further ABX for now  ENDOCRINE A:   DM  P:   CBG monitoring every 4 hours and SSI as indicated  NEUROLOGIC A:   Acute encephalopathy due to medical sedation  P:   RASS goal: -1 to -2 Precedex infusion PRN fentanyl PRN versed   FAMILY  - Updates: Patient updated this  AM  - Inter-disciplinary family meet or Palliative Care meeting due by:  8/22   Georgann Housekeeper, AGACNP-BC [place] [facility]Critical Care Pager [phone] or [phone]  02/08/2017 9:04 AM  STAFF NOTE: Linwood Dibbles, MD FACP have personally reviewed patient's available data, including medical history, events of note, physical examination and test results as part of my evaluation. I have discussed with resident/NP and other care providers such as pharmacist, RN and RRT. In addition, I personally evaluated patient and elicited key findings of: awake, alert, no distress on vent, fc, lungs slight coarse, abdo soft, ostomy wnl, pcxr which I reviewed did not show new infiltrate, ett wnl, trop neg, no chest pain, off neo, WUA now, wean aggressive cpap 5 ps5, goal 30 min, assess rsbi, chair when able, NPO, maybe slight atn, follow crt trend with pos balance, with slight rise K , dc LR, use saline, I updated pt in full The patient is critically ill with multiple organ systems failure and requires high complexity decision making for assessment and support, frequent evaluation and titration of therapies, application of advanced monitoring technologies and extensive interpretation of multiple databases.   Critical Care Time devoted to patient care services described in this note is 30 Minutes. This time reflects time of care of this signee: Merrie Roof, MD FACP. This critical care time does not reflect procedure time, or teaching time or supervisory time of PA/NP/Med student/Med Resident etc but could involve care discussion time. Rest per NP/medical resident whose note is outlined above and that I agree with   Lavon Paganini. Titus Mould, MD, [place] Pgr: [place] [facility] 02/08/2017 10:10 AM

## 2017-02-08 NOTE — Procedures (Signed)
Extubation Procedure Note  Patient Details:   Name: JAEVON PARAS DOB: 1932-05-28 MRN: [id number]   Airway Documentation:     Evaluation  O2 sats: stable throughout Complications: No apparent complications Patient did tolerate procedure well. Bilateral Breath Sounds: Diminished   Patient extubated to 4 L [place]. No distress noted. Cuff leak noted prior to extubation. Patient able to verbalize in soft tone post extubation. Will continue to monitor.   Mali M Altheia Shafran 02/08/2017, 10:54 AM

## 2017-02-08 NOTE — Progress Notes (Signed)
Wasted 100 mL of fentanyl in sink, with Tiney Rouge, RN

## 2017-02-08 NOTE — Progress Notes (Signed)
[place] Progress Note Patient Name: Terry Wu DOB: Jan 24, 1932 MRN: [id number]   Date of Service  02/08/2017  HPI/Events of Note  CT head is negative for acute changes Abd x ray reviewed- radiopaque body in LL quadrant.  eICU Interventions  Repeat abd x ray.  If it is still there we will get surgery to evaluate        Tesean Stump 02/08/2017, 5:04 PM

## 2017-02-08 NOTE — Progress Notes (Signed)
1 Day Post-Op   Subjective/Chief Complaint: Patient intubated, managed by CCM No acute events overnight. Some stool in bag   Objective: Vital signs in last 24 hours: Temp:  [97.6 F (36.4 C)-99.1 F (37.3 C)] 98.9 F (37.2 C) (08/17 1134) Pulse Rate:  [47-94] 84 (08/17 1200) Resp:  [14-22] 21 (08/17 1200) BP: (94-131)/(58-90) 131/70 (08/17 1200) SpO2:  [91 %-98 %] 95 % (08/17 1200) Arterial Line BP: (114-153)/(44-61) 145/56 (08/17 0700) FiO2 (%):  [40 %] 40 % (08/17 0900) Last BM Date: 02/06/17  Intake/Output from previous day: 08/16 0701 - 08/17 0700 In: 1731.4 [I.V.:1731.4] Out: 1135 [Urine:685; Emesis/NG output:350; Blood:100] Intake/Output this shift: Total I/O In: 1.3 [I.V.:1.3] Out: 100 [Urine:100]  Obese male in NAD - intubated, sedated Abd - obese, distended; no apparent tenderness Incision c/d/i through honeycomb dressing Ostomy - pink viable with some greenish stool  Lab Results:   Recent Labs  02/07/17 2012 02/08/17 0322  WBC 14.0* 13.2*  HGB 9.7* 9.4*  HCT 29.8* 28.5*  PLT 170 140*   BMET  Recent Labs  02/07/17 2012 02/08/17 0322  NA 134* 134*  K 4.7 4.6  CL 99* 99*  CO2 27 26  GLUCOSE 140* 142*  BUN 15 18  CREATININE 1.19 1.26*  CALCIUM 8.4* 8.5*   PT/INR  Recent Labs  02/07/17 2012 02/08/17 0332  LABPROT 16.4* 17.3*  INR 1.32 1.41   ABG  Recent Labs  02/07/17 1631 02/07/17 2004  PHART 7.419 7.294*  HCO3 28.6* 26.4    Studies/Results: Portable Chest Xray  Result Date: 02/08/2017 CLINICAL DATA:  Respiratory failure. EXAM: PORTABLE CHEST 1 VIEW COMPARISON:  02/07/2017. FINDINGS: Endotracheal tube, NG tube in stable position. Prior cardiac valve replacement. Stable cardiomegaly. Mild bibasilar atelectasis again noted. No pleural effusion or pneumothorax. IMPRESSION: 1. Lines and tubes in stable position. 2.  Prior cardiac valve replacement.  Stable cardiomegaly. 3.  Mild bibasilar atelectasis again noted. Electronically  Signed   By: Marcello Moores  Register   On: 02/08/2017 06:38   Dg Chest Port 1 View  Result Date: 02/07/2017 CLINICAL DATA:  83 year old male with history of acute respiratory failure. EXAM: PORTABLE CHEST 1 VIEW COMPARISON:  Chest x-ray 01/30/2017. FINDINGS: An endotracheal tube is in place with tip 3.4 cm above the carina. A nasogastric tube is seen extending into the stomach, however, the tip of the nasogastric tube extends below the lower margin of the image. Lung volumes are low. Scarring and/or subsegmental atelectasis in the left lung base with probable small left pleural effusion. Right lung is clear. No right pleural effusion. No pneumothorax. Pulmonary vasculature appears engorged, without frank pulmonary edema. Mild enlargement of the cardiopericardial silhouette. The patient is rotated to the right on today's exam, resulting in distortion of the mediastinal contours and reduced diagnostic sensitivity and specificity for mediastinal pathology. Atherosclerosis in the thoracic aorta. Status post median sternotomy for CABG and aortic valve replacement. Surgical clips near the gastroesophageal junction. IMPRESSION: 1. Support apparatus, as above. 2. Low lung volumes with left basilar atelectasis or scarring and small left pleural effusion, unchanged. 3. Cardiomegaly with pulmonary venous congestion, but no frank pulmonary edema. 4. Aortic atherosclerosis. Electronically Signed   By: Vinnie Langton M.D.   On: 02/07/2017 19:20    Anti-infectives: Anti-infectives    Start     Dose/Rate Route Frequency Ordered Stop   02/07/17 1418  cefoTEtan in Dextrose 5% (CEFOTAN) 2-2.08 GM-% IVPB  Status:  Discontinued    Comments:  Schonewitz, Leigh   : cabinet  override      02/07/17 1418 02/07/17 1519   02/07/17 1015  cefoTEtan (CEFOTAN) 1 g in dextrose 5 % 50 mL IVPB     1 g 100 mL/hr over 30 Minutes Intravenous  Once 02/07/17 1012 02/07/17 1241      Assessment/Plan: 1. Large sigmoid (rectosigmoid junction)  colon cancer 2. Rectal tubulovillous adenoma/ high grade dysplasia - addressed by colonoscopy 3. Cholelithiasis - probably asymptomatic 4. Large chronically incarcerated umbilical hernia 5. Hx of prostate cancer with radiation seeds   S/p Hartmann's procedure and suture repair of umbilical hernia 5/46 Beginning to have stool output already If extubated, may begin clear liquids If remains intubated, may begin tube feeds.      LOS: 9 days    Leoni Goodness K. 02/08/2017

## 2017-02-09 ENCOUNTER — Encounter (HOSPITAL_COMMUNITY): Payer: Self-pay | Admitting: Radiology

## 2017-02-09 ENCOUNTER — Inpatient Hospital Stay (HOSPITAL_COMMUNITY): Payer: Medicare Other

## 2017-02-09 ENCOUNTER — Other Ambulatory Visit (HOSPITAL_COMMUNITY): Payer: Medicare Other

## 2017-02-09 DIAGNOSIS — I63412 Cerebral infarction due to embolism of left middle cerebral artery: Secondary | ICD-10-CM

## 2017-02-09 LAB — BASIC METABOLIC PANEL
Anion gap: 7 (ref 5–15)
BUN: 19 mg/dL (ref 6–20)
CALCIUM: 8.5 mg/dL — AB (ref 8.9–10.3)
CO2: 27 mmol/L (ref 22–32)
CREATININE: 0.94 mg/dL (ref 0.61–1.24)
Chloride: 104 mmol/L (ref 101–111)
GFR calc non Af Amer: >60 mL/min (ref >60)
Glucose, Bld: 138 mg/dL — ABNORMAL HIGH (ref 65–99)
Potassium: 4.3 mmol/L (ref 3.5–5.1)
SODIUM: 138 mmol/L (ref 135–145)

## 2017-02-09 LAB — CBC
HCT: 25.9 % — ABNORMAL LOW (ref 39.0–52.0)
Hemoglobin: 8.5 g/dL — ABNORMAL LOW (ref 13.0–17.0)
MCH: 30.8 pg (ref 26.0–34.0)
MCHC: 32.8 g/dL (ref 30.0–36.0)
MCV: 93.8 fL (ref 78.0–100.0)
Platelets: 159 10*3/uL (ref 150–400)
RBC: 2.76 MIL/uL — ABNORMAL LOW (ref 4.22–5.81)
RDW: 13.7 % (ref 11.5–15.5)
WBC: 10.7 10*3/uL — ABNORMAL HIGH (ref 4.0–10.5)

## 2017-02-09 LAB — GLUCOSE, CAPILLARY
GLUCOSE-CAPILLARY: 143 mg/dL — AB (ref 65–99)
GLUCOSE-CAPILLARY: 95 mg/dL (ref 65–99)
GLUCOSE-CAPILLARY: 96 mg/dL (ref 65–99)
Glucose-Capillary: 102 mg/dL — ABNORMAL HIGH (ref 65–99)
Glucose-Capillary: 111 mg/dL — ABNORMAL HIGH (ref 65–99)
Glucose-Capillary: 140 mg/dL — ABNORMAL HIGH (ref 65–99)
Glucose-Capillary: 99 mg/dL (ref 65–99)

## 2017-02-09 NOTE — Evaluation (Signed)
Speech Language Pathology Evaluation Patient Details Name: Terry Wu MRN: [id number] DOB: 1931-10-12 Today's Date: 02/09/2017 Time: 4270-6237 SLP Time Calculation (min) (ACUTE ONLY): 21 min  Problem List:  Patient Active Problem List   Diagnosis Date Noted  . Acute respiratory failure ([place])   . Acute respiratory failure with hypoxia ([place])   . S/P abdominal surgery, follow-up exam   . Preoperative cardiovascular examination   . Chronic diastolic heart failure ([place]) 02/01/2017  . Dehydration 02/01/2017  . Acute kidney injury ([place]) 02/01/2017  . Diarrhea 01/30/2017  . Gait difficulty 08/22/2015  . Dizziness 08/22/2015  . Chronic atrial fibrillation ([place]) 08/22/2015  . Aortic aneurysm ([place]) 02/01/2014  . CAP (community acquired pneumonia) 02/12/2013  . Hyponatremia 02/12/2013  . COPD (chronic obstructive pulmonary disease) ([place]) 02/12/2013  . Bilateral leg edema 02/12/2013  . DIZZINESS 01/26/2010  . SHORTNESS OF BREATH 10/29/2008  . Diabetes type 2, controlled ([place]) 07/23/2008  . Mixed hyperlipidemia 07/23/2008  . HYPERTENSION, BENIGN 07/23/2008  . ESOPHAGEAL REFLUX 07/23/2008  . S/P AVR 07/23/2008  . CORONARY ARTERY BYPASS GRAFT, HX OF 07/23/2008   Past Medical History:  Past Medical History:  Diagnosis Date  . Aortic aneurysm (HCC)    Ascending thoracic - 4.8 cm by CT 2017  . Aortic regurgitation    Bioprosthetic AVR 2006  . Atrial fibrillation with controlled ventricular response ([place]) 08/22/2015   CHADS2VASC=5, on Coumadin  . CAD (coronary artery disease)    Multivessel status post CABG 2006  . Complication of anesthesia   . Esophageal reflux   . Essential hypertension   . History of dizziness   . Mixed hyperlipidemia   . Type 2 diabetes mellitus ([place])    Past Surgical History:  Past Surgical History:  Procedure Laterality Date  . AORTIC VALVE REPLACEMENT  2006   [place] Pericardial tissue valve  . COLON RESECTION SIGMOID N/A 02/07/2017   Procedure:  SIGMOID COLECTOMY WITH COLOSTOMY;  Surgeon: Donnie Mesa, MD;  Location: [place];  Service: General;  Laterality: N/A;  . COLONOSCOPY N/A 02/05/2017   Procedure: COLONOSCOPY;  Surgeon: Rogene Houston, MD;  [facility];  Service: Endoscopy;  Laterality: N/A;  . CORONARY ARTERY BYPASS GRAFT  2006   LIMA to LAD, SVG to diagonal, SVG to OM, SVG to PDA   . HERNIA REPAIR    . TOTAL KNEE ARTHROPLASTY Bilateral   . UMBILICAL HERNIA REPAIR N/A 02/07/2017   Procedure: HERNIA REPAIR UMBILICAL ADULT;  Surgeon: Donnie Mesa, MD;  Location: [place];  Service: General;  Laterality: N/A;   HPI:  83 year old male with PMH significant for atrial fibrillation, CAD (CABG/AVR in 2006), DM, HTN, esophageal reflux and aortic aneurysm. He was admitted to [facility] 8/8 with intractable diarrhea and dehydration. He underwent GI workup including colonoscopy, which discovered a 4.4cm ulcerated friable mass and pathology deemed this to be adenocarcinoma. He was transferred to [facility] 8/15 and underwent sigmoid colectomy with creation of descending colostomy and suture repair of umbilical hernia on 6/28 and was considered to be encephalopathic following extubation on 02/08/17. MRI brain 02/08/17: Patchy acute small LEFT MCA territory infarcts and Multiple old cerebellar infarcts.    Assessment / Plan / Recommendation Clinical Impression  Patient presents with language impairment, expressive>receptive. Speech is characterized by complete sentences, however pt with occasionally halting speech with word-finding difficulties, occasional semantic, phonemic and verbal paraphasias. Sequential commands 4/10, repetition overall 7/10 (sentence level impairment, with paraphasias noted). Confrontation naming 7.5/10 (ded/bed, spoon/pillow, strut/cup).  Recommend skilled ST during acute stay in order to maximize pt's functional communication; he will likely need ongoing SLP treatment in next venue of care. Will hold f/u  recommendations pending OT/PT assessment of needs.     SLP Assessment  SLP Recommendation/Assessment: Patient needs continued Speech Lanaguage Pathology Services SLP Visit Diagnosis: Aphasia (R47.01)    Follow Up Recommendations  Other (comment) (TBD)    Frequency and Duration min 2x/week  2 weeks      SLP Evaluation Cognition  Overall Cognitive Status: Difficult to assess (language impairment; may need further cognitive assessment) Arousal/Alertness: Awake/alert Orientation Level: Oriented to person;Disoriented to place;Disoriented to time;Disoriented to situation Attention: Sustained Sustained Attention: Appears intact       Comprehension  Auditory Comprehension Overall Auditory Comprehension: Impaired Yes/No Questions: Within Functional Limits Commands: Impaired One Step Basic Commands: 75-100% accurate Two Step Basic Commands: 25-49% accurate Conversation: Simple Interfering Components: Hearing EffectiveTechniques: Repetition;Slowed speech;Stressing words;Extra processing time;Increased volume Visual Recognition/Discrimination Discrimination: Not tested Reading Comprehension Reading Status: Not tested    Expression Expression Primary Mode of Expression: Verbal Verbal Expression Overall Verbal Expression: Impaired Initiation: No impairment Automatic Speech: Name;Social Response;Day of week;Month of year Level of Generative/Spontaneous Verbalization: Sentence Repetition: Impaired Level of Impairment: Sentence level Naming: Impairment Confrontation: Impaired Verbal Errors: Semantic paraphasias;Phonemic paraphasias;Aware of errors;Perseveration Pragmatics: No impairment Non-Verbal Means of Communication: Gestures Written Expression Dominant Hand: Right Written Expression: Not tested   Oral / Motor  Oral Motor/Sensory Function Overall Oral Motor/Sensory Function: Within functional limits Motor Speech Overall Motor Speech: Impaired Respiration: Within  functional limits Phonation: Hoarse;Low vocal intensity Resonance: Within functional limits Articulation: Impaired Level of Impairment: Sentence Intelligibility: Intelligible Word: 75-100% accurate Phrase: 75-100% accurate Sentence: 75-100% accurate Conversation: Not tested Motor Planning: Witnin functional limits   [place], [place], [place] [phone]  Aliene Altes 02/09/2017, 3:22 PM

## 2017-02-09 NOTE — Progress Notes (Signed)
Subjective: [name] was alert and talkative on my visit today. He was not oriented to location or year, but acknowledged having word finding difficulties.  Pertinent Labs/Diagnostics: MRI brain 02/08/17: Patchy acute small LEFT MCA territory infarcts and Multiple old cerebellar infarcts. Mild chronic small vessel ischemic disease.  Physical Examination: Vitals:   02/09/17 0600 02/09/17 0809  BP: 126/74   Pulse: 82   Resp: (!) 25   Temp:    SpO2: 97% 96%    General: WD obese male. Appears calm and comfortable in bed HEENT:  Normocephalic, no lesions, without obvious abnormality.  Normal external eye and conjunctiva.  Normal external ears. Normal external nose. Dry lips Cardiovascular: regular rate and rhythm, pulses palpable throughout   Pulmonary: Breathing unlabored Abdomen: Soft, tender, dressed. Ostemy bag in place Extremities: no joint deformities, effusion, or inflammation Musculoskeletal: no joint deformity or swelling Skin: warm and dry, no hyperpigmentation, vitiligo, or suspicious lesions  Neurological Examination:  Speech:  CN: Pupils are equal, round and symmetrically reactive from 3-->2 mm. EOMI without nystagmus. Facial sensation is intact to light touch. Face is symmetric at rest with normal strength and mobility. Hearing is intact to conversational voice. Palate elevates symmetrically and uvula is midline. Voice is soft, but normal in tone, pitch and quality. Head moves easily in all directions. Tongue is midline with normal bulk and mobility.  Motor: Normal bulk, tone. Raises both arms antigravity, squeezes hand on command, wiggles toes bilaterally on command. No drift Sensation: Intact to light touch throughout.  DTRs: Diminished throughout.  Coordination: Finger-to-nose without dysmetria    Assessment: 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 He did not seem to be encephalopathic to me, rather slow to respond with some confusion/expressive aphasia/rumination. He was alert and talkative this morning. His repetition was better today than yesterday, he communicates faster and seems less confused. I suspect that some of his AMS from yesterday was due to retained sedating drugs. MRI revealed acute small LEFT MCA territory infarcts from an embolic source vs [place]. Proceed with routine stroke workup. Ordered MRA head/neck to assist in workup progression. The stroke team will see him from here.   Recommendations: 1) Routine stroke workup -            HgbA1c, fasting lipid panel           MRA head/neck           PT consult, OT consult, Speech consult           Echocardiogram           40 mg of Atorvistatin           Anticoagulation recommended: Aspirin 325 mg now if cleared by surgery, eventually need to be on [facility] for atrial fibrillation           Risk factor modification           Telemetry monitoring           Frequent neuro checks           NPO until passes stroke swallow screen 2) Please call with any questions   Terry Augusta PA-C [facility] [phone]  02/09/2017, 8:11 AM   NEUROHOSPITALIST ADDENDUM Seen and examined the patient this AM.  Has word finding difficulty, naming. Repetition intact.   Terry Addison Aroor MD [facility] [phone]  If 7pm to 7am,  please call on call as listed on AMION.

## 2017-02-09 NOTE — Progress Notes (Signed)
2 Days Post-Op   Subjective/Chief Complaint: Pt doing well today with some abd soreness    Objective: Vital signs in last 24 hours: Temp:  [98.3 F (36.8 C)-99.1 F (37.3 C)] 98.9 F (37.2 C) (08/18 0446) Pulse Rate:  [76-93] 82 (08/18 0600) Resp:  [16-28] 25 (08/18 0600) BP: (118-141)/(58-83) 126/74 (08/18 0600) SpO2:  [87 %-98 %] 97 % (08/18 0600) FiO2 (%):  [40 %] 40 % (08/17 0900) Last BM Date: 02/06/17  Intake/Output from previous day: 08/17 0701 - 08/18 0700 In: 2218 [I.V.:2218] Out: 1310 [Urine:1310] Intake/Output this shift: No intake/output data recorded.  General appearance: alert and cooperative GI: soft, min ttp, ostomy pink/patent, active BS  Lab Results:   Recent Labs  02/08/17 0322 02/09/17 0430  WBC 13.2* 10.7*  HGB 9.4* 8.5*  HCT 28.5* 25.9*  PLT 140* 159   BMET  Recent Labs  02/08/17 0322 02/09/17 0430  NA 134* 138  K 4.6 4.3  CL 99* 104  CO2 26 27  GLUCOSE 142* 138*  BUN 18 19  CREATININE 1.26* 0.94  CALCIUM 8.5* 8.5*   PT/INR  Recent Labs  02/07/17 2012 02/08/17 0332  LABPROT 16.4* 17.3*  INR 1.32 1.41   ABG  Recent Labs  02/07/17 1631 02/07/17 2004  PHART 7.419 7.294*  HCO3 28.6* 26.4    Studies/Results: Ct Head Wo Contrast  Result Date: 02/08/2017 CLINICAL DATA:  Altered mental status EXAM: CT HEAD WITHOUT CONTRAST TECHNIQUE: Contiguous axial images were obtained from the base of the skull through the vertex without intravenous contrast. COMPARISON:  August 22, 2015 FINDINGS: Brain: There is mild diffuse atrophy. There is no intracranial mass, hemorrhage, extra-axial fluid collection, or midline shift. There is evidence of an old prior infarct in the anterior superior right cerebellum. There is small patchy small vessel disease in the centra semiovale bilaterally. Elsewhere gray-white compartments appear normal. No acute appearing infarct evident. There are scattered calcifications without surrounding edema, likely  granulomas. Vascular: There is no appreciable hyperdense vessel. There are foci of calcification in each carotid siphon and distal vertebral artery. There is also calcification in the proximal basilar artery. Skull: Bony calvarium appears intact. Sinuses/Orbits: There is a retention cyst in the posterior left maxillary antrum. There is mucosal thickening in each maxillary antrum. There is opacification and mucosal thickening in several ethmoid air cells bilaterally. Orbits appear symmetric bilaterally. Other: There is opacification of multiple mastoid air cells on the left, stable. Mastoids on the right are clear. IMPRESSION: Atrophy with mild patchy periventricular small vessel disease. Prior infarct anterior superior right cerebellum. No intracranial mass, hemorrhage, or acute appearing infarct. No subdural or epidural fluid collections. Suspect small calcified granulomas without edema, stable. Areas of paranasal sinus disease. Chronic left-sided mastoid air cell disease. Foci of arterial vascular calcification noted at multiple sites. Electronically Signed   By: Lowella Grip III M.D.   On: 02/08/2017 16:47   Mr Brain Wo Contrast  Result Date: 02/09/2017 CLINICAL DATA:  Encephalopathy. 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 History of hypertension, atrial fibrillation and diabetes EXAM: MRI HEAD WITHOUT CONTRAST TECHNIQUE: Multiplanar, multiecho pulse sequences of the brain and surrounding structures were obtained without intravenous contrast. COMPARISON:  CT HEAD February 08, 2017 FINDINGS: BRAIN: Patchy reduced diffusion LEFT frontal and parietal lobes, sparing of the insula ; Corresponding low ADC values. Ventricles and sulci are overall normal for patient's age. Old RIGHT cerebellar infarcts. Scattered susceptibility artifact in the cerebellum predominately corresponding to old infarcts. Scattered subcentimeter  supratentorial white matter FLAIR T2 hyperintensities  exclusive of the aforementioned abnormality compatible with mild chronic small vessel ischemic disease, less than expected for age. Ventricles and sulci are normal for patient's age. No abnormal parenchymal signal, mass effect or masses. 7 mm low T2 signal mass LEFT planum sphenoidale. No abnormal extra-axial fluid collections. VASCULAR: Normal major intracranial vascular flow voids present at skull base. SKULL AND UPPER CERVICAL SPINE: No abnormal sellar expansion. No suspicious calvarial bone marrow signal. Craniocervical junction maintained. SINUSES/ORBITS: The mastoid air-cells and included paranasal sinuses are well-aerated. The included ocular globes and orbital contents are non-suspicious. OTHER: None. IMPRESSION: 1. Patchy acute small LEFT MCA territory infarcts. 2. Multiple old cerebellar infarcts. Mild chronic small vessel ischemic disease. 3. 8 mm LEFT planum sphenoidale meningioma. Electronically Signed   By: Elon Alas M.D.   On: 02/09/2017 01:25   Portable Chest Xray  Result Date: 02/08/2017 CLINICAL DATA:  Respiratory failure. EXAM: PORTABLE CHEST 1 VIEW COMPARISON:  02/07/2017. FINDINGS: Endotracheal tube, NG tube in stable position. Prior cardiac valve replacement. Stable cardiomegaly. Mild bibasilar atelectasis again noted. No pleural effusion or pneumothorax. IMPRESSION: 1. Lines and tubes in stable position. 2.  Prior cardiac valve replacement.  Stable cardiomegaly. 3.  Mild bibasilar atelectasis again noted. Electronically Signed   By: Marcello Moores  Register   On: 02/08/2017 06:38   Dg Chest Port 1 View  Result Date: 02/07/2017 CLINICAL DATA:  83 year old male with history of acute respiratory failure. EXAM: PORTABLE CHEST 1 VIEW COMPARISON:  Chest x-ray 01/30/2017. FINDINGS: An endotracheal tube is in place with tip 3.4 cm above the carina. A nasogastric tube is seen extending into the stomach, however, the tip of the nasogastric tube extends below the lower margin of the image. Lung  volumes are low. Scarring and/or subsegmental atelectasis in the left lung base with probable small left pleural effusion. Right lung is clear. No right pleural effusion. No pneumothorax. Pulmonary vasculature appears engorged, without frank pulmonary edema. Mild enlargement of the cardiopericardial silhouette. The patient is rotated to the right on today's exam, resulting in distortion of the mediastinal contours and reduced diagnostic sensitivity and specificity for mediastinal pathology. Atherosclerosis in the thoracic aorta. Status post median sternotomy for CABG and aortic valve replacement. Surgical clips near the gastroesophageal junction. IMPRESSION: 1. Support apparatus, as above. 2. Low lung volumes with left basilar atelectasis or scarring and small left pleural effusion, unchanged. 3. Cardiomegaly with pulmonary venous congestion, but no frank pulmonary edema. 4. Aortic atherosclerosis. Electronically Signed   By: Vinnie Langton M.D.   On: 02/07/2017 19:20   Dg Abd 2 Views  Result Date: 02/08/2017 CLINICAL DATA:  Radiopaque structure in area of left lower quadrant ostomy EXAM: ABDOMEN - 2 VIEW COMPARISON:  CT abdomen and pelvis January 30, 2017; abdominal radiograph February 08, 2017 FINDINGS: Supine and upright images are obtained. There are 2 adjacent linear radiopaque foreign bodies in the left lower quadrant which appear to represent small clips. Clips are also noted at the gastroesophageal junction. There are skin staples in the lower pelvis. There are apparent seed implants in the prostate. There is no appreciable bowel dilatation or air-fluid level to suggest bowel obstruction. No evident free air. There is a change in left base. There is aortic atherosclerotic calcification. IMPRESSION: The linear radiopaque foreign bodies in the left lower quadrant appear represent small clips. There also clips at the gastroesophageal junction. There is atherosclerotic calcification in the aorta. There are  apparent seed implants in the prostate. No bowel  dilatation or air-fluid level to suggest bowel obstruction. No free air. Aortic Atherosclerosis (ICD10-I70.0). Electronically Signed   By: Lowella Grip III M.D.   On: 02/08/2017 18:14   Dg Abd Portable 1v  Result Date: 02/08/2017 CLINICAL DATA:  Status post umbilical hernia repair surgery. Follow-up exam. EXAM: PORTABLE ABDOMEN - 1 VIEW COMPARISON:  01/30/2017 FINDINGS: There are mildly dilated loops of small and large bowel identified within the abdomen. These measure up to scratch set the small bowel loops measure up to 3 mm. Skin staples are noted along the midline of the pelvis. Within the left lower a quadrant of the abdomen there is a linear radiodensity which measures approximately 1 cm and is of on certain clinical significance. IMPRESSION: 1. Imaging findings suggestive of postoperative ileus. No evidence for high-grade bowel obstruction. 2. Left lower quadrant linear radiodensity is of uncertain clinical significance. Small foreign body cannot be excluded. Recommend followup imaging with acute abdominal series including upright images. These results will be called to the ordering clinician or representative by the Radiologist Assistant, and communication documented in the PACS or zVision Dashboard. Electronically Signed   By: Kerby Moors M.D.   On: 02/08/2017 16:06    Anti-infectives: Anti-infectives    Start     Dose/Rate Route Frequency Ordered Stop   02/07/17 1418  cefoTEtan in Dextrose 5% (CEFOTAN) 2-2.08 GM-% IVPB  Status:  Discontinued    Comments:  Schonewitz, Leigh   : cabinet override      02/07/17 1418 02/07/17 1519   02/07/17 1015  cefoTEtan (CEFOTAN) 1 g in dextrose 5 % 50 mL IVPB     1 g 100 mL/hr over 30 Minutes Intravenous  Once 02/07/17 1012 02/07/17 1241      Assessment/Plan: 1. Large sigmoid (rectosigmoid junction) colon cancer 2. Rectal tubulovillous adenoma/ high grade dysplasia - addressed by  colonoscopy 3. Cholelithiasis - probably asymptomatic 4. Large chronically incarcerated umbilical hernia 5. Hx of prostate cancer with radiation seeds   S/p Hartmann's procedure and suture repair of umbilical hernia 9/52  -Start on CLD -Mobilize to chair and with PT   LOS: 10 days    Rosario Jacks., [facility] 02/09/2017

## 2017-02-09 NOTE — Progress Notes (Signed)
PULMONARY / CRITICAL CARE MEDICINE   Name: Terry Wu MRN: [id number] DOB: [birth year]    ADMISSION DATE:  01/30/2017 CONSULTATION DATE:  02/07/2017  REFERRING MD:  Dr. Karleen Hampshire  CHIEF COMPLAINT:  Post op resp failure  HISTORY OF PRESENT ILLNESS: Patient is encephalopathic and/or intubated. Therefore history has been obtained from chart review.  83 year old male with PMH as below, which is significant for atrial fibrillation, CAD (CABG/AVR in 2006), DM, HTN, and aortic aneurysm. He was admitted to [facility] 8/8 with intractable diarrhea and dehydration. He was having more than 12 stools per day. He underwent GI workup including colonoscopy, which discovered a 4.4cm ulcerated friable mass and pathology deemed this to be adenocarcinoma. Of note he also has a large protruding umbilical hernia and large gallstones. He was transferred to [facility] 8/15 and underwent sigmoid colectomy with creation of descending colostomy on 8/16. Postoperatively he was recovered to the medical ICU and remained on ventilator. PCCM asked to assist with ICU care.    SUBJECTIVE:  MCA cva More alert Talking better  VITAL SIGNS: BP (!) 141/94   Pulse 87   Temp 98.7 F (37.1 C) (Oral)   Resp (!) 28   Ht 5\' 9"  (1.753 m)   Wt 113 kg (249 lb 3.2 oz) Comment: bed scaqle  SpO2 95%   BMI 36.80 kg/m   HEMODYNAMICS:    VENTILATOR SETTINGS:    INTAKE / OUTPUT: I/O last 3 completed shifts: In: 2449.4 [I.V.:2449.4] Out: 1995 [Urine:1645; Emesis/NG output:350]  PHYSICAL EXAMINATION:  General: awake, no distress Neuro: fc, moves all , dysarthria better, some confusion HEENT: jvd nwl, obese PULM: CTA reduced CV: s1 s2 IRR controlled GI: soft, low Bs, wound good clean Extremities:  Edema mild   LABS:  BMET  Recent Labs Lab 02/07/17 2012 02/08/17 0322 02/09/17 0430  NA 134* 134* 138  K 4.7 4.6 4.3  CL 99* 99* 104  CO2 27 26 27   BUN 15 18 19   CREATININE 1.19 1.26* 0.94  GLUCOSE 140*  142* 138*    Electrolytes  Recent Labs Lab 02/07/17 2012 02/08/17 0322 02/09/17 0430  CALCIUM 8.4* 8.5* 8.5*  MG 1.8  --   --   PHOS 3.3  --   --     CBC  Recent Labs Lab 02/07/17 2012 02/08/17 0322 02/09/17 0430  WBC 14.0* 13.2* 10.7*  HGB 9.7* 9.4* 8.5*  HCT 29.8* 28.5* 25.9*  PLT 170 140* 159    Coag's  Recent Labs Lab 02/07/17 0506 02/07/17 2012 02/08/17 0332  INR 1.35 1.32 1.41    Sepsis Markers No results for input(s): LATICACIDVEN, PROCALCITON, O2SATVEN in the last 168 hours.  ABG  Recent Labs Lab 02/07/17 1631 02/07/17 2004  PHART 7.419 7.294*  PCO2ART 44.0 56.1*  PO2ART 98.0 110*    Liver Enzymes  Recent Labs Lab 02/07/17 2012 02/08/17 0322  AST 20 18  ALT 24 23  ALKPHOS 29* 31*  BILITOT 1.0 1.2  ALBUMIN 2.8* 2.6*    Cardiac Enzymes  Recent Labs Lab 02/07/17 2012 02/08/17 0322  TROPONINI 0.07* 0.06*    Glucose  Recent Labs Lab 02/08/17 1133 02/08/17 1531 02/08/17 2040 02/09/17 0013 02/09/17 0420 02/09/17 0814  GLUCAP 139* 141* 139* 143* 140* 111*    Imaging Ct Head Wo Contrast  Result Date: 02/08/2017 CLINICAL DATA:  Altered mental status EXAM: CT HEAD WITHOUT CONTRAST TECHNIQUE: Contiguous axial images were obtained from the base of the skull through the vertex without intravenous  contrast. COMPARISON:  August 22, 2015 FINDINGS: Brain: There is mild diffuse atrophy. There is no intracranial mass, hemorrhage, extra-axial fluid collection, or midline shift. There is evidence of an old prior infarct in the anterior superior right cerebellum. There is small patchy small vessel disease in the centra semiovale bilaterally. Elsewhere gray-white compartments appear normal. No acute appearing infarct evident. There are scattered calcifications without surrounding edema, likely granulomas. Vascular: There is no appreciable hyperdense vessel. There are foci of calcification in each carotid siphon and distal vertebral artery.  There is also calcification in the proximal basilar artery. Skull: Bony calvarium appears intact. Sinuses/Orbits: There is a retention cyst in the posterior left maxillary antrum. There is mucosal thickening in each maxillary antrum. There is opacification and mucosal thickening in several ethmoid air cells bilaterally. Orbits appear symmetric bilaterally. Other: There is opacification of multiple mastoid air cells on the left, stable. Mastoids on the right are clear. IMPRESSION: Atrophy with mild patchy periventricular small vessel disease. Prior infarct anterior superior right cerebellum. No intracranial mass, hemorrhage, or acute appearing infarct. No subdural or epidural fluid collections. Suspect small calcified granulomas without edema, stable. Areas of paranasal sinus disease. Chronic left-sided mastoid air cell disease. Foci of arterial vascular calcification noted at multiple sites. Electronically Signed   By: Lowella Grip III M.D.   On: 02/08/2017 16:47   Mr Brain Wo Contrast  Result Date: 02/09/2017 CLINICAL DATA:  Encephalopathy. 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 History of hypertension, atrial fibrillation and diabetes EXAM: MRI HEAD WITHOUT CONTRAST TECHNIQUE: Multiplanar, multiecho pulse sequences of the brain and surrounding structures were obtained without intravenous contrast. COMPARISON:  CT HEAD February 08, 2017 FINDINGS: BRAIN: Patchy reduced diffusion LEFT frontal and parietal lobes, sparing of the insula ; Corresponding low ADC values. Ventricles and sulci are overall normal for patient's age. Old RIGHT cerebellar infarcts. Scattered susceptibility artifact in the cerebellum predominately corresponding to old infarcts. Scattered subcentimeter supratentorial white matter FLAIR T2 hyperintensities exclusive of the aforementioned abnormality compatible with mild chronic small vessel ischemic disease, less than expected for age. Ventricles and sulci are  normal for patient's age. No abnormal parenchymal signal, mass effect or masses. 7 mm low T2 signal mass LEFT planum sphenoidale. No abnormal extra-axial fluid collections. VASCULAR: Normal major intracranial vascular flow voids present at skull base. SKULL AND UPPER CERVICAL SPINE: No abnormal sellar expansion. No suspicious calvarial bone marrow signal. Craniocervical junction maintained. SINUSES/ORBITS: The mastoid air-cells and included paranasal sinuses are well-aerated. The included ocular globes and orbital contents are non-suspicious. OTHER: None. IMPRESSION: 1. Patchy acute small LEFT MCA territory infarcts. 2. Multiple old cerebellar infarcts. Mild chronic small vessel ischemic disease. 3. 8 mm LEFT planum sphenoidale meningioma. Electronically Signed   By: Elon Alas M.D.   On: 02/09/2017 01:25   Dg Abd 2 Views  Result Date: 02/08/2017 CLINICAL DATA:  Radiopaque structure in area of left lower quadrant ostomy EXAM: ABDOMEN - 2 VIEW COMPARISON:  CT abdomen and pelvis January 30, 2017; abdominal radiograph February 08, 2017 FINDINGS: Supine and upright images are obtained. There are 2 adjacent linear radiopaque foreign bodies in the left lower quadrant which appear to represent small clips. Clips are also noted at the gastroesophageal junction. There are skin staples in the lower pelvis. There are apparent seed implants in the prostate. There is no appreciable bowel dilatation or air-fluid level to suggest bowel obstruction. No evident free air. There is a change in left base. There is aortic atherosclerotic calcification.  IMPRESSION: The linear radiopaque foreign bodies in the left lower quadrant appear represent small clips. There also clips at the gastroesophageal junction. There is atherosclerotic calcification in the aorta. There are apparent seed implants in the prostate. No bowel dilatation or air-fluid level to suggest bowel obstruction. No free air. Aortic Atherosclerosis (ICD10-I70.0).  Electronically Signed   By: Lowella Grip III M.D.   On: 02/08/2017 18:14   Dg Abd Portable 1v  Result Date: 02/08/2017 CLINICAL DATA:  Status post umbilical hernia repair surgery. Follow-up exam. EXAM: PORTABLE ABDOMEN - 1 VIEW COMPARISON:  01/30/2017 FINDINGS: There are mildly dilated loops of small and large bowel identified within the abdomen. These measure up to scratch set the small bowel loops measure up to 3 mm. Skin staples are noted along the midline of the pelvis. Within the left lower a quadrant of the abdomen there is a linear radiodensity which measures approximately 1 cm and is of on certain clinical significance. IMPRESSION: 1. Imaging findings suggestive of postoperative ileus. No evidence for high-grade bowel obstruction. 2. Left lower quadrant linear radiodensity is of uncertain clinical significance. Small foreign body cannot be excluded. Recommend followup imaging with acute abdominal series including upright images. These results will be called to the ordering clinician or representative by the Radiologist Assistant, and communication documented in the PACS or zVision Dashboard. Electronically Signed   By: Kerby Moors M.D.   On: 02/08/2017 16:06     STUDIES:  8/8 CT abd > Umbilical and BILATERAL inguinal hernias containing fat. Increased stool in colon. Small nonobstructing LEFT renal calculus. Cholelithiasis without definite CT evidence of cholecystitis though if this is a clinical concern recommend ultrasound follow-up. Extensive calcified atherosclerotic plaques of the abdominal aorta including the origins of the abdominal visceral vessels. Colonoscopy 8/14 > A fungating and ulcerated non-obstructing large mass was found in the distal sigmoid colon. The mass was partially circumferential (involving one-third of the lumen circumference). The mass measured four cm in length. In addition, its diameter measured four mm. No bleeding was present. One 15 mm polyp in the rectum,  removed. One 17 mm polyp in the rectum, removed.  CULTURES:   ANTIBIOTICS: Cefotetan periop  SIGNIFICANT EVENTS: 8/8 admit 8/15 transfer [facility] 8/16 colectomy with colostomy 8/17 MRI done cva, extubated  LINES/TUBES: ETT 8/16 >  DISCUSSION: 83 year old male with AF admitted to [facility] for intractable diarrhea. Found to have colon mass positive for adeno. Transferred to [facility] 8/15 for resection 8/16. Colostomy made. On vent post op.   ASSESSMENT / PLAN:  PULMONARY A: Acute respiratory failure in post-operative setting.  P:   IS as able  CARDIOVASCULAR A:  Atrial fibrillation (on warfarin) HTN H/o CAD, CABG, AVR (bioprosthetic)  P:  Telemetry MAP goal > 70 mmHg with possible watershed Holding home antihypertensives Holding warfarin Asa if okay by sirgery Getting echo, stroke work up  RENAL A:   AKI resolving  P:   Cva, avoid free water Reduce fluids  GASTROINTESTINAL A:   R Rectosigmoid colon mass S/p colectomy with new colostomy Gallbladder stones  P:   Surgery following, cld, need slp with cva Wound/ostomy care following NPO Protonix for SUP  HEMATOLOGIC A:   Anemia Adenocarcinoma of bowel  P:  Follow CBC in am  Avoid dilution, reduce fluids Will need eventual workup to assess for metastases  SCDs  INFECTIOUS A:   Periop ABX  P:   Hold further ABX for now Follow fever curve  ENDOCRINE A:  DM  P:   CBG monitoring every 4 hours and SSI as indicated  NEUROLOGIC A:   Acute encephalopathy due to medical sedation MCA CVA P:   RASS goal: 0 Precedex infusion- off PRN fentanyl limit PRN versed- dc \\echo , carotids Asa if okay by ccs anticoaglation in future   FAMILY  - Updates: Patient updated this AM  - Inter-disciplinary family meet or Palliative Care meeting due by:  8/22  Lavon Paganini. Titus Mould, MD, [place] Pgr: [facility]

## 2017-02-09 NOTE — Evaluation (Signed)
Clinical/Bedside Swallow Evaluation Patient Details  Name: Terry Wu MRN: [id number] Date of Birth: 1931/08/30  Today's Date: 02/09/2017 Time: SLP Start Time (ACUTE ONLY): 24 SLP Stop Time (ACUTE ONLY): 1218 SLP Time Calculation (min) (ACUTE ONLY): 5 min  Past Medical History:  Past Medical History:  Diagnosis Date  . Aortic aneurysm (HCC)    Ascending thoracic - 4.8 cm by CT 2017  . Aortic regurgitation    Bioprosthetic AVR 2006  . Atrial fibrillation with controlled ventricular response ([place]) 08/22/2015   CHADS2VASC=5, on Coumadin  . CAD (coronary artery disease)    Multivessel status post CABG 2006  . Complication of anesthesia   . Esophageal reflux   . Essential hypertension   . History of dizziness   . Mixed hyperlipidemia   . Type 2 diabetes mellitus ([place])    Past Surgical History:  Past Surgical History:  Procedure Laterality Date  . AORTIC VALVE REPLACEMENT  2006   [place] Pericardial tissue valve  . COLON RESECTION SIGMOID N/A 02/07/2017   Procedure: SIGMOID COLECTOMY WITH COLOSTOMY;  Surgeon: Donnie Mesa, MD;  Location: [facility];  Service: General;  Laterality: N/A;  . COLONOSCOPY N/A 02/05/2017   Procedure: COLONOSCOPY;  Surgeon: Rogene Houston, MD;  [facility];  Service: Endoscopy;  Laterality: N/A;  . CORONARY ARTERY BYPASS GRAFT  2006   LIMA to LAD, SVG to diagonal, SVG to OM, SVG to PDA   . HERNIA REPAIR    . TOTAL KNEE ARTHROPLASTY Bilateral   . UMBILICAL HERNIA REPAIR N/A 02/07/2017   Procedure: HERNIA REPAIR UMBILICAL ADULT;  Surgeon: Donnie Mesa, MD;  Location: [place];  Service: General;  Laterality: N/A;   HPI:  83 year old male with PMH significant for atrial fibrillation, CAD (CABG/AVR in 2006), DM, HTN, esophageal reflux and aortic aneurysm. He was admitted to [facility] 8/8 with intractable diarrhea and dehydration. He underwent GI workup including colonoscopy, which discovered a 4.4cm ulcerated friable mass and pathology  deemed this to be adenocarcinoma. He was transferred to [facility] 8/15 and underwent sigmoid colectomy with creation of descending colostomy and suture repair of umbilical hernia on 8/52 and was considered to be encephalopathic following extubation on 02/08/17. MRI brain 02/08/17: Patchy acute small LEFT MCA territory infarcts and Multiple old cerebellar infarcts.    Assessment / Plan / Recommendation Clinical Impression  Patient presents with belching, gagging and complains of regurgitation with approximately 2 oz water. SLP limited PO trials given pt's complaint of nausea. Pt's wife reports pt has history of reflux and at home is on daily protonix; she also reports history of hiatal hernia. Pt's oral motor examination wholly unremarkable; pt does have reduced vocal intensity, hoarseness. No overt signs of aspiration noted with limited trials of thin liquids, however pt is at risk for aspiration given new CVA, intubation. Given limited assessment, pt's nausea with oral intake and multiple risk factors for aspiration, recommend pt remain NPO at this time with the exception of ice chips after oral care. Anticipate pt will be able to initiate a diet when tolerated from a GI standpoint, however instrumental testing may be warranted to determine most appropriate consistency. SLP will f/u next date. SLP Visit Diagnosis: Dysphagia, unspecified (R13.10)    Aspiration Risk  Moderate aspiration risk;Severe aspiration risk    Diet Recommendation NPO;Ice chips PRN after oral care   Liquid Administration via: Cup Medication Administration: Via alternative means    Other  Recommendations Oral Care Recommendations: Oral care BID  Follow up Recommendations Other (comment) (TBD)      Frequency and Duration min 2x/week  2 weeks       Prognosis Prognosis for Safe Diet Advancement: Good      Swallow Study   General Date of Onset: 01/30/17 HPI: 83 year old male with PMH significant for atrial  fibrillation, CAD (CABG/AVR in 2006), DM, HTN, esophageal reflux and aortic aneurysm. He was admitted to [facility] 8/8 with intractable diarrhea and dehydration. He underwent GI workup including colonoscopy, which discovered a 4.4cm ulcerated friable mass and pathology deemed this to be adenocarcinoma. He was transferred to [facility] 8/15 and underwent sigmoid colectomy with creation of descending colostomy and suture repair of umbilical hernia on 2/87 and was considered to be encephalopathic following extubation on 02/08/17. MRI brain 02/08/17: Patchy acute small LEFT MCA territory infarcts and Multiple old cerebellar infarcts.  Type of Study: Bedside Swallow Evaluation Diet Prior to this Study: NPO Temperature Spikes Noted: No Respiratory Status: Nasal cannula History of Recent Intubation: Yes Length of Intubations (days): 2 days Date extubated: 02/08/17 Behavior/Cognition: Alert;Cooperative Oral Cavity Assessment: Within Functional Limits Oral Care Completed by SLP: No Oral Cavity - Dentition: Missing dentition;Poor condition Vision: Functional for self-feeding Self-Feeding Abilities: Able to feed self Patient Positioning: Upright in bed Baseline Vocal Quality: Hoarse;Low vocal intensity Volitional Cough: Weak Volitional Swallow: Able to elicit    Oral/Motor/Sensory Function Overall Oral Motor/Sensory Function: Within functional limits   Ice Chips Ice chips: Within functional limits Presentation: Cup   Thin Liquid Thin Liquid: Impaired Presentation: Cup;Self Fed;Straw Pharyngeal  Phase Impairments: Multiple swallows;Suspected delayed Swallow Other Comments: significant belching, endorses regurgitation    Nectar Thick Nectar Thick Liquid: Not tested   Honey Thick Honey Thick Liquid: Not tested   Puree Puree: Not tested   Solid   GO   Solid: Not tested       [name], MS, CCC-SLP Speech-Language Pathologist [phone]  Aliene Altes 02/09/2017,2:50  PM

## 2017-02-10 ENCOUNTER — Inpatient Hospital Stay (HOSPITAL_COMMUNITY): Payer: Medicare Other

## 2017-02-10 DIAGNOSIS — Z933 Colostomy status: Secondary | ICD-10-CM

## 2017-02-10 DIAGNOSIS — I639 Cerebral infarction, unspecified: Secondary | ICD-10-CM

## 2017-02-10 DIAGNOSIS — N179 Acute kidney failure, unspecified: Secondary | ICD-10-CM

## 2017-02-10 DIAGNOSIS — I4819 Other persistent atrial fibrillation: Secondary | ICD-10-CM

## 2017-02-10 DIAGNOSIS — I6340 Cerebral infarction due to embolism of unspecified cerebral artery: Secondary | ICD-10-CM | POA: Insufficient documentation

## 2017-02-10 LAB — BASIC METABOLIC PANEL
ANION GAP: 11 (ref 5–15)
BUN: 13 mg/dL (ref 6–20)
CO2: 20 mmol/L — ABNORMAL LOW (ref 22–32)
Calcium: 8.5 mg/dL — ABNORMAL LOW (ref 8.9–10.3)
Chloride: 108 mmol/L (ref 101–111)
Creatinine, Ser: 0.85 mg/dL (ref 0.61–1.24)
GFR calc non Af Amer: >60 mL/min (ref >60)
Glucose, Bld: 99 mg/dL (ref 65–99)
POTASSIUM: 4.7 mmol/L (ref 3.5–5.1)
SODIUM: 139 mmol/L (ref 135–145)

## 2017-02-10 LAB — GLUCOSE, CAPILLARY
GLUCOSE-CAPILLARY: 87 mg/dL (ref 65–99)
GLUCOSE-CAPILLARY: 101 mg/dL — AB (ref 65–99)
GLUCOSE-CAPILLARY: 138 mg/dL — AB (ref 65–99)
Glucose-Capillary: 103 mg/dL — ABNORMAL HIGH (ref 65–99)
Glucose-Capillary: 128 mg/dL — ABNORMAL HIGH (ref 65–99)
Glucose-Capillary: 151 mg/dL — ABNORMAL HIGH (ref 65–99)

## 2017-02-10 LAB — HEMOGLOBIN A1C
Hgb A1c MFr Bld: 6.4 % — ABNORMAL HIGH (ref 4.8–5.6)
Mean Plasma Glucose: 136.98 mg/dL

## 2017-02-10 LAB — HEPARIN LEVEL (UNFRACTIONATED): HEPARIN UNFRACTIONATED: 0.39 [IU]/mL (ref 0.30–0.70)

## 2017-02-10 MED ORDER — IPRATROPIUM BROMIDE 0.02 % IN SOLN
0.5000 mg | Freq: Three times a day (TID) | RESPIRATORY_TRACT | Status: DC
  Administered 2017-02-11: 0.5 mg via RESPIRATORY_TRACT
  Filled 2017-02-10: qty 2.5

## 2017-02-10 MED ORDER — FENTANYL CITRATE (PF) 100 MCG/2ML IJ SOLN: 25.0000 ug | INTRAMUSCULAR | Status: DC | PRN

## 2017-02-10 MED ORDER — INSULIN ASPART 100 UNIT/ML ~~LOC~~ SOLN: 0.0000 [IU] | Freq: Every day | SUBCUTANEOUS | Status: DC

## 2017-02-10 MED ORDER — LEVALBUTEROL HCL 0.63 MG/3ML IN NEBU
0.6300 mg | INHALATION_SOLUTION | Freq: Four times a day (QID) | RESPIRATORY_TRACT | Status: DC
  Administered 2017-02-10 (×3): 0.63 mg via RESPIRATORY_TRACT
  Filled 2017-02-10 (×6): qty 3

## 2017-02-10 MED ORDER — CARVEDILOL 3.125 MG PO TABS
3.1250 mg | ORAL_TABLET | Freq: Two times a day (BID) | ORAL | Status: DC
  Administered 2017-02-10 – 2017-02-14 (×8): 3.125 mg via ORAL
  Filled 2017-02-10 (×8): qty 1

## 2017-02-10 MED ORDER — PANTOPRAZOLE SODIUM 40 MG PO TBEC
40.0000 mg | DELAYED_RELEASE_TABLET | Freq: Every day | ORAL | Status: DC
  Administered 2017-02-10 – 2017-02-14 (×5): 40 mg via ORAL
  Filled 2017-02-10 (×5): qty 1

## 2017-02-10 MED ORDER — ATORVASTATIN CALCIUM 20 MG PO TABS
20.0000 mg | ORAL_TABLET | Freq: Every day | ORAL | Status: DC
  Administered 2017-02-10 – 2017-02-13 (×4): 20 mg via ORAL
  Filled 2017-02-10 (×4): qty 1

## 2017-02-10 MED ORDER — LEVALBUTEROL HCL 0.63 MG/3ML IN NEBU: 0.6300 mg | INHALATION_SOLUTION | RESPIRATORY_TRACT | Status: DC | PRN

## 2017-02-10 MED ORDER — HEPARIN (PORCINE) IN NACL 100-0.45 UNIT/ML-% IJ SOLN
1300.0000 [IU]/h | INTRAMUSCULAR | Status: AC
  Administered 2017-02-10 – 2017-02-11 (×2): 1300 [IU]/h via INTRAVENOUS
  Filled 2017-02-10 (×4): qty 250

## 2017-02-10 MED ORDER — INSULIN ASPART 100 UNIT/ML ~~LOC~~ SOLN
0.0000 [IU] | Freq: Three times a day (TID) | SUBCUTANEOUS | Status: DC
  Administered 2017-02-10: 1 [IU] via SUBCUTANEOUS
  Administered 2017-02-11 (×2): 2 [IU] via SUBCUTANEOUS
  Administered 2017-02-12: 1 [IU] via SUBCUTANEOUS
  Administered 2017-02-12: 2 [IU] via SUBCUTANEOUS
  Administered 2017-02-12: 1 [IU] via SUBCUTANEOUS
  Administered 2017-02-13 – 2017-02-14 (×4): 2 [IU] via SUBCUTANEOUS
  Administered 2017-02-14: 1 [IU] via SUBCUTANEOUS

## 2017-02-10 MED ORDER — LEVALBUTEROL HCL 0.63 MG/3ML IN NEBU
0.6300 mg | INHALATION_SOLUTION | Freq: Three times a day (TID) | RESPIRATORY_TRACT | Status: DC
  Administered 2017-02-11: 0.63 mg via RESPIRATORY_TRACT
  Filled 2017-02-10 (×2): qty 3

## 2017-02-10 MED ORDER — IPRATROPIUM BROMIDE 0.02 % IN SOLN
0.5000 mg | Freq: Four times a day (QID) | RESPIRATORY_TRACT | Status: DC
  Administered 2017-02-10 (×3): 0.5 mg via RESPIRATORY_TRACT
  Filled 2017-02-10 (×3): qty 2.5

## 2017-02-10 MED ORDER — ASPIRIN 325 MG PO TABS: 325.0000 mg | ORAL_TABLET | Freq: Every day | ORAL | Status: DC

## 2017-02-10 NOTE — Progress Notes (Signed)
[facility] Progress Note  3 Days Post-Op  Subjective: CC:  Denies abdominal pain. Reports some issues forming words but feels like it is improving. Answering questions appropriately. At baseline lives with his wife and mobilizes independently.   Objective: Vital signs in last 24 hours: Temp:  [98.4 F (36.9 C)-98.6 F (37 C)] 98.6 F (37 C) (08/19 0330) Pulse Rate:  [74-95] 86 (08/19 0600) Resp:  [16-30] 18 (08/19 0700) BP: (115-156)/(68-94) 122/68 (08/19 0700) SpO2:  [93 %-100 %] 99 % (08/19 0756) Last BM Date: 02/10/17  Intake/Output from previous day: 08/18 0701 - 08/19 0700 In: 1552.5 [I.V.:1552.5] Out: 1360 [Urine:1340; Stool:20] Intake/Output this shift: No intake/output data recorded.  PE: Gen:  Alert, NAD, pleasant and cooperative Pulm:  Normal effort, clear to auscultation bilaterally Abd: Soft, protuberant, non-tender, bowel sounds present in all 4 quadrants, midline incision C/D/I staples in place, colostomy pouch flat with minimal sanguinous drainage in pouch. Stoma pink and viable. GU: UOP 1,360 cc/24h Skin: warm and dry, no rashes  Psych: A&Ox3   Lab Results:   Recent Labs  02/08/17 0322 02/09/17 0430  WBC 13.2* 10.7*  HGB 9.4* 8.5*  HCT 28.5* 25.9*  PLT 140* 159   BMET  Recent Labs  02/09/17 0430 02/10/17 0450  NA 138 139  K 4.3 4.7  CL 104 108  CO2 27 20*  GLUCOSE 138* 99  BUN 19 13  CREATININE 0.94 0.85  CALCIUM 8.5* 8.5*   PT/INR  Recent Labs  02/07/17 2012 02/08/17 0332  LABPROT 16.4* 17.3*  INR 1.32 1.41   CMP     Component Value Date/Time   NA 139 02/10/2017 0450   K 4.7 02/10/2017 0450   CL 108 02/10/2017 0450   CO2 20 (L) 02/10/2017 0450   GLUCOSE 99 02/10/2017 0450   BUN 13 02/10/2017 0450   CREATININE 0.85 02/10/2017 0450   CREATININE 0.97 09/22/2015 1139   CALCIUM 8.5 (L) 02/10/2017 0450   PROT 6.0 (L) 02/08/2017 0322   ALBUMIN 2.6 (L) 02/08/2017 0322   AST 18 02/08/2017 0322   ALT 23  02/08/2017 0322   ALKPHOS 31 (L) 02/08/2017 0322   BILITOT 1.2 02/08/2017 0322   GFRNONAA >60 02/10/2017 0450   GFRAA >60 02/10/2017 0450   Lipase  No results found for: LIPASE     Studies/Results: Ct Head Wo Contrast  Result Date: 02/08/2017 CLINICAL DATA:  Altered mental status EXAM: CT HEAD WITHOUT CONTRAST TECHNIQUE: Contiguous axial images were obtained from the base of the skull through the vertex without intravenous contrast. COMPARISON:  August 22, 2015 FINDINGS: Brain: There is mild diffuse atrophy. There is no intracranial mass, hemorrhage, extra-axial fluid collection, or midline shift. There is evidence of an old prior infarct in the anterior superior right cerebellum. There is small patchy small vessel disease in the centra semiovale bilaterally. Elsewhere gray-white compartments appear normal. No acute appearing infarct evident. There are scattered calcifications without surrounding edema, likely granulomas. Vascular: There is no appreciable hyperdense vessel. There are foci of calcification in each carotid siphon and distal vertebral artery. There is also calcification in the proximal basilar artery. Skull: Bony calvarium appears intact. Sinuses/Orbits: There is a retention cyst in the posterior left maxillary antrum. There is mucosal thickening in each maxillary antrum. There is opacification and mucosal thickening in several ethmoid air cells bilaterally. Orbits appear symmetric bilaterally. Other: There is opacification of multiple mastoid air cells on the left, stable. Mastoids on the right are clear. IMPRESSION: Atrophy with mild  patchy periventricular small vessel disease. Prior infarct anterior superior right cerebellum. No intracranial mass, hemorrhage, or acute appearing infarct. No subdural or epidural fluid collections. Suspect small calcified granulomas without edema, stable. Areas of paranasal sinus disease. Chronic left-sided mastoid air cell disease. Foci of arterial  vascular calcification noted at multiple sites. Electronically Signed   By: Lowella Grip III M.D.   On: 02/08/2017 16:47   [name] Head Wo Contrast  Result Date: 02/10/2017 CLINICAL DATA:  Encephalopathy. Follow-up stroke. History of hypertension, hyperlipidemia, atrial fibrillation. EXAM: MRA HEAD WITHOUT CONTRAST MRA NECK WITHOUT CONTRAST TECHNIQUE: Angiographic images of the Circle of Willis were obtained using MRA technique without intravenous contrast. Angiographic images of the neck were obtained using MRA technique without intravenous contrast. Carotid stenosis measurements (when applicable) are obtained utilizing NASCET criteria, using the distal internal carotid diameter as the denominator. COMPARISON:  MRI of the head February 09, 2017 FINDINGS: MRA HEAD FINDINGS ANTERIOR CIRCULATION: Normal flow related enhancement of the included cervical, petrous, cavernous and supraclinoid internal carotid arteries. Patent anterior communicating artery. Patent anterior and middle cerebral arteries, including distal segments. No large vessel occlusion, high-grade stenosis, aneurysm. POSTERIOR CIRCULATION: LEFT vertebral artery is dominant. Basilar artery is patent, with normal flow related enhancement of the main branch vessels. Severe stenosis distal RIGHT V4 segment after the RIGHT posterior-inferior cerebellar artery takeoff. Moderate stenosis mid basilar artery. Patent posterior cerebral arteries. Robust LEFT posterior communicating artery present. No large vessel occlusion, high-grade stenosis,  aneurysm. ANATOMIC VARIANTS: None. Source images and MIP images were reviewed. MRA NECK FINDINGS- generalized poor signal ANTERIOR CIRCULATION: Due to time-of-flight technique arch vessel origins not characterize. Common carotid artery's of patent bilaterally. Potentially hemodynamically significant RIGHT internal carotid artery stenosis, not quantifiable. LEFT internal carotid artery appears widely patent. POSTERIOR  CIRCULATION: Bilateral vertebral arteries are patent to the vertebrobasilar junction. No definite flow limiting stenosis or luminal irregularity. Source images and MIP images were reviewed. IMPRESSION: MRI head: 1. No emergent large vessel occlusion. 2. No significant stenosis anterior circulation. Severe stenosis RIGHT V4 segment and moderate stenosis basilar artery. MRA NECK: 1. Technically limited. Suspected potentially hemodynamically significant stenosis RIGHT internal carotid artery origin. Electronically Signed   By: Elon Alas M.D.   On: 02/10/2017 05:03   [name] Neck Wo Contrast  Result Date: 02/10/2017 CLINICAL DATA:  Encephalopathy. Follow-up stroke. History of hypertension, hyperlipidemia, atrial fibrillation. EXAM: MRA HEAD WITHOUT CONTRAST MRA NECK WITHOUT CONTRAST TECHNIQUE: Angiographic images of the Circle of Willis were obtained using MRA technique without intravenous contrast. Angiographic images of the neck were obtained using MRA technique without intravenous contrast. Carotid stenosis measurements (when applicable) are obtained utilizing NASCET criteria, using the distal internal carotid diameter as the denominator. COMPARISON:  MRI of the head February 09, 2017 FINDINGS: MRA HEAD FINDINGS ANTERIOR CIRCULATION: Normal flow related enhancement of the included cervical, petrous, cavernous and supraclinoid internal carotid arteries. Patent anterior communicating artery. Patent anterior and middle cerebral arteries, including distal segments. No large vessel occlusion, high-grade stenosis, aneurysm. POSTERIOR CIRCULATION: LEFT vertebral artery is dominant. Basilar artery is patent, with normal flow related enhancement of the main branch vessels. Severe stenosis distal RIGHT V4 segment after the RIGHT posterior-inferior cerebellar artery takeoff. Moderate stenosis mid basilar artery. Patent posterior cerebral arteries. Robust LEFT posterior communicating artery present. No large vessel  occlusion, high-grade stenosis,  aneurysm. ANATOMIC VARIANTS: None. Source images and MIP images were reviewed. MRA NECK FINDINGS- generalized poor signal ANTERIOR CIRCULATION: Due to time-of-flight technique arch vessel origins not  characterize. Common carotid artery's of patent bilaterally. Potentially hemodynamically significant RIGHT internal carotid artery stenosis, not quantifiable. LEFT internal carotid artery appears widely patent. POSTERIOR CIRCULATION: Bilateral vertebral arteries are patent to the vertebrobasilar junction. No definite flow limiting stenosis or luminal irregularity. Source images and MIP images were reviewed. IMPRESSION: MRI head: 1. No emergent large vessel occlusion. 2. No significant stenosis anterior circulation. Severe stenosis RIGHT V4 segment and moderate stenosis basilar artery. MRA NECK: 1. Technically limited. Suspected potentially hemodynamically significant stenosis RIGHT internal carotid artery origin. Electronically Signed   By: Elon Alas M.D.   On: 02/10/2017 05:03   Mr Brain Wo Contrast  Result Date: 02/09/2017 CLINICAL DATA:  Encephalopathy. 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 History of hypertension, atrial fibrillation and diabetes EXAM: MRI HEAD WITHOUT CONTRAST TECHNIQUE: Multiplanar, multiecho pulse sequences of the brain and surrounding structures were obtained without intravenous contrast. COMPARISON:  CT HEAD February 08, 2017 FINDINGS: BRAIN: Patchy reduced diffusion LEFT frontal and parietal lobes, sparing of the insula ; Corresponding low ADC values. Ventricles and sulci are overall normal for patient's age. Old RIGHT cerebellar infarcts. Scattered susceptibility artifact in the cerebellum predominately corresponding to old infarcts. Scattered subcentimeter supratentorial white matter FLAIR T2 hyperintensities exclusive of the aforementioned abnormality compatible with mild chronic small vessel ischemic disease, less  than expected for age. Ventricles and sulci are normal for patient's age. No abnormal parenchymal signal, mass effect or masses. 7 mm low T2 signal mass LEFT planum sphenoidale. No abnormal extra-axial fluid collections. VASCULAR: Normal major intracranial vascular flow voids present at skull base. SKULL AND UPPER CERVICAL SPINE: No abnormal sellar expansion. No suspicious calvarial bone marrow signal. Craniocervical junction maintained. SINUSES/ORBITS: The mastoid air-cells and included paranasal sinuses are well-aerated. The included ocular globes and orbital contents are non-suspicious. OTHER: None. IMPRESSION: 1. Patchy acute small LEFT MCA territory infarcts. 2. Multiple old cerebellar infarcts. Mild chronic small vessel ischemic disease. 3. 8 mm LEFT planum sphenoidale meningioma. Electronically Signed   By: Elon Alas M.D.   On: 02/09/2017 01:25   Dg Abd 2 Views  Result Date: 02/08/2017 CLINICAL DATA:  Radiopaque structure in area of left lower quadrant ostomy EXAM: ABDOMEN - 2 VIEW COMPARISON:  CT abdomen and pelvis January 30, 2017; abdominal radiograph February 08, 2017 FINDINGS: Supine and upright images are obtained. There are 2 adjacent linear radiopaque foreign bodies in the left lower quadrant which appear to represent small clips. Clips are also noted at the gastroesophageal junction. There are skin staples in the lower pelvis. There are apparent seed implants in the prostate. There is no appreciable bowel dilatation or air-fluid level to suggest bowel obstruction. No evident free air. There is a change in left base. There is aortic atherosclerotic calcification. IMPRESSION: The linear radiopaque foreign bodies in the left lower quadrant appear represent small clips. There also clips at the gastroesophageal junction. There is atherosclerotic calcification in the aorta. There are apparent seed implants in the prostate. No bowel dilatation or air-fluid level to suggest bowel obstruction. No free  air. Aortic Atherosclerosis (ICD10-I70.0). Electronically Signed   By: Lowella Grip III M.D.   On: 02/08/2017 18:14   Dg Abd Portable 1v  Result Date: 02/08/2017 CLINICAL DATA:  Status post umbilical hernia repair surgery. Follow-up exam. EXAM: PORTABLE ABDOMEN - 1 VIEW COMPARISON:  01/30/2017 FINDINGS: There are mildly dilated loops of small and large bowel identified within the abdomen. These measure up to scratch set the small bowel loops measure up to  3 mm. Skin staples are noted along the midline of the pelvis. Within the left lower a quadrant of the abdomen there is a linear radiodensity which measures approximately 1 cm and is of on certain clinical significance. IMPRESSION: 1. Imaging findings suggestive of postoperative ileus. No evidence for high-grade bowel obstruction. 2. Left lower quadrant linear radiodensity is of uncertain clinical significance. Small foreign body cannot be excluded. Recommend followup imaging with acute abdominal series including upright images. These results will be called to the ordering clinician or representative by the Radiologist Assistant, and communication documented in the PACS or zVision Dashboard. Electronically Signed   By: Kerby Moors M.D.   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 Follow CBC. Atrial fibrillation HTN CAD s/p CABG, AVR   Large sigmoid (rectosigmoid junction) colon cancer POD#3 S/p Hartmann's procedure and suture repair of umbilical hernia 7/68 Dr. Donnie Mesa - afebrile, VSS. WBC 10.7 yesterday - NPO/ice chips per SLP 2/2 dysphagia; appreciate continued recs by SLP - Mobilize to chair and with PT - incentive spirometry    FEN: NPO/ice chips, IVF  ID: Cefotetan perioperatively 8/16  VTE: SCD's Foley: D/C TODAY 8/19  Follow up: staple removal between 8/27-8/30, post-op F/U Dr. Georgette Dover 3-4 weeks.   LOS: 11 days    [facility] 02/10/2017, 8:35 AM Pager: [phone] Consults: [phone] Mon-Fri 7:00 am-4:30 pm Sat-Sun 7:00 am-11:30 am

## 2017-02-10 NOTE — Progress Notes (Signed)
ANTICOAGULATION CONSULT NOTE - Initial Consult  Pharmacy Consult for heparin Indication: atrial fibrillation  Allergies  Allergen Reactions  . Aspirin     Unknown, per pt  . Demerol     Unknown, per pt    Patient Measurements: Height: 5\' 9"  (175.3 cm) Weight: 249 lb 3.2 oz (113 kg) (bed scaqle) IBW/kg (Calculated) : 70.7 Heparin Dosing Weight: 95 kg  Vital Signs: Temp: 98.1 F (36.7 C) (08/19 0834) Temp Source: Oral (08/19 0834) BP: 122/70 (08/19 0900) Pulse Rate: 85 (08/19 0900)  Labs:  Recent Labs  02/07/17 2012 02/08/17 0322 02/08/17 0332 02/09/17 0430 02/10/17 0450  HGB 9.7* 9.4*  --  8.5*  --   HCT 29.8* 28.5*  --  25.9*  --   PLT 170 140*  --  159  --   LABPROT 16.4*  --  17.3*  --   --   INR 1.32  --  1.41  --   --   CREATININE 1.19 1.26*  --  0.94 0.85  TROPONINI 0.07* 0.06*  --   --   --     Estimated Creatinine Clearance: 81.6 mL/min (by C-G formula based on SCr of 0.85 mg/dL).   Medical History: Past Medical History:  Diagnosis Date  . Aortic aneurysm (HCC)    Ascending thoracic - 4.8 cm by CT 2017  . Aortic regurgitation    Bioprosthetic AVR 2006  . Atrial fibrillation with controlled ventricular response ([place]) 08/22/2015   CHADS2VASC=5, on Coumadin  . CAD (coronary artery disease)    Multivessel status post CABG 2006  . Complication of anesthesia   . Esophageal reflux   . Essential hypertension   . History of dizziness   . Mixed hyperlipidemia   . Type 2 diabetes mellitus ([place])     Assessment: History of AFib on warfarin pta PTA dose: 2.5 mg po q Mon/Wed/Fri, 5 mg all other days To start heparin gtt until surgery clears patient for warfarin. hgb 8.5, plts wnl   Goal of Therapy:  Heparin level 0.3-0.7 units/ml Monitor platelets by anticoagulation protocol: Yes    Plan:  -Heparin infusion at 1300 units/hr -Hold bolus -Daily HL, CBC  -Check level this evening   Allyn Bartelson, Jake Church 02/10/2017,10:51 AM

## 2017-02-10 NOTE — Progress Notes (Addendum)
  Speech Language Pathology Treatment: Dysphagia  Patient Details Name: Terry Wu MRN: [id number] DOB: 10-Sep-1931 Today's Date: 02/10/2017 Time: 0955-1006 SLP Time Calculation (min) (ACUTE ONLY): 11 min  Assessment / Plan / Recommendation Clinical Impression  Patient seen for dysphagia follow-up to reassess readiness for diet initiation. Pt alert and interactive today; SLP performed oral care prior to initiating PO trials. Pt tolerates sips via cup and straw of thin liquids, in excess of 3oz with no overt signs of aspiration, occasional mild belching but denies regurgitation, nausea. Consumption of pureed solids WFL, moderately prolonged mastication with regular solids, suspect 2/2 xerostomia, residue clears with subsequent liquid wash. Recommend initiating dys 3 diet with thin liquids, medications whole with liquid, esophageal precautions. SLP will f/u for tolerance, advancement of solids as well as cognitive-linguistic treatment.    HPI HPI: 83 year old male with PMH significant for atrial fibrillation, CAD (CABG/AVR in 2006), DM, HTN, esophageal reflux and aortic aneurysm. He was [facility] to [facility] 8/8 with intractable diarrhea and dehydration. He underwent GI workup including colonoscopy, which discovered a 4.4cm ulcerated friable mass and pathology deemed this to be adenocarcinoma. He was transferred to [facility] 8/15 and underwent sigmoid colectomy with creation of descending colostomy and suture repair of umbilical hernia on 2/70 and was considered to be encephalopathic following extubation on 02/08/17. MRI brain 02/08/17: Patchy acute small LEFT MCA territory infarcts and Multiple old cerebellar infarcts.       SLP Plan  Continue with current plan of care       Recommendations  Diet recommendations: Dysphagia 3 (mechanical soft);Thin liquid Liquids provided via: Cup;Straw Medication Administration: Whole meds with liquid Supervision: Patient able to self  feed;Intermittent supervision to cue for compensatory strategies Compensations: Slow rate;Small sips/bites;Follow solids with liquid Postural Changes and/or Swallow Maneuvers: Seated upright 90 degrees;Upright 30-60 min after meal                Oral Care Recommendations: Oral care BID Follow up Recommendations: Other (comment) (TBD) SLP Visit Diagnosis: Dysphagia, unspecified (R13.10) Plan: Continue with current plan of care       [place], [place], [place] Speech-Language Pathologist [phone]  Aliene Altes 02/10/2017, 10:11 AM

## 2017-02-10 NOTE — Progress Notes (Signed)
STROKE TEAM PROGRESS NOTE   HISTORY OF PRESENT ILLNESS (per record) Terry Wu is an 83 y.o. male with a past medical history that is significant for hypertension, type two diabetes, CAD, hyperlipidemia, atrial fibrillation, prostate cancer and recent diagnosis and resection of rectosigmoid adenocarcinoma. He was transferred to [facility] from [facility] on 02/06/17 for removal of a large fungating and ulcerated nonobstructive colonic mass that was found on colonoscopy performed due to intractable diarrhea. Biopsy confirmed adenocarcinoma. Terry Wu underwent sigmoid colectomy with colostomy and hernia repair on the morning of 02/07/17. Following surgery, he was transferred from the postanesthesia care unit on peripheral vasopressor infusion for hypotension as well as precedex for sedation. Postoperatively, Terry Wu was in acute respiratory failure, but the PCCM note immediately preceding extubation stated that Terry Wu was awake, alert and in no distress. He was successfully extubated on the morning of 02/08/17   Neurology was consulted due to verbal reports of encephalopathy following extubation.  His wife and daughter were at bedside. His wife states that he is a naturally quiet and private man who was rather quiet before the surgery because the past few days have been a shock for him. She is surprised and pleased with his condition following surgery. She said, "Terry Wu old folks don't do drugs so well" and thought he would be far less alert. She believes that his current state is because the sedating medications he was on over the past couple of days "have done a number" on him. She states that he has no known personal history of stroke or seizure, but that his father had a stroke.  On my visit, he was awake, alert (although appeared somnolent), and followed commands. We communicated through "yes" and "no" head movements. He acknowledged having a sore throat, which was painful to both speek and swallow. When he  attempted to speak, he often repeated, "tomorrow", to which his wife acknowledged telling him that they may be able to go home tomorrow. He stated that he was "not at home". He was unable to provide any orienting information.   SUBJECTIVE (INTERVAL HISTORY) No family is at the bedside.  Pt is awake alert, mild lethargy, but able to answer all questions and follows all commands. Fully orientated. Still has afib on tele. MRA neck low quality but raised question of right ICA proximal stenosis. Will do CUS to rule out.   OBJECTIVE Temp:  [98.1 F (36.7 C)-98.6 F (37 C)] 98.1 F (36.7 C) (08/19 0834) Pulse Rate:  [74-95] 85 (08/19 0900) Cardiac Rhythm: Atrial fibrillation (08/19 0800) Resp:  [16-30] 27 (08/19 0900) BP: (115-156)/(68-94) 122/70 (08/19 0900) SpO2:  [93 %-100 %] 99 % (08/19 0900)  CBC:  Recent Labs Lab 02/06/17 0627  02/07/17 2012 02/08/17 0322 02/09/17 0430  WBC 13.8*  --  14.0* 13.2* 10.7*  NEUTROABS 11.0*  --  12.6*  --   --   HGB 10.2*  < > 9.7* 9.4* 8.5*  HCT 30.8*  < > 29.8* 28.5* 25.9*  MCV 98.1  --  96.8 95.3 93.8  PLT 191  --  170 140* 159  < > = values in this interval not displayed.  Basic Metabolic Panel:  Recent Labs Lab 02/07/17 2012  02/09/17 0430 02/10/17 0450  NA 134*  < > 138 139  K 4.7  < > 4.3 4.7  CL 99*  < > 104 108  CO2 27  < > 27 20*  GLUCOSE 140*  < > 138* 99  BUN 15  < >  19 13  CREATININE 1.19  < > 0.94 0.85  CALCIUM 8.4*  < > 8.5* 8.5*  MG 1.8  --   --   --   PHOS 3.3  --   --   --   < > = values in this interval not displayed.  Lipid Panel: No results found for: CHOL, TRIG, HDL, CHOLHDL, VLDL, LDLCALC HgbA1c:  Lab Results  Component Value Date   HGBA1C 6.4 (H) 02/10/2017   Urine Drug Screen: No results found for: LABOPIA, COCAINSCRNUR, LABBENZ, AMPHETMU, THCU, LABBARB  Alcohol Level No results found for: Terry Wu I have personally reviewed the radiological images below and agree with the radiology  interpretations.  Ct Head Wo Contrast 02/08/2017 Atrophy with mild patchy periventricular small vessel disease. Prior infarct anterior superior right cerebellum. No intracranial mass, hemorrhage, or acute appearing infarct. No subdural or epidural fluid collections. Suspect small calcified granulomas without edema, stable. Areas of paranasal sinus disease. Chronic left-sided mastoid air cell disease. Foci of arterial vascular calcification noted at multiple sites.  Terry Wu Head Wo Contrast Terry Wu Neck Wo Contrast 02/10/2017  MRI head:  1. No emergent large vessel occlusion.  2. No significant stenosis anterior circulation. Severe stenosis RIGHT V4 segment and moderate stenosis basilar artery.   MRA NECK:  1. Technically limited. Suspected potentially hemodynamically significant stenosis RIGHT internal carotid artery origin.   CUS pending  Terry Brain Wo Contrast 02/09/2017 1. Patchy acute small LEFT MCA territory infarcts.  2. Multiple old cerebellar infarcts. Mild chronic small vessel ischemic disease.  3. 8 mm LEFT planum sphenoidale meningioma.   Dg Abd 2 Views 02/08/2017 The linear radiopaque foreign bodies in the left lower quadrant appear represent small clips. There also clips at the gastroesophageal junction. There is atherosclerotic calcification in the aorta. There are apparent seed implants in the prostate. No bowel dilatation or air-fluid level to suggest bowel obstruction. No free air. Aortic Atherosclerosis (ICD10-I70.0).   Dg Abd Portable 1v 02/08/2017 1. Imaging findings suggestive of postoperative ileus. No evidence for high-grade bowel obstruction.  2. Left lower quadrant linear radiodensity is of uncertain clinical significance. Small foreign body cannot be excluded. Recommend followup imaging with acute abdominal series including upright images.   Transthoracic echocardiogram 02/08/2017 - Left ventricle: The cavity size was normal. Systolic function was   normal. The  estimated ejection fraction was in the range of 55%   to 60%. Wall motion was normal; there were no regional wall   motion abnormalities. Doppler parameters are consistent with high   ventricular filling pressure. - Aortic valve: A 25mm Edwards pericradial bioprosthesis was   present. - Mitral valve: Transvalvular velocity was within the normal range.   There was no evidence for stenosis. There was no regurgitation. - Left atrium: The atrium was severely dilated. - Right ventricle: The cavity size was normal. Wall thickness was   normal. Systolic function was normal. - Right atrium: The atrium was moderately dilated. - Tricuspid valve: There was mild regurgitation. - Pulmonary arteries: PA peak pressure: 42 mm Hg (S). Impressions: Compared with the echo 08/22/15, the ascending aorta has increased from 4.4 to 4.6 cm.  Bioprosthetic aortic valve gradient has increased from 10 mmHg to 12 mmHg.   PHYSICAL EXAM Vitals:   02/10/17 0756 02/10/17 0800 02/10/17 0834 02/10/17 0900  BP:  137/81  122/70  Pulse:  89  85  Resp:  (!) 23  (!) 27  Temp:   98.1 F (36.7 C)   TempSrc:  Oral   SpO2: 99% 100%  99%  Weight:      Height:        Temp:  [98.1 F (36.7 C)-98.7 F (37.1 C)] 98.7 F (37.1 C) (08/19 1202) Pulse Rate:  [74-95] 87 (08/19 1300) Resp:  [16-30] 25 (08/19 1300) BP: (115-156)/(68-119) 137/119 (08/19 1300) SpO2:  [93 %-100 %] 98 % (08/19 1300)  General - Well nourished, well developed, mild lethargy.  Ophthalmologic - Fundi not visualized due to noncooperation.  Cardiovascular - irregularly irregular heart rate and rhythm.  Mental Status -  Level of arousal and orientation to time, place, and person were intact. Language including expression, naming, repetition, comprehension was assessed and found intact. Fund of Knowledge was assessed and was intact.  Cranial Nerves II - XII - II - Visual field intact OU. III, IV, VI - Extraocular movements intact. V - Facial  sensation intact bilaterally. VII - Facial movement intact bilaterally. VIII - Hearing & vestibular intact bilaterally. X - Palate elevates symmetrically, mild dysarthria. XI - Chin turning & shoulder shrug intact bilaterally. XII - Tongue protrusion intact.  Motor Strength - The patient's strength was 4/5 BUEs and 3/5 BLEs and pronator drift was absent.  Bulk was normal and fasciculations were absent.   Motor Tone - Muscle tone was assessed at the neck and appendages and was normal.  Reflexes - The patient's reflexes were 1+ in all extremities and he had no pathological reflexes.  Sensory - Light touch, temperature/pinprick, vibration and proprioception were assessed and were symmetrical.    Coordination - The patient had normal movements in the hands and feet with no ataxia or dysmetria.  Tremor was absent.  Gait and Station - deferred   ASSESSMENT/PLAN Terry. ZAKAREE MCCLENAHAN is a 83 y.o. male with history of hypertension, diabetes mellitus, coronary artery disease, hyperlipidemia, atrial fibrillation (on Coumadin PTA), prostate cancer, and recent resection of rectosigmoid adenocarcinoma presenting with encephalopathy following extubation. He did not receive IV t-PA due to recent surgery.  Strokes: Patchy acute small LEFT MCA and MCA/ACA territory infarcts - likely embolic from afib off anticoagulation for surgery.  Resultant  Generalized weakness and lethargy  CT head - Atrophy with mild patchy periventricular small vessel disease. Prior infarct anterior superior right cerebellum.  MRI Head -  Patchy acute small LEFT MCA territory infarcts. Multiple old cerebellar infarcts.  MRA Head - No emergent large vessel occlusion.   MRA Neck - Suspected potentially hemodynamically significant stenosis RIGHT internal carotid artery origin, however, very low quality - will do CUS to evaluate  Carotid Doppler - pending  2D Echo - 55% to 60%. No cardiac source of emboli identified.  LDL -  pending (? Cancelled )  HgbA1c - 6.4  VTE prophylaxis - SCDs DIET DYS 3 Room service appropriate? Yes; Fluid consistency: Thin  warfarin daily prior to admission, now on No antithrombotic. Pt has ASA allergy. Due to small size of infarcts, recommend heparin IV for anticoagulation if ok from surgical standpoint. Eventually pt needs to transition to po anticoagulation. Although he has AVR and not FDA-approved for DOACs, recent studies all show DOACs are equally effective in valvular afib. Also, pt likely needs intermittent surgeries or procedures due to his malignancy, DOACs are better in management of anticoagulation than coumadin. Pt is in agreement. Discussed with Dr. Thereasa Solo.   Patient counseled to be compliant with his antithrombotic medications  Ongoing aggressive stroke risk factor management  Therapy recommendations: pending  Disposition: Pending  Afib on coumadin PTA  Off coumadin due to surgery  Hemodynamically stable  Recommend to resume anticoagulation with heparin as the strokes are small in size  Recommend transition to DOACs once stable. Although he has AVR and not FDA-approved for DOACs, recent studies all show DOACs are equally effective in valvular afib. Also, pt likely needs intermittent surgeries or procedures due to his malignancy, DOACs are better in management of anticoagulation than coumadin.   Large sigmoid colon cancer  S/p resection  Surgery is following   Hemodynamically stable  Hypertension  Stable  Permissive hypertension (OK if < 180/105) but gradually normalize in 5-7 days  Long-term BP goal normotensive  Hyperlipidemia  Home meds:  Lipitor 20 mg daily resumed in hospital  LDL pending (? Cancelled ? Why), goal < 70  Continue lipitor on discharge.  Diabetes  HgbA1c 6.4, goal < 7.0  Controlled  SSI  CBG monitoring  PCP follow up  Other Stroke Risk Factors  Advanced age  Obesity, Body mass index is 36.8 kg/m., recommend  weight loss, diet and exercise as appropriate   Hx stroke/TIA by imaging - right cerebellum  Family hx stroke (father)  Coronary artery disease  Other Active Problems  Reported ASA allergy - lip swollen  Leukocytosis - improving - afebrile  The ascending aorta has increased from 4.4 to 4.6 cm. by echo   Hospital day # 11  This patient is critically ill due to left MCA infarct, afib RVR, large colon cancer s/p surgery, not on AC, s/p AVR and at significant risk of neurological worsening, death form recurrent strokes, hemorrhagic transformation, DVT, seizure. This patient's care requires constant monitoring of vital signs, hemodynamics, respiratory and cardiac monitoring, review of multiple databases, neurological assessment, discussion with family, other specialists and medical decision making of high complexity. I spent 35 minutes of neurocritical care time in the care of this patient.  Rosalin Hawking, MD PhD Stroke Neurology 02/10/2017 1:59 PM    To contact Stroke Continuity provider, please refer to [email or web address] After hours, contact General Neurology

## 2017-02-10 NOTE — Progress Notes (Signed)
[place] TEAM 1 - Stepdown/ICU TEAM  Terry Wu  ZOX:[id number] DOB: Mar 27, 1932 DOA: 01/30/2017 PCP: Sinda Du, MD    Brief Narrative:   83yo M with a hx of atrial fibrillation, CAD (CABG/AVR in 2006), DM, HTN, and aortic aneurysm who was admitted to [facility] 8/8 with intractable diarrhea and dehydration. He was having more than 12 stools per day. He underwent a colonoscopy, which discovered a 4.4cm ulcerated friable mass and pathology deemed this to be adenocarcinoma. Of note he also has a large protruding umbilical hernia and large gallstones. He was transferred to [facility] 8/15 and underwent sigmoid colectomy with creation of descending colostomy on 8/16. Postoperatively he was recovered to the medical ICU and remained on ventilator.   Significant Events: 8/8 admit 8/15 transfer to [facility] 8/16 colectomy with colostomy - remained intubated - required short term pressors  8/17 extubated - found to have word finding difficulty - MRI notes CVA  Subjective: The patient is alert and conversant.  He denies chest pain shortness breath nausea vomiting or abdominal pain.  He tells me he is tired but otherwise feels good.  Assessment & Plan:  Large sigmoid colon cancer status post Hartman's procedure and suture repair of umbilical hernia 02/02/90 Ongoing care per General Surgery - appears to be doing well   Large chronically incarcerated umbilical hernia Corrected at time of sigmoid colon cancer surgery  Left acute MCA infarcts 8/17 Neurology suggests initiating aspirin therapy for now, w/ eventual resumption of warfarin - unfortunately the pt reports "swelling of the mouth" when he takes ASA - has persisting word finding difficulty   Acute hypoxic respiratory failure and postoperative setting Has now been extubated and is stable from respiratory standpoint  Dysphagia Due to CVA - SLP following and advancing diet today    Acute kidney injury Resolved with creatinine now  normal  Chronic Atrial fibrillation Chadsvasc previously 5 - was on warfarin at time of admission - unable to utilize aspirin at present due to allergy - resume warfarin when cleared for same by Surgery  CAD status post CABG 2006  Cholelithiasis  Aortic valve replacement 2006 - bioprosthetic   DM2 CBG currently well-controlled  History of prostate cancer status post radioactive seed placement  DVT prophylaxis: SCDs Code Status: FULL CODE Family Communication: no family present at time of exam  Disposition Plan: transfer to tele bed - PT/OT - advancing diet as per SLP - wound care per Surgery   Consultants:  PCCM Gen Surgery  Antimicrobials:  none  Objective: Blood pressure 122/70, pulse 85, temperature 98.1 F (36.7 C), temperature source Oral, resp. rate (!) 27, height '5\' 9"'$  (1.753 m), weight 113 kg (249 lb 3.2 oz), SpO2 99 %.  Intake/Output Summary (Last 24 hours) at 02/10/17 0947 Last data filed at 02/10/17 0900  Gross per 24 hour  Intake           1402.5 ml  Output             1360 ml  Net             42.5 ml   Filed Weights   02/05/17 0600 02/06/17 0620 02/07/17 0614  Weight: 114 kg (251 lb 4.8 oz) 112.9 kg (248 lb 14.4 oz) 113 kg (249 lb 3.2 oz)    Examination: General: No acute respiratory distress Lungs: Clear to auscultation bilaterally without wheezes or crackles Cardiovascular: Regular rate without murmur gallop or rub normal S1 and S2 Abdomen: Nontender, nondistended, soft, wounds dressed  and dry, ostomy appears benign Extremities: trace B LE edema - no cyanosis   CBC:  Recent Labs Lab 02/04/17 0638 02/04/17 1540 02/05/17 0607 02/06/17 0627 02/07/17 1631 02/07/17 2012 02/08/17 0322 02/09/17 0430  WBC 6.6 7.4 6.4 13.8*  --  14.0* 13.2* 10.7*  NEUTROABS 4.3 5.1  --  11.0*  --  12.6*  --   --   HGB 9.6* 10.0* 10.0* 10.2* 8.2* 9.7* 9.4* 8.5*  HCT 28.5* 29.5* 29.9* 30.8* 24.0* 29.8* 28.5* 25.9*  MCV 96.3 95.5 95.5 98.1  --  96.8 95.3 93.8   PLT 163 183 179 191  --  170 140* 510   Basic Metabolic Panel:  Recent Labs Lab 02/06/17 0627 02/07/17 1631 02/07/17 2012 02/08/17 0322 02/09/17 0430 02/10/17 0450  NA 134* 134* 134* 134* 138 139  K 4.5 4.3 4.7 4.6 4.3 4.7  CL 98*  --  99* 99* 104 108  CO2 29  --  '27 26 27 '$ 20*  GLUCOSE 135*  --  140* 142* 138* 99  BUN 15  --  '15 18 19 13  '$ CREATININE 1.03  --  1.19 1.26* 0.94 0.85  CALCIUM 8.5*  --  8.4* 8.5* 8.5* 8.5*  MG  --   --  1.8  --   --   --   PHOS  --   --  3.3  --   --   --    GFR: Estimated Creatinine Clearance: 81.6 mL/min (by C-G formula based on SCr of 0.85 mg/dL).  Liver Function Tests:  Recent Labs Lab 02/07/17 2012 02/08/17 0322  AST 20 18  ALT 24 23  ALKPHOS 29* 31*  BILITOT 1.0 1.2  PROT 5.8* 6.0*  ALBUMIN 2.8* 2.6*   Coagulation Profile:  Recent Labs Lab 02/05/17 0607 02/06/17 0627 02/07/17 0506 02/07/17 2012 02/08/17 0332  INR 1.45 1.37 1.35 1.32 1.41    Cardiac Enzymes:  Recent Labs Lab 02/07/17 2012 02/08/17 0322  TROPONINI 0.07* 0.06*    HbA1C: Hgb A1c MFr Bld  Date/Time Value Ref Range Status  08/23/2015 01:50 AM 6.3 (H) 4.8 - 5.6 % Final    Comment:    (NOTE)         Pre-diabetes: 5.7 - 6.4         Diabetes: >6.4         Glycemic control for adults with diabetes: <7.0     CBG:  Recent Labs Lab 02/09/17 1534 02/09/17 2006 02/09/17 2339 02/10/17 0335 02/10/17 0834  GLUCAP 95 96 99 103* 87    Recent Results (from the past 240 hour(s))  Gastrointestinal Panel by PCR , Stool     Status: None   Collection Time: 01/31/17 12:44 PM  Result Value Ref Range Status   Campylobacter species NOT DETECTED NOT DETECTED Final   Plesimonas shigelloides NOT DETECTED NOT DETECTED Final   Salmonella species NOT DETECTED NOT DETECTED Final   Yersinia enterocolitica NOT DETECTED NOT DETECTED Final   Vibrio species NOT DETECTED NOT DETECTED Final   Vibrio cholerae NOT DETECTED NOT DETECTED Final   Enteroaggregative E  coli (EAEC) NOT DETECTED NOT DETECTED Final   Enteropathogenic E coli (EPEC) NOT DETECTED NOT DETECTED Final   Enterotoxigenic E coli (ETEC) NOT DETECTED NOT DETECTED Final   Shiga like toxin producing E coli (STEC) NOT DETECTED NOT DETECTED Final   Shigella/Enteroinvasive E coli (EIEC) NOT DETECTED NOT DETECTED Final   Cryptosporidium NOT DETECTED NOT DETECTED Final   Cyclospora cayetanensis NOT DETECTED NOT DETECTED  Final   Entamoeba histolytica NOT DETECTED NOT DETECTED Final   Giardia lamblia NOT DETECTED NOT DETECTED Final   Adenovirus F40/41 NOT DETECTED NOT DETECTED Final   Astrovirus NOT DETECTED NOT DETECTED Final   Norovirus GI/GII NOT DETECTED NOT DETECTED Final   Rotavirus A NOT DETECTED NOT DETECTED Final   Sapovirus (I, II, IV, and V) NOT DETECTED NOT DETECTED Final  Urine Culture     Status: None   Collection Time: 02/04/17  8:23 AM  Result Value Ref Range Status   Specimen Description URINE, CLEAN CATCH  Final   Special Requests NONE  Final   Culture   Final    NO GROWTH Performed at [facility], [address] [place], [place] [address]    Report Status 02/05/2017 FINAL  Final  MRSA PCR Screening     Status: None   Collection Time: 02/07/17  5:53 PM  Result Value Ref Range Status   MRSA by PCR NEGATIVE NEGATIVE Final    Comment:        The GeneXpert MRSA Assay (FDA approved for NASAL specimens only), is one component of a comprehensive MRSA colonization surveillance program. It is not intended to diagnose MRSA infection nor to guide or monitor treatment for MRSA infections.      Scheduled Meds: . chlorhexidine gluconate (MEDLINE KIT)  15 mL Mouth Rinse BID  . dorzolamide-timolol  1 drop Both Eyes BID  . insulin aspart  0-15 Units Subcutaneous Q4H  . ipratropium  0.5 mg Nebulization Q6H  . levalbuterol  0.63 mg Nebulization Q6H  . mouth rinse  15 mL Mouth Rinse QID  . pantoprazole (PROTONIX) IV  40 mg Intravenous Q24H     LOS: 11 days    Cherene Altes, MD [facility] Office  [phone] Pager - Text Page per Amion as per below:  On-Call/Text Page:      [email or web address]      password [email or web address]  If 7PM-7AM, please contact night-coverage [email or web address] Password [facility] 02/10/2017, 9:47 AM

## 2017-02-10 NOTE — Progress Notes (Signed)
VASCULAR LAB PRELIMINARY  PRELIMINARY  PRELIMINARY  PRELIMINARY  Carotid duplex completed.    Preliminary report:  Right ICA exhibits to be 80-99% stenosis.  1-39% left ICA stenosis.  Vertebral artery flow is antegrade.   Tuyen Uncapher, RVT 02/10/2017, 7:00 PM

## 2017-02-10 NOTE — Progress Notes (Signed)
ANTICOAGULATION CONSULT NOTE - Initial Consult  Pharmacy Consult for heparin Indication: atrial fibrillation  Allergies  Allergen Reactions  . Aspirin     Unknown, per pt  . Demerol     Unknown, per pt    Patient Measurements: Height: 5\' 9"  (175.3 cm) Weight: 249 lb 3.2 oz (113 kg) (bed scaqle) IBW/kg (Calculated) : 70.7 Heparin Dosing Weight: 95 kg  Vital Signs: Temp: 98.6 F (37 C) (08/19 1610) Temp Source: Oral (08/19 1610) BP: 125/72 (08/19 1800) Pulse Rate: 86 (08/19 1800)  Labs:  Recent Labs  02/08/17 0322 02/08/17 0332 02/09/17 0430 02/10/17 0450 02/10/17 1944  HGB 9.4*  --  8.5*  --   --   HCT 28.5*  --  25.9*  --   --   PLT 140*  --  159  --   --   LABPROT  --  17.3*  --   --   --   INR  --  1.41  --   --   --   HEPARINUNFRC  --   --   --   --  0.39  CREATININE 1.26*  --  0.94 0.85  --   TROPONINI 0.06*  --   --   --   --     Estimated Creatinine Clearance: 81.6 mL/min (by C-G formula based on SCr of 0.85 mg/dL).   Medical History: Past Medical History:  Diagnosis Date  . Aortic aneurysm (HCC)    Ascending thoracic - 4.8 cm by CT 2017  . Aortic regurgitation    Bioprosthetic AVR 2006  . Atrial fibrillation with controlled ventricular response ([place]) 08/22/2015   CHADS2VASC=5, on Coumadin  . CAD (coronary artery disease)    Multivessel status post CABG 2006  . Complication of anesthesia   . Esophageal reflux   . Essential hypertension   . History of dizziness   . Mixed hyperlipidemia   . Type 2 diabetes mellitus (HCC)     Assessment: History of AFib on warfarin pta PTA dose: 2.5 mg po q Mon/Wed/Fri, 5 mg all other days To start heparin gtt until surgery clears patient for warfarin. hgb 8.5, plts wnl  Heparin level therapeutic: 0.39 no bleeding documented  Goal of Therapy:  Heparin level 0.3-0.7 units/ml Monitor platelets by anticoagulation protocol: Yes    Plan:  -Continue heparin gtt at 1300 units/hr -Hold bolus -Daily HL, CBC   -Check level this evening   Wilkin Lippy L Flo [place]a 02/10/2017,8:20 PM

## 2017-02-11 ENCOUNTER — Encounter (HOSPITAL_COMMUNITY): Payer: Self-pay | Admitting: *Deleted

## 2017-02-11 DIAGNOSIS — I6310 Cerebral infarction due to embolism of unspecified precerebral artery: Secondary | ICD-10-CM

## 2017-02-11 DIAGNOSIS — I481 Persistent atrial fibrillation: Secondary | ICD-10-CM

## 2017-02-11 LAB — CBC
HEMATOCRIT: 27.2 % — AB (ref 39.0–52.0)
Hemoglobin: 8.7 g/dL — ABNORMAL LOW (ref 13.0–17.0)
MCH: 30.5 pg (ref 26.0–34.0)
MCHC: 32 g/dL (ref 30.0–36.0)
MCV: 95.4 fL (ref 78.0–100.0)
Platelets: 161 10*3/uL (ref 150–400)
RBC: 2.85 MIL/uL — ABNORMAL LOW (ref 4.22–5.81)
RDW: 13.7 % (ref 11.5–15.5)
WBC: 7.9 10*3/uL (ref 4.0–10.5)

## 2017-02-11 LAB — COMPREHENSIVE METABOLIC PANEL
ALT: 17 U/L (ref 17–63)
AST: 13 U/L — AB (ref 15–41)
Albumin: 2.4 g/dL — ABNORMAL LOW (ref 3.5–5.0)
Alkaline Phosphatase: 24 U/L — ABNORMAL LOW (ref 38–126)
Anion gap: 5 (ref 5–15)
BILIRUBIN TOTAL: 1 mg/dL (ref 0.3–1.2)
BUN: 12 mg/dL (ref 6–20)
CHLORIDE: 107 mmol/L (ref 101–111)
CO2: 27 mmol/L (ref 22–32)
CREATININE: 0.78 mg/dL (ref 0.61–1.24)
Calcium: 8.3 mg/dL — ABNORMAL LOW (ref 8.9–10.3)
GFR calc Af Amer: >60 mL/min (ref >60)
Glucose, Bld: 141 mg/dL — ABNORMAL HIGH (ref 65–99)
Potassium: 4.1 mmol/L (ref 3.5–5.1)
Sodium: 139 mmol/L (ref 135–145)
TOTAL PROTEIN: 5.4 g/dL — AB (ref 6.5–8.1)

## 2017-02-11 LAB — VAS US CAROTID
LEFT ECA DIAS: -4 cm/s
LEFT VERTEBRAL DIAS: -11 cm/s
LICAPDIAS: -34 cm/s
Left CCA dist dias: -17 cm/s
Left CCA dist sys: -80 cm/s
Left CCA prox dias: -18 cm/s
Left CCA prox sys: -129 cm/s
Left ICA dist dias: -19 cm/s
Left ICA dist sys: -87 cm/s
Left ICA prox sys: -103 cm/s
RCCAPDIAS: -14 cm/s
RIGHT ECA DIAS: -9 cm/s
RIGHT VERTEBRAL DIAS: 5 cm/s
Right CCA prox sys: -64 cm/s
Right cca dist sys: -236 cm/s

## 2017-02-11 LAB — LIPID PANEL
Cholesterol: 95 mg/dL (ref 0–200)
HDL: 35 mg/dL — ABNORMAL LOW (ref >40)
LDL CALC: 45 mg/dL (ref 0–99)
TRIGLYCERIDES: 76 mg/dL (ref <150)
Total CHOL/HDL Ratio: 2.7 RATIO
VLDL: 15 mg/dL (ref 0–40)

## 2017-02-11 LAB — GLUCOSE, CAPILLARY
GLUCOSE-CAPILLARY: 154 mg/dL — AB (ref 65–99)
GLUCOSE-CAPILLARY: 163 mg/dL — AB (ref 65–99)
GLUCOSE-CAPILLARY: 173 mg/dL — AB (ref 65–99)
Glucose-Capillary: 106 mg/dL — ABNORMAL HIGH (ref 65–99)

## 2017-02-11 LAB — HEPARIN LEVEL (UNFRACTIONATED): HEPARIN UNFRACTIONATED: 0.39 [IU]/mL (ref 0.30–0.70)

## 2017-02-11 MED ORDER — FENTANYL CITRATE (PF) 100 MCG/2ML IJ SOLN: 25.0000 ug | INTRAMUSCULAR | Status: DC | PRN

## 2017-02-11 MED ORDER — APIXABAN 5 MG PO TABS
5.0000 mg | ORAL_TABLET | Freq: Two times a day (BID) | ORAL | Status: DC
  Administered 2017-02-11 – 2017-02-14 (×6): 5 mg via ORAL
  Filled 2017-02-11 (×7): qty 1

## 2017-02-11 NOTE — Consult Note (Addendum)
[place] Nurse ostomy follow up Surgical team following for assessment and plan of care for abd wound. Stoma type/location: Colostomy stoma from surgery on 8/16 Stomal assessment/size: Stoma with yellow slough which removes easily; revealing 90% red, 10% yellow stoma, 1 1/2 inches, slightly above skin level. Peristomal assessment: Intact skin surrounding Output: Scant amt brown liquid in the pouch, no stool or flatus at this time.  Ostomy pouching: 1pc.  Education provided:  Demonstrated pouch change using one piece pouch and barrier ring to maintain a seal.  Pt watched procedure but did not participate.  No family present.  Will continue teaching sessions when pt is stable and out of ICU.  Supplies at the bedside for staff nurse use. Enrolled patient in [place] Start Discharge program: No Julien Girt MSN, [place], [place] Sam, [place]

## 2017-02-11 NOTE — Progress Notes (Addendum)
ANTICOAGULATION CONSULT NOTE - Initial Consult  Pharmacy Consult for Apixaban Indication: atrial fibrillation  Allergies  Allergen Reactions  . Aspirin     Unknown, per pt  . Demerol     Unknown, per pt   Estimated Creatinine Clearance: 86.7 mL/min (by C-G formula based on SCr of 0.78 mg/dL).   Medical History: Past Medical History:  Diagnosis Date  . Aortic aneurysm (HCC)    Ascending thoracic - 4.8 cm by CT 2017  . Aortic regurgitation    Bioprosthetic AVR 2006  . Atrial fibrillation with controlled ventricular response ([place]) 08/22/2015   CHADS2VASC=5, on Coumadin  . CAD (coronary artery disease)    Multivessel status post CABG 2006  . Complication of anesthesia   . Esophageal reflux   . Essential hypertension   . History of dizziness   . Mixed hyperlipidemia   . Type 2 diabetes mellitus (HCC)    Assessment: Mr. Binion is a 83 y/o male with a history of Afib, bioprosthetic aortic valve repair in '06, CAD, DM, and HTN. On 8/15 he underwent sigmoid colectomy with creation of descending colostomy on 8/16. After extubation, it was discovered that he suffered a CVA while his home warfarin was being held.  He is currently therapeutic on a heparin drip with the most recent level at 0.39. After discussion with the MD and neurology, apixaban will be initiated at this time. The heparin drip will be stopped at the first dose of apixaban and nursing is aware of the change.  Goal of Therapy:  Monitor platelets by anticoagulation protocol: Yes   Plan:  Start apixaban 5mg  po twice daily Stop heparin when apixaban is given Monitor for s/sx of bleeding or thromboembolism  Patterson Hammersmith PharmD PGY1 Pharmacy Practice Resident 02/11/2017 2:09 PM Pager: [phone]  I discussed / reviewed the pharmacy note by Dr. Linton Rump and I agree with the resident's findings and plans as documented.  Sloan Leiter, PharmD, BCPS Clinical Pharmacist

## 2017-02-11 NOTE — Progress Notes (Signed)
I met with pt, his wife, and daughter at bedside. We discussed the possibility of an inpt rehab admit pending bed availability when pt medically ready for discharge. They are in agreement and prefer an inpt rehab stay rather than SNF.  I will follow. [phone]

## 2017-02-11 NOTE — Evaluation (Signed)
Occupational Therapy Evaluation Patient Details Name: Terry Wu MRN: [id number] DOB: 1931/12/22 Today's Date: 02/11/2017    History of Present Illness This 83 y.o. male admitted to [facility] 01/30/17 with intractable diarrhea and dehydration.  Colonoscopy revealed adenocarcinoma.  He was transferred to [facility] 8/15 and underwent signoid colectomy with creation of descenting colostomy.   Post operatively he was found to have had CVA - MRI showed Lt MCA territory infarcts, and multiple old cerebellar infarcts.  PMH includes:  A-Fib. CAD, s/p CABG/AVR. DM, Aortic aneurysm   Clinical Impression   Pt admitted with above. He demonstrates the below listed deficits and will benefit from continued OT to maximize safety and independence with BADLs.  Pt presents to OT with mild Lt UE weakness, Lt visual field deficit (does not correlate to area of infarct - he does report he has h/o glaucoma and macular degeneration, but denies awareness of previous field deficit); generalized weakness, impaired balance, impaired cognition, pain.  He currently requires mod - max A for ADLs, and min A +2 for limited functional mobility.  Pt reports he was fully independent PTA, including driving.  Feel he would benefit from CIR prior to returning home with family.       Follow Up Recommendations  CIR;Supervision/Assistance - 24 hour    Equipment Recommendations  3 in 1 bedside commode    Recommendations for Other Services Rehab consult     Precautions / Restrictions Precautions Precautions: Fall      Mobility Bed Mobility Overal bed mobility: Needs Assistance Bed Mobility: Supine to Sit     Supine to sit: Mod assist;HOB elevated     General bed mobility comments: Pt requires cues for sequencing and assist to move LEs off EOB and to lift trunk   Transfers Overall transfer level: Needs assistance Equipment used: Rolling walker (2 wheeled) Transfers: Sit to/from [facility] Sit to Stand: Min  assist;+2 physical assistance;+2 safety/equipment Stand pivot transfers: Min assist;+2 physical assistance;+2 safety/equipment       General transfer comment: Pt requires assist to power up into standing and assist to steady     Balance Overall balance assessment: Needs assistance;History of Falls Sitting-balance support: Single extremity supported;Feet supported Sitting balance-Leahy Scale: Fair     Standing balance support: Bilateral upper extremity supported Standing balance-Leahy Scale: Poor Standing balance comment: Pt reliant on bil. UE support and min A                            ADL either performed or assessed with clinical judgement   ADL Overall ADL's : Needs assistance/impaired Eating/Feeding: NPO   Grooming: Wash/dry hands;Wash/dry face;Oral care;Brushing hair;Set up;Sitting   Upper Body Bathing: Minimal assistance;Sitting   Lower Body Bathing: Maximal assistance;Sit to/from stand   Upper Body Dressing : Moderate assistance;Sitting   Lower Body Dressing: Total assistance;Sit to/from stand   Toilet Transfer: Minimal assistance;+2 for physical assistance;+2 for safety/equipment;Stand-pivot;BSC;RW   Toileting- Clothing Manipulation and Hygiene: Maximal assistance;Sit to/from stand       Functional mobility during ADLs: Minimal assistance;+2 for physical assistance;+2 for safety/equipment;Rolling walker General ADL Comments: Pt limited by pain      Vision Baseline Vision/History: Wears glasses;Glaucoma;Macular Degeneration Wears Glasses: Reading only Patient Visual Report: No change from baseline Vision Assessment?: Yes Eye Alignment: Within Functional Limits Ocular Range of Motion: Within Functional Limits Alignment/Gaze Preference: Within Defined Limits Tracking/Visual Pursuits: Decreased smoothness of horizontal tracking;Decreased smoothness of eye movement to RIGHT superior field  Visual Fields: Left visual field deficit Additional Comments:  Pt noted to have difficulty locating therapist's hand on his Left when he was instructed to "squeeze my fingers".   He demonstrated Inconsistent results with confrontation testing on the Lt with frequent errors in central and superior quadrant.   He reports he has h/o glaucoma and macular degeneration both eyes, but is unsure if he has been diagnosed with a field deficit in the past.  Nystagmus with Rt central and superior gaze      Perception Perception Perception Tested?: Yes   Praxis Praxis Praxis tested?: Within functional limits    Pertinent Vitals/Pain Pain Assessment: Faces Faces Pain Scale: Hurts a little bit Pain Location: abdomen upon standing  Pain Descriptors / Indicators: Grimacing Pain Intervention(s): Monitored during session     Hand Dominance Right   Extremity/Trunk Assessment Upper Extremity Assessment Upper Extremity Assessment: LUE deficits/detail LUE Deficits / Details: Grossly 4/5 strength    Lower Extremity Assessment Lower Extremity Assessment: Defer to PT evaluation   Cervical / Trunk Assessment Cervical / Trunk Assessment: Kyphotic   Communication Communication Communication: Expressive difficulties   Cognition Arousal/Alertness: Awake/alert Behavior During Therapy: Flat affect Overall Cognitive Status: Impaired/Different from baseline Area of Impairment: Attention;Following commands;Awareness;Problem solving                   Current Attention Level: Selective   Following Commands: Follows multi-step commands with increased time   Awareness: Intellectual Problem Solving: Slow processing;Difficulty sequencing;Decreased initiation;Requires verbal cues     General Comments  VSS    Exercises     Shoulder Instructions      Home Living Family/patient expects to be discharged to:: Private residence Living Arrangements: Spouse/significant other Available Help at Discharge: Family;Available 24 hours/day Type of Home: House Home  Access: Level entry (into bottom level)     Home Layout: Multi-level Alternate Level Stairs-Number of Steps: 7 Alternate Level Stairs-Rails: Right Bathroom Shower/Tub: Occupational psychologist: Standard     Home Equipment: Environmental consultant - standard      Lives With: Spouse    Prior Functioning/Environment Level of Independence: Independent        Comments: Pt reports he was fully independent PTA, including driving.  He endorses fall in July         OT Problem List: Decreased strength;Decreased activity tolerance;Impaired balance (sitting and/or standing);Impaired vision/perception;Decreased cognition;Decreased safety awareness;Decreased knowledge of use of DME or AE;Cardiopulmonary status limiting activity;Obesity;Pain      OT Treatment/Interventions: Self-care/ADL training;Neuromuscular education;Therapeutic exercise;Energy conservation;DME and/or AE instruction;Therapeutic activities;Cognitive remediation/compensation;Visual/perceptual remediation/compensation;Patient/family education;Balance training    OT Goals(Current goals can be found in the care plan section) Acute Rehab OT Goals Patient Stated Goal: to go home  OT Goal Formulation: With patient Time For Goal Achievement: 02/25/17 Potential to Achieve Goals: Good ADL Goals Pt Will Perform Grooming: with min guard assist;standing Pt Will Perform Upper Body Bathing: with set-up;with supervision;sitting Pt Will Perform Lower Body Bathing: with min assist;with adaptive equipment;sit to/from stand Pt Will Perform Upper Body Dressing: with set-up;with supervision;sitting Pt Will Perform Lower Body Dressing: with min assist;with adaptive equipment;sit to/from stand Pt Will Transfer to Toilet: with min guard assist;ambulating;regular height toilet;bedside commode;grab bars Pt Will Perform Toileting - Clothing Manipulation and hygiene: with min guard assist;with adaptive equipment;sit to/from stand Additional ADL Goal #1: Pt  will locate ADLs items on Left with min cues   OT Frequency: Min 2X/week   Barriers to D/C:  Co-evaluation PT/OT/SLP Co-Evaluation/Treatment: Yes Reason for Co-Treatment: For patient/therapist safety;To address functional/ADL transfers   OT goals addressed during session: ADL's and self-care;Strengthening/ROM      AM-PAC PT "6 Clicks" Daily Activity     Outcome Measure Help from another person eating meals?: Total Help from another person taking care of personal grooming?: A Little Help from another person toileting, which includes using toliet, bedpan, or urinal?: A Lot Help from another person bathing (including washing, rinsing, drying)?: A Lot Help from another person to put on and taking off regular upper body clothing?: A Lot Help from another person to put on and taking off regular lower body clothing?: Total 6 Click Score: 11   End of Session Equipment Utilized During Treatment: Rolling walker;Gait belt Nurse Communication: Mobility status  Activity Tolerance: Patient limited by fatigue Patient left: in chair;with call bell/phone within reach;with chair alarm set  OT Visit Diagnosis: Unsteadiness on feet (R26.81);Cognitive communication deficit (R41.841) Symptoms and signs involving cognitive functions: Cerebral infarction                Time: 1859-0931 OT Time Calculation (min): 31 min Charges:  OT General Charges $OT Visit: 1 Procedure OT Evaluation $OT Eval Moderate Complexity: 1 Procedure G-Codes:     Lucille Passy, OTR/L [phone]   Lucille Passy M 02/11/2017, 12:43 PM

## 2017-02-11 NOTE — Progress Notes (Signed)
  Speech Language Pathology Treatment: Dysphagia  Patient Details Name: Terry Wu MRN: [id number] DOB: Dec 05, 1931 Today's Date: 02/11/2017 Time: 0076-2263 SLP Time Calculation (min) (ACUTE ONLY): 16 min  Assessment / Plan / Recommendation Clinical Impression  Pt had just finished his lunch meal and declined additional solids, but he consumed thin liquids via straw with eructation and immediate coughing noted. SLP attempted to bring his [facility] higher as it was very mildly reclined, but pt could not tolerate sitting much higher up. Coughing was mitigated with SLP intervention for straw removal as well as Min cues for smaller sips. His family and RN deny any coughing during previous meals when he was sitting in his chair. Recommend to continue current diet but with additional precautions including no straws and meds in puree. He would benefit from being OOB for his meals.   HPI HPI: 83 year old male with PMH significant for atrial fibrillation, CAD (CABG/AVR in 2006), DM, HTN, esophageal reflux and aortic aneurysm. He was admitted to [facility] 8/8 with intractable diarrhea and dehydration. He underwent GI workup including colonoscopy, which discovered a 4.4cm ulcerated friable mass and pathology deemed this to be adenocarcinoma. He was transferred to [facility] 8/15 and underwent sigmoid colectomy with creation of descending colostomy and suture repair of umbilical hernia on 3/35 and was considered to be encephalopathic following extubation on 02/08/17. MRI brain 02/08/17: Patchy acute small LEFT MCA territory infarcts and Multiple old cerebellar infarcts.       SLP Plan  Continue with current plan of care       Recommendations  Diet recommendations: Dysphagia 3 (mechanical soft);Thin liquid Liquids provided via: Cup;No straw Medication Administration: Whole meds with puree Supervision: Patient able to self feed;Intermittent supervision to cue for compensatory strategies Compensations:  Slow rate;Small sips/bites;Follow solids with liquid Postural Changes and/or Swallow Maneuvers: Seated upright 90 degrees;Upright 30-60 min after meal                Oral Care Recommendations: Oral care BID Follow up Recommendations: Inpatient Rehab SLP Visit Diagnosis: Dysphagia, unspecified (R13.10) Plan: Continue with current plan of care       GO                Germain Osgood 02/11/2017, 2:05 PM  Germain Osgood, M.A. CCC-SLP [phone]

## 2017-02-11 NOTE — Evaluation (Signed)
Physical Therapy Evaluation Patient Details Name: Terry Wu MRN: [id number] DOB: 11-08-1931 Today's Date: 02/11/2017   History of Present Illness  This 83 y.o. male admitted to [facility] 01/30/17 with intractable diarrhea and dehydration.  Colonoscopy revealed adenocarcinoma.  He was transferred to [facility] 8/15 and underwent signoid colectomy with creation of descenting colostomy.   Post operatively he was found to have had CVA - MRI showed Lt MCA territory infarcts, and multiple old cerebellar infarcts.  PMH includes:  A-Fib. CAD, s/p CABG/AVR. DM, Aortic aneurysm  Clinical Impression  Pt admitted with above diagnosis and presents to PT with functional limitations due to deficits listed below (See PT problem list). Pt needs skilled PT to maximize independence and safety to allow discharge to CIR. Pt with multiple medical events/complications during this hospitalization. Feel pt can benefit from a CIR stay prior to return home.     Follow Up Recommendations CIR    Equipment Recommendations  Other (comment) (To be assessed)    Recommendations for Other Services       Precautions / Restrictions Precautions Precautions: Fall Restrictions Weight Bearing Restrictions: No      Mobility  Bed Mobility Overal bed mobility: Needs Assistance Bed Mobility: Supine to Sit     Supine to sit: Mod assist;HOB elevated     General bed mobility comments: Pt requires cues for sequencing and assist to move LEs off EOB and to lift trunk  and to scoot hips to EOB  Transfers Overall transfer level: Needs assistance Equipment used: Rolling walker (2 wheeled) Transfers: Sit to/from [facility] Sit to Stand: Min assist;+2 physical assistance;+2 safety/equipment Stand pivot transfers: Min assist;+2 physical assistance;+2 safety/equipment       General transfer comment: Pt requires assist to power up into standing and assist to steady   Ambulation/Gait Ambulation/Gait assistance: +2  physical assistance;Min assist Ambulation Distance (Feet): 10 Feet Assistive device: Rolling walker (2 wheeled) Gait Pattern/deviations: Step-through pattern;Decreased step length - right;Decreased step length - left;Trunk flexed Gait velocity: decr Gait velocity interpretation: Below normal speed for age/gender General Gait Details: Assist for balance and support. Verbal cues to stand more erect.   Stairs            Wheelchair Mobility    Modified Rankin (Stroke Patients Only) Modified Rankin (Stroke Patients Only) Pre-Morbid Rankin Score: Moderate disability Modified Rankin: Moderately severe disability     Balance Overall balance assessment: Needs assistance;History of Falls Sitting-balance support: Single extremity supported;Feet supported Sitting balance-Leahy Scale: Fair     Standing balance support: Bilateral upper extremity supported Standing balance-Leahy Scale: Poor Standing balance comment: Pt reliant on bil. UE support and min A                              Pertinent Vitals/Pain Pain Assessment: Faces Faces Pain Scale: Hurts a little bit Pain Location: abdomen upon standing  Pain Descriptors / Indicators: Grimacing Pain Intervention(s): Limited activity within patient's tolerance;Monitored during session    Home Living Family/patient expects to be discharged to:: Private residence Living Arrangements: Spouse/significant other Available Help at Discharge: Family;Available 24 hours/day Type of Home: House Home Access: Level entry (into bottom level)     Home Layout: Multi-level Home Equipment: Walker - standard      Prior Function Level of Independence: Independent         Comments: Pt reports he was fully independent PTA, including driving.  He endorses fall in July  Hand Dominance   Dominant Hand: Right    Extremity/Trunk Assessment   Upper Extremity Assessment Upper Extremity Assessment: Defer to OT evaluation LUE  Deficits / Details: Grossly 4/5 strength     Lower Extremity Assessment Lower Extremity Assessment: Generalized weakness    Cervical / Trunk Assessment Cervical / Trunk Assessment: Kyphotic  Communication   Communication: Expressive difficulties  Cognition Arousal/Alertness: Awake/alert Behavior During Therapy: Flat affect Overall Cognitive Status: Impaired/Different from baseline Area of Impairment: Attention;Following commands;Awareness;Problem solving                   Current Attention Level: Selective   Following Commands: Follows multi-step commands with increased time   Awareness: Intellectual Problem Solving: Slow processing;Difficulty sequencing;Decreased initiation;Requires verbal cues        General Comments General comments (skin integrity, edema, etc.): vss    Exercises     Assessment/Plan    PT Assessment Patient needs continued PT services  PT Problem List Decreased strength;Decreased activity tolerance;Decreased balance;Decreased mobility;Decreased cognition;Decreased knowledge of use of DME       PT Treatment Interventions DME instruction;Gait training;Stair training;Functional mobility training;Therapeutic activities;Therapeutic exercise;Balance training;Neuromuscular re-education;Patient/family education;Cognitive remediation    PT Goals (Current goals can be found in the Care Plan section)  Acute Rehab PT Goals Patient Stated Goal: to go home  PT Goal Formulation: With patient Time For Goal Achievement: 02/18/17 Potential to Achieve Goals: Good    Frequency Min 3X/week   Barriers to discharge        Co-evaluation PT/OT/SLP Co-Evaluation/Treatment: Yes Reason for Co-Treatment: For patient/therapist safety PT goals addressed during session: Mobility/safety with mobility OT goals addressed during session: ADL's and self-care;Strengthening/ROM       AM-PAC PT "6 Clicks" Daily Activity  Outcome Measure Difficulty turning over in  bed (including adjusting bedclothes, sheets and blankets)?: Unable Difficulty moving from lying on back to sitting on the side of the bed? : Unable Difficulty sitting down on and standing up from a chair with arms (e.g., wheelchair, bedside commode, etc,.)?: Unable Help needed moving to and from a bed to chair (including a wheelchair)?: A Little Help needed walking in hospital room?: A Little Help needed climbing 3-5 steps with a railing? : Total 6 Click Score: 10    End of Session Equipment Utilized During Treatment: Gait belt Activity Tolerance: Patient tolerated treatment well;Patient limited by fatigue Patient left: in chair;with call bell/phone within reach;with chair alarm set Nurse Communication: Mobility status PT Visit Diagnosis: Unsteadiness on feet (R26.81);Other abnormalities of gait and mobility (R26.89);Muscle weakness (generalized) (M62.81)    Time: 3299-2426 PT Time Calculation (min) (ACUTE ONLY): 29 min   Charges:   PT Evaluation $PT Eval Moderate Complexity: 1 Mod     PT G CodesMarland Kitchen        [facility] PT [place] 02/11/2017, 2:13 PM

## 2017-02-11 NOTE — Consult Note (Signed)
Physical Medicine and Rehabilitation Consult Reason for Consult: Decreased functional mobility Referring Physician: [facility]   HPI: Terry Wu is a 83 y.o. right handed male with history of aortic aneurysm, atrial fibrillation, CAD with CABG/AVR maintained on chronic Coumadin, hypertension and type 2 diabetes mellitus. Per chart review patient lives with spouse. Independent prior to admission and still driving up until recently. Presented 01/30/2017 with diarrhea 2 weeks and increasing shortness of breath with ambulation. He denied any abdominal pain or rectal bleeding. CT of abdomen and pelvis showed no acute intra-abdominal or intrapelvic process identified. Clostridium difficile specimen negative. Underwent colonoscopy 02/05/2017 per gastroenterology that showed proximal sigmoid colon, mid sigmoid colon and descending colon to be normal. There was a malignant tumor in the distal sigmoid colon that was biopsied. Later underwent sigmoid colectomy with creation of descending colostomy after new diagnosis of adenocarcinoma per biopsy 02/07/2017 per general surgery. Patient was extubated 02/09/2007. MRI of the brain due to postoperative confusion showed patchy acute small left MCA territory infarcts. 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 MRA with no emergent large vessel occlusion. Carotid Dopplers had no ICA stenosis. His chronic Coumadin currently is on hold and was transitioned to Eliquis per follow-up of cardiology services. Tolerating a mechanical soft diet. Occupational therapy evaluation completed with recommendations of physical medicine rehabilitation consult.   Review of Systems  Constitutional: Negative for chills and fever.  HENT: Negative for hearing loss.   Eyes: Negative for blurred vision and double vision.  Respiratory: Positive for shortness of breath. Negative for cough.   Cardiovascular: Positive for palpitations and leg swelling. Negative for chest pain.  Gastrointestinal:  Positive for diarrhea and nausea.       GERD  Genitourinary: Negative for dysuria.  Musculoskeletal: Positive for joint pain and myalgias.  Skin: Negative for rash.  Neurological: Positive for dizziness and weakness. Negative for seizures.  All other systems reviewed and are negative.  Past Medical History:  Diagnosis Date  . Aortic aneurysm (HCC)    Ascending thoracic - 4.8 cm by CT 2017  . Aortic regurgitation    Bioprosthetic AVR 2006  . Atrial fibrillation with controlled ventricular response ([place]) 08/22/2015   CHADS2VASC=5, on Coumadin  . CAD (coronary artery disease)    Multivessel status post CABG 2006  . Complication of anesthesia   . Esophageal reflux   . Essential hypertension   . History of dizziness   . Mixed hyperlipidemia   . Type 2 diabetes mellitus ([place])    Past Surgical History:  Procedure Laterality Date  . AORTIC VALVE REPLACEMENT  2006   [facility] Pericardial tissue valve  . COLON RESECTION SIGMOID N/A 02/07/2017   Procedure: SIGMOID COLECTOMY WITH COLOSTOMY;  Surgeon: Donnie Mesa, MD;  Location: [facility];  Service: General;  Laterality: N/A;  . COLONOSCOPY N/A 02/05/2017   Procedure: COLONOSCOPY;  Surgeon: Rogene Houston, MD;  [facility];  Service: Endoscopy;  Laterality: N/A;  . CORONARY ARTERY BYPASS GRAFT  2006   LIMA to LAD, SVG to diagonal, SVG to OM, SVG to PDA   . HERNIA REPAIR    . TOTAL KNEE ARTHROPLASTY Bilateral   . UMBILICAL HERNIA REPAIR N/A 02/07/2017   Procedure: HERNIA REPAIR UMBILICAL ADULT;  Surgeon: Donnie Mesa, MD;  Location: [facility];  Service: General;  Laterality: N/A;   Family History  Problem Relation Age of Onset  . Heart Problems Mother   . Stroke Father   . Cancer Unknown    Social History:  reports that he has never smoked. He has never used smokeless tobacco. He reports that he does not drink alcohol or use drugs. Allergies:  Allergies  Allergen Reactions  . Aspirin     Unknown, per pt  . Demerol      Unknown, per pt   Medications Prior to Admission  Medication Sig Dispense Refill  . amLODipine (NORVASC) 10 MG tablet TAKE ONE TABLET BY MOUTH ONCE DAILY 90 tablet 1  . amoxicillin (AMOXIL) 500 MG capsule Take 2,000 mg by mouth as directed.     [name] atorvastatin (LIPITOR) 20 MG tablet TAKE ONE TABLET BY MOUTH ONCE DAILY 90 tablet 3  . carvedilol (COREG) 12.5 MG tablet TAKE ONE TABLET BY MOUTH TWICE DAILY 180 tablet 3  . Coenzyme Q10 (CO Q 10) 100 MG CAPS Take 1 capsule by mouth every morning.    . dorzolamide-timolol (COSOPT) 22.3-6.8 MG/ML ophthalmic solution Place 1 drop into both eyes 2 (two) times daily.    [name] glimepiride (AMARYL) 2 MG tablet Take 2 mg by mouth daily before breakfast.     . magnesium oxide (MAG-OX) 400 MG tablet Take 400 mg by mouth daily.    . Multiple Vitamins-Minerals (ICAPS PO) Take 2 tablets by mouth 3 (three) times daily after meals.     . pantoprazole (PROTONIX) 40 MG tablet Take 1 tablet (40 mg total) by mouth daily. 90 tablet 3  . potassium chloride SA (K-DUR,KLOR-CON) 20 MEQ tablet Take 20 mEq by mouth daily.     [name] spironolactone (ALDACTONE) 25 MG tablet TAKE ONE TABLET BY MOUTH ONCE DAILY 45 tablet 3  . telmisartan (MICARDIS) 40 MG tablet Take 40 mg by mouth daily.    [name] torsemide (DEMADEX) 100 MG tablet Take 50 mg by mouth daily.     [name] warfarin (COUMADIN) 5 MG tablet Take 1 tablet daily except 1/2 tablet on Mondays, Wednesdays and Fridays (Patient taking differently: Take 1 tablet daily except 1/2 tablet on sundays, Tuesday, thursday and saturdays) 30 tablet 12    Home: [place] expects to be discharged to:: Private residence Living Arrangements: Spouse/significant other Available Help at Discharge: Family, Available 24 hours/day Type of Home: House Home Access: Level entry (into bottom level) Home Layout: Multi-level Alternate Level Stairs-Number of Steps: 7 Alternate Level Stairs-Rails: Right Bathroom Shower/Tub: Tourist information centre manager: Standard Home Equipment: Environmental consultant - standard  Lives With: Spouse  Functional History: Prior Function Level of Independence: Independent Comments: Pt reports he was fully independent PTA, including driving.  He endorses fall in July  Functional Status:  Mobility: Bed Mobility Overal bed mobility: Needs Assistance Bed Mobility: Supine to Sit Supine to sit: Mod assist, HOB elevated Sit to supine: Min guard General bed mobility comments: Pt requires cues for sequencing and assist to move LEs off EOB and to lift trunk  Transfers Overall transfer level: Needs assistance Equipment used: Rolling walker (2 wheeled) Transfers: Sit to/from Stand, [employer] Transfers Sit to Stand: Min assist, +2 physical assistance, +2 safety/equipment Stand pivot transfers: Min assist, +2 physical assistance, +2 safety/equipment General transfer comment: Pt requires assist to power up into standing and assist to steady  Ambulation/Gait Ambulation/Gait assistance: Supervision Ambulation Distance (Feet): 10 Feet Assistive device: Standard walker Gait Pattern/deviations: Decreased step length - right, Decreased step length - left, Decreased dorsiflexion - left, Decreased dorsiflexion - right General Gait Details: ambulated within room, patient unwilling to ambulate in hallway today     ADL: ADL Overall ADL's : Needs assistance/impaired Eating/Feeding:  NPO Grooming: Wash/dry hands, Wash/dry face, Oral care, Brushing hair, Set up, Sitting Upper Body Bathing: Minimal assistance, Sitting Lower Body Bathing: Maximal assistance, Sit to/from stand Upper Body Dressing : Moderate assistance, Sitting Lower Body Dressing: Total assistance, Sit to/from stand Toilet Transfer: Minimal assistance, +2 for physical assistance, +2 for safety/equipment, Stand-pivot, BSC, RW Toileting- Clothing Manipulation and Hygiene: Maximal assistance, Sit to/from stand Functional mobility during ADLs: Minimal assistance, +2 for  physical assistance, +2 for safety/equipment, Rolling walker General ADL Comments: Pt limited by pain   Cognition: Cognition Overall Cognitive Status: Impaired/Different from baseline Arousal/Alertness: Awake/alert Orientation Level: Oriented X4 Attention: Sustained Sustained Attention: Appears intact Cognition Arousal/Alertness: Awake/alert Behavior During Therapy: Flat affect Overall Cognitive Status: Impaired/Different from baseline Area of Impairment: Attention, Following commands, Awareness, Problem solving Current Attention Level: Selective Following Commands: Follows multi-step commands with increased time Awareness: Intellectual Problem Solving: Slow processing, Difficulty sequencing, Decreased initiation, Requires verbal cues  Blood pressure 126/77, pulse 84, temperature (!) 97.5 F (36.4 C), temperature source Oral, resp. rate (!) 23, height 5\' 9"  (1.753 m), weight 113 kg (249 lb 3.2 oz), SpO2 98 %. Physical Exam  Vitals reviewed. HENT:  Head: Normocephalic.  Eyes: EOM are normal.  Neck: Normal range of motion. Neck supple. No thyromegaly present.  Cardiovascular:  Cardiac rate controlled  Respiratory: Effort normal and breath sounds normal. No respiratory distress.  GI: Soft. Bowel sounds are normal. He exhibits distension.  Colostomy in place  Neurological:  Mood is a bit flat but appropriate. Makes good eye contact and follows simple commands. Provides his name and age as well as place    Results for orders placed or performed during the hospital encounter of 01/30/17 (from the past 24 hour(s))  Glucose, capillary     Status: Abnormal   Collection Time: 02/10/17  4:08 PM  Result Value Ref Range   Glucose-Capillary 128 (H) 65 - 99 mg/dL   Comment 1 Capillary Specimen    Comment 2 Notify RN   Heparin level (unfractionated)     Status: None   Collection Time: 02/10/17  7:44 PM  Result Value Ref Range   Heparin Unfractionated 0.39 0.30 - 0.70 IU/mL  Glucose,  capillary     Status: Abnormal   Collection Time: 02/10/17  8:28 PM  Result Value Ref Range   Glucose-Capillary 151 (H) 65 - 99 mg/dL   Comment 1 Notify RN   Glucose, capillary     Status: Abnormal   Collection Time: 02/10/17 10:01 PM  Result Value Ref Range   Glucose-Capillary 138 (H) 65 - 99 mg/dL   Comment 1 Notify RN   Comprehensive metabolic panel     Status: Abnormal   Collection Time: 02/11/17  2:11 AM  Result Value Ref Range   Sodium 139 135 - 145 mmol/L   Potassium 4.1 3.5 - 5.1 mmol/L   Chloride 107 101 - 111 mmol/L   CO2 27 22 - 32 mmol/L   Glucose, Bld 141 (H) 65 - 99 mg/dL   BUN 12 6 - 20 mg/dL   Creatinine, Ser 0.78 0.61 - 1.24 mg/dL   Calcium 8.3 (L) 8.9 - 10.3 mg/dL   Total Protein 5.4 (L) 6.5 - 8.1 g/dL   Albumin 2.4 (L) 3.5 - 5.0 g/dL   AST 13 (L) 15 - 41 U/L   ALT 17 17 - 63 U/L   Alkaline Phosphatase 24 (L) 38 - 126 U/L   Total Bilirubin 1.0 0.3 - 1.2 mg/dL   GFR calc non  Af Amer >60 >60 mL/min   GFR calc Af Amer >60 >60 mL/min   Anion gap 5 5 - 15  Heparin level (unfractionated)     Status: None   Collection Time: 02/11/17  2:11 AM  Result Value Ref Range   Heparin Unfractionated 0.39 0.30 - 0.70 IU/mL  CBC     Status: Abnormal   Collection Time: 02/11/17  2:11 AM  Result Value Ref Range   WBC 7.9 4.0 - 10.5 K/uL   RBC 2.85 (L) 4.22 - 5.81 MIL/uL   Hemoglobin 8.7 (L) 13.0 - 17.0 g/dL   HCT 27.2 (L) 39.0 - 52.0 %   MCV 95.4 78.0 - 100.0 fL   MCH 30.5 26.0 - 34.0 pg   MCHC 32.0 30.0 - 36.0 g/dL   RDW 13.7 11.5 - 15.5 %   Platelets 161 150 - 400 K/uL  Glucose, capillary     Status: Abnormal   Collection Time: 02/11/17  8:35 AM  Result Value Ref Range   Glucose-Capillary 106 (H) 65 - 99 mg/dL  Glucose, capillary     Status: Abnormal   Collection Time: 02/11/17 11:41 AM  Result Value Ref Range   Glucose-Capillary 154 (H) 65 - 99 mg/dL   Comment 1 Capillary Specimen    [facility] Wo Contrast  Result Date: 02/10/2017 CLINICAL DATA:   Encephalopathy. Follow-up stroke. History of hypertension, hyperlipidemia, atrial fibrillation. EXAM: MRA HEAD WITHOUT CONTRAST MRA NECK WITHOUT CONTRAST TECHNIQUE: Angiographic images of the Circle of Willis were obtained using MRA technique without intravenous contrast. Angiographic images of the neck were obtained using MRA technique without intravenous contrast. Carotid stenosis measurements (when applicable) are obtained utilizing NASCET criteria, using the distal internal carotid diameter as the denominator. COMPARISON:  MRI of the head February 09, 2017 FINDINGS: MRA HEAD FINDINGS ANTERIOR CIRCULATION: Normal flow related enhancement of the included cervical, petrous, cavernous and supraclinoid internal carotid arteries. Patent anterior communicating artery. Patent anterior and middle cerebral arteries, including distal segments. No large vessel occlusion, high-grade stenosis, aneurysm. POSTERIOR CIRCULATION: LEFT vertebral artery is dominant. Basilar artery is patent, with normal flow related enhancement of the main branch vessels. Severe stenosis distal RIGHT V4 segment after the RIGHT posterior-inferior cerebellar artery takeoff. Moderate stenosis mid basilar artery. Patent posterior cerebral arteries. Robust LEFT posterior communicating artery present. No large vessel occlusion, high-grade stenosis,  aneurysm. ANATOMIC VARIANTS: None. Source images and MIP images were reviewed. MRA NECK FINDINGS- generalized poor signal ANTERIOR CIRCULATION: Due to time-of-flight technique arch vessel origins not characterize. Common carotid artery's of patent bilaterally. Potentially hemodynamically significant RIGHT internal carotid artery stenosis, not quantifiable. LEFT internal carotid artery appears widely patent. POSTERIOR CIRCULATION: Bilateral vertebral arteries are patent to the vertebrobasilar junction. No definite flow limiting stenosis or luminal irregularity. Source images and MIP images were reviewed.  IMPRESSION: MRI head: 1. No emergent large vessel occlusion. 2. No significant stenosis anterior circulation. Severe stenosis RIGHT V4 segment and moderate stenosis basilar artery. MRA NECK: 1. Technically limited. Suspected potentially hemodynamically significant stenosis RIGHT internal carotid artery origin. Electronically Signed   By: Elon Alas M.D.   On: 02/10/2017 05:03   [name] Neck Wo Contrast  Result Date: 02/10/2017 CLINICAL DATA:  Encephalopathy. Follow-up stroke. History of hypertension, hyperlipidemia, atrial fibrillation. EXAM: MRA HEAD WITHOUT CONTRAST MRA NECK WITHOUT CONTRAST TECHNIQUE: Angiographic images of the Circle of Willis were obtained using MRA technique without intravenous contrast. Angiographic images of the neck were obtained using MRA technique without intravenous contrast. Carotid stenosis  measurements (when applicable) are obtained utilizing NASCET criteria, using the distal internal carotid diameter as the denominator. COMPARISON:  MRI of the head February 09, 2017 FINDINGS: MRA HEAD FINDINGS ANTERIOR CIRCULATION: Normal flow related enhancement of the included cervical, petrous, cavernous and supraclinoid internal carotid arteries. Patent anterior communicating artery. Patent anterior and middle cerebral arteries, including distal segments. No large vessel occlusion, high-grade stenosis, aneurysm. POSTERIOR CIRCULATION: LEFT vertebral artery is dominant. Basilar artery is patent, with normal flow related enhancement of the main branch vessels. Severe stenosis distal RIGHT V4 segment after the RIGHT posterior-inferior cerebellar artery takeoff. Moderate stenosis mid basilar artery. Patent posterior cerebral arteries. Robust LEFT posterior communicating artery present. No large vessel occlusion, high-grade stenosis,  aneurysm. ANATOMIC VARIANTS: None. Source images and MIP images were reviewed. MRA NECK FINDINGS- generalized poor signal ANTERIOR CIRCULATION: Due to  time-of-flight technique arch vessel origins not characterize. Common carotid artery's of patent bilaterally. Potentially hemodynamically significant RIGHT internal carotid artery stenosis, not quantifiable. LEFT internal carotid artery appears widely patent. POSTERIOR CIRCULATION: Bilateral vertebral arteries are patent to the vertebrobasilar junction. No definite flow limiting stenosis or luminal irregularity. Source images and MIP images were reviewed. IMPRESSION: MRI head: 1. No emergent large vessel occlusion. 2. No significant stenosis anterior circulation. Severe stenosis RIGHT V4 segment and moderate stenosis basilar artery. MRA NECK: 1. Technically limited. Suspected potentially hemodynamically significant stenosis RIGHT internal carotid artery origin. Electronically Signed   By: Elon Alas M.D.   On: 02/10/2017 05:03    Assessment/Plan: Diagnosis: encephalopathy/debility after colectomy/colon cancer, multiple associated medical issues 1. Does the need for close, 24 hr/day medical supervision in concert with the patient's rehab needs make it unreasonable for this patient to be served in a less intensive setting? Yes 2. Co-Morbidities requiring supervision/potential complications: nutrition, wound care, copd 3. Due to bladder management, bowel management, safety, skin/wound care, disease management, medication administration, pain management and patient education, does the patient require 24 hr/day rehab nursing? Yes 4. Does the patient require coordinated care of a physician, rehab nurse, PT (1-2 hrs/day, 5 days/week), OT (1-2 hrs/day, 5 days/week) and SLP (1-2 hrs/day, 5 days/week) to address physical and functional deficits in the context of the above medical diagnosis(es)? Yes Addressing deficits in the following areas: balance, endurance, locomotion, strength, transferring, bowel/bladder control, bathing, dressing, feeding, grooming, toileting, cognition and psychosocial support 5. Can  the patient actively participate in an intensive therapy program of at least 3 hrs of therapy per day at least 5 days per week? Yes 6. The potential for patient to make measurable gains while on inpatient rehab is excellent 7. Anticipated functional outcomes upon discharge from inpatient rehab are modified independent and supervision  with PT, modified independent and supervision with OT, modified independent and supervision with SLP. 8. Estimated rehab length of stay to reach the above functional goals is: 10-15 days 9. Anticipated D/C setting: Home 10. Anticipated post D/C treatments: [place] therapy 11. Overall Rehab/Functional Prognosis: excellent  RECOMMENDATIONS: This patient's condition is appropriate for continued rehabilitative care in the following setting: CIR Patient has agreed to participate in recommended program. Yes Note that insurance prior authorization may be required for reimbursement for recommended care.  Comment: Rehab Admissions Coordinator to follow up.  Thanks,  Meredith Staggers, MD, Mellody Drown    Cathlyn Parsons., PA-C 02/11/2017

## 2017-02-11 NOTE — Progress Notes (Signed)
Rehab Admissions Coordinator Note:  Patient was screened by Retta Diones for appropriateness for an Inpatient Acute Rehab Consult.  At this time, we are recommending Inpatient Rehab consult.  Jodell Cipro M 02/11/2017, 1:15 PM  I can be reached at [phone].

## 2017-02-11 NOTE — Progress Notes (Signed)
STROKE TEAM PROGRESS NOTE   HISTORY OF PRESENT ILLNESS (per record) Terry Wu is an 83 y.o. male with a past medical history that is significant for hypertension, type two diabetes, CAD, hyperlipidemia, atrial fibrillation, prostate cancer and recent diagnosis and resection of rectosigmoid adenocarcinoma. He was transferred to [facility] from [facility] on 02/06/17 for removal of a large fungating and ulcerated nonobstructive colonic mass that was found on colonoscopy performed due to intractable diarrhea. Biopsy confirmed adenocarcinoma. Terry Wu underwent sigmoid colectomy with colostomy and hernia repair on the morning of 02/07/17. Following surgery, he was transferred from the postanesthesia care unit on peripheral vasopressor infusion for hypotension as Wu as precedex for sedation. Postoperatively, Terry Wu was in acute respiratory failure, but the PCCM note immediately preceding extubation stated that Terry Wu was awake, alert and in no distress. He was successfully extubated on the morning of 02/08/17   Neurology was consulted due to verbal reports of encephalopathy following extubation.  His wife and daughter were at bedside. His wife states that he is a naturally quiet and private man who was rather quiet before the surgery because the past few days have been a shock for him. She is surprised and pleased with his condition following surgery. She said, "Terry Wu" and thought he would be far less alert. She believes that his current state is because the sedating medications he was on over the past couple of days "have done a number" on him. She states that he has no known personal history of stroke or seizure, but that his father had a stroke.  On my visit, he was awake, alert (although appeared somnolent), and followed commands. We communicated through "yes" and "no" head movements. He acknowledged having a sore throat, which was painful to both speek and swallow. When he  attempted to speak, he often repeated, "tomorrow", to which his wife acknowledged telling him that they may be able to go home tomorrow. He stated that he was "not at home". He was unable to provide any orienting information.   SUBJECTIVE (INTERVAL HISTORY) No family is at the bedside.  Pt is awake alert, able to answer all questions and follows all commands. Fully oriented.  Carotid Doppler shows 80-99% right ICA stenosis which is asymptomatic as his infarcts are all in the left hemisphere.   OBJECTIVE Temp:  [97.5 F (36.4 C)-98.8 F (37.1 C)] 97.5 F (36.4 C) (08/20 1143) Pulse Rate:  [64-90] 81 (08/20 1400) Cardiac Rhythm: Atrial fibrillation (08/20 0729) Resp:  [18-30] 24 (08/20 1400) BP: (113-136)/(64-83) 134/78 (08/20 1400) SpO2:  [95 %-100 %] 98 % (08/20 1400)  CBC:  Recent Labs Lab 02/06/17 0627  02/07/17 2012  02/09/17 0430 02/11/17 0211  WBC 13.8*  --  14.0*  < > 10.7* 7.9  NEUTROABS 11.0*  --  12.6*  --   --   --   HGB 10.2*  < > 9.7*  < > 8.5* 8.7*  HCT 30.8*  < > 29.8*  < > 25.9* 27.2*  MCV 98.1  --  96.8  < > 93.8 95.4  PLT 191  --  170  < > 159 161  < > = values in this interval not displayed.  Basic Metabolic Panel:  Recent Labs Lab 02/07/17 2012  02/10/17 0450 02/11/17 0211  NA 134*  < > 139 139  K 4.7  < > 4.7 4.1  CL 99*  < > 108 107  CO2 27  < >  20* 27  GLUCOSE 140*  < > 99 141*  BUN 15  < > 13 12  CREATININE 1.19  < > 0.85 0.78  CALCIUM 8.4*  < > 8.5* 8.3*  MG 1.8  --   --   --   PHOS 3.3  --   --   --   < > = values in this interval not displayed.  Lipid Panel: No results found for: CHOL, TRIG, HDL, CHOLHDL, VLDL, LDLCALC HgbA1c:  Lab Results  Component Value Date   HGBA1C 6.4 (H) 02/10/2017   Urine Drug Screen: No results found for: LABOPIA, COCAINSCRNUR, LABBENZ, AMPHETMU, THCU, LABBARB  Alcohol Level No results found for: [place] I have personally reviewed the radiological images below and agree with the radiology  interpretations.  Ct Head Wo Contrast 02/08/2017 Atrophy with mild patchy periventricular small vessel disease. Prior infarct anterior superior right cerebellum. No intracranial mass, hemorrhage, or acute appearing infarct. No subdural or epidural fluid collections. Suspect small calcified granulomas without edema, stable. Areas of paranasal sinus disease. Chronic left-sided mastoid air cell disease. Foci of arterial vascular calcification noted at multiple sites.  Mr Terry Wu Head Wo Contrast Mr Terry Wu Neck Wo Contrast 02/10/2017  MRA head:  1. No emergent large vessel occlusion.  2. No significant stenosis anterior circulation. Severe stenosis RIGHT V4 segment and moderate stenosis basilar artery.   MRA NECK:  1. Technically limited. Suspected potentially hemodynamically significant stenosis RIGHT internal carotid artery origin.   CUS  Right ICA exhibits to be 80-99% stenosis.  1-39% left ICA stenosis.  Vertebral artery flow is antegrade.  Mr Brain Wo Contrast 02/09/2017 1. Patchy acute small LEFT MCA territory infarcts.  2. Multiple old cerebellar infarcts. Mild chronic small vessel ischemic disease.  3. 8 mm LEFT planum sphenoidale meningioma.   Dg Abd 2 Views 02/08/2017 The linear radiopaque foreign bodies in the left lower quadrant appear represent small clips. There also clips at the gastroesophageal junction. There is atherosclerotic calcification in the aorta. There are apparent seed implants in the prostate. No bowel dilatation or air-fluid level to suggest bowel obstruction. No free air. Aortic Atherosclerosis (ICD10-I70.0).   Dg Abd Portable 1v 02/08/2017 1. Imaging findings suggestive of postoperative ileus. No evidence for high-grade bowel obstruction.  2. Left lower quadrant linear radiodensity is of uncertain clinical significance. Small foreign body cannot be excluded. Recommend followup imaging with acute abdominal series including upright images.   Transthoracic  echocardiogram 02/08/2017 - Left ventricle: The cavity size was normal. Systolic function was   normal. The estimated ejection fraction was in the range of 55%   to 60%. Wall motion was normal; there were no regional wall   motion abnormalities. Doppler parameters are consistent with high   ventricular filling pressure. - Aortic valve: A 64mm Edwards pericradial bioprosthesis was   present. - Mitral valve: Transvalvular velocity was within the normal range.   There was no evidence for stenosis. There was no regurgitation. - Left atrium: The atrium was severely dilated. - Right ventricle: The cavity size was normal. Wall thickness was   normal. Systolic function was normal. - Right atrium: The atrium was moderately dilated. - Tricuspid valve: There was mild regurgitation. - Pulmonary arteries: PA peak pressure: 42 mm Hg (S). Impressions: Compared with the echo 08/22/15, the ascending aorta has increased from 4.4 to 4.6 cm.  Bioprosthetic aortic valve gradient has increased from 10 mmHg to 12 mmHg.   PHYSICAL EXAM Vitals:   02/11/17 1143 02/11/17 1200 02/11/17  1300 02/11/17 1400  BP:  132/79 126/77 134/78  Pulse:  84 84 81  Resp:  (!) 27 (!) 23 (!) 24  Temp: (!) 97.5 F (36.4 C)     TempSrc: Oral     SpO2:  98% 98% 98%  Weight:      Height:        Temp:  [97.5 F (36.4 C)-98.8 F (37.1 C)] 97.5 F (36.4 C) (08/20 1143) Pulse Rate:  [64-90] 81 (08/20 1400) Resp:  [18-30] 24 (08/20 1400) BP: (113-136)/(64-83) 134/78 (08/20 1400) SpO2:  [95 %-100 %] 98 % (08/20 1400)  General - Wu nourished, Wu developed,Not in distress  Ophthalmologic - Fundi not visualized due to noncooperation.  Cardiovascular -  no murmur or gallop  Mental Status -  Level of arousal and oriented to time, place, and person were intact. Language including expression, naming, repetition, comprehension was assessed and found intact. Fund of Knowledge was assessed and was intact.  Cranial Nerves II  - XII - II - Visual field intact OU. III, IV, VI - Extraocular movements intact. V - Facial sensation intact bilaterally. VII - Facial movement intact bilaterally. VIII - Hearing & vestibular intact bilaterally. X - Palate elevates symmetrically,  XI - Chin turning & shoulder shrug intact bilaterally. XII - Tongue protrusion intact.  Motor Strength - The patient's strength was 4/5 BUEs and 3/5 BLEs and pronator drift was absent.  Bulk was normal and fasciculations were absent.   Motor Tone - Muscle tone was assessed at the neck and appendages and was normal.  Reflexes - The patient's reflexes were 1+ in all extremities and he had no pathological reflexes.  Sensory - Light touch, temperature/pinprick, vibration and proprioception were assessed and were symmetrical.    Coordination - The patient had normal movements in the hands and feet with no ataxia or dysmetria.  Tremor was absent.  Gait and Station - deferred   ASSESSMENT/PLAN Terry Wu is a 83 y.o. male with history of hypertension, diabetes mellitus, coronary artery disease, hyperlipidemia, atrial fibrillation (on Coumadin PTA), prostate cancer, and recent resection of rectosigmoid adenocarcinoma presenting with encephalopathy following extubation. He did not receive IV t-PA due to recent surgery.  Strokes: Patchy acute small LEFT MCA and MCA/ACA territory infarcts - likely embolic from afib off anticoagulation for surgery. High-grade right internal carotid artery stenosis likely asymptomatic and incidental  Resultant  Generalized weakness and lethargy  CT head - Atrophy with mild patchy periventricular small vessel disease. Prior infarct anterior superior right cerebellum.  MRI Head -  Patchy acute small LEFT MCA territory infarcts. Multiple old cerebellar infarcts.  MRA Head - No emergent large vessel occlusion.   MRA Neck - Suspected potentially hemodynamically significant stenosis RIGHT internal carotid artery  origin, however, very low quality - will do CUS to evaluate  Carotid Doppler - Right ICA exhibits to be 80-99% stenosis.  1-39% left ICA stenosis.  Vertebral artery flow is antegrade. 2D Echo - 55% to 60%. No cardiac source of emboli identified.  LDL - pending (? Cancelled )  HgbA1c - 6.4  VTE prophylaxis - SCDs DIET DYS 3 Room service appropriate? Yes; Fluid consistency: Thin  warfarin daily prior to admission, now on No antithrombotic. Pt has ASA allergy. Due to small size of infarcts, recommend heparin IV for anticoagulation if ok from surgical standpoint. Eventually pt needs to transition to po anticoagulation. Although he has AVR and not FDA-approved for DOACs, recent studies all show DOACs are equally effective  in valvular afib. Also, pt likely needs intermittent surgeries or procedures due to his malignancy, DOACs are better in management of anticoagulation than coumadin. Pt is in agreement. Discussed with Dr. Thereasa Solo by Dr Erlinda Hong.   Patient counseled to be compliant with his antithrombotic medications  Ongoing aggressive stroke risk factor management  Therapy recommendations:CLR Disposition: CLR Afib on coumadin PTA  Off coumadin due to surgery  Hemodynamically stable  Recommend to resume anticoagulation with heparin as the strokes are small in size  Recommend Eliquis for anticoagulation. Although he has AVR and not FDA-approved for DOACs, recent studies all show DOACs are equally effective in valvular afib. Also, pt likely needs intermittent surgeries or procedures due to his malignancy, DOACs are better in management of anticoagulation than coumadin.   Large sigmoid colon cancer  S/p resection  Surgery is following   Hemodynamically stable  Hypertension  Stable  Permissive hypertension (OK if < 180/105) but gradually normalize in 5-7 days  Long-term BP goal normotensive  Hyperlipidemia  Home meds:  Lipitor 20 mg daily resumed in hospital  LDL pending (? Cancelled  ? Why), goal < 70  Continue lipitor on discharge.  Diabetes  HgbA1c 6.4, goal < 7.0  Controlled  SSI  CBG monitoring  PCP follow up  Other Stroke Risk Factors  Advanced age  Obesity, Body mass index is 36.8 kg/m., recommend weight loss, diet and exercise as appropriate   Hx stroke/TIA by imaging - right cerebellum  Family hx stroke (father)  Coronary artery disease  Other Active Problems  Reported ASA allergy - lip swollen  Leukocytosis - improving - afebrile  The ascending aorta has increased from 4.4 to 4.6 cm. by echo   Hospital day # 12 Plan start eliquis for anticoagulation. High-grade right internal carotid artery stenosis is asymptomatic hence we will follow this conservatively for now. Aggressive risk factor modification. Check lipid profile and continue Lipitor. No family available for discussion. Discussed with Dr. Thereasa Solo. Transfer patient to neurology floor bed and mobilize out of bed as tolerated and transferred to inpatient rehabilitation over the next few days if stable. This patient is critically ill due to left MCA infarct, afib RVR, large colon cancer s/p surgery, not on AC, s/p AVR and at significant risk of neurological worsening, death form recurrent strokes, hemorrhagic transformation, DVT, seizure. This patient's care requires constant monitoring of vital signs, hemodynamics, respiratory and cardiac monitoring, review of multiple databases, neurological assessment, discussion with family, other specialists and medical decision making of high complexity. I spent 35 minutes of neurocritical care time in the care of this patient.  Antony Contras, MD Stroke Neurology 02/11/2017 2:43 PM    To contact Stroke Continuity provider, please refer to [email or web address] After hours, contact General Neurology

## 2017-02-11 NOTE — Progress Notes (Signed)
4 Days Post-Op   Subjective/Chief Complaint: On diet, had some air in bag per nursing, no real complaints, no n/v, speaking well   Objective: Vital signs in last 24 hours: Temp:  [97.5 F (36.4 C)-98.8 F (37.1 C)] 98.5 F (36.9 C) (08/20 0836) Pulse Rate:  [64-90] 82 (08/20 0843) Resp:  [18-30] 28 (08/20 0700) BP: (113-137)/(64-119) 134/73 (08/20 0843) SpO2:  [95 %-100 %] 97 % (08/20 0800) Last BM Date: 02/10/17  Intake/Output from previous day: 08/19 0701 - 08/20 0700 In: 641.4 [I.V.:641.4] Out: 755 [Urine:730; Stool:25] Intake/Output this shift: Total I/O In: 23 [I.V.:23] Out: -   GI: obese, approp tender wound clean without infection stoma pink nothing in bag but was just changed  Lab Results:   Recent Labs  02/09/17 0430 02/11/17 0211  WBC 10.7* 7.9  HGB 8.5* 8.7*  HCT 25.9* 27.2*  PLT 159 161   BMET  Recent Labs  02/10/17 0450 02/11/17 0211  NA 139 139  K 4.7 4.1  CL 108 107  CO2 20* 27  GLUCOSE 99 141*  BUN 13 12  CREATININE 0.85 0.78  CALCIUM 8.5* 8.3*   PT/INR No results for input(s): LABPROT, INR in the last 72 hours. ABG No results for input(s): PHART, HCO3 in the last 72 hours.  Invalid input(s): PCO2, PO2  Studies/Results: Mr Virgel Paling HU Contrast  Result Date: 02/10/2017 CLINICAL DATA:  Encephalopathy. Follow-up stroke. History of hypertension, hyperlipidemia, atrial fibrillation. EXAM: MRA HEAD WITHOUT CONTRAST MRA NECK WITHOUT CONTRAST TECHNIQUE: Angiographic images of the Circle of Willis were obtained using MRA technique without intravenous contrast. Angiographic images of the neck were obtained using MRA technique without intravenous contrast. Carotid stenosis measurements (when applicable) are obtained utilizing NASCET criteria, using the distal internal carotid diameter as the denominator. COMPARISON:  MRI of the head February 09, 2017 FINDINGS: MRA HEAD FINDINGS ANTERIOR CIRCULATION: Normal flow related enhancement of the included  cervical, petrous, cavernous and supraclinoid internal carotid arteries. Patent anterior communicating artery. Patent anterior and middle cerebral arteries, including distal segments. No large vessel occlusion, high-grade stenosis, aneurysm. POSTERIOR CIRCULATION: LEFT vertebral artery is dominant. Basilar artery is patent, with normal flow related enhancement of the main branch vessels. Severe stenosis distal RIGHT V4 segment after the RIGHT posterior-inferior cerebellar artery takeoff. Moderate stenosis mid basilar artery. Patent posterior cerebral arteries. Robust LEFT posterior communicating artery present. No large vessel occlusion, high-grade stenosis,  aneurysm. ANATOMIC VARIANTS: None. Source images and MIP images were reviewed. MRA NECK FINDINGS- generalized poor signal ANTERIOR CIRCULATION: Due to time-of-flight technique arch vessel origins not characterize. Common carotid artery's of patent bilaterally. Potentially hemodynamically significant RIGHT internal carotid artery stenosis, not quantifiable. LEFT internal carotid artery appears widely patent. POSTERIOR CIRCULATION: Bilateral vertebral arteries are patent to the vertebrobasilar junction. No definite flow limiting stenosis or luminal irregularity. Source images and MIP images were reviewed. IMPRESSION: MRI head: 1. No emergent large vessel occlusion. 2. No significant stenosis anterior circulation. Severe stenosis RIGHT V4 segment and moderate stenosis basilar artery. MRA NECK: 1. Technically limited. Suspected potentially hemodynamically significant stenosis RIGHT internal carotid artery origin. Electronically Signed   By: Elon Alas M.D.   On: 02/10/2017 05:03   [name] Neck Wo Contrast  Result Date: 02/10/2017 CLINICAL DATA:  Encephalopathy. Follow-up stroke. History of hypertension, hyperlipidemia, atrial fibrillation. EXAM: MRA HEAD WITHOUT CONTRAST MRA NECK WITHOUT CONTRAST TECHNIQUE: Angiographic images of the Circle of Willis were  obtained using MRA technique without intravenous contrast. Angiographic images of the neck were obtained  using MRA technique without intravenous contrast. Carotid stenosis measurements (when applicable) are obtained utilizing NASCET criteria, using the distal internal carotid diameter as the denominator. COMPARISON:  MRI of the head February 09, 2017 FINDINGS: MRA HEAD FINDINGS ANTERIOR CIRCULATION: Normal flow related enhancement of the included cervical, petrous, cavernous and supraclinoid internal carotid arteries. Patent anterior communicating artery. Patent anterior and middle cerebral arteries, including distal segments. No large vessel occlusion, high-grade stenosis, aneurysm. POSTERIOR CIRCULATION: LEFT vertebral artery is dominant. Basilar artery is patent, with normal flow related enhancement of the main branch vessels. Severe stenosis distal RIGHT V4 segment after the RIGHT posterior-inferior cerebellar artery takeoff. Moderate stenosis mid basilar artery. Patent posterior cerebral arteries. Robust LEFT posterior communicating artery present. No large vessel occlusion, high-grade stenosis,  aneurysm. ANATOMIC VARIANTS: None. Source images and MIP images were reviewed. MRA NECK FINDINGS- generalized poor signal ANTERIOR CIRCULATION: Due to time-of-flight technique arch vessel origins not characterize. Common carotid artery's of patent bilaterally. Potentially hemodynamically significant RIGHT internal carotid artery stenosis, not quantifiable. LEFT internal carotid artery appears widely patent. POSTERIOR CIRCULATION: Bilateral vertebral arteries are patent to the vertebrobasilar junction. No definite flow limiting stenosis or luminal irregularity. Source images and MIP images were reviewed. IMPRESSION: MRI head: 1. No emergent large vessel occlusion. 2. No significant stenosis anterior circulation. Severe stenosis RIGHT V4 segment and moderate stenosis basilar artery. MRA NECK: 1. Technically limited.  Suspected potentially hemodynamically significant stenosis RIGHT internal carotid artery origin. Electronically Signed   By: Elon Alas M.D.   On: 02/10/2017 05:03    Anti-infectives: Anti-infectives    Start     Dose/Rate Route Frequency Ordered Stop   02/07/17 1418  cefoTEtan in Dextrose 5% (CEFOTAN) 2-2.08 GM-% IVPB  Status:  Discontinued    Comments:  Schonewitz, Leigh   : cabinet override      02/07/17 1418 02/07/17 1519   02/07/17 1015  cefoTEtan (CEFOTAN) 1 g in dextrose 5 % 50 mL IVPB     1 g 100 mL/hr over 30 Minutes Intravenous  Once 02/07/17 1012 02/07/17 1241      Assessment/Plan: Large sigmoid (rectosigmoid junction) colon cancer POD#4 S/p Hartmann's procedure and suture repair of umbilical hernia 0/63 Dr. Donnie Mesa - put on dys 3 diet yesterday and tolerated well, this might be a little early but can follow for now - Mobilize to chair and with PT - incentive spirometry   -pathology pending FEN: dys 3 diet VTE: SCD's, heparin drip for stroke concer Follow up: staple removal between 8/27-8/30, post-op F/U Dr. Georgette Dover 3-4 weeks.  [facility] 02/11/2017

## 2017-02-11 NOTE — Progress Notes (Addendum)
[place] TEAM 1 - Stepdown/ICU TEAM  Terry Wu  HRV:[id number] DOB: January 29, 1932 DOA: 01/30/2017 PCP: Kari Baars, MD    Brief Narrative:   83yo M with a hx of atrial fibrillation, CAD (CABG/AVR in 2006), DM, HTN, and aortic aneurysm who was admitted to [facility] 8/8 with intractable diarrhea and dehydration. He was having more than 12 stools per day. He underwent a colonoscopy, which discovered a 4.4cm ulcerated friable mass and pathology deemed this to be adenocarcinoma. Of note he also has a large protruding umbilical hernia and large gallstones. He was transferred to [facility] 8/15 and underwent sigmoid colectomy with creation of descending colostomy on 8/16. Postoperatively he was recovered to the medical ICU and remained on ventilator. Upon extubation, he was discovered to have suffered a CVA.    Significant Events: 8/8 admit 8/15 transfer to [facility] 8/16 colectomy with colostomy - remained intubated - required short term pressors  8/17 extubated - found to have word finding difficulty - MRI notes CVA  Subjective: Pt is much more conversant.  He is oriented to place and time and situation.  He denies any complaints whatsoever, and tells me he is anxious to get out of here.    Assessment & Plan:  Large sigmoid colon cancer status post Hartman's procedure and suture repair of umbilical hernia 02/07/17 Ongoing care per General Surgery - appears to be doing well   Large chronically incarcerated umbilical hernia Corrected at time of sigmoid colon cancer surgery  Left acute MCA infarcts 8/17 Neurology suggests initiating aspirin therapy for now, w/ eventual resumption of warfarin - unfortunately the pt reports "swelling of the mouth" when he takes ASA - has persisting word finding difficulty - IV heparin initiated 8/19 w/o bolus - transition to warfarin prior to d/c if no bleeding complications on heparin   Acute hypoxic respiratory failure in postoperative setting Has now been extubated  and is stable from respiratory standpoint - wean O2 as able   Dysphagia Due to CVA - SLP has cleared for diet - he is tolerating it thus far   Acute kidney injury Resolved with creatinine now normal  Chronic Atrial fibrillation Chadsvasc is now 7 - was on warfarin at time of admission - unable to utilize aspirin at present due to allergy - cont heparin gtt and transition when proven stable on anticoag   CAD status post CABG 2006 No c/o chest pain  Cholelithiasis  Aortic valve replacement 2006 - bioprosthetic   DM2 CBG currently well-controlled  History of prostate cancer status post radioactive seed placement  DVT prophylaxis: SCDs Code Status: FULL CODE Family Communication: no family present at time of exam  Disposition Plan: transfer to tele bed - PT/OT - wound care per Surgery   Consultants:  PCCM Gen Surgery  Antimicrobials:  none  Objective: Blood pressure 134/73, pulse 82, temperature 98.5 F (36.9 C), temperature source Oral, resp. rate (!) 28, height 5\' 9"  (1.753 m), weight 113 kg (249 lb 3.2 oz), SpO2 97 %.  Intake/Output Summary (Last 24 hours) at 02/11/17 0857 Last data filed at 02/11/17 0800  Gross per 24 hour  Intake           664.42 ml  Output              655 ml  Net             9.42 ml   Filed Weights   02/05/17 0600 02/06/17 0620 02/07/17 0614  Weight: 114 kg (251 lb  4.8 oz) 112.9 kg (248 lb 14.4 oz) 113 kg (249 lb 3.2 oz)    Examination: General: No acute respiratory distress - alert  Lungs: CTA th/o - no wheezing  Cardiovascular: RRR w/o M or rub  Abdomen: Nontender, nondistended, soft, wounds dressed and dry, ostomy appears benign Extremities: trace B LE edema w/o signif change   CBC:  Recent Labs Lab 02/04/17 1540  02/06/17 0627 02/07/17 1631 02/07/17 2012 02/08/17 0322 02/09/17 0430 02/11/17 0211  WBC 7.4  < > 13.8*  --  14.0* 13.2* 10.7* 7.9  NEUTROABS 5.1  --  11.0*  --  12.6*  --   --   --   HGB 10.0*  < > 10.2* 8.2*  9.7* 9.4* 8.5* 8.7*  HCT 29.5*  < > 30.8* 24.0* 29.8* 28.5* 25.9* 27.2*  MCV 95.5  < > 98.1  --  96.8 95.3 93.8 95.4  PLT 183  < > 191  --  170 140* 159 161  < > = values in this interval not displayed. Basic Metabolic Panel:  Recent Labs Lab 02/07/17 2012 02/08/17 0322 02/09/17 0430 02/10/17 0450 02/11/17 0211  NA 134* 134* 138 139 139  K 4.7 4.6 4.3 4.7 4.1  CL 99* 99* 104 108 107  CO2 '27 26 27 '$ 20* 27  GLUCOSE 140* 142* 138* 99 141*  BUN '15 18 19 13 12  '$ CREATININE 1.19 1.26* 0.94 0.85 0.78  CALCIUM 8.4* 8.5* 8.5* 8.5* 8.3*  MG 1.8  --   --   --   --   PHOS 3.3  --   --   --   --    GFR: Estimated Creatinine Clearance: 86.7 mL/min (by C-G formula based on SCr of 0.78 mg/dL).  Liver Function Tests:  Recent Labs Lab 02/07/17 2012 02/08/17 0322 02/11/17 0211  AST 20 18 13*  ALT '24 23 17  '$ ALKPHOS 29* 31* 24*  BILITOT 1.0 1.2 1.0  PROT 5.8* 6.0* 5.4*  ALBUMIN 2.8* 2.6* 2.4*   Coagulation Profile:  Recent Labs Lab 02/05/17 0607 02/06/17 0627 02/07/17 0506 02/07/17 2012 02/08/17 0332  INR 1.45 1.37 1.35 1.32 1.41    Cardiac Enzymes:  Recent Labs Lab 02/07/17 2012 02/08/17 0322  TROPONINI 0.07* 0.06*    HbA1C: Hgb A1c MFr Bld  Date/Time Value Ref Range Status  02/10/2017 09:49 AM 6.4 (H) 4.8 - 5.6 % Final    Comment:    (NOTE) Pre diabetes:          5.7%-6.4% Diabetes:              >6.4% Glycemic control for   <7.0% adults with diabetes   08/23/2015 01:50 AM 6.3 (H) 4.8 - 5.6 % Final    Comment:    (NOTE)         Pre-diabetes: 5.7 - 6.4         Diabetes: >6.4         Glycemic control for adults with diabetes: <7.0     CBG:  Recent Labs Lab 02/10/17 1203 02/10/17 1608 02/10/17 2028 02/10/17 2201 02/11/17 0835  GLUCAP 101* 128* 151* 138* 106*    Recent Results (from the past 240 hour(s))  Urine Culture     Status: None   Collection Time: 02/04/17  8:23 AM  Result Value Ref Range Status   Specimen Description URINE, CLEAN CATCH   Final   Special Requests NONE  Final   Culture   Final    NO GROWTH Performed at [facility]  [facility], [place] [address], [place], [place] [address]    Report Status 02/05/2017 FINAL  Final  MRSA PCR Screening     Status: None   Collection Time: 02/07/17  5:53 PM  Result Value Ref Range Status   MRSA by PCR NEGATIVE NEGATIVE Final    Comment:        The GeneXpert MRSA Assay (FDA approved for NASAL specimens only), is one component of a comprehensive MRSA colonization surveillance program. It is not intended to diagnose MRSA infection nor to guide or monitor treatment for MRSA infections.      Scheduled Meds: . atorvastatin  20 mg Oral q1800  . carvedilol  3.125 mg Oral BID WC  . chlorhexidine gluconate (MEDLINE KIT)  15 mL Mouth Rinse BID  . dorzolamide-timolol  1 drop Both Eyes BID  . insulin aspart  0-5 Units Subcutaneous QHS  . insulin aspart  0-9 Units Subcutaneous TID WC  . ipratropium  0.5 mg Nebulization TID  . levalbuterol  0.63 mg Nebulization TID  . mouth rinse  15 mL Mouth Rinse QID  . pantoprazole  40 mg Oral Q1200     LOS: 12 days   Cherene Altes, MD [facility] Office  [phone] Pager - Text Page per Amion as per below:  On-Call/Text Page:      [email or web address]      password [email or web address]  If 7PM-7AM, please contact night-coverage [email or web address] Password [facility] 02/11/2017, 8:57 AM

## 2017-02-12 DIAGNOSIS — C61 Malignant neoplasm of prostate: Secondary | ICD-10-CM

## 2017-02-12 DIAGNOSIS — K420 Umbilical hernia with obstruction, without gangrene: Secondary | ICD-10-CM

## 2017-02-12 DIAGNOSIS — K8000 Calculus of gallbladder with acute cholecystitis without obstruction: Secondary | ICD-10-CM

## 2017-02-12 DIAGNOSIS — E119 Type 2 diabetes mellitus without complications: Secondary | ICD-10-CM

## 2017-02-12 DIAGNOSIS — I272 Pulmonary hypertension, unspecified: Secondary | ICD-10-CM

## 2017-02-12 DIAGNOSIS — C187 Malignant neoplasm of sigmoid colon: Secondary | ICD-10-CM

## 2017-02-12 DIAGNOSIS — I63512 Cerebral infarction due to unspecified occlusion or stenosis of left middle cerebral artery: Secondary | ICD-10-CM

## 2017-02-12 LAB — COMPREHENSIVE METABOLIC PANEL
ALBUMIN: 2.3 g/dL — AB (ref 3.5–5.0)
ALT: 17 U/L (ref 17–63)
ANION GAP: 3 — AB (ref 5–15)
AST: 13 U/L — AB (ref 15–41)
Alkaline Phosphatase: 25 U/L — ABNORMAL LOW (ref 38–126)
BUN: 11 mg/dL (ref 6–20)
CHLORIDE: 106 mmol/L (ref 101–111)
CO2: 29 mmol/L (ref 22–32)
Calcium: 8.3 mg/dL — ABNORMAL LOW (ref 8.9–10.3)
Creatinine, Ser: 0.76 mg/dL (ref 0.61–1.24)
GFR calc Af Amer: >60 mL/min (ref >60)
GFR calc non Af Amer: >60 mL/min (ref >60)
GLUCOSE: 166 mg/dL — AB (ref 65–99)
POTASSIUM: 4.3 mmol/L (ref 3.5–5.1)
SODIUM: 138 mmol/L (ref 135–145)
Total Bilirubin: 0.8 mg/dL (ref 0.3–1.2)
Total Protein: 5.3 g/dL — ABNORMAL LOW (ref 6.5–8.1)

## 2017-02-12 LAB — GLUCOSE, CAPILLARY
GLUCOSE-CAPILLARY: 131 mg/dL — AB (ref 65–99)
GLUCOSE-CAPILLARY: 171 mg/dL — AB (ref 65–99)
Glucose-Capillary: 150 mg/dL — ABNORMAL HIGH (ref 65–99)
Glucose-Capillary: 185 mg/dL — ABNORMAL HIGH (ref 65–99)

## 2017-02-12 LAB — CBC
HCT: 26.4 % — ABNORMAL LOW (ref 39.0–52.0)
Hemoglobin: 8.5 g/dL — ABNORMAL LOW (ref 13.0–17.0)
MCH: 30.8 pg (ref 26.0–34.0)
MCHC: 32.2 g/dL (ref 30.0–36.0)
MCV: 95.7 fL (ref 78.0–100.0)
PLATELETS: 168 10*3/uL (ref 150–400)
RBC: 2.76 MIL/uL — ABNORMAL LOW (ref 4.22–5.81)
RDW: 13.8 % (ref 11.5–15.5)
WBC: 7 10*3/uL (ref 4.0–10.5)

## 2017-02-12 MED ORDER — FUROSEMIDE 10 MG/ML IJ SOLN
40.0000 mg | Freq: Every day | INTRAMUSCULAR | Status: DC
  Administered 2017-02-12 – 2017-02-14 (×3): 40 mg via INTRAVENOUS
  Filled 2017-02-12 (×3): qty 4

## 2017-02-12 NOTE — Progress Notes (Signed)
[facility] Progress Note  5 Days Post-Op  Subjective: CC:  Resting comfortably. Denies abdominal pain, early satiety, and distention. Denies nausea/ vomiting. Tolerating diet. OOB to chair and ambulated yesterday.   Per nurse - 200 cc stool emptied from colostomy pouch at end of 3rd shift. Objective: Vital signs in last 24 hours: Temp:  [97 F (36.1 C)-98.6 F (37 C)] 98.6 F (37 C) (08/21 0745) Pulse Rate:  [77-91] 85 (08/21 0600) Resp:  [22-33] 28 (08/21 0600) BP: (112-136)/(66-114) 118/66 (08/21 0600) SpO2:  [92 %-100 %] 92 % (08/21 0600) Last BM Date: 02/11/17  Intake/Output from previous day: 08/20 0701 - 08/21 0700 In: 267.2 [I.V.:267.2] Out: 450 [Urine:450] Intake/Output this shift: No intake/output data recorded.  PE: Gen:  Alert, NAD, pleasant and cooperative Pulm:  Normal effort Abd: Soft, protuberant, non-tender,  bowel sounds present all 4 quadrants, midline staples c/d/i, stoma pink and viable, colostomy pouch with minimal amt gas and some serosanguinous drainage.  Skin: warm and dry, no rashes  Psych: A&Ox3   Lab Results:   Recent Labs  02/11/17 0211 02/12/17 0307  WBC 7.9 7.0  HGB 8.7* 8.5*  HCT 27.2* 26.4*  PLT 161 168   BMET  Recent Labs  02/11/17 0211 02/12/17 0307  NA 139 138  K 4.1 4.3  CL 107 106  CO2 27 29  GLUCOSE 141* 166*  BUN 12 11  CREATININE 0.78 0.76  CALCIUM 8.3* 8.3*   CMP     Component Value Date/Time   NA 138 02/12/2017 0307   K 4.3 02/12/2017 0307   CL 106 02/12/2017 0307   CO2 29 02/12/2017 0307   GLUCOSE 166 (H) 02/12/2017 0307   BUN 11 02/12/2017 0307   CREATININE 0.76 02/12/2017 0307   CREATININE 0.97 09/22/2015 1139   CALCIUM 8.3 (L) 02/12/2017 0307   PROT 5.3 (L) 02/12/2017 0307   ALBUMIN 2.3 (L) 02/12/2017 0307   AST 13 (L) 02/12/2017 0307   ALT 17 02/12/2017 0307   ALKPHOS 25 (L) 02/12/2017 0307   BILITOT 0.8 02/12/2017 0307   GFRNONAA >60 02/12/2017 0307   GFRAA >60 02/12/2017 0307    Anti-infectives: Anti-infectives    Start     Dose/Rate Route Frequency Ordered Stop   02/07/17 1418  cefoTEtan in Dextrose 5% (CEFOTAN) 2-2.08 GM-% IVPB  Status:  Discontinued    Comments:  Schonewitz, Leigh   : cabinet override      02/07/17 1418 02/07/17 1519   02/07/17 1015  cefoTEtan (CEFOTAN) 1 g in dextrose 5 % 50 mL IVPB     1 g 100 mL/hr over 30 Minutes Intravenous  Once 02/07/17 1012 02/07/17 1241     Assessment/Plan POD#5 S/p Hartmann's procedure and suture repair of umbilical hernia 4/03 Dr. Donnie Mesa - put on dys 3 diet yesterday and tolerated well, coninue to follow. Would not advance past current diet today unless bowel function increases. - 1 BM noted yesterday - per nurse 200 cc, per epic 25 cc.     - Mobilize to chair and with PT - incentive spirometry  - pathology pending  FEN: dys 3 diet VTE: SCD's, heparin drip for stroke concern Follow up: staple removal between 8/27-8/30, post-op F/U Dr. Georgette Dover 3-4 weeks.   LOS: 13 days    Jill Alexanders , [facility] 02/12/2017, 7:48 AM Pager: [phone] Consults: [phone] Mon-Fri 7:00 am-4:30 pm Sat-Sun 7:00 am-11:30 am

## 2017-02-12 NOTE — Progress Notes (Signed)
STROKE TEAM PROGRESS NOTE   HISTORY OF PRESENT ILLNESS (per record) LEQUAN DOBRATZ is an 83 y.o. male with a past medical history that is significant for hypertension, type two diabetes, CAD, hyperlipidemia, atrial fibrillation, prostate cancer and recent diagnosis and resection of rectosigmoid adenocarcinoma. He was transferred to [facility] from [facility] on 02/06/17 for removal of a large fungating and ulcerated nonobstructive colonic mass that was found on colonoscopy performed due to intractable diarrhea. Biopsy confirmed adenocarcinoma. Mr. Bergland underwent sigmoid colectomy with colostomy and hernia repair on the morning of 02/07/17. Following surgery, he was transferred from the postanesthesia care unit on peripheral vasopressor infusion for hypotension as well as precedex for sedation. Postoperatively, Mr. Morawski was in acute respiratory failure, but the PCCM note immediately preceding extubation stated that Mr. Jessop was awake, alert and in no distress. He was successfully extubated on the morning of 02/08/17   Neurology was consulted due to verbal reports of encephalopathy following extubation.  His wife and daughter were at bedside. His wife states that he is a naturally quiet and private man who was rather quiet before the surgery because the past few days have been a shock for him. She is surprised and pleased with his condition following surgery. She said, "[name] don't do drugs so well" and thought he would be far less alert. She believes that his current state is because the sedating medications he was on over the past couple of days "have done a number" on him. She states that he has no known personal history of stroke or seizure, but that his father had a stroke.  On my visit, he was awake, alert (although appeared somnolent), and followed commands. We communicated through "yes" and "no" head movements. He acknowledged having a sore throat, which was painful to both speek and swallow. When he  attempted to speak, he often repeated, "tomorrow", to which his wife acknowledged telling him that they may be able to go home tomorrow. He stated that he was "not at home". He was unable to provide any orienting information.   SUBJECTIVE (INTERVAL HISTORY) No family is at the bedside.  Pt is awake and having a bath and able to answer all questions and follows all commands.  LDL cholesterol is 40 mg percent.  OBJECTIVE Temp:  [97 F (36.1 C)-98.6 F (37 C)] 98.4 F (36.9 C) (08/21 1147) Pulse Rate:  [70-92] 76 (08/21 1100) Cardiac Rhythm: Atrial fibrillation (08/21 0800) Resp:  [20-33] 20 (08/21 1100) BP: (112-135)/(66-114) 126/69 (08/21 0810) SpO2:  [92 %-100 %] 99 % (08/21 1100)  CBC:  Recent Labs Lab 02/06/17 0627  02/07/17 2012  02/11/17 0211 02/12/17 0307  WBC 13.8*  --  14.0*  < > 7.9 7.0  NEUTROABS 11.0*  --  12.6*  --   --   --   HGB 10.2*  < > 9.7*  < > 8.7* 8.5*  HCT 30.8*  < > 29.8*  < > 27.2* 26.4*  MCV 98.1  --  96.8  < > 95.4 95.7  PLT 191  --  170  < > 161 168  < > = values in this interval not displayed.  Basic Metabolic Panel:  Recent Labs Lab 02/07/17 2012  02/11/17 0211 02/12/17 0307  NA 134*  < > 139 138  K 4.7  < > 4.1 4.3  CL 99*  < > 107 106  CO2 27  < > 27 29  GLUCOSE 140*  < > 141* 166*  BUN  15  < > 12 11  CREATININE 1.19  < > 0.78 0.76  CALCIUM 8.4*  < > 8.3* 8.3*  MG 1.8  --   --   --   PHOS 3.3  --   --   --   < > = values in this interval not displayed.  Lipid Panel:     Component Value Date/Time   CHOL 95 02/11/2017 1453   TRIG 76 02/11/2017 1453   HDL 35 (L) 02/11/2017 1453   CHOLHDL 2.7 02/11/2017 1453   VLDL 15 02/11/2017 1453   LDLCALC 45 02/11/2017 1453   HgbA1c:  Lab Results  Component Value Date   HGBA1C 6.4 (H) 02/10/2017   Urine Drug Screen: No results found for: LABOPIA, COCAINSCRNUR, LABBENZ, AMPHETMU, THCU, LABBARB  Alcohol Level No results found for: [place] I have personally reviewed the radiological  images below and agree with the radiology interpretations.  Ct Head Wo Contrast 02/08/2017 Atrophy with mild patchy periventricular small vessel disease. Prior infarct anterior superior right cerebellum. No intracranial mass, hemorrhage, or acute appearing infarct. No subdural or epidural fluid collections. Suspect small calcified granulomas without edema, stable. Areas of paranasal sinus disease. Chronic left-sided mastoid air cell disease. Foci of arterial vascular calcification noted at multiple sites.  [name] Head Wo Contrast [name] Neck Wo Contrast 02/10/2017  MRA head:  1. No emergent large vessel occlusion.  2. No significant stenosis anterior circulation. Severe stenosis RIGHT V4 segment and moderate stenosis basilar artery.   MRA NECK:  1. Technically limited. Suspected potentially hemodynamically significant stenosis RIGHT internal carotid artery origin.   CUS  Right ICA exhibits to be 80-99% stenosis.  1-39% left ICA stenosis.  Vertebral artery flow is antegrade.  Mr Brain Wo Contrast 02/09/2017 1. Patchy acute small LEFT MCA territory infarcts.  2. Multiple old cerebellar infarcts. Mild chronic small vessel ischemic disease.  3. 8 mm LEFT planum sphenoidale meningioma.   Dg Abd 2 Views 02/08/2017 The linear radiopaque foreign bodies in the left lower quadrant appear represent small clips. There also clips at the gastroesophageal junction. There is atherosclerotic calcification in the aorta. There are apparent seed implants in the prostate. No bowel dilatation or air-fluid level to suggest bowel obstruction. No free air. Aortic Atherosclerosis (ICD10-I70.0).   Dg Abd Portable 1v 02/08/2017 1. Imaging findings suggestive of postoperative ileus. No evidence for high-grade bowel obstruction.  2. Left lower quadrant linear radiodensity is of uncertain clinical significance. Small foreign body cannot be excluded. Recommend followup imaging with acute abdominal series including upright  images.   Transthoracic echocardiogram 02/08/2017 - Left ventricle: The cavity size was normal. Systolic function was   normal. The estimated ejection fraction was in the range of 55%   to 60%. Wall motion was normal; there were no regional wall   motion abnormalities. Doppler parameters are consistent with high   ventricular filling pressure. - Aortic valve: A 63mm Edwards pericradial bioprosthesis was   present. - Mitral valve: Transvalvular velocity was within the normal range.   There was no evidence for stenosis. There was no regurgitation. - Left atrium: The atrium was severely dilated. - Right ventricle: The cavity size was normal. Wall thickness was   normal. Systolic function was normal. - Right atrium: The atrium was moderately dilated. - Tricuspid valve: There was mild regurgitation. - Pulmonary arteries: PA peak pressure: 42 mm Hg (S). Impressions: Compared with the echo 08/22/15, the ascending aorta has increased from 4.4 to 4.6 cm.  Bioprosthetic aortic valve gradient has increased from 10 mmHg to 12 mmHg.   PHYSICAL EXAM Vitals:   02/12/17 0900 02/12/17 1000 02/12/17 1100 02/12/17 1147  BP:      Pulse: 83 92 76   Resp: (!) 26 (!) 32 20   Temp:    98.4 F (36.9 C)  TempSrc:    Oral  SpO2: 98% 97% 99%   Weight:      Height:        Temp:  [97 F (36.1 C)-98.6 F (37 C)] 98.4 F (36.9 C) (08/21 1147) Pulse Rate:  [70-92] 76 (08/21 1100) Resp:  [20-33] 20 (08/21 1100) BP: (112-135)/(66-114) 126/69 (08/21 0810) SpO2:  [92 %-100 %] 99 % (08/21 1100)  General - Well nourished, well developed,Not in distress  Ophthalmologic - Fundi not visualized due to noncooperation.  Cardiovascular -  no murmur or gallop.Regular heart sounds  Mental Status -  Level of arousal and oriented to time, place, and person were intact. Language including expression, naming, repetition, comprehension was assessed and found intact. Fund of Knowledge was assessed and was  intact.  Cranial Nerves II - XII - II - Visual field intact OU. III, IV, VI - Extraocular movements intact. V - Facial sensation intact bilaterally. VII - Facial movement intact bilaterally. VIII - Hearing & vestibular intact bilaterally. X - Palate elevates symmetrically,  XI - Chin turning & shoulder shrug intact bilaterally. XII - Tongue protrusion intact.  Motor Strength - The patient's strength was 4/5 BUEs and 3/5 BLEs and pronator drift was absent.  Bulk was normal and fasciculations were absent.   Motor Tone - Muscle tone was assessed at the neck and appendages and was normal.  Reflexes - The patient's reflexes were 1+ in all extremities and he had no pathological reflexes.  Sensory - Light touch, temperature/pinprick, vibration and proprioception were assessed and were symmetrical.    Coordination - The patient had normal movements in the hands and feet with no ataxia or dysmetria.  Tremor was absent.  Gait and Station - deferred   ASSESSMENT/PLAN Mr. AMER ALCINDOR is a 83 y.o. male with history of hypertension, diabetes mellitus, coronary artery disease, hyperlipidemia, atrial fibrillation (on Coumadin PTA), prostate cancer, and recent resection of rectosigmoid adenocarcinoma presenting with encephalopathy following extubation. He did not receive IV t-PA due to recent surgery.  Strokes: Patchy acute small LEFT MCA and MCA/ACA territory infarcts - likely embolic from afib off anticoagulation for surgery. High-grade right internal carotid artery stenosis likely asymptomatic and incidental  Resultant  Generalized weakness and lethargy  CT head - Atrophy with mild patchy periventricular small vessel disease. Prior infarct anterior superior right cerebellum.  MRI Head -  Patchy acute small LEFT MCA territory infarcts. Multiple old cerebellar infarcts.  MRA Head - No emergent large vessel occlusion.   MRA Neck - Suspected potentially hemodynamically significant stenosis  RIGHT internal carotid artery origin, however, very low quality - will do CUS to evaluate  Carotid Doppler - Right ICA exhibits to be 80-99% stenosis.  1-39% left ICA stenosis.  Vertebral artery flow is antegrade. 2D Echo - 55% to 60%. No cardiac source of emboli identified.  LDL - pending (? Cancelled )  HgbA1c - 6.4  VTE prophylaxis - SCDs DIET DYS 3 Room service appropriate? Yes; Fluid consistency: Thin  warfarin daily prior to admission, now on No antithrombotic. Pt has ASA allergy. Due to small size of infarcts, recommend heparin IV for anticoagulation if ok from surgical standpoint. Eventually  pt needs to transition to po anticoagulation. Although he has AVR and not FDA-approved for DOACs, recent studies all show DOACs are equally effective in valvular afib. Also, pt likely needs intermittent surgeries or procedures due to his malignancy, DOACs are better in management of anticoagulation than coumadin. Pt is in agreement. Discussed with Dr. Thereasa Solo by Dr Erlinda Hong.   Patient counseled to be compliant with his antithrombotic medications  Ongoing aggressive stroke risk factor management  Therapy recommendations:CLR Disposition: CLR Afib on coumadin PTA  Off coumadin due to surgery  Hemodynamically stable  Recommend to resume anticoagulation with heparin as the strokes are small in size  Recommend Eliquis for anticoagulation. Although he has AVR and not FDA-approved for DOACs, recent studies all show DOACs are equally effective in valvular afib. Also, pt likely needs intermittent surgeries or procedures due to his malignancy, DOACs are better in management of anticoagulation than coumadin.   Large sigmoid colon cancer  S/p resection  Surgery is following   Hemodynamically stable  Hypertension  Stable  Permissive hypertension (OK if < 180/105) but gradually normalize in 5-7 days  Long-term BP goal normotensive  Hyperlipidemia  Home meds:  Lipitor 20 mg daily resumed in  hospital  LDL pending (? Cancelled ? Why), goal < 70  Continue lipitor on discharge.  Diabetes  HgbA1c 6.4, goal < 7.0  Controlled  SSI  CBG monitoring  PCP follow up  Other Stroke Risk Factors  Advanced age  Obesity, Body mass index is 36.8 kg/m., recommend weight loss, diet and exercise as appropriate   Hx stroke/TIA by imaging - right cerebellum  Family hx stroke (father)  Coronary artery disease  Other Active Problems  Reported ASA allergy - lip swollen  Leukocytosis - improving - afebrile  The ascending aorta has increased from 4.4 to 4.6 cm. by echo   Hospital day # 13 Plan continue eliquis for anticoagulation. High-grade right internal carotid artery stenosis is asymptomatic hence we will follow this conservatively for now. Aggressive risk factor modification. Continue Lipitor. No family available for discussion. Discussed with Dr.Woods. Transfer patient to neurology floor bed and mobilize out of bed as tolerated and transferred to inpatient rehabilitation over the next few days if stable. Stroke team will sign off. Kindly call for questions. Greater than 50% and in this 25 minute visit was spent on counseling and coordination of care about his embolic stroke, atrial fibrillation and  Asymptomatic  carotid stenosis discussion  Antony Contras, MD Stroke Neurology 02/12/2017 12:46 PM    To contact Stroke Continuity provider, please refer to [email or web address] After hours, contact General Neurology

## 2017-02-12 NOTE — Clinical Social Work Note (Signed)
Clinical Social Work Assessment  Patient Details  Name: Terry Wu MRN: [id number] Date of Birth: [birth year]  Date of referral:  02/12/17               Reason for consult:  Facility Placement                Permission sought to share information with:  Family Supports Permission granted to share information::  Yes, Verbal Permission Granted  Name::     Gabriel Rainwater   Agency::     Relationship::     Contact Information:     Housing/Transportation Living arrangements for the past 2 months:  Single Family Home (with wife and daughter. ) Source of Information:  Spouse (pt's wife. ) Patient Interpreter Needed:  None Criminal Activity/Legal Involvement Pertinent to Current Situation/Hospitalization:  No - Comment as needed Significant Relationships:  Adult Children, Spouse Lives with:  Spouse, Adult Children Do you feel safe going back to the place where you live?  Yes Need for family participation in patient care:  Yes (Comment)  Care giving concerns:  CSW was unable to speak with pt due to being intubated at this time. No concerns were expressed to CSW at this time.    Social Worker assessment / plan:  CSW spoke with pt's wife [place] via phone. During this time CSW was informed that pt is still intubated. Pt's wife went on to inform CSW that pt and her have lived together for 3 years and have one daughter who is staying in the home with pt and wife. Pt's wife is interested in rheab here at [facility] at this time, however CSW was consulted to do SNF placement as a back up to pt not being able to go to [facility].   Employment status:  Retired Forensic scientist:  Medicare PT Recommendations:  [place] / Referral to community resources:     Patient/Family's Response to care:  Pt's wife is understanding and agreeable to plan of care at this time.  Patient/Family's Understanding of and Emotional Response to Diagnosis, Current Treatment, and Prognosis:  No  further questions or concerns have been presented at this time.   Emotional Assessment Appearance:    Attitude/Demeanor/Rapport:  Unable to Assess Affect (typically observed):  Unable to Assess (pt is intubated. ) Orientation:   (pt is currently intubated. ) Alcohol / Substance use:  Not Applicable Psych involvement (Current and /or in the community):  No (Comment)  Discharge Needs  Concerns to be addressed:  No discharge needs identified Readmission within the last 30 days:  No Current discharge risk:  None Barriers to Discharge:  No Barriers Identified   Wetzel Bjornstad, [place] 02/12/2017, 2:32 PM

## 2017-02-12 NOTE — NC FL2 (Signed)
[place] LEVEL OF CARE SCREENING TOOL     IDENTIFICATION  Patient Name: Terry Wu Birthdate: April 15, 1932 Sex: male Admission Date (Current Location): 01/30/2017  [facility] and [place] Number:  Herbalist and Address:  [facility], [place] [address], [place], [place] [address]      Provider Number: [id number]  Attending Physician Name and Address:  Allie Bossier, MD  Relative Name and Phone Number:       Current Level of Care: Hospital Recommended Level of Care: [place] Prior Approval Number:    Date Approved/Denied:   PASRR Number:   [id number]   Discharge Plan: SNF    Current Diagnoses: Patient Active Problem List   Diagnosis Date Noted  . Cerebral embolism with cerebral infarction 02/10/2017  . Colostomy in place [facility])   . Persistent atrial fibrillation ([place])   . Acute respiratory failure ([place])   . Acute respiratory failure with hypoxia ([place])   . S/P abdominal surgery, follow-up exam   . Preoperative cardiovascular examination   . Chronic diastolic heart failure ([place]) 02/01/2017  . Dehydration 02/01/2017  . Acute kidney injury ([place]) 02/01/2017  . Diarrhea 01/30/2017  . Gait difficulty 08/22/2015  . Dizziness 08/22/2015  . Chronic atrial fibrillation ([place]) 08/22/2015  . Aortic aneurysm ([place]) 02/01/2014  . CAP (community acquired pneumonia) 02/12/2013  . Hyponatremia 02/12/2013  . COPD (chronic obstructive pulmonary disease) ([place]) 02/12/2013  . Bilateral leg edema 02/12/2013  . DIZZINESS 01/26/2010  . SHORTNESS OF BREATH 10/29/2008  . Diabetes type 2, controlled ([place]) 07/23/2008  . Mixed hyperlipidemia 07/23/2008  . HYPERTENSION, BENIGN 07/23/2008  . ESOPHAGEAL REFLUX 07/23/2008  . S/P AVR 07/23/2008  . CORONARY ARTERY BYPASS GRAFT, HX OF 07/23/2008    Orientation RESPIRATION BLADDER Height & Weight      (pt is currently intubated at this time. )  O2 (Nasal Cannula ) Continent Weight:  249 lb 3.2 oz (113 kg) (bed scaqle) Height:  '5\' 9"'$  (175.3 cm)  BEHAVIORAL SYMPTOMS/MOOD NEUROLOGICAL BOWEL NUTRITION STATUS      Continent Diet (see discharge summary. )  AMBULATORY STATUS COMMUNICATION OF NEEDS Skin   Limited Assist Verbally Surgical wounds (Ecchymosis)                       Personal Care Assistance Level of Assistance  Bathing, Feeding, Dressing Bathing Assistance: Maximum assistance Feeding assistance: Limited assistance Dressing Assistance: Maximum assistance     Functional Limitations Info  Sight, Hearing, Speech Sight Info: Adequate Hearing Info: Adequate Speech Info: Adequate    SPECIAL CARE FACTORS FREQUENCY  PT (By licensed PT), OT (By licensed OT)     PT Frequency: 3 times a week  OT Frequency: 2 times a week             Contractures Contractures Info: Not present    Additional Factors Info  Code Status, Allergies (pt also has type 2 diabetes. ) Code Status Info: Full Code  Allergies Info: Aspirin, Demerol           Current Medications (02/12/2017):  This is the current hospital active medication list Current Facility-Administered Medications  Medication Dose Route Frequency Provider Last Rate Last Dose  . 0.9 %  sodium chloride infusion   Intravenous Continuous Cherene Altes, MD 10 mL/hr at 02/12/17 1200    . apixaban (ELIQUIS) tablet 5 mg  5 mg Oral BID Cherene Altes, MD   5 mg at 02/12/17  2224  . atorvastatin (LIPITOR) tablet 20 mg  20 mg Oral q1800 Rosalin Hawking, MD   20 mg at 02/11/17 1810  . carvedilol (COREG) tablet 3.125 mg  3.125 mg Oral BID WC Cherene Altes, MD   3.125 mg at 02/12/17 0810  . chlorhexidine gluconate (MEDLINE KIT) (PERIDEX) 0.12 % solution 15 mL  15 mL Mouth Rinse BID Javier Glazier, MD   15 mL at 02/12/17 1146  . dorzolamide-timolol (COSOPT) 22.3-6.8 MG/ML ophthalmic solution 1 drop  1 drop Both Eyes BID Sinda Du, MD   1 drop at 02/12/17 [phone]  . fentaNYL (SUBLIMAZE) injection 25-50 mcg   25-50 mcg Intravenous Q2H PRN Joette Catching T, MD      . insulin aspart (novoLOG) injection 0-5 Units  0-5 Units Subcutaneous QHS Joette Catching T, MD      . insulin aspart (novoLOG) injection 0-9 Units  0-9 Units Subcutaneous TID WC Cherene Altes, MD   1 Units at 02/12/17 1155  . levalbuterol (XOPENEX) nebulizer solution 0.63 mg  0.63 mg Nebulization Q3H PRN Cherene Altes, MD      . MEDLINE mouth rinse  15 mL Mouth Rinse QID Javier Glazier, MD   15 mL at 02/12/17 0552  . pantoprazole (PROTONIX) EC tablet 40 mg  40 mg Oral Q1200 Cherene Altes, MD   40 mg at 02/12/17 1125     Discharge Medications: Please see discharge summary for a list of discharge medications.  Relevant Imaging Results:  Relevant Lab Results:   Additional Information SSN- [id number]  Wetzel Bjornstad, LCSWA

## 2017-02-12 NOTE — Progress Notes (Signed)
PROGRESS NOTE    Terry Wu  FOY:[id number] DOB: [birth year] DOA: 01/30/2017 PCP: Sinda Du, MD   Brief Narrative:  83yo WM PMHx Atrial fibrillation, CAD (CABG/AVR in 2006), HTN, Aortic Aneurysm DM 2 uncontrolled with complications,   Admitted to [facility] 8/8 with intractable diarrhea and dehydration. He was having more than 12 stools per day. He underwent a colonoscopy, which discovered a 4.4cm ulcerated friable mass and pathology deemed this to be adenocarcinoma. Of note he also has a large protruding umbilical hernia and large gallstones. He was transferred to [facility] 8/15 and underwent sigmoid colectomy with creation of descending colostomy on 8/16. Postoperatively he was recovered to the medical ICU and remained on ventilator. Upon extubation, he was discovered to have suffered a CVA.      Subjective: 8/21 A/O 4, negative CP, negative SOB, negative abdominal pain,   Assessment & Plan:   Active Problems:   HYPERTENSION, BENIGN   S/P AVR   CORONARY ARTERY BYPASS GRAFT, HX OF   Hyponatremia   COPD (chronic obstructive pulmonary disease) (HCC)   Bilateral leg edema   Diarrhea   Chronic diastolic heart failure (HCC)   Dehydration   Acute kidney injury ([place])   Preoperative cardiovascular examination   Acute respiratory failure (HCC)   Acute respiratory failure with hypoxia (HCC)   S/P abdominal surgery, follow-up exam   Cerebral embolism with cerebral infarction   Colostomy in place [facility])   Persistent atrial fibrillation ([place])   Large sigmoid colon cancer  -S/P status post Hartman's procedure and suture repair of umbilical hernia 7/67/20 -Ongoing care per General Surgery - appears to be doing well    Large chronically incarcerated umbilical hernia -Corrected at time of sigmoid colon cancer surgery   Acute Left ischemic MCA stroke 8/17 -Neurology suggests initiating aspirin therapy for now, w/ eventual resumption of warfarin - unfortunately the pt reports "swelling  of the mouth" when he takes ASA  - has persisting word finding difficulty  - Eliquis 5 mg  BID   Acute hypoxic respiratory failure  -in postoperative setting has resolved on room air   Dysphagia -Secondary CVA -Passed swallow evaluation, on dysphagia 3 diet fluid consistency thin    Acute renal failure Lab Results  Component Value Date   CREATININE 0.76 02/12/2017   CREATININE 0.78 02/11/2017   CREATININE 0.85 02/10/2017  -Resolved   Chronic Diastolic CHF  -Strict intake and Outtake -Daily weight -Coreg 3.125 mg BID -Pt on Demadex 63m daily at home(would be equal to 100 mg Lasix); will start low at Lasix 439mDaily   Pulmonary HTN -See CHF  CAD status post CABG 2006 -No c/o chest pain  Aortic valve replacement 2006  - bioprosthetic    Chronic Atrial fibrillation([place] 7) -Patient started on Eliquis 8/20    Cholelithiasis    Diabetes type 2 controlled without complication -  continue sensitive SSI  History of prostate cancer  -S/P radioactive seed placement    DVT prophylaxis: Eliquis Code Status: Full Family Communication: None Disposition Plan: TBD   [facility] General [facility]   Procedures/Significant Events:  [facility]8 [facility] with [facility] - remained intubated - required short term pressors  8/17 extubated - found to have word finding difficulty - MRI notes CVA      I have personally reviewed and interpreted all radiology studies and my findings are as above.  VENTILATOR SETTINGS: None   Cultures   Antimicrobials: Anti-infectives  Start     Stop   02/07/17 1418  cefoTEtan in Dextrose 5% (CEFOTAN) 2-2.08 GM-% IVPB  Status:  Discontinued    Comments:  Schonewitz, Leigh   : cabinet override   02/07/17 1519   02/07/17 1015  cefoTEtan (CEFOTAN) 1 g in dextrose 5 % 50 mL IVPB     02/07/17 1241       Devices    LINES / TUBES:      Continuous Infusions: . sodium  chloride 10 mL/hr at 02/12/17 0552     Objective: Vitals:   02/12/17 0400 02/12/17 0600 02/12/17 0745 02/12/17 0810  BP: 118/72 118/66  126/69  Pulse: 77 85  70  Resp: (!) 25 (!) 28    Temp:   98.6 F (37 C)   TempSrc:   Oral   SpO2: 95% 92%    Weight:      Height:        Intake/Output Summary (Last 24 hours) at 02/12/17 0846 Last data filed at 02/12/17 0000  Gross per 24 hour  Intake           244.17 ml  Output              450 ml  Net          -205.83 ml   Filed Weights   02/05/17 0600 02/06/17 0620 02/07/17 0614  Weight: 251 lb 4.8 oz (114 kg) 248 lb 14.4 oz (112.9 kg) 249 lb 3.2 oz (113 kg)    Examination:  General: A/O 4, No acute respiratory distress Eyes: negative scleral hemorrhage, negative anisocoria, negative icterus Lungs: positive bilaterally crackles, negative wheezing  Cardiovascular: Regular rate and rhythm without murmur gallop or rub normal S1 and S2 Abdomen: negative abdominal pain,positive distention, negative bowel sounds, no rebound, no ascites, no appreciable mass, Midline Exlap incision with staples, negative sign infection, Cholostomy bag in LLQ scant serous fluid Extremities: No significant cyanosis, clubbing, or edema bilateral lower extremities Psychiatric:  Negative depression, negative anxiety, negative fatigue, negative mania  Central nervous system:  Cranial nerves II through XII intact, tongue/uvula midline, all extremities muscle strength 5/5, sensation intact throughout,  negative dysarthria, negative expressive aphasia, negative receptive aphasia.  .     Data Reviewed: Care during the described time interval was provided by me .  I have reviewed this patient's available data, including medical history, events of note, physical examination, and all test results as part of my evaluation.   CBC:  Recent Labs Lab 02/06/17 0627  02/07/17 2012 02/08/17 0322 02/09/17 0430 02/11/17 0211 02/12/17 0307  WBC 13.8*  --  14.0* 13.2*  10.7* 7.9 7.0  NEUTROABS 11.0*  --  12.6*  --   --   --   --   HGB 10.2*  < > 9.7* 9.4* 8.5* 8.7* 8.5*  HCT 30.8*  < > 29.8* 28.5* 25.9* 27.2* 26.4*  MCV 98.1  --  96.8 95.3 93.8 95.4 95.7  PLT 191  --  170 140* 159 161 168  < > = values in this interval not displayed. Basic Metabolic Panel:  Recent Labs Lab 02/07/17 2012 02/08/17 0322 02/09/17 0430 02/10/17 0450 02/11/17 0211 02/12/17 0307  NA 134* 134* 138 139 139 138  K 4.7 4.6 4.3 4.7 4.1 4.3  CL 99* 99* 104 108 107 106  CO2 _0 20* 27 29  GLUCOSE 140* 142* 138* 99 141* 166*  BUN _1 CREATININE 1.19 1.26* 0.94 0.85  0.78 0.76  CALCIUM 8.4* 8.5* 8.5* 8.5* 8.3* 8.3*  MG 1.8  --   --   --   --   --   PHOS 3.3  --   --   --   --   --    GFR: Estimated Creatinine Clearance: 86.7 mL/min (by C-G formula based on SCr of 0.76 mg/dL). Liver Function Tests:  Recent Labs Lab 02/07/17 2012 02/08/17 0322 02/11/17 0211 02/12/17 0307  AST 20 18 13* 13*  ALT _0 ALKPHOS 29* 31* 24* 25*  BILITOT 1.0 1.2 1.0 0.8  PROT 5.8* 6.0* 5.4* 5.3*  ALBUMIN 2.8* 2.6* 2.4* 2.3*   No results for input(s): LIPASE, AMYLASE in the last 168 hours. No results for input(s): AMMONIA in the last 168 hours. Coagulation Profile:  Recent Labs Lab 02/06/17 0627 02/07/17 0506 02/07/17 2012 02/08/17 0332  INR 1.37 1.35 1.32 1.41   Cardiac Enzymes:  Recent Labs Lab 02/07/17 2012 02/08/17 0322  TROPONINI 0.07* 0.06*   BNP (last 3 results) No results for input(s): PROBNP in the last 8760 hours. HbA1C:  Recent Labs  02/10/17 0949  HGBA1C 6.4*   CBG:  Recent Labs Lab 02/11/17 0835 02/11/17 1141 02/11/17 1542 02/11/17 2154 02/12/17 0744  GLUCAP 106* 154* 163* 173* 150*   Lipid Profile:  Recent Labs  02/11/17 1453  CHOL 95  HDL 35*  LDLCALC 45  TRIG 76  CHOLHDL 2.7   Thyroid Function Tests: No results for input(s): TSH, T4TOTAL, FREET4, T3FREE, THYROIDAB in the last 72 hours. Anemia  Panel: No results for input(s): VITAMINB12, FOLATE, FERRITIN, TIBC, IRON, RETICCTPCT in the last 72 hours. Urine analysis:    Component Value Date/Time   COLORURINE YELLOW 02/04/2017 [place] 02/04/2017 0824   LABSPEC 1.010 02/04/2017 0824   PHURINE 7.0 02/04/2017 0824   GLUCOSEU NEGATIVE 02/04/2017 0824   HGBUR NEGATIVE 02/04/2017 0824   BILIRUBINUR NEGATIVE 02/04/2017 0824   KETONESUR NEGATIVE 02/04/2017 0824   PROTEINUR NEGATIVE 02/04/2017 0824   NITRITE NEGATIVE 02/04/2017 0824   LEUKOCYTESUR NEGATIVE 02/04/2017 0824   Sepsis Labs: _1 (procalcitonin:4,lacticidven:4)  ) Recent Results (from the past 240 hour(s))  Urine Culture     Status: None   Collection Time: 02/04/17  8:23 AM  Result Value Ref Range Status   Specimen Description URINE, CLEAN CATCH  Final   Special Requests NONE  Final   Culture   Final    NO GROWTH Performed at [facility], [place] [address], [place], [place] [address]    Report Status 02/05/2017 FINAL  Final  MRSA PCR Screening     Status: None   Collection Time: 02/07/17  5:53 PM  Result Value Ref Range Status   MRSA by PCR NEGATIVE NEGATIVE Final    Comment:        The GeneXpert MRSA Assay (FDA approved for NASAL specimens only), is one component of a comprehensive MRSA colonization surveillance program. It is not intended to diagnose MRSA infection nor to guide or monitor treatment for MRSA infections.          Radiology Studies: No results found.      Scheduled Meds: . apixaban  5 mg Oral BID  . atorvastatin  20 mg Oral q1800  . carvedilol  3.125 mg Oral BID WC  . chlorhexidine gluconate (MEDLINE KIT)  15 mL Mouth Rinse BID  . dorzolamide-timolol  1 drop Both Eyes BID  . insulin aspart  0-5 Units Subcutaneous QHS  .  insulin aspart  0-9 Units Subcutaneous TID WC  . mouth rinse  15 mL Mouth Rinse QID  . pantoprazole  40 mg Oral Q1200   Continuous Infusions: . sodium chloride 10 mL/hr at  02/12/17 0552     LOS: 13 days    Time spent: 40 minutes    WOODS, Geraldo Docker, MD [facility] Pager [phone]   If 7PM-7AM, please contact night-coverage [email or web address] Password [facility] 02/12/2017, 8:46 AM

## 2017-02-12 NOTE — PMR Pre-admission (Signed)
PMR Admission Coordinator Pre-Admission Assessment  Patient: Terry Wu is an 83 y.o., male MRN: [id number] DOB: 09-26-1931 Height: _0  (175.3 cm) Weight: 114.2 kg (251 lb 11.2 oz)              Insurance Information HMO:     PPO:      PCP:      IPA:      80/20: yes     OTHER: no HMO PRIMARY: Medicare a and b      Policy#: [id number]      Subscriber: pt Benefits:  Phone #: passport one online     Name: 02/11/17 Eff. Date: 12/24/1998     Deduct: $1340      Out of Pocket Max: none      Life Max: none CIR: 100%      SNF: 20 full days Outpatient: 80%     Co-Pay: 20% Home Health: 100%      Co-Pay: none DME: 80%     Co-Pay: 20% Providers: pt choice  SECONDARY: AARP supplement      Policy#: [id number]      Subscriber: pt  Medicaid Application Date:       Case Manager:  Disability Application Date:       Case Worker:   Emergency Contact Information Contact Information    Name Relation Home Work Mobile   [place] Spouse [phone]  [phone]   Garrick, Midgley   [phone]     Current Medical History  Patient Admitting Diagnosis: 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  Presented 01/30/2017 with diarrhea 2 weeks and increasing shortness of breath with ambulation. He denied any abdominal pain or rectal bleeding. CT of abdomen and pelvis showed no acute intra-abdominal or intrapelvic process identified. Clostridium difficile specimen negative. Underwent colonoscopy 02/05/2017 per gastroenterology that showed proximal sigmoid colon, mid sigmoid colon and descending colon to be normal. There was a malignant tumor in the distal sigmoid colon that was  biopsied. Later underwent sigmoid colectomy with creation of descending colostomy after new diagnosis of adenocarcinoma per biopsy 02/07/2017 per general surgery. Patient was extubated 02/09/2007. MRI of the brain due to postoperative confusion and word finding difficulty showed patchy acute small left MCA territory infarcts. 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 MRA with no emergent large vessel occlusion. Carotid Dopplers had no ICA stenosis. His chronic Coumadin currently is on hold and was transitioned to Eliquisper follow-up of cardiology services. Acute blood loss anemia monitored. Tolerating a mechanical soft diet.   Total: 0 NIHSS    Past Medical History  Past Medical History:  Diagnosis Date  . Aortic aneurysm (HCC)    Ascending thoracic - 4.8 cm by CT 2017  . Aortic regurgitation    Bioprosthetic AVR 2006  . Atrial fibrillation with controlled ventricular response ([place]) 08/22/2015   CHADS2VASC=5, on Coumadin  . CAD (coronary artery disease)    Multivessel status post CABG 2006  . Complication of anesthesia   . Esophageal reflux   . Essential hypertension   . History of dizziness   . Mixed hyperlipidemia   . Type 2 diabetes mellitus (HCC)     Family History  family history includes Cancer in his unknown relative; Heart Problems in his mother; Stroke  in his father.  Prior Rehab/Hospitalizations:  Has the patient had major surgery during 100 days prior to admission? No  Current Medications   Current Facility-Administered Medications:  .  apixaban (ELIQUIS) tablet 5 mg, 5 mg, Oral, BID, Cherene Altes, MD, 5 mg at 02/14/17 0948 .  atorvastatin (LIPITOR) tablet 20 mg, 20 mg, Oral, q1800, Rosalin Hawking, MD, 20 mg at 02/13/17 1804 .  carvedilol (COREG) tablet 3.125 mg, 3.125 mg, Oral, BID WC, Cherene Altes, MD, 3.125 mg at 02/14/17 0948 .  chlorhexidine gluconate (MEDLINE KIT) (PERIDEX) 0.12 % solution 15 mL, 15 mL, Mouth Rinse, BID, Javier Glazier, MD, 15 mL at  02/14/17 0949 .  dorzolamide-timolol (COSOPT) 22.3-6.8 MG/ML ophthalmic solution 1 drop, 1 drop, Both Eyes, BID, Sinda Du, MD, 1 drop at 02/14/17 0944 .  fentaNYL (SUBLIMAZE) injection 25-50 mcg, 25-50 mcg, Intravenous, Q2H PRN, Cherene Altes, MD .  furosemide (LASIX) injection 40 mg, 40 mg, Intravenous, Daily, Allie Bossier, MD, 40 mg at 02/14/17 0948 .  insulin aspart (novoLOG) injection 0-5 Units, 0-5 Units, Subcutaneous, QHS, McClung, Jeffrey T, MD .  insulin aspart (novoLOG) injection 0-9 Units, 0-9 Units, Subcutaneous, TID WC, Cherene Altes, MD, 1 Units at 02/14/17 [id number] .  levalbuterol (XOPENEX) nebulizer solution 0.63 mg, 0.63 mg, Nebulization, Q3H PRN, Cherene Altes, MD .  MEDLINE mouth rinse, 15 mL, Mouth Rinse, QID, Javier Glazier, MD, 15 mL at 02/12/17 2251 .  pantoprazole (PROTONIX) EC tablet 40 mg, 40 mg, Oral, Q1200, Cherene Altes, MD, 40 mg at 02/13/17 1236 .  polyethylene glycol (MIRALAX / GLYCOLAX) packet 17 g, 17 g, Oral, Daily, Simaan, Elizabeth S, PA-C, 17 g at 02/14/17 2979  Patients Current Diet: DIET DYS 3 Room service appropriate? Yes; Fluid consistency: Thin  Precautions / Restrictions Precautions Precautions: Fall Restrictions Weight Bearing Restrictions: No   Has the patient had 2 or more falls or a fall with injury in the past year?No  Prior Activity Level Community (5-7x/wk): not driving for 6 months due to glaucoma, poor peripheral vision  Development worker, international aid / Equipment Home Assistive Devices/Equipment: None Home Equipment: Walker - standard  Prior Device Use: Indicate devices/aids used by the patient prior to current illness, exacerbation or injury? None of the above  Prior Functional Level Prior Function Level of Independence: Independent Comments: no driving for 6 months due to vision  Self Care: Did the patient need help bathing, dressing, using the toilet or eating?  Independent  Indoor Mobility: Did the  patient need assistance with walking from room to room (with or without device)? Independent  Stairs: Did the patient need assistance with internal or external stairs (with or without device)? Independent  Functional Cognition: Did the patient need help planning regular tasks such as shopping or remembering to take medications? Independent  Current Functional Level Cognition  Arousal/Alertness: Awake/alert Overall Cognitive Status: Impaired/Different from baseline Current Attention Level: Selective Orientation Level: Oriented X4 Following Commands: Follows multi-step commands with increased time General Comments: multimodal cues for safety during transfers  Attention: Sustained Sustained Attention: Appears intact    Extremity Assessment (includes Sensation/Coordination)  Upper Extremity Assessment: Defer to OT evaluation LUE Deficits / Details: Grossly 4/5 strength   Lower Extremity Assessment: Generalized weakness    ADLs  Overall ADL's : Needs assistance/impaired Eating/Feeding: Set up, Sitting Grooming: Wash/dry face, Wash/dry hands, Set up, Sitting Upper Body Bathing: Minimal assistance, Sitting Lower Body Bathing: Maximal assistance, Sit to/from stand Upper Body Dressing : Moderate assistance,  Sitting Lower Body Dressing: Total assistance, Sit to/from stand Toilet Transfer: Minimal assistance, +2 for physical assistance, +2 for safety/equipment, Stand-pivot, BSC, RW Toileting- Clothing Manipulation and Hygiene: Total assistance Toileting - Clothing Manipulation Details (indicate cue type and reason): foley and ostomy Functional mobility during ADLs: Minimal assistance, +2 for safety/equipment, Rolling walker, +2 for physical assistance General ADL Comments: Pt completed stand pivot to recliner, initially ModA +2 for sit>stand at RW, MinA to pivot to recliner, ModA+1 for additional sit>stand at RW, completed seated grooming ADLs in recliner    Mobility  Overal bed mobility:  Needs Assistance Bed Mobility: Supine to Sit, Sit to Supine Supine to sit: Mod assist, +2 for physical assistance, HOB elevated Sit to supine: Mod assist, +2 for physical assistance General bed mobility comments: increased time and effort, multimodal cueing for sequencing, use of bed rails, assist to elevate trunk to achieve sitting EOB, and assist to lift LEs back into bed    Transfers  Overall transfer level: Needs assistance Equipment used: Rolling walker (2 wheeled) Transfers: Sit to/from Stand Sit to Stand: Min assist, +2 safety/equipment Stand pivot transfers: Min assist General transfer comment: increased time, cueing for technique and hand placement, assist for stability with transition into standing from sitting EOB    Ambulation / Gait / Stairs / Wheelchair Mobility  Ambulation/Gait Ambulation/Gait assistance: Physicist, medical (Feet): 20 Feet Assistive device: Rolling walker (2 wheeled) Gait Pattern/deviations: Step-through pattern, Decreased step length - right, Decreased step length - left, Trunk flexed General Gait Details: modest instability and fatiguing quickly, min guard for safety with use of RW Gait velocity: decreased Gait velocity interpretation: <1.8 ft/sec, indicative of risk for recurrent falls    Posture / Balance Balance Overall balance assessment: Needs assistance, History of Falls Sitting-balance support: Feet supported Sitting balance-Leahy Scale: Fair Standing balance support: Bilateral upper extremity supported Standing balance-Leahy Scale: Poor Standing balance comment: Pt reliant on bil UE support and min guard     Special needs/care consideration BiPAP/CPAP  N/a CPM  N/a Continuous Drip IV n/a Dialysis  N/a Life Vest  N/a Oxygen  N/a Special Bed  N/a Trach Size  N/a Wound Vac n/a Skin surgical incision with staples and ecchymosis; abrasion to buttocks right and left; ecchymosis to back right and left; moisture associated area to  skin of buttocks right and left; ostomy. Bowel mgmt: [place] nurse ostomy follow up with Julien Girt [phone] Bladder mgmt: incontinent ; condom catheter Diabetic mgmt yes    Previous Home Environment Living Arrangements: Spouse/significant other, Children (adult daughter lives with parents)  Lives With: Spouse, Daughter Available Help at Discharge: Family, Available 24 hours/day Type of Home: House Home Layout: Multi-level Alternate Level Stairs-Rails: Right Alternate Level Stairs-Number of Steps: 7 Home Access: Level entry Bathroom Shower/Tub: Multimedia programmer: Standard Bathroom Accessibility: Yes How Accessible: Accessible via walker Nuevo: No  Discharge Living Setting Plans for Discharge Living Setting: Patient's home, Lives with (comment) (spouse and adult daughter) Type of Home at Discharge: House Discharge Home Layout: Multi-level Alternate Level Stairs-Rails: Right Alternate Level Stairs-Number of Steps: 7 Discharge Home Access: Level entry Discharge Bathroom Shower/Tub: Walk-in shower Discharge Bathroom Toilet: Standard Discharge Bathroom Accessibility: Yes How Accessible: Accessible via walker Does the patient have any problems obtaining your medications?: No  Social/Family/Support Systems Patient Roles: Spouse, Parent Contact Information: Rosendo Gros, wife Anticipated Caregiver: wife and daughter Anticipated Caregiver's Contact Information: see above Ability/Limitations of Caregiver: wife 50 years old; daughter has vision issues and unemployed due  to vision Caregiver Availability: 24/7 Discharge Plan Discussed with Primary Caregiver: Yes Is Caregiver In Agreement with Plan?: Yes Does Caregiver/Family have Issues with Lodging/Transportation while Pt is in Rehab?: Yes  Goals/Additional Needs Patient/Family Goal for Rehab: Mod I to superivison with PT, OT, and SLP Expected length of stay: ELOS 10- 15 days Special Service Needs: new  diagnosis of adenocarcinoma Pt/Family Agrees to Admission and willing to participate: Yes Program Orientation Provided & Reviewed with Pt/Caregiver Including Roles  & Responsibilities: Yes  Barriers to Discharge: Decreased caregiver support (new colostomy)  Decrease burden of Care through IP rehab admission: n/a  Possible need for SNF placement upon discharge:not anticipated, but if pt does not reach supervision level, they may would consider.  Patient Condition: This patient's medical and functional status has changed since the consult dated: 02/11/2017 in which the Rehabilitation Physician determined and documented that the patient's condition is appropriate for intensive rehabilitative care in an inpatient rehabilitation facility. See "History of Present Illness" (above) for medical update. Functional changes are: overall mod assist. Patient's medical and functional status update has been discussed with the Rehabilitation physician and patient remains appropriate for inpatient rehabilitation. Will admit to inpatient rehab today.  Preadmission Screen Completed By:  Cleatrice Burke, 02/14/2017 12:13 PM ______________________________________________________________________   Discussed status with Dr. Letta Pate on 02/14/2017 at 1213 and received telephone approval for admission today.  Admission Coordinator:  Cleatrice Burke, time 7741 Date 02/14/2017

## 2017-02-12 NOTE — Progress Notes (Signed)
Transferring pt to 5W30 report given to [facility]. Pt is stable and vs are within normal limits. Pt being transported in bed with two RN's.   Scheryl Darter, RN

## 2017-02-13 DIAGNOSIS — R600 Localized edema: Secondary | ICD-10-CM

## 2017-02-13 DIAGNOSIS — I5033 Acute on chronic diastolic (congestive) heart failure: Secondary | ICD-10-CM

## 2017-02-13 LAB — CBC
HCT: 28.6 % — ABNORMAL LOW (ref 39.0–52.0)
Hemoglobin: 9.1 g/dL — ABNORMAL LOW (ref 13.0–17.0)
MCH: 31.2 pg (ref 26.0–34.0)
MCHC: 31.8 g/dL (ref 30.0–36.0)
MCV: 97.9 fL (ref 78.0–100.0)
PLATELETS: 181 10*3/uL (ref 150–400)
RBC: 2.92 MIL/uL — ABNORMAL LOW (ref 4.22–5.81)
RDW: 13.9 % (ref 11.5–15.5)
WBC: 8.4 10*3/uL (ref 4.0–10.5)

## 2017-02-13 LAB — GLUCOSE, CAPILLARY
GLUCOSE-CAPILLARY: 156 mg/dL — AB (ref 65–99)
GLUCOSE-CAPILLARY: 163 mg/dL — AB (ref 65–99)
Glucose-Capillary: 156 mg/dL — ABNORMAL HIGH (ref 65–99)
Glucose-Capillary: 179 mg/dL — ABNORMAL HIGH (ref 65–99)

## 2017-02-13 MED ORDER — POLYETHYLENE GLYCOL 3350 17 G PO PACK
17.0000 g | PACK | Freq: Every day | ORAL | Status: DC
  Administered 2017-02-13 – 2017-02-14 (×2): 17 g via ORAL
  Filled 2017-02-13 (×2): qty 1

## 2017-02-13 NOTE — Care Management Important Message (Signed)
Important Message  Patient Details  Name: Terry Wu MRN: [id number] Date of Birth: 1931/10/08   Medicare Important Message Given:  Yes    Nathen May 02/13/2017, 9:59 AM

## 2017-02-13 NOTE — Progress Notes (Signed)
[facility] Progress Note  6 Days Post-Op  Subjective: CC:  Sitting up finishing breakfast. Denies abdominal pain, nausea, or vomiting. Having flatus in ostomy, unable to report if having stool. Reports sitting up in chair yesterday. States he has only worked with PT one time since admission.  Objective: Vital signs in last 24 hours: Temp:  [97.9 F (36.6 C)-98.5 F (36.9 C)] 98.1 F (36.7 C) (08/22 0647) Pulse Rate:  [53-94] 75 (08/22 0647) Resp:  [18-32] 18 (08/22 0647) BP: (106-137)/(60-78) 118/72 (08/22 0647) SpO2:  [96 %-100 %] 99 % (08/22 0647) Weight:  [114.2 kg (251 lb 11.2 oz)] 114.2 kg (251 lb 11.2 oz) (08/22 0555) Last BM Date: 02/12/17  Intake/Output from previous day: 08/21 0701 - 08/22 0700 In: 650 [P.O.:460; I.V.:190] Out: 600 [Urine:600] Intake/Output this shift: Total I/O In: 120 [P.O.:120] Out: -   PE: Gen:  Alert, NAD, pleasant and cooperative Pulm:  Normal effort Abd: Soft, protuberant, non-tender,  bowel sounds present all 4 quadrants, midline staples c/d/i with small amount of serous drainage from inferior aspect of wound - no purulence. Small amount erythema around incision. stoma pink and viable, colostomy pouch with large amount of gas and small amount SS drainage.  Skin: warm and dry, no rashes  Psych: A&Ox3    Lab Results:   Recent Labs  02/12/17 0307 02/13/17 0430  WBC 7.0 8.4  HGB 8.5* 9.1*  HCT 26.4* 28.6*  PLT 168 181   BMET  Recent Labs  02/11/17 0211 02/12/17 0307  NA 139 138  K 4.1 4.3  CL 107 106  CO2 27 29  GLUCOSE 141* 166*  BUN 12 11  CREATININE 0.78 0.76  CALCIUM 8.3* 8.3*   PT/INR No results for input(s): LABPROT, INR in the last 72 hours. CMP     Component Value Date/Time   NA 138 02/12/2017 0307   K 4.3 02/12/2017 0307   CL 106 02/12/2017 0307   CO2 29 02/12/2017 0307   GLUCOSE 166 (H) 02/12/2017 0307   BUN 11 02/12/2017 0307   CREATININE 0.76 02/12/2017 0307   CREATININE 0.97  09/22/2015 1139   CALCIUM 8.3 (L) 02/12/2017 0307   PROT 5.3 (L) 02/12/2017 0307   ALBUMIN 2.3 (L) 02/12/2017 0307   AST 13 (L) 02/12/2017 0307   ALT 17 02/12/2017 0307   ALKPHOS 25 (L) 02/12/2017 0307   BILITOT 0.8 02/12/2017 0307   GFRNONAA >60 02/12/2017 0307   GFRAA >60 02/12/2017 0307   Lipase  No results found for: LIPASE     Studies/Results: No results found.  Anti-infectives: Anti-infectives    Start     Dose/Rate Route Frequency Ordered Stop   02/07/17 1418  cefoTEtan in Dextrose 5% (CEFOTAN) 2-2.08 GM-% IVPB  Status:  Discontinued    Comments:  Schonewitz, Leigh   : cabinet override      02/07/17 1418 02/07/17 1519   02/07/17 1015  cefoTEtan (CEFOTAN) 1 g in dextrose 5 % 50 mL IVPB     1 g 100 mL/hr over 30 Minutes Intravenous  Once 02/07/17 1012 02/07/17 1241     Assessment/Plan POD#5 S/p Hartmann's procedure and suture repair of umbilical hernia 5/36 Dr. Donnie Mesa - path showing T2N0 adenocarcinoma, recommend outpatient oncology follow up. - tolerating dys 3 diet, advance as tolerated per SLP recommendations - having large amounts of flatus in ostomy pouch, but not stool.  - Mobilize with PT - incentive spirometry  - approaching readiness for discharge to recommended rehab facility, would  like to see patient have stool in his pouch first.   FEN: dys 3 diet VTE: SCD's, Eliquis Follow up: staple removal 8/30 @ [place] office, post-op F/U Dr. Georgette Dover 3-4 weeks.   LOS: 14 days    Jill Alexanders , [facility] 02/13/2017, 9:17 AM Pager: [phone] Consults: [phone] Mon-Fri 7:00 am-4:30 pm Sat-Sun 7:00 am-11:30 am

## 2017-02-13 NOTE — Progress Notes (Addendum)
Occupational Therapy Treatment Patient Details Name: WYLAN Wu MRN: [id number] DOB: 12-03-1931 Today's Date: 02/13/2017    History of present illness This 83 y.o. male admitted to [facility] 01/30/17 with intractable diarrhea and dehydration.  Colonoscopy revealed adenocarcinoma.  He was transferred to [facility] 8/15 and underwent signoid colectomy with creation of descenting colostomy.   Post operatively he was found to have had CVA - MRI showed Lt MCA territory infarcts, and multiple old cerebellar infarcts.  PMH includes:  A-Fib. CAD, s/p CABG/AVR. DM, Aortic aneurysm   OT comments  Pt progressing towards goals, requires multimodal cues during bed mobility and stand pivot transfer for sequencing and safety. Pt completing with overall +2 MinA with increased assist for safety. Pt completed grooming ADLs seated in recliner with setup/supervision and with increased attention to L visual field throughout. Pt will continue to benefit from OT services in acute and CIR settings to progress Pt's safety and independence with ADLs and functional mobility prior to return home. Will follow.     Follow Up Recommendations  CIR;Supervision/Assistance - 24 hour    Equipment Recommendations  3 in 1 bedside commode    Recommendations for Other Services Rehab consult    Precautions / Restrictions Precautions Precautions: Fall Restrictions Weight Bearing Restrictions: No       Mobility Bed Mobility Overal bed mobility: Needs Assistance Bed Mobility: Supine to Sit     Supine to sit: HOB elevated;Max assist;+2 for safety/equipment     General bed mobility comments: MaxA for LE management and to bring trunk into upright position, assist to scoot hips to EOB; verbal cues for UE placement to self-assist   Transfers Overall transfer level: Needs assistance Equipment used: Rolling walker (2 wheeled) Transfers: Sit to/from [facility] Sit to Stand: Min assist;+2 physical assistance;+2  safety/equipment;Mod assist Stand pivot transfers: Min assist       General transfer comment: requires verbal cues for hand placement; initially required +2 assist to stand from EOB, ModA +1 to stand from recliner     Balance Overall balance assessment: Needs assistance;History of Falls Sitting-balance support: Single extremity supported;Feet supported Sitting balance-Leahy Scale: Fair     Standing balance support: Bilateral upper extremity supported Standing balance-Leahy Scale: Poor Standing balance comment: Pt reliant on bil UE support and min A                            ADL either performed or assessed with clinical judgement   ADL Overall ADL's : Needs assistance/impaired Eating/Feeding: Set up;Sitting   Grooming: Wash/dry face;Wash/dry hands;Set up;Sitting               Lower Body Dressing: Total assistance;Sit to/from stand       Toileting- Water quality scientist and Hygiene: Total assistance Toileting - Clothing Manipulation Details (indicate cue type and reason): foley and ostomy     Functional mobility during ADLs: Minimal assistance;+2 for safety/equipment;Rolling walker;+2 for physical assistance General ADL Comments: Pt completed stand pivot to recliner, initially ModA +2 for sit>stand at RW, MinA to pivot to recliner, ModA+1 for additional sit>stand at RW, completed seated grooming ADLs in recliner                       Cognition Arousal/Alertness: Awake/alert Behavior During Therapy: Flat affect Overall Cognitive Status: Impaired/Different from baseline Area of Impairment: Attention;Following commands;Awareness;Problem solving  Current Attention Level: Selective   Following Commands: Follows multi-step commands with increased time   Awareness: Emergent Problem Solving: Slow processing;Difficulty sequencing;Decreased initiation;Requires verbal cues;Requires tactile cues General Comments: multimodal cues  for safety during transfers                           Pertinent Vitals/ Pain       Pain Assessment: Faces Faces Pain Scale: No hurt                                                          Frequency  Min 2X/week        Progress Toward Goals  OT Goals(current goals can now be found in the care plan section)  Progress towards OT goals: Progressing toward goals  Acute Rehab OT Goals Patient Stated Goal: to go home  OT Goal Formulation: With patient Time For Goal Achievement: 02/25/17 Potential to Achieve Goals: Good  Plan Discharge plan remains appropriate                     AM-PAC PT "6 Clicks" Daily Activity     Outcome Measure   Help from another person eating meals?: Total Help from another person taking care of personal grooming?: A Little Help from another person toileting, which includes using toliet, bedpan, or urinal?: A Lot Help from another person bathing (including washing, rinsing, drying)?: A Lot Help from another person to put on and taking off regular upper body clothing?: A Lot Help from another person to put on and taking off regular lower body clothing?: Total 6 Click Score: 11    End of Session Equipment Utilized During Treatment: Rolling walker;Gait belt  OT Visit Diagnosis: Unsteadiness on feet (R26.81);Cognitive communication deficit (R41.841) Symptoms and signs involving cognitive functions: Cerebral infarction   Activity Tolerance Patient tolerated treatment well   Patient Left in chair;with call bell/phone within reach;with chair alarm set   Nurse Communication Mobility status        Time: 7034-0352 OT Time Calculation (min): 31 min  Charges: OT General Charges $OT Visit: 1 Procedure OT Treatments $Self Care/Home Management : 23-37 mins  Lou Cal, OT Pager [phone] 02/13/2017    Raymondo Band 02/13/2017, 12:42 PM

## 2017-02-13 NOTE — Progress Notes (Signed)
PROGRESS NOTE  Terry Wu SHF:[id number] DOB: [birth year] DOA: 01/30/2017 PCP: Sinda Du, MD  Brief Summary:   [facility], had surgery, post op respiratory failure, remain intubated and transferred to icu, post op course also complicated by acute cva, neurology consulted , he has improved, extubated on 8/17, transferred to hospitalist service on 8/20, cir placement once cleared by general surgery ( general surgery would like to see stool output from colostomy bag before transfer per note on 8/22  Wean oxygen, continue iv lasix, bilateral lower extremity pitting edema  Significant Events: 8/8 admit 8/15 transfer to [facility] 8/16 colectomy with colostomy - remained intubated - required short term pressors  8/17 extubated - found to have word finding difficulty - MRI notes CVA  HPI/Recap of past 24 hours:  Feeling better, on oxygen supplement, some pitting edema lower extremities, colostomy bad with air and scant fluids, no fecal matter observed No fever  Assessment/Plan: Active Problems:   HYPERTENSION, BENIGN   S/P AVR   CORONARY ARTERY BYPASS GRAFT, HX OF   Hyponatremia   COPD (chronic obstructive pulmonary disease) (HCC)   Bilateral leg edema   Diarrhea   Chronic diastolic heart failure ([place])   Dehydration   Acute kidney injury ([place])   Preoperative cardiovascular examination   Acute respiratory failure ([place])   Acute respiratory failure with hypoxia ([place])   S/P abdominal surgery, follow-up exam   Cerebral embolism with cerebral infarction   Colostomy in place [facility])   Persistent atrial fibrillation ([place])   Large sigmoid colon cancer status post Hartman's procedure and suture repair of umbilical hernia 10/25/75 Ongoing care per General Surgery - appears to be doing well   Large chronically incarcerated umbilical hernia Corrected at time of sigmoid colon cancer surgery  Left acute MCA infarcts 8/17 Neurology suggests initiating aspirin therapy for  now, w/ eventual resumption of warfarin - unfortunately the pt reports "swelling of the mouth" when he takes ASA - has persisting word finding difficulty - IV heparin initiated 8/19 w/o bolus - transition to warfarin prior to d/c if no bleeding complications on heparin   Acute hypoxic respiratory failure in postoperative setting Has now been extubated and is stable from respiratory standpoint - wean O2 as able   Acute on chronic diastolic chf: D/v ivf, continue iv lasix, wean oxygen, monitor lower extremity edema  Dysphagia Due to CVA - SLP has cleared for diet - he is tolerating dysphagia 3 diet thus far   Acute kidney injury Resolved with creatinine now normal  Chronic Atrial fibrillation In afib/ rate controlled Chadsvasc is now 7 - was on warfarin at time of admission - unable to utilize aspirin at present due to allergy -  He received  heparin gtt during hospitalization, now on apixaban  CAD status post CABG 2006 No c/o chest pain  Cholelithiasis Incidental findings on Ct scan "Cholelithiasis without definite CT evidence of cholecystitis" lft unremarkable  He denies right upper quadrant pain, no n/v  Aortic valve replacement 2006 - bioprosthetic   DM2, noninsulin dependent prior to hospitalization CBG currently well-controlled on ssi  History of prostate cancer status post radioactive seed placement  DVT prophylaxis: SCDs Code Status: FULL CODE Family Communication: no family present at time of exam  Disposition Plan: cir placement , likely on 8/23, wound care per Surgery   Consultants:  PCCM Gen Surgery  Antimicrobials:  none    Objective: BP 118/72 (BP Location: Left Arm)   Pulse 75   Temp 98.1  F (36.7 C)   Resp 18   Ht '5\' 9"'$  (1.753 m)   Wt 114.2 kg (251 lb 11.2 oz)   SpO2 99%   BMI 37.17 kg/m   Intake/Output Summary (Last 24 hours) at 02/13/17 1159 Last data filed at 02/13/17 1143  Gross per 24 hour  Intake              400 ml    Output             1800 ml  Net            -1400 ml   Filed Weights   02/06/17 0620 02/07/17 0614 02/13/17 0555  Weight: 112.9 kg (248 lb 14.4 oz) 113 kg (249 lb 3.2 oz) 114.2 kg (251 lb 11.2 oz)    Exam: Patient is examined daily including today on 02/13/2017, exams remain the same as of yesterday except that has changed    General:  Frail, NAD  Cardiovascular: IRRR  Respiratory: diminished at basis, no wheezing, no rhonchi  Abdomen: post op changes, colostomy bad in place with air and scan fluids, positive BS  Musculoskeletal: bilateral lower extremity 1-2+ pitting Edema  Neuro: alert, oriented   Data Reviewed: Basic Metabolic Panel:  Recent Labs Lab 02/07/17 2012 02/08/17 0322 02/09/17 0430 02/10/17 0450 02/11/17 0211 02/12/17 0307  NA 134* 134* 138 139 139 138  K 4.7 4.6 4.3 4.7 4.1 4.3  CL 99* 99* 104 108 107 106  CO2 '27 26 27 '$ 20* 27 29  GLUCOSE 140* 142* 138* 99 141* 166*  BUN '15 18 19 13 12 11  '$ CREATININE 1.19 1.26* 0.94 0.85 0.78 0.76  CALCIUM 8.4* 8.5* 8.5* 8.5* 8.3* 8.3*  MG 1.8  --   --   --   --   --   PHOS 3.3  --   --   --   --   --    Liver Function Tests:  Recent Labs Lab 02/07/17 2012 02/08/17 0322 02/11/17 0211 02/12/17 0307  AST 20 18 13* 13*  ALT '24 23 17 17  '$ ALKPHOS 29* 31* 24* 25*  BILITOT 1.0 1.2 1.0 0.8  PROT 5.8* 6.0* 5.4* 5.3*  ALBUMIN 2.8* 2.6* 2.4* 2.3*   No results for input(s): LIPASE, AMYLASE in the last 168 hours. No results for input(s): AMMONIA in the last 168 hours. CBC:  Recent Labs Lab 02/07/17 2012 02/08/17 0322 02/09/17 0430 02/11/17 0211 02/12/17 0307 02/13/17 0430  WBC 14.0* 13.2* 10.7* 7.9 7.0 8.4  NEUTROABS 12.6*  --   --   --   --   --   HGB 9.7* 9.4* 8.5* 8.7* 8.5* 9.1*  HCT 29.8* 28.5* 25.9* 27.2* 26.4* 28.6*  MCV 96.8 95.3 93.8 95.4 95.7 97.9  PLT 170 140* 159 161 168 181   Cardiac Enzymes:    Recent Labs Lab 02/07/17 2012 02/08/17 0322  TROPONINI 0.07* 0.06*   BNP (last 3  results) No results for input(s): BNP in the last 8760 hours.  ProBNP (last 3 results) No results for input(s): PROBNP in the last 8760 hours.  CBG:  Recent Labs Lab 02/12/17 0744 02/12/17 1145 02/12/17 1512 02/12/17 2252 02/13/17 0756  GLUCAP 150* 131* 171* 185* 156*    Recent Results (from the past 240 hour(s))  Urine Culture     Status: None   Collection Time: 02/04/17  8:23 AM  Result Value Ref Range Status   Specimen Description URINE, CLEAN CATCH  Final   Special Requests NONE  Final   Culture   Final    NO GROWTH Performed at [facility], [place] [address] [place], [place] [address]    Report Status 02/05/2017 FINAL  Final  MRSA PCR Screening     Status: None   Collection Time: 02/07/17  5:53 PM  Result Value Ref Range Status   MRSA by PCR NEGATIVE NEGATIVE Final    Comment:        The GeneXpert MRSA Assay (FDA approved for NASAL specimens only), is one component of a comprehensive MRSA colonization surveillance program. It is not intended to diagnose MRSA infection nor to guide or monitor treatment for MRSA infections.      Studies: No results found.  Scheduled Meds: . apixaban  5 mg Oral BID  . atorvastatin  20 mg Oral q1800  . carvedilol  3.125 mg Oral BID WC  . chlorhexidine gluconate (MEDLINE KIT)  15 mL Mouth Rinse BID  . dorzolamide-timolol  1 drop Both Eyes BID  . furosemide  40 mg Intravenous Daily  . insulin aspart  0-5 Units Subcutaneous QHS  . insulin aspart  0-9 Units Subcutaneous TID WC  . mouth rinse  15 mL Mouth Rinse QID  . pantoprazole  40 mg Oral Q1200  . polyethylene glycol  17 g Oral Daily    Continuous Infusions:   Time spent: 71mns, case discussed with inpatient rehab and general surgery  I have personally reviewed and interpreted on  02/13/2017 daily labs, tele strips, imagings as discussed above under date review session and assessment and plans.  I have discussed plan of care as described above with RN ,  patient  on 02/13/2017   Hovanes Hymas MD, PhD  [facility] Pager [phone] If 7PM-7AM, please contact night-coverage at [email or web address], password [email or web address]8/22/2018, 11:59 AM  LOS: 14 days

## 2017-02-13 NOTE — Progress Notes (Signed)
Per CCMD pt had 2.24 seconds pause on the heart monitor [facility] has been paged no new order yet will continue to monitor

## 2017-02-13 NOTE — Progress Notes (Signed)
Physical Therapy Treatment Patient Details Name: Terry Wu MRN: [id number] DOB: 1932-01-20 Today's Date: 02/13/2017    History of Present Illness This 83 y.o. male admitted to [facility] 01/30/17 with intractable diarrhea and dehydration.  Colonoscopy revealed adenocarcinoma.  He was transferred to [facility] 8/15 and underwent signoid colectomy with creation of descenting colostomy.   Post operatively he was found to have had CVA - MRI showed Lt MCA territory infarcts, and multiple old cerebellar infarcts.  PMH includes:  A-Fib. CAD, s/p CABG/AVR. DM, Aortic aneurysm    PT Comments    Pt making steady progress towards achieving his current functional mobility goals. Pt continues to be limited secondary to fatigue and weakness.  Pt ambulated within room (10') with min guard for safety and use of RW. Pt's spouse present throughout and very eager to assist. PT continuing to recommend Inpatient Rehab prior to d/c home at this time. Pt would continue to benefit from skilled physical therapy services at this time while admitted and after d/c to address the below listed limitations in order to improve overall safety and independence with functional mobility.   Follow Up Recommendations  CIR     Equipment Recommendations  Other (comment) (defer to next venue)    Recommendations for Other Services       Precautions / Restrictions Precautions Precautions: Fall Restrictions Weight Bearing Restrictions: No    Mobility  Bed Mobility Overal bed mobility: Needs Assistance Bed Mobility: Supine to Sit;Sit to Supine     Supine to sit: Mod assist;+2 for physical assistance;HOB elevated Sit to supine: Mod assist;+2 for physical assistance   General bed mobility comments: increased time and effort, multimodal cueing for sequencing, use of bed rails, assist to elevate trunk to achieve sitting EOB, and assist to lift LEs back into bed  Transfers Overall transfer level: Needs assistance Equipment used: Rolling  walker (2 wheeled) Transfers: Sit to/from Stand Sit to Stand: Min assist;+2 safety/equipment Stand pivot transfers: Min assist       General transfer comment: increased time, cueing for technique and hand placement, assist for stability with transition into standing from sitting EOB  Ambulation/Gait Ambulation/Gait assistance: Min guard Ambulation Distance (Feet): 20 Feet Assistive device: Rolling walker (2 wheeled) Gait Pattern/deviations: Step-through pattern;Decreased step length - right;Decreased step length - left;Trunk flexed Gait velocity: decreased Gait velocity interpretation: <1.8 ft/sec, indicative of risk for recurrent falls General Gait Details: modest instability and fatiguing quickly, min guard for safety with use of RW   Stairs            Wheelchair Mobility    Modified Rankin (Stroke Patients Only) Modified Rankin (Stroke Patients Only) Pre-Morbid Rankin Score: Moderate disability Modified Rankin: Moderately severe disability     Balance Overall balance assessment: Needs assistance;History of Falls Sitting-balance support: Feet supported Sitting balance-Leahy Scale: Fair     Standing balance support: Bilateral upper extremity supported Standing balance-Leahy Scale: Poor Standing balance comment: Pt reliant on bil UE support and min guard                             Cognition Arousal/Alertness: Awake/alert Behavior During Therapy: Flat affect Overall Cognitive Status: Impaired/Different from baseline Area of Impairment: Attention;Following commands;Awareness;Problem solving                   Current Attention Level: Selective   Following Commands: Follows multi-step commands with increased time   Awareness: Emergent Problem Solving: Slow processing;Difficulty sequencing;Decreased  initiation;Requires verbal cues;Requires tactile cues General Comments: multimodal cues for safety during transfers       Exercises       General Comments        Pertinent Vitals/Pain Pain Assessment: No/denies pain Faces Pain Scale: No hurt    Home Living                      Prior Function            PT Goals (current goals can now be found in the care plan section) Acute Rehab PT Goals Patient Stated Goal: to go home  PT Goal Formulation: With patient Time For Goal Achievement: 02/18/17 Potential to Achieve Goals: Good Progress towards PT goals: Progressing toward goals    Frequency    Min 3X/week      PT Plan Current plan remains appropriate    Co-evaluation              AM-PAC PT "6 Clicks" Daily Activity  Outcome Measure  Difficulty turning over in bed (including adjusting bedclothes, sheets and blankets)?: Unable Difficulty moving from lying on back to sitting on the side of the bed? : Unable Difficulty sitting down on and standing up from a chair with arms (e.g., wheelchair, bedside commode, etc,.)?: Unable Help needed moving to and from a bed to chair (including a wheelchair)?: A Little Help needed walking in hospital room?: A Little Help needed climbing 3-5 steps with a railing? : A Lot 6 Click Score: 11    End of Session Equipment Utilized During Treatment: Gait belt Activity Tolerance: Patient limited by fatigue Patient left: in bed;with call bell/phone within reach;with bed alarm set;with family/visitor present Nurse Communication: Mobility status PT Visit Diagnosis: Other abnormalities of gait and mobility (R26.89);Other symptoms and signs involving the nervous system (R29.898)     Time: 8325-4982 PT Time Calculation (min) (ACUTE ONLY): 18 min  Charges:  $Therapeutic Activity: 8-22 mins                    G Codes:       [place], [place], [place] 02/13/2017, 4:23 PM

## 2017-02-13 NOTE — Progress Notes (Signed)
I met with pt at bedside with OT and CNA. He is up to chair. Lots of gas in colostomy with small amount brown liquid. I contacted Dr. Erlinda Hong to discuss. We await stool in pouch before admit to CIR. Hopeful for Thursday. [phone]

## 2017-02-13 NOTE — Consult Note (Signed)
           [facility] CM Primary Care Navigator  02/13/2017  Terry Wu [birth year] [id number]   Went to see patient at the bedside to identify possible discharge needs but therapist is in the room currently working with patient.   Will attempt to see patient again at another time when available in the room.       Addendum:   Went back to see patient after therapy session to identify possible discharge needs. Patient is sitting up on the recliner.  Hereports having persistent bouts of diarrhea and getting weak that led to this admission.  Patient endorses Dr. Sinda [facility] as his primary care provider.  Patient verbalized using [facility] in [place] to obtain medications without difficulty.   Patient's wife Terry Wu) ismanagingmedications for patient at home.  Wife providestransportation to hisdoctors'appointments as well.  Patient's wife is the primary caregiver at home. Patient mentioned that daughter Terry Wu) lives with them and assists with needs at home inspite eyesight issue.    Patient's RN reports that [facility] per therapy recommendation prior to returning home.  Patient voiced understanding to call primary care provider's officewhen hereturns backhome,for a post discharge follow-up within a week or sooner if needs arise.Patient letter (with PCP's contact number) was provided as areminder.   Discussed with patient regarding [facility] CM services available for health managementat home.  He expressed understanding to seekreferral  from primary care provider to [facility] care management ifdeemed necessary and appropriatefor services in the future.   [facility] care management information was provided for future needs that he may have.   For questions, please contact:  [name], BSN, RN- [facility] Primary Care Navigator  Telephone: [phone] [name]

## 2017-02-14 ENCOUNTER — Inpatient Hospital Stay (HOSPITAL_COMMUNITY)
Admission: RE | Admit: 2017-02-14 | Discharge: 2017-02-27 | DRG: 945 | Disposition: A | Discharge status: Home Health | Payer: Medicare Other | Source: Intra-hospital | Attending: Physical Medicine & Rehabilitation | Admitting: Physical Medicine & Rehabilitation

## 2017-02-14 ENCOUNTER — Encounter (HOSPITAL_COMMUNITY): Payer: Self-pay | Admitting: *Deleted

## 2017-02-14 DIAGNOSIS — E8770 Fluid overload, unspecified: Secondary | ICD-10-CM | POA: Diagnosis not present

## 2017-02-14 DIAGNOSIS — E782 Mixed hyperlipidemia: Secondary | ICD-10-CM | POA: Diagnosis present

## 2017-02-14 DIAGNOSIS — Z952 Presence of prosthetic heart valve: Secondary | ICD-10-CM | POA: Diagnosis not present

## 2017-02-14 DIAGNOSIS — I872 Venous insufficiency (chronic) (peripheral): Secondary | ICD-10-CM | POA: Diagnosis present

## 2017-02-14 DIAGNOSIS — R4182 Altered mental status, unspecified: Secondary | ICD-10-CM | POA: Diagnosis not present

## 2017-02-14 DIAGNOSIS — I158 Other secondary hypertension: Secondary | ICD-10-CM | POA: Diagnosis not present

## 2017-02-14 DIAGNOSIS — I69322 Dysarthria following cerebral infarction: Secondary | ICD-10-CM | POA: Diagnosis not present

## 2017-02-14 DIAGNOSIS — R0602 Shortness of breath: Secondary | ICD-10-CM

## 2017-02-14 DIAGNOSIS — I482 Chronic atrial fibrillation: Secondary | ICD-10-CM | POA: Diagnosis present

## 2017-02-14 DIAGNOSIS — Z85038 Personal history of other malignant neoplasm of large intestine: Secondary | ICD-10-CM

## 2017-02-14 DIAGNOSIS — R32 Unspecified urinary incontinence: Secondary | ICD-10-CM | POA: Diagnosis not present

## 2017-02-14 DIAGNOSIS — Z885 Allergy status to narcotic agent status: Secondary | ICD-10-CM

## 2017-02-14 DIAGNOSIS — Z8546 Personal history of malignant neoplasm of prostate: Secondary | ICD-10-CM | POA: Diagnosis not present

## 2017-02-14 DIAGNOSIS — I5032 Chronic diastolic (congestive) heart failure: Secondary | ICD-10-CM | POA: Diagnosis present

## 2017-02-14 DIAGNOSIS — E669 Obesity, unspecified: Secondary | ICD-10-CM | POA: Diagnosis present

## 2017-02-14 DIAGNOSIS — E1169 Type 2 diabetes mellitus with other specified complication: Secondary | ICD-10-CM | POA: Diagnosis not present

## 2017-02-14 DIAGNOSIS — Z951 Presence of aortocoronary bypass graft: Secondary | ICD-10-CM

## 2017-02-14 DIAGNOSIS — D329 Benign neoplasm of meninges, unspecified: Secondary | ICD-10-CM | POA: Diagnosis present

## 2017-02-14 DIAGNOSIS — Z79899 Other long term (current) drug therapy: Secondary | ICD-10-CM

## 2017-02-14 DIAGNOSIS — J9811 Atelectasis: Secondary | ICD-10-CM | POA: Diagnosis present

## 2017-02-14 DIAGNOSIS — Z9049 Acquired absence of other specified parts of digestive tract: Secondary | ICD-10-CM

## 2017-02-14 DIAGNOSIS — T814XXS Infection following a procedure, sequela: Secondary | ICD-10-CM | POA: Diagnosis not present

## 2017-02-14 DIAGNOSIS — R410 Disorientation, unspecified: Secondary | ICD-10-CM

## 2017-02-14 DIAGNOSIS — K219 Gastro-esophageal reflux disease without esophagitis: Secondary | ICD-10-CM | POA: Diagnosis present

## 2017-02-14 DIAGNOSIS — I63512 Cerebral infarction due to unspecified occlusion or stenosis of left middle cerebral artery: Secondary | ICD-10-CM | POA: Diagnosis not present

## 2017-02-14 DIAGNOSIS — Z933 Colostomy status: Secondary | ICD-10-CM

## 2017-02-14 DIAGNOSIS — Z96653 Presence of artificial knee joint, bilateral: Secondary | ICD-10-CM | POA: Diagnosis present

## 2017-02-14 DIAGNOSIS — I2510 Atherosclerotic heart disease of native coronary artery without angina pectoris: Secondary | ICD-10-CM | POA: Diagnosis present

## 2017-02-14 DIAGNOSIS — L03311 Cellulitis of abdominal wall: Secondary | ICD-10-CM | POA: Diagnosis present

## 2017-02-14 DIAGNOSIS — R0902 Hypoxemia: Secondary | ICD-10-CM

## 2017-02-14 DIAGNOSIS — D62 Acute posthemorrhagic anemia: Secondary | ICD-10-CM | POA: Diagnosis present

## 2017-02-14 DIAGNOSIS — Z6835 Body mass index (BMI) 35.0-35.9, adult: Secondary | ICD-10-CM

## 2017-02-14 DIAGNOSIS — I719 Aortic aneurysm of unspecified site, without rupture: Secondary | ICD-10-CM | POA: Diagnosis present

## 2017-02-14 DIAGNOSIS — Z823 Family history of stroke: Secondary | ICD-10-CM

## 2017-02-14 DIAGNOSIS — I6521 Occlusion and stenosis of right carotid artery: Secondary | ICD-10-CM | POA: Diagnosis present

## 2017-02-14 DIAGNOSIS — T8149XA Infection following a procedure, other surgical site, initial encounter: Secondary | ICD-10-CM

## 2017-02-14 DIAGNOSIS — I110 Hypertensive heart disease with heart failure: Secondary | ICD-10-CM | POA: Diagnosis present

## 2017-02-14 DIAGNOSIS — E119 Type 2 diabetes mellitus without complications: Secondary | ICD-10-CM | POA: Diagnosis present

## 2017-02-14 DIAGNOSIS — Z7901 Long term (current) use of anticoagulants: Secondary | ICD-10-CM

## 2017-02-14 DIAGNOSIS — R5381 Other malaise: Principal | ICD-10-CM | POA: Diagnosis present

## 2017-02-14 DIAGNOSIS — I481 Persistent atrial fibrillation: Secondary | ICD-10-CM | POA: Diagnosis present

## 2017-02-14 DIAGNOSIS — Z953 Presence of xenogenic heart valve: Secondary | ICD-10-CM

## 2017-02-14 DIAGNOSIS — I10 Essential (primary) hypertension: Secondary | ICD-10-CM

## 2017-02-14 DIAGNOSIS — Z886 Allergy status to analgesic agent status: Secondary | ICD-10-CM

## 2017-02-14 LAB — BASIC METABOLIC PANEL
ANION GAP: 4 — AB (ref 5–15)
BUN: 6 mg/dL (ref 6–20)
CALCIUM: 8.2 mg/dL — AB (ref 8.9–10.3)
CO2: 32 mmol/L (ref 22–32)
Chloride: 100 mmol/L — ABNORMAL LOW (ref 101–111)
Creatinine, Ser: 0.69 mg/dL (ref 0.61–1.24)
GFR calc Af Amer: >60 mL/min (ref >60)
GFR calc non Af Amer: >60 mL/min (ref >60)
GLUCOSE: 175 mg/dL — AB (ref 65–99)
POTASSIUM: 4.1 mmol/L (ref 3.5–5.1)
SODIUM: 136 mmol/L (ref 135–145)

## 2017-02-14 LAB — MAGNESIUM: Magnesium: 1.9 mg/dL (ref 1.7–2.4)

## 2017-02-14 LAB — CBC
HCT: 27.4 % — ABNORMAL LOW (ref 39.0–52.0)
Hemoglobin: 8.7 g/dL — ABNORMAL LOW (ref 13.0–17.0)
MCH: 30.3 pg (ref 26.0–34.0)
MCHC: 31.8 g/dL (ref 30.0–36.0)
MCV: 95.5 fL (ref 78.0–100.0)
PLATELETS: 201 10*3/uL (ref 150–400)
RBC: 2.87 MIL/uL — AB (ref 4.22–5.81)
RDW: 13.6 % (ref 11.5–15.5)
WBC: 9.3 10*3/uL (ref 4.0–10.5)

## 2017-02-14 LAB — GLUCOSE, CAPILLARY
GLUCOSE-CAPILLARY: 149 mg/dL — AB (ref 65–99)
Glucose-Capillary: 189 mg/dL — ABNORMAL HIGH (ref 65–99)
Glucose-Capillary: 166 mg/dL — ABNORMAL HIGH (ref 65–99)
Glucose-Capillary: 175 mg/dL — ABNORMAL HIGH (ref 65–99)

## 2017-02-14 MED ORDER — POLYETHYLENE GLYCOL 3350 17 G PO PACK: 17.0000 g | PACK | Freq: Every day | ORAL | 0 refills | Status: AC

## 2017-02-14 MED ORDER — PANTOPRAZOLE SODIUM 40 MG PO TBEC
40.0000 mg | DELAYED_RELEASE_TABLET | Freq: Every day | ORAL | Status: DC
  Administered 2017-02-15 – 2017-02-26 (×12): 40 mg via ORAL
  Filled 2017-02-14 (×12): qty 1

## 2017-02-14 MED ORDER — FUROSEMIDE 40 MG PO TABS
40.0000 mg | ORAL_TABLET | Freq: Every day | ORAL | Status: DC
  Administered 2017-02-15 – 2017-02-20 (×6): 40 mg via ORAL
  Filled 2017-02-14 (×6): qty 1

## 2017-02-14 MED ORDER — APIXABAN 5 MG PO TABS: 5.0000 mg | ORAL_TABLET | Freq: Two times a day (BID) | ORAL | 0 refills | Status: DC

## 2017-02-14 MED ORDER — INSULIN ASPART 100 UNIT/ML ~~LOC~~ SOLN
0.0000 [IU] | Freq: Three times a day (TID) | SUBCUTANEOUS | Status: DC
  Administered 2017-02-14 – 2017-02-15 (×2): 3 [IU] via SUBCUTANEOUS
  Administered 2017-02-15 (×2): 2 [IU] via SUBCUTANEOUS
  Administered 2017-02-16 – 2017-02-17 (×4): 3 [IU] via SUBCUTANEOUS
  Administered 2017-02-17: 2 [IU] via SUBCUTANEOUS
  Administered 2017-02-17 – 2017-02-18 (×3): 3 [IU] via SUBCUTANEOUS
  Administered 2017-02-18: 2 [IU] via SUBCUTANEOUS
  Administered 2017-02-19 (×3): 3 [IU] via SUBCUTANEOUS
  Administered 2017-02-20 (×2): 2 [IU] via SUBCUTANEOUS
  Administered 2017-02-20: 3 [IU] via SUBCUTANEOUS
  Administered 2017-02-21 – 2017-02-24 (×3): 2 [IU] via SUBCUTANEOUS

## 2017-02-14 MED ORDER — ONDANSETRON HCL 4 MG PO TABS: 4.0000 mg | ORAL_TABLET | Freq: Four times a day (QID) | ORAL | Status: DC | PRN

## 2017-02-14 MED ORDER — ONDANSETRON HCL 4 MG/2ML IJ SOLN: 4.0000 mg | Freq: Four times a day (QID) | INTRAMUSCULAR | Status: DC | PRN

## 2017-02-14 MED ORDER — CARVEDILOL 3.125 MG PO TABS: 3.1250 mg | ORAL_TABLET | Freq: Two times a day (BID) | ORAL | 0 refills | Status: DC

## 2017-02-14 MED ORDER — POLYETHYLENE GLYCOL 3350 17 G PO PACK
17.0000 g | PACK | Freq: Every day | ORAL | Status: DC
  Administered 2017-02-15 – 2017-02-27 (×11): 17 g via ORAL
  Filled 2017-02-14 (×12): qty 1

## 2017-02-14 MED ORDER — INSULIN ASPART 100 UNIT/ML ~~LOC~~ SOLN
0.0000 [IU] | Freq: Every day | SUBCUTANEOUS | Status: DC
  Administered 2017-02-17: 3 [IU] via SUBCUTANEOUS

## 2017-02-14 MED ORDER — DORZOLAMIDE HCL-TIMOLOL MAL 2-0.5 % OP SOLN
1.0000 [drp] | Freq: Two times a day (BID) | OPHTHALMIC | Status: DC
  Administered 2017-02-14 – 2017-02-27 (×26): 1 [drp] via OPHTHALMIC
  Filled 2017-02-14: qty 10

## 2017-02-14 MED ORDER — ATORVASTATIN CALCIUM 20 MG PO TABS
20.0000 mg | ORAL_TABLET | Freq: Every day | ORAL | Status: DC
  Administered 2017-02-14 – 2017-02-26 (×13): 20 mg via ORAL
  Filled 2017-02-14 (×13): qty 1

## 2017-02-14 MED ORDER — SORBITOL 70 % SOLN: 30.0000 mL | Freq: Every day | Status: DC | PRN

## 2017-02-14 MED ORDER — CARVEDILOL 3.125 MG PO TABS
3.1250 mg | ORAL_TABLET | Freq: Two times a day (BID) | ORAL | Status: DC
  Administered 2017-02-14 – 2017-02-27 (×25): 3.125 mg via ORAL
  Filled 2017-02-14 (×26): qty 1

## 2017-02-14 MED ORDER — APIXABAN 5 MG PO TABS
5.0000 mg | ORAL_TABLET | Freq: Two times a day (BID) | ORAL | Status: DC
  Administered 2017-02-14 – 2017-02-27 (×26): 5 mg via ORAL
  Filled 2017-02-14 (×26): qty 1

## 2017-02-14 NOTE — Progress Notes (Signed)
Report given to Marjorie Smolder, RN in inpatient rehab. Pt ready for transfer to rm [address] on [place].

## 2017-02-14 NOTE — Progress Notes (Signed)
[facility] Progress Note  7 Days Post-Op  Subjective: CC:  No complaints. Denies abdominal pain. Tolerating PO. Not participating in his pouch changes with WOC/nursing staff. Mobilizing with therapies. Having flatus in pouch. Unsure if he is having stool in pouch.  Objective: Vital signs in last 24 hours: Temp:  [97.9 F (36.6 C)-98.4 F (36.9 C)] 97.9 F (36.6 C) (08/23 0549) Pulse Rate:  [80-90] 86 (08/23 0549) Resp:  [18] 18 (08/23 0549) BP: (115-127)/(56-61) 115/61 (08/23 0549) SpO2:  [97 %-98 %] 97 % (08/23 0549) Last BM Date: 02/12/17  Intake/Output from previous day: 08/22 0701 - 08/23 0700 In: 120 [P.O.:120] Out: 2100 [Urine:2100] Intake/Output this shift: No intake/output data recorded.  PE: Gen:  Alert, NAD, pleasant Pulm:  Normal effort Abd: Soft, protuberant, non-tender, bowel sounds present in all 4 quadrants, ostomy pouch just changed - flat. Stoma viable. Skin: warm and dry, no rashes  Psych: A&Ox3   Lab Results:   Recent Labs  02/13/17 0430 02/14/17 0401  WBC 8.4 9.3  HGB 9.1* 8.7*  HCT 28.6* 27.4*  PLT 181 201   BMET  Recent Labs  02/12/17 0307 02/14/17 0401  NA 138 136  K 4.3 4.1  CL 106 100*  CO2 29 32  GLUCOSE 166* 175*  BUN 11 6  CREATININE 0.76 0.69  CALCIUM 8.3* 8.2*   PT/INR No results for input(s): LABPROT, INR in the last 72 hours. CMP     Component Value Date/Time   NA 136 02/14/2017 0401   K 4.1 02/14/2017 0401   CL 100 (L) 02/14/2017 0401   CO2 32 02/14/2017 0401   GLUCOSE 175 (H) 02/14/2017 0401   BUN 6 02/14/2017 0401   CREATININE 0.69 02/14/2017 0401   CREATININE 0.97 09/22/2015 1139   CALCIUM 8.2 (L) 02/14/2017 0401   PROT 5.3 (L) 02/12/2017 0307   ALBUMIN 2.3 (L) 02/12/2017 0307   AST 13 (L) 02/12/2017 0307   ALT 17 02/12/2017 0307   ALKPHOS 25 (L) 02/12/2017 0307   BILITOT 0.8 02/12/2017 0307   GFRNONAA >60 02/14/2017 0401   GFRAA >60 02/14/2017 0401   Lipase  No results found for:  LIPASE     Studies/Results: No results found.  Anti-infectives: Anti-infectives    Start     Dose/Rate Route Frequency Ordered Stop   02/07/17 1418  cefoTEtan in Dextrose 5% (CEFOTAN) 2-2.08 GM-% IVPB  Status:  Discontinued    Comments:  Schonewitz, Leigh   : cabinet override      02/07/17 1418 02/07/17 1519   02/07/17 1015  cefoTEtan (CEFOTAN) 1 g in dextrose 5 % 50 mL IVPB     1 g 100 mL/hr over 30 Minutes Intravenous  Once 02/07/17 1012 02/07/17 1241       Assessment/Plan POD#7 S/p Hartmann's procedure and suture repair of umbilical hernia 8/52 Dr. Donnie Mesa - path showing T2N0 adenocarcinoma, recommend outpatient oncology follow up. - tolerating dys 3 diet, advance as toleratedper SLP recommendations - some erythema around midline staples - no active drainage, WBC normal, afebrile. Follow. - having large amounts of flatus in ostomy pouch. - Mobilize with PT - incentive spirometry  - stable for discharge to CIR from surgical perspective.  FEN: dys 3 diet VTE: SCD's, Eliquis Follow up: staple removal 8/30 @ [place] office, post-op F/U Dr. Georgette Dover 3-4 weeks.   LOS: 15 days    Jill Alexanders , [facility] Surgery 02/14/2017, 10:10 AM Pager: [phone] Consults: [phone] Mon-Fri 7:00 am-4:30 pm Sat-Sun  7:00 am-11:30 am

## 2017-02-14 NOTE — Progress Notes (Signed)
Meredith Staggers, MD Physician Signed Physical Medicine and Rehabilitation  Consult Note Date of Service: 02/11/2017 1:48 PM  Related encounter: Admission (Current) from 01/30/2017 in [facility] Collapse All   [] Hide copied text [] Hover for attribution information      Physical Medicine and Rehabilitation Consult Reason for Consult: Decreased functional mobility Referring Physician: [facility]   HPI: Terry Wu is a 83 y.o. right handed male with history of aortic aneurysm, atrial fibrillation, CAD with CABG/AVR maintained on chronic Coumadin, hypertension and type 2 diabetes mellitus. Per chart review patient lives with spouse. Independent prior to admission and still driving up until recently. Presented 01/30/2017 with diarrhea 2 weeks and increasing shortness of breath with ambulation. He denied any abdominal pain or rectal bleeding. CT of abdomen and pelvis showed no acute intra-abdominal or intrapelvic process identified. Clostridium difficile specimen negative. Underwent colonoscopy 02/05/2017 per gastroenterology that showed proximal sigmoid colon, mid sigmoid colon and descending colon to be normal. There was a malignant tumor in the distal sigmoid colon that was biopsied. Later underwent sigmoid colectomy with creation of descending colostomy after new diagnosis of adenocarcinoma per biopsy 02/07/2017 per general surgery. Patient was extubated 02/09/2007. MRI of the brain due to postoperative confusion showed patchy acute small left MCA territory infarcts. 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 MRA with no emergent large vessel occlusion. Carotid Dopplers had no ICA stenosis. His chronic Coumadin currently is on hold and was transitioned to Eliquis per follow-up of cardiology services. Tolerating a mechanical soft diet. Occupational therapy evaluation completed with recommendations of physical medicine rehabilitation consult.   Review of  Systems  Constitutional: Negative for chills and fever.  HENT: Negative for hearing loss.   Eyes: Negative for blurred vision and double vision.  Respiratory: Positive for shortness of breath. Negative for cough.   Cardiovascular: Positive for palpitations and leg swelling. Negative for chest pain.  Gastrointestinal: Positive for diarrhea and nausea.       GERD  Genitourinary: Negative for dysuria.  Musculoskeletal: Positive for joint pain and myalgias.  Skin: Negative for rash.  Neurological: Positive for dizziness and weakness. Negative for seizures.  All other systems reviewed and are negative.      Past Medical History:  Diagnosis Date  . Aortic aneurysm (HCC)    Ascending thoracic - 4.8 cm by CT 2017  . Aortic regurgitation    Bioprosthetic AVR 2006  . Atrial fibrillation with controlled ventricular response ([place]) 08/22/2015   CHADS2VASC=5, on Coumadin  . CAD (coronary artery disease)    Multivessel status post CABG 2006  . Complication of anesthesia   . Esophageal reflux   . Essential hypertension   . History of dizziness   . Mixed hyperlipidemia   . Type 2 diabetes mellitus ([place])         Past Surgical History:  Procedure Laterality Date  . AORTIC VALVE REPLACEMENT  2006   [facility] Pericardial tissue valve  . COLON RESECTION SIGMOID N/A 02/07/2017   Procedure: SIGMOID COLECTOMY WITH COLOSTOMY;  Surgeon: Donnie Mesa, MD;  Location: [place];  Service: General;  Laterality: N/A;  . COLONOSCOPY N/A 02/05/2017   Procedure: COLONOSCOPY;  Surgeon: Rogene Houston, MD;  [facility];  Service: Endoscopy;  Laterality: N/A;  . CORONARY ARTERY BYPASS GRAFT  2006   LIMA to LAD, SVG to diagonal, SVG to OM, SVG to PDA   . HERNIA REPAIR    . TOTAL KNEE ARTHROPLASTY Bilateral   .  UMBILICAL HERNIA REPAIR N/A 02/07/2017   Procedure: HERNIA REPAIR UMBILICAL ADULT;  Surgeon: Donnie Mesa, MD;  Location: [facility] OR;  Service: General;  Laterality: N/A;          Family History  Problem Relation Age of Onset  . Heart Problems Mother   . Stroke Father   . Cancer Unknown    Social History:  reports that he has never smoked. He has never used smokeless tobacco. He reports that he does not drink alcohol or use drugs. Allergies:       Allergies  Allergen Reactions  . Aspirin     Unknown, per pt  . Demerol     Unknown, per pt         Medications Prior to Admission  Medication Sig Dispense Refill  . amLODipine (NORVASC) 10 MG tablet TAKE ONE TABLET BY MOUTH ONCE DAILY 90 tablet 1  . amoxicillin (AMOXIL) 500 MG capsule Take 2,000 mg by mouth as directed.     [name] atorvastatin (LIPITOR) 20 MG tablet TAKE ONE TABLET BY MOUTH ONCE DAILY 90 tablet 3  . carvedilol (COREG) 12.5 MG tablet TAKE ONE TABLET BY MOUTH TWICE DAILY 180 tablet 3  . Coenzyme Q10 (CO Q 10) 100 MG CAPS Take 1 capsule by mouth every morning.    . dorzolamide-timolol (COSOPT) 22.3-6.8 MG/ML ophthalmic solution Place 1 drop into both eyes 2 (two) times daily.    [name] glimepiride (AMARYL) 2 MG tablet Take 2 mg by mouth daily before breakfast.     . magnesium oxide (MAG-OX) 400 MG tablet Take 400 mg by mouth daily.    . Multiple Vitamins-Minerals (ICAPS PO) Take 2 tablets by mouth 3 (three) times daily after meals.     . pantoprazole (PROTONIX) 40 MG tablet Take 1 tablet (40 mg total) by mouth daily. 90 tablet 3  . potassium chloride SA (K-DUR,KLOR-CON) 20 MEQ tablet Take 20 mEq by mouth daily.     [name] spironolactone (ALDACTONE) 25 MG tablet TAKE ONE TABLET BY MOUTH ONCE DAILY 45 tablet 3  . telmisartan (MICARDIS) 40 MG tablet Take 40 mg by mouth daily.    [name] torsemide (DEMADEX) 100 MG tablet Take 50 mg by mouth daily.     [name] warfarin (COUMADIN) 5 MG tablet Take 1 tablet daily except 1/2 tablet on Mondays, Wednesdays and Fridays (Patient taking differently: Take 1 tablet daily except 1/2 tablet on sundays, Tuesday, thursday and saturdays) 30 tablet 12     Home: [place] expects to be discharged to:: Private residence Living Arrangements: Spouse/significant other Available Help at Discharge: Family, Available 24 hours/day Type of Home: House Home Access: Level entry (into bottom level) Home Layout: Multi-level Alternate Level Stairs-Number of Steps: 7 Alternate Level Stairs-Rails: Right Bathroom Shower/Tub: Multimedia programmer: Standard Home Equipment: Environmental consultant - standard  Lives With: Spouse  Functional History: Prior Function Level of Independence: Independent Comments: Pt reports he was fully independent PTA, including driving.  He endorses fall in July  Functional Status:  Mobility: Bed Mobility Overal bed mobility: Needs Assistance Bed Mobility: Supine to Sit Supine to sit: Mod assist, HOB elevated Sit to supine: Min guard General bed mobility comments: Pt requires cues for sequencing and assist to move LEs off EOB and to lift trunk  Transfers Overall transfer level: Needs assistance Equipment used: Rolling walker (2 wheeled) Transfers: Sit to/from Stand, [employer] Transfers Sit to Stand: Min assist, +2 physical assistance, +2 safety/equipment Stand pivot transfers: Min assist, +2 physical assistance, +2  safety/equipment General transfer comment: Pt requires assist to power up into standing and assist to steady  Ambulation/Gait Ambulation/Gait assistance: Supervision Ambulation Distance (Feet): 10 Feet Assistive device: Standard walker Gait Pattern/deviations: Decreased step length - right, Decreased step length - left, Decreased dorsiflexion - left, Decreased dorsiflexion - right General Gait Details: ambulated within room, patient unwilling to ambulate in hallway today   ADL: ADL Overall ADL's : Needs assistance/impaired Eating/Feeding: NPO Grooming: Wash/dry hands, Wash/dry face, Oral care, Brushing hair, Set up, Sitting Upper Body Bathing: Minimal assistance, Sitting Lower Body  Bathing: Maximal assistance, Sit to/from stand Upper Body Dressing : Moderate assistance, Sitting Lower Body Dressing: Total assistance, Sit to/from stand Toilet Transfer: Minimal assistance, +2 for physical assistance, +2 for safety/equipment, Stand-pivot, BSC, RW Toileting- Clothing Manipulation and Hygiene: Maximal assistance, Sit to/from stand Functional mobility during ADLs: Minimal assistance, +2 for physical assistance, +2 for safety/equipment, Rolling walker General ADL Comments: Pt limited by pain   Cognition: Cognition Overall Cognitive Status: Impaired/Different from baseline Arousal/Alertness: Awake/alert Orientation Level: Oriented X4 Attention: Sustained Sustained Attention: Appears intact Cognition Arousal/Alertness: Awake/alert Behavior During Therapy: Flat affect Overall Cognitive Status: Impaired/Different from baseline Area of Impairment: Attention, Following commands, Awareness, Problem solving Current Attention Level: Selective Following Commands: Follows multi-step commands with increased time Awareness: Intellectual Problem Solving: Slow processing, Difficulty sequencing, Decreased initiation, Requires verbal cues  Blood pressure 126/77, pulse 84, temperature (!) 97.5 F (36.4 C), temperature source Oral, resp. rate (!) 23, height 5\' 9"  (1.753 m), weight 113 kg (249 lb 3.2 oz), SpO2 98 %. Physical Exam  Vitals reviewed. HENT:  Head: Normocephalic.  Eyes: EOM are normal.  Neck: Normal range of motion. Neck supple. No thyromegaly present.  Cardiovascular:  Cardiac rate controlled  Respiratory: Effort normal and breath sounds normal. No respiratory distress.  GI: Soft. Bowel sounds are normal. He exhibits distension.  Colostomy in place  Neurological:  Mood is a bit flat but appropriate. Makes good eye contact and follows simple commands. Provides his name and age as well as place    Lab Results Last 24 Hours       Results for orders placed or  performed during the hospital encounter of 01/30/17 (from the past 24 hour(s))  Glucose, capillary     Status: Abnormal   Collection Time: 02/10/17  4:08 PM  Result Value Ref Range   Glucose-Capillary 128 (H) 65 - 99 mg/dL   Comment 1 Capillary Specimen    Comment 2 Notify RN   Heparin level (unfractionated)     Status: None   Collection Time: 02/10/17  7:44 PM  Result Value Ref Range   Heparin Unfractionated 0.39 0.30 - 0.70 IU/mL  Glucose, capillary     Status: Abnormal   Collection Time: 02/10/17  8:28 PM  Result Value Ref Range   Glucose-Capillary 151 (H) 65 - 99 mg/dL   Comment 1 Notify RN   Glucose, capillary     Status: Abnormal   Collection Time: 02/10/17 10:01 PM  Result Value Ref Range   Glucose-Capillary 138 (H) 65 - 99 mg/dL   Comment 1 Notify RN   Comprehensive metabolic panel     Status: Abnormal   Collection Time: 02/11/17  2:11 AM  Result Value Ref Range   Sodium 139 135 - 145 mmol/L   Potassium 4.1 3.5 - 5.1 mmol/L   Chloride 107 101 - 111 mmol/L   CO2 27 22 - 32 mmol/L   Glucose, Bld 141 (H) 65 - 99 mg/dL  BUN 12 6 - 20 mg/dL   Creatinine, Ser 0.78 0.61 - 1.24 mg/dL   Calcium 8.3 (L) 8.9 - 10.3 mg/dL   Total Protein 5.4 (L) 6.5 - 8.1 g/dL   Albumin 2.4 (L) 3.5 - 5.0 g/dL   AST 13 (L) 15 - 41 U/L   ALT 17 17 - 63 U/L   Alkaline Phosphatase 24 (L) 38 - 126 U/L   Total Bilirubin 1.0 0.3 - 1.2 mg/dL   GFR calc non Af Amer >60 >60 mL/min   GFR calc Af Amer >60 >60 mL/min   Anion gap 5 5 - 15  Heparin level (unfractionated)     Status: None   Collection Time: 02/11/17  2:11 AM  Result Value Ref Range   Heparin Unfractionated 0.39 0.30 - 0.70 IU/mL  CBC     Status: Abnormal   Collection Time: 02/11/17  2:11 AM  Result Value Ref Range   WBC 7.9 4.0 - 10.5 K/uL   RBC 2.85 (L) 4.22 - 5.81 MIL/uL   Hemoglobin 8.7 (L) 13.0 - 17.0 g/dL   HCT 27.2 (L) 39.0 - 52.0 %   MCV 95.4 78.0 - 100.0 fL   MCH 30.5 26.0 - 34.0 pg    MCHC 32.0 30.0 - 36.0 g/dL   RDW 13.7 11.5 - 15.5 %   Platelets 161 150 - 400 K/uL  Glucose, capillary     Status: Abnormal   Collection Time: 02/11/17  8:35 AM  Result Value Ref Range   Glucose-Capillary 106 (H) 65 - 99 mg/dL  Glucose, capillary     Status: Abnormal   Collection Time: 02/11/17 11:41 AM  Result Value Ref Range   Glucose-Capillary 154 (H) 65 - 99 mg/dL   Comment 1 Capillary Specimen       Imaging Results (Last 48 hours)  [facility] Wo Contrast  Result Date: 02/10/2017 CLINICAL DATA:  Encephalopathy. Follow-up stroke. History of hypertension, hyperlipidemia, atrial fibrillation. EXAM: MRA HEAD WITHOUT CONTRAST MRA NECK WITHOUT CONTRAST TECHNIQUE: Angiographic images of the Circle of Willis were obtained using MRA technique without intravenous contrast. Angiographic images of the neck were obtained using MRA technique without intravenous contrast. Carotid stenosis measurements (when applicable) are obtained utilizing NASCET criteria, using the distal internal carotid diameter as the denominator. COMPARISON:  MRI of the head February 09, 2017 FINDINGS: MRA HEAD FINDINGS ANTERIOR CIRCULATION: Normal flow related enhancement of the included cervical, petrous, cavernous and supraclinoid internal carotid arteries. Patent anterior communicating artery. Patent anterior and middle cerebral arteries, including distal segments. No large vessel occlusion, high-grade stenosis, aneurysm. POSTERIOR CIRCULATION: LEFT vertebral artery is dominant. Basilar artery is patent, with normal flow related enhancement of the main branch vessels. Severe stenosis distal RIGHT V4 segment after the RIGHT posterior-inferior cerebellar artery takeoff. Moderate stenosis mid basilar artery. Patent posterior cerebral arteries. Robust LEFT posterior communicating artery present. No large vessel occlusion, high-grade stenosis,  aneurysm. ANATOMIC VARIANTS: None. Source images and MIP images were reviewed.  MRA NECK FINDINGS- generalized poor signal ANTERIOR CIRCULATION: Due to time-of-flight technique arch vessel origins not characterize. Common carotid artery's of patent bilaterally. Potentially hemodynamically significant RIGHT internal carotid artery stenosis, not quantifiable. LEFT internal carotid artery appears widely patent. POSTERIOR CIRCULATION: Bilateral vertebral arteries are patent to the vertebrobasilar junction. No definite flow limiting stenosis or luminal irregularity. Source images and MIP images were reviewed. IMPRESSION: MRI head: 1. No emergent large vessel occlusion. 2. No significant stenosis anterior circulation. Severe stenosis RIGHT V4 segment and moderate stenosis basilar  artery. MRA NECK: 1. Technically limited. Suspected potentially hemodynamically significant stenosis RIGHT internal carotid artery origin. Electronically Signed   By: Elon Alas M.D.   On: 02/10/2017 05:03   [name] Neck Wo Contrast  Result Date: 02/10/2017 CLINICAL DATA:  Encephalopathy. Follow-up stroke. History of hypertension, hyperlipidemia, atrial fibrillation. EXAM: MRA HEAD WITHOUT CONTRAST MRA NECK WITHOUT CONTRAST TECHNIQUE: Angiographic images of the Circle of Willis were obtained using MRA technique without intravenous contrast. Angiographic images of the neck were obtained using MRA technique without intravenous contrast. Carotid stenosis measurements (when applicable) are obtained utilizing NASCET criteria, using the distal internal carotid diameter as the denominator. COMPARISON:  MRI of the head February 09, 2017 FINDINGS: MRA HEAD FINDINGS ANTERIOR CIRCULATION: Normal flow related enhancement of the included cervical, petrous, cavernous and supraclinoid internal carotid arteries. Patent anterior communicating artery. Patent anterior and middle cerebral arteries, including distal segments. No large vessel occlusion, high-grade stenosis, aneurysm. POSTERIOR CIRCULATION: LEFT vertebral artery is  dominant. Basilar artery is patent, with normal flow related enhancement of the main branch vessels. Severe stenosis distal RIGHT V4 segment after the RIGHT posterior-inferior cerebellar artery takeoff. Moderate stenosis mid basilar artery. Patent posterior cerebral arteries. Robust LEFT posterior communicating artery present. No large vessel occlusion, high-grade stenosis,  aneurysm. ANATOMIC VARIANTS: None. Source images and MIP images were reviewed. MRA NECK FINDINGS- generalized poor signal ANTERIOR CIRCULATION: Due to time-of-flight technique arch vessel origins not characterize. Common carotid artery's of patent bilaterally. Potentially hemodynamically significant RIGHT internal carotid artery stenosis, not quantifiable. LEFT internal carotid artery appears widely patent. POSTERIOR CIRCULATION: Bilateral vertebral arteries are patent to the vertebrobasilar junction. No definite flow limiting stenosis or luminal irregularity. Source images and MIP images were reviewed. IMPRESSION: MRI head: 1. No emergent large vessel occlusion. 2. No significant stenosis anterior circulation. Severe stenosis RIGHT V4 segment and moderate stenosis basilar artery. MRA NECK: 1. Technically limited. Suspected potentially hemodynamically significant stenosis RIGHT internal carotid artery origin. Electronically Signed   By: Elon Alas M.D.   On: 02/10/2017 05:03     Assessment/Plan: Diagnosis: encephalopathy/debility after colectomy/colon cancer, multiple associated medical issues 1. Does the need for close, 24 hr/day medical supervision in concert with the patient's rehab needs make it unreasonable for this patient to be served in a less intensive setting? Yes 2. Co-Morbidities requiring supervision/potential complications: nutrition, wound care, copd 3. Due to bladder management, bowel management, safety, skin/wound care, disease management, medication administration, pain management and patient education, does the  patient require 24 hr/day rehab nursing? Yes 4. Does the patient require coordinated care of a physician, rehab nurse, PT (1-2 hrs/day, 5 days/week), OT (1-2 hrs/day, 5 days/week) and SLP (1-2 hrs/day, 5 days/week) to address physical and functional deficits in the context of the above medical diagnosis(es)? Yes Addressing deficits in the following areas: balance, endurance, locomotion, strength, transferring, bowel/bladder control, bathing, dressing, feeding, grooming, toileting, cognition and psychosocial support 5. Can the patient actively participate in an intensive therapy program of at least 3 hrs of therapy per day at least 5 days per week? Yes 6. The potential for patient to make measurable gains while on inpatient rehab is excellent 7. Anticipated functional outcomes upon discharge from inpatient rehab are modified independent and supervision  with PT, modified independent and supervision with OT, modified independent and supervision with SLP. 8. Estimated rehab length of stay to reach the above functional goals is: 10-15 days 9. Anticipated D/C setting: Home 10. Anticipated post D/C treatments: [place] therapy 11. Overall Rehab/Functional Prognosis: excellent  RECOMMENDATIONS: This patient's condition is appropriate for continued rehabilitative care in the following setting: CIR Patient has agreed to participate in recommended program. Yes Note that insurance prior authorization may be required for reimbursement for recommended care.  Comment: Rehab Admissions Coordinator to follow up.  Thanks,  Meredith Staggers, MD, Mell[facility] Drown    Cathlyn Parsons., PA-C 02/11/2017    Revision History                        Routing History

## 2017-02-14 NOTE — Care Management Note (Signed)
Case Management Note  Patient Details  Name: RYZEN DEADY MRN: [id number] Date of Birth: 1931-07-08  Subjective/Objective:  CVA, incarcerated umbilical hernia, large sigmoid colon cancer                   Action/Plan: Discharge Planning: Chart reviewed. Scheduled dc today to IP rehab.   PCP Sinda Du MD  Expected Discharge Date:  02/14/17               Expected Discharge Plan:  [place]  In-House Referral:  Clinical Social Work  Discharge planning Services  CM Consult  Post Acute Care Choice:  NA Choice offered to:  NA  DME Arranged:  N/A DME Agency:  NA  HH Arranged:  NA HH Agency:  NA  Status of Service:  Completed, signed off  If discussed at [place] of Stay Meetings, dates discussed:  02/12/2017, 02/14/2017   Additional Comments:  Erenest Rasher, RN 02/14/2017, 2:56 PM

## 2017-02-14 NOTE — Progress Notes (Signed)
Physical Therapy Treatment Patient Details Name: Terry Wu MRN: [id number] DOB: 1932/03/29 Today's Date: 02/14/2017    History of Present Illness This 83 y.o. male admitted to [facility] 01/30/17 with intractable diarrhea and dehydration.  Colonoscopy revealed adenocarcinoma.  He was transferred to [facility] 8/15 and underwent signoid colectomy with creation of descenting colostomy.   Post operatively he was found to have had CVA - MRI showed Lt MCA territory infarcts, and multiple old cerebellar infarcts.  PMH includes:  A-Fib. CAD, s/p CABG/AVR. DM, Aortic aneurysm    PT Comments    Pt demonstrates improved tolerance for bed mobility, transfers and gait requiring a slight decrease in assistance to perform. Pt continues to benefit from follow-up at inpatient rehab in order to maximize his outcomes before returning home.     Follow Up Recommendations  CIR     Equipment Recommendations  Other (comment) (defer to next venue)    Recommendations for Other Services       Precautions / Restrictions Precautions Precautions: Fall Restrictions Weight Bearing Restrictions: No    Mobility  Bed Mobility Overal bed mobility: Needs Assistance Bed Mobility: Supine to Sit     Supine to sit: Min assist;+2 for physical assistance     General bed mobility comments: MIn A +2 for safety to bring trunk upright at EOB.   Transfers Overall transfer level: Needs assistance Equipment used: Rolling walker (2 wheeled) Transfers: Sit to/from Stand Sit to Stand: Min assist;+2 safety/equipment         General transfer comment: increased time, cueing for technique and hand placement, assist for stability with transition into standing from sitting EOB  Ambulation/Gait Ambulation/Gait assistance: Min guard Ambulation Distance (Feet): 40 Feet Assistive device: Rolling walker (2 wheeled) Gait Pattern/deviations: Step-through pattern;Decreased step length - right;Decreased step length - left;Trunk flexed Gait  velocity: decreased Gait velocity interpretation: Below normal speed for age/gender General Gait Details: modest instability and fatiguing quickly, min guard for safety with use of RW   Stairs            Wheelchair Mobility    Modified Rankin (Stroke Patients Only)       Balance Overall balance assessment: Needs assistance;History of Falls Sitting-balance support: Feet supported Sitting balance-Leahy Scale: Fair     Standing balance support: Bilateral upper extremity supported Standing balance-Leahy Scale: Poor Standing balance comment: Pt reliant on bil UE support and min guard                             Cognition Arousal/Alertness: Awake/alert Behavior During Therapy: Flat affect Overall Cognitive Status: Impaired/Different from baseline Area of Impairment: Attention;Following commands;Awareness;Problem solving                   Current Attention Level: Selective   Following Commands: Follows multi-step commands with increased time   Awareness: Emergent Problem Solving: Slow processing;Difficulty sequencing;Decreased initiation;Requires verbal cues;Requires tactile cues General Comments: multimodal cues for safety during transfers       Exercises      General Comments        Pertinent Vitals/Pain Pain Assessment: No/denies pain    Home Living                      Prior Function            PT Goals (current goals can now be found in the care plan section) Acute Rehab PT Goals Patient Stated Goal:  to go home  Progress towards PT goals: Progressing toward goals    Frequency    Min 3X/week      PT Plan Current plan remains appropriate    Co-evaluation              AM-PAC PT "6 Clicks" Daily Activity  Outcome Measure  Difficulty turning over in bed (including adjusting bedclothes, sheets and blankets)?: Unable Difficulty moving from lying on back to sitting on the side of the bed? : Unable Difficulty  sitting down on and standing up from a chair with arms (e.g., wheelchair, bedside commode, etc,.)?: Unable Help needed moving to and from a bed to chair (including a wheelchair)?: A Little Help needed walking in hospital room?: A Little Help needed climbing 3-5 steps with a railing? : A Lot 6 Click Score: 11    End of Session Equipment Utilized During Treatment: Gait belt Activity Tolerance: Patient limited by fatigue Patient left: in chair;with call bell/phone within reach;with chair alarm set Nurse Communication: Mobility status PT Visit Diagnosis: Other abnormalities of gait and mobility (R26.89);Other symptoms and signs involving the nervous system (R29.898)     Time: 1103-1594 PT Time Calculation (min) (ACUTE ONLY): 12 min  Charges:  $Gait Training: 8-22 mins                    G Codes:       Scheryl Marten PT, DPT  [phone]    Jacqulyn Liner Sloan Leiter 02/14/2017, 1:47 PM

## 2017-02-14 NOTE — Progress Notes (Signed)
Removed ~6 staples from central aspect of incision 2/2 progressive cellulitis/erythema. Incision opened and seropurulent material expressed. Wet-to-dry dressing placed with dampened 4x4 gauze and dry gauze on top. Start BID wet to dry dressing changes to midline wound. General surgery will follow wound while pt is at CIR.   Obie Dredge, PA-C [facility] Pager: [phone] Consults: [phone] Mon-Fri 7:00 am-4:30 pm Sat-Sun 7:00 am-11:30 am

## 2017-02-14 NOTE — Discharge Summary (Signed)
Discharge Summary  Terry Wu:[id number] DOB: [birth year]  PCP: Sinda Du, MD  Admit date: 01/30/2017 Discharge date: 02/14/2017  Time spent: >41mins , more than 50% time spent on coordination of care, case discussed with cir/general surgery and patient's wife.  Recommendations for Outpatient Follow-up after discharge from rehab:  1. F/u with cardiology/neurology/oncology/general surgery.  Discharge Diagnoses:  Active Hospital Problems   Diagnosis Date Noted  . Cerebral embolism with cerebral infarction 02/10/2017  . Colostomy in place [facility])   . Persistent atrial fibrillation ([place])   . Acute respiratory failure ([place])   . Acute respiratory failure with hypoxia ([place])   . S/P abdominal surgery, follow-up exam   . Preoperative cardiovascular examination   . Chronic diastolic heart failure ([place]) 02/01/2017  . Dehydration 02/01/2017  . Acute kidney injury ([place]) 02/01/2017  . Diarrhea 01/30/2017  . Hyponatremia 02/12/2013  . COPD (chronic obstructive pulmonary disease) ([place]) 02/12/2013  . Bilateral leg edema 02/12/2013  . CORONARY ARTERY BYPASS GRAFT, HX OF 07/23/2008  . HYPERTENSION, BENIGN 07/23/2008  . S/P AVR 07/23/2008    Resolved Hospital Problems   Diagnosis Date Noted Date Resolved  No resolved problems to display.    Discharge Condition: stable  Diet recommendation: heart healthy/carb modified  Filed Weights   02/06/17 0620 02/07/17 0614 02/13/17 0555  Weight: 112.9 kg (248 lb 14.4 oz) 113 kg (249 lb 3.2 oz) 114.2 kg (251 lb 11.2 oz)    History of present illness: per admitting MD Dr Luan Pulling at [facility] on 8/8. Primary Care Physician:Hawkins, Percell Miller, MD Admit date: 01/30/2017 Chief Complaint: diarrhea HPI: He came to my office with diarrhea for 2 weeks. He looks dehydrated. He has significant other issues including diabetes coronary disease chronic atrial fib bioprosthetic aortic valve aortic aneurysm coronary disease. He's been on liquid diet  but it has not helped. He's having 12-15 bowel movements daily. He is short of breath when he ambulates now. He's not having a lot of swelling. No chest pain. No nausea. No vomiting. No fever.  GI Dr Laural Golden from [facility] performed colonoscopy on 8/14 with concerning malignant tumor in the distal sigmoid colon. Patient agreed to proceed with surgical resection of the tumor. Cardiology consulted for perioperative clearance on 8/15 at [facility].   Patient is transferred to [facility] on 8/15 for surgery planned on 8/16, patient underwent  Sigmoid colectomy with creation of descending colostomy, Repair of umbilical hernia on 8/11, patient is transferred to ICU post operative due to hypotension requiring pressors and continued the need of mechanical ventilation.   post op course also complicated by acute cva, neurology consulted , he has improved, extubated on 8/17, transferred to hospitalist service on 8/20, cir placement on 8/23  cleared by general surgery   Hospital Course:  Active Problems:   HYPERTENSION, BENIGN   S/P AVR   CORONARY ARTERY BYPASS GRAFT, HX OF   Hyponatremia   COPD (chronic obstructive pulmonary disease) (HCC)   Bilateral leg edema   Diarrhea   Chronic diastolic heart failure (HCC)   Dehydration   Acute kidney injury ([place])   Preoperative cardiovascular examination   Acute respiratory failure (HCC)   Acute respiratory failure with hypoxia (HCC)   S/P abdominal surgery, follow-up exam   Cerebral embolism with cerebral infarction   Colostomy in place 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 - ADENOCARCINOMA EXTENDS INTO BUT NOT DEFINITIVELY THROUGH THE MUSCULARIS PROPRIA. - THERE IS NO EVIDENCE OF CARCINOMA  IN TWELVE OF TWELVE LYMPH NODES (0/12). - THE SURGICAL RESECTION MARGINS ARE NEGATIVE FOR CARCINOMA. Patient is to follow with general surgery and oncology  Large chronically incarcerated umbilical hernia Corrected at time of sigmoid colon cancer surgery  Left acute MCA infarcts 8/17 Neurology suggests initiating aspirin therapy for now, w/ eventual resumption of warfarin - unfortunately the pt reports "swelling of the mouth" when he takes ASA - has persisting word finding difficulty - IV heparin initiated 8/19 w/o bolus -  Neurology consulted, recommended to start on elliquis. Not able to take asa due to allergy Continue follow with neurology outpatient.  Acute hypoxic respiratory failure and hypotension required pressors and mechanical ventilation inpostoperative setting Has now been extubated and is stable from respiratory standpoint - wean O2 as able   Dysphagia Due to CVA - SLP has cleared for diet - he is tolerating dysphagia 3 diet thus far   Acute kidney injury Resolved , with creatinine now normal  Acute on chronic diastolic chf: D/v ivf, improved on  iv lasix, wean oxygen, bilateral  lower extremity pitting edema improving  He is discharged on home oral diruretics, continue monitor volume status and renal function.    Chronic Atrial fibrillation In afib/ rate controlled Chadsvasc is now 7- was on warfarin at time of admission - unable to utilize aspirin at present due to allergy -  He received  heparin gtt during hospitalization, now on apixaban  CAD status post CABG 2006 No c/o chest pain  Aortic valve replacement 2006 - bioprosthetic , continue follow with cardiology  DM2, noninsulin dependent prior to hospitalization CBG currently well-controlled on ssi  Cholelithiasis Incidental findings on Ct scan "Cholelithiasis without definite CT evidence of cholecystitis" lft unremarkable  He denies right upper quadrant pain, no n/v Attempted to have  cholecystectomy during surgery, but not able to reach, he is to follow general surgery.  History of prostate cancer status post radioactive seed placement  DVT prophylaxis:on heparin drip, then transitioned to eliquis Code Status:FULL CODE Family Communication:wife over the phone  Disposition Plan:cir placement  on 8/23  Consultants:  PCCM Gen Surgery Gi Cardiology cir  Antimicrobials: none   Discharge Exam: BP 115/61 (BP Location: Left Arm)   Pulse 86   Temp 97.9 F (36.6 C) (Oral)   Resp 18   Ht 5\' 9"  (1.753 m)   Wt 114.2 kg (251 lb 11.2 oz)   SpO2 97%   BMI 37.17 kg/m    General:  Frail, NAD  Cardiovascular: IRRR  Respiratory: diminished at basis, no wheezing, no rhonchi  Abdomen: post op changes, colostomy bad in place with air and scan fluids, positive BS  Musculoskeletal: bilateral lower extremity pitting Edema, improving  Neuro: alert, oriented but hard of hearing  Discharge Instructions You were cared for by a hospitalist during your hospital stay. If you have any questions about your discharge medications or the care you received while you were in the hospital after you are discharged, you can call the unit and asked to speak with the hospitalist on call if the hospitalist that took care of you is not available. Once you are discharged, your primary care physician will handle any further medical issues. Please note that NO REFILLS for any discharge medications will be authorized once you are discharged, as it  is imperative that you return to your primary care physician (or establish a relationship with a primary care physician if you do not have one) for your aftercare needs so that they can reassess your need for medications and monitor your lab values.  Discharge Instructions    Ambulatory referral to Neurology    Complete by:  As directed    An appointment is requested in approximately: 4 weeks   Diet - low sodium heart healthy    Complete by:   As directed    Carb modified Diet recommendations: Dysphagia 3 (mechanical soft);Thin liquid Liquids provided via: Cup;No straw Medication Administration: Whole meds with puree Supervision: Patient able to self feed;Intermittent supervision to cue for compensatory strategies Compensations: Slow rate;Small sips/bites;Follow solids with liquid Postural Changes and/or Swallow Maneuvers: Seated upright 90 degrees;Upright 30-60 min after meal   Increase activity slowly    Complete by:  As directed      Allergies as of 02/14/2017      Reactions   Aspirin    Unknown, per pt   Demerol    Unknown, per pt      Medication List    STOP taking these medications   amLODipine 10 MG tablet Commonly known as:  NORVASC   potassium chloride SA 20 MEQ tablet Commonly known as:  K-DUR,KLOR-CON   telmisartan 40 MG tablet Commonly known as:  MICARDIS   warfarin 5 MG tablet Commonly known as:  COUMADIN     TAKE these medications   amoxicillin 500 MG capsule Commonly known as:  AMOXIL Take 2,000 mg by mouth as directed.   apixaban 5 MG Tabs tablet Commonly known as:  ELIQUIS Take 1 tablet (5 mg total) by mouth 2 (two) times daily.   atorvastatin 20 MG tablet Commonly known as:  LIPITOR TAKE ONE TABLET BY MOUTH ONCE DAILY   carvedilol 3.125 MG tablet Commonly known as:  COREG Take 1 tablet (3.125 mg total) by mouth 2 (two) times daily with a meal. What changed:  medication strength  See the new instructions.   Co Q 10 100 MG Caps Take 1 capsule by mouth every morning.   dorzolamide-timolol 22.3-6.8 MG/ML ophthalmic solution Commonly known as:  COSOPT Place 1 drop into both eyes 2 (two) times daily.   glimepiride 2 MG tablet Commonly known as:  AMARYL Take 2 mg by mouth daily before breakfast.   ICAPS PO Take 2 tablets by mouth 3 (three) times daily after meals.   magnesium oxide 400 MG tablet Commonly known as:  MAG-OX Take 400 mg by mouth daily.   pantoprazole 40 MG  tablet Commonly known as:  PROTONIX Take 1 tablet (40 mg total) by mouth daily.   polyethylene glycol packet Commonly known as:  MIRALAX / GLYCOLAX Take 17 g by mouth daily.   spironolactone 25 MG tablet Commonly known as:  ALDACTONE TAKE ONE TABLET BY MOUTH ONCE DAILY   torsemide 100 MG tablet Commonly known as:  DEMADEX Take 50 mg by mouth daily.            Discharge Care Instructions        Start     Ordered   02/15/17 0000  polyethylene glycol (MIRALAX / GLYCOLAX) packet  Daily     02/14/17 1414   02/14/17 0000  Ambulatory referral to Neurology    Comments:  An appointment is requested in approximately: 4 weeks   02/14/17 1404   02/14/17 0000  Increase activity slowly  02/14/17 1409   02/14/17 0000  Diet - low sodium heart healthy     02/14/17 1411   02/14/17 0000  apixaban (ELIQUIS) 5 MG TABS tablet  2 times daily     02/14/17 1414   02/14/17 0000  carvedilol (COREG) 3.125 MG tablet  2 times daily with meals     02/14/17 1414     Allergies  Allergen Reactions  . Aspirin     Unknown, per pt  . Demerol     Unknown, per pt   Follow-up [facility] [place]. Go on 02/21/2017.   Specialty:  General Surgery Why:  at 2:30 PM for staple removal. please arrive 30 minutes early. Contact information: [address] [place] [phone]       Donnie Mesa, MD. Call.   Specialty:  General Surgery Why:  as soon as possible to schedule a post-operative follow up appointment with Dr. Georgette Dover in 2-4 weeks. Contact information: [place] [address] [phone]        Satira Sark, MD Follow up.   Specialty:  Cardiology Contact information: [place] [address] [place] Follow up in 3 week(s).   Specialty:  Oncology Why:  colon cancer Contact information: [address] [id number] [place] [phone]           The results of significant diagnostics from this hospitalization (including imaging, microbiology, ancillary and laboratory) are listed below for reference.    Significant Diagnostic Studies: X-ray Chest Pa And Lateral  Result Date: 01/30/2017 CLINICAL DATA:  Shortness of breath, weakness, diarrhea ; history type II diabetes mellitus, essential hypertension, coronary artery disease EXAM: CHEST  2 VIEW COMPARISON:  08/22/2015 FINDINGS: Enlargement of cardiac silhouette post CABG and AVR. Atherosclerotic calcification aorta. Pulmonary vascularity normal. Chronic bronchitic changes with LEFT basilar atelectasis. No gross acute infiltrate, pleural effusion or pneumothorax. Scattered degenerative disc disease changes thoracic spine. IMPRESSION: Enlargement of cardiac silhouette post CABG and AVR. Chronic bronchitic changes with mild LEFT basilar atelectasis. When compared to the previous exam, LEFT basilar aeration appears improved. Aortic Atherosclerosis (ICD10-I70.0). Electronically Signed   By: Lavonia Dana M.D.   On: 01/30/2017 17:21   Ct Head Wo Contrast  Result Date: 02/08/2017 CLINICAL DATA:  Altered mental status EXAM: CT HEAD WITHOUT CONTRAST TECHNIQUE: Contiguous axial images were obtained from the base of the skull through the vertex without intravenous contrast. COMPARISON:  August 22, 2015 FINDINGS: Brain: There is mild diffuse atrophy. There is no intracranial mass, hemorrhage, extra-axial fluid collection, or midline shift. There is evidence of an old prior infarct in the anterior superior right cerebellum. There is small patchy small vessel disease in the centra semiovale bilaterally. Elsewhere gray-white compartments appear normal. No acute appearing infarct evident. There are scattered calcifications without surrounding edema, likely granulomas. Vascular: There is no appreciable hyperdense vessel. There are  foci of calcification in each carotid siphon and distal vertebral artery. There is also calcification in the proximal basilar artery. Skull: Bony calvarium appears intact. Sinuses/Orbits: There is a retention cyst in the posterior left maxillary antrum. There is mucosal thickening in each maxillary antrum. There is opacification and mucosal thickening in several ethmoid air cells bilaterally. Orbits appear symmetric bilaterally. Other: There is opacification of multiple mastoid air cells on the left, stable. Mastoids on the right are clear.  IMPRESSION: Atrophy with mild patchy periventricular small vessel disease. Prior infarct anterior superior right cerebellum. No intracranial mass, hemorrhage, or acute appearing infarct. No subdural or epidural fluid collections. Suspect small calcified granulomas without edema, stable. Areas of paranasal sinus disease. Chronic left-sided mastoid air cell disease. Foci of arterial vascular calcification noted at multiple sites. Electronically Signed   By: Lowella Grip III M.D.   On: 02/08/2017 16:47   [name] Head Wo Contrast  Result Date: 02/10/2017 CLINICAL DATA:  Encephalopathy. Follow-up stroke. History of hypertension, hyperlipidemia, atrial fibrillation. EXAM: MRA HEAD WITHOUT CONTRAST MRA NECK WITHOUT CONTRAST TECHNIQUE: Angiographic images of the Circle of Willis were obtained using MRA technique without intravenous contrast. Angiographic images of the neck were obtained using MRA technique without intravenous contrast. Carotid stenosis measurements (when applicable) are obtained utilizing NASCET criteria, using the distal internal carotid diameter as the denominator. COMPARISON:  MRI of the head February 09, 2017 FINDINGS: MRA HEAD FINDINGS ANTERIOR CIRCULATION: Normal flow related enhancement of the included cervical, petrous, cavernous and supraclinoid internal carotid arteries. Patent anterior communicating artery. Patent anterior and middle cerebral arteries,  including distal segments. No large vessel occlusion, high-grade stenosis, aneurysm. POSTERIOR CIRCULATION: LEFT vertebral artery is dominant. Basilar artery is patent, with normal flow related enhancement of the main branch vessels. Severe stenosis distal RIGHT V4 segment after the RIGHT posterior-inferior cerebellar artery takeoff. Moderate stenosis mid basilar artery. Patent posterior cerebral arteries. Robust LEFT posterior communicating artery present. No large vessel occlusion, high-grade stenosis,  aneurysm. ANATOMIC VARIANTS: None. Source images and MIP images were reviewed. MRA NECK FINDINGS- generalized poor signal ANTERIOR CIRCULATION: Due to time-of-flight technique arch vessel origins not characterize. Common carotid artery's of patent bilaterally. Potentially hemodynamically significant RIGHT internal carotid artery stenosis, not quantifiable. LEFT internal carotid artery appears widely patent. POSTERIOR CIRCULATION: Bilateral vertebral arteries are patent to the vertebrobasilar junction. No definite flow limiting stenosis or luminal irregularity. Source images and MIP images were reviewed. IMPRESSION: MRI head: 1. No emergent large vessel occlusion. 2. No significant stenosis anterior circulation. Severe stenosis RIGHT V4 segment and moderate stenosis basilar artery. MRA NECK: 1. Technically limited. Suspected potentially hemodynamically significant stenosis RIGHT internal carotid artery origin. Electronically Signed   By: Elon Alas M.D.   On: 02/10/2017 05:03   [name] Neck Wo Contrast  Result Date: 02/10/2017 CLINICAL DATA:  Encephalopathy. Follow-up stroke. History of hypertension, hyperlipidemia, atrial fibrillation. EXAM: MRA HEAD WITHOUT CONTRAST MRA NECK WITHOUT CONTRAST TECHNIQUE: Angiographic images of the Circle of Willis were obtained using MRA technique without intravenous contrast. Angiographic images of the neck were obtained using MRA technique without intravenous contrast.  Carotid stenosis measurements (when applicable) are obtained utilizing NASCET criteria, using the distal internal carotid diameter as the denominator. COMPARISON:  MRI of the head February 09, 2017 FINDINGS: MRA HEAD FINDINGS ANTERIOR CIRCULATION: Normal flow related enhancement of the included cervical, petrous, cavernous and supraclinoid internal carotid arteries. Patent anterior communicating artery. Patent anterior and middle cerebral arteries, including distal segments. No large vessel occlusion, high-grade stenosis, aneurysm. POSTERIOR CIRCULATION: LEFT vertebral artery is dominant. Basilar artery is patent, with normal flow related enhancement of the main branch vessels. Severe stenosis distal RIGHT V4 segment after the RIGHT posterior-inferior cerebellar artery takeoff. Moderate stenosis mid basilar artery. Patent posterior cerebral arteries. Robust LEFT posterior communicating artery present. No large vessel occlusion, high-grade stenosis,  aneurysm. ANATOMIC VARIANTS: None. Source images and MIP images were reviewed. MRA NECK FINDINGS- generalized poor signal ANTERIOR CIRCULATION: Due to time-of-flight technique arch  vessel origins not characterize. Common carotid artery's of patent bilaterally. Potentially hemodynamically significant RIGHT internal carotid artery stenosis, not quantifiable. LEFT internal carotid artery appears widely patent. POSTERIOR CIRCULATION: Bilateral vertebral arteries are patent to the vertebrobasilar junction. No definite flow limiting stenosis or luminal irregularity. Source images and MIP images were reviewed. IMPRESSION: MRI head: 1. No emergent large vessel occlusion. 2. No significant stenosis anterior circulation. Severe stenosis RIGHT V4 segment and moderate stenosis basilar artery. MRA NECK: 1. Technically limited. Suspected potentially hemodynamically significant stenosis RIGHT internal carotid artery origin. Electronically Signed   By: Elon Alas M.D.   On:  02/10/2017 05:03   Mr Brain Wo Contrast  Result Date: 02/09/2017 CLINICAL DATA:  Encephalopathy. 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 History of hypertension, atrial fibrillation and diabetes EXAM: MRI HEAD WITHOUT CONTRAST TECHNIQUE: Multiplanar, multiecho pulse sequences of the brain and surrounding structures were obtained without intravenous contrast. COMPARISON:  CT HEAD February 08, 2017 FINDINGS: BRAIN: Patchy reduced diffusion LEFT frontal and parietal lobes, sparing of the insula ; Corresponding low ADC values. Ventricles and sulci are overall normal for patient's age. Old RIGHT cerebellar infarcts. Scattered susceptibility artifact in the cerebellum predominately corresponding to old infarcts. Scattered subcentimeter supratentorial white matter FLAIR T2 hyperintensities exclusive of the aforementioned abnormality compatible with mild chronic small vessel ischemic disease, less than expected for age. Ventricles and sulci are normal for patient's age. No abnormal parenchymal signal, mass effect or masses. 7 mm low T2 signal mass LEFT planum sphenoidale. No abnormal extra-axial fluid collections. VASCULAR: Normal major intracranial vascular flow voids present at skull base. SKULL AND UPPER CERVICAL SPINE: No abnormal sellar expansion. No suspicious calvarial bone marrow signal. Craniocervical junction maintained. SINUSES/ORBITS: The mastoid air-cells and included paranasal sinuses are well-aerated. The included ocular globes and orbital contents are non-suspicious. OTHER: None. IMPRESSION: 1. Patchy acute small LEFT MCA territory infarcts. 2. Multiple old cerebellar infarcts. Mild chronic small vessel ischemic disease. 3. 8 mm LEFT planum sphenoidale meningioma. Electronically Signed   By: Elon Alas M.D.   On: 02/09/2017 01:25   Ct Abdomen Pelvis W Contrast  Result Date: 01/30/2017 CLINICAL DATA:  Diarrhea for 3 weeks, abdominal swelling, weakness, shortness of  breath EXAM: CT ABDOMEN AND PELVIS WITH CONTRAST TECHNIQUE: Multidetector CT imaging of the abdomen and pelvis was performed using the standard protocol following bolus administration of intravenous contrast. Sagittal and coronal images were reconstructed from the axial data set. CONTRAST:  75 cc Isovue 300 IV. Dilute oral contrast. COMPARISON:  12/04/2010 FINDINGS: Lower chest: Mild LEFT basilar atelectasis. Hepatobiliary: Multiple calcified gallstones in gallbladder largest 2.3 cm diameter. No definite gallbladder wall thickening. Liver unremarkable. No biliary dilatation. Pancreas: Normal appearance Spleen: Normal appearance.  Tiny splenules anterior to spleen. Adrenals/Urinary Tract: Small nonobstructing calculus inferior pole LEFT kidney. Adrenal glands, kidneys, ureters and bladder otherwise unremarkable. Stomach/Bowel: Increased stool throughout colon. Surgical clips at gastroesophageal junction question prior anti reflux procedure. Duodenal diverticulum noted. Remaining bowel loops and stomach unremarkable. Vascular/Lymphatic: Extensive atherosclerotic calcifications extensive atherosclerotic disease aorta and coronary arteries. Aorta normal caliber. Extensive calcified atherosclerotic plaque at the origins of the celiac artery, SMA and renal arteries. No adenopathy. Reproductive: Brachytherapy seed implants within prostate bed Other: BILATERAL inguinal hernias containing fat. Moderate-sized hernia containing fat. No free intraperitoneal air or fluid. No definite inflammatory process identified. Musculoskeletal: Scattered degenerative disc and facet disease changes of the thoracolumbar spine. No acute osseous findings. IMPRESSION: Umbilical and BILATERAL inguinal hernias containing fat. Increased stool in colon.  Small nonobstructing LEFT renal calculus. Cholelithiasis without definite CT evidence of cholecystitis though if this is a clinical concern recommend ultrasound follow-up. Extensive calcified  atherosclerotic plaques of the abdominal aorta including the origins of the abdominal visceral vessels. Coronary artery calcifications. No acute intra-abdominal or intrapelvic process identified. Electronically Signed   By: Lavonia Dana M.D.   On: 01/30/2017 17:47   Portable Chest Xray  Result Date: 02/08/2017 CLINICAL DATA:  Respiratory failure. EXAM: PORTABLE CHEST 1 VIEW COMPARISON:  02/07/2017. FINDINGS: Endotracheal tube, NG tube in stable position. Prior cardiac valve replacement. Stable cardiomegaly. Mild bibasilar atelectasis again noted. No pleural effusion or pneumothorax. IMPRESSION: 1. Lines and tubes in stable position. 2.  Prior cardiac valve replacement.  Stable cardiomegaly. 3.  Mild bibasilar atelectasis again noted. Electronically Signed   By: Marcello Moores  Register   On: 02/08/2017 06:38   Dg Chest Port 1 View  Result Date: 02/07/2017 CLINICAL DATA:  83 year old male with history of acute respiratory failure. EXAM: PORTABLE CHEST 1 VIEW COMPARISON:  Chest x-ray 01/30/2017. FINDINGS: An endotracheal tube is in place with tip 3.4 cm above the carina. A nasogastric tube is seen extending into the stomach, however, the tip of the nasogastric tube extends below the lower margin of the image. Lung volumes are low. Scarring and/or subsegmental atelectasis in the left lung base with probable small left pleural effusion. Right lung is clear. No right pleural effusion. No pneumothorax. Pulmonary vasculature appears engorged, without frank pulmonary edema. Mild enlargement of the cardiopericardial silhouette. The patient is rotated to the right on today's exam, resulting in distortion of the mediastinal contours and reduced diagnostic sensitivity and specificity for mediastinal pathology. Atherosclerosis in the thoracic aorta. Status post median sternotomy for CABG and aortic valve replacement. Surgical clips near the gastroesophageal junction. IMPRESSION: 1. Support apparatus, as above. 2. Low lung volumes  with left basilar atelectasis or scarring and small left pleural effusion, unchanged. 3. Cardiomegaly with pulmonary venous congestion, but no frank pulmonary edema. 4. Aortic atherosclerosis. Electronically Signed   By: Vinnie Langton M.D.   On: 02/07/2017 19:20   Dg Abd 2 Views  Result Date: 02/08/2017 CLINICAL DATA:  Radiopaque structure in area of left lower quadrant ostomy EXAM: ABDOMEN - 2 VIEW COMPARISON:  CT abdomen and pelvis January 30, 2017; abdominal radiograph February 08, 2017 FINDINGS: Supine and upright images are obtained. There are 2 adjacent linear radiopaque foreign bodies in the left lower quadrant which appear to represent small clips. Clips are also noted at the gastroesophageal junction. There are skin staples in the lower pelvis. There are apparent seed implants in the prostate. There is no appreciable bowel dilatation or air-fluid level to suggest bowel obstruction. No evident free air. There is a change in left base. There is aortic atherosclerotic calcification. IMPRESSION: The linear radiopaque foreign bodies in the left lower quadrant appear represent small clips. There also clips at the gastroesophageal junction. There is atherosclerotic calcification in the aorta. There are apparent seed implants in the prostate. No bowel dilatation or air-fluid level to suggest bowel obstruction. No free air. Aortic Atherosclerosis (ICD10-I70.0). Electronically Signed   By: Lowella Grip III M.D.   On: 02/08/2017 18:14   Dg Abd Portable 1v  Result Date: 02/08/2017 CLINICAL DATA:  Status post umbilical hernia repair surgery. Follow-up exam. EXAM: PORTABLE ABDOMEN - 1 VIEW COMPARISON:  01/30/2017 FINDINGS: There are mildly dilated loops of small and large bowel identified within the abdomen. These measure up to scratch set the small bowel loops  measure up to 3 mm. Skin staples are noted along the midline of the pelvis. Within the left lower a quadrant of the abdomen there is a linear  radiodensity which measures approximately 1 cm and is of on certain clinical significance. IMPRESSION: 1. Imaging findings suggestive of postoperative ileus. No evidence for high-grade bowel obstruction. 2. Left lower quadrant linear radiodensity is of uncertain clinical significance. Small foreign body cannot be excluded. Recommend followup imaging with acute abdominal series including upright images. These results will be called to the ordering clinician or representative by the Radiologist Assistant, and communication documented in the PACS or zVision Dashboard. Electronically Signed   By: Kerby Moors M.D.   On: 02/08/2017 16:06    Microbiology: Recent Results (from the past 240 hour(s))  MRSA PCR Screening     Status: None   Collection Time: 02/07/17  5:53 PM  Result Value Ref Range Status   MRSA by PCR NEGATIVE NEGATIVE Final    Comment:        The GeneXpert MRSA Assay (FDA approved for NASAL specimens only), is one component of a comprehensive MRSA colonization surveillance program. It is not intended to diagnose MRSA infection nor to guide or monitor treatment for MRSA infections.      Labs: Basic Metabolic Panel:  Recent Labs Lab 02/07/17 2012  02/09/17 0430 02/10/17 0450 02/11/17 0211 02/12/17 0307 02/14/17 0401  NA 134*  < > 138 139 139 138 136  K 4.7  < > 4.3 4.7 4.1 4.3 4.1  CL 99*  < > 104 108 107 106 100*  CO2 27  < > 27 20* 27 29 32  GLUCOSE 140*  < > 138* 99 141* 166* 175*  BUN 15  < > 19 13 12 11 6   CREATININE 1.19  < > 0.94 0.85 0.78 0.76 0.69  CALCIUM 8.4*  < > 8.5* 8.5* 8.3* 8.3* 8.2*  MG 1.8  --   --   --   --   --  1.9  PHOS 3.3  --   --   --   --   --   --   < > = values in this interval not displayed. Liver Function Tests:  Recent Labs Lab 02/07/17 2012 02/08/17 0322 02/11/17 0211 02/12/17 0307  AST 20 18 13* 13*  ALT 24 23 17 17   ALKPHOS 29* 31* 24* 25*  BILITOT 1.0 1.2 1.0 0.8  PROT 5.8* 6.0* 5.4* 5.3*  ALBUMIN 2.8* 2.6* 2.4* 2.3*    No results for input(s): LIPASE, AMYLASE in the last 168 hours. No results for input(s): AMMONIA in the last 168 hours. CBC:  Recent Labs Lab 02/07/17 2012  02/09/17 0430 02/11/17 0211 02/12/17 0307 02/13/17 0430 02/14/17 0401  WBC 14.0*  < > 10.7* 7.9 7.0 8.4 9.3  NEUTROABS 12.6*  --   --   --   --   --   --   HGB 9.7*  < > 8.5* 8.7* 8.5* 9.1* 8.7*  HCT 29.8*  < > 25.9* 27.2* 26.4* 28.6* 27.4*  MCV 96.8  < > 93.8 95.4 95.7 97.9 95.5  PLT 170  < > 159 161 168 181 201  < > = values in this interval not displayed. Cardiac Enzymes:  Recent Labs Lab 02/07/17 2012 02/08/17 0322  TROPONINI 0.07* 0.06*   BNP: BNP (last 3 results) No results for input(s): BNP in the last 8760 hours.  ProBNP (last 3 results) No results for input(s): PROBNP in the last 8760  hours.  CBG:  Recent Labs Lab 02/13/17 1203 02/13/17 1702 02/13/17 2221 02/14/17 0752 02/14/17 1223  GLUCAP 156* 163* 179* 149* 189*       Signed:  Peterson Mathey MD, PhD  [facility] 02/14/2017, 2:45 PM

## 2017-02-14 NOTE — Progress Notes (Signed)
1545 02/14/17 nursing To rehab alert and oriented patient accompanied by NT and family. Noted rectal pouch used as condom cath for incontinence. Colostomy intact. Surgical  iincision intact with staples noted. Dressing changed per order. Oriented to unit setup.

## 2017-02-14 NOTE — Progress Notes (Signed)
Cristina Gong, RN Rehab Admission Coordinator Signed Physical Medicine and Rehabilitation  PMR Pre-admission Date of Service: 02/12/2017 3:04 PM  Related encounter: Admission (Current) from 01/30/2017 in [place]       _0 Hide copied text PMR Admission Coordinator Pre-Admission Assessment  Patient: Terry Wu is an 83 y.o., male MRN: [id number] DOB: [birth year] Height: _1  (175.3 cm) Weight: 114.2 kg (251 lb 11.2 oz)                                                                                                                                                  Insurance Information HMO:     PPO:      PCP:      IPA:      80/20: yes     OTHER: no HMO PRIMARY: Medicare a and b      Policy#: [id number]      Subscriber: pt Benefits:  Phone #: passport one online     Name: 02/11/17 Eff. Date: 12/24/1998     Deduct: $1340      Out of Pocket Max: none      Life Max: none CIR: 100%      SNF: 20 full days Outpatient: 80%     Co-Pay: 20% Home Health: 100%      Co-Pay: none DME: 80%     Co-Pay: 20% Providers: pt choice  SECONDARY: AARP supplement      Policy#: [id number]      Subscriber: pt  Medicaid Application Date:       Case Manager:  Disability Application Date:       Case Worker:   Emergency Contact Information        Contact Information    Name Relation Home Work Mobile   [place] Spouse [phone]  [phone]   Quran, Vasco   [phone]     Current Medical History  Patient Admitting Diagnosis: 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  Presented 01/30/2017 with diarrhea 2 weeks and  increasing shortness of breath with ambulation. He denied any abdominal pain or rectal bleeding. CT of abdomen and pelvis showed no acute intra-abdominal or intrapelvic process identified. Clostridium difficile specimen negative. Underwent colonoscopy 02/05/2017 per gastroenterology that showed proximal sigmoid colon, mid sigmoid colon and descending colon to be normal. There was a malignant tumor in the distal sigmoid colon that was biopsied. Later underwent sigmoid colectomy with creation of descending colostomy after new diagnosis of adenocarcinoma per biopsy 02/07/2017 per general surgery. Patient was extubated 02/09/2007. MRI of the brain due to postoperative confusionand word finding difficultyshowed  patchy acute small left MCA territory infarcts. 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 MRA with no emergent large vessel occlusion. Carotid Dopplers had no ICA stenosis. His chronic Coumadin currently is on hold and was transitioned to Eliquisper follow-up of cardiology services.Acute blood loss anemia monitored.Tolerating a mechanical soft diet.   Total: 0 NIHSS  Past Medical History      Past Medical History:  Diagnosis Date  . Aortic aneurysm (HCC)    Ascending thoracic - 4.8 cm by CT 2017  . Aortic regurgitation    Bioprosthetic AVR 2006  . Atrial fibrillation with controlled ventricular response ([place]) 08/22/2015   CHADS2VASC=5, on Coumadin  . CAD (coronary artery disease)    Multivessel status post CABG 2006  . Complication of anesthesia   . Esophageal reflux   . Essential hypertension   . History of dizziness   . Mixed hyperlipidemia   . Type 2 diabetes mellitus (HCC)     Family History  family history includes Cancer in his unknown relative; Heart Problems in his mother; Stroke in his father.  Prior Rehab/Hospitalizations:  Has the patient had major surgery during 100 days prior to admission? No  Current Medications   Current Facility-Administered  Medications:  .  apixaban (ELIQUIS) tablet 5 mg, 5 mg, Oral, BID, Cherene Altes, MD, 5 mg at 02/14/17 0948 .  atorvastatin (LIPITOR) tablet 20 mg, 20 mg, Oral, q1800, Rosalin Hawking, MD, 20 mg at 02/13/17 1804 .  carvedilol (COREG) tablet 3.125 mg, 3.125 mg, Oral, BID WC, Cherene Altes, MD, 3.125 mg at 02/14/17 0948 .  chlorhexidine gluconate (MEDLINE KIT) (PERIDEX) 0.12 % solution 15 mL, 15 mL, Mouth Rinse, BID, Javier Glazier, MD, 15 mL at 02/14/17 0949 .  dorzolamide-timolol (COSOPT) 22.3-6.8 MG/ML ophthalmic solution 1 drop, 1 drop, Both Eyes, BID, Sinda Du, MD, 1 drop at 02/14/17 0944 .  fentaNYL (SUBLIMAZE) injection 25-50 mcg, 25-50 mcg, Intravenous, Q2H PRN, Cherene Altes, MD .  furosemide (LASIX) injection 40 mg, 40 mg, Intravenous, Daily, Allie Bossier, MD, 40 mg at 02/14/17 0948 .  insulin aspart (novoLOG) injection 0-5 Units, 0-5 Units, Subcutaneous, QHS, McClung, Jeffrey T, MD .  insulin aspart (novoLOG) injection 0-9 Units, 0-9 Units, Subcutaneous, TID WC, Cherene Altes, MD, 1 Units at 02/14/17 [phone] .  levalbuterol (XOPENEX) nebulizer solution 0.63 mg, 0.63 mg, Nebulization, Q3H PRN, Cherene Altes, MD .  MEDLINE mouth rinse, 15 mL, Mouth Rinse, QID, Javier Glazier, MD, 15 mL at 02/12/17 2251 .  pantoprazole (PROTONIX) EC tablet 40 mg, 40 mg, Oral, Q1200, Cherene Altes, MD, 40 mg at 02/13/17 1236 .  polyethylene glycol (MIRALAX / GLYCOLAX) packet 17 g, 17 g, Oral, Daily, Simaan, Elizabeth S, PA-C, 17 g at 02/14/17 2035  Patients Current Diet: DIET DYS 3 Room service appropriate? Yes; Fluid consistency: Thin  Precautions / Restrictions Precautions Precautions: Fall Restrictions Weight Bearing Restrictions: No   Has the patient had 2 or more falls or a fall with injury in the past year?No  Prior Activity Level Community (5-7x/wk): not driving for 6 months due to glaucoma, poor peripheral vision  Development worker, international aid /  Equipment Home Assistive Devices/Equipment: None Home Equipment: Walker - standard  Prior Device Use: Indicate devices/aids used by the patient prior to current illness, exacerbation or injury? None of the above  Prior Functional Level Prior Function Level of Independence: Independent Comments: no driving for 6 months due to vision  Self Care: Did the patient need help bathing, dressing, using  the toilet or eating?  Independent  Indoor Mobility: Did the patient need assistance with walking from room to room (with or without device)? Independent  Stairs: Did the patient need assistance with internal or external stairs (with or without device)? Independent  Functional Cognition: Did the patient need help planning regular tasks such as shopping or remembering to take medications? Independent  Current Functional Level Cognition  Arousal/Alertness: Awake/alert Overall Cognitive Status: Impaired/Different from baseline Current Attention Level: Selective Orientation Level: Oriented X4 Following Commands: Follows multi-step commands with increased time General Comments: multimodal cues for safety during transfers  Attention: Sustained Sustained Attention: Appears intact    Extremity Assessment (includes Sensation/Coordination)  Upper Extremity Assessment: Defer to OT evaluation LUE Deficits / Details: Grossly 4/5 strength   Lower Extremity Assessment: Generalized weakness    ADLs  Overall ADL's : Needs assistance/impaired Eating/Feeding: Set up, Sitting Grooming: Wash/dry face, Wash/dry hands, Set up, Sitting Upper Body Bathing: Minimal assistance, Sitting Lower Body Bathing: Maximal assistance, Sit to/from stand Upper Body Dressing : Moderate assistance, Sitting Lower Body Dressing: Total assistance, Sit to/from stand Toilet Transfer: Minimal assistance, +2 for physical assistance, +2 for safety/equipment, Stand-pivot, BSC, RW Toileting- Clothing Manipulation and  Hygiene: Total assistance Toileting - Clothing Manipulation Details (indicate cue type and reason): foley and ostomy Functional mobility during ADLs: Minimal assistance, +2 for safety/equipment, Rolling walker, +2 for physical assistance General ADL Comments: Pt completed stand pivot to recliner, initially ModA +2 for sit>stand at RW, MinA to pivot to recliner, ModA+1 for additional sit>stand at RW, completed seated grooming ADLs in recliner    Mobility  Overal bed mobility: Needs Assistance Bed Mobility: Supine to Sit, Sit to Supine Supine to sit: Mod assist, +2 for physical assistance, HOB elevated Sit to supine: Mod assist, +2 for physical assistance General bed mobility comments: increased time and effort, multimodal cueing for sequencing, use of bed rails, assist to elevate trunk to achieve sitting EOB, and assist to lift LEs back into bed    Transfers  Overall transfer level: Needs assistance Equipment used: Rolling walker (2 wheeled) Transfers: Sit to/from Stand Sit to Stand: Min assist, +2 safety/equipment Stand pivot transfers: Min assist General transfer comment: increased time, cueing for technique and hand placement, assist for stability with transition into standing from sitting EOB    Ambulation / Gait / Stairs / Wheelchair Mobility  Ambulation/Gait Ambulation/Gait assistance: Physicist, medical (Feet): 20 Feet Assistive device: Rolling walker (2 wheeled) Gait Pattern/deviations: Step-through pattern, Decreased step length - right, Decreased step length - left, Trunk flexed General Gait Details: modest instability and fatiguing quickly, min guard for safety with use of RW Gait velocity: decreased Gait velocity interpretation: <1.8 ft/sec, indicative of risk for recurrent falls    Posture / Balance Balance Overall balance assessment: Needs assistance, History of Falls Sitting-balance support: Feet supported Sitting balance-Leahy Scale: Fair Standing  balance support: Bilateral upper extremity supported Standing balance-Leahy Scale: Poor Standing balance comment: Pt reliant on bil UE support and min guard     Special needs/care consideration BiPAP/CPAP  N/a CPM  N/a Continuous Drip IV n/a Dialysis  N/a Life Vest  N/a Oxygen  N/a Special Bed  N/a Trach Size  N/a Wound Vac n/a Skin surgical incision with staples and ecchymosis; abrasion to buttocks right and left; ecchymosis to back right and left; moisture associated area to skin of buttocks right and left; ostomy. Bowel mgmt: [place] nurse ostomy follow up with Julien Girt [phone] Bladder mgmt: incontinent ; condom catheter Diabetic  mgmt yes    Previous Home Environment Living Arrangements: Spouse/significant other, Children (adult daughter lives with parents)  Lives With: Spouse, Daughter Available Help at Discharge: Family, Available 24 hours/day Type of Home: House Home Layout: Multi-level Alternate Level Stairs-Rails: Right Alternate Level Stairs-Number of Steps: 7 Home Access: Level entry Bathroom Shower/Tub: Multimedia programmer: Standard Bathroom Accessibility: Yes How Accessible: Accessible via walker [place]: No  Discharge Living Setting Plans for Discharge Living Setting: Patient's home, Lives with (comment) (spouse and adult daughter) Type of Home at Discharge: House Discharge Home Layout: Multi-level Alternate Level Stairs-Rails: Right Alternate Level Stairs-Number of Steps: 7 Discharge Home Access: Level entry Discharge Bathroom Shower/Tub: Walk-in shower Discharge Bathroom Toilet: Standard Discharge Bathroom Accessibility: Yes How Accessible: Accessible via walker Does the patient have any problems obtaining your medications?: No  Social/Family/Support Systems Patient Roles: Spouse, Parent Contact Information: Rosendo Gros, wife Anticipated Caregiver: wife and daughter Anticipated Ambulance person Information: see  above Ability/Limitations of Caregiver: wife 54 years old; daughter has vision issues and unemployed due to vision Caregiver Availability: 24/7 Discharge Plan Discussed with Primary Caregiver: Yes Is Caregiver In Agreement with Plan?: Yes Does Caregiver/Family have Issues with Lodging/Transportation while Pt is in Rehab?: Yes  Goals/Additional Needs Patient/Family Goal for Rehab: Mod I to superivison with PT, OT, and SLP Expected length of stay: [facility] 10- 15 days Special Service Needs: new diagnosis of adenocarcinoma Pt/Family Agrees to Admission and willing to participate: Yes Program Orientation Provided & Reviewed with Pt/Caregiver Including Roles  & Responsibilities: Yes  Barriers to Discharge: Decreased caregiver support (new colostomy)  Decrease burden of Care through IP rehab admission: n/a  Possible need for SNF placement upon discharge:not anticipated, but if pt does not reach supervision level, they may would consider.  Patient Condition: This patient's medical and functional status has changed since the consult dated: 02/11/2017 in which the Rehabilitation Physician determined and documented that the patient's condition is appropriate for intensive rehabilitative care in an inpatient rehabilitation facility. See "History of Present Illness" (above) for medical update. Functional changes are: overall mod assist. Patient's medical and functional status update has been discussed with the Rehabilitation physician and patient remains appropriate for inpatient rehabilitation. Will admit to inpatient rehab today.  Preadmission Screen Completed By:  Cleatrice Burke, 02/14/2017 12:13 PM ______________________________________________________________________   Discussed status with Dr. Letta Pate on 02/14/2017 at 1213 and received telephone approval for admission today.  Admission Coordinator:  Cleatrice Burke, time 1610 Date 02/14/2017       Cosigned by: Charlett Blake, MD at 02/14/2017 12:41 PM  Revision History

## 2017-02-14 NOTE — Progress Notes (Signed)
Discussed with Dr. Erlinda Hong, pta at bedside and wife by phone. All are in agreement to admit to CIR today. I will make the arraignments. [phone]

## 2017-02-14 NOTE — Discharge Instructions (Signed)
[facility]      [facility] °[phone] ° °OPEN ABDOMINAL SURGERY: POST OP INSTRUCTIONS ° °Always review your discharge instruction sheet given to you by the facility where your surgery was performed. ° °IF YOU HAVE DISABILITY OR FAMILY LEAVE FORMS, YOU MUST BRING THEM TO THE OFFICE FOR PROCESSING.  PLEASE DO NOT GIVE THEM TO YOUR DOCTOR. ° °1. A prescription for pain medication may be given to you upon discharge.  Take your pain medication as prescribed, if needed.  If narcotic pain medicine is not needed, then you may take acetaminophen (Tylenol) or ibuprofen (Advil) as needed. °2. Take your usually prescribed medications unless otherwise directed. °3. If you need a refill on your pain medication, please contact your pharmacy. They will contact our office to request authorization.  Prescriptions will not be filled after 5pm or on week-ends. °4. You should follow a light diet the first few days after arrival home, such as soup and crackers, pudding, etc.unless your doctor has advised otherwise. A high-fiber, low fat diet can be resumed as tolerated.   Be sure to include lots of fluids daily. Most patients will experience some swelling and bruising on the chest and neck area.  Ice packs will help.  Swelling and bruising can take several days to resolve °5. Most patients will experience some swelling and bruising in the area of the incision. Ice pack will help. Swelling and bruising can take several days to resolve..  °6. It is common to experience some constipation if taking pain medication after surgery.  Increasing fluid intake and taking a stool softener will usually help or prevent this problem from occurring.  A mild laxative (Milk of Magnesia or Miralax) should be taken according to package directions if there are no bowel movements after 48 hours. °7.  You may have steri-strips (small skin tapes) in place directly over the incision.  These strips should be left on the skin for 7-10 days.  If your  surgeon used skin glue on the incision, you may shower in 24 hours.  The glue will flake off over the next 2-3 weeks.  Any sutures or staples will be removed at the office during your follow-up visit. You may find that a light gauze bandage over your incision may keep your staples from being rubbed or pulled. You may shower and replace the bandage daily. °8. ACTIVITIES:  You may resume regular (light) daily activities beginning the next day--such as daily self-care, walking, climbing stairs--gradually increasing activities as tolerated.  You may have sexual intercourse when it is comfortable.  Refrain from any heavy lifting or straining until approved by your doctor. °a. You may drive when you no longer are taking prescription pain medication, you can comfortably wear a seatbelt, and you can safely maneuver your car and apply brakes °b. Return to Work: ___________________________________ °9. You should see your doctor in the office for a follow-up appointment approximately two weeks after your surgery.  Make sure that you call for this appointment within a day or two after you arrive home to insure a convenient appointment time. °OTHER INSTRUCTIONS:  °_____________________________________________________________ °_____________________________________________________________ ° °WHEN TO CALL YOUR DOCTOR: °1. Fever over 101.0 °2. Inability to urinate °3. Nausea and/or vomiting °4. Extreme swelling or bruising °5. Continued bleeding from incision. °6. Increased pain, redness, or drainage from the incision. °7. Difficulty swallowing or breathing °8. Muscle cramping or spasms. °9. Numbness or tingling in hands or feet or around lips. ° °The clinic staff is available to   answer your questions during regular business hours.  Please dont hesitate to call and ask to speak to one of the nurses if you have concerns.  For further questions, please visit  [email or web address]  ______________________________________________  Information on my medicine - ELIQUIS (apixaban)  This medication education was reviewed with me or my healthcare representative as part of my discharge preparation.   Why was Eliquis prescribed for you? Eliquis was prescribed for you to reduce the risk of a blood clot forming that can cause a stroke if you have a medical condition called atrial fibrillation (a type of irregular heartbeat).  What do You need to know about Eliquis ? Take your Eliquis TWICE DAILY - one tablet in the morning and one tablet in the evening with or without food. If you have difficulty swallowing the tablet whole please discuss with your pharmacist how to take the medication safely.  Take Eliquis exactly as prescribed by your doctor and DO NOT stop taking Eliquis without talking to the doctor who prescribed the medication.  Stopping may increase your risk of developing a stroke.  Refill your prescription before you run out.  After discharge, you should have regular check-up appointments with your healthcare provider that is prescribing your Eliquis.  In the future your dose may need to be changed if your kidney function or weight changes by a significant amount or as you get older.  What do you do if you miss a dose? If you miss a dose, take it as soon as you remember on the same day and resume taking twice daily.  Do not take more than one dose of ELIQUIS at the same time to make up a missed dose.  Important Safety Information A possible side effect of Eliquis is bleeding. You should call your healthcare provider right away if you experience any of the following: ? Bleeding from an injury or your nose that does not stop. ? Unusual colored urine (red or dark brown) or unusual colored stools (red or black). ? Unusual bruising for unknown reasons. ? A serious fall or if you hit your head (even if there is no bleeding).  Some  medicines may interact with Eliquis and might increase your risk of bleeding or clotting while on Eliquis. To help avoid this, consult your healthcare provider or pharmacist prior to using any new prescription or non-prescription medications, including herbals, vitamins, non-steroidal anti-inflammatory drugs (NSAIDs) and supplements.  This website has more information on Eliquis (apixaban): [email or web address]

## 2017-02-14 NOTE — H&P (Signed)
Physical Medicine and Rehabilitation Admission H&P     Chief complaint: Weakness   HPI: Terry Wu is a 83 y.o. right handed male with history of aortic aneurysm, atrial fibrillation, CAD with CABG/AVR 2006  maintained on chronic Coumadin, hypertension , type 2 diabetes mellitus and history of prostate cancer status post radioactive seed implant. Per chart review patient lives with spouse. Independent prior to admission and still driving up until recently. Presented 01/30/2017 with diarrhea 2 weeks and increasing shortness of breath with ambulation. He denied any abdominal pain or rectal bleeding. CT of abdomen and pelvis showed no acute intra-abdominal or intrapelvic process identified. Clostridium difficile specimen negative. Underwent colonoscopy 02/05/2017 per gastroenterology that showed proximal sigmoid colon, mid sigmoid colon and descending colon to be normal. There was a malignant tumor in the distal sigmoid colon that was biopsied. Later underwent sigmoid colectomy with creation of descending colostomy after new diagnosis of adenocarcinoma per biopsy 02/07/2017 per general surgery. Patient was extubated 02/09/2007. MRI of the brain due to postoperative confusion and word finding difficulty showed patchy acute small left MCA territory infarcts. 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 MRA with no emergent large vessel occlusion. Carotid Dopplers had no ICA stenosis. His chronic Coumadin currently is on hold and was transitioned to Eliquis per follow-up of cardiology services. Acute blood loss anemia 9.1 and monitored. Tolerating a mechanical soft diet. Physical  and Occupational therapy evaluation completed with recommendations of physical medicine rehabilitation consult. Patient was admitted for a comprehensive rehabilitation program   Review of Systems  Constitutional: Negative for chills and fever.  HENT: Negative for hearing loss.   Eyes: Negative for blurred vision and double vision.   Respiratory: Positive for shortness of breath. Negative for cough.   Cardiovascular: Positive for palpitations and leg swelling. Negative for chest pain.  Gastrointestinal: Positive for abdominal pain, diarrhea and nausea.  Genitourinary: Negative for dysuria and hematuria.  Musculoskeletal: Positive for joint pain.  Skin: Negative for rash.  Neurological: Positive for dizziness and weakness.  All other systems reviewed and are negative.       Past Medical History:  Diagnosis Date  . Aortic aneurysm (HCC)      Ascending thoracic - 4.8 cm by CT 2017  . Aortic regurgitation      Bioprosthetic AVR 2006  . Atrial fibrillation with controlled ventricular response ([place]) 08/22/2015    CHADS2VASC=5, on Coumadin  . CAD (coronary artery disease)      Multivessel status post CABG 2006  . Complication of anesthesia    . Esophageal reflux    . Essential hypertension    . History of dizziness    . Mixed hyperlipidemia    . Type 2 diabetes mellitus ([place])           Past Surgical History:  Procedure Laterality Date  . AORTIC VALVE REPLACEMENT   2006    [facility] Pericardial tissue valve  . COLON RESECTION SIGMOID N/A 02/07/2017    Procedure: SIGMOID COLECTOMY WITH COLOSTOMY;  Surgeon: Donnie Mesa, MD;  Location: [facility];  Service: General;  Laterality: N/A;  . COLONOSCOPY N/A 02/05/2017    Procedure: COLONOSCOPY;  Surgeon: Rogene Houston, MD;  [facility];  Service: Endoscopy;  Laterality: N/A;  . CORONARY ARTERY BYPASS GRAFT   2006    LIMA to LAD, SVG to diagonal, SVG to OM, SVG to PDA   . HERNIA REPAIR      . TOTAL KNEE ARTHROPLASTY Bilateral    . UMBILICAL HERNIA REPAIR N/A  02/07/2017    Procedure: HERNIA REPAIR UMBILICAL ADULT;  Surgeon: Donnie Mesa, MD;  Location: [facility] OR;  Service: General;  Laterality: N/A;         Family History  Problem Relation Age of Onset  . Heart Problems Mother    . Stroke Father    . Cancer Unknown      Social History:  reports that he has  never smoked. He has never used smokeless tobacco. He reports that he does not drink alcohol or use drugs. Allergies:       Allergies  Allergen Reactions  . Aspirin        Unknown, per pt  . Demerol        Unknown, per pt          Medications Prior to Admission  Medication Sig Dispense Refill  . amLODipine (NORVASC) 10 MG tablet TAKE ONE TABLET BY MOUTH ONCE DAILY 90 tablet 1  . amoxicillin (AMOXIL) 500 MG capsule Take 2,000 mg by mouth as directed.       [name] atorvastatin (LIPITOR) 20 MG tablet TAKE ONE TABLET BY MOUTH ONCE DAILY 90 tablet 3  . carvedilol (COREG) 12.5 MG tablet TAKE ONE TABLET BY MOUTH TWICE DAILY 180 tablet 3  . Coenzyme Q10 (CO Q 10) 100 MG CAPS Take 1 capsule by mouth every morning.      . dorzolamide-timolol (COSOPT) 22.3-6.8 MG/ML ophthalmic solution Place 1 drop into both eyes 2 (two) times daily.      [name] glimepiride (AMARYL) 2 MG tablet Take 2 mg by mouth daily before breakfast.       . magnesium oxide (MAG-OX) 400 MG tablet Take 400 mg by mouth daily.      . Multiple Vitamins-Minerals (ICAPS PO) Take 2 tablets by mouth 3 (three) times daily after meals.       . pantoprazole (PROTONIX) 40 MG tablet Take 1 tablet (40 mg total) by mouth daily. 90 tablet 3  . potassium chloride SA (K-DUR,KLOR-CON) 20 MEQ tablet Take 20 mEq by mouth daily.       [name] spironolactone (ALDACTONE) 25 MG tablet TAKE ONE TABLET BY MOUTH ONCE DAILY 45 tablet 3  . telmisartan (MICARDIS) 40 MG tablet Take 40 mg by mouth daily.      [name] torsemide (DEMADEX) 100 MG tablet Take 50 mg by mouth daily.       [name] warfarin (COUMADIN) 5 MG tablet Take 1 tablet daily except 1/2 tablet on Mondays, Wednesdays and Fridays (Patient taking differently: Take 1 tablet daily except 1/2 tablet on sundays, Tuesday, thursday and saturdays) 30 tablet 12      Home: [place] expects to be discharged to:: Private residence Living Arrangements: Spouse/significant other Available Help at Discharge: Family,  Available 24 hours/day Type of Home: House Home Access: Level entry (into bottom level) Home Layout: Multi-level Alternate Level Stairs-Number of Steps: 7 Alternate Level Stairs-Rails: Right Bathroom Shower/Tub: Multimedia programmer: Standard Home Equipment: Environmental consultant - standard  Lives With: Spouse   Functional History: Prior Function Level of Independence: Independent Comments: Pt reports he was fully independent PTA, including driving.  He endorses fall in July    Functional Status:  Mobility: Bed Mobility Overal bed mobility: Needs Assistance Bed Mobility: Supine to Sit Supine to sit: Mod assist, HOB elevated Sit to supine: Min guard General bed mobility comments: Pt requires cues for sequencing and assist to move LEs off EOB and to lift trunk  and to scoot hips to EOB Transfers  Overall transfer level: Needs assistance Equipment used: Rolling walker (2 wheeled) Transfers: Sit to/from Stand, Stand Pivot Transfers Sit to Stand: Min assist, +2 physical assistance, +2 safety/equipment Stand pivot transfers: Min assist, +2 physical assistance, +2 safety/equipment General transfer comment: Pt requires assist to power up into standing and assist to steady  Ambulation/Gait Ambulation/Gait assistance: +2 physical assistance, Min assist Ambulation Distance (Feet): 10 Feet Assistive device: Rolling walker (2 wheeled) Gait Pattern/deviations: Step-through pattern, Decreased step length - right, Decreased step length - left, Trunk flexed General Gait Details: Assist for balance and support. Verbal cues to stand more erect.  Gait velocity: decr Gait velocity interpretation: Below normal speed for age/gender   ADL: ADL Overall ADL's : Needs assistance/impaired Eating/Feeding: NPO Grooming: Wash/dry hands, Wash/dry face, Oral care, Brushing hair, Set up, Sitting Upper Body Bathing: Minimal assistance, Sitting Lower Body Bathing: Maximal assistance, Sit to/from stand Upper  Body Dressing : Moderate assistance, Sitting Lower Body Dressing: Total assistance, Sit to/from stand Toilet Transfer: Minimal assistance, +2 for physical assistance, +2 for safety/equipment, Stand-pivot, BSC, RW Toileting- Clothing Manipulation and Hygiene: Maximal assistance, Sit to/from stand Functional mobility during ADLs: Minimal assistance, +2 for physical assistance, +2 for safety/equipment, Rolling walker General ADL Comments: Pt limited by pain    Cognition: Cognition Overall Cognitive Status: Impaired/Different from baseline Arousal/Alertness: Awake/alert Orientation Level: Oriented X4 Attention: Sustained Sustained Attention: Appears intact Cognition Arousal/Alertness: Awake/alert Behavior During Therapy: Flat affect Overall Cognitive Status: Impaired/Different from baseline Area of Impairment: Attention, Following commands, Awareness, Problem solving Current Attention Level: Selective Following Commands: Follows multi-step commands with increased time Awareness: Intellectual Problem Solving: Slow processing, Difficulty sequencing, Decreased initiation, Requires verbal cues   Physical Exam: Blood pressure 134/78, pulse 81, temperature (!) 97.5 F (36.4 C), temperature source Oral, resp. rate (!) 24, height '5\' 9"'$  (1.753 m), weight 113 kg (249 lb 3.2 oz), SpO2 98 %. Physical Exam  Vitals reviewed. HENT:  Head: Normocephalic.  Eyes: EOM are normal.  Neck: Normal range of motion. Neck supple. No thyromegaly present.  Cardiovascular:  Cardiac rate control  Respiratory: Effort normal and breath sounds normal. No respiratory distress.  GI: Soft. Bowel sounds are normal.  Abdomen is mildly distended and nontender with colostomy in place  Neurological: He is alert.  Mood is a bit flat but appropriate. Follows commands. Some mild delay in processing.  Patient moves all extremities. Grimaces to deep palpation.   Lab Results Last 48 Hours  Results for orders placed or  performed during the hospital encounter of 01/30/17 (from the past 48 hour(s))  Glucose, capillary     Status: None    Collection Time: 02/09/17  3:34 PM  Result Value Ref Range    Glucose-Capillary 95 65 - 99 mg/dL    Comment 1 Capillary Specimen      Comment 2 Notify RN    Glucose, capillary     Status: None    Collection Time: 02/09/17  8:06 PM  Result Value Ref Range    Glucose-Capillary 96 65 - 99 mg/dL    Comment 1 Notify RN    Glucose, capillary     Status: None    Collection Time: 02/09/17 11:39 PM  Result Value Ref Range    Glucose-Capillary 99 65 - 99 mg/dL    Comment 1 Capillary Specimen    Glucose, capillary     Status: Abnormal    Collection Time: 02/10/17  3:35 AM  Result Value Ref Range    Glucose-Capillary 103 (H) 65 - 99  mg/dL    Comment 1 Capillary Specimen    Basic metabolic panel     Status: Abnormal    Collection Time: 02/10/17  4:50 AM  Result Value Ref Range    Sodium 139 135 - 145 mmol/L    Potassium 4.7 3.5 - 5.1 mmol/L    Chloride 108 101 - 111 mmol/L    CO2 20 (L) 22 - 32 mmol/L    Glucose, Bld 99 65 - 99 mg/dL    BUN 13 6 - 20 mg/dL    Creatinine, Ser 0.85 0.61 - 1.24 mg/dL    Calcium 8.5 (L) 8.9 - 10.3 mg/dL    GFR calc non Af Amer >60 >60 mL/min    GFR calc Af Amer >60 >60 mL/min      Comment: (NOTE) The eGFR has been calculated using the CKD EPI equation. This calculation has not been validated in all clinical situations. eGFR's persistently <60 mL/min signify possible Chronic Kidney Disease.      Anion gap 11 5 - 15  Glucose, capillary     Status: None    Collection Time: 02/10/17  8:34 AM  Result Value Ref Range    Glucose-Capillary 87 65 - 99 mg/dL    Comment 1 Capillary Specimen      Comment 2 Notify RN    Hemoglobin A1c     Status: Abnormal    Collection Time: 02/10/17  9:49 AM  Result Value Ref Range    Hgb A1c MFr Bld 6.4 (H) 4.8 - 5.6 %      Comment: (NOTE) Pre diabetes:          5.7%-6.4% Diabetes:               >6.4% Glycemic control for   <7.0% adults with diabetes      Mean Plasma Glucose 136.98 mg/dL  Glucose, capillary     Status: Abnormal    Collection Time: 02/10/17 12:03 PM  Result Value Ref Range    Glucose-Capillary 101 (H) 65 - 99 mg/dL    Comment 1 Capillary Specimen      Comment 2 Notify RN    Glucose, capillary     Status: Abnormal    Collection Time: 02/10/17  4:08 PM  Result Value Ref Range    Glucose-Capillary 128 (H) 65 - 99 mg/dL    Comment 1 Capillary Specimen      Comment 2 Notify RN    Heparin level (unfractionated)     Status: None    Collection Time: 02/10/17  7:44 PM  Result Value Ref Range    Heparin Unfractionated 0.39 0.30 - 0.70 IU/mL      Comment:        IF HEPARIN RESULTS ARE BELOW EXPECTED VALUES, AND PATIENT DOSAGE HAS BEEN CONFIRMED, SUGGEST FOLLOW UP TESTING OF ANTITHROMBIN III LEVELS.    Glucose, capillary     Status: Abnormal    Collection Time: 02/10/17  8:28 PM  Result Value Ref Range    Glucose-Capillary 151 (H) 65 - 99 mg/dL    Comment 1 Notify RN    Glucose, capillary     Status: Abnormal    Collection Time: 02/10/17 10:01 PM  Result Value Ref Range    Glucose-Capillary 138 (H) 65 - 99 mg/dL    Comment 1 Notify RN    Comprehensive metabolic panel     Status: Abnormal    Collection Time: 02/11/17  2:11 AM  Result Value Ref Range    Sodium  139 135 - 145 mmol/L    Potassium 4.1 3.5 - 5.1 mmol/L    Chloride 107 101 - 111 mmol/L    CO2 27 22 - 32 mmol/L    Glucose, Bld 141 (H) 65 - 99 mg/dL    BUN 12 6 - 20 mg/dL    Creatinine, Ser 7.37 0.61 - 1.24 mg/dL    Calcium 8.3 (L) 8.9 - 10.3 mg/dL    Total Protein 5.4 (L) 6.5 - 8.1 g/dL    Albumin 2.4 (L) 3.5 - 5.0 g/dL    AST 13 (L) 15 - 41 U/L    ALT 17 17 - 63 U/L    Alkaline Phosphatase 24 (L) 38 - 126 U/L    Total Bilirubin 1.0 0.3 - 1.2 mg/dL    GFR calc non Af Amer >60 >60 mL/min    GFR calc Af Amer >60 >60 mL/min      Comment: (NOTE) The eGFR has been calculated using the CKD EPI  equation. This calculation has not been validated in all clinical situations. eGFR's persistently <60 mL/min signify possible Chronic Kidney Disease.      Anion gap 5 5 - 15  Heparin level (unfractionated)     Status: None    Collection Time: 02/11/17  2:11 AM  Result Value Ref Range    Heparin Unfractionated 0.39 0.30 - 0.70 IU/mL      Comment:        IF HEPARIN RESULTS ARE BELOW EXPECTED VALUES, AND PATIENT DOSAGE HAS BEEN CONFIRMED, SUGGEST FOLLOW UP TESTING OF ANTITHROMBIN III LEVELS.    CBC     Status: Abnormal    Collection Time: 02/11/17  2:11 AM  Result Value Ref Range    WBC 7.9 4.0 - 10.5 K/uL    RBC 2.85 (L) 4.22 - 5.81 MIL/uL    Hemoglobin 8.7 (L) 13.0 - 17.0 g/dL    HCT 30.8 (L) 16.8 - 52.0 %    MCV 95.4 78.0 - 100.0 fL    MCH 30.5 26.0 - 34.0 pg    MCHC 32.0 30.0 - 36.0 g/dL    RDW 38.7 06.5 - 82.6 %    Platelets 161 150 - 400 K/uL  Glucose, capillary     Status: Abnormal    Collection Time: 02/11/17  8:35 AM  Result Value Ref Range    Glucose-Capillary 106 (H) 65 - 99 mg/dL  Glucose, capillary     Status: Abnormal    Collection Time: 02/11/17 11:41 AM  Result Value Ref Range    Glucose-Capillary 154 (H) 65 - 99 mg/dL    Comment 1 Capillary Specimen         Imaging Results (Last 48 hours)  Mr [facility] Wo Contrast   Result Date: 02/10/2017 CLINICAL DATA:  Encephalopathy. Follow-up stroke. History of hypertension, hyperlipidemia, atrial fibrillation. EXAM: MRA HEAD WITHOUT CONTRAST MRA NECK WITHOUT CONTRAST TECHNIQUE: Angiographic images of the Circle of Willis were obtained using MRA technique without intravenous contrast. Angiographic images of the neck were obtained using MRA technique without intravenous contrast. Carotid stenosis measurements (when applicable) are obtained utilizing NASCET criteria, using the distal internal carotid diameter as the denominator. COMPARISON:  MRI of the head February 09, 2017 FINDINGS: MRA HEAD FINDINGS ANTERIOR CIRCULATION:  Normal flow related enhancement of the included cervical, petrous, cavernous and supraclinoid internal carotid arteries. Patent anterior communicating artery. Patent anterior and middle cerebral arteries, including distal segments. No large vessel occlusion, high-grade stenosis, aneurysm. POSTERIOR CIRCULATION: LEFT vertebral artery is  dominant. Basilar artery is patent, with normal flow related enhancement of the main branch vessels. Severe stenosis distal RIGHT V4 segment after the RIGHT posterior-inferior cerebellar artery takeoff. Moderate stenosis mid basilar artery. Patent posterior cerebral arteries. Robust LEFT posterior communicating artery present. No large vessel occlusion, high-grade stenosis,  aneurysm. ANATOMIC VARIANTS: None. Source images and MIP images were reviewed. MRA NECK FINDINGS- generalized poor signal ANTERIOR CIRCULATION: Due to time-of-flight technique arch vessel origins not characterize. Common carotid artery's of patent bilaterally. Potentially hemodynamically significant RIGHT internal carotid artery stenosis, not quantifiable. LEFT internal carotid artery appears widely patent. POSTERIOR CIRCULATION: Bilateral vertebral arteries are patent to the vertebrobasilar junction. No definite flow limiting stenosis or luminal irregularity. Source images and MIP images were reviewed. IMPRESSION: MRI head: 1. No emergent large vessel occlusion. 2. No significant stenosis anterior circulation. Severe stenosis RIGHT V4 segment and moderate stenosis basilar artery. MRA NECK: 1. Technically limited. Suspected potentially hemodynamically significant stenosis RIGHT internal carotid artery origin. Electronically Signed   By: Elon Alas M.D.   On: 02/10/2017 05:03    [name] Neck Wo Contrast   Result Date: 02/10/2017 CLINICAL DATA:  Encephalopathy. Follow-up stroke. History of hypertension, hyperlipidemia, atrial fibrillation. EXAM: MRA HEAD WITHOUT CONTRAST MRA NECK WITHOUT CONTRAST  TECHNIQUE: Angiographic images of the Circle of Willis were obtained using MRA technique without intravenous contrast. Angiographic images of the neck were obtained using MRA technique without intravenous contrast. Carotid stenosis measurements (when applicable) are obtained utilizing NASCET criteria, using the distal internal carotid diameter as the denominator. COMPARISON:  MRI of the head February 09, 2017 FINDINGS: MRA HEAD FINDINGS ANTERIOR CIRCULATION: Normal flow related enhancement of the included cervical, petrous, cavernous and supraclinoid internal carotid arteries. Patent anterior communicating artery. Patent anterior and middle cerebral arteries, including distal segments. No large vessel occlusion, high-grade stenosis, aneurysm. POSTERIOR CIRCULATION: LEFT vertebral artery is dominant. Basilar artery is patent, with normal flow related enhancement of the main branch vessels. Severe stenosis distal RIGHT V4 segment after the RIGHT posterior-inferior cerebellar artery takeoff. Moderate stenosis mid basilar artery. Patent posterior cerebral arteries. Robust LEFT posterior communicating artery present. No large vessel occlusion, high-grade stenosis,  aneurysm. ANATOMIC VARIANTS: None. Source images and MIP images were reviewed. MRA NECK FINDINGS- generalized poor signal ANTERIOR CIRCULATION: Due to time-of-flight technique arch vessel origins not characterize. Common carotid artery's of patent bilaterally. Potentially hemodynamically significant RIGHT internal carotid artery stenosis, not quantifiable. LEFT internal carotid artery appears widely patent. POSTERIOR CIRCULATION: Bilateral vertebral arteries are patent to the vertebrobasilar junction. No definite flow limiting stenosis or luminal irregularity. Source images and MIP images were reviewed. IMPRESSION: MRI head: 1. No emergent large vessel occlusion. 2. No significant stenosis anterior circulation. Severe stenosis RIGHT V4 segment and moderate  stenosis basilar artery. MRA NECK: 1. Technically limited. Suspected potentially hemodynamically significant stenosis RIGHT internal carotid artery origin. Electronically Signed   By: Elon Alas M.D.   On: 02/10/2017 05:03             Medical Problem List and Plan: 1.  Decreased functional mobility debilitation/encephalopathy secondary to colon cancer status post colectomy with creation of descending colostomy 02/07/2017/patchy acute small left MCA and MCA/ACA territory infarcts likely embolic 2.  DVT Prophylaxis/Anticoagulation: Eliquis 3. Pain Management: Tylenol as needed 4. Mood: Provide emotional support 5. Neuropsych: This patient is capable of making decisions on his own behalf. 6. Skin/Wound Care: Routine skin checks 7. Fluids/Electrolytes/Nutrition: Routine I&O's will follow-up chemistry 8. ABLA. Follow-up CBC 9. Hypertension. Coreg 3.125 mg twice  a day, Lasix 40 mg daily. 10. CAD/CABG/AVR 2006. Follow-up cardiology services as needed. Continue anticoagulation 11. Type 2 diabetes mellitus. Hemoglobin A1c 6.4.SSI. Check blood sugars before meals and at bedtime 12. Hyperlipidemia. Lipitor   13. Incidental finding meningioma follow-up with neurology as outpatient Post Admission Physician Evaluation: 1. Functional deficits secondary  to deconditioning after resection of distal sigmoid colon malignancy. 2. Patient is admitted to receive collaborative, interdisciplinary care between the physiatrist, rehab nursing staff, and therapy team. 3. Patient's level of medical complexity and substantial therapy needs in context of that medical necessity cannot be provided at a lesser intensity of care such as a SNF. 4. Patient has experienced substantial functional loss from his/her baseline which was documented above under the "Functional History" and "Functional Status" headings.  Judging by the patient's diagnosis, physical exam, and functional history, the patient has potential for  functional progress which will result in measurable gains while on inpatient rehab.  These gains will be of substantial and practical use upon discharge  in facilitating mobility and self-care at the household level. 5. Physiatrist will provide 24 hour management of medical needs as well as oversight of the therapy plan/treatment and provide guidance as appropriate regarding the interaction of the two. 6. The Preadmission Screening has been reviewed and patient status is unchanged unless otherwise stated above. 7. 24 hour rehab nursing will assist with bladder management, bowel management, safety, skin/wound care, disease management, medication administration, pain management and patient education  and help integrate therapy concepts, techniques,education, etc. 8. PT will assess and treat for/with: pre gait, gait training, endurance , safety, equipment, neuromuscular re education.   Goals are: Mod I/Sup. 9. OT will assess and treat for/with: ADLs, Cognitive perceptual skills, Neuromuscular re education, safety, endurance, equipment.   Goals are: Mod I /Sup. Therapy may not proceed with showering this patient. 10. SLP will assess and treat for/with: NA.  Goals are: NA. 11. Case Management and Social Worker will assess and treat for psychological issues and discharge planning. 12. Team conference will be held weekly to assess progress toward goals and to determine barriers to discharge. 13. Patient will receive at least 3 hours of therapy per day at least 5 days per week. 14. ELOS: 10-15d       15. Prognosis:  good         Charlett Blake M.D. [facility] FAAPM&R (Sports Med, Neuromuscular Med) Diplomate Am Board of Electrodiagnostic Med  Cathlyn Parsons., PA-C 02/11/2017

## 2017-02-14 NOTE — Consult Note (Addendum)
[place] Nurse ostomy follow up Surgical team following for assessment and plan of care for abd wound.  Stoma type/location: Colostomy stoma from surgery on 8/16 Stomal assessment/size: Stoma red and viable, peristomal edges bleed slightly when pouch was removed,  1 1/2 inches, slightly above skin level. Peristomal assessment: Intact skin surrounding Output: Scant amt brown stool in the pouch Ostomy pouching: 1pc.  Education provided:  Demonstrated pouch change using one piece pouch and barrier ring to maintain a seal. Pt watched procedure but did not participate.  No family present.  Will continue teaching sessions when pt is in rehab.  Supplies at the bedside for staff nurse use. Enrolled patient in [place] Start Discharge program: No Julien Girt MSN, [place], [place], [place]

## 2017-02-15 ENCOUNTER — Inpatient Hospital Stay (HOSPITAL_COMMUNITY): Payer: Medicare Other | Admitting: Physical Therapy

## 2017-02-15 ENCOUNTER — Inpatient Hospital Stay (HOSPITAL_COMMUNITY): Payer: Medicare Other | Admitting: Speech Pathology

## 2017-02-15 ENCOUNTER — Inpatient Hospital Stay (HOSPITAL_COMMUNITY): Payer: Medicare Other

## 2017-02-15 LAB — CBC WITH DIFFERENTIAL/PLATELET
Basophils Absolute: 0 10*3/uL (ref 0.0–0.1)
Basophils Relative: 0 %
Eosinophils Absolute: 0.3 10*3/uL (ref 0.0–0.7)
Eosinophils Relative: 4 %
HEMATOCRIT: 29.7 % — AB (ref 39.0–52.0)
HEMOGLOBIN: 9.6 g/dL — AB (ref 13.0–17.0)
LYMPHS ABS: 1.4 10*3/uL (ref 0.7–4.0)
LYMPHS PCT: 17 %
MCH: 31 pg (ref 26.0–34.0)
MCHC: 32.3 g/dL (ref 30.0–36.0)
MCV: 95.8 fL (ref 78.0–100.0)
MONOS PCT: 8 %
Monocytes Absolute: 0.6 10*3/uL (ref 0.1–1.0)
NEUTROS PCT: 71 %
Neutro Abs: 5.6 10*3/uL (ref 1.7–7.7)
Platelets: 222 10*3/uL (ref 150–400)
RBC: 3.1 MIL/uL — AB (ref 4.22–5.81)
RDW: 13.8 % (ref 11.5–15.5)
WBC: 7.9 10*3/uL (ref 4.0–10.5)

## 2017-02-15 LAB — GLUCOSE, CAPILLARY
GLUCOSE-CAPILLARY: 166 mg/dL — AB (ref 65–99)
Glucose-Capillary: 150 mg/dL — ABNORMAL HIGH (ref 65–99)
Glucose-Capillary: 163 mg/dL — ABNORMAL HIGH (ref 65–99)
Glucose-Capillary: 179 mg/dL — ABNORMAL HIGH (ref 65–99)

## 2017-02-15 LAB — COMPREHENSIVE METABOLIC PANEL
ALK PHOS: 26 U/L — AB (ref 38–126)
ALT: 25 U/L (ref 17–63)
AST: 26 U/L (ref 15–41)
Albumin: 2.5 g/dL — ABNORMAL LOW (ref 3.5–5.0)
Anion gap: 9 (ref 5–15)
BILIRUBIN TOTAL: 1 mg/dL (ref 0.3–1.2)
BUN: 7 mg/dL (ref 6–20)
CALCIUM: 8.6 mg/dL — AB (ref 8.9–10.3)
CO2: 29 mmol/L (ref 22–32)
CREATININE: 0.76 mg/dL (ref 0.61–1.24)
Chloride: 98 mmol/L — ABNORMAL LOW (ref 101–111)
Glucose, Bld: 158 mg/dL — ABNORMAL HIGH (ref 65–99)
Potassium: 3.9 mmol/L (ref 3.5–5.1)
Sodium: 136 mmol/L (ref 135–145)
TOTAL PROTEIN: 5.8 g/dL — AB (ref 6.5–8.1)

## 2017-02-15 MED ORDER — SODIUM CHLORIDE 0.9 % IV SOLN
3.0000 g | Freq: Four times a day (QID) | INTRAVENOUS | Status: DC
  Administered 2017-02-15 – 2017-02-20 (×20): 3 g via INTRAVENOUS
  Filled 2017-02-15 (×23): qty 3

## 2017-02-15 NOTE — Progress Notes (Signed)
Patient information reviewed and entered into eRehab system by Sullivan Jacuinde, RN, CRRN, PPS Coordinator.  Information including medical coding and functional independence measure will be reviewed and updated through discharge.     Per nursing patient was given "Data Collection Information Summary for Patients in Inpatient Rehabilitation Facilities with attached "Privacy Act Statement-Health Care Records" upon admission.  

## 2017-02-15 NOTE — Evaluation (Signed)
Physical Therapy Assessment and Plan  Patient Details  Name: Terry Wu MRN: [id number] Date of Birth: 1931/07/26  PT Diagnosis: Coordination disorder, Difficulty walking and Muscle weakness Rehab Potential: Good ELOS: 10-14   Today's Date: 02/15/2017 PT Individual Time: 1500-1606 PT Individual Time Calculation (min): 66 min    Problem List:  Patient Active Problem List   Diagnosis Date Noted  . Left middle cerebral artery stroke ([place]) 02/14/2017  . Debility 02/14/2017  . Cerebral embolism with cerebral infarction 02/10/2017  . Colostomy in place [facility])   . Persistent atrial fibrillation ([place])   . Acute respiratory failure ([place])   . Acute respiratory failure with hypoxia ([place])   . S/P abdominal surgery, follow-up exam   . Preoperative cardiovascular examination   . Chronic diastolic heart failure ([place]) 02/01/2017  . Dehydration 02/01/2017  . Acute kidney injury ([place]) 02/01/2017  . Diarrhea 01/30/2017  . Gait difficulty 08/22/2015  . Dizziness 08/22/2015  . Chronic atrial fibrillation ([place]) 08/22/2015  . Aortic aneurysm ([place]) 02/01/2014  . CAP (community acquired pneumonia) 02/12/2013  . Hyponatremia 02/12/2013  . COPD (chronic obstructive pulmonary disease) ([place]) 02/12/2013  . Bilateral leg edema 02/12/2013  . DIZZINESS 01/26/2010  . SHORTNESS OF BREATH 10/29/2008  . Diabetes type 2, controlled ([place]) 07/23/2008  . Mixed hyperlipidemia 07/23/2008  . HYPERTENSION, BENIGN 07/23/2008  . ESOPHAGEAL REFLUX 07/23/2008  . S/P AVR 07/23/2008  . CORONARY ARTERY BYPASS GRAFT, HX OF 07/23/2008    Past Medical History:  Past Medical History:  Diagnosis Date  . Aortic aneurysm (HCC)    Ascending thoracic - 4.8 cm by CT 2017  . Aortic regurgitation    Bioprosthetic AVR 2006  . Atrial fibrillation with controlled ventricular response ([place]) 08/22/2015   CHADS2VASC=5, on Coumadin  . CAD (coronary artery disease)    Multivessel status post CABG 2006  . Complication of  anesthesia    demerol per wife  . Esophageal reflux   . Essential hypertension   . History of dizziness   . Mixed hyperlipidemia   . Type 2 diabetes mellitus ([place])    Past Surgical History:  Past Surgical History:  Procedure Laterality Date  . AORTIC VALVE REPLACEMENT  2006   Edwards Pericardial tissue valve  . COLON RESECTION SIGMOID N/A 02/07/2017   Procedure: SIGMOID COLECTOMY WITH COLOSTOMY;  Surgeon: Donnie Mesa, MD;  Location: [facility];  Service: General;  Laterality: N/A;  . COLONOSCOPY N/A 02/05/2017   Procedure: COLONOSCOPY;  Surgeon: Rogene Houston, MD;  [facility];  Service: Endoscopy;  Laterality: N/A;  . CORONARY ARTERY BYPASS GRAFT  2006   LIMA to LAD, SVG to diagonal, SVG to OM, SVG to PDA   . HERNIA REPAIR    . TOTAL KNEE ARTHROPLASTY Bilateral   . UMBILICAL HERNIA REPAIR N/A 02/07/2017   Procedure: HERNIA REPAIR UMBILICAL ADULT;  Surgeon: Donnie Mesa, MD;  Location: [place];  Service: General;  Laterality: N/A;    Assessment & Plan Clinical Impression: Terry Wu is a 83 y.o. right handed male with history of aortic aneurysm, atrial fibrillation, CAD with CABG/AVR 2006  maintained on chronic Coumadin, hypertension , type 2 diabetes mellitus and history of prostate cancer status post radioactive seed implant.  Presented 01/30/2017 with diarrhea 2 weeks and increasing shortness of breath with ambulation. He denied any abdominal pain or rectal bleeding. CT of abdomen and pelvis showed no acute intra-abdominal or intrapelvic process identified. Clostridium difficile specimen negative. Underwent colonoscopy 02/05/2017 per gastroenterology that showed proximal  sigmoid colon, mid sigmoid colon and descending colon to be normal. There was a malignant tumor in the distal sigmoid colon that was biopsied. Later underwent sigmoid colectomy with creation of descending colostomy after new diagnosis of adenocarcinoma per biopsy 02/07/2017 per general surgery. Patient was  extubated 02/09/2007. MRI of the brain due to postoperative confusion and word finding difficulty showed patchy acute small left MCA territory infarcts. 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 MRA with no emergent large vessel occlusion. Carotid Dopplers had no ICA stenosis. His chronic Coumadin currently is on hold and was transitioned to Eliquis per follow-up of cardiology services. Acute blood loss anemia monitored. Tolerating a mechanical soft diet.  Patient transferred to CIR on 02/14/2017 .   Patient currently requires mod with mobility secondary to muscle weakness, decreased cardiorespiratoy endurance and decreased standing balance, decreased postural control and decreased balance strategies.  Prior to hospitalization, patient was independent  with mobility and lived with Spouse, Daughter in a House home.  Home access is 1 (small step)Stairs to enter.  Patient will benefit from skilled PT intervention to maximize safe functional mobility, minimize fall risk and decrease caregiver burden for planned discharge home with 24 hour supervision.  Anticipate patient will benefit from follow up [place] at discharge.  PT - End of Session Activity Tolerance: Tolerates < 10 min activity, no significant change in vital signs Endurance Deficit: Yes Endurance Deficit Description: requires frequent rest breaks throughout session, unable to tolerate 90 minute session PT Assessment Rehab Potential (ACUTE/IP ONLY): Good PT Patient demonstrates impairments in the following area(s): Balance;Endurance;Motor PT Transfers Functional Problem(s): Bed Mobility;Bed to Chair;Car;Furniture PT Locomotion Functional Problem(s): Stairs;Ambulation PT Plan PT Intensity: Minimum of 1-2 x/day ,45 to 90 minutes PT Frequency: 5 out of 7 days PT Duration Estimated Length of Stay: 10-14 PT Treatment/Interventions: Ambulation/gait training;Balance/vestibular training;Discharge planning;Community reintegration;DME/adaptive equipment  instruction;Functional mobility training;Patient/family education;Neuromuscular re-education;Psychosocial support;Splinting/orthotics;Therapeutic Exercise;Therapeutic Activities;Stair training;UE/LE Strength taining/ROM;UE/LE Coordination activities;Wheelchair propulsion/positioning PT Transfers Anticipated Outcome(s): mod I PT Locomotion Anticipated Outcome(s): supervision, ambulatory with LRAD PT Recommendation Follow Up Recommendations: Home health PT;24 hour supervision/assistance Patient destination: Home Equipment Recommended: To be determined  Skilled Therapeutic Intervention No c/o pain.  Session focus on PT evaluation, activity tolerance, DME training, and pt/family education regarding rehab process, estimated length of stay, goals of PT, plan of care, and safety plan.  Pt currently performing mobility min/mod assist overall, as below.  PT provided instruction for transfers and ambulation using RW.  W/C mobility x90' with frequent short rest breaks for UE strengthening and overall endurance.  Pt unable to tolerate 90 minute session, reporting fatigue and requesting to return to bed.  HR 96.  Call bell in reach and needs met.   PT Evaluation Precautions/Restrictions Precautions Precautions: Fall Precaution Comments: deconditioning General PT Amount of Missed Time (min): 24 Minutes PT Missed Treatment Reason: Patient fatigue (HR 96)  Vital Signs  Therapy Vitals Pulse Rate: 96 Oxygen Therapy SpO2: 97 % O2 Device: Not Delivered Pain Pain Assessment Pain Assessment: No/denies pain Home Living/Prior Functioning Home Living Available Help at Discharge: Family;Available 24 hours/day Type of Home: House Home Access: Stairs to enter [employer] of Steps: 1 (small step) Entrance Stairs-Rails: None Home Layout: Multi-level;Able to live on main level with bedroom/bathroom (split level) Alternate Level Stairs-Number of Steps: 7 Alternate Level Stairs-Rails: Right  Lives  With: Spouse;Daughter Prior Function Level of Independence: Independent with gait;Independent with transfers  Able to Take Stairs?: Yes Driving: No (x6 months 2/2 visual impairments) 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 Pt requires VC for locating items on L and inconsistant confrontation testing on L field Perception Perception:  (to be further tested in functional context) Praxis Praxis: Intact  Cognition Overall Cognitive Status: Impaired/Different from baseline Arousal/Alertness: Awake/alert Orientation Level: Oriented X4 Attention: Sustained Sustained Attention: Appears intact Memory: Impaired Memory Impairment: Decreased recall of new information Awareness: Impaired Awareness Impairment: Intellectual impairment Problem Solving: Impaired Problem Solving Impairment: Verbal basic;Functional complex Safety/Judgment: Impaired Sensation Sensation Light Touch: Appears Intact (BLEs) Coordination Gross Motor Movements are Fluid and Coordinated: No Fine Motor Movements are Fluid and Coordinated: No Finger Nose Finger Test: very mild dysmetria Heel Shin Test: decreased ROM, somewhat affeted by body habitus Motor  Motor Motor - Discharge Observations: generalized weakness throughout  Mobility Bed Mobility Bed Mobility: Not assessed Transfers Transfers: Yes Sit to Stand: 3: Mod assist Sit to Stand Details: Verbal cues for sequencing;Verbal cues for technique Stand to Sit: 4: Min assist Stand to Sit Details (indicate cue type and reason): Verbal cues for sequencing;Verbal cues for technique Stand Pivot Transfers: 3: Mod assist Stand Pivot Transfer Details: Verbal cues for sequencing;Verbal cues for technique Locomotion     Trunk/Postural Assessment  Cervical Assessment Cervical Assessment: Within Functional Limits Thoracic Assessment Thoracic Assessment:   (rounded shoudlers) Lumbar Assessment Lumbar Assessment:  (preference for posterior pelvic tilt, adjusts with verbal cues) Postural Control Postural Control: Within Functional Limits  Balance Balance Balance Assessed: Yes Static Sitting Balance Static Sitting - Balance Support: No upper extremity supported Static Sitting - Level of Assistance: 5: Stand by assistance Static Standing Balance Static Standing - Balance Support: During functional activity;Right upper extremity supported Static Standing - Level of Assistance: 4: Min assist Dynamic Standing Balance Dynamic Standing - Balance Support: Left upper extremity supported;During functional activity Dynamic Standing - Level of Assistance: 4: Min assist Extremity Assessment      RLE Assessment RLE Assessment: Exceptions to [facility] RLE Strength Right Hip Flexion: 3+/5 Right Knee Flexion: 4/5 Right Knee Extension: 4/5 Right Ankle Dorsiflexion: 4/5 Right Ankle Plantar Flexion: 4/5 LLE Assessment LLE Assessment: Exceptions to WFL LLE AROM (degrees) LLE Overall AROM Comments: WFL assessed in sitting LLE Strength Left Hip Flexion: 3+/5 Left Knee Flexion: 4/5 Left Knee Extension: 4/5 Left Ankle Dorsiflexion: 4/5 Left Ankle Plantar Flexion: 4/5   See Function Navigator for Current Functional Status.   Refer to Care Plan for Long Term Goals  Recommendations for other services: None   Discharge Criteria: Patient will be discharged from PT if patient refuses treatment 3 consecutive times without medical reason, if treatment goals not met, if there is a change in medical status, if patient makes no progress towards goals or if patient is discharged from hospital.  The above assessment, treatment plan, treatment alternatives and goals were discussed and mutually agreed upon: by patient and by family  Michel Santee 02/15/2017, 4:19 PM

## 2017-02-15 NOTE — Evaluation (Signed)
Speech Language Pathology Assessment and Plan  Patient Details  Name: Terry Wu MRN: [id number] Date of Birth: September 23, 1931  SLP Diagnosis: Dysphagia;Cognitive Impairments;Speech and Language deficits  Rehab Potential: Good ELOS: 10-14 days     Today's Date: 02/15/2017 SLP Individual Time: 7496-6466 SLP Individual Time Calculation (min): 55 min   Problem List:  Patient Active Problem List   Diagnosis Date Noted  . Left middle cerebral artery stroke (HCC) 02/14/2017  . Debility 02/14/2017  . Cerebral embolism with cerebral infarction 02/10/2017  . Colostomy in place [facility])   . Persistent atrial fibrillation (HCC)   . Acute respiratory failure (HCC)   . Acute respiratory failure with hypoxia (HCC)   . S/P abdominal surgery, follow-up exam   . Preoperative cardiovascular examination   . Chronic diastolic heart failure (HCC) 02/01/2017  . Dehydration 02/01/2017  . Acute kidney injury (HCC) 02/01/2017  . Diarrhea 01/30/2017  . Gait difficulty 08/22/2015  . Dizziness 08/22/2015  . Chronic atrial fibrillation (HCC) 08/22/2015  . Aortic aneurysm (HCC) 02/01/2014  . CAP (community acquired pneumonia) 02/12/2013  . Hyponatremia 02/12/2013  . COPD (chronic obstructive pulmonary disease) (HCC) 02/12/2013  . Bilateral leg edema 02/12/2013  . DIZZINESS 01/26/2010  . SHORTNESS OF BREATH 10/29/2008  . Diabetes type 2, controlled (HCC) 07/23/2008  . Mixed hyperlipidemia 07/23/2008  . HYPERTENSION, BENIGN 07/23/2008  . ESOPHAGEAL REFLUX 07/23/2008  . S/P AVR 07/23/2008  . CORONARY ARTERY BYPASS GRAFT, HX OF 07/23/2008   Past Medical History:  Past Medical History:  Diagnosis Date  . Aortic aneurysm (HCC)    Ascending thoracic - 4.8 cm by CT 2017  . Aortic regurgitation    Bioprosthetic AVR 2006  . Atrial fibrillation with controlled ventricular response (HCC) 08/22/2015   CHADS2VASC=5, on Coumadin  . CAD (coronary artery disease)    Multivessel status post CABG 2006  .  Complication of anesthesia    demerol per wife  . Esophageal reflux   . Essential hypertension   . History of dizziness   . Mixed hyperlipidemia   . Type 2 diabetes mellitus (HCC)    Past Surgical History:  Past Surgical History:  Procedure Laterality Date  . AORTIC VALVE REPLACEMENT  2006   Edwards Pericardial tissue valve  . COLON RESECTION SIGMOID N/A 02/07/2017   Procedure: SIGMOID COLECTOMY WITH COLOSTOMY;  Surgeon: Manus Rudd, MD;  Location: [facility] OR;  Service: General;  Laterality: N/A;  . COLONOSCOPY N/A 02/05/2017   Procedure: COLONOSCOPY;  Surgeon: Malissa Hippo, MD;  [facility];  Service: Endoscopy;  Laterality: N/A;  . CORONARY ARTERY BYPASS GRAFT  2006   LIMA to LAD, SVG to diagonal, SVG to OM, SVG to PDA   . HERNIA REPAIR    . TOTAL KNEE ARTHROPLASTY Bilateral   . UMBILICAL HERNIA REPAIR N/A 02/07/2017   Procedure: HERNIA REPAIR UMBILICAL ADULT;  Surgeon: Manus Rudd, MD;  [facility];  Service: General;  Laterality: N/A;    Assessment / Plan / Recommendation Clinical Impression Patient is an 83 y.o.right handed malewith history of aortic aneurysm, atrial fibrillation, CAD with CABG/AVR2006 maintained on chronic Coumadin, hypertension ,type 2 diabetes mellitusand history of prostate cancer status post radioactive seed implant. Per chart review patient lives with spouse. Independent prior to admission and still drivingup until recently. Presented 01/30/2017 with diarrhea 2 weeks and increasing shortness of breath with ambulation. He denied any abdominal pain or rectal bleeding. CT of abdomen and pelvis showed no acute intra-abdominal or intrapelvic process identified. Clostridium difficile  specimen negative. Underwent colonoscopy 02/05/2017 per gastroenterology that showed proximal sigmoid colon, mid sigmoid colon and descending colon to be normal. There was a malignant tumor in the distal sigmoid colon that was biopsied. Later underwent sigmoid  colectomy with creation of descending colostomy after new diagnosis of adenocarcinoma per biopsy 02/07/2017 per general surgery. Patient was extubated 02/09/2007. MRI of the brain due to postoperative confusionand word finding difficultyshowed patchy acute small left MCA territory infarcts. 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 MRA with no emergent large vessel occlusion. Carotid Dopplers had no ICA stenosis. His chronic Coumadin currently is on hold and was transitioned to Eliquisper follow-up of cardiology services.Acute blood loss anemia 9.1and monitored.Tolerating a mechanical soft diet. Physical and Occupational therapy evaluation completed with recommendations of physical medicine rehabilitation consult.Patient was admitted for a comprehensive rehabilitation program 02/14/17.  Patient demonstrates mild-moderate cognitive impairments impacting functional problem solving, recall of new information, intellectual awareness and overall safety with functional and familiar tasks. Patient also demonstrates mild impairments in word-finding at the sentence level with intermittent semantic and phonemic paraphasias with decreased ability to self-monitor and correct. Patient's speech intelligibility was also impaired due to decreased breath support for verbal expression at the sentence level. Patient consumed solid textures mildly prolonged mastication and increased labored breathing. Therefore, recommend patient to continue Dys, 3 textures for energy conservation. Patient also consumed thin liquids via straw and demonstrated overt s/s of aspiration, suspect due to bolus rate and size in addition to suspected delayed swallow initiation. Coughing was eliminated with small, single sips via cup. Patient would benefit from skilled SLP intervention to maximize his cognitive-linguistic and swallowing function as well as his overall functional independence prior to discharge.     Skilled Therapeutic Interventions           Administered a cognitive-linguistic evaluation and BSE. Please see above for details. Educated the patient and his wife in regards to his current swallowing and cognitive-linguistic impairments and goals of skilled SLP intervention. Both verbalized understanding and agreement.   SLP Assessment  Patient will need skilled Speech Lanaguage Pathology Services during CIR admission    Recommendations  SLP Diet Recommendations: Dysphagia 3 (Mech soft);Thin Liquid Administration via: Cup;No straw Medication Administration: Whole meds with puree Supervision: Patient able to self feed;Intermittent supervision to cue for compensatory strategies Compensations: Slow rate;Small sips/bites;Follow solids with liquid Postural Changes and/or Swallow Maneuvers: Seated upright 90 degrees;Upright 30-60 min after meal Oral Care Recommendations: Oral care BID Recommendations for Other Services: Neuropsych consult Patient destination: Home Follow up Recommendations: 24 hour supervision/assistance;Home Health SLP;Outpatient SLP Equipment Recommended: None recommended by SLP    SLP Frequency 3 to 5 out of 7 days   SLP Duration  SLP Intensity  SLP Treatment/Interventions 10-14 days   Minumum of 1-2 x/day, 30 to 90 minutes  Cognitive remediation/compensation;Cueing hierarchy;Functional tasks;Patient/family education;Environmental controls;Dysphagia/aspiration precaution training;Speech/Language facilitation;Internal/external aids;Therapeutic Activities    Pain Pain Assessment Pain Assessment: No/denies pain   Function:  Eating Eating   Modified Consistency Diet: Yes Eating Assist Level: Swallowing techniques: self managed;Set up assist for   Eating Set Up Assist For: Opening containers       Cognition Comprehension Comprehension assist level: Understands basic 90% of the time/cues < 10% of the time  Expression   Expression assist level: Expresses basic 75 - 89% of the time/requires cueing 10 -  24% of the time. Needs helper to occlude trach/needs to repeat words.  Social Interaction Social Interaction assist level: Interacts appropriately 75 - 89% of the time - Needs  redirection for appropriate language or to initiate interaction.  Problem Solving Problem solving assist level: Solves basic 50 - 74% of the time/requires cueing 25 - 49% of the time  Memory Memory assist level: Recognizes or recalls 75 - 89% of the time/requires cueing 10 - 24% of the time   Short Term Goals: Week 1: SLP Short Term Goal 1 (Week 1): Patient will consume current diet with minimal overt s/s of aspiration with mod I for use of swallowing compensatory strategies. SLP Short Term Goal 2 (Week 1): Patient will demonstrate efficient mastication with complete oral clearance of regular textures without overt s/s of aspiration over 2 sessions prior to upgrade.  SLP Short Term Goal 3 (Week 1): Patient will self-monitor and correct verbal errors at the sentence level with Min A verbal cues.  SLP Short Term Goal 4 (Week 1): Patient will demonstrate functional problem solving for basic and familiar tasks with Min A verbal cues.  SLP Short Term Goal 5 (Week 1): Patient will recall new, daily information with Min A verbal and visual cues.  SLP Short Term Goal 6 (Week 1): Patient will self-monitor and correct errors during functional tasks with Min A verbal cues.   Refer to Care Plan for Long Term Goals  Recommendations for other services: Neuropsych  Discharge Criteria: Patient will be discharged from SLP if patient refuses treatment 3 consecutive times without medical reason, if treatment goals not met, if there is a change in medical status, if patient makes no progress towards goals or if patient is discharged from hospital.  The above assessment, treatment plan, treatment alternatives and goals were discussed and mutually agreed upon: by patient and by family  Amelie Caracci 02/15/2017, 2:49 PM

## 2017-02-15 NOTE — IPOC Note (Signed)
Overall Plan of Care [facility]) Patient Details Name: Terry Wu MRN: [id number] DOB: Mar 19, 1932  Admitting Diagnosis: CVA Debility  Hospital Problems: Principal Problem:   Debility Active Problems:   Colostomy in place [facility])   Left middle cerebral artery stroke [facility])     Functional Problem List: Nursing Bladder, Bowel, Edema, Endurance, Medication Management, Motor, Nutrition, Pain, Perception, Safety, Sensory, Skin Integrity, Behavior  PT Balance, Endurance, Motor  OT Balance, Cognition, Endurance, Motor, Pain, Perception, Safety, Vision  SLP Cognition  TR         Basic ADL's: OT Grooming, Bathing, Dressing, Toileting     Advanced  ADL's: OT       Transfers: PT Bed Mobility, Bed to Chair, Car, Manufacturing systems engineer, Metallurgist: PT Stairs, Ambulation     Additional Impairments: OT    SLP Swallowing, Communication, Social Cognition expression Memory, Problem Solving, Awareness  TR      Anticipated Outcomes Item Anticipated Outcome  Self Feeding MOD I  Swallowing  Mod I    Basic self-care  MOD I  Toileting  S- bathing; MOD I- toileting   Bathroom Transfers S-bathing; MOD I toileting  Bowel/Bladder  mod I  Transfers  mod I  Locomotion  supervision, ambulatory with LRAD  Communication  Supervision   Cognition  Supervision-Min A   Pain  ModI  Safety/Judgment  MOd I   Therapy Plan: PT Intensity: Minimum of 1-2 x/day ,45 to 90 minutes PT Frequency: 5 out of 7 days PT Duration Estimated Length of Stay: 10-14 OT Intensity: Minimum of 1-2 x/day, 45 to 90 minutes OT Frequency: 5 out of 7 days OT Duration/Estimated Length of Stay: 10-14 SLP Intensity: Minumum of 1-2 x/day, 30 to 90 minutes SLP Frequency: 3 to 5 out of 7 days SLP Duration/Estimated Length of Stay: 10-14 days     Team Interventions: Nursing Interventions Patient/Family Education, Bladder Management, Bowel Management, Disease Management/Prevention, Pain Management,  Medication Management, Skin Care/Wound Management, Cognitive Remediation/Compensation, Dysphagia/Aspiration Precaution Training, Discharge Planning, Psychosocial Support  PT interventions Ambulation/gait training, Training and development officer, Discharge planning, Community reintegration, DME/adaptive equipment instruction, Functional mobility training, Patient/family education, Neuromuscular re-education, Psychosocial support, Splinting/orthotics, Therapeutic Exercise, Therapeutic Activities, Stair training, UE/LE Strength taining/ROM, UE/LE Coordination activities, Wheelchair propulsion/positioning  OT Interventions Training and development officer, Cognitive remediation/compensation, Academic librarian, Discharge planning, Disease mangement/prevention, Engineer, drilling, Functional mobility training, Neuromuscular re-education, Patient/family education, Psychosocial support, Self Care/advanced ADL retraining, Therapeutic Activities, UE/LE Coordination activities, Visual/perceptual remediation/compensation, Therapeutic Exercise, UE/LE Strength taining/ROM  SLP Interventions Cognitive remediation/compensation, Cueing hierarchy, Functional tasks, Patient/family education, Environmental controls, Dysphagia/aspiration precaution training, Speech/Language facilitation, Internal/external aids, Therapeutic Activities  TR Interventions    SW/CM Interventions Discharge Planning, Psychosocial Support, Patient/Family Education   Barriers to Discharge MD  Medical stability and Medication compliance  Nursing Incontinence, Wound Care    PT      OT Inaccessible home environment, Pending chemo/radiation split level home layout; new cancer diagnosis/treatment  SLP      SW       Team Discharge Planning: Destination: PT-Home ,OT- Home , SLP-Home Projected Follow-up: PT-Home health PT, 24 hour supervision/assistance, OT-  Home health OT, SLP-24 hour supervision/assistance, Home Health SLP,  Outpatient SLP Projected Equipment Needs: PT-To be determined, OT- 3 in 1 bedside comode, To be determined, SLP-None recommended by SLP Equipment Details: PT- , OT-has shower chair Patient/family involved in discharge planning: PT- Patient, Family member/caregiver,  OT-Patient, SLP-Patient, Family member/caregiver  MD ELOS: 10-14 days Medical Rehab Prognosis:  Excellent Assessment:  The patient has been admitted for CIR therapies with the diagnosis of CVA/debility. The team will be addressing functional mobility, strength, stamina, balance, safety, adaptive techniques and equipment, self-care, bowel and bladder mgt, patient and caregiver education, NMR, vestibular assessment, psychosocial support, community reentry, cognition, communication.  Goals have been set at mod I to supervision, with mobility, self-care, cognition, communication. Pt is motivated.    Meredith Staggers, MD, FAAPMR      See Team Conference Notes for weekly updates to the plan of care

## 2017-02-15 NOTE — Care Management Note (Signed)
[place] Individual Statement of Services  Patient Name:  Terry Wu  Date:  02/15/2017  Welcome to the [facility].  Our goal is to provide you with an individualized program based on your diagnosis and situation, designed to meet your specific needs.  With this comprehensive rehabilitation program, you will be expected to participate in at least 3 hours of rehabilitation therapies Monday-Friday, with modified therapy programming on the weekends.  Your rehabilitation program will include the following services:  Physical Therapy (PT), Occupational Therapy (OT), Speech Therapy (ST), 24 hour per day rehabilitation nursing, Neuropsychology, Case Management (Social Worker), Rehabilitation Medicine, Nutrition Services and Pharmacy Services  Weekly team conferences will be held on Wednesday to discuss your progress.  Your Social Worker will talk with you frequently to get your input and to update you on team discussions.  Team conferences with you and your family in attendance may also be held.  Expected length of stay: 10-14 days  Overall anticipated outcome: mod/i-supervision with ambulation  Depending on your progress and recovery, your program may change. Your Social Worker will coordinate services and will keep you informed of any changes. Your Social Worker's name and contact numbers are listed  below.  The following services may also be recommended but are not provided by the [place]:    [place] will be made to provide these services after discharge if needed.  Arrangements include referral to agencies that provide these services.  Your insurance has been verified to be:  Medicare & [place] Your primary doctor is:  Sinda Du  Pertinent information will be shared with your doctor and your insurance company.  Social Worker:  Ovidio Kin, [place] or ([phone]  Information discussed with and copy given to patient by: Elease Hashimoto, 02/15/2017, 11:01 AM

## 2017-02-15 NOTE — Progress Notes (Signed)
Subjective/Complaints:  No issues overnite, no abd pain, will try to eat this am  ROS- denies chest pain SOB, N/V/D Objective: Vital Signs: Blood pressure 122/70, pulse 80, temperature 98.2 F (36.8 C), temperature source Oral, resp. rate 16, height 5\' 9"  (1.753 m), weight 109.7 kg (241 lb 14.3 oz), SpO2 94 %. No results found. Results for orders placed or performed during the hospital encounter of 02/14/17 (from the past 72 hour(s))  Glucose, capillary     Status: Abnormal   Collection Time: 02/14/17  4:48 PM  Result Value Ref Range   Glucose-Capillary 166 (H) 65 - 99 mg/dL  Glucose, capillary     Status: Abnormal   Collection Time: 02/14/17  8:52 PM  Result Value Ref Range   Glucose-Capillary 175 (H) 65 - 99 mg/dL     HEENT: normal Cardio: RRR and no murmur Resp: CTA B/L and unlabored GI: BS diminished and NT, +distended, small amt sanguinous drainage from colostomy Extremity:  Pulses positive and No Edema Skin:   Midline abd incision packed mid wound erythema surrounding skin, serosang drinage on 4x4s Neuro: Alert/Oriented and Abnormal Motor 4/5 in BUE and BLE Musc/Skel:  Other no pain with UE or LE ROM, onychogryphosis bilateral toenails, L>R Gen NAD   Assessment/Plan: 1. Functional deficits secondary to deconditioning distal sigmoid malignancy , small embolic Left MCA infarct  which require 3+ hours per day of interdisciplinary therapy in a comprehensive inpatient rehab setting. Physiatrist is providing close team supervision and 24 hour management of active medical problems listed below. Physiatrist and rehab team continue to assess barriers to discharge/monitor patient progress toward functional and medical goals. FIM:                   Function - Comprehension Comprehension: Auditory Comprehension assist level: Understands basic 90% of the time/cues < 10% of the time  Function - Expression Expression: Verbal Expression assist level: Expresses basic 75  - 89% of the time/requires cueing 10 - 24% of the time. Needs helper to occlude trach/needs to repeat words.  Function - Social Interaction Social Interaction assist level: Interacts appropriately 75 - 89% of the time - Needs redirection for appropriate language or to initiate interaction.  Function - Problem Solving Problem solving assist level: Solves basic 50 - 74% of the time/requires cueing 25 - 49% of the time  Function - Memory Memory assist level: Recognizes or recalls 90% of the time/requires cueing < 10% of the time Patient normally able to recall (first 3 days only): Current season, Staff names and faces, That he or she is in a hospital  Medical Problem List and Plan: 1. Decreased functional mobility debilitation/encephalopathysecondary to colon cancer status post colectomy with creation of descending colostomy 02/07/2017/patchy acute small left MCA and MCA/ACA territory infarcts likely embolic Start PT, OT today 2. DVT Prophylaxis/Anticoagulation: Eliquis 3. Pain Management: Tylenol as needed 4. Mood: Provide emotional support 5. Neuropsych: This patient iscapable of making decisions on hisown behalf. 6. Skin/Wound Care: Routine skin checks, wound care RN to follow has generous amt serosan, photo in media from 8/23, wound care RN and Gen surg to follow 7. Fluids/Electrolytes/Nutrition: Routine I&O's will follow-up chemistry 8.ABLA. Follow-up CBC 9.Hypertension. Coreg 3.125 mg twice a day, Lasix 40 mg daily.controlled Vitals:   02/14/17 1601 02/15/17 0513  BP: 125/74 122/70  Pulse: 87 80  Resp: 18 16  Temp: 98.1 F (36.7 C) 98.2 F (36.8 C)  SpO2: 93% 94%   10.CAD/CABG/AVR 2006. Follow-up cardiology services as needed.  Continue anticoagulation 11.Type 2 diabetes mellitus. Hemoglobin A1c 6.4.SSI.Check blood sugars before meals and at bedtime 12.Hyperlipidemia. Lipitor  13. Incidental finding meningioma follow-up with neurology as outpatient  LOS (Days) 1 A  FACE TO [place] Grant E 02/15/2017, 6:11 AM

## 2017-02-15 NOTE — Consult Note (Addendum)
WOC followup: WOC consult requested for abd wound and colostomy.  Surgical team is following for assessment and plan of care for abd wound; refer to their notes from 8/23. Colostomy was changed yesterday and supplies are at the bedside for the bedside nurses, refer to previous [place] consult note; I will continue to follow for weekly teaching sessions until discharge. Julien Girt MSN, RN, [place], [place], [place]

## 2017-02-15 NOTE — Evaluation (Signed)
Occupational Therapy Assessment and Plan  Patient Details  Name: Terry Wu MRN: [id number] Date of Birth: 12/28/1931  OT Diagnosis: cognitive deficits, muscle weakness (generalized) and decreased coordination Rehab Potential: Rehab Potential (ACUTE ONLY): Good ELOS: 10-14   Today's Date: 02/15/2017 OT Individual Time: 409-811     91 min  Problem List:  Patient Active Problem List   Diagnosis Date Noted  . Left middle cerebral artery stroke ([place]) 02/14/2017  . Debility 02/14/2017  . Cerebral embolism with cerebral infarction 02/10/2017  . Colostomy in place [place])   . Persistent atrial fibrillation ([place])   . Acute respiratory failure ([place])   . Acute respiratory failure with hypoxia ([place])   . S/P abdominal surgery, follow-up exam   . Preoperative cardiovascular examination   . Chronic diastolic heart failure ([place]) 02/01/2017  . Dehydration 02/01/2017  . Acute kidney injury ([place]) 02/01/2017  . Diarrhea 01/30/2017  . Gait difficulty 08/22/2015  . Dizziness 08/22/2015  . Chronic atrial fibrillation ([place]) 08/22/2015  . Aortic aneurysm ([place]) 02/01/2014  . CAP (community acquired pneumonia) 02/12/2013  . Hyponatremia 02/12/2013  . COPD (chronic obstructive pulmonary disease) ([place]) 02/12/2013  . Bilateral leg edema 02/12/2013  . DIZZINESS 01/26/2010  . SHORTNESS OF BREATH 10/29/2008  . Diabetes type 2, controlled ([place]) 07/23/2008  . Mixed hyperlipidemia 07/23/2008  . HYPERTENSION, BENIGN 07/23/2008  . ESOPHAGEAL REFLUX 07/23/2008  . S/P AVR 07/23/2008  . CORONARY ARTERY BYPASS GRAFT, HX OF 07/23/2008    Past Medical History:  Past Medical History:  Diagnosis Date  . Aortic aneurysm (HCC)    Ascending thoracic - 4.8 cm by CT 2017  . Aortic regurgitation    Bioprosthetic AVR 2006  . Atrial fibrillation with controlled ventricular response ([place]) 08/22/2015   CHADS2VASC=5, on Coumadin  . CAD (coronary artery disease)    Multivessel status post CABG 2006  . Complication  of anesthesia    demerol per wife  . Esophageal reflux   . Essential hypertension   . History of dizziness   . Mixed hyperlipidemia   . Type 2 diabetes mellitus ([place])    Past Surgical History:  Past Surgical History:  Procedure Laterality Date  . AORTIC VALVE REPLACEMENT  2006   [place] Pericardial tissue valve  . COLON RESECTION SIGMOID N/A 02/07/2017   Procedure: SIGMOID COLECTOMY WITH COLOSTOMY;  Surgeon: Donnie Mesa, MD;  Location: [facility];  Service: General;  Laterality: N/A;  . COLONOSCOPY N/A 02/05/2017   Procedure: COLONOSCOPY;  Surgeon: Rogene Houston, MD;  [facility];  Service: Endoscopy;  Laterality: N/A;  . CORONARY ARTERY BYPASS GRAFT  2006   LIMA to LAD, SVG to diagonal, SVG to OM, SVG to PDA   . HERNIA REPAIR    . TOTAL KNEE ARTHROPLASTY Bilateral   . UMBILICAL HERNIA REPAIR N/A 02/07/2017   Procedure: HERNIA REPAIR UMBILICAL ADULT;  Surgeon: Donnie Mesa, MD;  Location: [facility];  Service: General;  Laterality: N/A;    Assessment & Plan Clinical Impression: Terry Wu is a 83 y.o. right handed male with history of aortic aneurysm, atrial fibrillation, CAD with CABG/AVR 2006  maintained on chronic Coumadin, hypertension , type 2 diabetes mellitus and history of prostate cancer status post radioactive seed implant. Per chart review patient lives with spouse. Independent prior to admission and still driving up until recently. Presented 01/30/2017 with diarrhea 2 weeks and increasing shortness of breath with ambulation. He denied any abdominal pain or rectal bleeding. CT of abdomen and pelvis showed no  acute intra-abdominal or intrapelvic process identified. Clostridium difficile specimen negative. Underwent colonoscopy 02/05/2017 per gastroenterology that showed proximal sigmoid colon, mid sigmoid colon and descending colon to be normal. There was a malignant tumor in the distal sigmoid colon that was biopsied. Later underwent sigmoid colectomy with creation of  descending colostomy after new diagnosis of adenocarcinoma per biopsy 02/07/2017 per general surgery. Patient was extubated 02/09/2007. MRI of the brain due to postoperative confusion and word finding difficulty showed patchy acute small left MCA territory infarcts. 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 MRA with no emergent large vessel occlusion. Carotid Dopplers had no ICA stenosis. His chronic Coumadin currently is on hold and was transitioned to Eliquis per follow-up of cardiology services. Acute blood loss anemia 9.1 and monitored. Tolerating a mechanical soft diet. Physical  and Occupational therapy evaluation completed with recommendations of physical medicine rehabilitation consult. Patient was admitted for a comprehensive rehabilitation program.    Patient currently requires mod with basic self-care skills secondary to muscle weakness, decreased cardiorespiratoy endurance, decreased coordination, decreased visual motor skills, decreased attention to left and decreased attention, decreased awareness, decreased problem solving, decreased safety awareness and delayed processing.  Prior to hospitalization, patient could complete BADLs with supervision overall and occasional A for footwear from wife.  Patient will benefit from skilled intervention to decrease level of assist with basic self-care skills prior to discharge home with care partner.  Anticipate patient will require 24 hour supervision and follow up home health.  OT - End of Session Endurance Deficit: Yes Endurance Deficit Description: tolerates ~5-10 min of activty with seated rest breaks OT Assessment Rehab Potential (ACUTE ONLY): Good OT Barriers to Discharge: Inaccessible home environment;Pending chemo/radiation OT Barriers to Discharge Comments: split level home layout; new cancer diagnosis/treatment OT Patient demonstrates impairments in the following area(s): Balance;Cognition;Endurance;Motor;Pain;Perception;Safety;Vision OT Basic  ADL's Functional Problem(s): Grooming;Bathing;Dressing;Toileting OT Transfers Functional Problem(s): Toilet;Tub/Shower OT Plan OT Intensity: Minimum of 1-2 x/day, 45 to 90 minutes OT Frequency: 5 out of 7 days OT Duration/Estimated Length of Stay: 10-14 OT Treatment/Interventions: Balance/vestibular training;Cognitive remediation/compensation;Community reintegration;Discharge planning;Disease mangement/prevention;DME/adaptive equipment instruction;Functional mobility training;Neuromuscular re-education;Patient/family education;Psychosocial support;Self Care/advanced ADL retraining;Therapeutic Activities;UE/LE Coordination activities;Visual/perceptual remediation/compensation;Therapeutic Exercise;UE/LE Strength taining/ROM OT Self Feeding Anticipated Outcome(s): MOD I OT Basic Self-Care Anticipated Outcome(s): MOD I OT Toileting Anticipated Outcome(s): S- bathing; MOD I- toileting OT Bathroom Transfers Anticipated Outcome(s): S-bathing; MOD I toileting OT Recommendation Recommendations for Other Services: Therapeutic Recreation consult Patient destination: Home Follow Up Recommendations: Home health OT Equipment Recommended: 3 in 1 bedside comode;To be determined Equipment Details: has shower chair   Skilled Therapeutic Intervention 1:1. RN present at beginning of session requesting A to change brief. Pt rolls B with MIN A wit instructional cueing. Pt stand pivot transfer with MOD A for lifting with Vc fo sequencing and safety awareness for RW use. Pt bathes at sit to stand level with A for washing buttocks, back, and B lower legs. Pt requires VC for locating items on L side of sink. Pt dons pull over shirt with A to pull shirt down back. Pt requires A for threading B feet/don socks into pant legs and stedying A to advance pants past hips. Pt stands at sink with MOD A for sit to stand and steadying A for balance as pt completes oral/hair care. Exited session with pt seated in w/c with call light  in reach and all needs met.  OT Evaluation Precautions/Restrictions  Precautions Precautions: Fall Restrictions Weight Bearing Restrictions: No General   Vital Signs   Pain Pain Assessment Pain  Assessment: No/denies pain Home Living/Prior Functioning Home Living Family/patient expects to be discharged to:: Private residence Living Arrangements: Spouse/significant other, Children Available Help at Discharge: Family, Available 24 hours/day Type of Home: House Home Access: Level entry Home Layout: Multi-level Alternate Level Stairs-Number of Steps: 7 Alternate Level Stairs-Rails: Right Bathroom Shower/Tub: Holiday representative Accessibility: Yes  Lives With: Spouse, Daughter IADL History Bill Paying/Finance Responsibility: Secondary Current License: No Mode of Transportation: Family Occupation: Retired Electrical engineer) Leisure and Hobbies: used to play golf; likes baseball- Prior Function Level of Independence: Needs assistance with ADLs (wife helped putting footwear)  Able to Take Stairs?: Yes Vocation: Retired Comments: no driving for 6 months due to vision ADL   Vision Baseline Vision/History: Wears glasses;Glaucoma;Macular Degeneration Wears Glasses: Reading only Patient Visual Report: No change from baseline Vision Assessment?: Yes Eye Alignment: Within Functional Limits Ocular Range of Motion: Within Functional Limits Alignment/Gaze Preference: Within Defined Limits Tracking/Visual Pursuits: Decreased smoothness of horizontal tracking;Decreased smoothness of eye movement to RIGHT superior field Perception  Perception: Impaired (mild L inattention) Praxis Praxis: Intact Cognition Overall Cognitive Status: Impaired/Different from baseline Arousal/Alertness: Awake/alert Orientation Level: Person;Place;Situation Person: Oriented Place: Oriented Situation: Oriented Year: 2018 Month: August Day of Week: Correct Memory: Impaired Immediate Memory Recall:  Sock;Blue;Bed Memory Recall: Sock;Blue Memory Recall Sock: Without Cue Memory Recall Blue: With Cue Attention: Sustained Sustained Attention: Appears intact Problem Solving: Impaired Safety/Judgment: Impaired Sensation Sensation Light Touch: Appears Intact Proprioception: Appears Intact Coordination Gross Motor Movements are Fluid and Coordinated: No Fine Motor Movements are Fluid and Coordinated: No Motor  Motor Motor:  (generalized weakness) Mobility  Transfers Transfers: Sit to Stand Sit to Stand: 3: Mod assist  Trunk/Postural Assessment  Cervical Assessment Cervical Assessment: Within Functional Limits Thoracic Assessment Thoracic Assessment: Exceptions to [facility] (kyohisis) Lumbar Assessment Lumbar Assessment: Exceptions to [facility] (posterior pelvic tilt) Postural Control Postural Control: Within Functional Limits  Balance Balance Balance Assessed: Yes Static Sitting Balance Static Sitting - Balance Support: No upper extremity supported Static Sitting - Level of Assistance: 5: Stand by assistance Dynamic Standing Balance Dynamic Standing - Balance Support: Left upper extremity supported Dynamic Standing - Level of Assistance: 4: Min assist (MOD A sit to stand) Extremity/Trunk Assessment RUE Assessment RUE Assessment: Exceptions to [facility] (generalized weakness) LUE Assessment LUE Assessment: Exceptions to [facility] (generalized weakness)   See Function Navigator for Current Functional Status.   Refer to Care Plan for Long Term Goals  Recommendations for other services: None    Discharge Criteria: Patient will be discharged from OT if patient refuses treatment 3 consecutive times without medical reason, if treatment goals not met, if there is a change in medical status, if patient makes no progress towards goals or if patient is discharged from hospital.  The above assessment, treatment plan, treatment alternatives and goals were discussed and mutually agreed upon: by  patient  Tonny Branch 02/15/2017, 12:21 PM

## 2017-02-15 NOTE — Progress Notes (Signed)
   Subjective/Chief Complaint: No complaints   Objective: Vital signs in last 24 hours: Temp:  [98.1 F (36.7 C)-98.2 F (36.8 C)] 98.2 F (36.8 C) (08/24 0513) Pulse Rate:  [80-87] 80 (08/24 0513) Resp:  [16-18] 16 (08/24 0513) BP: (122-125)/(70-74) 122/70 (08/24 0513) SpO2:  [93 %-94 %] 94 % (08/24 0513) Weight:  [109.4 kg (241 lb 1.6 oz)-109.7 kg (241 lb 14.3 oz)] 109.7 kg (241 lb 14.3 oz) (08/24 0513) Last BM Date: 02/12/17  Intake/Output from previous day: 08/23 0701 - 08/24 0700 In: 480 [P.O.:480] Out: 1200 [Urine:1200] Intake/Output this shift: No intake/output data recorded.  GI: wound with erythema still after opening, some purulence and fluid, there is no real drainage now, bs present stoma working  Lab Results:   Recent Labs  02/14/17 0401 02/15/17 0546  WBC 9.3 7.9  HGB 8.7* 9.6*  HCT 27.4* 29.7*  PLT 201 222   BMET  Recent Labs  02/14/17 0401 02/15/17 0546  NA 136 136  K 4.1 3.9  CL 100* 98*  CO2 32 29  GLUCOSE 175* 158*  BUN 6 7  CREATININE 0.69 0.76  CALCIUM 8.2* 8.6*    Anti-infectives: Anti-infectives    None      Assessment/Plan: POD#8 S/p Hartmann's procedure and suture repair of umbilical hernia 4/69 Dr. Donnie Mesa - path showing T2N0 adenocarcinoma, recommend outpatient oncology follow up. - continue diet - wound may end up needing to be opened more but I probed today and nothing further, he does have cellulitis and I have started abx today VTE: SCD's, Eliquis Follow up: staple removal 8/30 @ [place] office, post-op F/U Dr. Georgette Dover 3-4 weeks.  [facility] 02/15/2017

## 2017-02-15 NOTE — Plan of Care (Signed)
Problem: RH SKIN INTEGRITY Goal: RH STG SKIN FREE OF INFECTION/BREAKDOWN Pt skin will remain free from infection or new skin breakdown while hospitalized Outcome: Not Progressing Pt being treated for abdominal wound infection. Started on ABT 02/15/17 with Unasyn

## 2017-02-15 NOTE — Progress Notes (Signed)
Social Work Assessment and Plan Social Work Assessment and Plan  Patient Details  Name: Terry Wu MRN: [id number] Date of Birth: 06/29/1931  Today's Date: 02/15/2017  Problem List:  Patient Active Problem List   Diagnosis Date Noted  . Left middle cerebral artery stroke ([place]) 02/14/2017  . Debility 02/14/2017  . Cerebral embolism with cerebral infarction 02/10/2017  . Colostomy in place [facility])   . Persistent atrial fibrillation ([place])   . Acute respiratory failure ([place])   . Acute respiratory failure with hypoxia ([place])   . S/P abdominal surgery, follow-up exam   . Preoperative cardiovascular examination   . Chronic diastolic heart failure ([place]) 02/01/2017  . Dehydration 02/01/2017  . Acute kidney injury ([place]) 02/01/2017  . Diarrhea 01/30/2017  . Gait difficulty 08/22/2015  . Dizziness 08/22/2015  . Chronic atrial fibrillation ([place]) 08/22/2015  . Aortic aneurysm ([place]) 02/01/2014  . CAP (community acquired pneumonia) 02/12/2013  . Hyponatremia 02/12/2013  . COPD (chronic obstructive pulmonary disease) ([place]) 02/12/2013  . Bilateral leg edema 02/12/2013  . DIZZINESS 01/26/2010  . SHORTNESS OF BREATH 10/29/2008  . Diabetes type 2, controlled ([place]) 07/23/2008  . Mixed hyperlipidemia 07/23/2008  . HYPERTENSION, BENIGN 07/23/2008  . ESOPHAGEAL REFLUX 07/23/2008  . S/P AVR 07/23/2008  . CORONARY ARTERY BYPASS GRAFT, HX OF 07/23/2008   Past Medical History:  Past Medical History:  Diagnosis Date  . Aortic aneurysm (HCC)    Ascending thoracic - 4.8 cm by CT 2017  . Aortic regurgitation    Bioprosthetic AVR 2006  . Atrial fibrillation with controlled ventricular response ([place]) 08/22/2015   CHADS2VASC=5, on Coumadin  . CAD (coronary artery disease)    Multivessel status post CABG 2006  . Complication of anesthesia    demerol per wife  . Esophageal reflux   . Essential hypertension   . History of dizziness   . Mixed hyperlipidemia   . Type 2 diabetes mellitus ([place])     Past Surgical History:  Past Surgical History:  Procedure Laterality Date  . AORTIC VALVE REPLACEMENT  2006   [place] Pericardial tissue valve  . COLON RESECTION SIGMOID N/A 02/07/2017   Procedure: SIGMOID COLECTOMY WITH COLOSTOMY;  Surgeon: Donnie Mesa, MD;  Location: [facility];  Service: General;  Laterality: N/A;  . COLONOSCOPY N/A 02/05/2017   Procedure: COLONOSCOPY;  Surgeon: Rogene Houston, MD;  [facility];  Service: Endoscopy;  Laterality: N/A;  . CORONARY ARTERY BYPASS GRAFT  2006   LIMA to LAD, SVG to diagonal, SVG to OM, SVG to PDA   . HERNIA REPAIR    . TOTAL KNEE ARTHROPLASTY Bilateral   . UMBILICAL HERNIA REPAIR N/A 02/07/2017   Procedure: HERNIA REPAIR UMBILICAL ADULT;  Surgeon: Donnie Mesa, MD;  Location: [facility];  Service: General;  Laterality: N/A;   Social History:  reports that he has never smoked. He has never used smokeless tobacco. He reports that he does not drink alcohol or use drugs.  Family / Support Systems Marital Status: Married Patient Roles: Spouse, Parent Spouse/Significant OtherRosendo Gros [phone]  [phone] Children: Rand-son [phone] in [place] Other Supports: Daughter in the home but disabled due to visual issues Anticipated Caregiver: Wife and daughter Ability/Limitations of Caregiver: Wife is 81 yo and in good health and daughter is disabled due to vision issues Caregiver Availability: 24/7 Family Dynamics: Close knit family who are there for one another and will help each other. Pt is usually the one helping others so this is hard for him. He does  not like to ask for help from others.   Social History Preferred language: English Religion:  Cultural Background: No issues Education: High School Read: Yes Write: Yes Employment Status: Retired Freight forwarder Issues: No issues Guardian/Conservator: None-according to MD pt is capable of making his own decisions while here.    Abuse/Neglect Physical Abuse: Denies Verbal Abuse: Denies Sexual Abuse: Denies Exploitation of patient/patient's resources: Denies Self-Neglect: Denies Possible abuse reported to:: Other (Comment)  Emotional Status Pt's affect, behavior adn adjustment status: Pt has always been independent and able to take care of himself, in the last six months he had to give up driving and now this. He hopes to be able to recover to do for himself his wife has enough on her, with their daughter and now him Recent Psychosocial Issues: Giving up driving 6 months ago and daughter's disability Pyschiatric History: No history feel pt would benefit from seeing neuro-psych while here due to upset with his haivng a colostomy bag and having to rely upon others. He is adjusting well to the rehab unit. Substance Abuse History: No issues  Patient / Family Perceptions, Expectations & Goals Pt/Family understanding of illness & functional limitations: Pt and wife can explain his surgeries and stroke as a result. Pt does talk with the MD and feels his questions have been answered and he is being heard. He reports his path report was negative and this was good news. He doesn't like the fact he has to go hoem with a colostomy bag. Premorbid pt/family roles/activities: Husband, father, retiree, church member, etc Anticipated changes in roles/activities/participation: resume Pt/family expectations/goals: Pt states: " I want to be able to take care of myself, by the time I leave here, I don't know about this bag though." Wife states: " I hope he can move on his own, I can't do that much physical assist."  [employer]: None Premorbid Home Care/DME Agencies: None Transportation available at discharge: Wife is the driver now, pt and daughter can not due to visual issues Resource referrals recommended: Neuropsychology, Support group (specify)  Discharge Planning Living Arrangements:  Spouse/significant other, Children Support Systems: Spouse/significant other, Children, Friends/neighbors, Church/faith community Type of Residence: Private residence Insurance Resources: [employer], Multimedia programmer (specify) Web designer) Financial Resources: Radio broadcast assistant Screen Referred: No Living Expenses: Own Money Management: Patient, Spouse Does the patient have any problems obtaining your medications?: No Home Management: Wife and daughter Patient/Family Preliminary Plans: Return home with wife and daughter Blenda Peals him. Both are willing to learn about his colostomy, pt may not be able to see enough to do this. Will await therapy team evaluations and work on discharge needs.  Social Work Anticipated Follow Up Needs: HH/OP, Support Group  Clinical Impression Pleasant gentleman who has always been independent and in the last six months had to give up driving and now this. He is feeling a little down and this worker feels he would benefit from seeing neuro-psych while here. His wife and daughter are supportive and willing to learn and assist with what they can for him. Will await team evaluations and work on a safe discharge plan for him.  Elease Hashimoto 02/15/2017, 11:16 AM

## 2017-02-16 ENCOUNTER — Inpatient Hospital Stay (HOSPITAL_COMMUNITY): Payer: Medicare Other | Admitting: Physical Therapy

## 2017-02-16 ENCOUNTER — Inpatient Hospital Stay (HOSPITAL_COMMUNITY): Payer: Medicare Other

## 2017-02-16 ENCOUNTER — Inpatient Hospital Stay (HOSPITAL_COMMUNITY): Payer: Medicare Other | Admitting: Speech Pathology

## 2017-02-16 LAB — GLUCOSE, CAPILLARY
GLUCOSE-CAPILLARY: 162 mg/dL — AB (ref 65–99)
GLUCOSE-CAPILLARY: 173 mg/dL — AB (ref 65–99)
GLUCOSE-CAPILLARY: 169 mg/dL — AB (ref 65–99)
Glucose-Capillary: 196 mg/dL — ABNORMAL HIGH (ref 65–99)

## 2017-02-16 NOTE — Progress Notes (Signed)
Physical Therapy Session Note  Patient Details  Name: Terry Wu MRN: [id number] Date of Birth: 20-Dec-1931  Today's Date: 02/16/2017 PT Individual Time: 8032-1224 PT Individual Time Calculation (min): 75 min   Short Term Goals: Week 1:  PT Short Term Goal 1 (Week 1): Pt will ambulate 8' with LRAD and min assist PT Short Term Goal 2 (Week 1): Pt will initiate stair negotiation with PT PT Short Term Goal 3 (Week 1): Pt will complete BERG Balance Scale  Skilled Therapeutic Interventions/Progress Updates:  No c/o pain but pt very drowsy initially, falling asleep easily requiring cues to stay awake.  With increased time pt able to maintain arousal to participate.  Session focus on activity tolerance and increased independence with ADL tasks and therapeutic exercise.  Requires frequent rest breaks throughout session due to fatigue.   PT provided total assist for colostomy bag management.  Set up assist for teeth brushing at sink, set up assist for upper body bathing, total assist for lower body bathing and hair brushing.  Pt requires min assist for upper body dressing from w/c level, and max assist for lower body dressing from w/c level.  Pt states he cannot lean forward to wash legs, or for clothing management.  States his wife or daughter does this for him.    PT instructs pt in 2x10 reps UE therex with 4# weight, shoulder press, diagonals, chest press, and bicep curls with rest breaks as needed.  Pt negotiates 5 steps (3") with 2 rails and steady assist for balance (forward ascent, backward descent).  Pt returned to room at end of session and positioned in w/c with call bell in reach and RN present.       Therapy Documentation Precautions:  Precautions Precautions: Fall Precaution Comments: deconditioning Restrictions Weight Bearing Restrictions: No   See Function Navigator for Current Functional Status.   Therapy/Group: Individual Therapy  Michel Santee 02/16/2017, 9:31 AM

## 2017-02-16 NOTE — Progress Notes (Signed)
Speech Language Pathology Daily Session Note  Patient Details  Name: Terry Wu MRN: [id number] Date of Birth: Nov 30, 1931  Today's Date: 02/16/2017 SLP Individual Time: 1000-1100 SLP Individual Time Calculation (min): 60 min  Short Term Goals: Week 1: SLP Short Term Goal 1 (Week 1): Patient will consume current diet with minimal overt s/s of aspiration with mod I for use of swallowing compensatory strategies. SLP Short Term Goal 2 (Week 1): Patient will demonstrate efficient mastication with complete oral clearance of regular textures without overt s/s of aspiration over 2 sessions prior to upgrade.  SLP Short Term Goal 3 (Week 1): Patient will self-monitor and correct verbal errors at the sentence level with Min A verbal cues.  SLP Short Term Goal 4 (Week 1): Patient will demonstrate functional problem solving for basic and familiar tasks with Min A verbal cues.  SLP Short Term Goal 5 (Week 1): Patient will recall new, daily information with Min A verbal and visual cues.  SLP Short Term Goal 6 (Week 1): Patient will self-monitor and correct errors during functional tasks with Min A verbal cues.   Skilled Therapeutic Interventions: Skilled treatment session focused on dysphagia and cognition goals. SLP facilitated session by providing skilled observation of pt consuming mixed diced fruit, peanut butter graham crackers and coffee. Pt consumed without any cues for use of compensatory swallow strategies and no overt s/s of aspiration. Pt with timely oral prep and active manipulation of bolus with no increase in WOB. SLP further facilitated session by providing Min A to supervision cues for peg design task. Pt with questions that indicated he was self-monitoring his ability to replicate design but he was unable to self-correct. Pt able to recall previous information from morning session with questions cues. Pt was returned to room, left upright in wheelchair with all needs within reach. Continue per  current plan of care.       Function:  Eating Eating   Modified Consistency Diet: No Eating Assist Level: Swallowing techniques: self managed;Set up assist for   Eating Set Up Assist For: Opening containers       Cognition Comprehension Comprehension assist level: Understands basic 90% of the time/cues < 10% of the time  Expression   Expression assist level: Expresses basic needs/ideas: With no assist;Expresses basic needs/ideas: With extra time/assistive device  Social Interaction Social Interaction assist level: Interacts appropriately 75 - 89% of the time - Needs redirection for appropriate language or to initiate interaction.  Problem Solving Problem solving assist level: Solves basic 75 - 89% of the time/requires cueing 10 - 24% of the time  Memory Memory assist level: Recognizes or recalls 75 - 89% of the time/requires cueing 10 - 24% of the time    Pain Pain Assessment Pain Assessment: No/denies pain  Therapy/Group: Individual Therapy  Marcena Dias 02/16/2017, 10:47 AM

## 2017-02-16 NOTE — Progress Notes (Signed)
Physical Therapy Session Note  Patient Details  Name: Terry Wu MRN: [id number] Date of Birth: 01/07/1932  Today's Date: 02/16/2017 PT Individual Time: 1415-1510 PT Individual Time Calculation (min): 55 min   Short Term Goals: Week 1:  PT Short Term Goal 1 (Week 1): Pt will ambulate 50' with LRAD and min assist PT Short Term Goal 2 (Week 1): Pt will initiate stair negotiation with PT PT Short Term Goal 3 (Week 1): Pt will complete BERG Balance Scale  Skilled Therapeutic Interventions/Progress Updates:    no c/o pain, but reports fatigue.  States he did not get much rest after last PT session.  Session focus on activity tolerance and functional mobility.    Pt noted to be incontinent of urine on PT arrival.  Sit>stand for clothing management (mod assist for clothing) and pt with second episode of urinary incontinence in standing.  Pt returned to sitting and total assist for lower body clothing management and redressing.  Sit>stand to pull pants over hips with min assist for balance.    Nustep 2x5 minutes at level 2 for cardiovascular endurance, strengthening, and NMR for reciprocal stepping pattern retraining and trunk rotation.  Gait training x60' +55' with RW and seated rest break in between.  Pt requires min verbal cues for upright posture and encouragement to continue.    Pt requires max cues throughout session to continue to participate.  Continues to c/o fatigue and requesting to return to bed throughout entirety of session.  Returned to bed at end of session, mod assist to lift LEs into bed.  Pt becomes frustrated with therapist attempts to position pt so that he can scoot to HOB without assist, stating "why don't you just pull me up already!"  Pt left positioned to comfort with call bell in reach and needs met.   Therapy Documentation Precautions:  Precautions Precautions: Fall Precaution Comments: deconditioning Restrictions Weight Bearing Restrictions: No   See Function  Navigator for Current Functional Status.   Therapy/Group: Individual Therapy   E  02/16/2017, 3:16 PM  

## 2017-02-16 NOTE — Plan of Care (Signed)
Problem: RH SKIN INTEGRITY Goal: RH STG SKIN FREE OF INFECTION/BREAKDOWN Pt skin will remain free from infection or new skin breakdown while hospitalized  Outcome: Not Progressing Being treated at present for abdominal wound infection.

## 2017-02-16 NOTE — Progress Notes (Signed)
Terry Wu is a 83 y.o. male 08/14/1931 [id number]  Subjective: No new complaints. No new problems. Slept well. Feeling OK.  Objective: Vital signs in last 24 hours: Temp:  [98.4 F (36.9 C)-98.6 F (37 C)] 98.6 F (37 C) (08/25 0552) Pulse Rate:  [82-91] 91 (08/25 0552) Resp:  [18] 18 (08/25 0552) BP: (107-134)/(58-70) 134/70 (08/25 0552) SpO2:  [95 %-97 %] 95 % (08/25 0552) Weight:  [237 lb (107.5 kg)] 237 lb (107.5 kg) (08/25 0552) Weight change: -4 lb 1.6 oz (-1.86 kg) Last BM Date: 02/16/17  Intake/Output from previous day: 08/24 0701 - 08/25 0700 In: 1400 [P.O.:1200; IV Piggyback:200] Out: -  Last cbgs: CBG (last 3)   Recent Labs  02/15/17 1637 02/15/17 2043 02/16/17 0651  GLUCAP 166* 179* 162*     Physical Exam General: No apparent distress   HEENT: not dry Lungs: Normal effort. Lungs clear to auscultation, no crackles or wheezes. Cardiovascular: Regular rate and rhythm, no edema Abdomen: S/NT/ND; BS(+) Musculoskeletal:  unchanged Neurological: No new neurological deficits Wounds: N/A    Skin: clear  Aging changes Mental state: Alert, oriented, cooperative    Lab Results: BMET    Component Value Date/Time   NA 136 02/15/2017 0546   K 3.9 02/15/2017 0546   CL 98 (L) 02/15/2017 0546   CO2 29 02/15/2017 0546   GLUCOSE 158 (H) 02/15/2017 0546   BUN 7 02/15/2017 0546   CREATININE 0.76 02/15/2017 0546   CREATININE 0.97 09/22/2015 1139   CALCIUM 8.6 (L) 02/15/2017 0546   GFRNONAA >60 02/15/2017 0546   GFRAA >60 02/15/2017 0546   CBC    Component Value Date/Time   WBC 7.9 02/15/2017 0546   RBC 3.10 (L) 02/15/2017 0546   HGB 9.6 (L) 02/15/2017 0546   HCT 29.7 (L) 02/15/2017 0546   PLT 222 02/15/2017 0546   MCV 95.8 02/15/2017 0546   MCH 31.0 02/15/2017 0546   MCHC 32.3 02/15/2017 0546   RDW 13.8 02/15/2017 0546   LYMPHSABS 1.4 02/15/2017 0546   MONOABS 0.6 02/15/2017 0546   EOSABS 0.3 02/15/2017 0546   BASOSABS 0.0 02/15/2017 0546     Studies/Results: No results found.  Medications: I have reviewed the patient's current medications.  Assessment/Plan:   1. CVA L MCA - CIR 2. Colon ca - s/p colectomy and colostomy on 02/07/17 2. DVT proph - Eliquis 3. ABLA - monitor CBC 4. HTN - Coreg 5. CAD 6. Dyslipidemia - Lipitor 7. DM2   Length of stay, days: 2  Walker Kehr , MD 02/16/2017, 11:53 AM

## 2017-02-16 NOTE — Progress Notes (Signed)
    CC:  Post op wound check  Subjective: Now in rehab.  Says the therapies are difficult.  Wound OK, picture below.  Ostomy working well and his is tolerating diet.  Objective: Vital signs in last 24 hours: Temp:  [98.4 F (36.9 C)-98.6 F (37 C)] 98.6 F (37 C) (08/25 0552) Pulse Rate:  [82-91] 91 (08/25 0552) Resp:  [18] 18 (08/25 0552) BP: (107-134)/(58-70) 134/70 (08/25 0552) SpO2:  [95 %-97 %] 95 % (08/25 0552) Weight:  [107.5 kg (237 lb)] 107.5 kg (237 lb) (08/25 0552) Last BM Date: 02/16/17 1200 PO 200 IV Void x 6 Afebrile, VSS NO labs/film Intake/Output from previous day: 08/24 0701 - 08/25 0700 In: 1400 [P.O.:1200; IV Piggyback:200] Out: -  Intake/Output this shift: Total I/O In: 120 [P.O.:120] Out: -   General appearance: alert, cooperative and no distress GI: soft sore,site noted below.  Picture below   02/14/17 picture   Picture today 02/16/17      Lab Results:   Recent Labs  02/14/17 0401 02/15/17 0546  WBC 9.3 7.9  HGB 8.7* 9.6*  HCT 27.4* 29.7*  PLT 201 222    BMET  Recent Labs  02/14/17 0401 02/15/17 0546  NA 136 136  K 4.1 3.9  CL 100* 98*  CO2 32 29  GLUCOSE 175* 158*  BUN 6 7  CREATININE 0.69 0.76  CALCIUM 8.2* 8.6*   PT/INR No results for input(s): LABPROT, INR in the last 72 hours.   Recent Labs Lab 02/11/17 0211 02/12/17 0307 02/15/17 0546  AST 13* 13* 26  ALT 17 17 25   ALKPHOS 24* 25* 26*  BILITOT 1.0 0.8 1.0  PROT 5.4* 5.3* 5.8*  ALBUMIN 2.4* 2.3* 2.5*     Lipase  No results found for: LIPASE   Medications: . apixaban  5 mg Oral BID  . atorvastatin  20 mg Oral q1800  . carvedilol  3.125 mg Oral BID WC  . dorzolamide-timolol  1 drop Both Eyes BID  . furosemide  40 mg Oral Daily  . insulin aspart  0-15 Units Subcutaneous TID WC  . insulin aspart  0-5 Units Subcutaneous QHS  . pantoprazole  40 mg Oral Q1200  . polyethylene glycol  17 g Oral Daily   . ampicillin-sulbactam (UNASYN) IV 3 g  (02/16/17 1144)   Anti-infectives    Start     Dose/Rate Route Frequency Ordered Stop   02/15/17 1100  Ampicillin-Sulbactam (UNASYN) 3 g in sodium chloride 0.9 % 100 mL IVPB     3 g 200 mL/hr over 30 Minutes Intravenous Every 6 hours 02/15/17 0958       . ampicillin-sulbactam (UNASYN) IV 3 g (02/16/17 1144)     Assessment/Plan  1. CVA L MCA - CIR 2. Colon ca - s/p colectomy and colostomy on 02/07/17 - T2N0 adenocarcinoma - Dr. Donnie Mesa 2. DVT proph - Eliquis 3. ABLA - monitor CBC 4. HTN - Coreg 5. CAD 6. Dyslipidemia - Lipitor 7. DM2 FEN: D3 ID:  Unasyn 8/24 DVT: apixaban   Plan:  He is up in the chair, doing well.  We will watch the wound but it seems to be cleaning up with this portion open.         LOS: 2 days    Terry Wu 02/16/2017 [phone]

## 2017-02-17 ENCOUNTER — Inpatient Hospital Stay (HOSPITAL_COMMUNITY): Payer: Medicare Other

## 2017-02-17 DIAGNOSIS — I158 Other secondary hypertension: Secondary | ICD-10-CM

## 2017-02-17 LAB — GLUCOSE, CAPILLARY
GLUCOSE-CAPILLARY: 167 mg/dL — AB (ref 65–99)
GLUCOSE-CAPILLARY: 157 mg/dL — AB (ref 65–99)
Glucose-Capillary: 142 mg/dL — ABNORMAL HIGH (ref 65–99)
Glucose-Capillary: 160 mg/dL — ABNORMAL HIGH (ref 65–99)

## 2017-02-17 NOTE — Plan of Care (Signed)
Problem: RH KNOWLEDGE DEFICIT Goal: RH STG INCREASE KNOWLEDGE OF DIABETES Pt will know how to better manage diabetes and monitor blood sugars

## 2017-02-17 NOTE — Progress Notes (Signed)
Terry Wu is a 83 y.o. male [birth year] [id number]  Subjective: No new complaints. No new problems. Slept well. Feeling OK.  Objective: Vital signs in last 24 hours: Temp:  [98.3 F (36.8 C)-98.4 F (36.9 C)] 98.4 F (36.9 C) (08/26 0547) Pulse Rate:  [90-96] 96 (08/26 0547) Resp:  [18] 18 (08/26 0547) BP: (128-132)/(77-80) 128/77 (08/26 0547) SpO2:  [91 %-94 %] 91 % (08/26 0547) Weight:  [244 lb (110.7 kg)] 244 lb (110.7 kg) (08/26 0547) Weight change: 7 lb (3.175 kg) Last BM Date: 02/16/17  Intake/Output from previous day: 08/25 0701 - 08/26 0700 In: 53 [P.O.:480; IV Piggyback:100] Out: -  Last cbgs: CBG (last 3)   Recent Labs  02/16/17 1649 02/16/17 2137 02/17/17 0646  GLUCAP 196* 169* 142*     Physical Exam General: No apparent distress  Watching [employer] HEENT: not dry Lungs: Normal effort. Lungs clear to auscultation, no crackles or wheezes. Cardiovascular: Regular rate and rhythm, no edema Abdomen: S/NT/ND; BS(+) Musculoskeletal:  unchanged Neurological: No new neurological deficits Wounds: colostomy is full   Skin: clear  Aging changes Mental state: Alert, oriented, cooperative    Lab Results: BMET    Component Value Date/Time   NA 136 02/15/2017 0546   K 3.9 02/15/2017 0546   CL 98 (L) 02/15/2017 0546   CO2 29 02/15/2017 0546   GLUCOSE 158 (H) 02/15/2017 0546   BUN 7 02/15/2017 0546   CREATININE 0.76 02/15/2017 0546   CREATININE 0.97 09/22/2015 1139   CALCIUM 8.6 (L) 02/15/2017 0546   GFRNONAA >60 02/15/2017 0546   GFRAA >60 02/15/2017 0546   CBC    Component Value Date/Time   WBC 7.9 02/15/2017 0546   RBC 3.10 (L) 02/15/2017 0546   HGB 9.6 (L) 02/15/2017 0546   HCT 29.7 (L) 02/15/2017 0546   PLT 222 02/15/2017 0546   MCV 95.8 02/15/2017 0546   MCH 31.0 02/15/2017 0546   MCHC 32.3 02/15/2017 0546   RDW 13.8 02/15/2017 0546   LYMPHSABS 1.4 02/15/2017 0546   MONOABS 0.6 02/15/2017 0546   EOSABS 0.3 02/15/2017 0546   BASOSABS 0.0  02/15/2017 0546    Studies/Results: No results found.  Medications: I have reviewed the patient's current medications.  Assessment/Plan:  1. CVA L MCA - cont w/CIR 2. Colon ca - s/p colectomy and colostomy on 02/07/17. Doing well. 2. DVT proph - cont w/po Eliquis 3. ABLA - monitor CBC.  Lab Results  Component Value Date   HGB 9.6 (L) 02/15/2017    4. HTN - on po Coreg 5. CAD - Coreg 6. Dyslipidemia - cont w/Lipitor 7. DM2 - monitoring glucose    Length of stay, days: 3  Walker Kehr , MD 02/17/2017, 9:32 AM

## 2017-02-17 NOTE — Progress Notes (Signed)
Occupational Therapy Session Note  Patient Details  Name: Terry Wu MRN: [id number] Date of Birth: 1931/09/01  Today's Date: 02/17/2017 OT Individual Time: 1643-5391 OT Individual Time Calculation (min): 55 min    Short Term Goals: Week 1:  OT Short Term Goal 1 (Week 1): Pt will stand at sink to groom wiht supervision OT Short Term Goal 2 (Week 1): Pt will thread BLE into pants using AE PRN OT Short Term Goal 3 (Week 1): Pt will sit to stnad wiht MIN A in prep for clohting managment OT Short Term Goal 4 (Week 1): Pt will complete walk in shower transfer with min A OT Short Term Goal 5 (Week 1): Pt will transfer to BSC/toilet wiht min A  Skilled Therapeutic Interventions/Progress Updates:    1;1. No pain reported. Wife and daughter presnt at beginning of session and engaged in discussion of role/purpose of OT, goals, and discharge planning. Family exited room. Pt reports brother passed away in surgering this morning and became tearful. Ot provided support and encouragement. Pt aggreeable to learning AE technique "just this once" as wife assists pt with footwear at home. OT demonstrates reacher/sock aide technique, and pt return demonstrates with VC for technique and min A to pull 2/2 sock aide getting caught on foam pads on heels. Pt uses reacher to thread theraband over feet and sit tos stand with mod A with RW to advance band past hips. Pt practices with paper scrub pants to thread B feet into pant legs with mod cueing to reacher technique and problem solving when toes are stuck on waistband. Pt completes ambulatory toilet transfer/toileting with min A for balance. PT completes 1x10 level 1 therapband UE exercises in all planes of motion with instructional cues and tactile cues to decrease compensatory movements. Exited session with call light in reach and all needs met.    Therapy Documentation Precautions:  Precautions Precautions: Fall Precaution Comments:  deconditioning Restrictions Weight Bearing Restrictions: No  See Function Navigator for Current Functional Status.   Therapy/Group: Individual Therapy  Tonny Branch 02/17/2017, 3:58 PM

## 2017-02-18 ENCOUNTER — Inpatient Hospital Stay (HOSPITAL_COMMUNITY): Payer: Medicare Other | Admitting: Speech Pathology

## 2017-02-18 ENCOUNTER — Encounter (HOSPITAL_COMMUNITY): Payer: Medicare Other

## 2017-02-18 ENCOUNTER — Inpatient Hospital Stay (HOSPITAL_COMMUNITY): Payer: Medicare Other | Admitting: Occupational Therapy

## 2017-02-18 ENCOUNTER — Inpatient Hospital Stay (HOSPITAL_COMMUNITY): Payer: Medicare Other | Admitting: Physical Therapy

## 2017-02-18 LAB — GLUCOSE, CAPILLARY
GLUCOSE-CAPILLARY: 176 mg/dL — AB (ref 65–99)
Glucose-Capillary: 168 mg/dL — ABNORMAL HIGH (ref 65–99)
Glucose-Capillary: 141 mg/dL — ABNORMAL HIGH (ref 65–99)
Glucose-Capillary: 198 mg/dL — ABNORMAL HIGH (ref 65–99)

## 2017-02-18 NOTE — Progress Notes (Signed)
Subjective/Complaints: Pt is upset about the death of his brother yesterday  ROS- no CP, SOB, N/V/D  Objective: Vital Signs: Blood pressure (!) 141/64, pulse 86, temperature 98.2 F (36.8 C), temperature source Oral, resp. rate 18, height 5\' 9"  (1.753 m), weight (!) 137 kg (302 lb), SpO2 96 %. No results found. Results for orders placed or performed during the hospital encounter of 02/14/17 (from the past 72 hour(s))  Glucose, capillary     Status: Abnormal   Collection Time: 02/15/17 11:53 AM  Result Value Ref Range   Glucose-Capillary 163 (H) 65 - 99 mg/dL  Glucose, capillary     Status: Abnormal   Collection Time: 02/15/17  4:37 PM  Result Value Ref Range   Glucose-Capillary 166 (H) 65 - 99 mg/dL  Glucose, capillary     Status: Abnormal   Collection Time: 02/15/17  8:43 PM  Result Value Ref Range   Glucose-Capillary 179 (H) 65 - 99 mg/dL  Glucose, capillary     Status: Abnormal   Collection Time: 02/16/17  6:51 AM  Result Value Ref Range   Glucose-Capillary 162 (H) 65 - 99 mg/dL  Glucose, capillary     Status: Abnormal   Collection Time: 02/16/17 11:55 AM  Result Value Ref Range   Glucose-Capillary 173 (H) 65 - 99 mg/dL  Glucose, capillary     Status: Abnormal   Collection Time: 02/16/17  4:49 PM  Result Value Ref Range   Glucose-Capillary 196 (H) 65 - 99 mg/dL  Glucose, capillary     Status: Abnormal   Collection Time: 02/16/17  9:37 PM  Result Value Ref Range   Glucose-Capillary 169 (H) 65 - 99 mg/dL  Glucose, capillary     Status: Abnormal   Collection Time: 02/17/17  6:46 AM  Result Value Ref Range   Glucose-Capillary 142 (H) 65 - 99 mg/dL  Glucose, capillary     Status: Abnormal   Collection Time: 02/17/17 11:51 AM  Result Value Ref Range   Glucose-Capillary 160 (H) 65 - 99 mg/dL  Glucose, capillary     Status: Abnormal   Collection Time: 02/17/17  4:47 PM  Result Value Ref Range   Glucose-Capillary 167 (H) 65 - 99 mg/dL  Glucose, capillary     Status:  Abnormal   Collection Time: 02/17/17 10:20 PM  Result Value Ref Range   Glucose-Capillary 157 (H) 65 - 99 mg/dL  Glucose, capillary     Status: Abnormal   Collection Time: 02/18/17  4:59 AM  Result Value Ref Range   Glucose-Capillary 168 (H) 65 - 99 mg/dL     HEENT: normal Cardio: RRR and no murmur Resp: CTA B/L and decreased BS at bases GI: BS positive and + colostomy and abd wound, BS positive and Non tender non distended Extremity: stasis dermatitis BLE Skin:   Wound midline abd wound with packing, RLQ colostomy Neuro: Flat and Abnormal Motor 4/5 BUE and BLE Musc/Skel:  Normal   Assessment/Plan: 1. Functional deficits secondary to debility which require 3+ hours per day of interdisciplinary therapy in a comprehensive inpatient rehab setting. Physiatrist is providing close team supervision and 24 hour management of active medical problems listed below. Physiatrist and rehab team continue to assess barriers to discharge/monitor patient progress toward functional and medical goals. FIM: Function - Bathing Position: Wheelchair/chair at sink Body parts bathed by patient: Right arm, Left arm, Chest, Abdomen, Front perineal area Body parts bathed by helper: Back, Left lower leg, Right lower leg, Left upper leg, Right upper leg  Bathing not applicable: Buttocks Assist Level: Touching or steadying assistance(Pt > 75%)  Function- Upper Body Dressing/Undressing What is the patient wearing?: Pull over shirt/dress Pull over shirt/dress - Perfomed by patient: Thread/unthread right sleeve, Thread/unthread left sleeve, Put head through opening Pull over shirt/dress - Perfomed by helper: Pull shirt over trunk Assist Level: Touching or steadying assistance(Pt > 75%) Function - Lower Body Dressing/Undressing What is the patient wearing?: Underwear, Pants, Non-skid slipper socks Position: Wheelchair/chair at sink Underwear - Performed by helper: Thread/unthread right underwear leg,  Thread/unthread left underwear leg, Pull underwear up/down Pants- Performed by patient: Pull pants up/down Pants- Performed by helper: Thread/unthread right pants leg, Thread/unthread left pants leg Non-skid slipper socks- Performed by helper: Don/doff right sock, Don/doff left sock Assist for footwear: Dependant Assist for lower body dressing: Touching or steadying assistance (Pt > 75%)  Function - Toileting Toileting activity did not occur: No continent bowel/bladder event Toileting steps completed by patient: Performs perineal hygiene, Adjust clothing prior to toileting, Adjust clothing after toileting Toileting steps completed by helper: Adjust clothing after toileting Toileting Assistive Devices: Grab bar or rail Assist level: Touching or steadying assistance (Pt.75%)  Function - Air cabin crew transfer assistive device: Elevated toilet seat/BSC over toilet, Grab bar Assist level to toilet: Moderate assist (Pt 50 - 74%/lift or lower) Assist level from toilet: Moderate assist (Pt 50 - 74%/lift or lower)  Function - Chair/bed transfer Chair/bed transfer method: Stand pivot Chair/bed transfer assist level: Touching or steadying assistance (Pt > 75%) Chair/bed transfer assistive device: Armrests, Walker Chair/bed transfer details: Verbal cues for technique, Verbal cues for precautions/safety, Verbal cues for safe use of DME/AE, Verbal cues for sequencing  Function - Locomotion: Wheelchair Will patient use wheelchair at discharge?: No Function - Locomotion: Ambulation Assistive device: Walker-rolling Max distance: 60 Assist level: Touching or steadying assistance (Pt > 75%) Assist level: Touching or steadying assistance (Pt > 75%) Walk 50 feet with 2 turns activity did not occur: Safety/medical concerns Assist level: Touching or steadying assistance (Pt > 75%) Walk 150 feet activity did not occur: Safety/medical concerns Walk 10 feet on uneven surfaces activity did not  occur: Safety/medical concerns  Function - Comprehension Comprehension: Auditory Comprehension assist level: Understands complex 90% of the time/cues 10% of the time  Function - Expression Expression: Verbal Expression assist level: Expresses complex ideas: With no assist  Function - Social Interaction Social Interaction assist level: Interacts appropriately 75 - 89% of the time - Needs redirection for appropriate language or to initiate interaction.  Function - Problem Solving Problem solving assist level: Solves basic 75 - 89% of the time/requires cueing 10 - 24% of the time  Function - Memory Memory assist level: Recognizes or recalls 75 - 89% of the time/requires cueing 10 - 24% of the time Patient normally able to recall (first 3 days only): Location of own room, Current season, Staff names and faces, That he or she is in a hospital  Medical Problem List and Plan: 1. 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 DVT Prophylaxis/Anticoagulation: Eliquis 3. Pain Management: Tylenol as needed 4. Mood: Provide emotional support, he does want to attend funeral of brother                  5. Neuropsych: This patient iscapable of making decisions on hisown behalf. 6. Skin/Wound Care:  Routine skin checks 7. Fluids/Electrolytes/Nutrition: Routine I&O's will follow-up chemistry 8.ABLA. Follow-up CBC 9.Hypertension. Coreg 3.125 mg twice a day, Lasix 40 mg daily. 10.CAD/CABG/AVR 2006. Follow-up cardiology services as needed. Continue anticoagulation 11.Type 2 diabetes mellitus. Hemoglobin A1c 6.4.SSI.Check blood sugars before meals and at bedtime 12.Hyperlipidemia. Lipitor  13. Incidental finding meningioma follow-up with neurology as outpatient  LOS (Days) 4 A FACE TO  Shelton E 02/18/2017, 7:23 AM

## 2017-02-18 NOTE — Progress Notes (Signed)
Physical Therapy Session Note  Patient Details  Name: Terry Wu MRN: [id number] Date of Birth: 02-23-1932  Today's Date: 02/18/2017 PT Individual Time: 0626-9485 PT Individual Time Calculation (min): 23 min   Short Term Goals: Week 1:  PT Short Term Goal 1 (Week 1): Pt will ambulate 11' with LRAD and min assist PT Short Term Goal 2 (Week 1): Pt will initiate stair negotiation with PT PT Short Term Goal 3 (Week 1): Pt will complete BERG Balance Scale  Skilled Therapeutic Interventions/Progress Updates:    Pt sleepy soundly in w/c on arrival, requires verbal and tactile cues to awaken.  No c/o pain.  Session focus on strengthening and activity tolerance.  PT instructed pt in 2x10 reps bicep curls, shoulder press, and trunk rotation with 2# ball.  Pt requires rest breaks between each exercise, with longer to recover each time.  Also noted increased work for expiration this session.  Pt transferred back to bed at end of session with min assist for stand/pivot and to lift RLE into bed.  Pt left supine with call bell in reach and needs met.   Therapy Documentation Precautions:  Precautions Precautions: Fall Precaution Comments: deconditioning Restrictions Weight Bearing Restrictions: No   See Function Navigator for Current Functional Status.   Therapy/Group: Individual Therapy  Michel Santee 02/18/2017, 3:54 PM

## 2017-02-18 NOTE — Progress Notes (Signed)
Physical Therapy Session Note  Patient Details  Name: Terry Wu MRN: [id number] Date of Birth: Sep 30, 1931  Today's Date: 02/18/2017 PT Individual Time: 1000-1055 PT Individual Time Calculation (min): 55 min   Short Term Goals: Week 1:  PT Short Term Goal 1 (Week 1): Pt will ambulate 65' with LRAD and min assist PT Short Term Goal 2 (Week 1): Pt will initiate stair negotiation with PT PT Short Term Goal 3 (Week 1): Pt will complete BERG Balance Scale  Skilled Therapeutic Interventions/Progress Updates:    no c/o pain, but reports feeling sad about brother passing away over the weekend.  Session focus on cognition and activity tolerance.    Pt taken outside for therapeutic change of scene and engaged in discussion regarding d/c planning.  W/C propulsion in controlled environment x60' with BUEs (short rest breaks as needed) for strengthening and overall activity tolerance.  Gait training [phone]' with RW and supervision with min verbal cues for hand placement with sit<>stand.  Pt requires seated rest breaks following each gait trial.  Noted pt to have increased WOB following gait trials and noted pt's UEs to be more swollen than when this therapist worked with pt on 8/25.  RN notified.  Pt returned to room at end of session and positioned in w/c, call bell in reach and needs met.   Therapy Documentation Precautions:  Precautions Precautions: Fall Precaution Comments: deconditioning Restrictions Weight Bearing Restrictions: No   See Function Navigator for Current Functional Status.   Therapy/Group: Individual Therapy  Michel Santee 02/18/2017, 11:11 AM

## 2017-02-18 NOTE — Progress Notes (Signed)
Pharmacy Antibiotic Note  Terry Wu is a 83 y.o. male with recent prolonged hospital stay since 8/8 with newly diagnosed colon cancer, s/p colectomy, acute stroke post surgery, transferred to [place] on 02/14/2017. He is on D#4 unasyn for wound infection. Afebrile, most recent scr 0.76 on 8/24, est. crcl ~ 90 ml/min. Wound still erythematous along the edges per surgery assessment  Plan: - Continue unasyn 3g IV Q 6 hrs - monitor renal function (BMET in AM) - f/u LOT  Height: 5\' 9"  (175.3 cm) Weight: (!) 302 lb (137 kg) IBW/kg (Calculated) : 70.7  Temp (24hrs), Avg:98.5 F (36.9 C), Min:98.2 F (36.8 C), Max:98.7 F (37.1 C)   Recent Labs Lab 02/12/17 0307 02/13/17 0430 02/14/17 0401 02/15/17 0546  WBC 7.0 8.4 9.3 7.9  CREATININE 0.76  --  0.69 0.76    Estimated Creatinine Clearance: 96.2 mL/min (by C-G formula based on SCr of 0.76 mg/dL).    Allergies  Allergen Reactions  . Aspirin     Unknown, per pt  . Demerol     Unknown, per pt    Antimicrobials this admission: Cefotetan 8/16 x1 (preop) Unasyn 8/24 >>   Microbiology results: 8/13 Urine >> negative 8/16 MRSA PCR >> negative  Thank you for allowing pharmacy to be a part of this patient's care.  Maryanna Shape, PharmD, BCPS  Clinical Pharmacist  Pager: [phone]   02/18/2017 2:47 PM

## 2017-02-18 NOTE — Progress Notes (Signed)
    CC:  Post op wound check  Subjective: Doing well, up in chair, sad this AM, his brother died over the weekend from a ruptured AAA.  Dressing changes without issue  Objective: Vital signs in last 24 hours: Temp:  [98.2 F (36.8 C)] 98.2 F (36.8 C) (08/27 0455) Pulse Rate:  [86-91] 86 (08/27 0455) Resp:  [18] 18 (08/27 0455) BP: (141-142)/(64-83) 141/64 (08/27 0455) SpO2:  [96 %] 96 % (08/27 0455) Weight:  [137 kg (302 lb)] 137 kg (302 lb) (08/27 0500) Last BM Date: 02/17/17 600 Po 500 stool Afebrile, VSS No labs No radiology No Micro-   Intake/Output from previous day: 08/26 0701 - 08/27 0700 In: 600 [P.O.:600] Out: 500 [Stool:500] Intake/Output this shift: No intake/output data recorded.  General appearance: alert, cooperative and no distress GI: up in the chair, large abdomen, colostomy working well.  Open portion of the wound is OK, remaining staples OK.  Lab Results:  No results for input(s): WBC, HGB, HCT, PLT in the last 72 hours.  BMET No results for input(s): NA, K, CL, CO2, GLUCOSE, BUN, CREATININE, CALCIUM in the last 72 hours. PT/INR No results for input(s): LABPROT, INR in the last 72 hours.   Recent Labs Lab 02/12/17 0307 02/15/17 0546  AST 13* 26  ALT 17 25  ALKPHOS 25* 26*  BILITOT 0.8 1.0  PROT 5.3* 5.8*  ALBUMIN 2.3* 2.5*     Lipase  No results found for: LIPASE   Medications: . apixaban  5 mg Oral BID  . atorvastatin  20 mg Oral q1800  . carvedilol  3.125 mg Oral BID WC  . dorzolamide-timolol  1 drop Both Eyes BID  . furosemide  40 mg Oral Daily  . insulin aspart  0-15 Units Subcutaneous TID WC  . insulin aspart  0-5 Units Subcutaneous QHS  . pantoprazole  40 mg Oral Q1200  . polyethylene glycol  17 g Oral Daily   Anti-infectives    Start     Dose/Rate Route Frequency Ordered Stop   02/15/17 1100  Ampicillin-Sulbactam (UNASYN) 3 g in sodium chloride 0.9 % 100 mL IVPB     3 g 200 mL/hr over 30 Minutes Intravenous Every  6 hours 02/15/17 0958        Assessment/Plan 1. CVA L MCA - CIR 2. Colon ca - s/p colectomy and colostomy on 02/07/17 - T2N0 adenocarcinoma - Dr. Donnie Mesa  POD 11 3.Partial open wound 4. DVT proph - Eliquis 5. ABLA - monitor CBC 6. HTN - Coreg 7. CAD 8. Dyslipidemia - Lipitor 9. DM2 FEN: D3 ID:  Unasyn 8/24 =>> day 4 DVT: apixaban   Plan:  Continue wet to dry dressings.  His brother died this weekend from an aneurysm.  He is feeling rather sad about that.  His staple should come out on 02/21/17.  We will continue to follow.  I am going to ask nursing to gently clean base of wound with wet 4 x 4 each dressing change.        LOS: 4 days    Bensyn Bornemann 02/18/[id number]

## 2017-02-18 NOTE — Progress Notes (Signed)
Speech Language Pathology Daily Session Note  Patient Details  Name: Terry Wu MRN: [id number] Date of Birth: [birth year]  Today's Date: 02/18/2017 SLP Individual Time: 1300-1400 SLP Individual Time Calculation (min): 60 min  Short Term Goals: Week 1: SLP Short Term Goal 1 (Week 1): Patient will consume current diet with minimal overt s/s of aspiration with mod I for use of swallowing compensatory strategies. SLP Short Term Goal 2 (Week 1): Patient will demonstrate efficient mastication with complete oral clearance of regular textures without overt s/s of aspiration over 2 sessions prior to upgrade.  SLP Short Term Goal 3 (Week 1): Patient will self-monitor and correct verbal errors at the sentence level with Min A verbal cues.  SLP Short Term Goal 4 (Week 1): Patient will demonstrate functional problem solving for basic and familiar tasks with Min A verbal cues.  SLP Short Term Goal 5 (Week 1): Patient will recall new, daily information with Min A verbal and visual cues.  SLP Short Term Goal 6 (Week 1): Patient will self-monitor and correct errors during functional tasks with Min A verbal cues.   Skilled Therapeutic Interventions: Skilled treatment session focused on dysphagia and cognitive goals. SLP facilitated session by administering trials of regular textures. Patient demonstrated efficient mastication with minimal oral residue that patient required supervision verbal cues to self-monitor and correct. Patient also demonstrated intermittent overt s/s of aspiration, suspect due to mixed consistencies. Recommend trial tray prior to upgrade. SLP also facilitated session by providing supervision verbal cues for problem solving during a basic money management task and Min A verbal cues during a basic verbal reasoning task with Mod I for word-finding. Patient left upright in wheelchair with all needs within reach. Continue with current plan of care.      Function:  Eating Eating   Modified  Consistency Diet: Yes Eating Assist Level: Supervision or verbal cues           Cognition Comprehension Comprehension assist level: Understands complex 90% of the time/cues 10% of the time  Expression   Expression assist level: Expresses complex ideas: With no assist  Social Interaction Social Interaction assist level: Interacts appropriately 75 - 89% of the time - Needs redirection for appropriate language or to initiate interaction.  Problem Solving Problem solving assist level: Solves basic 75 - 89% of the time/requires cueing 10 - 24% of the time  Memory Memory assist level: Recognizes or recalls 75 - 89% of the time/requires cueing 10 - 24% of the time    Pain Pain Assessment Pain Assessment: No/denies pain  Therapy/Group: Individual Therapy  Tashira Torre 02/18/2017, 3:36 PM

## 2017-02-18 NOTE — Progress Notes (Signed)
Occupational Therapy Session Note  Patient Details  Name: Terry Wu MRN: [id number] Date of Birth: Jul 23, 1931  Today's Date: 02/18/2017 OT Individual Time: 1638-4665 OT Individual Time Calculation (min): 60 min    Short Term Goals: Week 1:  OT Short Term Goal 1 (Week 1): Pt will stand at sink to groom wiht supervision OT Short Term Goal 2 (Week 1): Pt will thread BLE into pants using AE PRN OT Short Term Goal 3 (Week 1): Pt will sit to stnad wiht MIN A in prep for clohting managment OT Short Term Goal 4 (Week 1): Pt will complete walk in shower transfer with min A OT Short Term Goal 5 (Week 1): Pt will transfer to BSC/toilet wiht min A  Skilled Therapeutic Interventions/Progress Updates:    Tx focus on active therapy participation, balance, functional transfers, and ADL retraining.   Pt greeted in w/c. Agreeable to tx but reporting significant sadness over death of brother yesterday. Provided therapeutic listening, support, and encouragement, however pt exhibiting decreased affect throughout session. He completed bathing/dressing w/c level at sink with overall Mod-Max A for ADL tasks, Min A sit<stand and functional stand pivot transfer to toilet to void blader during session. Pt reporting "I can't do that" without trying adaptive methods recommended by OT in order to increase independence. Pt reports that his wife helps him with ADLs at home, and does not want to be more independent with self care. Pt provided encouragement throughout to complete these tasks at max level of independence, but due to self limiting behaviors, but requesting overall Max A from OT. He self propelled 40 ft down hallway for UB strengthening, reported fatigue, and insisted OT push him back due to fatigue. Pt left in w/c with all needs within reach at time of departure.   Therapy Documentation Precautions:  Precautions Precautions: Fall Precaution Comments: deconditioning Restrictions Weight Bearing  Restrictions: No Pain: No c/o pain during tx    ADL: :    See Function Navigator for Current Functional Status.   Therapy/Group: Individual Therapy  Skyelyn Scruggs A Ran Tullis 02/18/2017, 12:37 PM

## 2017-02-19 ENCOUNTER — Inpatient Hospital Stay (HOSPITAL_COMMUNITY): Payer: Medicare Other | Admitting: Physical Therapy

## 2017-02-19 ENCOUNTER — Inpatient Hospital Stay (HOSPITAL_COMMUNITY): Payer: Medicare Other

## 2017-02-19 ENCOUNTER — Inpatient Hospital Stay (HOSPITAL_COMMUNITY): Payer: Medicare Other | Admitting: Speech Pathology

## 2017-02-19 ENCOUNTER — Inpatient Hospital Stay (HOSPITAL_COMMUNITY): Payer: Medicare Other | Admitting: Occupational Therapy

## 2017-02-19 LAB — GLUCOSE, CAPILLARY
GLUCOSE-CAPILLARY: 156 mg/dL — AB (ref 65–99)
Glucose-Capillary: 171 mg/dL — ABNORMAL HIGH (ref 65–99)
Glucose-Capillary: 154 mg/dL — ABNORMAL HIGH (ref 65–99)
Glucose-Capillary: 177 mg/dL — ABNORMAL HIGH (ref 65–99)

## 2017-02-19 MED ORDER — GLIMEPIRIDE 1 MG PO TABS
1.0000 mg | ORAL_TABLET | Freq: Every day | ORAL | Status: DC
  Administered 2017-02-19 – 2017-02-27 (×9): 1 mg via ORAL
  Filled 2017-02-19 (×9): qty 1

## 2017-02-19 NOTE — Progress Notes (Signed)
Subjective/Complaints: Participated in therapy yesterday, despite grief about brother's death  ROS- no CP, SOB, N/V/D  Objective: Vital Signs: Blood pressure (!) 144/68, pulse 77, temperature (!) 97.5 F (36.4 C), temperature source Oral, resp. rate 19, height 5\' 9"  (1.753 m), weight 108.9 kg (240 lb 1.3 oz), SpO2 96 %. No results found. Results for orders placed or performed during the hospital encounter of 02/14/17 (from the past 72 hour(s))  Glucose, capillary     Status: Abnormal   Collection Time: 02/16/17 11:55 AM  Result Value Ref Range   Glucose-Capillary 173 (H) 65 - 99 mg/dL  Glucose, capillary     Status: Abnormal   Collection Time: 02/16/17  4:49 PM  Result Value Ref Range   Glucose-Capillary 196 (H) 65 - 99 mg/dL  Glucose, capillary     Status: Abnormal   Collection Time: 02/16/17  9:37 PM  Result Value Ref Range   Glucose-Capillary 169 (H) 65 - 99 mg/dL  Glucose, capillary     Status: Abnormal   Collection Time: 02/17/17  6:46 AM  Result Value Ref Range   Glucose-Capillary 142 (H) 65 - 99 mg/dL  Glucose, capillary     Status: Abnormal   Collection Time: 02/17/17 11:51 AM  Result Value Ref Range   Glucose-Capillary 160 (H) 65 - 99 mg/dL  Glucose, capillary     Status: Abnormal   Collection Time: 02/17/17  4:47 PM  Result Value Ref Range   Glucose-Capillary 167 (H) 65 - 99 mg/dL  Glucose, capillary     Status: Abnormal   Collection Time: 02/17/17 10:20 PM  Result Value Ref Range   Glucose-Capillary 157 (H) 65 - 99 mg/dL  Glucose, capillary     Status: Abnormal   Collection Time: 02/18/17  4:59 AM  Result Value Ref Range   Glucose-Capillary 168 (H) 65 - 99 mg/dL  Glucose, capillary     Status: Abnormal   Collection Time: 02/18/17 11:54 AM  Result Value Ref Range   Glucose-Capillary 141 (H) 65 - 99 mg/dL  Glucose, capillary     Status: Abnormal   Collection Time: 02/18/17  4:35 PM  Result Value Ref Range   Glucose-Capillary 176 (H) 65 - 99 mg/dL  Glucose,  capillary     Status: Abnormal   Collection Time: 02/18/17  8:40 PM  Result Value Ref Range   Glucose-Capillary 198 (H) 65 - 99 mg/dL  Glucose, capillary     Status: Abnormal   Collection Time: 02/19/17  6:53 AM  Result Value Ref Range   Glucose-Capillary 171 (H) 65 - 99 mg/dL     HEENT: normal Cardio: RRR and no murmur Resp: CTA B/L and decreased BS at bases GI: BS positive and + colostomy and abd wound, BS positive and Non tender non distended Extremity: stasis dermatitis BLE Skin:   Wound midline abd wound with packing, RLQ colostomy Neuro: Flat and Abnormal Motor 4/5 BUE and BLE Musc/Skel:  Normal   Assessment/Plan: 1. Functional deficits secondary to debility which require 3+ hours per day of interdisciplinary therapy in a comprehensive inpatient rehab setting. Physiatrist is providing close team supervision and 24 hour management of active medical problems listed below. Physiatrist and rehab team continue to assess barriers to discharge/monitor patient progress toward functional and medical goals. FIM: Function - Bathing Position: Wheelchair/chair at sink Body parts bathed by patient: Right arm, Left arm, Chest, Abdomen, Front perineal area Body parts bathed by helper: Back, Left lower leg, Right lower leg, Left upper leg, Right upper  leg Bathing not applicable: Buttocks Assist Level:  (Mod A)  Function- Upper Body Dressing/Undressing What is the patient wearing?: Pull over shirt/dress Pull over shirt/dress - Perfomed by patient: Thread/unthread right sleeve, Thread/unthread left sleeve, Put head through opening, Pull shirt over trunk Pull over shirt/dress - Perfomed by helper: Pull shirt over trunk Assist Level: Supervision or verbal cues Function - Lower Body Dressing/Undressing What is the patient wearing?: Underwear, Pants, Non-skid slipper socks Position: Wheelchair/chair at sink Underwear - Performed by patient: Pull underwear up/down Underwear - Performed by  helper: Thread/unthread right underwear leg, Thread/unthread left underwear leg Pants- Performed by patient: Pull pants up/down Pants- Performed by helper: Thread/unthread right pants leg, Thread/unthread left pants leg Non-skid slipper socks- Performed by helper: Don/doff right sock, Don/doff left sock Assist for footwear: Dependant Assist for lower body dressing:  (patient completes 1/5 steps, 20%, total assist level)  Function - Toileting Toileting activity did not occur: No continent bowel/bladder event Toileting steps completed by patient: Performs perineal hygiene (No clothing to adjust) Toileting steps completed by helper: Adjust clothing after toileting Toileting Assistive Devices: Grab bar or rail Assist level: Touching or steadying assistance (Pt.75%)  Function - Air cabin crew transfer assistive device: Elevated toilet seat/BSC over toilet, Grab bar Assist level to toilet: Touching or steadying assistance (Pt > 75%) Assist level from toilet: Touching or steadying assistance (Pt > 75%)  Function - Chair/bed transfer Chair/bed transfer method: Stand pivot Chair/bed transfer assist level: Touching or steadying assistance (Pt > 75%) Chair/bed transfer assistive device: Armrests, Walker Chair/bed transfer details: Verbal cues for technique, Verbal cues for precautions/safety, Verbal cues for safe use of DME/AE, Verbal cues for sequencing  Function - Locomotion: Wheelchair Will patient use wheelchair at discharge?: No Type: Manual Max wheelchair distance: 60 Assist Level: Touching or steadying assistance (Pt > 75%) Assist Level: Touching or steadying assistance (Pt > 75%) Function - Locomotion: Ambulation Assistive device: Walker-rolling Max distance: 65 Assist level: Supervision or verbal cues Assist level: Supervision or verbal cues Walk 50 feet with 2 turns activity did not occur: Safety/medical concerns Assist level: Supervision or verbal cues Walk 150 feet  activity did not occur: Safety/medical concerns Walk 10 feet on uneven surfaces activity did not occur: Safety/medical concerns  Function - Comprehension Comprehension: Auditory Comprehension assist level: Understands complex 90% of the time/cues 10% of the time  Function - Expression Expression: Verbal Expression assist level: Expresses complex ideas: With no assist  Function - Social Interaction Social Interaction assist level: Interacts appropriately 75 - 89% of the time - Needs redirection for appropriate language or to initiate interaction.  Function - Problem Solving Problem solving assist level: Solves basic 75 - 89% of the time/requires cueing 10 - 24% of the time  Function - Memory Memory assist level: Recognizes or recalls 75 - 89% of the time/requires cueing 10 - 24% of the time Patient normally able to recall (first 3 days only): Location of own room, Current season, Staff names and faces, That he or she is in a hospital  Medical Problem List and Plan: 1. 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 DVT Prophylaxis/Anticoagulation: Eliquis 3. Pain Management: Tylenol as needed 4. Mood: Provide emotional support, he does not want to attend funeral of brother  5. Neuropsych: This patient iscapable of making decisions on hisown behalf. 6. Skin/Wound Care: Routine skin checks 7. Fluids/Electrolytes/Nutrition: Routine I&O's will follow-up chemistry 8.ABLA. Follow-up CBC 9.Hypertension. Coreg 3.125 mg twice a day, Lasix 40 mg daily. 10.CAD/CABG/AVR 2006. Follow-up cardiology services as needed. Continue anticoagulation- Eliquis 11.Type 2 diabetes mellitus. Hemoglobin A1c 6.4.SSI.Check blood sugars before meals and at bedtime CBG (last 3)   Recent  Labs  02/18/17 1635 02/18/17 2040 02/19/17 0653  GLUCAP 176* 198* 171*   Resume glimipride 1mg  per day, home dose is 2mg  , monitor 12.Hyperlipidemia. Lipitor  13. Incidental finding meningioma follow-up with neurology as outpatient  LOS (Days) 5 A FACE TO FACE EVALUATION WAS PERFORMED  KIRSTEINS,ANDREW E 02/19/2017, 6:58 AM

## 2017-02-19 NOTE — Progress Notes (Signed)
Physical Therapy Session Note  Patient Details  Name: Terry Wu MRN: [id number] Date of Birth: 06-07-1932  Today's Date: 02/19/2017 PT Individual Time: 5465-0354 PT Individual Time Calculation (min): 30 min   Short Term Goals: Week 1:  PT Short Term Goal 1 (Week 1): Pt will ambulate 21' with LRAD and min assist PT Short Term Goal 2 (Week 1): Pt will initiate stair negotiation with PT PT Short Term Goal 3 (Week 1): Pt will complete BERG Balance Scale  Skilled Therapeutic Interventions/Progress Updates:    PT seated in w/c upon PT arrival, agreeable to therapy tx and denies pain. Pt pushed to gym in w/c total assist for energy conservation. Pt ambulated 1 x 52 ft, 1 x 75 ft using RW and min assist working on activity tolerance and endurance, verbal cues for breathing technique and step length. Pt performed 2 x 5 sit to stands for LE strengthening and activity tolerance with verbal cues for technique. Worked on dynamic standing balance while standing on foam without UE support to throw horseshoes, x 2 trials and requiring min to mod assist to maintain balance. Pt left seated in w/c at end of session with needs in reach.   Therapy Documentation Precautions:  Precautions Precautions: Fall Precaution Comments: deconditioning Restrictions Weight Bearing Restrictions: No   See Function Navigator for Current Functional Status.   Therapy/Group: Individual Therapy  Netta Corrigan, PT, DPT 02/19/2017, 3:09 PM

## 2017-02-19 NOTE — Progress Notes (Signed)
Occupational Therapy Session Note  Patient Details  Name: Terry Wu MRN: [id number] Date of Birth: April 17, 1932  Today's Date: 02/19/2017 OT Individual Time: 1000-1059 OT Individual Time Calculation (min): 59 min    Short Term Goals: Week 1:  OT Short Term Goal 1 (Week 1): Pt will stand at sink to groom wiht supervision OT Short Term Goal 2 (Week 1): Pt will thread BLE into pants using AE PRN OT Short Term Goal 3 (Week 1): Pt will sit to stnad wiht MIN A in prep for clohting managment OT Short Term Goal 4 (Week 1): Pt will complete walk in shower transfer with min A OT Short Term Goal 5 (Week 1): Pt will transfer to BSC/toilet wiht min A  Skilled Therapeutic Interventions/Progress Updates:    OT treatment session focused on UB strengthening, activity tolerance, standing endurance. Pt reported being tired from recent PT session, but agreeable to OT. Pt declined bathing/dressing stating he had incontinent episode this am and the nurses had already assisted with bathing/dressing. Pt stood at the sink with min guard A for 1 minute to stand and brush teeth. Pt needed cues for thoroughness. Pt brought to therapy gym and completed 15 mins on Sci Fit arm bike on level 1.5 with 1 minutes rest break in between each set of 5 mins. Pt needed extended rest break s/p there ex 2/2 fatigue. Standing balance/endurance with standing peg board puzzle activity. Tolerated 5 mins standing x2. Pt returned to room and left seated in wc with needs met.   Therapy Documentation Precautions:  Precautions Precautions: Fall Precaution Comments: deconditioning Restrictions Weight Bearing Restrictions: No Pain: Pain Assessment Pain Assessment: No/denies pain  See Function Navigator for Current Functional Status.  Therapy/Group: Individual Therapy  Valma Cava 02/19/2017, 10:23 AM

## 2017-02-19 NOTE — Progress Notes (Signed)
Pt. Has been breathing very elaborated today,VS on the normal range.Pt. Verbalized feeling fine.Terry Wu has been notified,orders were received.Keep assessing pt. Closely.

## 2017-02-19 NOTE — Progress Notes (Signed)
Speech Language Pathology Daily Session Note  Patient Details  Name: Terry Wu MRN: [id number] Date of Birth: 1931-09-29  Today's Date: 02/19/2017 SLP Individual Time: 1100-1155 SLP Individual Time Calculation (min): 55 min  Short Term Goals: Week 1: SLP Short Term Goal 1 (Week 1): Patient will consume current diet with minimal overt s/s of aspiration with mod I for use of swallowing compensatory strategies. SLP Short Term Goal 2 (Week 1): Patient will demonstrate efficient mastication with complete oral clearance of regular textures without overt s/s of aspiration over 2 sessions prior to upgrade.  SLP Short Term Goal 3 (Week 1): Patient will self-monitor and correct verbal errors at the sentence level with Min A verbal cues.  SLP Short Term Goal 4 (Week 1): Patient will demonstrate functional problem solving for basic and familiar tasks with Min A verbal cues.  SLP Short Term Goal 5 (Week 1): Patient will recall new, daily information with Min A verbal and visual cues.  SLP Short Term Goal 6 (Week 1): Patient will self-monitor and correct errors during functional tasks with Min A verbal cues.   Skilled Therapeutic Interventions: Skilled treatment session focused on dysphagia and cognitive goals. SLP facilitated session by providing skilled observation with trials of regular textures. Patient demonstrated increased use of liquid washes to facilitate oral clearance with overt cough X 2 and increased work of breathing. Patient denied difficulty and reported it was due to fatigue. RN aware and also reported she has noticed an increase in work of breathing today.   Recommend continued trials. Patient required Mod verbal cues for arousal throughout session due to fatigue for ~5 minute intervals and was Mod I for word-finding throughout a functional conversation. Patient also recalled events from previous therapy sessions with Min A verbal cues. Patient left upright in wheelchair with all needs  within reach. Continue with current plan of care.      Function:  Eating Eating   Modified Consistency Diet: Yes Eating Assist Level: More than reasonable amount of time           Cognition Comprehension Comprehension assist level: Understands complex 90% of the time/cues 10% of the time  Expression   Expression assist level: Expresses basic needs/ideas: With no assist  Social Interaction Social Interaction assist level: Interacts appropriately 90% of the time - Needs monitoring or encouragement for participation or interaction.  Problem Solving Problem solving assist level: Solves basic 75 - 89% of the time/requires cueing 10 - 24% of the time  Memory Memory assist level: Recognizes or recalls 75 - 89% of the time/requires cueing 10 - 24% of the time    Pain No/Denies Pain   Therapy/Group: Individual Therapy  Terry Wu 02/19/2017, 4:39 PM

## 2017-02-19 NOTE — Plan of Care (Signed)
Problem: RH BOWEL ELIMINATION Goal: RH STG MANAGE BOWEL WITH ASSISTANCE STG Manage Bowel with  Mod Independent Assistance.   Outcome: Not Applicable Date Met: 27/07/86  02/19/17 7544  Bowel Management Goals  STG: Pt will manage bowels with assistance 1-Total assistance  Pt has a new colostomy  Goal: RH STG MANAGE BOWEL W/EQUIPMENT W/ASSISTANCE STG Manage Bowel With Equipment With Assistance  Outcome: Not Applicable Date Met: 92/01/00  02/19/17 7121  Bowel Management Goals  STG: Pt will manage bowels with equipment with assistance 1-Total assistance  Pt has a colostomy  Problem: RH BLADDER ELIMINATION Goal: RH STG MANAGE BLADDER WITH ASSISTANCE STG Manage Bladder With mod I Assistance   Outcome: Not Progressing  02/19/17 0312  Bladder Management Goals  STG: Pt will manage bladder with assistance 2-Maximum assistance  Pt is incontinent  Problem: RH SKIN INTEGRITY Goal: RH STG SKIN FREE OF INFECTION/BREAKDOWN Pt skin will remain free from infection or new skin breakdown while hospitalized  Outcome: Not Progressing Pt has MASD in the perineum area  Problem: RH PAIN MANAGEMENT Goal: RH STG PAIN MANAGED AT OR BELOW PT'S PAIN GOAL Less than 2  Outcome: Progressing Pt pain level will be less than 2 during hospital stay

## 2017-02-19 NOTE — Progress Notes (Signed)
Physical Therapy Session Note  Patient Details  Name: Terry Wu MRN: [id number] Date of Birth: 06/18/1932  Today's Date: 02/19/2017 PT Individual Time: 0800-0855 PT Individual Time Calculation (min): 55 min   Short Term Goals: Week 1:  PT Short Term Goal 1 (Week 1): Pt will ambulate 76' with LRAD and min assist PT Short Term Goal 2 (Week 1): Pt will initiate stair negotiation with PT PT Short Term Goal 3 (Week 1): Pt will complete BERG Balance Scale  Skilled Therapeutic Interventions/Progress Updates:   Pt sitting EOB upon arrival and agreeable to therapy, no c/o pain.   Worked on tolerance to OOB activity this session: -w/c mobility 9' w/ supervision utilizing bilateral UEs, O2 sat 91%  -ambulation w/ RW and Min guard/close supervision, 123' and 112', O2 sat 99%  -NuStep @ L3, 2 bouts of 5 minutes, pt c/o dizziness at end of last bout and had moderate increase in work of breathing, O2 @ 99%, dizziness resolved w/ seated rest for 2-3 minutes  NMR while performing BERG Balance Scale as detailed in flowsheets. Scored 22/56. Pt more limited by fatigue and work of breathing w/ balance tasks than balance itself, will continue to assess. He may also score better if test was performed at the beginning of the session.   Multiple seated rest breaks throughout session secondary to fatigue, pt maintained O2 sat >91% on room air and able to decrease work of breathing w/ verbal cues.   Ended session in w/c, call bell within reach and all needs met.   Therapy Documentation Precautions:  Precautions Precautions: Fall Precaution Comments: deconditioning Restrictions Weight Bearing Restrictions: No Vital Signs: Therapy Vitals Temp: (!) 97.5 F (36.4 C) Temp Source: Oral Pulse Rate: 77 BP: (!) 144/68 Patient Position (if appropriate): Sitting  See Function Navigator for Current Functional Status.   Therapy/Group: Individual Therapy  [name] 02/19/2017, 8:56 AM

## 2017-02-19 NOTE — Consult Note (Signed)
Camp Nurse ostomy follow up Surgical team following for assessment and plan of care for abd wound.  Stoma type/location: Colostomy stoma from surgery on 8/16.  Current pouch is leaking behind the barrier when removed. Stomal assessment/size: Stoma red and viable, 1 1/2 inches, slightly above skin level. Peristomal assessment: Intact skin surrounding Output: Large amt brown stool in the pouch Ostomy pouching: 1pc.  Education provided: Demonstrated pouch change using one piece pouch and barrier ring to maintain a seal. Pt watched procedure but did not participate in application.  He was able to open and close velcro to empty. No family present. Reviewed ordering supplies and pouching routines.  Pt will need a large amt of assistance with pouching activities.Supplies at the bedside for staff nurse use. Educational materials left at the bedside. Enrolled patient in [facility] Start Discharge program: Yes Julien Girt MSN, RN, [place], [place], [place]

## 2017-02-20 ENCOUNTER — Inpatient Hospital Stay (HOSPITAL_COMMUNITY): Payer: Medicare Other | Admitting: Physical Therapy

## 2017-02-20 ENCOUNTER — Inpatient Hospital Stay (HOSPITAL_COMMUNITY): Payer: Medicare Other | Admitting: Occupational Therapy

## 2017-02-20 ENCOUNTER — Inpatient Hospital Stay (HOSPITAL_COMMUNITY): Payer: Medicare Other | Admitting: Speech Pathology

## 2017-02-20 DIAGNOSIS — I482 Chronic atrial fibrillation: Secondary | ICD-10-CM

## 2017-02-20 DIAGNOSIS — E8770 Fluid overload, unspecified: Secondary | ICD-10-CM

## 2017-02-20 DIAGNOSIS — Z952 Presence of prosthetic heart valve: Secondary | ICD-10-CM

## 2017-02-20 LAB — BLOOD GAS, ARTERIAL
Acid-Base Excess: 4.4 mmol/L — ABNORMAL HIGH (ref 0.0–2.0)
BICARBONATE: 28.3 mmol/L — AB (ref 20.0–28.0)
DRAWN BY: 511841
FIO2: 21
O2 Saturation: 93.6 %
PATIENT TEMPERATURE: 98.6
PH ART: 7.445 (ref 7.350–7.450)
pCO2 arterial: 41.9 mmHg (ref 32.0–48.0)
pO2, Arterial: 68.4 mmHg — ABNORMAL LOW (ref 83.0–108.0)

## 2017-02-20 LAB — BASIC METABOLIC PANEL
Anion gap: 6 (ref 5–15)
BUN: 5 mg/dL — ABNORMAL LOW (ref 6–20)
CHLORIDE: 101 mmol/L (ref 101–111)
CO2: 29 mmol/L (ref 22–32)
Calcium: 8.6 mg/dL — ABNORMAL LOW (ref 8.9–10.3)
Creatinine, Ser: 0.79 mg/dL (ref 0.61–1.24)
GFR calc non Af Amer: >60 mL/min (ref >60)
Glucose, Bld: 134 mg/dL — ABNORMAL HIGH (ref 65–99)
POTASSIUM: 3.6 mmol/L (ref 3.5–5.1)
SODIUM: 136 mmol/L (ref 135–145)

## 2017-02-20 LAB — GLUCOSE, CAPILLARY
GLUCOSE-CAPILLARY: 123 mg/dL — AB (ref 65–99)
GLUCOSE-CAPILLARY: 128 mg/dL — AB (ref 65–99)
Glucose-Capillary: 152 mg/dL — ABNORMAL HIGH (ref 65–99)
Glucose-Capillary: 128 mg/dL — ABNORMAL HIGH (ref 65–99)

## 2017-02-20 LAB — BRAIN NATRIURETIC PEPTIDE: B NATRIURETIC PEPTIDE 5: 327.9 pg/mL — AB (ref 0.0–100.0)

## 2017-02-20 MED ORDER — FUROSEMIDE 40 MG PO TABS
40.0000 mg | ORAL_TABLET | Freq: Two times a day (BID) | ORAL | Status: DC
  Administered 2017-02-20 – 2017-02-27 (×14): 40 mg via ORAL
  Filled 2017-02-20 (×14): qty 1

## 2017-02-20 MED ORDER — AMOXICILLIN 500 MG PO CAPS
1000.0000 mg | ORAL_CAPSULE | Freq: Two times a day (BID) | ORAL | Status: DC
  Administered 2017-02-20 (×2): 1000 mg via ORAL
  Filled 2017-02-20 (×3): qty 2

## 2017-02-20 NOTE — Progress Notes (Signed)
    [id number] op wound check  Subjective: He is up in chair again,  Staples are red at the lower part of the incision.  With him up in the chair its hard to see whole incision.   Objective: Vital signs in last 24 hours: Temp:  [97.8 F (36.6 C)-98 F (36.7 C)] 98 F (36.7 C) (08/29 0500) Pulse Rate:  [85-89] 85 (08/29 0500) Resp:  [19-28] 28 (08/29 0500) BP: (123-152)/(72-75) 123/75 (08/29 0500) SpO2:  [93 %-96 %] 96 % (08/29 0500) Weight:  [110.5 kg (243 lb 11.2 oz)] 110.5 kg (243 lb 11.2 oz) (08/29 0500) Last BM Date: 02/19/17  Intake/Output from previous day: 08/28 0701 - 08/29 0700 In: 960 [P.O.:960] Out: -  Intake/Output this shift: Total I/O In: 240 [P.O.:240] Out: -   General appearance: alert, cooperative and he is very tired thisAM GI: large abdomen, ostomy working well.  Staples out tomorrow, lower part may open more.   Lab Results:  No results for input(s): WBC, HGB, HCT, PLT in the last 72 hours.  BMET No results for input(s): NA, K, CL, CO2, GLUCOSE, BUN, CREATININE, CALCIUM in the last 72 hours. PT/INR No results for input(s): LABPROT, INR in the last 72 hours.   Recent Labs Lab 02/15/17 0546  AST 26  ALT 25  ALKPHOS 26*  BILITOT 1.0  PROT 5.8*  ALBUMIN 2.5*     Lipase  No results found for: LIPASE   Medications: . amoxicillin  1,000 mg Oral Q12H  . apixaban  5 mg Oral BID  . atorvastatin  20 mg Oral q1800  . carvedilol  3.125 mg Oral BID WC  . dorzolamide-timolol  1 drop Both Eyes BID  . furosemide  40 mg Oral Daily  . glimepiride  1 mg Oral Q breakfast  . insulin aspart  0-15 Units Subcutaneous TID WC  . insulin aspart  0-5 Units Subcutaneous QHS  . pantoprazole  40 mg Oral Q1200  . polyethylene glycol  17 g Oral Daily    Assessment/Plan 1. CVA L MCA - CIR 2. Colon ca - s/p colectomy and colostomy on 02/07/17 - T2N0 adenocarcinoma - Dr. Donnie Mesa  POD 11 3.Partial open wound 4. DVT proph - Eliquis 5. ABLA - monitor CBC 6.  HTN - Coreg 7. CAD 8. Dyslipidemia - Lipitor 9. DM2 FEN: D3 ID: [email or web address] 8/24 =>> day 4 DVT: apixaban    Plan:  Get staples out tomorrow and I will see how it does.  Currently open portion looks fine.    LOS: 6 days    Ishika Chesterfield 02/20/2017 [phone]

## 2017-02-20 NOTE — Progress Notes (Signed)
Physical Therapy Session Note  Patient Details  Name: Terry Wu MRN: [id number] Date of Birth: 1932-05-04  Today's Date: 02/20/2017 PT Individual Time: 1130-1200 PT Individual Time Calculation (min): 30 min   Short Term Goals: Week 1:  PT Short Term Goal 1 (Week 1): Pt will ambulate 47' with LRAD and min assist PT Short Term Goal 2 (Week 1): Pt will initiate stair negotiation with PT PT Short Term Goal 3 (Week 1): Pt will complete BERG Balance Scale  Skilled Therapeutic Interventions/Progress Updates:    no c/o pain but pt lethargic and falling asleep as soon as any sort of stimulation (verbal or tactile) ceases, requiring tactile cues to stay awake.  Pt taken to dayroom total assist for energy conservation and time management, O2 on room air following total assist transport noted to be 82%, requires 5 minutes to return to 90%.  PT placed pt on 2L via nasal cannula as directed by RN and O2 maintained at 95% or greater remainder of session.  Pt engaged in seated game of zoom ball focus on activity tolerance 3 trials to fatigue with max multimodal cues for how to play.  Pt returned to room at end of session, call bell in reach and needs met.   Therapy Documentation Precautions:  Precautions Precautions: Fall Precaution Comments: deconditioning Restrictions Weight Bearing Restrictions: No   See Function Navigator for Current Functional Status.   Therapy/Group: Individual Therapy  Michel Santee 02/20/2017, 12:02 PM

## 2017-02-20 NOTE — Patient Care Conference (Signed)
Inpatient RehabilitationTeam Conference and Plan of Care Update Date: 02/20/2017   Time: 11:25 AM    Patient Name: Terry Wu      Medical Record Number: [id number]  Date of Birth: [birth year] Sex: Male         Room/Bed: 4M03C/4M03C-01 Payor Info: Payor: MEDICARE / Plan: MEDICARE PART A AND B / Product Type: *No Product type* /    Admitting Diagnosis: CVA Debility  Admit Date/Time:  02/14/2017  3:27 PM Admission Comments: No comment available   Primary Diagnosis:  Debility Principal Problem: Debility  Patient Active Problem List   Diagnosis Date Noted  . 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 Debility 02/14/2017  . Cerebral embolism with cerebral infarction 02/10/2017  . Colostomy in place [facility])   . Persistent atrial fibrillation ([place])   . Acute respiratory failure ([place])   . Acute respiratory failure with hypoxia ([place])   . S/P abdominal surgery, follow-up exam   . Preoperative cardiovascular examination   . Chronic diastolic heart failure ([place]) 02/01/2017  . Dehydration 02/01/2017  . Acute kidney injury ([place]) 02/01/2017  . Diarrhea 01/30/2017  . Gait difficulty 08/22/2015  . Dizziness 08/22/2015  . Chronic atrial fibrillation ([place]) 08/22/2015  . Aortic aneurysm ([place]) 02/01/2014  . CAP (community acquired pneumonia) 02/12/2013  . Hyponatremia 02/12/2013  . COPD (chronic obstructive pulmonary disease) ([place]) 02/12/2013  . Bilateral leg edema 02/12/2013  . DIZZINESS 01/26/2010  . SHORTNESS OF BREATH 10/29/2008  . Diabetes type 2, controlled ([place]) 07/23/2008  . Mixed hyperlipidemia 07/23/2008  . HYPERTENSION, BENIGN 07/23/2008  . ESOPHAGEAL REFLUX 07/23/2008  . S/P AVR 07/23/2008  . CORONARY ARTERY BYPASS GRAFT, HX OF 07/23/2008    Expected Discharge Date: Expected Discharge Date: 02/27/17  Team Members Present: Physician leading conference: Dr. Alysia Penna Social Worker Present: Ovidio Kin, LCSW Nurse Present: Rayetta Humphrey, RN PT Present:  Dwyane Dee, PT OT Present: Cherylynn Ridges, OT SLP Present: Windell Moulding, SLP PPS Coordinator present : Daiva Nakayama, RN, CRRN     Current Status/Progress Goal Weekly Team Focus  Medical   Patient on antibiotics, no respiratory distress, cardiac arrhythmia  Maintaining medical stability on rehabilitation unit  Address variable heart rate   Bowel/Bladder   Incontinent  Keep patient clean and dry.  Monitor for incontinence and clean and dry as needed.   Swallow/Nutrition/ Hydration   Dys. 3 textures with thin liquids via cup, intermittent supervision   Mod I  trials of upgraded textures and thin liquids with straw    ADL's   Mod/max A LB dressing, min gaurd A transfers  Supervision/Mod I/ Mod A LB dressing  activity tolerance, endurance, modified bathing/dressin, pt/family ed   Mobility   Min guard to supervision for transfers and gait up to 123' w/ RW  supervision/Mod I  endurance, general strength, tolerance to OOB activity, independence w/ functional mobility   Communication   Supervision-Mod I  supervision  word-finding    Safety/Cognition/ Behavioral Observations  Min A  Supervision-Min A  problem solving, recall, awareness    Pain   No c/o pain.  <3.  Monitor for pain and treat as needed.   Skin   Midline surgical inscion with dehisence and MASD on scrotum and peritoneal area.  Healing of midline inscion and peritoneal MASD.  Monitor surgical inscion for infection and change dressing as ordered. Keep patient clean and dry.      *See Care Plan and progress notes for long and short-term goals.  Barriers to Discharge  Current Status/Progress Possible Resolutions Date Resolved   Physician    Medical stability     Progressing towards goals  Reconsult cardiology to evaluate      Nursing                  PT  Inaccessible home environment;Home environment access/layout;Decreased caregiver support  Split-level home requires stair negotiation              OT                   SLP                SW                Discharge Planning/Teaching Needs:  Home with wife who can provide supervision level, needs to come in for education prior to DC home      Team Discussion:  Cardiliogist seeing today due to heart issues and SOB. IV antibiotics switched to po today. Having word finding issues-Speech made aware of. Goals supervision-mod/i level needs to reach these goals due to wife can not provide more assist than this level.  Revisions to Treatment Plan:  DC 9/5    Continued Need for Acute Rehabilitation Level of Care: The patient requires daily medical management by a physician with specialized training in physical medicine and rehabilitation for the following conditions: Daily direction of a multidisciplinary physical rehabilitation program to ensure safe treatment while eliciting the highest outcome that is of practical value to the patient.: Yes Daily medical management of patient stability for increased activity during participation in an intensive rehabilitation regime.: Yes Daily analysis of laboratory values and/or radiology reports with any subsequent need for medication adjustment of medical intervention for : Cardiac problems;Neurological problems;Pulmonary problems  Elease Hashimoto 02/20/2017, 12:52 PM

## 2017-02-20 NOTE — Progress Notes (Signed)
Subjective/Complaints:   ROS- no CP, SOB, N/V/D  Objective: Vital Signs: Blood pressure 123/75, pulse 85, temperature 98 F (36.7 C), temperature source Oral, resp. rate (!) 28, height '5\' 9"'$  (1.753 m), weight 110.5 kg (243 lb 11.2 oz), SpO2 96 %. Dg Chest 1 View  Result Date: 02/19/2017 CLINICAL DATA:  Shortness of breath at rest EXAM: CHEST 1 VIEW COMPARISON:  02/08/2017 FINDINGS: Low volume chest with hazy densities at the bases, right more than left. Appearance is similar to prior. There is chronic cardiomegaly and vascular pedicle widening. Status post CABG. IMPRESSION: 1. Bibasilar opacity with low volumes favoring atelectasis. Infection or aspiration is not excluded. 2. Cardiomegaly without failure. Electronically Signed   By: Monte Fantasia M.D.   On: 02/19/2017 15:32   Results for orders placed or performed during the hospital encounter of 02/14/17 (from the past 72 hour(s))  Glucose, capillary     Status: Abnormal   Collection Time: 02/17/17 11:51 AM  Result Value Ref Range   Glucose-Capillary 160 (H) 65 - 99 mg/dL  Glucose, capillary     Status: Abnormal   Collection Time: 02/17/17  4:47 PM  Result Value Ref Range   Glucose-Capillary 167 (H) 65 - 99 mg/dL  Glucose, capillary     Status: Abnormal   Collection Time: 02/17/17 10:20 PM  Result Value Ref Range   Glucose-Capillary 157 (H) 65 - 99 mg/dL  Glucose, capillary     Status: Abnormal   Collection Time: 02/18/17  4:59 AM  Result Value Ref Range   Glucose-Capillary 168 (H) 65 - 99 mg/dL  Glucose, capillary     Status: Abnormal   Collection Time: 02/18/17 11:54 AM  Result Value Ref Range   Glucose-Capillary 141 (H) 65 - 99 mg/dL  Glucose, capillary     Status: Abnormal   Collection Time: 02/18/17  4:35 PM  Result Value Ref Range   Glucose-Capillary 176 (H) 65 - 99 mg/dL  Glucose, capillary     Status: Abnormal   Collection Time: 02/18/17  8:40 PM  Result Value Ref Range   Glucose-Capillary 198 (H) 65 - 99 mg/dL   Glucose, capillary     Status: Abnormal   Collection Time: 02/19/17  6:53 AM  Result Value Ref Range   Glucose-Capillary 171 (H) 65 - 99 mg/dL  Glucose, capillary     Status: Abnormal   Collection Time: 02/19/17 11:48 AM  Result Value Ref Range   Glucose-Capillary 154 (H) 65 - 99 mg/dL  Glucose, capillary     Status: Abnormal   Collection Time: 02/19/17  4:33 PM  Result Value Ref Range   Glucose-Capillary 177 (H) 65 - 99 mg/dL  Glucose, capillary     Status: Abnormal   Collection Time: 02/19/17  9:21 PM  Result Value Ref Range   Glucose-Capillary 156 (H) 65 - 99 mg/dL  Glucose, capillary     Status: Abnormal   Collection Time: 02/20/17  6:31 AM  Result Value Ref Range   Glucose-Capillary 152 (H) 65 - 99 mg/dL     HEENT: normal Cardio: RRR and no murmur Resp: CTA B/L and decreased BS at bases GI: BS positive and + colostomy and abd wound, BS positive and Non tender non distended Extremity: stasis dermatitis BLE Skin:   Wound midline abd wound with packing, RLQ colostomy Neuro: Flat and Abnormal Motor 4/5 BUE and BLE Musc/Skel:  Normal   Assessment/Plan: 1. Functional deficits secondary to debility which require 3+ hours per day of interdisciplinary therapy in a  comprehensive inpatient rehab setting. Physiatrist is providing close team supervision and 24 hour management of active medical problems listed below. Physiatrist and rehab team continue to assess barriers to discharge/monitor patient progress toward functional and medical goals. FIM: Function - Bathing Position: Wheelchair/chair at sink Body parts bathed by patient: Right arm, Left arm, Chest, Abdomen, Front perineal area Body parts bathed by helper: Back, Left lower leg, Right lower leg, Left upper leg, Right upper leg Bathing not applicable: Buttocks Assist Level:  (Mod A)  Function- Upper Body Dressing/Undressing What is the patient wearing?: Pull over shirt/dress Pull over shirt/dress - Perfomed by patient:  Thread/unthread right sleeve, Thread/unthread left sleeve, Put head through opening, Pull shirt over trunk Pull over shirt/dress - Perfomed by helper: Pull shirt over trunk Assist Level: Supervision or verbal cues Function - Lower Body Dressing/Undressing What is the patient wearing?: Underwear, Pants, Non-skid slipper socks Position: Wheelchair/chair at sink Underwear - Performed by patient: Pull underwear up/down Underwear - Performed by helper: Thread/unthread right underwear leg, Thread/unthread left underwear leg Pants- Performed by patient: Pull pants up/down Pants- Performed by helper: Thread/unthread right pants leg, Thread/unthread left pants leg Non-skid slipper socks- Performed by helper: Don/doff right sock, Don/doff left sock Assist for footwear: Dependant Assist for lower body dressing:  (patient completes 1/5 steps, 20%, total assist level)  Function - Toileting Toileting activity did not occur: No continent bowel/bladder event Toileting steps completed by patient: Performs perineal hygiene (No clothing to adjust) Toileting steps completed by helper: Adjust clothing after toileting Toileting Assistive Devices: Grab bar or rail Assist level: Touching or steadying assistance (Pt.75%)  Function - Air cabin crew transfer assistive device: Elevated toilet seat/BSC over toilet, Grab bar Assist level to toilet: Touching or steadying assistance (Pt > 75%) Assist level from toilet: Touching or steadying assistance (Pt > 75%)  Function - Chair/bed transfer Chair/bed transfer method: Stand pivot Chair/bed transfer assist level: Touching or steadying assistance (Pt > 75%) Chair/bed transfer assistive device: Armrests, Walker Chair/bed transfer details: Verbal cues for technique, Verbal cues for precautions/safety, Verbal cues for safe use of DME/AE, Verbal cues for sequencing  Function - Locomotion: Wheelchair Will patient use wheelchair at discharge?: No Type:  Manual Max wheelchair distance: 58' Assist Level: Supervision or verbal cues Assist Level: Supervision or verbal cues Function - Locomotion: Ambulation Assistive device: Walker-rolling Max distance: 75 ft Assist level: Touching or steadying assistance (Pt > 75%) Assist level: Touching or steadying assistance (Pt > 75%) Walk 50 feet with 2 turns activity did not occur: Safety/medical concerns Assist level: Touching or steadying assistance (Pt > 75%) Walk 150 feet activity did not occur: Safety/medical concerns Walk 10 feet on uneven surfaces activity did not occur: Safety/medical concerns  Function - Comprehension Comprehension: Auditory Comprehension assist level: Understands complex 90% of the time/cues 10% of the time  Function - Expression Expression: Verbal Expression assist level: Expresses basic needs/ideas: With no assist  Function - Social Interaction Social Interaction assist level: Interacts appropriately 90% of the time - Needs monitoring or encouragement for participation or interaction.  Function - Problem Solving Problem solving assist level: Solves basic 75 - 89% of the time/requires cueing 10 - 24% of the time  Function - Memory Memory assist level: Recognizes or recalls 75 - 89% of the time/requires cueing 10 - 24% of the time Patient normally able to recall (first 3 days only): Location of own room, Current season, Staff names and faces, That he or she is in a hospital  Medical Problem List  and Plan: 1. Decreased functional mobility debilitation/encephalopathysecondary to colon cancer status post colectomy with creation of descending colostomy 02/07/2017/patchy acute small left MCA and MCA/ACA territory infarcts likely embolic Cont CIR PT OT SLP Team conference today please see physician documentation under team conference tab, met with team face-to-face to discuss problems,progress, and goals. Formulized individual treatment plan based on medical history,  underlying problem and comorbidities.    2. DVT Prophylaxis/Anticoagulation: Eliquis 3. Pain Management: Tylenol as needed 4. Mood: Provide emotional support, he does not want to attend funeral of brother                  5. Neuropsych: This patient iscapable of making decisions on hisown behalf. 6. Skin/Wound Care: Routine skin checks 7. Fluids/Electrolytes/Nutrition: Routine I&O's will follow-up chemistry 8.ABLA. Follow-up CBC 9.Hypertension. Coreg 3.125 mg twice a day, Lasix 40 mg daily. 10.CAD/CABG/AVR 2006. Follow-up cardiology services as needed. Continue anticoagulation- Eliquis 11.Type 2 diabetes mellitus. Hemoglobin A1c 6.4.SSI.Check blood sugars before meals and at bedtime CBG (last 3)    Recent Labs  02/19/17 1633 02/19/17 2121 02/20/17 0631  GLUCAP 177* 156* 152*   Resume glimipride '1mg'$  per day, home dose is '2mg'$  , will increase to '2mg'$  12.Hyperlipidemia. Lipitor  13. Incidental finding meningioma follow-up with neurology as outpatient 14.  On IV abx for ?PNA vs intra abdominal infx, per D/C summ ok for amoxil will change and determine duration of tx LOS (Days) 6 A FACE TO FACE EVALUATION WAS PERFORMED  KIRSTEINS,ANDREW E 02/20/2017, 7:48 AM

## 2017-02-20 NOTE — Consult Note (Signed)
Cardiology Consultation:   Patient ID: Terry Wu; [id number]; [birth year]   Admit date: 02/14/2017 Date of Consult: 02/20/2017  Primary Care Provider: Sinda Du, MD Primary Cardiologist: Dr. Rozann Lesches   Patient Profile:   Terry Wu is a 83 y.o. male with a hx of aortic aneurysm, chronic atrial fibrillation, CAD s/p CABG/AVR 2006, HTN, type 2 DM, prostate cancer with radioactive seed impant and post-op CVA 01/2017 who is being seen today for the evaluation of CHF at the request of Dr Letta Pate.  History of Present Illness:   Terry Wu has a history of CABG/AVR 2006, normal Myovue in 2012, EF 60-65% by echo 02/08/17 and normal valve function. He was admitted to the hospital on 01/30/17 for evaluation of diarrhea and blood in. A colonoscopy was done on 02/05/17 showed a large fungating and ulcerated non-obstructing large mass was found in the distal sigmoid colon. He was cleared for colon surgery by Dr. [name] on 02/06/17. It was noted that his biggest issues would be age, functional status and COPD with need for pulmonary toilet. On 02/07/17 he underwent a Sigmoid colectomy with creation of descending colostomy and repair of umbilical hernia. He required intensive care postoperatively due to hypotension requiring pressors and continued need for ventilation. His post-operative course was also complicated by CVA. He improved and was discharged to CIR on 02/14/17. He has some pitting edema while hospitalized that improved with discontinuation of IV fluids and lasix. In CIR he is currently on lasix 40 mg daily. His current weight is 243 lbs. At hospital discharge on 8/23 his wt was 241 lbs. His weight at the last office visit was 244 and he was stable at that time.   In CIR the staff has noted fluctuating heart rates, high and low, increased shortness of breath and a lack of focus of the patient. They held off on PT today. Upon exam the patient states that he is sleepy as he was awoken at  4 am and was unable to return to sleep. He denies shortness of breath, although he appears to be working harder to breath while he is sitting in chair talking. He denies chest pain or pressure. He does admit to orthopnea when in the bed requiring elevation of HOB. He walked yesterday several times in the hall with DOE and fatigue, but pt states that he feels this is to be expected and he has made slow progress. He has 2+ pitting edema. He notes the only stroke residual is that he is having some trouble finding his words.   The patient was last seen in the cardiology office By Dr. Domenic Polite on 10/10/16 st which time he was noted to be taking the following cardiac medications: Norvasc, Lipitor, Coreg, Aldactone, potassium supplements, Demadex, and Micardis. He was maintaining rate-controlled atrial fibrillation and on warfarin for stroke risk reduction with therapeutic INR,   Past Medical History:  Diagnosis Date  . Aortic aneurysm (HCC)    Ascending thoracic - 4.8 cm by CT 2017  . Aortic regurgitation    Bioprosthetic AVR 2006  . Atrial fibrillation with controlled ventricular response ([place]) 08/22/2015   CHADS2VASC=5, on Coumadin  . CAD (coronary artery disease)    Multivessel status post CABG 2006  . Complication of anesthesia    demerol per wife  . Esophageal reflux   . Essential hypertension   . History of dizziness   . Mixed hyperlipidemia   . Type 2 diabetes mellitus [facility])     Past Surgical  History:  Procedure Laterality Date  . AORTIC VALVE REPLACEMENT  2006   Edwards Pericardial tissue valve  . COLON RESECTION SIGMOID N/A 02/07/2017   Procedure: SIGMOID COLECTOMY WITH COLOSTOMY;  Surgeon: Donnie Mesa, MD;  Location: [place];  Service: General;  Laterality: N/A;  . COLONOSCOPY N/A 02/05/2017   Procedure: COLONOSCOPY;  Surgeon: Rogene Houston, MD;  [facility];  Service: Endoscopy;  Laterality: N/A;  . CORONARY ARTERY BYPASS GRAFT  2006   LIMA to LAD, SVG to diagonal,  SVG to OM, SVG to PDA   . HERNIA REPAIR    . TOTAL KNEE ARTHROPLASTY Bilateral   . UMBILICAL HERNIA REPAIR N/A 02/07/2017   Procedure: HERNIA REPAIR UMBILICAL ADULT;  Surgeon: Donnie Mesa, MD;  Location: [name];  Service: General;  Laterality: N/A;     Home Medications:  Prior to Admission medications   Medication Sig Start Date End Date Taking? Authorizing Provider  amoxicillin (AMOXIL) 500 MG capsule Take 2,000 mg by mouth as directed.     [provider]  apixaban (ELIQUIS) 5 MG TABS tablet Take 1 tablet (5 mg total) by mouth 2 (two) times daily. 02/14/17   Florencia Reasons, MD  atorvastatin (LIPITOR) 20 MG tablet TAKE ONE TABLET BY MOUTH ONCE DAILY 09/06/16   Josue Hector, MD  carvedilol (COREG) 3.125 MG tablet Take 1 tablet (3.125 mg total) by mouth 2 (two) times daily with a meal. 02/14/17   Florencia Reasons, MD  Coenzyme Q10 (CO Q 10) 100 MG CAPS Take 1 capsule by mouth every morning.    [provider]  dorzolamide-timolol (COSOPT) 22.3-6.8 MG/ML ophthalmic solution Place 1 drop into both eyes 2 (two) times daily.    [provider]  glimepiride (AMARYL) 2 MG tablet Take 2 mg by mouth daily before breakfast.  02/16/13   Sinda Du, MD  magnesium oxide (MAG-OX) 400 MG tablet Take 400 mg by mouth daily.    [provider]  Multiple Vitamins-Minerals (ICAPS PO) Take 2 tablets by mouth 3 (three) times daily after meals.     [provider]  pantoprazole (PROTONIX) 40 MG tablet Take 1 tablet (40 mg total) by mouth daily. 09/08/15   Barrett, Evelene Croon, PA-C  polyethylene glycol (MIRALAX / GLYCOLAX) packet Take 17 g by mouth daily. 02/15/17   Florencia Reasons, MD  spironolactone (ALDACTONE) 25 MG tablet TAKE ONE TABLET BY MOUTH ONCE DAILY 09/06/16   Satira Sark, MD  torsemide (DEMADEX) 100 MG tablet Take 50 mg by mouth daily.     [provider]    Inpatient Medications: Scheduled Meds: . amoxicillin  1,000 mg Oral Q12H  . apixaban  5 mg Oral BID  .  atorvastatin  20 mg Oral q1800  . carvedilol  3.125 mg Oral BID WC  . dorzolamide-timolol  1 drop Both Eyes BID  . furosemide  40 mg Oral Daily  . glimepiride  1 mg Oral Q breakfast  . insulin aspart  0-15 Units Subcutaneous TID WC  . insulin aspart  0-5 Units Subcutaneous QHS  . pantoprazole  40 mg Oral Q1200  . polyethylene glycol  17 g Oral Daily   Continuous Infusions:  PRN Meds: ondansetron **OR** ondansetron (ZOFRAN) IV, sorbitol  Allergies:    Allergies  Allergen Reactions  . Aspirin     Unknown, per pt  . Demerol     Unknown, per pt    Social History:   Social History   Social History  .  Marital status: Married    Spouse name: N/A  . Number of children: N/A  . Years of education: N/A   Occupational History  . Retired    Social History Main Topics  . Smoking status: Never Smoker  . Smokeless tobacco: Never Used  . Alcohol use No  . Drug use: No  . Sexual activity: Not Currently   Other Topics Concern  . Not on file   Social History Narrative  . No narrative on file    Family History:    Family History  Problem Relation Age of Onset  . Heart Problems Mother   . Stroke Father   . Cancer Unknown      ROS:  Please see the history of present illness.  ROS  All other ROS reviewed and negative.     Physical Exam/Data:   Vitals:   02/19/17 0500 02/19/17 1300 02/20/17 0500 02/20/17 1005  BP: (!) 144/68 (!) 152/72 123/75 129/79  Pulse: 77 89 85 64  Resp:  19 (!) 28 (!) 32  Temp: (!) 97.5 F (36.4 C) 97.8 F (36.6 C) 98 F (36.7 C) 97.8 F (36.6 C)  TempSrc: Oral Oral Oral Oral  SpO2:  93% 96% 92%  Weight: 240 lb 1.3 oz (108.9 kg)  243 lb 11.2 oz (110.5 kg)   Height:        Intake/Output Summary (Last 24 hours) at 02/20/17 1028 Last data filed at 02/20/17 0852  Gross per 24 hour  Intake              960 ml  Output                0 ml  Net              960 ml   Filed Weights   02/18/17 0500 02/19/17 0500 02/20/17 0500  Weight: (!)  302 lb (137 kg) 240 lb 1.3 oz (108.9 kg) 243 lb 11.2 oz (110.5 kg)   Body mass index is 35.99 kg/m.  General:  Chronically ill appearing, obese, elderly male, no acute distress HEENT: normal Lymph: no adenopathy Neck: no JVD Endocrine:  No thryomegaly Vascular: No carotid bruits; Cardiac:  normal S1, S2; irregularly irregular rhythm, no murmur Lungs:  clear to auscultation bilaterally, no wheezing, rhonchi or rales, decreased breath sounds Abd: soft, nontender, colostomy on left side, Dressing midline Ext: 2+ lower extremity edema Musculoskeletal:  No deformities, BUE and BLE strength normal and equal Skin: warm and dry  Neuro:  CNs 2-12 intact, no focal abnormalities noted Psych:  Normal affect   EKG:  The EKG was personally reviewed and demonstrates:  none Telemetry:  Non-tele  Relevant CV Studies:  Echocardiogram 02/08/17 Study Conclusions  - Left ventricle: The cavity size was normal. Systolic function was   normal. The estimated ejection fraction was in the range of 55%   to 60%. Wall motion was normal; there were no regional wall   motion abnormalities. Doppler parameters are consistent with high   ventricular filling pressure. - Aortic valve: A 42mm Edwards pericradial bioprosthesis was   present. - Mitral valve: Transvalvular velocity was within the normal range.   There was no evidence for stenosis. There was no regurgitation. - Left atrium: The atrium was severely dilated. - Right ventricle: The cavity size was normal. Wall thickness was   normal. Systolic function was normal. - Right atrium: The atrium was moderately dilated. - Tricuspid valve: There was mild regurgitation. - Pulmonary arteries: PA peak  pressure: 42 mm Hg (S).  Impressions:  - Compared with the echo 08/22/15, the ascending aorta has increased   from 4.4 to 4.6 cm. Bioprosthetic aortic valve gradient has   increased from 10 mmHg to 12 mmHg.  Laboratory Data:  Chemistry Recent Labs Lab  02/14/17 0401 02/15/17 0546  NA 136 136  K 4.1 3.9  CL 100* 98*  CO2 32 29  GLUCOSE 175* 158*  BUN 6 7  CREATININE 0.69 0.76  CALCIUM 8.2* 8.6*  GFRNONAA >60 >60  GFRAA >60 >60  ANIONGAP 4* 9     Recent Labs Lab 02/15/17 0546  PROT 5.8*  ALBUMIN 2.5*  AST 26  ALT 25  ALKPHOS 26*  BILITOT 1.0   Hematology Recent Labs Lab 02/14/17 0401 02/15/17 0546  WBC 9.3 7.9  RBC 2.87* 3.10*  HGB 8.7* 9.6*  HCT 27.4* 29.7*  MCV 95.5 95.8  MCH 30.3 31.0  MCHC 31.8 32.3  RDW 13.6 13.8  PLT 201 222   Cardiac EnzymesNo results for input(s): TROPONINI in the last 168 hours. No results for input(s): TROPIPOC in the last 168 hours.  BNPNo results for input(s): BNP, PROBNP in the last 168 hours.  DDimer No results for input(s): DDIMER in the last 168 hours.  Radiology/Studies:  Dg Chest 1 View  Result Date: 02/19/2017 CLINICAL DATA:  Shortness of breath at rest EXAM: CHEST 1 VIEW COMPARISON:  02/08/2017 FINDINGS: Low volume chest with hazy densities at the bases, right more than left. Appearance is similar to prior. There is chronic cardiomegaly and vascular pedicle widening. Status post CABG. IMPRESSION: 1. Bibasilar opacity with low volumes favoring atelectasis. Infection or aspiration is not excluded. 2. Cardiomegaly without failure. Electronically Signed   By: Monte Fantasia M.D.   On: 02/19/2017 15:32    Assessment and Plan:   Chronic diastolic heart failure -Called to see pt for increased shortness of breath and lower extremity edema. Staff is concerned that pt is sleepy. -Echo 02/08/17 EF 55-60% with no RWMA, PA peak pressure 42 mm Hg, Bioprosthetic aortic valve gradient has   increased from 10 mmHg to 12 mmHg -Home meds include aldactone 25 mg, coreg 3.125, demadex 50 mg. -Currently he is receiving carvedilol 3.125 mg bid, furosemide 40 mg daily.  He had received lasix 40 mg IV q12h while in hospital. -Last labs on 8/24 showed normal renal function with SCr 0.76. He had  renal injury on 8/8 with SCr 1.84, this resolved and SCr improved to normal by 8/18. -Lungs with decreased air movement on exam. CXR yesterday showed bibasilar opacity with low volumes favoring atelectasis. Infection or aspiration is not excluded. Cardiomegaly without failure -Check basic metabolic panel, BNP -Will increase  Lasix 40 mg to bid -Check ABG and repeat chest xray tomorrow. -Consider reevaluating his swallowing.   Chronic Atrial fibrillation  -Heart rate controlled on coreg.  -CHA2DS2/VAS Stroke Risk Score is 8 (CHF, vasc dz, HTN, Age (2), DM, stroke (2)). He is anticoagulated with apixaban 5 mg bid  Recent CVA after colon surgery -Anticoagulation for afib was interrupted briefly. The patient was also found to have severe right ICA stenosis. He is now anticoagulated with Eliquis.  Carotid artery stenosis -Carotid dopplers show severe right ICA stenosis, 80-99%. Will need to be followed up on.  CAD status post CABG 2006 -No angina. Normal Myovue in 2012.  Hx of pericardial AVR at the time of CABG in 2006 -No murmur. Echo 02/08/17 showed normal valve function with Bioprosthetic aortic valve gradient  has   increased from 10 mmHg to 12 mmHg  Hypertension -Blood pressure is reasonably controlled  Hyperlipidemia -LDL 45 on 02/11/17, at goal <70 -Continue Atorvastatin 20 mg   Signed, Daune Perch, NP  02/20/2017 10:28 AM  Patient examined chart reviewed Patient well known to me from admission at [facility] before transfer for colon cancer surgery. Exam with obese white male doing rehab. He indicates feeling fine. He has abnormal lung exam with poor air movement and rhonchi right greater than left and SEM through AVR no AR. He has plus one edema chronic with stasis post colon surgery with ostomy. He has had fluctuating mental status even before surgery with component of encephalopathy and dementia made worse by recent stroke. CXR yesterday does not show CHF, weight is stable and  LE edema not different. He is in chronic afib and rates are fine. Will check BNP and ABG. Would repeat CXR in am and concern for aspiration Continue eliquis for stroke prevention given afib. However he also has tight proximal right ICA stenosis and will need f/u with Dr Donnetta Hutching to consider CEA after recovery. Not clear if etiology of stroke was from afib off anticoagulation or ICA stenosis . He has no evidence of ischemic symptoms or chest pain. EF on recent echo was normal and AVR functioning normally . Change lasix to bid   .Jenkins Rouge

## 2017-02-20 NOTE — Progress Notes (Signed)
SLP Cancellation Note  Patient Details Name: MORY HERRMAN MRN: [id number] DOB: [birth year]   Cancelled treatment:       Patient missed 45 minutes of skilled SLP intervention due to increased fatigue, decreased alertness and fluctuating O2 saturations and HR. RN and PA aware. Patient left with RN. Continue with current plan of care.                                                                                               Shay Jhaveri 02/20/2017, 4:33 PM

## 2017-02-20 NOTE — Progress Notes (Signed)
Occupational Therapy Session Note  Patient Details  Name: Terry Wu MRN: [id number] Date of Birth: 1931-09-10  Today's Date: 02/20/2017 OT Individual Time: 4098-1191 OT Individual Time Calculation (min): 56 min   Short Term Goals: Week 1:  OT Short Term Goal 1 (Week 1): Pt will stand at sink to groom wiht supervision OT Short Term Goal 2 (Week 1): Pt will thread BLE into pants using AE PRN OT Short Term Goal 3 (Week 1): Pt will sit to stnad wiht MIN A in prep for clohting managment OT Short Term Goal 4 (Week 1): Pt will complete walk in shower transfer with min A OT Short Term Goal 5 (Week 1): Pt will transfer to BSC/toilet wiht min A  Skilled Therapeutic Interventions/Progress Updates:    Pt very lethargic sitting in wc upon OT arrival. Pt initially difficult to arouse, but eventually able to keep eyes open with environmental changes to participate. Pt ambulated 3 feet w/ RW and stood at the sink to brush teeth for 2 mins in standing. Pt extremely short of breath after activity requiring rest break and deep breathing techniques. SpO2 checked and pt sats at 78 HR 80. With extended rest, pt recovered to 93+. Tried standing again with SpO2 checked throughout and pt able to maintain SpO2 initially above 90 while standing for 1 minute, but delayed desat of 80. HR also all over the place from 30 to 90 within standing tasks.  Applied 2 L of O2 for activity. Pt continued to be extremely lethargic, falling asleep mid sentence and jerking when falling asleep. Multimodal cues to try to keep eyes open. Worked on UB strengthening with arm raises, wrist/hand/elbow exercises and pt falling asleep again while completed exercises. L UE continues to be swollen and utilized hand/wrist exercises and retrograde massage to L UE. OT returned pt to bed with min A ambulating with RW for wound care. Mod A to return to semi-reclined in bed with increased SOB. Once at rest, supplemental oxygen removed and pt's SpO2 above  90. Informed RN of pt status.   Therapy Documentation Precautions:  Precautions Precautions: Fall Precaution Comments: deconditioning Restrictions Weight Bearing Restrictions: No Pain:  denies pain  See Function Navigator for Current Functional Status.   Therapy/Group: Individual Therapy  Valma Cava 02/20/2017, 9:30 AM

## 2017-02-20 NOTE — Progress Notes (Signed)
Physical Therapy Session Note  Patient Details  Name: Terry Wu MRN: [id number] Date of Birth: [birth year]  Today's Date: 02/20/2017 PT Individual Time: 1300-1350 PT Individual Time Calculation (min): 50 min   Short Term Goals: Week 1:  PT Short Term Goal 1 (Week 1): Pt will ambulate 76' with LRAD and min assist PT Short Term Goal 2 (Week 1): Pt will initiate stair negotiation with PT PT Short Term Goal 3 (Week 1): Pt will complete BERG Balance Scale  Skilled Therapeutic Interventions/Progress Updates:    pt sleeping soundly in chair on arrival, does not initially wake to voice or tactile cues.  Requires max multimodal cues and increased time (>5 minutes) to wake and maintain eyes open.  Pt continues to be lethargic throughout therapy sessions, requiring constant cues to stay awake.  O2 on room air 84-95%.  Session focus on activity tolerance and self care.  Gait training x75' with RW and supervision.  During ambulation pt stops and says he needs to use the bathroom, but when PT directed pt back to room, he states he would just go there since he had on depends.  Noted to saturate his depends through his pants and onto w/c cushion.  Pt states that he doesn't need to change his wet clothes just yet.  PT provided education on hygiene and infection control and pt agreed to return to room.  Standing tolerance and balance with total assist for clothing management and LB re-dressing.  Pt performs anterior peri-care with set up assist but requires total assist for posterior peri-care.  PT switched pt's cushion to a water proof cushion and pt repositioned in w/c with call bell in reach and needs met.   Therapy Documentation Precautions:  Precautions Precautions: Fall Precaution Comments: deconditioning Restrictions Weight Bearing Restrictions: No General: PT Amount of Missed Time (min): 10 Minutes PT Missed Treatment Reason: Other (Comment);Patient fatigue (O2 flucutating between 85-97%, HR  between 32-130)   See Function Navigator for Current Functional Status.   Therapy/Group: Individual Therapy  Michel Santee 02/20/2017, 2:55 PM

## 2017-02-20 NOTE — Plan of Care (Signed)
Problem: RH Dressing Goal: LTG Patient will perform lower body dressing w/assist (OT) LTG: Patient will perform lower body dressing with assist, with/without cues in positioning using equipment (OT)  Goal downgraded 8/29

## 2017-02-20 NOTE — Progress Notes (Signed)
Rapid response nurse has been call due to fact that the pt. been unstable.Pt. Has  verbalized that he is extremely tired today,unable to keep up with his therapy schedule.HR has been bouncing between 130's and 40's. Pt.'s oxygenation between 70's and 90's.BLE edema more indent today +2.BUE edema +2-3.Keep monitoring pt. Closely and assessing his needs.

## 2017-02-20 NOTE — Progress Notes (Signed)
Patient sitting up in wheel chair, states he feels weak and tired today.  3+ pitting edema in hands and legs.  Crackles and exp wheezes in bases.  Bp 129/77  HR 82 irregular,  RR 28  O2 sat 96 on RA.  Per Cards orders earlier today, lasix increased to BID and plan repeat chest xray in am.  Will continue to follow

## 2017-02-20 NOTE — Progress Notes (Signed)
Social Work Patient ID: Terry Wu, male   DOB: 09/27/1931, 83 y.o.   MRN: [id number] Terry Wu Social Worker Signed   Patient Care Conference Date of Service: 02/20/2017 12:52 PM      Hide copied text Hover for attribution information Inpatient RehabilitationTeam Conference and Plan of Care Update Date: 02/20/2017   Time: 11:25 AM      Patient Name: Terry Wu      Medical Record Number: [id number]  Date of Birth: 07/07/1931 Sex: Male         Room/Bed: 4M03C/4M03C-01 Payor Info: Payor: MEDICARE / Plan: MEDICARE PART A AND B / Product Type: *No Product type* /     Admitting Diagnosis: CVA Debility  Admit Date/Time:  02/14/2017  3:27 PM Admission Comments: No comment available    Primary Diagnosis:  Debility Principal Problem: Debility       Patient Active Problem List    Diagnosis Date Noted  . Left middle cerebral artery stroke ([place]) 02/14/2017  . Debility 02/14/2017  . Cerebral embolism with cerebral infarction 02/10/2017  . Colostomy in place [facility])    . Persistent atrial fibrillation ([place])    . Acute respiratory failure ([place])    . Acute respiratory failure with hypoxia ([place])    . S/P abdominal surgery, follow-up exam    . Preoperative cardiovascular examination    . Chronic diastolic heart failure ([place]) 02/01/2017  . Dehydration 02/01/2017  . Acute kidney injury ([place]) 02/01/2017  . Diarrhea 01/30/2017  . Gait difficulty 08/22/2015  . Dizziness 08/22/2015  . Chronic atrial fibrillation ([place]) 08/22/2015  . Aortic aneurysm ([place]) 02/01/2014  . CAP (community acquired pneumonia) 02/12/2013  . Hyponatremia 02/12/2013  . COPD (chronic obstructive pulmonary disease) ([place]) 02/12/2013  . Bilateral leg edema 02/12/2013  . DIZZINESS 01/26/2010  . SHORTNESS OF BREATH 10/29/2008  . Diabetes type 2, controlled ([place]) 07/23/2008  . Mixed hyperlipidemia 07/23/2008  . HYPERTENSION, BENIGN 07/23/2008  . ESOPHAGEAL REFLUX 07/23/2008  . S/P AVR 07/23/2008  .  CORONARY ARTERY BYPASS GRAFT, HX OF 07/23/2008      Expected Discharge Date: Expected Discharge Date: 02/27/17   Team Members Present: Physician leading conference: Dr. Alysia Penna Social Worker Present: Ovidio Kin, LCSW Nurse Present: Rayetta Humphrey, RN PT Present: Dwyane Dee, PT OT Present: Cherylynn Ridges, OT SLP Present: Windell Moulding, SLP PPS Coordinator present : Daiva Nakayama, RN, CRRN       Current Status/Progress Goal Weekly Team Focus  Medical     Patient on antibiotics, no respiratory distress, cardiac arrhythmia  Maintaining medical stability on rehabilitation unit  Address variable heart rate   Bowel/Bladder     Incontinent  Keep patient clean and dry.  Monitor for incontinence and clean and dry as needed.   Swallow/Nutrition/ Hydration     Dys. 3 textures with thin liquids via cup, intermittent supervision   Mod I  trials of upgraded textures and thin liquids with straw    ADL's     Mod/max A LB dressing, min gaurd A transfers  Supervision/Mod I/ Mod A LB dressing  activity tolerance, endurance, modified bathing/dressin, pt/family ed   Mobility     Min guard to supervision for transfers and gait up to 123' w/ RW  supervision/Mod I  endurance, general strength, tolerance to OOB activity, independence w/ functional mobility   Communication     Supervision-Mod I  supervision  word-finding    Safety/Cognition/ Behavioral Observations   Min A  Supervision-Min A  problem solving,  recall, awareness    Pain     No c/o pain.  <3.  Monitor for pain and treat as needed.   Skin     Midline surgical inscion with dehisence and MASD on scrotum and peritoneal area.  Healing of midline inscion and peritoneal MASD.  Monitor surgical inscion for infection and change dressing as ordered. Keep patient clean and dry.     *See Care Plan and progress notes for long and short-term goals.      Barriers to Discharge   Current Status/Progress Possible Resolutions Date Resolved    Physician     Medical stability     Progressing towards goals  Reconsult cardiology to evaluate      Nursing                 PT  Inaccessible home environment;Home environment access/layout;Decreased caregiver support  Split-level home requires stair negotiation              OT                 SLP            SW              Discharge Planning/Teaching Needs:  Home with wife who can provide supervision level, needs to come in for education prior to DC home      Team Discussion:  Cardiliogist seeing today due to heart issues and SOB. IV antibiotics switched to po today. Having word finding issues-Speech made aware of. Goals supervision-mod/i level needs to reach these goals due to wife can not provide more assist than this level.  Revisions to Treatment Plan:  DC 9/5    Continued Need for Acute Rehabilitation Level of Care: The patient requires daily medical management by a physician with specialized training in physical medicine and rehabilitation for the following conditions: Daily direction of a multidisciplinary physical rehabilitation program to ensure safe treatment while eliciting the highest outcome that is of practical value to the patient.: Yes Daily medical management of patient stability for increased activity during participation in an intensive rehabilitation regime.: Yes Daily analysis of laboratory values and/or radiology reports with any subsequent need for medication adjustment of medical intervention for : Cardiac problems;Neurological problems;Pulmonary problems   Elease Hashimoto 02/20/2017, 12:52 PM

## 2017-02-20 NOTE — Progress Notes (Signed)
Social Work Patient ID: Terry Wu, male   DOB: 1931/11/30, 83 y.o.   MRN: [id number]  Met with pt to discuss team conference goals mod/i-supervision level and target discharge date 9/5. He seems to be somewhat out of it today, not answering as quickly and delayed in his responses. MD made aware and according to therapists he was SOB in his therapies. Now on O2 and cardiologist seeing. Wife to be back on Friday doing something with another home-selling it. Work on discharge needs and education prior to discharge home

## 2017-02-21 ENCOUNTER — Inpatient Hospital Stay (HOSPITAL_COMMUNITY): Payer: Medicare Other | Admitting: Occupational Therapy

## 2017-02-21 ENCOUNTER — Inpatient Hospital Stay (HOSPITAL_COMMUNITY): Payer: Medicare Other | Admitting: Physical Therapy

## 2017-02-21 ENCOUNTER — Inpatient Hospital Stay (HOSPITAL_COMMUNITY): Payer: Medicare Other | Admitting: Speech Pathology

## 2017-02-21 ENCOUNTER — Inpatient Hospital Stay (HOSPITAL_COMMUNITY): Payer: Medicare Other

## 2017-02-21 DIAGNOSIS — R4182 Altered mental status, unspecified: Secondary | ICD-10-CM

## 2017-02-21 LAB — GLUCOSE, CAPILLARY
GLUCOSE-CAPILLARY: 116 mg/dL — AB (ref 65–99)
GLUCOSE-CAPILLARY: 128 mg/dL — AB (ref 65–99)
Glucose-Capillary: 126 mg/dL — ABNORMAL HIGH (ref 65–99)
Glucose-Capillary: 94 mg/dL (ref 65–99)

## 2017-02-21 LAB — URINALYSIS, ROUTINE W REFLEX MICROSCOPIC
Bilirubin Urine: NEGATIVE
Glucose, UA: NEGATIVE mg/dL
Hgb urine dipstick: NEGATIVE
KETONES UR: NEGATIVE mg/dL
LEUKOCYTES UA: NEGATIVE
NITRITE: NEGATIVE
PROTEIN: NEGATIVE mg/dL
Specific Gravity, Urine: 1.013 (ref 1.005–1.030)
pH: 6 (ref 5.0–8.0)

## 2017-02-21 MED ORDER — SODIUM CHLORIDE 0.9 % IV SOLN
3.0000 g | Freq: Four times a day (QID) | INTRAVENOUS | Status: DC
  Administered 2017-02-21 – 2017-02-23 (×8): 3 g via INTRAVENOUS
  Filled 2017-02-21 (×10): qty 3

## 2017-02-21 NOTE — Progress Notes (Signed)
Progress Note  Patient Name: Terry Wu Date of Encounter: 02/21/2017  Primary Cardiologist: Forestine Na  Subjective   No complaints just back from X ray  Inpatient Medications    Scheduled Meds: . apixaban  5 mg Oral BID  . atorvastatin  20 mg Oral q1800  . carvedilol  3.125 mg Oral BID WC  . dorzolamide-timolol  1 drop Both Eyes BID  . furosemide  40 mg Oral BID  . glimepiride  1 mg Oral Q breakfast  . insulin aspart  0-15 Units Subcutaneous TID WC  . insulin aspart  0-5 Units Subcutaneous QHS  . pantoprazole  40 mg Oral Q1200  . polyethylene glycol  17 g Oral Daily   Continuous Infusions: . ampicillin-sulbactam (UNASYN) IV     PRN Meds: ondansetron **OR** ondansetron (ZOFRAN) IV, sorbitol   Vital Signs    Vitals:   02/20/17 1613 02/20/17 1626 02/21/17 0500 02/21/17 0600  BP: (!) 155/65 129/77 (!) 165/76   Pulse: (!) 34 82 88   Resp: (!) 27 (!) 28 (!) 24   Temp:  98.2 F (36.8 C) 98.9 F (37.2 C)   TempSrc:   Oral   SpO2: (!) 88% 96% 96%   Weight:    240 lb 6.4 oz (109 kg)  Height:        Intake/Output Summary (Last 24 hours) at 02/21/17 4782 Last data filed at 02/20/17 9562  Gross per 24 hour  Intake              240 ml  Output                0 ml  Net              240 ml   Filed Weights   02/19/17 0500 02/20/17 0500 02/21/17 0600  Weight: 240 lb 1.3 oz (108.9 kg) 243 lb 11.2 oz (110.5 kg) 240 lb 6.4 oz (109 kg)    Telemetry    Afib good rate control no long pauses - Personally Reviewed  ECG    Afib nonspecific ST changes  - Personally Reviewed  Physical Exam  Elderly obese white male  GEN: No acute distress.   Neck: No JVD Cardiac: RRR,SEM through AVR  murmurs, rubs, or gallops.  Respiratory: Clear to auscultation bilaterally. GI: Soft, nontender, non-distended colostomy  MS: No edema; No deformity. Neuro:  Some word finding difficulty Psych: Normal affect  Mild bilateral edema with stasis chronic   Labs    Chemistry Recent  Labs Lab 02/15/17 0546 02/20/17 1157  NA 136 136  K 3.9 3.6  CL 98* 101  CO2 29 29  GLUCOSE 158* 134*  BUN 7 5*  CREATININE 0.76 0.79  CALCIUM 8.6* 8.6*  PROT 5.8*  --   ALBUMIN 2.5*  --   AST 26  --   ALT 25  --   ALKPHOS 26*  --   BILITOT 1.0  --   GFRNONAA >60 >60  GFRAA >60 >60  ANIONGAP 9 6     Hematology Recent Labs Lab 02/15/17 0546  WBC 7.9  RBC 3.10*  HGB 9.6*  HCT 29.7*  MCV 95.8  MCH 31.0  MCHC 32.3  RDW 13.8  PLT 222    Cardiac EnzymesNo results for input(s): TROPONINI in the last 168 hours. No results for input(s): TROPIPOC in the last 168 hours.   BNP Recent Labs Lab 02/20/17 1157  BNP 327.9*     DDimer No results for input(s):  DDIMER in the last 168 hours.   Radiology    Dg Chest 1 View  Result Date: 02/19/2017 CLINICAL DATA:  Shortness of breath at rest EXAM: CHEST 1 VIEW COMPARISON:  02/08/2017 FINDINGS: Low volume chest with hazy densities at the bases, right more than left. Appearance is similar to prior. There is chronic cardiomegaly and vascular pedicle widening. Status post CABG. IMPRESSION: 1. Bibasilar opacity with low volumes favoring atelectasis. Infection or aspiration is not excluded. 2. Cardiomegaly without failure. Electronically Signed   By: Monte Fantasia M.D.   On: 02/19/2017 15:32   Dg Chest 2 View  Result Date: 02/21/2017 CLINICAL DATA:  Hypoxia, coronary artery disease status post CABG. History of previous CVA, diabetes, aortic valve replacement. EXAM: CHEST  2 VIEW COMPARISON:  Portable chest x-ray of February 19, 2017 FINDINGS: The right lung is adequately inflated. The lung markings at the right lung base have improved. There is mild volume loss at the left lung base, but the left hemidiaphragm is better demonstrated today. The cardiac silhouette remains enlarged. The central pulmonary vascularity is less engorged. The pulmonary interstitial markings are less prominent. There are post CABG and aortic valve replacement  changes. There is dense calcification in the wall of the thoracic aorta. IMPRESSION: Stable cardiomegaly. Slight decreased conspicuity of the pulmonary vascularity and pulmonary interstitial markings. Improving bibasilar atelectasis. Thoracic aortic atherosclerosis. Electronically Signed   By: Ladarian  Martinique M.D.   On: 02/21/2017 08:00    Cardiac Studies   Echo 02/08/17 EF 60-65% mean gradient across AV stable 10 mmHg no AR  Patient Profile     83 y.o. male with colon cancer post resection Chronic afib and post 25 mm pericardial bioprosthetic valve  Post op CVA with tight right ICA stenosis Lethargy and poor sats at rehab  Assessment & Plan    1) CHF:  Mild lasix adjusted CXR this am improved just mild atelectasis BNP minimally elevated 2) Afib good rate control no long pauses continue eliquis 3) CVA ? From afib and holding anticoagulation for surgery vs tight right ICA stenosis will need outpatient f/u with [facility] Dr Donnetta Hutching 4) CABG/CAD:  No angina stable continue corge  Needs continued pulmonary toilet and rehab cardiac status stable   Signed, Jenkins Rouge, MD  02/21/2017, 8:12 AM

## 2017-02-21 NOTE — Progress Notes (Signed)
Speech Language Pathology Daily Session Note  Patient Details  Name: Terry Wu MRN: [id number] Date of Birth: 09/25/1931  Today's Date: 02/21/2017 SLP Individual Time: 1100-1130 SLP Individual Time Calculation (min): 30 min and Today's Date: 02/21/2017 SLP Missed Time: 30 Minutes Missed Time Reason: Patient fatigue  Short Term Goals: Week 1: SLP Short Term Goal 1 (Week 1): Patient will consume current diet with minimal overt s/s of aspiration with mod I for use of swallowing compensatory strategies. SLP Short Term Goal 2 (Week 1): Patient will demonstrate efficient mastication with complete oral clearance of regular textures without overt s/s of aspiration over 2 sessions prior to upgrade.  SLP Short Term Goal 3 (Week 1): Patient will self-monitor and correct verbal errors at the sentence level with Min A verbal cues.  SLP Short Term Goal 4 (Week 1): Patient will demonstrate functional problem solving for basic and familiar tasks with Min A verbal cues.  SLP Short Term Goal 5 (Week 1): Patient will recall new, daily information with Min A verbal and visual cues.  SLP Short Term Goal 6 (Week 1): Patient will self-monitor and correct errors during functional tasks with Min A verbal cues.   Skilled Therapeutic Interventions: Skilled treatment session focused on cognitive goals. Upon arrival, patient was asleep while upright in the wheelchair. SLP facilitated session by providing Max A verbal and tactile cues to maintain arousal/alertness for 30-60 second intervals. RN and PA aware. When patient awakened, he was unable to answer basic questions without Max A verbal cues and repetition with some verbal responses unintelligible. Patient missed remaining 30 minutes of session due to fatigue.      Function:  Cognition Comprehension Comprehension assist level: Understands basic 50 - 74% of the time/ requires cueing 25 - 49% of the time  Expression   Expression assist level: Expresses basic 50 -  74% of the time/requires cueing 25 - 49% of the time. Needs to repeat parts of sentences.  Social Interaction Social Interaction assist level: Interacts appropriately 50 - 74% of the time - May be physically or verbally inappropriate.  Problem Solving Problem solving assist level: Solves basic 50 - 74% of the time/requires cueing 25 - 49% of the time  Memory Memory assist level: Recognizes or recalls 50 - 74% of the time/requires cueing 25 - 49% of the time    Pain No/Denies Pain   Therapy/Group: Individual Therapy  Carlye Panameno 02/21/2017, 4:26 PM

## 2017-02-21 NOTE — Plan of Care (Signed)
Pt's plan of care adjusted to 15/7 after speaking with care team and discussed with MD in team conference as pt currently unable to tolerate current therapy schedule with OT, PT, and SLP.    Shann Medal, PT, DPT 02/21/17 10:43 AM

## 2017-02-21 NOTE — Plan of Care (Signed)
Problem: RH BLADDER ELIMINATION Goal: RH STG MANAGE BLADDER WITH ASSISTANCE STG Manage Bladder With mod I Assistance   Outcome: Not Progressing Pt is incontinent  Problem: RH SKIN INTEGRITY Goal: RH STG MAINTAIN SKIN INTEGRITY WITH ASSISTANCE STG Maintain Skin Integrity With Mod I Assistance  Outcome: Not Progressing Pt requires assistance with reviewing skin Goal: RH STG ABLE TO PERFORM INCISION/WOUND CARE W/ASSISTANCE STG Able To Perform Incision/Wound Care With Mod Dependence   Outcome: Not Progressing Dressing is total assist at this time

## 2017-02-21 NOTE — Progress Notes (Signed)
Physical Therapy Note  Patient Details  Name: Terry Wu MRN: [id number] Date of Birth: Jan 04, 1932 Today's Date: 02/21/2017    Attempted to see pt for scheduled therapy session.  Pt asleep on arrival and unable to be aroused despite 3 minutes of constant verbal/tactile cues.  PT assessed vitals (pt did not awake even with PT moving arm to apply BP cuff or tightening of cuff).  RN aware.    BP: 89/53 HR: 42-80 bpm O2: 98% on 2L via [place] RR: 28   Michel Santee 02/21/2017, 10:26 AM

## 2017-02-21 NOTE — Progress Notes (Signed)
Physical Therapy Session Note  Patient Details  Name: Terry Wu MRN: [id number] Date of Birth: 1931-12-31  Today's Date: 02/21/2017 PT Individual Time: 1520-1550 PT Individual Time Calculation (min): 30 min   Short Term Goals: Week 1:  PT Short Term Goal 1 (Week 1): Pt will ambulate 51' with LRAD and min assist PT Short Term Goal 2 (Week 1): Pt will initiate stair negotiation with PT PT Short Term Goal 3 (Week 1): Pt will complete BERG Balance Scale  Skilled Therapeutic Interventions/Progress Updates:    no c/o pain.  Session focus on activity tolerance and pt education.    PT instructed pt in 3x10 reps of incentive spirometer (max 300), and 3x10 reps acapella flutter valve.  Pt requires max multimodal cues for correct use of each device.  Pt noted to have wet pants and puddle under chair.  PT asked pt if he called for assistance to change wet depends after he'd used them and he states that he does not call, he just continues to wear the wet one and use it again if he needs to.  PT provided education on hygiene and that depends must be changed after each use, that they would eventually become saturated and not hold anymore urine, leading to his frequently soaked clothes and soiled w/c cushions.  While PT was cleaning urine from floor to allow pt to walk to bathroom and change into clean clothes, he voided again, urine leaking further onto pants/wheelchair/floor.  Pt requires max assist for clothing management and hygiene in bathroom.  Returned to recliner for improved/safer positioning should pt doze off in chair.  Call bell in reach and needs met.  RN notified of pt's incontinence vs. Motivation vs. Cognitive deficits regarding toileting and hygiene.    Therapy Documentation Precautions:  Precautions Precautions: Fall Precaution Comments: deconditioning Restrictions Weight Bearing Restrictions: No   See Function Navigator for Current Functional Status.   Therapy/Group: Individual  Therapy  Michel Santee 02/21/2017, 4:18 PM

## 2017-02-21 NOTE — Progress Notes (Signed)
Occupational Therapy Session Note  Patient Details  Name: Terry Wu MRN: [id number] Date of Birth: [birth year]  Today's Date: 02/21/2017 OT Individual Time: 0802-0900 OT Individual Time Calculation (min): 58 min   Short Term Goals: Week 1:  OT Short Term Goal 1 (Week 1): Pt will stand at sink to groom wiht supervision OT Short Term Goal 2 (Week 1): Pt will thread BLE into pants using AE PRN OT Short Term Goal 3 (Week 1): Pt will sit to stnad wiht MIN A in prep for clohting managment OT Short Term Goal 4 (Week 1): Pt will complete walk in shower transfer with min A OT Short Term Goal 5 (Week 1): Pt will transfer to BSC/toilet wiht min A  Skilled Therapeutic Interventions/Progress Updates:    OT treatment session focused on activity tolerance and modified bathing/dressing. Pt continues to be lethargic today, but improved SOB noted with activity. Pt remained on 2L of O2 throughout session with SpO2 remaining above 94%. Pt asleep upon OT arrival seated in wc, able to wake pt with tactile and verbal cues. Pt ambulated to the sink w/ RW and close supervision. Tolerated 5 more mins in standing to urinate in urinal. Pt needed encouragement to try urinal placement, and was able to do so using B UEs and maintaining standing balance with supervision and intermittent min guard A for slight posterior LOB. RN informed of urine sample. Worked on LB bathing/dressing using adaptive equipment. Pt initially stated "my wife will help with that" but reiterated the importance of pt doing as much as he can on his own to decrease the burden of care on his wife and pt seemed to understand this and was much more willing to try to do as much self-care on his own as he could. Pt used reacher to doff pants with min A and then bathed LB using long-handled sponge and set-up A. Demonstrated functions of reacher and pt able to thread pant legs with supervision today and stand to pull pants up with supervision as well. Pt needed  assistance to don socks 2/2 heel protection pads. Pt left seated in wc at end of session with needs met.   Therapy Documentation Precautions:  Precautions Precautions: Fall Precaution Comments: deconditioning Restrictions Weight Bearing Restrictions: No Pain:  none/denies pain  See Function Navigator for Current Functional Status.   Therapy/Group: Individual Therapy  Valma Cava 02/21/2017, 12:33 PM

## 2017-02-21 NOTE — Progress Notes (Addendum)
Start Time/Call Time 0423  Follow up:  Came by to see patient after being seen earlier by RR RN.  Patient was sleeping when I walked, arouse quickly, neuro intact, drowsy.  Mild labored breathing, mild tachypnea associated with some shortness of breath, all of which patient has been exhibiting for a few days.  Lung sounds diminished, L>R. Air movement present bilaterally but diminished.  I had the patient use the incentive spirometer and patient only achieved 250, also had the patient's use acapella flutter valve. + 3 edema bilateral lower extremities.   Instructed patient on the importance of pulmonary toileting, his effort is poor with both devices.  Patient was placed on 2L [place] by primary because initially oxygen saturations in the 80s.   Plan: Leave supplemental oxygen on as needed to maintain sats, patient already has 2V chest xray ordered for 0700, instructed RN to have oncoming providers re-evaluate patient and his medical needs.   Currently vital signs are stable and patient is not in acute distress or respiratory distress. History of HF, AF, + Eliquis and lasix as well.  Will follow as needed.   End Time 515

## 2017-02-21 NOTE — Progress Notes (Signed)
Subjective/Complaints:  Up in chair , O2 per [place], mouth breathing, reduced level of responsiveness Appreciate cardiology consult ROS- no CP, SOB, N/V/D  Objective: Vital Signs: Blood pressure (!) 165/76, pulse 88, temperature 98.9 F (37.2 C), temperature source Oral, resp. rate (!) 24, height '5\' 9"'$  (1.753 m), weight 109 kg (240 lb 6.4 oz), SpO2 96 %. Dg Chest 1 View  Result Date: 02/19/2017 CLINICAL DATA:  Shortness of breath at rest EXAM: CHEST 1 VIEW COMPARISON:  02/08/2017 FINDINGS: Low volume chest with hazy densities at the bases, right more than left. Appearance is similar to prior. There is chronic cardiomegaly and vascular pedicle widening. Status post CABG. IMPRESSION: 1. Bibasilar opacity with low volumes favoring atelectasis. Infection or aspiration is not excluded. 2. Cardiomegaly without failure. Electronically Signed   By: Monte Fantasia M.D.   On: 02/19/2017 15:32   Results for orders placed or performed during the hospital encounter of 02/14/17 (from the past 72 hour(s))  Glucose, capillary     Status: Abnormal   Collection Time: 02/18/17 11:54 AM  Result Value Ref Range   Glucose-Capillary 141 (H) 65 - 99 mg/dL  Glucose, capillary     Status: Abnormal   Collection Time: 02/18/17  4:35 PM  Result Value Ref Range   Glucose-Capillary 176 (H) 65 - 99 mg/dL  Glucose, capillary     Status: Abnormal   Collection Time: 02/18/17  8:40 PM  Result Value Ref Range   Glucose-Capillary 198 (H) 65 - 99 mg/dL  Glucose, capillary     Status: Abnormal   Collection Time: 02/19/17  6:53 AM  Result Value Ref Range   Glucose-Capillary 171 (H) 65 - 99 mg/dL  Glucose, capillary     Status: Abnormal   Collection Time: 02/19/17 11:48 AM  Result Value Ref Range   Glucose-Capillary 154 (H) 65 - 99 mg/dL  Glucose, capillary     Status: Abnormal   Collection Time: 02/19/17  4:33 PM  Result Value Ref Range   Glucose-Capillary 177 (H) 65 - 99 mg/dL  Glucose, capillary     Status: Abnormal   Collection Time: 02/19/17  9:21 PM  Result Value Ref Range   Glucose-Capillary 156 (H) 65 - 99 mg/dL  Glucose, capillary     Status: Abnormal   Collection Time: 02/20/17  6:31 AM  Result Value Ref Range   Glucose-Capillary 152 (H) 65 - 99 mg/dL  Glucose, capillary     Status: Abnormal   Collection Time: 02/20/17 11:33 AM  Result Value Ref Range   Glucose-Capillary 128 (H) 65 - 99 mg/dL  Basic metabolic panel     Status: Abnormal   Collection Time: 02/20/17 11:57 AM  Result Value Ref Range   Sodium 136 135 - 145 mmol/L   Potassium 3.6 3.5 - 5.1 mmol/L   Chloride 101 101 - 111 mmol/L   CO2 29 22 - 32 mmol/L   Glucose, Bld 134 (H) 65 - 99 mg/dL   BUN 5 (L) 6 - 20 mg/dL   Creatinine, Ser 0.79 0.61 - 1.24 mg/dL   Calcium 8.6 (L) 8.9 - 10.3 mg/dL   GFR calc non Af Amer >60 >60 mL/min   GFR calc Af Amer >60 >60 mL/min    Comment: (NOTE) The eGFR has been calculated using the CKD EPI equation. This calculation has not been validated in all clinical situations. eGFR's persistently <60 mL/min signify possible Chronic Kidney Disease.    Anion gap 6 5 - 15  Brain natriuretic peptide  Status: Abnormal   Collection Time: 02/20/17 11:57 AM  Result Value Ref Range   B Natriuretic Peptide 327.9 (H) 0.0 - 100.0 pg/mL  Blood gas, arterial     Status: Abnormal   Collection Time: 02/20/17  3:31 PM  Result Value Ref Range   FIO2 21.00    pH, Arterial 7.445 7.350 - 7.450   pCO2 arterial 41.9 32.0 - 48.0 mmHg   pO2, Arterial 68.4 (L) 83.0 - 108.0 mmHg   Bicarbonate 28.3 (H) 20.0 - 28.0 mmol/L   Acid-Base Excess 4.4 (H) 0.0 - 2.0 mmol/L   O2 Saturation 93.6 %   Patient temperature 98.6    Collection site RIGHT RADIAL    Drawn by [id number]    Sample type ARTERIAL DRAW    Allens test (pass/fail) PASS PASS  Glucose, capillary     Status: Abnormal   Collection Time: 02/20/17  4:47 PM  Result Value Ref Range   Glucose-Capillary 123 (H) 65 - 99 mg/dL  Glucose, capillary     Status: Abnormal    Collection Time: 02/20/17  8:54 PM  Result Value Ref Range   Glucose-Capillary 128 (H) 65 - 99 mg/dL  Glucose, capillary     Status: Abnormal   Collection Time: 02/21/17  6:40 AM  Result Value Ref Range   Glucose-Capillary 126 (H) 65 - 99 mg/dL   Comment 1 Notify RN      HEENT: normal Cardio: RRR and no murmur Resp: CTA B/L and decreased BS at bases GI: BS positive and + colostomy and abd wound, BS positive and Non tender non distended Extremity: stasis dermatitis BLE Skin:   Wound midline abd wound with packing, RLQ colostomy Neuro: Flat and Abnormal Motor 4/5 BUE and BLE Musc/Skel:  Normal   Assessment/Plan: 1. Functional deficits secondary to debility which require 3+ hours per day of interdisciplinary therapy in a comprehensive inpatient rehab setting. Physiatrist is providing close team supervision and 24 hour management of active medical problems listed below. Physiatrist and rehab team continue to assess barriers to discharge/monitor patient progress toward functional and medical goals. FIM: Function - Bathing Position: Wheelchair/chair at sink Body parts bathed by patient: Right arm, Left arm, Chest, Abdomen, Front perineal area Body parts bathed by helper: Back, Left lower leg, Right lower leg, Left upper leg, Right upper leg Bathing not applicable: Buttocks Assist Level:  (Mod A)  Function- Upper Body Dressing/Undressing What is the patient wearing?: Pull over shirt/dress Pull over shirt/dress - Perfomed by patient: Thread/unthread right sleeve, Thread/unthread left sleeve, Put head through opening, Pull shirt over trunk Pull over shirt/dress - Perfomed by helper: Pull shirt over trunk Assist Level: Supervision or verbal cues Function - Lower Body Dressing/Undressing What is the patient wearing?: Underwear, Pants, Non-skid slipper socks Position: Wheelchair/chair at sink Underwear - Performed by patient: Pull underwear up/down Underwear - Performed by helper:  Thread/unthread right underwear leg, Thread/unthread left underwear leg Pants- Performed by patient: Pull pants up/down Pants- Performed by helper: Thread/unthread right pants leg, Thread/unthread left pants leg Non-skid slipper socks- Performed by helper: Don/doff right sock, Don/doff left sock Assist for footwear: Dependant Assist for lower body dressing:  (patient completes 1/5 steps, 20%, total assist level)  Function - Toileting Toileting activity did not occur: No continent bowel/bladder event Toileting steps completed by patient: Performs perineal hygiene (No clothing to adjust) Toileting steps completed by helper: Adjust clothing after toileting Toileting Assistive Devices: Grab bar or rail Assist level: Touching or steadying assistance (Pt.75%)  Function - Toilet  Transfers Toilet transfer assistive device: Elevated toilet seat/BSC over toilet, Grab bar Assist level to toilet: Touching or steadying assistance (Pt > 75%) Assist level from toilet: Touching or steadying assistance (Pt > 75%)  Function - Chair/bed transfer Chair/bed transfer method: Ambulatory Chair/bed transfer assist level: Touching or steadying assistance (Pt > 75%) Chair/bed transfer assistive device: Armrests, Walker Chair/bed transfer details: Verbal cues for technique, Verbal cues for precautions/safety, Verbal cues for safe use of DME/AE, Verbal cues for sequencing  Function - Locomotion: Wheelchair Will patient use wheelchair at discharge?: No Type: Manual Max wheelchair distance: 61' Assist Level: Supervision or verbal cues Assist Level: Supervision or verbal cues Function - Locomotion: Ambulation Assistive device: Walker-rolling Max distance: 75 ft Assist level: Supervision or verbal cues Assist level: Supervision or verbal cues Walk 50 feet with 2 turns activity did not occur: Safety/medical concerns Assist level: Supervision or verbal cues Walk 150 feet activity did not occur: Safety/medical  concerns Walk 10 feet on uneven surfaces activity did not occur: Safety/medical concerns  Function - Comprehension Comprehension: Auditory Comprehension assist level: Follows basic conversation/direction with no assist  Function - Expression Expression: Verbal Expression assist level: Expresses basic needs/ideas: With extra time/assistive device  Function - Social Interaction Social Interaction assist level: Interacts appropriately 50 - 74% of the time - May be physically or verbally inappropriate.  Function - Problem Solving Problem solving assist level: Solves basic 25 - 49% of the time - needs direction more than half the time to initiate, plan or complete simple activities  Function - Memory Memory assist level: Recognizes or recalls 50 - 74% of the time/requires cueing 25 - 49% of the time Patient normally able to recall (first 3 days only): Location of own room, Current season, Staff names and faces, That he or she is in a hospital  Medical Problem List and Plan: 1. 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 DVT Prophylaxis/Anticoagulation: Eliquis 3. Pain Management: Tylenol as needed 4. Mood: Provide emotional support, he does not want to attend funeral of brother                  5. Neuropsych: This patient iscapable of making decisions on hisown behalf. 6. Skin/Wound Care: Routine skin checks 7. Fluids/Electrolytes/Nutrition: Routine I&O's will follow-up chemistry 8.ABLA. Follow-up CBC 9.Hypertension. Coreg 3.125 mg twice a day, Lasix 40 mg daily Vitals:   02/20/17 1626 02/21/17 0500  BP: 129/77 (!) 165/76  Pulse: 82 88  Resp: (!) 28 (!) 24  Temp: 98.2 F (36.8 C) 98.9 F (37.2 C)  SpO2: 96% 96%  . 10.CAD/CABG/AVR  2006. Follow-up cardiology services as needed. Continue anticoagulation- Eliquis 11.Type 2 diabetes mellitus. Hemoglobin A1c 6.4.SSI.Check blood sugars before meals and at bedtime CBG (last 3)    Recent Labs  02/20/17 1647 02/20/17 2054 02/21/17 0640  GLUCAP 123* 128* 126*   Resume glimipride controlled 8/29 12.Hyperlipidemia. Lipitor  13. Incidental finding meningioma follow-up with neurology as outpatient 14.  On IV abx for ?PNA vs intra abdominal infx, changed to oral but given his dysarthria, concerns for po will switch back to IV Unasyn   15.  Mental status changes-likely multifactorial, reviewed meds  (no sedating meds identified), has rectal CA which may cause fatigue as will CVA.  CHeck UA, recheck CXR given hx of PNA, not clearly  a cardiac issue although low flow state possible with bradycardia, his CVAs are watershed infarcts and can be susceptible to low flow in setting of  ICA stenosis, will recheck CT head LOS (Days) 7 A FACE TO FACE EVALUATION WAS PERFORMED  Ainhoa Rallo E 02/21/2017, 7:12 AM

## 2017-02-21 NOTE — Progress Notes (Signed)
CT scheduled for 12:30.  Upon assessment, pt lethargic after morning therapy, was able to participate in OT this am.  Pt difficult to arouse but mumbles with sternal rubs and will open eyes and close them again, pt sitting up right in chair with O2 at 2L via n/c SAT 98%, VS charted. Called CT and unable to get pt at this time d/t tables being full with emergencies at this time. CT will get pt as soon as possible. No c/o pain at time. Call bell w/i reach.

## 2017-02-22 ENCOUNTER — Inpatient Hospital Stay (HOSPITAL_COMMUNITY): Payer: Medicare Other | Admitting: Speech Pathology

## 2017-02-22 ENCOUNTER — Inpatient Hospital Stay (HOSPITAL_COMMUNITY): Payer: Medicare Other | Admitting: Physical Therapy

## 2017-02-22 ENCOUNTER — Inpatient Hospital Stay (HOSPITAL_COMMUNITY): Payer: Medicare Other

## 2017-02-22 DIAGNOSIS — T8149XA Infection following a procedure, other surgical site, initial encounter: Secondary | ICD-10-CM

## 2017-02-22 DIAGNOSIS — R410 Disorientation, unspecified: Secondary | ICD-10-CM

## 2017-02-22 DIAGNOSIS — R0602 Shortness of breath: Secondary | ICD-10-CM

## 2017-02-22 DIAGNOSIS — D62 Acute posthemorrhagic anemia: Secondary | ICD-10-CM

## 2017-02-22 DIAGNOSIS — I10 Essential (primary) hypertension: Secondary | ICD-10-CM

## 2017-02-22 DIAGNOSIS — T814XXS Infection following a procedure, sequela: Secondary | ICD-10-CM

## 2017-02-22 DIAGNOSIS — R0902 Hypoxemia: Secondary | ICD-10-CM

## 2017-02-22 DIAGNOSIS — E669 Obesity, unspecified: Secondary | ICD-10-CM

## 2017-02-22 DIAGNOSIS — E1169 Type 2 diabetes mellitus with other specified complication: Secondary | ICD-10-CM

## 2017-02-22 LAB — URINE CULTURE: CULTURE: NO GROWTH

## 2017-02-22 LAB — GLUCOSE, CAPILLARY
GLUCOSE-CAPILLARY: 92 mg/dL (ref 65–99)
Glucose-Capillary: 88 mg/dL (ref 65–99)
Glucose-Capillary: 104 mg/dL — ABNORMAL HIGH (ref 65–99)
Glucose-Capillary: 140 mg/dL — ABNORMAL HIGH (ref 65–99)

## 2017-02-22 NOTE — Progress Notes (Signed)
Physical Therapy Session Note  Patient Details  Name: Terry Wu MRN: [id number] Date of Birth: 1931-08-19  Today's Date: 02/21/2017 PT Individual Time:1630-1700    30 min - make up time.   Short Term Goals: Week 1:  PT Short Term Goal 1 (Week 1): Pt will ambulate 39' with LRAD and min assist PT Short Term Goal 2 (Week 1): Pt will initiate stair negotiation with PT PT Short Term Goal 3 (Week 1): Pt will complete BERG Balance Scale  Skilled Therapeutic Interventions/Progress Updates:   Pt received sitting in WC and agreeable to PT. Pt maintained on 2L/min Supplemental O2 throughout treatment.   PT instructed pt in gait training with RW x 153f and supervision assist for safety. Min cues for AD management in turns as well as pursed lip breathing to improved cardiovascular endurance.   Dynamic standing balance for reciprocal foot taps on 6 inch step, 2 x 10 BLE with prolonged rest break.   [facility] and all needs met.        Therapy Documentation Precautions:  Precautions Precautions: Fall Precaution Comments: deconditioning Restrictions Weight Bearing Restrictions: No   See Function Navigator for Current Functional Status.   Therapy/Group: Individual Therapy  ALorie Phenix8/31/2018, 7:57 AM

## 2017-02-22 NOTE — Progress Notes (Signed)
Subjective/Complaints: Pt seen laying in bed this AM.  He slept well overnight.  He is flat.   ROS: Denies CP, SOB, N/V/D  Objective: Vital Signs: Blood pressure 119/71, pulse 74, temperature 98.2 F (36.8 C), temperature source Oral, resp. rate 20, height '5\' 9"'$  (1.753 m), weight 108.5 kg (239 lb 1.6 oz), SpO2 90 %. Dg Chest 2 View  Result Date: 02/21/2017 CLINICAL DATA:  Hypoxia, coronary artery disease status post CABG. History of previous CVA, diabetes, aortic valve replacement. EXAM: CHEST  2 VIEW COMPARISON:  Portable chest x-ray of February 19, 2017 FINDINGS: The right lung is adequately inflated. The lung markings at the right lung base have improved. There is mild volume loss at the left lung base, but the left hemidiaphragm is better demonstrated today. The cardiac silhouette remains enlarged. The central pulmonary vascularity is less engorged. The pulmonary interstitial markings are less prominent. There are post CABG and aortic valve replacement changes. There is dense calcification in the wall of the thoracic aorta. IMPRESSION: Stable cardiomegaly. Slight decreased conspicuity of the pulmonary vascularity and pulmonary interstitial markings. Improving bibasilar atelectasis. Thoracic aortic atherosclerosis. Electronically Signed   By: Kewon  Martinique M.D.   On: 02/21/2017 08:00   Ct Head Wo Contrast  Result Date: 02/21/2017 CLINICAL DATA:  83 year old with acute mental status changes and recurrent dizziness. Acute left MCA distribution infarcts identified on recent MRI. EXAM: CT HEAD WITHOUT CONTRAST TECHNIQUE: Contiguous axial images were obtained from the base of the skull through the vertex without intravenous contrast. COMPARISON:  MRI brain 02/09/2017.  CT head 02/08/2017, 08/22/2015. FINDINGS: Brain: The acute stroke involving the left superior temporal lobe and anterior parietal lobe identified on the MRI 02/09/2017 manifests as slightly asymmetric low attenuation in the white matter in  these locations on the current CT. No evidence of acute hemorrhage or hematoma. No extra-axial fluid collections. No mass lesion. No midline shift. Ventricular system normal in size and appearance for age. Mild-to-moderate cortical atrophy and mild cerebellar atrophy unchanged dating back to 2017. No new abnormalities. Vascular: Extensive bilateral carotid siphon and bilateral vertebral artery atherosclerosis. Skull: No skull fracture or other focal osseous abnormality involving the skull. Sinuses/Orbits: Minimal fluid in the inferior left mastoid air cells as noted on the recent MRI. Visualized paranasal sinuses, bilateral middle ear cavities and right mastoid air cells well-aerated. Other: None. IMPRESSION: 1. No acute intracranial abnormality. 2. The acute stroke in the left superior temporal and left anterior parietal lobes identified on the MRI 02/09/2017 manifest as asymmetric white matter low attenuation in these locations on the current CT. No evidence of hemorrhagic transformation. 3. Stable mild left mastoid effusion. Electronically Signed   By: Evangeline Dakin M.D.   On: 02/21/2017 14:01   Results for orders placed or performed during the hospital encounter of 02/14/17 (from the past 72 hour(s))  Glucose, capillary     Status: Abnormal   Collection Time: 02/19/17 11:48 AM  Result Value Ref Range   Glucose-Capillary 154 (H) 65 - 99 mg/dL  Glucose, capillary     Status: Abnormal   Collection Time: 02/19/17  4:33 PM  Result Value Ref Range   Glucose-Capillary 177 (H) 65 - 99 mg/dL  Glucose, capillary     Status: Abnormal   Collection Time: 02/19/17  9:21 PM  Result Value Ref Range   Glucose-Capillary 156 (H) 65 - 99 mg/dL  Glucose, capillary     Status: Abnormal   Collection Time: 02/20/17  6:31 AM  Result Value Ref Range  Glucose-Capillary 152 (H) 65 - 99 mg/dL  Glucose, capillary     Status: Abnormal   Collection Time: 02/20/17 11:33 AM  Result Value Ref Range   Glucose-Capillary  128 (H) 65 - 99 mg/dL  Basic metabolic panel     Status: Abnormal   Collection Time: 02/20/17 11:57 AM  Result Value Ref Range   Sodium 136 135 - 145 mmol/L   Potassium 3.6 3.5 - 5.1 mmol/L   Chloride 101 101 - 111 mmol/L   CO2 29 22 - 32 mmol/L   Glucose, Bld 134 (H) 65 - 99 mg/dL   BUN 5 (L) 6 - 20 mg/dL   Creatinine, Ser 0.79 0.61 - 1.24 mg/dL   Calcium 8.6 (L) 8.9 - 10.3 mg/dL   GFR calc non Af Amer >60 >60 mL/min   GFR calc Af Amer >60 >60 mL/min    Comment: (NOTE) The eGFR has been calculated using the CKD EPI equation. This calculation has not been validated in all clinical situations. eGFR's persistently <60 mL/min signify possible Chronic Kidney Disease.    Anion gap 6 5 - 15  Brain natriuretic peptide     Status: Abnormal   Collection Time: 02/20/17 11:57 AM  Result Value Ref Range   B Natriuretic Peptide 327.9 (H) 0.0 - 100.0 pg/mL  Blood gas, arterial     Status: Abnormal   Collection Time: 02/20/17  3:31 PM  Result Value Ref Range   FIO2 21.00    pH, Arterial 7.445 7.350 - 7.450   pCO2 arterial 41.9 32.0 - 48.0 mmHg   pO2, Arterial 68.4 (L) 83.0 - 108.0 mmHg   Bicarbonate 28.3 (H) 20.0 - 28.0 mmol/L   Acid-Base Excess 4.4 (H) 0.0 - 2.0 mmol/L   O2 Saturation 93.6 %   Patient temperature 98.6    Collection site RIGHT RADIAL    Drawn by [id number]    Sample type ARTERIAL DRAW    Allens test (pass/fail) PASS PASS  Glucose, capillary     Status: Abnormal   Collection Time: 02/20/17  4:47 PM  Result Value Ref Range   Glucose-Capillary 123 (H) 65 - 99 mg/dL  Glucose, capillary     Status: Abnormal   Collection Time: 02/20/17  8:54 PM  Result Value Ref Range   Glucose-Capillary 128 (H) 65 - 99 mg/dL  Glucose, capillary     Status: Abnormal   Collection Time: 02/21/17  6:40 AM  Result Value Ref Range   Glucose-Capillary 126 (H) 65 - 99 mg/dL   Comment 1 Notify RN   Urinalysis, Routine w reflex microscopic     Status: None   Collection Time: 02/21/17  8:40 AM   Result Value Ref Range   Color, Urine YELLOW YELLOW   APPearance CLEAR CLEAR   Specific Gravity, Urine 1.013 1.005 - 1.030   pH 6.0 5.0 - 8.0   Glucose, UA NEGATIVE NEGATIVE mg/dL   Hgb urine dipstick NEGATIVE NEGATIVE   Bilirubin Urine NEGATIVE NEGATIVE   Ketones, ur NEGATIVE NEGATIVE mg/dL   Protein, ur NEGATIVE NEGATIVE mg/dL   Nitrite NEGATIVE NEGATIVE   Leukocytes, UA NEGATIVE NEGATIVE  Glucose, capillary     Status: None   Collection Time: 02/21/17 11:52 AM  Result Value Ref Range   Glucose-Capillary 94 65 - 99 mg/dL  Glucose, capillary     Status: Abnormal   Collection Time: 02/21/17  4:38 PM  Result Value Ref Range   Glucose-Capillary 116 (H) 65 - 99 mg/dL  Glucose, capillary  Status: Abnormal   Collection Time: 02/21/17  8:49 PM  Result Value Ref Range   Glucose-Capillary 128 (H) 65 - 99 mg/dL  Glucose, capillary     Status: None   Collection Time: 02/22/17  6:22 AM  Result Value Ref Range   Glucose-Capillary 88 65 - 99 mg/dL     HEENT: normocephalic, atraumatic Cardio: RRR and no JVD Resp: CTA B/L and unlabored. +[facility] GI: BS positive and + colostomy  Skin: stasis dermatitis BLE. Midline abd wound with dressing.  Neuro: Alert and oriented x3. Motor 4/5 throughout Musc/Skel:  No edema. No tenderness.  Psych: Flat. Normal behavior   Assessment/Plan: 1. Functional deficits secondary to debility which require 3+ hours per day of interdisciplinary therapy in a comprehensive inpatient rehab setting. Physiatrist is providing close team supervision and 24 hour management of active medical problems listed below. Physiatrist and rehab team continue to assess barriers to discharge/monitor patient progress toward functional and medical goals. FIM: Function - Bathing Position: Wheelchair/chair at sink Body parts bathed by patient: Right upper leg, Left upper leg, Right lower leg, Left lower leg, Buttocks, Front perineal area Body parts bathed by helper: Back, Left  lower leg, Right lower leg, Left upper leg, Right upper leg Bathing not applicable: Right arm, Left arm, Chest, Abdomen Assist Level: Touching or steadying assistance(Pt > 75%)  Function- Upper Body Dressing/Undressing What is the patient wearing?: Pull over shirt/dress Pull over shirt/dress - Perfomed by patient: Thread/unthread right sleeve, Thread/unthread left sleeve, Put head through opening, Pull shirt over trunk Pull over shirt/dress - Perfomed by helper: Pull shirt over trunk Assist Level: Supervision or verbal cues Function - Lower Body Dressing/Undressing What is the patient wearing?: Pants, Underwear, Non-skid slipper socks Position: Wheelchair/chair at sink Underwear - Performed by patient: Thread/unthread left underwear leg, Pull underwear up/down Underwear - Performed by helper: Thread/unthread right underwear leg Pants- Performed by patient: Thread/unthread right pants leg, Thread/unthread left pants leg, Pull pants up/down Pants- Performed by helper: Thread/unthread right pants leg, Thread/unthread left pants leg Non-skid slipper socks- Performed by helper: Don/doff right sock, Don/doff left sock Assist for footwear: Dependant Assist for lower body dressing: Touching or steadying assistance (Pt > 75%)  Function - Toileting Toileting activity did not occur: No continent bowel/bladder event Toileting steps completed by patient: Adjust clothing prior to toileting, Performs perineal hygiene Toileting steps completed by helper: Adjust clothing after toileting, Performs perineal hygiene Toileting Assistive Devices: Grab bar or rail Assist level: Touching or steadying assistance (Pt.75%) (standing urinal)  Function - Air cabin crew transfer assistive device: Elevated toilet seat/BSC over toilet, Grab bar Assist level to toilet: Supervision or verbal cues Assist level from toilet: Supervision or verbal cues  Function - Chair/bed transfer Chair/bed transfer method:  Ambulatory Chair/bed transfer assist level: Supervision or verbal cues Chair/bed transfer assistive device: Walker Chair/bed transfer details: Verbal cues for technique, Verbal cues for precautions/safety, Verbal cues for safe use of DME/AE, Verbal cues for sequencing  Function - Locomotion: Wheelchair Will patient use wheelchair at discharge?: No Type: Manual Max wheelchair distance: 41' Assist Level: Supervision or verbal cues Assist Level: Supervision or verbal cues Function - Locomotion: Ambulation Assistive device: Walker-rolling Max distance: 75 ft Assist level: Supervision or verbal cues Assist level: Supervision or verbal cues Walk 50 feet with 2 turns activity did not occur: Safety/medical concerns Assist level: Supervision or verbal cues Walk 150 feet activity did not occur: Safety/medical concerns Walk 10 feet on uneven surfaces activity did not occur: Safety/medical concerns  Function - Comprehension Comprehension: Auditory Comprehension assist level: Understands basic 75 - 89% of the time/ requires cueing 10 - 24% of the time  Function - Expression Expression: Verbal Expression assist level: Expresses basic 50 - 74% of the time/requires cueing 25 - 49% of the time. Needs to repeat parts of sentences.  Function - Social Interaction Social Interaction assist level: Interacts appropriately 75 - 89% of the time - Needs redirection for appropriate language or to initiate interaction.  Function - Problem Solving Problem solving assist level: Solves basic 50 - 74% of the time/requires cueing 25 - 49% of the time  Function - Memory Memory assist level: Recognizes or recalls 50 - 74% of the time/requires cueing 25 - 49% of the time Patient normally able to recall (first 3 days only): Location of own room, Current season, Staff names and faces, That he or she is in a hospital  Medical Problem List and Plan: 1. Decreased functional mobility  debilitation/encephalopathysecondary to colon cancer status post colectomy with creation of descending colostomy, patchy acute small left MCA and MCA/ACA territory infarcts likely embolic  Cont CIR   Notes reviewed, images reviewed 2. DVT Prophylaxis/Anticoagulation: Eliquis 3. Pain Management: Tylenol as needed 4. Mood: Provide emotional support 5. Neuropsych: This patient iscapable of making decisions on hisown behalf. 6. Skin/Wound Care: Routine skin checks 7. Fluids/Electrolytes/Nutrition: Routine I&O's 8.ABLA.   Hb 9.6 on 8/24 9.Hypertension. Coreg 3.125 mg twice a day, Lasix 40 mg daily Vitals:   02/21/17 1456 02/22/17 0333  BP: (!) 104/48 119/71  Pulse: 76 74  Resp: (!) 22 20  Temp: 98.8 F (37.1 C) 98.2 F (36.8 C)  SpO2: 95% 90%  . 10.CAD/CABG/AVR 2006. Follow-up cardiology services as needed. Continue anticoagulation 11.Type 2 diabetes mellitus. Hemoglobin A1c 6.4.SSI.Check blood sugars before meals and at bedtime CBG (last 3)    Recent Labs  02/21/17 1638 02/21/17 2049 02/22/17 0622  GLUCAP 116* 128* 88   Resumed glimipride   Relatively controlled 8/31 12.Hyperlipidemia. Lipitor  13. Incidental finding meningioma follow-up with neurology as outpatient 14.  On IV abx for ?PNA vs intra abdominal infx, changed to oral but given his dysarthria, concerns for po switched back to IV Unasyn to be completed today. 15.  Mental status changes-likely multifactorial, reviewed meds, has rectal CA which may cause fatigue as will CVA, as will disruption in perfusion due to ICA stenosis.    UA unremarkable, Ucx pending  CXR reviewed, overall improved  Repeat CT head reviewed, negative for acute changes.    LOS (Days) 8 A FACE TO FACE EVALUATION WAS PERFORMED  Nafisa Olds Lorie Phenix 02/22/2017, 8:04 AM

## 2017-02-22 NOTE — Progress Notes (Signed)
Pt participated in therapy today, alert and oriented and able to make needs known, pt called out today to void and was toileted and continent.  Later part of day while sleeping pt was incontinent of urine.  Pt up in w/c with call light in reach.

## 2017-02-22 NOTE — Progress Notes (Signed)
Speech Language Pathology Daily Session Note  Patient Details  Name: DRAYTON TIEU MRN: [id number] Date of Birth: 1931/09/10  Today's Date: 02/22/2017 SLP Individual Time: 1751-0258 SLP Individual Time Calculation (min): 55 min  Short Term Goals: Week 1: SLP Short Term Goal 1 (Week 1): Patient will consume current diet with minimal overt s/s of aspiration with mod I for use of swallowing compensatory strategies. SLP Short Term Goal 2 (Week 1): Patient will demonstrate efficient mastication with complete oral clearance of regular textures without overt s/s of aspiration over 2 sessions prior to upgrade.  SLP Short Term Goal 3 (Week 1): Patient will self-monitor and correct verbal errors at the sentence level with Min A verbal cues.  SLP Short Term Goal 4 (Week 1): Patient will demonstrate functional problem solving for basic and familiar tasks with Min A verbal cues.  SLP Short Term Goal 5 (Week 1): Patient will recall new, daily information with Min A verbal and visual cues.  SLP Short Term Goal 6 (Week 1): Patient will self-monitor and correct errors during functional tasks with Min A verbal cues.   Skilled Therapeutic Interventions: Skilled treatment session focused on cognitive goals. Upon arrival, patient was awake while upright in the wheelchair. SLP facilitated session by providing supervision verbal cues for recall during a mildly complex, novel task after a 20 minute delay. Patient demonstrated sustained attention to task for Mod I and demonstrated appropriate safety awareness with use of call bell to request assistance during session. Patient was also Mod I for word-finding throughout session. Patient left upright in wheelchair with all needs within reach. Continue with current plan of care.      Function:  Cognition Comprehension Comprehension assist level: Follows basic conversation/direction with no assist  Expression   Expression assist level: Expresses basic needs/ideas: With  extra time/assistive device  Social Interaction Social Interaction assist level: Interacts appropriately with others with medication or extra time (anti-anxiety, antidepressant).  Problem Solving Problem solving assist level: Solves basic 90% of the time/requires cueing < 10% of the time  Memory Memory assist level: Recognizes or recalls 90% of the time/requires cueing < 10% of the time    Pain No/Denies Pain   Therapy/Group: Individual Therapy  Denis Koppel 02/22/2017, 11:02 AM

## 2017-02-22 NOTE — Progress Notes (Signed)
Wife and family not here today for colostomy teaching

## 2017-02-22 NOTE — Progress Notes (Signed)
Wife and family not here today for colostomy teaching.

## 2017-02-22 NOTE — Progress Notes (Signed)
    CC:  Post op wound check  Subjective: Stable from our standpoint, tolerating diet and ostomy working.  He does not like to lie down.  Open wound looks good.  Staples removed from the remainder of the incision.  It is holding and doing well.    Objective: Vital signs in last 24 hours: Temp:  [98.2 F (36.8 C)-98.8 F (37.1 C)] 98.2 F (36.8 C) (08/31 0333) Pulse Rate:  [74-76] 74 (08/31 0333) Resp:  [20-22] 20 (08/31 0333) BP: (104-119)/(48-71) 119/71 (08/31 0333) SpO2:  [90 %-95 %] 90 % (08/31 0333) Weight:  [108.5 kg (239 lb 1.6 oz)] 108.5 kg (239 lb 1.6 oz) (08/31 0333) Last BM Date: 02/21/17  Intake/Output from previous day: 08/30 0701 - 08/31 0700 In: 1260 [P.O.:820; IV Piggyback:400] Out: 200 [Urine:150; Stool:50] Intake/Output this shift: Total I/O In: -  Out: 175 [Urine:175]  General appearance: alert, cooperative and no distress GI: distended abdomen, ostomy working, staples all removed and open site looks fine.  Lab Results:  No results for input(s): WBC, HGB, HCT, PLT in the last 72 hours.  BMET  Recent Labs  02/20/17 1157  NA 136  K 3.6  CL 101  CO2 29  GLUCOSE 134*  BUN 5*  CREATININE 0.79  CALCIUM 8.6*   PT/INR No results for input(s): LABPROT, INR in the last 72 hours.  No results for input(s): AST, ALT, ALKPHOS, BILITOT, PROT, ALBUMIN in the last 168 hours.   Lipase  No results found for: LIPASE   Medications: . apixaban  5 mg Oral BID  . atorvastatin  20 mg Oral q1800  . carvedilol  3.125 mg Oral BID WC  . dorzolamide-timolol  1 drop Both Eyes BID  . furosemide  40 mg Oral BID  . glimepiride  1 mg Oral Q breakfast  . insulin aspart  0-15 Units Subcutaneous TID WC  . insulin aspart  0-5 Units Subcutaneous QHS  . pantoprazole  40 mg Oral Q1200  . polyethylene glycol  17 g Oral Daily    Assessment/Plan 1. CVA L MCA - CIR 2. Colon ca - s/p colectomy and colostomy on 02/07/17 - T2N0 adenocarcinoma - Dr. Donnie Mesa POD  11 3.Partial open wound 4. DVT proph - Eliquis 5. ABLA - monitor CBC 6. HTN - Coreg 7. CAD 8. Dyslipidemia - Lipitor 9. DM2 FEN: D3 ID: [email or web address] 8/24 =>> day 4 DVT: apixaban   Plan:  Continue wet to dry dressing, staples out and incision looks fine.  We will see again next week.    LOS: 8 days    Tosca Pletz 02/22/2017 [phone]

## 2017-02-22 NOTE — Progress Notes (Signed)
Occupational Therapy Session Note  Patient Details  Name: Terry Wu MRN: [id number] Date of Birth: September 20, 1931  Today's Date: 02/22/2017 OT Individual Time: 1445-1557 OT Individual Time Calculation (min): 72 min    Short Term Goals: Week 1:  OT Short Term Goal 1 (Week 1): Pt will stand at sink to groom wiht supervision OT Short Term Goal 2 (Week 1): Pt will thread BLE into pants using AE PRN OT Short Term Goal 3 (Week 1): Pt will sit to stnad wiht MIN A in prep for clohting managment OT Short Term Goal 4 (Week 1): Pt will complete walk in shower transfer with min A OT Short Term Goal 5 (Week 1): Pt will transfer to BSC/toilet wiht min A  Skilled Therapeutic Interventions/Progress Updates:    1:1. Pt with no c/o pain. Pt ambulate into bathroom to void urine 2x during session with CGA for balance and VC for RW management. Pt incontinent of bladder in brief and soiled pants. With VC for technique, pt doffs/dons pull up pants while sitting on BSC using reacher. OT propels w/c to/from day room for energy conservation. Pt stands with CGA to put golf balls into holes to improve balance, coordination and standing endurance required for ADLs. Pt stands and bounces ball onto mounted trampoline 3x15 to improve BUE coordination, reaction time, endurance and balance with CGA with VC for foot placement with wide BOS. Exited session with pt seated in w/c with call light in reach and all needs met.   Therapy Documentation Precautions:  Precautions Precautions: Fall Precaution Comments: deconditioning Restrictions Weight Bearing Restrictions: No  See Function Navigator for Current Functional Status.   Therapy/Group: Individual Therapy  Tonny Branch 02/22/2017, 4:13 PM

## 2017-02-22 NOTE — Progress Notes (Signed)
Progress Note  Patient Name: Terry Wu Date of Encounter: 02/22/2017  Primary Cardiologist: Forestine Na  Subjective   No complaints ready to have breakfast   Inpatient Medications    Scheduled Meds: . apixaban  5 mg Oral BID  . atorvastatin  20 mg Oral q1800  . carvedilol  3.125 mg Oral BID WC  . dorzolamide-timolol  1 drop Both Eyes BID  . furosemide  40 mg Oral BID  . glimepiride  1 mg Oral Q breakfast  . insulin aspart  0-15 Units Subcutaneous TID WC  . insulin aspart  0-5 Units Subcutaneous QHS  . pantoprazole  40 mg Oral Q1200  . polyethylene glycol  17 g Oral Daily   Continuous Infusions: . ampicillin-sulbactam (UNASYN) IV 3 g (02/22/17 0811)   PRN Meds: ondansetron **OR** ondansetron (ZOFRAN) IV, sorbitol   Vital Signs    Vitals:   02/21/17 0500 02/21/17 0600 02/21/17 1456 02/22/17 0333  BP: (!) 165/76  (!) 104/48 119/71  Pulse: 88  76 74  Resp: (!) 24  (!) 22 20  Temp: 98.9 F (37.2 C)  98.8 F (37.1 C) 98.2 F (36.8 C)  TempSrc: Oral  Oral Oral  SpO2: 96%  95% 90%  Weight:  240 lb 6.4 oz (109 kg)  239 lb 1.6 oz (108.5 kg)  Height:        Intake/Output Summary (Last 24 hours) at 02/22/17 6294 Last data filed at 02/22/17 0208  Gross per 24 hour  Intake             1260 ml  Output              200 ml  Net             1060 ml   Filed Weights   02/20/17 0500 02/21/17 0600 02/22/17 0333  Weight: 243 lb 11.2 oz (110.5 kg) 240 lb 6.4 oz (109 kg) 239 lb 1.6 oz (108.5 kg)    Telemetry    Afib good rate control no long pauses - Personally Reviewed  ECG    Afib nonspecific ST changes  - Personally Reviewed  Physical Exam  Elderly obese white male  GEN: No acute distress.   Neck: No JVD Cardiac: RRR,SEM through AVR  murmurs, rubs, or gallops.  Respiratory: Clear to auscultation bilaterally. GI: Soft, nontender, non-distended colostomy  MS: No edema; No deformity. Neuro:  Some word finding difficulty Psych: Normal affect  Mild bilateral  edema with stasis chronic   Labs    Chemistry  Recent Labs Lab 02/20/17 1157  NA 136  K 3.6  CL 101  CO2 29  GLUCOSE 134*  BUN 5*  CREATININE 0.79  CALCIUM 8.6*  GFRNONAA >60  GFRAA >60  ANIONGAP 6     HematologyNo results for input(s): WBC, RBC, HGB, HCT, MCV, MCH, MCHC, RDW, PLT in the last 168 hours.  Cardiac EnzymesNo results for input(s): TROPONINI in the last 168 hours. No results for input(s): TROPIPOC in the last 168 hours.   BNP  Recent Labs Lab 02/20/17 1157  BNP 327.9*     DDimer No results for input(s): DDIMER in the last 168 hours.   Radiology    Dg Chest 2 View  Result Date: 02/21/2017 CLINICAL DATA:  Hypoxia, coronary artery disease status post CABG. History of previous CVA, diabetes, aortic valve replacement. EXAM: CHEST  2 VIEW COMPARISON:  Portable chest x-ray of February 19, 2017 FINDINGS: The right lung is adequately inflated. The lung  markings at the right lung base have improved. There is mild volume loss at the left lung base, but the left hemidiaphragm is better demonstrated today. The cardiac silhouette remains enlarged. The central pulmonary vascularity is less engorged. The pulmonary interstitial markings are less prominent. There are post CABG and aortic valve replacement changes. There is dense calcification in the wall of the thoracic aorta. IMPRESSION: Stable cardiomegaly. Slight decreased conspicuity of the pulmonary vascularity and pulmonary interstitial markings. Improving bibasilar atelectasis. Thoracic aortic atherosclerosis. Electronically Signed   By: Daviyon  Martinique M.D.   On: 02/21/2017 08:00   Ct Head Wo Contrast  Result Date: 02/21/2017 CLINICAL DATA:  83 year old with acute mental status changes and recurrent dizziness. Acute left MCA distribution infarcts identified on recent MRI. EXAM: CT HEAD WITHOUT CONTRAST TECHNIQUE: Contiguous axial images were obtained from the base of the skull through the vertex without intravenous contrast.  COMPARISON:  MRI brain 02/09/2017.  CT head 02/08/2017, 08/22/2015. FINDINGS: Brain: The acute stroke involving the left superior temporal lobe and anterior parietal lobe identified on the MRI 02/09/2017 manifests as slightly asymmetric low attenuation in the white matter in these locations on the current CT. No evidence of acute hemorrhage or hematoma. No extra-axial fluid collections. No mass lesion. No midline shift. Ventricular system normal in size and appearance for age. Mild-to-moderate cortical atrophy and mild cerebellar atrophy unchanged dating back to 2017. No new abnormalities. Vascular: Extensive bilateral carotid siphon and bilateral vertebral artery atherosclerosis. Skull: No skull fracture or other focal osseous abnormality involving the skull. Sinuses/Orbits: Minimal fluid in the inferior left mastoid air cells as noted on the recent MRI. Visualized paranasal sinuses, bilateral middle ear cavities and right mastoid air cells well-aerated. Other: None. IMPRESSION: 1. No acute intracranial abnormality. 2. The acute stroke in the left superior temporal and left anterior parietal lobes identified on the MRI 02/09/2017 manifest as asymmetric white matter low attenuation in these locations on the current CT. No evidence of hemorrhagic transformation. 3. Stable mild left mastoid effusion. Electronically Signed   By: Evangeline Dakin M.D.   On: 02/21/2017 14:01    Cardiac Studies   Echo 02/08/17 EF 60-65% mean gradient across AV stable 10 mmHg no AR  Patient Profile     83 y.o. 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 From afib and holding anticoagulation for surgery vs tight right ICA  stenosis will need outpatient f/u with [facility] Dr Donnetta Hutching 4) CABG/CAD:  No angina stable continue corge  Needs continued pulmonary toilet and rehab cardiac status stable will sign off  Signed, Jenkins Rouge, MD  02/22/2017, 8:21 AM

## 2017-02-22 NOTE — Plan of Care (Signed)
Problem: RH Bed Mobility Goal: LTG Patient will perform bed mobility with assist (PT) LTG: Patient will perform bed mobility with assistance, with/without cues (PT).  Outcome: Not Applicable Date Met: 83/29/19 D/C'd goal as pt does not sleep in a bed at home.

## 2017-02-22 NOTE — Consult Note (Addendum)
[facility] Nurse ostomy follow up Surgical team following for assessment and plan of care for abd wound.  Stoma type/location: Colostomy stoma from surgery on 8/16.   Stomal assessment/size: Stoma red and viable, 1 1/2 inches, slightly oval and slightly above skin level. Peristomal assessment: Intact skin surrounding Output: Mod amt semiformed brown stoolin the pouch Ostomy pouching: 1pc.  Education provided: Demonstrated pouch change using one piece pouch and barrier ring to maintain a seal. Pt watched procedure but did not participate in application. He states he will not be able to perform pouching activities because he cannot see well enough.  He says his wife will need to apply and empty his pouch.  She has not been present for teaching sessions since she had to go out of town to handle business, but will return this weekend, according to the patient.  WOC will plan a teaching session next week when she can be present. Supplies at the bedside for staff nurse use. Educational materials left at the bedside. Enrolled patient in [place] Start Discharge program: Yes Julien Girt MSN, RN, [place], [place], [place]

## 2017-02-22 NOTE — Progress Notes (Signed)
Physical Therapy Weekly Progress Note  Patient Details  Name: Terry Wu MRN: [id number] Date of Birth: [birth year]  Beginning of progress report period: February 15, 2017 End of progress report period: February 22, 2017  Today's Date: 02/22/2017 PT Individual Time: 1000-1100 PT Individual Time Calculation (min): 60 min   Patient has met 3 of 3 short term goals.  Pt has made consistent progress with mobility this week and is currently performing all functional mobility at an overall supervision level with RW.  Over several days during the middle of this reporting period, pt with increased lethargy/somnolence, and reduce cardiovascular support, which affected his ability to participate fully in therapy schedule.  Pt ordered to be placed on O2 as needed and able to maintain sats >90% on 1-2L via nasal cannula.  Will continue to monitor throughout therapy sessions in case of need to d/c with home O2.   Patient continues to demonstrate the following deficits muscle weakness, decreased cardiorespiratoy endurance and decreased oxygen support, decreased attention, decreased awareness, decreased problem solving, decreased safety awareness, decreased memory and delayed processing and decreased standing balance, decreased postural control and decreased balance strategies and therefore will continue to benefit from skilled PT intervention to increase functional independence with mobility.  Patient progressing toward long term goals..  Plan of care revisions: discontinued bed mobility goal as pt sleeps in a recliner at home and is currently declining to attempt any bed mobility without assist. .  PT Short Term Goals Week 1:  PT Short Term Goal 1 (Week 1): Pt will ambulate 75' with LRAD and min assist PT Short Term Goal 1 - Progress (Week 1): Met PT Short Term Goal 2 (Week 1): Pt will initiate stair negotiation with PT PT Short Term Goal 2 - Progress (Week 1): Met PT Short Term Goal 3 (Week 1): Pt will  complete BERG Balance Scale PT Short Term Goal 3 - Progress (Week 1): Met Week 2:  PT Short Term Goal 1 (Week 2): =LTGs due to ELOS  Skilled Therapeutic Interventions/Progress Updates:    no c/o pain.  Pt sleeping on arrival, but awakes easily to voice.  Session focus on transfers and activity tolerance.    Pt completes w/c<>bed transfers with RW and supervision for physician to assess abdominal wound.  Provided education along with MD on maintaining wound cleanliness to reduce risk of infection and pt verbalized understanding.  Pt completes toileting with ambulation into bathroom with RW and supervision, requires assist to pull pants over hips.  Standing tolerance at sink for grooming tasks with set up assist.  Pt completes UEB 2x4 minutes (forwards and backwards) at level 1.5 for strengthening and endurance.  PT instructs pt in 3x10 reps each of incentive spirometry and acapella flutter valve.  Pt continues to require max multimodal cues for correct use of incentive spirometry, but able to complete flutter valve with supervision only.  Pt left upright in w/c at end of session with call bell in reach and needs met.   Therapy Documentation Precautions:  Precautions Precautions: Fall Precaution Comments: deconditioning Restrictions Weight Bearing Restrictions: No   See Function Navigator for Current Functional Status.  Therapy/Group: Individual Therapy  Michel Santee 02/22/2017, 11:19 AM

## 2017-02-22 NOTE — Progress Notes (Signed)
Speech Language Pathology Weekly Progress and Make-up Session Note  Patient Details  Name: Terry Wu MRN: [id number] Date of Birth: [birth year]  Beginning of progress report period: February 15, 2017 End of progress report period: February 22, 2017  Today's Date: 02/22/2017 SLP Individual Time: 1145-1210 SLP Individual Time Calculation (min): 25 min  Short Term Goals: Week 1: SLP Short Term Goal 1 (Week 1): Patient will consume current diet with minimal overt s/s of aspiration with mod I for use of swallowing compensatory strategies. SLP Short Term Goal 1 - Progress (Week 1): Not met SLP Short Term Goal 2 (Week 1): Patient will demonstrate efficient mastication with complete oral clearance of regular textures without overt s/s of aspiration over 2 sessions prior to upgrade.  SLP Short Term Goal 2 - Progress (Week 1): Not met SLP Short Term Goal 3 (Week 1): Patient will self-monitor and correct verbal errors at the sentence level with Min A verbal cues.  SLP Short Term Goal 3 - Progress (Week 1): Met SLP Short Term Goal 4 (Week 1): Patient will demonstrate functional problem solving for basic and familiar tasks with Min A verbal cues.  SLP Short Term Goal 4 - Progress (Week 1): Met SLP Short Term Goal 5 (Week 1): Patient will recall new, daily information with Min A verbal and visual cues.  SLP Short Term Goal 5 - Progress (Week 1): Met SLP Short Term Goal 6 (Week 1): Patient will self-monitor and correct errors during functional tasks with Min A verbal cues.  SLP Short Term Goal 6 - Progress (Week 1): Met    New Short Term Goals: Week 2: SLP Short Term Goal 1 (Week 2): Patient will self-monitor and correct errors during functional tasks with supervision verbal cues.  SLP Short Term Goal 2 (Week 2): Patient will recall new, daily information with supervision verbal and visual cues.  SLP Short Term Goal 3 (Week 2): Patient will demonstrate functional problem solving for basic and familiar  tasks with supervision verbal cues.  SLP Short Term Goal 4 (Week 2): Patient will consume current diet with minimal overt s/s of aspiration with mod I for use of swallowing compensatory strategies.  Weekly Progress Updates: Patient has made functional and inconsistent gains and has met 4 of 6 STG' s this reporting period due to improved cognitive functioning. Currently, patient requires overall Min A verbal cues for recall of functional information, functional problem solving and emergent awareness and is Mod I for word-finding.  Patient is currently consuming Dys. 2 textures with thin liquids with minimal overt s/s of aspiration and intermittent supervision. Patient's textures were downgraded due to patient's reports of difficulty masticating food due to "toughness" and poor dentition. Patient's progress was also impacted by lethargy this week which appears to be resolving. Patient and family education is ongoing. Patient would benefit from continued skilled SLP intervention to maximize his cognitive and swallowing function and overall functional independence prior to discharge.      Intensity: Minumum of 1-2 x/day, 30 to 90 minutes Frequency: 3 to 5 out of 7 days Duration/Length of Stay: 02/27/17 Treatment/Interventions: Cognitive remediation/compensation;Cueing hierarchy;Functional tasks;Patient/family education;Environmental controls;Dysphagia/aspiration precaution training;Speech/Language facilitation;Internal/external aids;Therapeutic Activities   Daily Session  Skilled Therapeutic Interventions: Skilled treatment session focused on dysphagia goals. SLP facilitated session by providing skilled observation with lunch meal of Dys. 3 textures with thin liquids. Patient demonstrated overt cough X 1, suspect due to impaired mastication due to "toughness" of food which patient had to eventually expectorate. Patient also reported  that he was been experiencing increased difficulty with food due to   "thoughness" and poor condition of dentition. Recommend patient downgrade to Dsy. 2 textures. Patient verbalized understanding and agreement. No further s/s of aspiration were noted throughout meal. Patient left upright in wheelchair with all needs within reach. Continue with current plan of care.    Function:   Eating Eating   Modified Consistency Diet: Yes Eating Assist Level: Supervision or verbal cues           Cognition Comprehension Comprehension assist level: Follows basic conversation/direction with no assist  Expression   Expression assist level: Expresses basic needs/ideas: With extra time/assistive device  Social Interaction Social Interaction assist level: Interacts appropriately with others with medication or extra time (anti-anxiety, antidepressant).  Problem Solving Problem solving assist level: Solves basic 90% of the time/requires cueing < 10% of the time  Memory Memory assist level: Recognizes or recalls 90% of the time/requires cueing < 10% of the time   Pain No/Denies Pain   Therapy/Group: Individual Therapy  Brietta Manso 02/22/2017, 12:55 PM

## 2017-02-22 NOTE — Progress Notes (Signed)
Occupational Therapy Session Note  Patient Details  Name: Terry Wu MRN: [id number] Date of Birth: 1932-05-30  Today's Date: 02/22/2017 OT Individual Time: 7048-8891 OT Individual Time Calculation (min): 23 min Make up time   Short Term Goals: Week 1:  OT Short Term Goal 1 (Week 1): Pt will stand at sink to groom wiht supervision OT Short Term Goal 2 (Week 1): Pt will thread BLE into pants using AE PRN OT Short Term Goal 3 (Week 1): Pt will sit to stnad wiht MIN A in prep for clohting managment OT Short Term Goal 4 (Week 1): Pt will complete walk in shower transfer with min A OT Short Term Goal 5 (Week 1): Pt will transfer to BSC/toilet wiht min A  Skilled Therapeutic Interventions/Progress Updates:    Treatment session with focus on increased participation with LB dressing.  Pt declined bathing or attempting to utilize any AE to increase independence with LB dressing.  Pt able to thread Lt pant leg and pull pants up in standing with alternating UE support on RW.  Pt reports that his wife will assist with majority of self-care, even discussed colostomy care with pt reporting his wife will attend to that.  Continue to encourage pt take active roll in self-care tasks.  Therapy Documentation Precautions:  Precautions Precautions: Fall Precaution Comments: deconditioning Restrictions Weight Bearing Restrictions: No Pain:  Pt with no c/o pain  See Function Navigator for Current Functional Status.   Therapy/Group: Individual Therapy  Simonne Come 02/22/2017, 12:21 PM

## 2017-02-23 ENCOUNTER — Inpatient Hospital Stay (HOSPITAL_COMMUNITY): Payer: Medicare Other | Admitting: Occupational Therapy

## 2017-02-23 ENCOUNTER — Inpatient Hospital Stay (HOSPITAL_COMMUNITY): Payer: Medicare Other | Admitting: Physical Therapy

## 2017-02-23 LAB — GLUCOSE, CAPILLARY
GLUCOSE-CAPILLARY: 85 mg/dL (ref 65–99)
GLUCOSE-CAPILLARY: 141 mg/dL — AB (ref 65–99)
Glucose-Capillary: 99 mg/dL (ref 65–99)

## 2017-02-23 NOTE — Progress Notes (Signed)
Speech Language Pathology Daily Session Note  Patient Details  Name: Terry Wu MRN: [id number] Date of Birth: July 12, 1931  Today's Date: 02/23/2017 SLP Individual Time: 1200-1210 SLP Individual Time Calculation (min): 10 min  Short Term Goals: Week 2: SLP Short Term Goal 1 (Week 2): Patient will self-monitor and correct errors during functional tasks with supervision verbal cues.  SLP Short Term Goal 2 (Week 2): Patient will recall new, daily information with supervision verbal and visual cues.  SLP Short Term Goal 3 (Week 2): Patient will demonstrate functional problem solving for basic and familiar tasks with supervision verbal cues.  SLP Short Term Goal 4 (Week 2): Patient will consume current diet with minimal overt s/s of aspiration with mod I for use of swallowing compensatory strategies.  Skilled Therapeutic Interventions: Skilled treatment session focused on dysphagia goals. SLP facilitated session by providing skilled observation of pt consuming thin water via cup and with straw. Pt with subtle wet voice x 2 and subtle throat clear x 1 when consuming 4 oz thin water via cup. Pt with 1 subtle throat clear when consuming 2 oz via straw. Pt left in room, continue per current plan of care.      Function:  Eating Eating   Modified Consistency Diet: No Eating Assist Level: More than reasonable amount of time           Cognition Comprehension Comprehension assist level: Follows basic conversation/direction with extra time/assistive device  Expression   Expression assist level: Expresses basic needs/ideas: With no assist  Social Interaction Social Interaction assist level: Interacts appropriately 50 - 74% of the time - May be physically or verbally inappropriate.  Problem Solving Problem solving assist level: Solves basic 50 - 74% of the time/requires cueing 25 - 49% of the time  Memory Memory assist level: Recognizes or recalls 50 - 74% of the time/requires cueing 25 - 49% of  the time    Pain    Therapy/Group: Individual Therapy  Norwood Quezada 02/23/2017, 4:21 PM

## 2017-02-23 NOTE — Plan of Care (Signed)
Problem: RH BLADDER ELIMINATION Goal: RH STG MANAGE BLADDER WITH ASSISTANCE STG Manage Bladder With mod I Assistance   Outcome: Not Progressing Pt is mod dep. Requires assistance with pulling down pants and does not hold the urinal.   Problem: RH SKIN INTEGRITY Goal: RH STG MAINTAIN SKIN INTEGRITY WITH ASSISTANCE STG Maintain Skin Integrity With Mod I Assistance  Outcome: Not Progressing Pt uninterested in learning dressing changes Goal: RH STG ABLE TO PERFORM INCISION/WOUND CARE W/ASSISTANCE STG Able To Perform Incision/Wound Care With Mod Dependence   Outcome: Progressing Wife and daughter are for learning at this time.

## 2017-02-23 NOTE — Progress Notes (Signed)
Wife and daughter in today after therapy.  Showed both how to empty colostomy. Daughter reports that is familiar with process but not this system.  Also showed both wife and daughter to change dressing to midline wound.  Wife reports that ostomy nurse can reach daughter at [phone] for a time to do ostomy teaching and changing bag.

## 2017-02-23 NOTE — Progress Notes (Signed)
Physical Therapy Session Note  Patient Details  Name: Terry Wu MRN: [id number] Date of Birth: [birth year]  Today's Date: 02/23/2017 PT Individual Time: 7414-2395 PT Individual Time Calculation (min): 43 min   Short Term Goals: Week 2:  PT Short Term Goal 1 (Week 2): =LTGs due to ELOS  Skilled Therapeutic Interventions/Progress Updates:    no c/o pain.  Session focus on pulmonary exercise and activity tolerance.  O2 maintained >90% on .5L throughout session.   PT instructed pt in 2x10 reps with incentive spirometer and acapella flutter valve with min cues for technique.  Pt ambulates throughout unit, max distance 125', with RW and distant supervision.  Pt completes seated table top activity focus on visual scanning, attention, and fine motor coordination, with verbal cues for not holding breath during activity.  Pt returned to room at end of session, requires occasional short standing rest break during ambulation.  Positioned in w/c with call bell in reach and needs met.   Therapy Documentation Precautions:  Precautions Precautions: Fall Precaution Comments: deconditioning Restrictions Weight Bearing Restrictions: No   See Function Navigator for Current Functional Status.   Therapy/Group: Individual Therapy  Michel Santee 02/23/2017, 3:29 PM

## 2017-02-23 NOTE — Progress Notes (Signed)
Physical Therapy Session Note  Patient Details  Name: Terry Wu MRN: [id number] Date of Birth: [birth year]  Today's Date: 02/23/2017 PT Individual Time: 1120-1200 PT Individual Time Calculation (min): 40 min   Short Term Goals: Week 2:  PT Short Term Goal 1 (Week 2): =LTGs due to ELOS  Skilled Therapeutic Interventions/Progress Updates:    no c/o pain.  Session focus on activity tolerance and maintaining O2 saturations.    Pt asked again at start of session why he was wearing supplemental O2 and PT provided re-education on maintaining O2 saturations above 90%, which pt had been struggling to do in recent days.    Pt completed 3 trials of dynavision, mode A, in standing for activity tolerance and endurance with seated rest breaks in between.  O2 saturation on 1L 97%, reduced to .5L for next 2 trials with O2 saturation at completion 89% (returned to 91% with verbal cues for deep breath in through the nose) and 96%.    Nustep x8 minutes at level 4 for activity tolerance, O2 sat maintaind >93% on .5L throughout.  Pt returned to room at end of session and called nursing staff for assist with toileting with min verbal cues.  Left upright in w/c with QRB in place, call bell in reach and needs met.   Therapy Documentation Precautions:  Precautions Precautions: Fall Precaution Comments: deconditioning Restrictions Weight Bearing Restrictions: No   See Function Navigator for Current Functional Status.   Therapy/Group: Individual Therapy  Michel Santee 02/23/2017, 12:05 PM

## 2017-02-23 NOTE — Progress Notes (Signed)
Subjective/Complaints: Patient seen sleeping sitting up in his chair this morning. He states he slept well overnight. However, he does not remember me.  ROS: Denies CP, SOB, N/V/D  Objective: Vital Signs: Blood pressure 140/62, pulse 66, temperature 99 F (37.2 C), temperature source Oral, resp. rate 20, height '5\' 9"'$  (1.753 m), weight 107.7 kg (237 lb 8 oz), SpO2 100 %. Dg Chest 2 View  Result Date: 02/21/2017 CLINICAL DATA:  Hypoxia, coronary artery disease status post CABG. History of previous CVA, diabetes, aortic valve replacement. EXAM: CHEST  2 VIEW COMPARISON:  Portable chest x-ray of February 19, 2017 FINDINGS: The right lung is adequately inflated. The lung markings at the right lung base have improved. There is mild volume loss at the left lung base, but the left hemidiaphragm is better demonstrated today. The cardiac silhouette remains enlarged. The central pulmonary vascularity is less engorged. The pulmonary interstitial markings are less prominent. There are post CABG and aortic valve replacement changes. There is dense calcification in the wall of the thoracic aorta. IMPRESSION: Stable cardiomegaly. Slight decreased conspicuity of the pulmonary vascularity and pulmonary interstitial markings. Improving bibasilar atelectasis. Thoracic aortic atherosclerosis. Electronically Signed   By: Cleland  Martinique M.D.   On: 02/21/2017 08:00   Ct Head Wo Contrast  Result Date: 02/21/2017 CLINICAL DATA:  83 year old with acute mental status changes and recurrent dizziness. Acute left MCA distribution infarcts identified on recent MRI. EXAM: CT HEAD WITHOUT CONTRAST TECHNIQUE: Contiguous axial images were obtained from the base of the skull through the vertex without intravenous contrast. COMPARISON:  MRI brain 02/09/2017.  CT head 02/08/2017, 08/22/2015. FINDINGS: Brain: The acute stroke involving the left superior temporal lobe and anterior parietal lobe identified on the MRI 02/09/2017 manifests as  slightly asymmetric low attenuation in the white matter in these locations on the current CT. No evidence of acute hemorrhage or hematoma. No extra-axial fluid collections. No mass lesion. No midline shift. Ventricular system normal in size and appearance for age. Mild-to-moderate cortical atrophy and mild cerebellar atrophy unchanged dating back to 2017. No new abnormalities. Vascular: Extensive bilateral carotid siphon and bilateral vertebral artery atherosclerosis. Skull: No skull fracture or other focal osseous abnormality involving the skull. Sinuses/Orbits: Minimal fluid in the inferior left mastoid air cells as noted on the recent MRI. Visualized paranasal sinuses, bilateral middle ear cavities and right mastoid air cells well-aerated. Other: None. IMPRESSION: 1. No acute intracranial abnormality. 2. The acute stroke in the left superior temporal and left anterior parietal lobes identified on the MRI 02/09/2017 manifest as asymmetric white matter low attenuation in these locations on the current CT. No evidence of hemorrhagic transformation. 3. Stable mild left mastoid effusion. Electronically Signed   By: Evangeline Dakin M.D.   On: 02/21/2017 14:01   Results for orders placed or performed during the hospital encounter of 02/14/17 (from the past 72 hour(s))  Glucose, capillary     Status: Abnormal   Collection Time: 02/20/17 11:33 AM  Result Value Ref Range   Glucose-Capillary 128 (H) 65 - 99 mg/dL  Basic metabolic panel     Status: Abnormal   Collection Time: 02/20/17 11:57 AM  Result Value Ref Range   Sodium 136 135 - 145 mmol/L   Potassium 3.6 3.5 - 5.1 mmol/L   Chloride 101 101 - 111 mmol/L   CO2 29 22 - 32 mmol/L   Glucose, Bld 134 (H) 65 - 99 mg/dL   BUN 5 (L) 6 - 20 mg/dL   Creatinine, Ser 0.79 0.61 -  1.24 mg/dL   Calcium 8.6 (L) 8.9 - 10.3 mg/dL   GFR calc non Af Amer >60 >60 mL/min   GFR calc Af Amer >60 >60 mL/min    Comment: (NOTE) The eGFR has been calculated using the CKD  EPI equation. This calculation has not been validated in all clinical situations. eGFR's persistently <60 mL/min signify possible Chronic Kidney Disease.    Anion gap 6 5 - 15  Brain natriuretic peptide     Status: Abnormal   Collection Time: 02/20/17 11:57 AM  Result Value Ref Range   B Natriuretic Peptide 327.9 (H) 0.0 - 100.0 pg/mL  Blood gas, arterial     Status: Abnormal   Collection Time: 02/20/17  3:31 PM  Result Value Ref Range   FIO2 21.00    pH, Arterial 7.445 7.350 - 7.450   pCO2 arterial 41.9 32.0 - 48.0 mmHg   pO2, Arterial 68.4 (L) 83.0 - 108.0 mmHg   Bicarbonate 28.3 (H) 20.0 - 28.0 mmol/L   Acid-Base Excess 4.4 (H) 0.0 - 2.0 mmol/L   O2 Saturation 93.6 %   Patient temperature 98.6    Collection site RIGHT RADIAL    Drawn by [id number]    Sample type ARTERIAL DRAW    Allens test (pass/fail) PASS PASS  Glucose, capillary     Status: Abnormal   Collection Time: 02/20/17  4:47 PM  Result Value Ref Range   Glucose-Capillary 123 (H) 65 - 99 mg/dL  Glucose, capillary     Status: Abnormal   Collection Time: 02/20/17  8:54 PM  Result Value Ref Range   Glucose-Capillary 128 (H) 65 - 99 mg/dL  Glucose, capillary     Status: Abnormal   Collection Time: 02/21/17  6:40 AM  Result Value Ref Range   Glucose-Capillary 126 (H) 65 - 99 mg/dL   Comment 1 Notify RN   Urine Culture     Status: None   Collection Time: 02/21/17  7:22 AM  Result Value Ref Range   Specimen Description URINE, CLEAN CATCH    Special Requests NONE    Culture NO GROWTH    Report Status 02/22/2017 FINAL   Urinalysis, Routine w reflex microscopic     Status: None   Collection Time: 02/21/17  8:40 AM  Result Value Ref Range   Color, Urine YELLOW YELLOW   APPearance CLEAR CLEAR   Specific Gravity, Urine 1.013 1.005 - 1.030   pH 6.0 5.0 - 8.0   Glucose, UA NEGATIVE NEGATIVE mg/dL   Hgb urine dipstick NEGATIVE NEGATIVE   Bilirubin Urine NEGATIVE NEGATIVE   Ketones, ur NEGATIVE NEGATIVE mg/dL    Protein, ur NEGATIVE NEGATIVE mg/dL   Nitrite NEGATIVE NEGATIVE   Leukocytes, UA NEGATIVE NEGATIVE  Glucose, capillary     Status: None   Collection Time: 02/21/17 11:52 AM  Result Value Ref Range   Glucose-Capillary 94 65 - 99 mg/dL  Glucose, capillary     Status: Abnormal   Collection Time: 02/21/17  4:38 PM  Result Value Ref Range   Glucose-Capillary 116 (H) 65 - 99 mg/dL  Glucose, capillary     Status: Abnormal   Collection Time: 02/21/17  8:49 PM  Result Value Ref Range   Glucose-Capillary 128 (H) 65 - 99 mg/dL  Glucose, capillary     Status: None   Collection Time: 02/22/17  6:22 AM  Result Value Ref Range   Glucose-Capillary 88 65 - 99 mg/dL  Glucose, capillary     Status: None   Collection  Time: 02/22/17 11:32 AM  Result Value Ref Range   Glucose-Capillary 92 65 - 99 mg/dL  Glucose, capillary     Status: Abnormal   Collection Time: 02/22/17  4:43 PM  Result Value Ref Range   Glucose-Capillary 104 (H) 65 - 99 mg/dL  Glucose, capillary     Status: Abnormal   Collection Time: 02/22/17  9:24 PM  Result Value Ref Range   Glucose-Capillary 140 (H) 65 - 99 mg/dL   Comment 1 Notify RN   Glucose, capillary     Status: None   Collection Time: 02/23/17  6:26 AM  Result Value Ref Range   Glucose-Capillary 99 65 - 99 mg/dL   Comment 1 Notify RN      HEENT: normocephalic, atraumatic Cardio: RRR and no JVD Resp: CTA B/L and unlabored. +[place] GI: BS positive and + colostomy  Skin: stasis dermatitis BLE. Midline abd wound with dressing.  Neuro: Alert and oriented x3. Motor 4/5 grossly throughout Musc/Skel:  No edema. No tenderness.  Psych: Flat. Normal behavior  Assessment/Plan: 1. Functional deficits secondary to debility which require 3+ hours per day of interdisciplinary therapy in a comprehensive inpatient rehab setting. Physiatrist is providing close team supervision and 24 hour management of active medical problems listed below. Physiatrist and rehab team continue to  assess barriers to discharge/monitor patient progress toward functional and medical goals. FIM: Function - Bathing Position: Wheelchair/chair at sink Body parts bathed by patient: Right upper leg, Left upper leg, Right lower leg, Left lower leg, Buttocks, Front perineal area Body parts bathed by helper: Back, Left lower leg, Right lower leg, Left upper leg, Right upper leg Bathing not applicable: Right arm, Left arm, Chest, Abdomen Assist Level: Touching or steadying assistance(Pt > 75%)  Function- Upper Body Dressing/Undressing What is the patient wearing?: Pull over shirt/dress Pull over shirt/dress - Perfomed by patient: Thread/unthread right sleeve, Thread/unthread left sleeve, Put head through opening, Pull shirt over trunk Pull over shirt/dress - Perfomed by helper: Pull shirt over trunk Assist Level: Supervision or verbal cues Function - Lower Body Dressing/Undressing What is the patient wearing?: Pants Position: Wheelchair/chair at sink Underwear - Performed by patient: Thread/unthread left underwear leg, Pull underwear up/down Underwear - Performed by helper: Thread/unthread right underwear leg Pants- Performed by patient: Thread/unthread right pants leg, Pull pants up/down Pants- Performed by helper: Thread/unthread left pants leg Non-skid slipper socks- Performed by helper: Don/doff right sock, Don/doff left sock Assist for footwear: Dependant Assist for lower body dressing: Touching or steadying assistance (Pt > 75%)  Function - Toileting Toileting activity did not occur: No continent bowel/bladder event Toileting steps completed by patient: Adjust clothing prior to toileting Toileting steps completed by helper: Adjust clothing after toileting Toileting Assistive Devices: Grab bar or rail Assist level: Touching or steadying assistance (Pt.75%)  Function - Air cabin crew transfer assistive device: Elevated toilet seat/BSC over toilet, Grab bar Assist level to  toilet: Supervision or verbal cues Assist level from toilet: Supervision or verbal cues  Function - Chair/bed transfer Chair/bed transfer method: Ambulatory Chair/bed transfer assist level: Supervision or verbal cues Chair/bed transfer assistive device: Armrests, Walker Chair/bed transfer details: Verbal cues for precautions/safety, Verbal cues for technique  Function - Locomotion: Wheelchair Will patient use wheelchair at discharge?: No Type: Manual Max wheelchair distance: 39' Assist Level: Supervision or verbal cues Assist Level: Supervision or verbal cues Function - Locomotion: Ambulation Assistive device: Walker-rolling Max distance: 75 ft Assist level: Supervision or verbal cues Assist level: Supervision or verbal cues Walk  50 feet with 2 turns activity did not occur: Safety/medical concerns Assist level: Supervision or verbal cues Walk 150 feet activity did not occur: Safety/medical concerns Walk 10 feet on uneven surfaces activity did not occur: Safety/medical concerns  Function - Comprehension Comprehension: Auditory Comprehension assist level: Follows basic conversation/direction with no assist  Function - Expression Expression: Verbal Expression assist level: Expresses basic needs/ideas: With extra time/assistive device  Function - Social Interaction Social Interaction assist level: Interacts appropriately with others with medication or extra time (anti-anxiety, antidepressant).  Function - Problem Solving Problem solving assist level: Solves basic 90% of the time/requires cueing < 10% of the time  Function - Memory Memory assist level: Recognizes or recalls 90% of the time/requires cueing < 10% of the time Patient normally able to recall (first 3 days only): Location of own room, Current season, Staff names and faces, That he or she is in a hospital  Medical Problem List and Plan: 1. Decreased functional mobility debilitation/encephalopathysecondary to colon  cancer status post colectomy with creation of descending colostomy, patchy acute small left MCA and MCA/ACA territory infarcts likely embolic  Cont CIR  2. DVT Prophylaxis/Anticoagulation: Eliquis 3. Pain Management: Tylenol as needed 4. Mood: Provide emotional support 5. Neuropsych: This patient iscapable of making decisions on hisown behalf. 6. Skin/Wound Care: Routine skin checks 7. Fluids/Electrolytes/Nutrition: Routine I&O's 8.ABLA.   Hb 9.6 on 8/24  Labs ordered for Monday 9.Hypertension. Coreg 3.125 mg twice a day, Lasix 40 mg daily Vitals:   02/22/17 1410 02/23/17 0600  BP: 140/72 140/62  Pulse: (!) 54 66  Resp: 18 20  Temp: 98.3 F (36.8 C) 99 F (37.2 C)  SpO2: 96% 100%   Relatively controlled on 9/1 10.CAD/CABG/AVR 2006. Follow-up cardiology services as needed. Continue anticoagulation 11.Type 2 diabetes mellitus. Hemoglobin A1c 6.4.SSI.Check blood sugars before meals and at bedtime CBG (last 3)    Recent Labs  02/22/17 1643 02/22/17 2124 02/23/17 0626  GLUCAP 104* 140* 99   Resumed glimipride   Relatively controlled 9/1 12.Hyperlipidemia. Lipitor  13. Incidental finding meningioma follow-up with neurology as outpatient 14.  On IV abx for ?PNA vs intra abdominal infx, changed to oral but given his dysarthria, concerns for po switched back to IV Unasyn completed on 8/31. 15.  Mental status changes-likely multifactorial, reviewed meds, has rectal CA which may cause fatigue as will CVA, as will disruption in perfusion due to ICA stenosis.    UA unremarkable, Ucx No growth  CXR reviewed, overall improved  Repeat CT head reviewed, negative for acute changes.    LOS (Days) 9 A FACE TO FACE EVALUATION WAS PERFORMED  Brodan Grewell Lorie Phenix 02/23/2017, 7:26 AM

## 2017-02-23 NOTE — Progress Notes (Signed)
Occupational Therapy Session Note  Patient Details  Name: Terry Wu MRN: [id number] Date of Birth: March 03, 1932  Today's Date: 02/23/2017 OT Individual Time: 8828-0034 OT Individual Time Calculation (min): 57 min    Short Term Goals: Week 1:  OT Short Term Goal 1 (Week 1): Pt will stand at sink to groom wiht supervision OT Short Term Goal 2 (Week 1): Pt will thread BLE into pants using AE PRN OT Short Term Goal 3 (Week 1): Pt will sit to stnad wiht MIN A in prep for clohting managment OT Short Term Goal 4 (Week 1): Pt will complete walk in shower transfer with min A OT Short Term Goal 5 (Week 1): Pt will transfer to BSC/toilet wiht min A  Skilled Therapeutic Interventions/Progress Updates:   Upon entering the room, patient seated in wheelchair awaiting therapist. Pt ambulating into bathroom with RW and supervision. Pt performed toilet transfer with steady assistance onto elevated surface. Pt required steady assistance with clothing management and hygiene. Pt unable to void this session. Pt standing at sink for grooming tasks with steady assistance for balance. Pt ambulating 54' with RW to ADL apartment. O2 saturation remaining between 92-96% while on 1 L via [place]. Pt transferred onto various types of furniture similar to home environment with min A overall for safety. Pt taking seated rest breaks as needed secondary to fatigue. Pt returning to room at end of session with call bell and all needed items within reach. Quick release belt donned on pt.   Therapy Documentation Precautions:  Precautions Precautions: Fall Precaution Comments: deconditioning Restrictions Weight Bearing Restrictions: No  See Function Navigator for Current Functional Status.   Therapy/Group: Individual Therapy  Gypsy Decant 02/23/2017, 10:02 AM

## 2017-02-24 ENCOUNTER — Inpatient Hospital Stay (HOSPITAL_COMMUNITY): Payer: Medicare Other

## 2017-02-24 ENCOUNTER — Inpatient Hospital Stay (HOSPITAL_COMMUNITY): Payer: Medicare Other | Admitting: Occupational Therapy

## 2017-02-24 LAB — GLUCOSE, CAPILLARY
GLUCOSE-CAPILLARY: 99 mg/dL (ref 65–99)
GLUCOSE-CAPILLARY: 103 mg/dL — AB (ref 65–99)
GLUCOSE-CAPILLARY: 120 mg/dL — AB (ref 65–99)
Glucose-Capillary: 123 mg/dL — ABNORMAL HIGH (ref 65–99)

## 2017-02-24 NOTE — Plan of Care (Signed)
Problem: RH BLADDER ELIMINATION Goal: RH STG MANAGE BLADDER WITH ASSISTANCE STG Manage Bladder With mod I Assistance   Outcome: Not Progressing Incontinent requiring brief change with max assist   Problem: RH SKIN INTEGRITY Goal: RH STG ABLE TO PERFORM INCISION/WOUND CARE W/ASSISTANCE STG Able To Perform Incision/Wound Care With Mod Dependence   Outcome: Not Progressing RN performs dressing change total

## 2017-02-24 NOTE — Progress Notes (Addendum)
Subjective/Complaints: Patient seen lying in bed this morning. He slept well overnight. He denies complaints.  ROS: Denies CP, SOB, N/V/D  Objective: Vital Signs: Blood pressure (!) 141/81, pulse 70, temperature 97.8 F (36.6 C), temperature source Oral, resp. rate 19, height 5\' 9"  (1.753 m), weight 109.3 kg (241 lb), SpO2 95 %. No results found. Results for orders placed or performed during the hospital encounter of 02/14/17 (from the past 72 hour(s))  Urine Culture     Status: None   Collection Time: 02/21/17  7:22 AM  Result Value Ref Range   Specimen Description URINE, CLEAN CATCH    Special Requests NONE    Culture NO GROWTH    Report Status 02/22/2017 FINAL   Urinalysis, Routine w reflex microscopic     Status: None   Collection Time: 02/21/17  8:40 AM  Result Value Ref Range   Color, Urine YELLOW YELLOW   APPearance CLEAR CLEAR   Specific Gravity, Urine 1.013 1.005 - 1.030   pH 6.0 5.0 - 8.0   Glucose, UA NEGATIVE NEGATIVE mg/dL   Hgb urine dipstick NEGATIVE NEGATIVE   Bilirubin Urine NEGATIVE NEGATIVE   Ketones, ur NEGATIVE NEGATIVE mg/dL   Protein, ur NEGATIVE NEGATIVE mg/dL   Nitrite NEGATIVE NEGATIVE   Leukocytes, UA NEGATIVE NEGATIVE  Glucose, capillary     Status: None   Collection Time: 02/21/17 11:52 AM  Result Value Ref Range   Glucose-Capillary 94 65 - 99 mg/dL  Glucose, capillary     Status: Abnormal   Collection Time: 02/21/17  4:38 PM  Result Value Ref Range   Glucose-Capillary 116 (H) 65 - 99 mg/dL  Glucose, capillary     Status: Abnormal   Collection Time: 02/21/17  8:49 PM  Result Value Ref Range   Glucose-Capillary 128 (H) 65 - 99 mg/dL  Glucose, capillary     Status: None   Collection Time: 02/22/17  6:22 AM  Result Value Ref Range   Glucose-Capillary 88 65 - 99 mg/dL  Glucose, capillary     Status: None   Collection Time: 02/22/17 11:32 AM  Result Value Ref Range   Glucose-Capillary 92 65 - 99 mg/dL  Glucose, capillary     Status: Abnormal    Collection Time: 02/22/17  4:43 PM  Result Value Ref Range   Glucose-Capillary 104 (H) 65 - 99 mg/dL  Glucose, capillary     Status: Abnormal   Collection Time: 02/22/17  9:24 PM  Result Value Ref Range   Glucose-Capillary 140 (H) 65 - 99 mg/dL   Comment 1 Notify RN   Glucose, capillary     Status: None   Collection Time: 02/23/17  6:26 AM  Result Value Ref Range   Glucose-Capillary 99 65 - 99 mg/dL   Comment 1 Notify RN   Glucose, capillary     Status: None   Collection Time: 02/23/17 11:19 AM  Result Value Ref Range   Glucose-Capillary 85 65 - 99 mg/dL  Glucose, capillary     Status: Abnormal   Collection Time: 02/23/17  4:16 PM  Result Value Ref Range   Glucose-Capillary 141 (H) 65 - 99 mg/dL     HEENT: normocephalic, atraumatic Eyes: EOMI. Injected sclera. No discharge. Cardio: RRR and no JVD Resp: CTA B/L and unlabored. +[place] GI: BS positive.  + colostomy  Skin: stasis dermatitis BLE. Midline abd wound with dressing.  Neuro: Alert and oriented. Motor 4/5 grossly throughout (stable) Musc/Skel:  No edema. No tenderness.  Psych: Flat. Normal behavior  Assessment/Plan: 1. Functional deficits secondary to debility which require 3+ hours per day of interdisciplinary therapy in a comprehensive inpatient rehab setting. Physiatrist is providing close team supervision and 24 hour management of active medical problems listed below. Physiatrist and rehab team continue to assess barriers to discharge/monitor patient progress toward functional and medical goals. FIM: Function - Bathing Position: Wheelchair/chair at sink Body parts bathed by patient: Right upper leg, Left upper leg, Right lower leg, Left lower leg, Buttocks, Front perineal area Body parts bathed by helper: Back, Left lower leg, Right lower leg, Left upper leg, Right upper leg Bathing not applicable: Right arm, Left arm, Chest, Abdomen Assist Level: Touching or steadying assistance(Pt > 75%)  Function- Upper Body  Dressing/Undressing What is the patient wearing?: Pull over shirt/dress Pull over shirt/dress - Perfomed by patient: Thread/unthread right sleeve, Thread/unthread left sleeve, Put head through opening, Pull shirt over trunk Pull over shirt/dress - Perfomed by helper: Pull shirt over trunk Assist Level: Supervision or verbal cues Function - Lower Body Dressing/Undressing What is the patient wearing?: Pants Position: Wheelchair/chair at sink Underwear - Performed by patient: Thread/unthread left underwear leg, Pull underwear up/down Underwear - Performed by helper: Thread/unthread right underwear leg Pants- Performed by patient: Thread/unthread right pants leg, Pull pants up/down Pants- Performed by helper: Thread/unthread left pants leg Non-skid slipper socks- Performed by helper: Don/doff right sock, Don/doff left sock Assist for footwear: Dependant Assist for lower body dressing: Touching or steadying assistance (Pt > 75%)  Function - Toileting Toileting activity did not occur: No continent bowel/bladder event Toileting steps completed by patient: Adjust clothing prior to toileting, Performs perineal hygiene Toileting steps completed by helper: Adjust clothing after toileting Toileting Assistive Devices: Grab bar or rail Assist level: Touching or steadying assistance (Pt.75%)  Function - Air cabin crew transfer assistive device: Elevated toilet seat/BSC over toilet, Grab bar Assist level to toilet: Touching or steadying assistance (Pt > 75%) Assist level from toilet: Touching or steadying assistance (Pt > 75%)  Function - Chair/bed transfer Chair/bed transfer method: Ambulatory Chair/bed transfer assist level: Supervision or verbal cues Chair/bed transfer assistive device: Armrests, Walker Chair/bed transfer details: Verbal cues for precautions/safety, Verbal cues for technique  Function - Locomotion: Wheelchair Will patient use wheelchair at discharge?: No Type:  Manual Max wheelchair distance: 38' Assist Level: Supervision or verbal cues Assist Level: Supervision or verbal cues Function - Locomotion: Ambulation Assistive device: Walker-rolling Max distance: 63' Assist level: Supervision or verbal cues Assist level: Supervision or verbal cues Walk 50 feet with 2 turns activity did not occur: Safety/medical concerns Assist level: Supervision or verbal cues Walk 150 feet activity did not occur: Safety/medical concerns Walk 10 feet on uneven surfaces activity did not occur: Safety/medical concerns  Function - Comprehension Comprehension: Auditory Comprehension assist level: Follows basic conversation/direction with no assist  Function - Expression Expression: Verbal Expression assist level: Expresses complex ideas: With no assist  Function - Social Interaction Social Interaction assist level: Interacts appropriately 50 - 74% of the time - May be physically or verbally inappropriate.  Function - Problem Solving Problem solving assist level: Solves basic 50 - 74% of the time/requires cueing 25 - 49% of the time  Function - Memory Memory assist level: Recognizes or recalls 50 - 74% of the time/requires cueing 25 - 49% of the time Patient normally able to recall (first 3 days only): Staff names and faces, That he or she is in a hospital, Location of own room  Medical Problem List and Plan: 1.  Decreased functional mobility debilitation/encephalopathysecondary to colon cancer status post colectomy with creation of descending colostomy, patchy acute small left MCA and MCA/ACA territory infarcts likely embolic  Cont CIR  2. DVT Prophylaxis/Anticoagulation: Eliquis 3. Pain Management: Tylenol as needed 4. Mood: Provide emotional support 5. Neuropsych: This patient iscapable of making decisions on hisown behalf. 6. Skin/Wound Care: Routine skin checks 7. Fluids/Electrolytes/Nutrition: Routine I&O's 8.ABLA.   Hb 9.6 on 8/24  Labs ordered  for tomorrow 9.Hypertension. Coreg 3.125 mg twice a day, Lasix 40 mg daily Vitals:   02/23/17 1437 02/24/17 0529  BP: (!) 115/54 (!) 141/81  Pulse: 64 70  Resp: 20 19  Temp: 98.3 F (36.8 C) 97.8 F (36.6 C)  SpO2: 95% 95%   Relatively controlled on 9/2 10.CAD/CABG/AVR 2006. Follow-up cardiology services as needed. Continue anticoagulation 11.Type 2 diabetes mellitus. Hemoglobin A1c 6.4.SSI.Check blood sugars before meals and at bedtime CBG (last 3)    Recent Labs  02/23/17 0626 02/23/17 1119 02/23/17 1616  GLUCAP 99 85 141*   Resumed glimipride   Relatively controlled 9/2 12.Hyperlipidemia. Lipitor  13. Incidental finding meningioma follow-up with neurology as outpatient 14.  On IV abx for ?PNA vs intra abdominal infx, changed to oral but given his dysarthria, concerns for po switched back to IV Unasyn completed on 8/31. 15.  Mental status changes-likely multifactorial, reviewed meds, has rectal CA which may cause fatigue as will CVA, as will disruption in perfusion due to ICA stenosis.    UA unremarkable, Ucx No growth  CXR reviewed, overall improved  Repeat CT head reviewed, negative for acute changes.    LOS (Days) 10 A FACE TO FACE EVALUATION WAS PERFORMED  Ankit Lorie Phenix 02/24/2017, 6:54 AM

## 2017-02-24 NOTE — Progress Notes (Signed)
Physical Therapy Session Note  Patient Details  Name: WADDELL ITEN MRN: [id number] Date of Birth: [birth year]  Today's Date: 02/24/2017 PT Individual Time: 1345-1415 PT Individual Time Calculation (min): 30 min   Short Term Goals: Week 2:  PT Short Term Goal 1 (Week 2): =LTGs due to ELOS  Skilled Therapeutic Interventions/Progress Updates:    Pt seated in w/c upon PT arrival, agreeable to therapy tx and denies pain. Pt ambulated 2 x 80 ft using RW and supervision working on endurance and activity tolerance, verbal cues for step length and RW management. Worked on dynamic standing balance with intermittent UE support on RW in order to perform toe taps on colored cups on the floor, pt requiring min assist and requiring use of RW to maintain balance with toes taps. Pt performed tandem walking forwards and backwards x 15 ft using RW for UE support with min assist to work on dynamic balance. Pt reported needing to use the bathroom. Pt transported back to room in w/c total assist. Pt ambulated x 10 ft to/from bathroom and performed all toileting with supervision. Pt left seated in w/c at end of session with needs in reach.   Therapy Documentation Precautions:  Precautions Precautions: Fall Precaution Comments: deconditioning Restrictions Weight Bearing Restrictions: No   See Function Navigator for Current Functional Status.   Therapy/Group: Individual Therapy  Netta Corrigan, PT, DPT 02/24/2017, 1:57 PM

## 2017-02-24 NOTE — Progress Notes (Signed)
Occupational Therapy Session Note  Patient Details  Name: Terry Wu MRN: [id number] Date of Birth: 1932-01-18  Today's Date: 02/24/2017 OT Individual Time: 4944-9675 OT Individual Time Calculation (min): 62 min    Short Term Goals: Week 1:  OT Short Term Goal 1 (Week 1): Pt will stand at sink to groom wiht supervision OT Short Term Goal 2 (Week 1): Pt will thread BLE into pants using AE PRN OT Short Term Goal 3 (Week 1): Pt will sit to stnad wiht MIN A in prep for clohting managment OT Short Term Goal 4 (Week 1): Pt will complete walk in shower transfer with min A OT Short Term Goal 5 (Week 1): Pt will transfer to BSC/toilet wiht min A  Skilled Therapeutic Interventions/Progress Updates:    Tx focus on standing balance, UB strengthening, and functional ambulation with device during functional tasks.   Pt greeted in w/c with family present. Agreeable to tx with family encouragement. Pt escorted from [place] hallway to outdoor environment. Pt self propelling over uneven surfaces with Min A for inclines. He stood with min guard-supervision and RW without UE support, engaged in throw/catch activity with relative. Pt able to stand for 2 minute windows before fatiguing. 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 Pt ambulated around the fountain circle with close supervision-Min A and 1 small LOB, which he corrected. Sit<stand transitions from community bench with close supervision. Once he was escorted back into building, had him propel w/c up [employer] ramp. Able to propel approx 50 ft before fatiguing and requesting for OT to escort him to room. At end of tx pt was left with family and all needs within reach.   02 sats on 1L >96% during session.   Therapy Documentation Precautions:  Precautions Precautions: Fall Precaution Comments: deconditioning Restrictions Weight Bearing Restrictions: No Pain: No c/o pain during tx    ADL:       See  Function Navigator for Current Functional Status.   Therapy/Group: Individual Therapy  Odysseus Cada A Dorea Duff 02/24/2017, 3:49 PM

## 2017-02-24 NOTE — Progress Notes (Signed)
Occupational Therapy Session Note  Patient Details  Name: Terry Wu MRN: [id number] Date of Birth: 07/06/1931  Today's Date: 02/24/2017 OT Individual Time: 0700-0758 OT Individual Time Calculation (min): 58 min    Skilled Therapeutic Interventions/Progress Updates:    1:1. Pt agreeable to bathe and dress at sink level. Pt completes bathing and dressing with supervision at sit to stand level from w/c pt requires Vc for sequencing body parts, safety awareness such as locking w/c brakes before standing/reaching, and question cues to initiate reacher use to don pants. As OT checks brief for wetness, pt begins voiding urine. Pt states, "yes I can feel when I have to go." And OT educates to alert staff to assist ti restroom with urgency. Pt requests to eat breakfast with supervision and VC for small bites and sips per SLP swallowing plan to end session.   Therapy Documentation Precautions:  Precautions Precautions: Fall Precaution Comments: deconditioning Restrictions Weight Bearing Restrictions: No  See Function Navigator for Current Functional Status.   Therapy/Group: Individual Therapy  Tonny Branch 02/24/2017, 7:50 AM

## 2017-02-25 ENCOUNTER — Inpatient Hospital Stay (HOSPITAL_COMMUNITY): Payer: Medicare Other | Admitting: Physical Therapy

## 2017-02-25 ENCOUNTER — Inpatient Hospital Stay (HOSPITAL_COMMUNITY): Payer: Medicare Other | Admitting: Occupational Therapy

## 2017-02-25 ENCOUNTER — Inpatient Hospital Stay (HOSPITAL_COMMUNITY): Payer: Medicare Other

## 2017-02-25 ENCOUNTER — Inpatient Hospital Stay (HOSPITAL_COMMUNITY): Payer: Medicare Other | Admitting: Speech Pathology

## 2017-02-25 LAB — CBC WITH DIFFERENTIAL/PLATELET
Basophils Absolute: 0 10*3/uL (ref 0.0–0.1)
Basophils Relative: 0 %
EOS ABS: 0.3 10*3/uL (ref 0.0–0.7)
EOS PCT: 4 %
HCT: 34.2 % — ABNORMAL LOW (ref 39.0–52.0)
Hemoglobin: 10.9 g/dL — ABNORMAL LOW (ref 13.0–17.0)
LYMPHS ABS: 1.5 10*3/uL (ref 0.7–4.0)
Lymphocytes Relative: 25 %
MCH: 30.1 pg (ref 26.0–34.0)
MCHC: 31.9 g/dL (ref 30.0–36.0)
MCV: 94.5 fL (ref 78.0–100.0)
MONOS PCT: 16 %
Monocytes Absolute: 1 10*3/uL (ref 0.1–1.0)
Neutro Abs: 3.4 10*3/uL (ref 1.7–7.7)
Neutrophils Relative %: 55 %
Platelets: 181 10*3/uL (ref 150–400)
RBC: 3.62 MIL/uL — ABNORMAL LOW (ref 4.22–5.81)
RDW: 13.8 % (ref 11.5–15.5)
WBC: 6.2 10*3/uL (ref 4.0–10.5)

## 2017-02-25 LAB — BASIC METABOLIC PANEL
Anion gap: 8 (ref 5–15)
BUN: 7 mg/dL (ref 6–20)
CHLORIDE: 97 mmol/L — AB (ref 101–111)
CO2: 31 mmol/L (ref 22–32)
CREATININE: 0.85 mg/dL (ref 0.61–1.24)
Calcium: 9 mg/dL (ref 8.9–10.3)
GFR calc Af Amer: >60 mL/min (ref >60)
GFR calc non Af Amer: >60 mL/min (ref >60)
Glucose, Bld: 109 mg/dL — ABNORMAL HIGH (ref 65–99)
Potassium: 3.6 mmol/L (ref 3.5–5.1)
SODIUM: 136 mmol/L (ref 135–145)

## 2017-02-25 LAB — GLUCOSE, CAPILLARY
GLUCOSE-CAPILLARY: 92 mg/dL (ref 65–99)
Glucose-Capillary: 86 mg/dL (ref 65–99)
Glucose-Capillary: 103 mg/dL — ABNORMAL HIGH (ref 65–99)
Glucose-Capillary: 137 mg/dL — ABNORMAL HIGH (ref 65–99)

## 2017-02-25 NOTE — Progress Notes (Signed)
Subjective/Complaints: Pt seen sitting up in his chair this AM eating breakfast.  He slept well overnight.  He states he remembers me, but does not recall what service I work with.   ROS: Denies CP, SOB, N/V/D  Objective: Vital Signs: Blood pressure (!) 141/78, pulse 80, temperature 98.2 F (36.8 C), temperature source Oral, resp. rate 18, height 5\' 9"  (1.753 m), weight 108.4 kg (239 lb), SpO2 96 %. No results found. Results for orders placed or performed during the hospital encounter of 02/14/17 (from the past 72 hour(s))  Glucose, capillary     Status: None   Collection Time: 02/22/17 11:32 AM  Result Value Ref Range   Glucose-Capillary 92 65 - 99 mg/dL  Glucose, capillary     Status: Abnormal   Collection Time: 02/22/17  4:43 PM  Result Value Ref Range   Glucose-Capillary 104 (H) 65 - 99 mg/dL  Glucose, capillary     Status: Abnormal   Collection Time: 02/22/17  9:24 PM  Result Value Ref Range   Glucose-Capillary 140 (H) 65 - 99 mg/dL   Comment 1 Notify RN   Glucose, capillary     Status: None   Collection Time: 02/23/17  6:26 AM  Result Value Ref Range   Glucose-Capillary 99 65 - 99 mg/dL   Comment 1 Notify RN   Glucose, capillary     Status: None   Collection Time: 02/23/17 11:19 AM  Result Value Ref Range   Glucose-Capillary 85 65 - 99 mg/dL  Glucose, capillary     Status: Abnormal   Collection Time: 02/23/17  4:16 PM  Result Value Ref Range   Glucose-Capillary 141 (H) 65 - 99 mg/dL  Glucose, capillary     Status: None   Collection Time: 02/24/17  6:59 AM  Result Value Ref Range   Glucose-Capillary 99 65 - 99 mg/dL  Glucose, capillary     Status: Abnormal   Collection Time: 02/24/17 11:29 AM  Result Value Ref Range   Glucose-Capillary 103 (H) 65 - 99 mg/dL   Comment 1 Document in Chart   Glucose, capillary     Status: Abnormal   Collection Time: 02/24/17  4:40 PM  Result Value Ref Range   Glucose-Capillary 123 (H) 65 - 99 mg/dL  Glucose, capillary     Status:  Abnormal   Collection Time: 02/24/17  8:44 PM  Result Value Ref Range   Glucose-Capillary 120 (H) 65 - 99 mg/dL  Glucose, capillary     Status: None   Collection Time: 02/25/17  6:47 AM  Result Value Ref Range   Glucose-Capillary 92 65 - 99 mg/dL     HEENT: normocephalic, atraumatic Eyes: EOMI. Injected sclera. No discharge. Cardio: RRR and no JVD Resp: CTA B/L and unlabored. GI: BS positive.  +colostomy  Skin: stasis dermatitis BLE. Midline abd wound with dressing.  Neuro: Alert and oriented x3, but slightly confused. Motor 4/5 grossly throughout (unchanged) Musc/Skel:  No edema. No tenderness.  Psych: Flat. Slowed.   Assessment/Plan: 1. Functional deficits secondary to debility which require 3+ hours per day of interdisciplinary therapy in a comprehensive inpatient rehab setting. Physiatrist is providing close team supervision and 24 hour management of active medical problems listed below. Physiatrist and rehab team continue to assess barriers to discharge/monitor patient progress toward functional and medical goals. FIM: Function - Bathing Position: Wheelchair/chair at sink Body parts bathed by patient: Right upper leg, Left upper leg, Right lower leg, Left lower leg, Buttocks, Front perineal area Body parts bathed  by helper: Back, Left lower leg, Right lower leg, Left upper leg, Right upper leg Bathing not applicable: Right arm, Left arm, Chest, Abdomen Assist Level: Touching or steadying assistance(Pt > 75%)  Function- Upper Body Dressing/Undressing What is the patient wearing?: Pull over shirt/dress Pull over shirt/dress - Perfomed by patient: Thread/unthread right sleeve, Thread/unthread left sleeve, Put head through opening, Pull shirt over trunk Pull over shirt/dress - Perfomed by helper: Pull shirt over trunk Assist Level: Supervision or verbal cues Function - Lower Body Dressing/Undressing What is the patient wearing?: Pants Position: Wheelchair/chair at  sink Underwear - Performed by patient: Thread/unthread left underwear leg, Pull underwear up/down Underwear - Performed by helper: Thread/unthread right underwear leg Pants- Performed by patient: Thread/unthread right pants leg, Pull pants up/down Pants- Performed by helper: Thread/unthread left pants leg Non-skid slipper socks- Performed by helper: Don/doff right sock, Don/doff left sock Assist for footwear: Dependant Assist for lower body dressing: Touching or steadying assistance (Pt > 75%)  Function - Toileting Toileting activity did not occur: No continent bowel/bladder event Toileting steps completed by patient: Performs perineal hygiene Toileting steps completed by helper: Adjust clothing prior to toileting Toileting Assistive Devices: Grab bar or rail Assist level: Touching or steadying assistance (Pt.75%)  Function - Air cabin crew transfer assistive device: Elevated toilet seat/BSC over toilet, Walker Assist level to toilet: Touching or steadying assistance (Pt > 75%) Assist level from toilet: Touching or steadying assistance (Pt > 75%)  Function - Chair/bed transfer Chair/bed transfer method: Ambulatory Chair/bed transfer assist level: Supervision or verbal cues Chair/bed transfer assistive device: Armrests, Walker Chair/bed transfer details: Verbal cues for precautions/safety, Verbal cues for technique  Function - Locomotion: Wheelchair Will patient use wheelchair at discharge?: No Type: Manual Max wheelchair distance: 60' Assist Level: Supervision or verbal cues Assist Level: Supervision or verbal cues Function - Locomotion: Ambulation Assistive device: Walker-rolling Max distance: 80 ft Assist level: Supervision or verbal cues Assist level: Supervision or verbal cues Walk 50 feet with 2 turns activity did not occur: Safety/medical concerns Assist level: Supervision or verbal cues Walk 150 feet activity did not occur: Safety/medical concerns Walk 10 feet  on uneven surfaces activity did not occur: Safety/medical concerns  Function - Comprehension Comprehension: Auditory Comprehension assist level: Follows complex conversation/direction with no assist  Function - Expression Expression: Verbal Expression assist level: Expresses basic 90% of the time/requires cueing < 10% of the time.  Function - Social Interaction Social Interaction assist level: Interacts appropriately 75 - 89% of the time - Needs redirection for appropriate language or to initiate interaction.  Function - Problem Solving Problem solving assist level: Solves basic 50 - 74% of the time/requires cueing 25 - 49% of the time  Function - Memory Memory assist level: Recognizes or recalls 75 - 89% of the time/requires cueing 10 - 24% of the time Patient normally able to recall (first 3 days only): Current season, Location of own room, Staff names and faces, That he or she is in a hospital  Medical Problem List and Plan: 1. Decreased functional mobility debilitation/encephalopathysecondary to colon cancer status post colectomy with creation of descending colostomy, patchy acute small left MCA and MCA/ACA territory infarcts likely embolic  Cont CIR  2. DVT Prophylaxis/Anticoagulation: Eliquis 3. Pain Management: Tylenol as needed 4. Mood: Provide emotional support 5. Neuropsych: This patient iscapable of making decisions on hisown behalf. 6. Skin/Wound Care: Routine skin checks 7. Fluids/Electrolytes/Nutrition: Routine I&O's 8.ABLA.   Hb 9.6 on 8/24  Labs pending 9.Hypertension. Coreg 3.125 mg  twice a day, Lasix 40 mg daily Vitals:   02/24/17 1600 02/25/17 0550  BP: 114/64 (!) 141/78  Pulse: 73 80  Resp: 18 18  Temp: 98.6 F (37 C) 98.2 F (36.8 C)  SpO2: 97% 96%   Relatively controlled on 9/3 10.CAD/CABG/AVR 2006. Follow-up cardiology services as needed. Continue anticoagulation 11.Type 2 diabetes mellitus. Hemoglobin A1c 6.4.SSI.Check blood sugars before  meals and at bedtime CBG (last 3)    Recent Labs  02/24/17 1640 02/24/17 2044 02/25/17 0647  GLUCAP 123* 120* 92   Resumed glimipride   Relatively controlled 9/3 12.Hyperlipidemia. Lipitor  13. Incidental finding meningioma follow-up with neurology as outpatient 14.  On IV abx for ?PNA vs intra abdominal infx, changed to oral but given his dysarthria, concerns for po switched back to IV Unasyn completed on 8/31. 15.  Mental status changes-likely multifactorial, reviewed meds, has rectal CA which may cause fatigue as will CVA, as will disruption in perfusion due to ICA stenosis.    UA unremarkable, Ucx No growth  CXR reviewed, overall improved  Repeat CT head reviewed, negative for acute changes.    LOS (Days) 11 A FACE TO FACE EVALUATION WAS PERFORMED  Deanette Tullius Lorie Phenix 02/25/2017, 8:03 AM

## 2017-02-25 NOTE — Progress Notes (Signed)
Occupational Therapy Session Note  Patient Details  Name: Terry Wu MRN: [id number] Date of Birth: Oct 25, 1931  Today's Date: 02/25/2017 OT Individual Time: 1530-1600 OT Individual Time Calculation (min): 30 min    Short Term Goals: Week 1:  OT Short Term Goal 1 (Week 1): Pt will stand at sink to groom wiht supervision OT Short Term Goal 2 (Week 1): Pt will thread BLE into pants using AE PRN OT Short Term Goal 3 (Week 1): Pt will sit to stnad wiht MIN A in prep for clohting managment OT Short Term Goal 4 (Week 1): Pt will complete walk in shower transfer with min A OT Short Term Goal 5 (Week 1): Pt will transfer to BSC/toilet wiht min A   Skilled Therapeutic Interventions/Progress Updates:    Upon entering the room, pt seated in wheelchair with daughter and wife present in room. Therapist educating caregiver on recommendations for upcoming discharge home. OT provided education regarding HHOT recommendation. Caregiver and pt declined and requesting outpatient OT services. OT also discussing energy conservation techniques for self care in home environment. Caregiver and pt verbalizing understanding. OT notified social worker of pt needs and caregiver wishes. Pt remained seated in wheelchair with call bell and all needed items within reach.   Therapy Documentation Precautions:  Precautions Precautions: Fall Precaution Comments: deconditioning Restrictions Weight Bearing Restrictions: No General:   Vital Signs: Therapy Vitals Temp: 98.3 F (36.8 C) Temp Source: Oral Pulse Rate: 66 Resp: 18 BP: 118/61 Patient Position (if appropriate): Sitting Oxygen Therapy SpO2: 99 % O2 Device: Nasal Cannula O2 Flow Rate (L/min): 2 L/min  See Function Navigator for Current Functional Status.   Therapy/Group: Individual Therapy  Gypsy Decant 02/25/2017, 4:08 PM

## 2017-02-25 NOTE — Progress Notes (Signed)
Speech Language Pathology Daily Session Note  Patient Details  Name: Terry Wu MRN: [id number] Date of Birth: 01-09-1932  Today's Date: 02/25/2017 SLP Individual Time: 1025-8527 SLP Individual Time Calculation (min): 55 min  Short Term Goals: Week 2: SLP Short Term Goal 1 (Week 2): Patient will self-monitor and correct errors during functional tasks with supervision verbal cues.  SLP Short Term Goal 2 (Week 2): Patient will recall new, daily information with supervision verbal and visual cues.  SLP Short Term Goal 3 (Week 2): Patient will demonstrate functional problem solving for basic and familiar tasks with supervision verbal cues.  SLP Short Term Goal 4 (Week 2): Patient will consume current diet with minimal overt s/s of aspiration with mod I for use of swallowing compensatory strategies.  Skilled Therapeutic Interventions:  Skilled treatment session focused on cognitive goals. SLP facilitated session by administering the MoCA (version 7.3). Patient scored 25/30 points with a score of 26 or above considered normal. Patient continues to demonstrate deficits in short-term memory. SLP also provided education to the patient in regards to his current swallowing function, diet recommendations, appropriate textures and swallowing compensatory strategies. He verbalized understanding of all information and handouts were also given to reinforce information. Patient left upright in his wheelchair with all needs within reach. Continue with current plan of care.      Function:  Cognition Comprehension Comprehension assist level: Follows basic conversation/direction with no assist  Expression   Expression assist level: Expresses basic needs/ideas: With no assist  Social Interaction Social Interaction assist level: Interacts appropriately 75 - 89% of the time - Needs redirection for appropriate language or to initiate interaction.  Problem Solving Problem solving assist level: Solves basic 75 - 89%  of the time/requires cueing 10 - 24% of the time  Memory Memory assist level: Recognizes or recalls 75 - 89% of the time/requires cueing 10 - 24% of the time    Pain No/Denies Pain   Therapy/Group: Individual Therapy  Mylez Venable 02/25/2017, 12:52 PM

## 2017-02-25 NOTE — Progress Notes (Signed)
Physical Therapy Session Note  Patient Details  Name: Terry Wu MRN: [id number] Date of Birth: 08-29-1931  Today's Date: 02/25/2017 PT Individual Time: 1615-1700 PT Individual Time Calculation (min): 45 min   Short Term Goals: Week 2:  PT Short Term Goal 1 (Week 2): =LTGs due to ELOS  Skilled Therapeutic Interventions/Progress Updates:    Pt seated in w/c upon PT arrival, agreeable to therapy tx and denies pain. Pt requests to use bathroom prior to going to the gym. Pt ambulates from w/c<>toilet x 10 ft using RW with supervision. Pt working on standing balance without UE support to doff/don pants and complete toileting with supervision. Pt ambulated to the sink x 10 ft using RW to wash hands with supervision. Pt ambulated x 100 ft to the ortho gym using RW and supervision. Pt ambulated x 80 ft to the other gym, using RW and supervision. Session focused on dynamic balance and functional strengthening, pt remained on 1L of O2 this session with SpO2 >95%. Pt performed side stepping 4 x 15 ft with HHA and pt performed tandem walking forward/backward 4 x 15 ft using RW. Pt performed 1 x 10 sit<>stands from mat for functional strengthening. Pt ambulated back to room from gym using RW and supervision x 150 ft. Pt left seated in w/c at end of session with needs in reach.   Therapy Documentation Precautions:  Precautions Precautions: Fall Precaution Comments: deconditioning Restrictions Weight Bearing Restrictions: No   See Function Navigator for Current Functional Status.   Therapy/Group: Individual Therapy  Netta Corrigan, PT, DPT 02/25/2017, 4:31 PM

## 2017-02-25 NOTE — Progress Notes (Signed)
Physical Therapy Session Note  Patient Details  Name: Terry Wu MRN: [id number] Date of Birth: 1932-05-18  Today's Date: 02/25/2017 PT Individual Time: 1000-1100 PT Individual Time Calculation (min): 60 min   Short Term Goals: Week 2:  PT Short Term Goal 1 (Week 2): =LTGs due to ELOS  Skilled Therapeutic Interventions/Progress Updates:    no c/o pain.  Session focus on activity tolerance through functional mobility.    Kinetron at 35 cm/s 4 trials to fatigue for reciprocal stepping, strengthening, and activity tolerance.  O2 on room air during activity 84%, returned to 96% when placed on 1L of O2 via nasal cannula.    Stair negotiation 4 steps with 2 rails, close supervision for safety.  Pt states that he needs to use the restroom urgently, returned to room in w/c total assist.   Pt ambulates within room with RW with supervision.  Pt requires set up assist for toileting and upon return to w/c states he needs to use the restroom again.  Returned to bathroom with RW and pt against completes toileting steps with set up assist.    PT applied ted hose and threaded BLEs into pants for time management, pt pulls pants over hips in standing.    Gait training x100' to ortho gym with RW and supervision.  Pt completes car transfer at 32" height to simulate his d/c vehicle, min assist overall.  Pt returned to room with RW and positioned in recliner with LEs elevated for edema management.  Call bell in reach and needs met.   Therapy Documentation Precautions:  Precautions Precautions: Fall Precaution Comments: deconditioning Restrictions Weight Bearing Restrictions: No   See Function Navigator for Current Functional Status.   Therapy/Group: Individual Therapy  Michel Santee 02/25/2017, 11:04 AM

## 2017-02-26 ENCOUNTER — Inpatient Hospital Stay (HOSPITAL_COMMUNITY): Payer: Medicare Other

## 2017-02-26 ENCOUNTER — Inpatient Hospital Stay (HOSPITAL_COMMUNITY): Payer: Medicare Other | Admitting: Physical Therapy

## 2017-02-26 ENCOUNTER — Inpatient Hospital Stay (HOSPITAL_COMMUNITY): Payer: Medicare Other | Admitting: Occupational Therapy

## 2017-02-26 ENCOUNTER — Inpatient Hospital Stay (HOSPITAL_COMMUNITY): Payer: Medicare Other | Admitting: Speech Pathology

## 2017-02-26 LAB — GLUCOSE, CAPILLARY
GLUCOSE-CAPILLARY: 159 mg/dL — AB (ref 65–99)
GLUCOSE-CAPILLARY: 86 mg/dL (ref 65–99)
GLUCOSE-CAPILLARY: 109 mg/dL — AB (ref 65–99)
GLUCOSE-CAPILLARY: 148 mg/dL — AB (ref 65–99)
Glucose-Capillary: 92 mg/dL (ref 65–99)

## 2017-02-26 NOTE — Progress Notes (Signed)
Speech Language Pathology Daily Session Note  Patient Details  Name: Terry Wu MRN: [id number] Date of Birth: 04/08/1932  Today's Date: 02/26/2017 SLP Individual Time: 1130-1200 SLP Individual Time Calculation (min): 30 min  Short Term Goals: Week 2: SLP Short Term Goal 1 (Week 2): Patient will self-monitor and correct errors during functional tasks with supervision verbal cues.  SLP Short Term Goal 2 (Week 2): Patient will recall new, daily information with supervision verbal and visual cues.  SLP Short Term Goal 3 (Week 2): Patient will demonstrate functional problem solving for basic and familiar tasks with supervision verbal cues.  SLP Short Term Goal 4 (Week 2): Patient will consume current diet with minimal overt s/s of aspiration with mod I for use of swallowing compensatory strategies.  Skilled Therapeutic Interventions: Skilled treatment session focused on dysphagia goals and completion of patient and family education. SLP facilitated session by providing skilled observation with lunch meal of Dys. 2 textures with thin liquids. Patient demonstrated overt cough X 1, suspect due to dry, Dys. 3 like texture of the chicken. Patient independently decided not to finish the chicken due to its tough texture and no further coughing was noted throughout meal. Recommend patient continue current diet. Patient's wife present at end of meal and educated on patient's current swallowing function, diet recommendations, appropriate textures and swallowing compensatory strategies. She verbalized understanding of all information. Patient left upright in wheelchair with all needs within reach. Continue with current plan of care.       Function:  Eating Eating   Modified Consistency Diet: Yes Eating Assist Level: More than reasonable amount of time   Eating Set Up Assist For: Opening containers       Cognition Comprehension Comprehension assist level: Follows basic conversation/direction with no  assist  Expression   Expression assist level: Expresses basic needs/ideas: With no assist  Social Interaction Social Interaction assist level: Interacts appropriately 90% of the time - Needs monitoring or encouragement for participation or interaction.  Problem Solving Problem solving assist level: Solves basic 90% of the time/requires cueing < 10% of the time  Memory Memory assist level: Recognizes or recalls 75 - 89% of the time/requires cueing 10 - 24% of the time    Pain Pain Assessment Pain Assessment: No/denies pain Pain Score: 0-No pain  Therapy/Group: Individual Therapy  Jayleana Colberg 02/26/2017, 12:38 PM

## 2017-02-26 NOTE — Progress Notes (Signed)
Physical Therapy Discharge Summary  Patient Details  Name: Terry Wu MRN: [id number] Date of Birth: 1932/01/27  Today's Date: 02/26/2017 PT Individual Time: 1000-1050 PT Individual Time Calculation (min): 50 min   Pt in w/c upon arrival and agreeable to therapy, no c/o pain. Worked on functional mobility and activity tolerance as detailed below while performing bed mobility, ambulation, w/c mobility, and stairs. Pt required multiple seated rest breaks secondary to fatigue while on 2 L/min O2, however no signs or symptoms of physiologic intolerance and decreased work of breathing than previous sessions. Assisted pt in toileting w/ supervision and ended session in w/c, call bell within reach and all needs met.   Patient has met 10 of 11 long term goals due to improved activity tolerance, improved balance, improved postural control, increased strength, improved attention and improved awareness.  Patient to discharge at an ambulatory level Supervision.   Patient's care partner is independent to provide the necessary physical and cognitive assistance at discharge.  Reasons goals not met: Pt requires supervision at this time for bed<>chair transfer secondary to decreased awareness of environment and assist needed to set-up transfer.   Recommendation:  Patient will benefit from ongoing skilled PT services in outpatient setting to continue to advance safe functional mobility, address ongoing impairments in balance, cardiopulmonary endurance, and safety awareness, and minimize fall risk.  Equipment: No equipment provided  Reasons for discharge: treatment goals met  Patient/family agrees with progress made and goals achieved: Yes  PT Discharge Precautions/Restrictions Precautions Precautions: Fall Restrictions Weight Bearing Restrictions: No Pain Pain Assessment Pain Assessment: No/denies pain Pain Score: 0-No pain Vision/Perception  Perception Perception: Within Functional  Limits Praxis Praxis: Intact  Cognition Overall Cognitive Status: Within Functional Limits for tasks assessed Arousal/Alertness: Awake/alert Orientation Level: Oriented X4 Attention: Selective;Sustained Sustained Attention: Appears intact Selective Attention: Appears intact Memory: Appears intact Awareness: Appears intact Safety/Judgment: Appears intact Comments: Increased lethargy  Sensation Sensation Light Touch: Appears Intact Proprioception: Appears Intact Coordination Gross Motor Movements are Fluid and Coordinated: No Fine Motor Movements are Fluid and Coordinated: No Finger Nose Finger Test: undershoots, decreased speed Heel Shin Test: limited by strength  Motor  Motor Motor: Other (comment) Motor - Discharge Observations: generalized weakness  Mobility Bed Mobility Bed Mobility: Rolling Right;Rolling Left;Supine to Sit;Sit to Supine Rolling Right: 4: Min assist Rolling Right Details: Manual facilitation for placement;Manual facilitation for weight shifting Rolling Left: 4: Min assist Rolling Left Details: Manual facilitation for weight shifting;Manual facilitation for placement Supine to Sit: 5: Supervision Sit to Supine: 5: Supervision Transfers Transfers: Yes Sit to Stand: 5: Supervision Sit to Stand Details: Verbal cues for sequencing;Verbal cues for technique Stand to Sit: 5: Supervision Stand to Sit Details (indicate cue type and reason): Verbal cues for sequencing;Verbal cues for technique Stand Pivot Transfers: 5: Supervision Stand Pivot Transfer Details: Verbal cues for sequencing;Verbal cues for technique Locomotion  Ambulation Ambulation: Yes Ambulation/Gait Assistance: 5: Supervision Ambulation Distance (Feet): 100 Feet Assistive device: Rolling walker Gait Gait: Yes Gait Pattern: Impaired Gait Pattern: Decreased stride length;Trunk flexed;Wide base of support;Poor foot clearance - left;Poor foot clearance - right Gait velocity:  decreased Stairs / Additional Locomotion Stairs: Yes Stairs Assistance: 5: Supervision Stair Management Technique: Two rails Number of Stairs: 4 Wheelchair Mobility Wheelchair Mobility: Yes Wheelchair Assistance: 5: Supervision Wheelchair Propulsion: Both upper extremities;Both lower extermities Wheelchair Parts Management: Supervision/cueing Distance: 150'  Trunk/Postural Assessment  Cervical Assessment Cervical Assessment: Within Functional Limits Thoracic Assessment Thoracic Assessment: Exceptions to [facility] (rounded shoulder posture) Lumbar Assessment Lumbar  Assessment: Exceptions to [facility] (posterior pelvic tilt) Postural Control Postural Control: Within Functional Limits  Balance Balance Balance Assessed: Yes Static Sitting Balance Static Sitting - Balance Support: No upper extremity supported Static Sitting - Level of Assistance: 6: Modified independent (Device/Increase time) Static Standing Balance Static Standing - Balance Support: Bilateral upper extremity supported Static Standing - Level of Assistance: 5: Stand by assistance Dynamic Standing Balance Dynamic Standing - Balance Support: Bilateral upper extremity supported Dynamic Standing - Level of Assistance: 5: Stand by assistance Extremity Assessment  RUE Assessment RUE Assessment: Not tested (Defer to OT) LUE Assessment LUE Assessment: Not tested (Defer to OT) RLE Assessment RLE Assessment: Exceptions to [facility] (4 to 4+/5 globally) LLE Assessment LLE Assessment: Exceptions to [facility] (4 to 4+/5 globally)   See Function Navigator for Current Functional Status.  Ericson Nafziger K Arnette 02/26/2017, 10:37 AM

## 2017-02-26 NOTE — Consult Note (Addendum)
[place] Nurse ostomy follow up Surgical team following for assessment and plan of care for abd wound.  Stoma type/location: Colostomy stoma from surgery on 8/16.  Stomal assessment/size: Stoma red and viable, 1 1/2 inches, slightly oval and slightly above skin level.  Peristomal assessment:There is an area of slight mucocutaneous separation beginning at 3:00 o'clock where stitch is pulling.  No open wound at this time.  Output: Modamt semiformed brown stoolin the pouch Ostomy pouching: 1pc.  Education provided: Wife and daughter at the bedside for ostomy teaching session.  They were able to apply one piece pouch and barrier ring and open and close the velcro to empty with minimal asstance. Pt watched procedure but did not participate in application. He states he will not be able to perform pouching activities because he cannot see well enough; his eyes are red and swollen and his glasses are not at the bedside. He says his wife will need to apply and empty his pouch.  6 sets of pouches and barrier rings are the bedside for staff nurse use and for patient discharge use. Educational materials left at the bedside. Reviewed pouching routines and ordering supplies. Family members deny further questions. Enrolled patient in [place] Start Discharge program: Yes Julien Girt MSN, RN, [place], [place], [place]

## 2017-02-26 NOTE — Progress Notes (Signed)
Occupational Therapy Discharge Summary  Patient Details  Name: Terry Wu MRN: [id number] Date of Birth: [birth year]   Today's Date: 02/26/2017 OT Individual Time: 1431-1500 OT Individual Time Calculation (min): 29 min   OT treatment session focused on pt/family education and self-care regarding ostomy. Educated pt/family on how to empty ostomy into commode to decrease caregiver burden. Pt ambulated into bathroom and was able to manage clothing mod I. Toilet bowl length and body habitus acted as barriers to safely empty ostomy into commode. Pt states he has longer toilet bowls at home, and this may be an option for home commode depending on size. Educated pt's wife and daughter how pt has been doing with BADL tasks and educated on where to purchase long handled devices to increase independence with LB self-care. Discussed DME needs and dc plan, then pt left seated in wc with family present and needs met.   Patient has met 12 of 12 long term goals due to improved activity tolerance, improved balance, postural control, ability to compensate for deficits, improved attention, improved awareness and improved coordination.  Patient to discharge at overall Mod I Supervision level.  Patient's care partner is independent to provide the necessary physical assistance at discharge.    Reasons goals not met: n/a  Recommendation:  Patient will benefit from ongoing skilled OT services in outpatient setting to continue to advance functional skills in the area of BADL.  Equipment: 3-in-1 BSC  Reasons for discharge: treatment goals met and discharge from hospital  Patient/family agrees with progress made and goals achieved: Yes  OT Discharge Precautions/Restrictions  Precautions Precautions: Fall Precaution Comments: deconditioning Restrictions Weight Bearing Restrictions: No Pain  none/denies pain ADL ADL Equipment Provided: Reacher Grooming: Modified independent Where Assessed-Grooming:  Wheelchair, Sitting at sink Upper Body Bathing: Modified independent Where Assessed-Upper Body Bathing: Wheelchair, Sitting at sink Lower Body Bathing: Modified independent Where Assessed-Lower Body Bathing: Wheelchair, Sitting at sink Upper Body Dressing: Modified independent (Device) Where Assessed-Upper Body Dressing: Wheelchair, Sitting at sink Lower Body Dressing: Minimal assistance Where Assessed-Lower Body Dressing: Wheelchair, Sitting at sink Toileting: Modified independent Where Assessed-Toileting: Risk analyst Method: Counselling psychologist: Grab bars, Raised toilet seat ADL Comments: Please see functional navigator Perception  Perception: Within Functional Limits Praxis Praxis: Intact Cognition Overall Cognitive Status: Within Functional Limits for tasks assessed Arousal/Alertness: Awake/alert Orientation Level: Oriented X4 Safety/Judgment: Appears intact Sensation Sensation Light Touch: Appears Intact Stereognosis: Appears Intact Hot/Cold: Appears Intact Proprioception: Appears Intact Coordination Gross Motor Movements are Fluid and Coordinated: No Fine Motor Movements are Fluid and Coordinated: No Finger Nose Finger Test: undershoots, decreased speed Motor  Motor Motor - Discharge Observations: generalized weakness Mobility  Transfers Sit to Stand: 6: Modified independent (Device/Increase time) Stand to Sit: 6: Modified independent (Device/Increase time)  Balance Balance Balance Assessed: Yes Static Sitting Balance Static Sitting - Balance Support: No upper extremity supported Static Sitting - Level of Assistance: 6: Modified independent (Device/Increase time) Static Standing Balance Static Standing - Balance Support: During functional activity Static Standing - Level of Assistance: 6: Modified independent (Device/Increase time) Dynamic Standing Balance Dynamic Standing - Balance Support:  During functional activity Dynamic Standing - Level of Assistance: 6: Modified independent (Device/Increase time) Extremity/Trunk Assessment RUE Assessment RUE Assessment: Within Functional Limits LUE Assessment LUE Assessment: Within Functional Limits   See Function Navigator for Current Functional Status.  Daneen Schick Jamarques Pinedo 02/26/2017, 3:55 PM

## 2017-02-26 NOTE — Discharge Summary (Signed)
Discharge summary job # [id number]

## 2017-02-26 NOTE — Progress Notes (Signed)
Physical Therapy Session Note  Patient Details  Name: Terry Wu MRN: [id number] Date of Birth: 1931/10/01  Today's Date: 02/26/2017 PT Individual Time: 1600-1630 PT Individual Time Calculation (min): 30 min   Short Term Goals: Week 2:  PT Short Term Goal 1 (Week 2): =LTGs due to ELOS  Skilled Therapeutic Interventions/Progress Updates:    Pt seated in w/c upon PT arrival, agreeable to therapy tx and denies pain. Pt ambulated x 100 ft to the therapy gym using RW and with supervision, verbal cues for increased step length. Session focused on endurance and LE strengthening. Pt seated on mat performed 2 x 10 LAQ, 2 x 10 hip flexion, x 20 ankle pumps and 2 x 10 sit to stands. Pt ambulated x 80 ft and x 100 ft using RW and with supervision to work on activity tolerance and endurance. Pt left seated in w/c at end of session with needs in reach.   Therapy Documentation Precautions:  Precautions Precautions: Fall Precaution Comments: deconditioning Restrictions Weight Bearing Restrictions: No    See Function Navigator for Current Functional Status.   Therapy/Group: Individual Therapy  Netta Corrigan, PT, DPT 02/26/2017, 4:07 PM

## 2017-02-26 NOTE — Progress Notes (Signed)
Subjective/Complaints: Pt awakens to voice but drifts off during conversation, is oriented to person, place and D/C in am C/o urinary incont last noc, no dysuria ROS: Denies CP, SOB, N/V/D  Objective: Vital Signs: Blood pressure 123/76, pulse 98, temperature 98.2 F (36.8 C), temperature source Oral, resp. rate 18, height '5\' 9"'$  (1.753 m), weight 108.4 kg (239 lb), SpO2 95 %. No results found. Results for orders placed or performed during the hospital encounter of 02/14/17 (from the past 72 hour(s))  Glucose, capillary     Status: None   Collection Time: 02/23/17 11:19 AM  Result Value Ref Range   Glucose-Capillary 85 65 - 99 mg/dL  Glucose, capillary     Status: Abnormal   Collection Time: 02/23/17  4:16 PM  Result Value Ref Range   Glucose-Capillary 141 (H) 65 - 99 mg/dL  Glucose, capillary     Status: None   Collection Time: 02/24/17  6:59 AM  Result Value Ref Range   Glucose-Capillary 99 65 - 99 mg/dL  Glucose, capillary     Status: Abnormal   Collection Time: 02/24/17 11:29 AM  Result Value Ref Range   Glucose-Capillary 103 (H) 65 - 99 mg/dL   Comment 1 Document in Chart   Glucose, capillary     Status: Abnormal   Collection Time: 02/24/17  4:40 PM  Result Value Ref Range   Glucose-Capillary 123 (H) 65 - 99 mg/dL  Glucose, capillary     Status: Abnormal   Collection Time: 02/24/17  8:44 PM  Result Value Ref Range   Glucose-Capillary 120 (H) 65 - 99 mg/dL  Glucose, capillary     Status: None   Collection Time: 02/25/17  6:47 AM  Result Value Ref Range   Glucose-Capillary 92 65 - 99 mg/dL  Basic metabolic panel     Status: Abnormal   Collection Time: 02/25/17  7:24 AM  Result Value Ref Range   Sodium 136 135 - 145 mmol/L   Potassium 3.6 3.5 - 5.1 mmol/L   Chloride 97 (L) 101 - 111 mmol/L   CO2 31 22 - 32 mmol/L   Glucose, Bld 109 (H) 65 - 99 mg/dL   BUN 7 6 - 20 mg/dL   Creatinine, Ser 0.85 0.61 - 1.24 mg/dL   Calcium 9.0 8.9 - 10.3 mg/dL   GFR calc non Af Amer  >60 >60 mL/min   GFR calc Af Amer >60 >60 mL/min    Comment: (NOTE) The eGFR has been calculated using the CKD EPI equation. This calculation has not been validated in all clinical situations. eGFR's persistently <60 mL/min signify possible Chronic Kidney Disease.    Anion gap 8 5 - 15  CBC with Differential/Platelet     Status: Abnormal   Collection Time: 02/25/17  7:24 AM  Result Value Ref Range   WBC 6.2 4.0 - 10.5 K/uL   RBC 3.62 (L) 4.22 - 5.81 MIL/uL   Hemoglobin 10.9 (L) 13.0 - 17.0 g/dL   HCT 34.2 (L) 39.0 - 52.0 %   MCV 94.5 78.0 - 100.0 fL   MCH 30.1 26.0 - 34.0 pg   MCHC 31.9 30.0 - 36.0 g/dL   RDW 13.8 11.5 - 15.5 %   Platelets 181 150 - 400 K/uL   Neutrophils Relative % 55 %   Neutro Abs 3.4 1.7 - 7.7 K/uL   Lymphocytes Relative 25 %   Lymphs Abs 1.5 0.7 - 4.0 K/uL   Monocytes Relative 16 %   Monocytes Absolute 1.0 0.1 -  1.0 K/uL   Eosinophils Relative 4 %   Eosinophils Absolute 0.3 0.0 - 0.7 K/uL   Basophils Relative 0 %   Basophils Absolute 0.0 0.0 - 0.1 K/uL  Glucose, capillary     Status: None   Collection Time: 02/25/17 11:40 AM  Result Value Ref Range   Glucose-Capillary 86 65 - 99 mg/dL  Glucose, capillary     Status: Abnormal   Collection Time: 02/25/17  5:04 PM  Result Value Ref Range   Glucose-Capillary 103 (H) 65 - 99 mg/dL  Glucose, capillary     Status: Abnormal   Collection Time: 02/25/17  8:52 PM  Result Value Ref Range   Glucose-Capillary 137 (H) 65 - 99 mg/dL  Glucose, capillary     Status: None   Collection Time: 02/26/17  6:11 AM  Result Value Ref Range   Glucose-Capillary 86 65 - 99 mg/dL     HEENT: normocephalic, atraumatic Eyes: EOMI. Injected sclera. No discharge. Cardio: RRR and no JVD Resp: CTA B/L and unlabored. GI: BS positive.  +colostomy  Skin: stasis dermatitis BLE. Midline abd wound with dressing.  Neuro: Alert and oriented x3, but slightly confused. Motor 4/5 grossly throughout (unchanged) Musc/Skel:  No edema. No  tenderness.  Psych: Flat. Slowed.   Assessment/Plan: 1. Functional deficits secondary to debility which require 3+ hours per day of interdisciplinary therapy in a comprehensive inpatient rehab setting. Physiatrist is providing close team supervision and 24 hour management of active medical problems listed below. Physiatrist and rehab team continue to assess barriers to discharge/monitor patient progress toward functional and medical goals. FIM: Function - Bathing Position: Wheelchair/chair at sink Body parts bathed by patient: Right upper leg, Left upper leg, Right lower leg, Left lower leg, Buttocks, Front perineal area Body parts bathed by helper: Back, Left lower leg, Right lower leg, Left upper leg, Right upper leg Bathing not applicable: Right arm, Left arm, Chest, Abdomen Assist Level: Touching or steadying assistance(Pt > 75%)  Function- Upper Body Dressing/Undressing What is the patient wearing?: Pull over shirt/dress Pull over shirt/dress - Perfomed by patient: Thread/unthread right sleeve, Thread/unthread left sleeve, Put head through opening, Pull shirt over trunk Pull over shirt/dress - Perfomed by helper: Pull shirt over trunk Assist Level: Supervision or verbal cues Function - Lower Body Dressing/Undressing What is the patient wearing?: Pants Position: Wheelchair/chair at sink Underwear - Performed by patient: Thread/unthread left underwear leg, Pull underwear up/down Underwear - Performed by helper: Thread/unthread right underwear leg Pants- Performed by patient: Thread/unthread right pants leg, Pull pants up/down Pants- Performed by helper: Thread/unthread left pants leg Non-skid slipper socks- Performed by helper: Don/doff right sock, Don/doff left sock Assist for footwear: Dependant Assist for lower body dressing: Touching or steadying assistance (Pt > 75%)  Function - Toileting Toileting activity did not occur: No continent bowel/bladder event Toileting steps  completed by patient: Adjust clothing prior to toileting, Performs perineal hygiene Toileting steps completed by helper: Adjust clothing after toileting Toileting Assistive Devices: Grab bar or rail Assist level: Supervision or verbal cues  Function Midwife transfer assistive device: Elevated toilet seat/BSC over toilet, Walker Assist level to toilet: Supervision or verbal cues Assist level from toilet: Supervision or verbal cues  Function - Chair/bed transfer Chair/bed transfer method: Stand pivot Chair/bed transfer assist level: Supervision or verbal cues Chair/bed transfer assistive device: Armrests, Walker Chair/bed transfer details: Verbal cues for precautions/safety, Verbal cues for technique  Function - Locomotion: Wheelchair Will patient use wheelchair at discharge?: No Type: Manual  Max wheelchair distance: 28' Assist Level: Supervision or verbal cues Assist Level: Supervision or verbal cues Function - Locomotion: Ambulation Assistive device: Walker-rolling Max distance: 150 ft Assist level: Supervision or verbal cues Assist level: Supervision or verbal cues Walk 50 feet with 2 turns activity did not occur: Safety/medical concerns Assist level: Supervision or verbal cues Walk 150 feet activity did not occur: Safety/medical concerns Assist level: Supervision or verbal cues Walk 10 feet on uneven surfaces activity did not occur: Safety/medical concerns  Function - Comprehension Comprehension: Auditory Comprehension assist level: Follows basic conversation/direction with no assist  Function - Expression Expression: Verbal Expression assist level: Expresses basic needs/ideas: With no assist  Function - Social Interaction Social Interaction assist level: Interacts appropriately 75 - 89% of the time - Needs redirection for appropriate language or to initiate interaction.  Function - Problem Solving Problem solving assist level: Solves basic 75 - 89% of  the time/requires cueing 10 - 24% of the time  Function - Memory Memory assist level: Recognizes or recalls 75 - 89% of the time/requires cueing 10 - 24% of the time Patient normally able to recall (first 3 days only): Current season, Location of own room, Staff names and faces, That he or she is in a hospital  Medical Problem List and Plan: 1. 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 DVT Prophylaxis/Anticoagulation: Eliquis 3. Pain Management: Tylenol as needed 4. Mood: Provide emotional support 5. Neuropsych: This patient iscapable of making decisions on hisown behalf. 6. Skin/Wound Care: Routine skin checks 7. Fluids/Electrolytes/Nutrition: Routine I&O's 8.ABLA.   Hb 10.9 on 9/3   9.Hypertension. Coreg 3.125 mg twice a day, Lasix 40 mg daily Vitals:   02/25/17 1417 02/26/17 0529  BP: 118/61 123/76  Pulse: 66 98  Resp: 18 18  Temp: 98.3 F (36.8 C) 98.2 F (36.8 C)  SpO2: 99% 95%   controlled on 9/4 10.CAD/CABG/AVR 2006. Follow-up cardiology services as needed. Continue anticoagulation 11.Type 2 diabetes mellitus. Hemoglobin A1c 6.4.SSI.Check blood sugars before meals and at bedtime CBG (last 3)    Recent Labs  02/25/17 1704 02/25/17 2052 02/26/17 0611  GLUCAP 103* 137* 86   Resumed glimipride  controlled 9/4 12.Hyperlipidemia. Lipitor  13. Incidental finding meningioma follow-up with neurology as outpatient 14.  On IV abx for ?PNA vs intra abdominal infx, IV Unasyn completed on 8/31. 15.  Mental status changes-likely multifactorial, reviewed meds, has rectal CA which may cause fatigue as will CVA, as will disruption in perfusion due to ICA stenosis.    UA unremarkable, Ucx No growth  CXR reviewed, overall improved  Repeat CT head reviewed, negative for acute  changes.    Pt may have sleep apnea, may need OP f/u for this, defer to neuro on this LOS (Days) [place] E 02/26/2017, 6:56 AM

## 2017-02-26 NOTE — Discharge Summary (Signed)
NAME:  Terry Wu, Terry Wu NO.:  [id number]  MEDICAL RECORD NO.:  [id number]  LOCATION:  4M03C                        FACILITY:  [facility]  PHYSICIAN:  Charlett Blake, M.D.DATE OF BIRTH:  Dec 12, 1931  DATE OF ADMISSION:  02/14/2017 DATE OF DISCHARGE:  02/27/2017                              DISCHARGE SUMMARY   DISCHARGE DIAGNOSES: 1. Decreased functional mobility secondary to debilitation with     encephalopathy secondary to colon cancer, status post colectomy     with creation of descending colostomy, patchy acute small left MCA     and MCA territory infarcts. 2. Eliquis for DVT prophylaxis. 3. Acute blood loss anemia. 4. Hypertension. 5. Coronary artery disease, CABG with aortic valve replacement in     2006. 6. Type 2 diabetes mellitus. 7. Hyperlipidemia. 8. Incidental findings of meningioma.  Follow up outpatient neurology     services.  HISTORY OF PRESENT ILLNESS:  This is an 83 year old right-handed male with  history of aortic aneurysm, atrial fibrillation, CAD with CABG, aortic valve replacement in 2006, maintained on Coumadin, diabetes mellitus, prostate cancer.  Lives with spouse, independent prior to admission.  Presented on January 30, 2017, with diarrhea x2 weeks, increasing shortness of breath with ambulation.  Denied any abdominal pain.  CT of abdomen and pelvis showed no acute intraabdominal or intrapelvic process identified.  Clostridium difficile negative. Underwent colostomy on February 05, 2017, that showed proximal sigmoid colon, mid sigmoid colon, and descending colon to be normal.  There was a malignant tumor in the distal sigmoid colon, biopsied.  Later underwent sigmoid colectomy, creation of descending colostomy after new diagnosis of adenocarcinoma on February 07, 2017, per General Surgery. Extubated on February 08, 2017.  MRI of the brain due to postoperative confusion, word finding difficulties, showed patchy acute left MCA territory  infarctions, multiple old cerebellar infarctions.  MRA with no emergent large vessel occlusion.  Carotid Dopplers with no ICA stenosis. He was initially on Coumadin for history of aortic valve replacement, transitioned to Eliquis per Cardiology Services.  The patient was admitted for comprehensive rehab program.  PAST MEDICAL HISTORY:  See discharge diagnoses.  SOCIAL HISTORY:  Lives with spouse, independent prior to admission.  FUNCTIONAL STATUS UPON ADMISSION TO REHAB SERVICES:  Minimal assist stand pivot transfers, minimal assist 10 feet rolling walker, mod max assist activities of daily living.  PHYSICAL EXAMINATION:  VITAL SIGNS:  Blood pressure 134/78, pulse 81, temperature 97, and respirations 24. GENERAL:  Alert male, in no acute distress. HEENT:  EOMs intact. NECK:  Supple.  Nontender.  No JVD. CARDIAC:  Rate controlled. ABDOMEN:  Mildly distended.  Nontender.  Colostomy in place. LUNGS:  Clear to auscultation without wheeze.  REHABILITATION HOSPITAL COURSE:  The patient was admitted to Inpatient Rehab Services.  Therapies initiated on a 3-hour daily basis, consisting of physical therapy, occupational therapy, and rehabilitation nursing. The following issues were addressed during the patient's rehabilitation stay.  Pertaining to Mr. Hamm debilitation, encephalopathy due to colon cancer, had a colostomy, findings of patchy acute small left MCA territory infarct, remained stable.  He would follow up with Neurology Services.  He remained on Eliquis for history of aortic valve replacement,  atrial fibrillation.  Blood pressures controlled.  Followed by Cardiology Services.  Continued on Coreg as well as Lasix. Hemoglobin A1c of 6.4, monitoring of blood sugars.  Lipitor for hyperlipidemia.  In regard to any mental status changes, felt to be multifactorial.  Urinalysis negative.  Chest x-ray, overall improved. Repeat CT of the head negative for acute changes.  The patient  received weekly collaborative interdisciplinary team conferences to discuss estimated length of stay, family teaching, any barriers to discharge. The patient was ambulating, rolling walker, supervision.  Working with energy conservation techniques.  Can ambulate up to 80 feet.  Performed sidestepping, handheld assistance.  Could ambulate back to his room after therapy oxygen saturations 95%.  Gather belongings for activities of daily living and homemaking, needing some rest breaks.  Full family teaching was completed and plan discharge to home.  DISCHARGE MEDICATIONS: 1. Eliquis 5 mg p.o. b.i.d. 2. Lipitor 20 mg p.o. daily. 3. Coreg 3.125 mg p.o. b.i.d. 4. Cosopt ophthalmic solution 1 drop both eyes twice daily. 5. Lasix 40 mg p.o. b.i.d. 6. Amaryl 1 mg p.o. daily. 7. Protonix 40 mg p.o. daily. 8. MiraLAX daily.  DIET:  His diet was a dysphagia #2 thin liquid diet.  FOLLOWUP:  The patient would follow up with Dr. Alysia Penna at the Outpatient Rehab Service office as needed basis; Dr. Rozann Lesches, Cardiology Services; Dr. Antony Contras, neurology Service, in 6 weeks; Dr. Donnie Mesa, General Surgery; Dr. Sherren Mocha Early to evaluate right ICA stenosis and possible need for any surgical intervention; and Dr. Betsy Coder, call for appointment to schedule with the [facility]; and Dr. Sinda Du, Medical Management.  SPECIAL INSTRUCTIONS:  Routine colostomy care.     Lauraine Rinne, P.A.   ______________________________ Charlett Blake, M.D.    DA/MEDQ  D:  02/26/2017  T:  02/26/2017  Job:  [id number]  cc:   Satira Sark, MD Pramod P. Leonie Man, MD Charlett Blake, M.D. Imogene Burn. Tsuei, M.D.

## 2017-02-26 NOTE — Progress Notes (Signed)
    CC:  Post op wound check  Subjective: Up in the chair again, on O2.  Abdominal wound is ok, open area still draining some.  The rest of the wound remains in tact, we took out the staples on 02/22/17.   He is to go home tomorrow.   Objective: Vital signs in last 24 hours: Temp:  [98.2 F (36.8 C)-98.3 F (36.8 C)] 98.2 F (36.8 C) (09/04 0529) Pulse Rate:  [66-98] 98 (09/04 0529) Resp:  [18] 18 (09/04 0529) BP: (118-123)/(61-76) 123/76 (09/04 0529) SpO2:  [95 %-99 %] 95 % (09/04 0529) Last BM Date: 02/26/17  Intake/Output from previous day: 09/03 0701 - 09/04 0700 In: 1080 [P.O.:1080] Out: 1475 [Urine:675; Stool:800] Intake/Output this shift: Total I/O In: 240 [P.O.:240] Out: -   General appearance: alert, cooperative and no distress Incision/Wound:  Healing nicely ,no new issues,ostomy working well,  Lab Results:   Recent Labs  02/25/17 0724  WBC 6.2  HGB 10.9*  HCT 34.2*  PLT 181    BMET  Recent Labs  02/25/17 0724  NA 136  K 3.6  CL 97*  CO2 31  GLUCOSE 109*  BUN 7  CREATININE 0.85  CALCIUM 9.0   PT/INR No results for input(s): LABPROT, INR in the last 72 hours.  No results for input(s): AST, ALT, ALKPHOS, BILITOT, PROT, ALBUMIN in the last 168 hours.   Lipase  No results found for: LIPASE   Medications: . apixaban  5 mg Oral BID  . atorvastatin  20 mg Oral q1800  . carvedilol  3.125 mg Oral BID WC  . dorzolamide-timolol  1 drop Both Eyes BID  . furosemide  40 mg Oral BID  . glimepiride  1 mg Oral Q breakfast  . insulin aspart  0-15 Units Subcutaneous TID WC  . insulin aspart  0-5 Units Subcutaneous QHS  . pantoprazole  40 mg Oral Q1200  . polyethylene glycol  17 g Oral Daily    Assessment/Plan  1. CVA L MCA - CIR 2. Colon ca - s/p colectomy and colostomy on 02/07/17 - T2N0 adenocarcinoma - Dr. Donnie Mesa POD 11 3.Partial open wound 4. DVT proph - Eliquis 5. ABLA - monitor CBC 6. HTN - Coreg 7. CAD 8. Dyslipidemia -  Lipitor 9. DM2 FEN: D3 ID: Unasyn 8/24 =>>day 4 DVT: apixaban   Plan:  He can follow up with Dr. Georgette Dover after discharge    LOS: 12 days    Odessia Asleson 02/26/2017 [phone]

## 2017-02-26 NOTE — Progress Notes (Signed)
Occupational Therapy Session Note  Patient Details  Name: Terry Wu MRN: [id number] Date of Birth: [birth year]  Today's Date: 02/26/2017 OT Individual Time: 0800-0900 OT Individual Time Calculation (min): 60 min    Short Term Goals: Week 1:  OT Short Term Goal 1 (Week 1): Pt will stand at sink to groom wiht supervision OT Short Term Goal 2 (Week 1): Pt will thread BLE into pants using AE PRN OT Short Term Goal 3 (Week 1): Pt will sit to stnad wiht MIN A in prep for clohting managment OT Short Term Goal 4 (Week 1): Pt will complete walk in shower transfer with min A OT Short Term Goal 5 (Week 1): Pt will transfer to BSC/toilet wiht min A  Skilled Therapeutic Interventions/Progress Updates:    OT treatment session focused on increased activity tolerance, LB dressing techniques, standing endurance, and dc planning. Pt greeted sitting in recliner finishing breakfast without assistance on 1 L of O2. SpO2 97 at rest. O2 removed for ambulation to bathroom where pt completed 3/3 steps of toileting and voided bladder. Pt desat to 80 on RA with functional ambulation. Educated on deep breathing techniques and placed on 2L for activity with SpO2 maintained throughout session at 92 and above. Blocked practice for donning/doffing pants using long-handled reacher. Pt able to thread B LE's and pull pant sup with supervision and increased time with 2 repetitions. Standing endurance and B UE strengthening/coordination with grooming tasks. Pt tolerate standing for 4 mins to brush teeth, wash hair with hair cap, and comb hair. Pt needed encouragement to try to finish scrubbing scalp, but completed with 1 standing rest break the patient  Then ambulated to ortho gym with RW and supervision. Discussed dc plan and ways to increase independence safely within home environment and pt verbalized understanding. Pt then ambulated back to room where he reported need for the bathroom for 2nd time this session. Pt voided bladder  and managed clothing in similar fashion as above. Pt left seated in recliner with needs met and call bell in reach.   Therapy Documentation Precautions:  Precautions Precautions: Fall Precaution Comments: deconditioning Restrictions Weight Bearing Restrictions: No Pain: Pain Assessment Pain Assessment: No/denies pain Pain Score: 0-No pain  See Function Navigator for Current Functional Status.   Therapy/Group: Individual Therapy  Valma Cava 02/26/2017, 8:59 AM

## 2017-02-26 NOTE — Progress Notes (Signed)
Social Work Discharge Note  The overall goal for the admission was met for:   Discharge location: Yes-13 days  Length of Stay: Yes-supervision-min level  Discharge activity level: Yes  Home/community participation: Yes  Services provided included: MD, RD, PT, OT, SLP, RN, CM, Pharmacy, Neuropsych and SW  Financial Services: Medicare and Private Insurance: [place]  Follow-up services arranged: Home Health: [place] [facility]-PT,OT,RN, DME: [facility]-WHEELCHAIR, ROLLING WALKER, HOME O2, 3IN1 and Patient/Family has no preference for HH/DME agencies  Comments (or additional information):wife and daughter were here for family education and it went well. Both together can provide 24 hr care.  Patient/Family verbalized understanding of follow-up arrangements: Yes  Individual responsible for coordination of the follow-up plan: [place]  Confirmed correct DME delivered: Elease Hashimoto 02/26/2017    Elease Hashimoto

## 2017-02-26 NOTE — Care Management (Signed)
SATURATION QUALIFICATIONS: (This note is used to comply with regulatory documentation for home oxygen)  Patient Saturations on Room Air at Rest = 85%  Patient Saturations on Room Air while Ambulating = 80%  Patient Saturations on 1-2  Liters of oxygen while Ambulating = 90%  Please briefly explain why patient needs home oxygen:pt here for colon cancer resection and post op CVA. He is requiring O2 for sats of 90%.

## 2017-02-26 NOTE — Progress Notes (Signed)
Social Work Patient ID: Terry Wu, male   DOB: 1932/03/05, 83 y.o.   MRN: [id number] Pt requires continuous O2 due to CHF and severe deconditioning, despite lasix and cardiopulmonary rehab on our inpatient rehab unit for the past 2-1/2 weeks

## 2017-02-27 LAB — GLUCOSE, CAPILLARY: Glucose-Capillary: 103 mg/dL — ABNORMAL HIGH (ref 65–99)

## 2017-02-27 MED ORDER — FUROSEMIDE 40 MG PO TABS: 40.0000 mg | ORAL_TABLET | Freq: Two times a day (BID) | ORAL | 0 refills | Status: AC

## 2017-02-27 MED ORDER — GLIMEPIRIDE 1 MG PO TABS: 1.0000 mg | ORAL_TABLET | Freq: Every day | ORAL | 1 refills | Status: AC

## 2017-02-27 MED ORDER — PANTOPRAZOLE SODIUM 40 MG PO TBEC: 40.0000 mg | DELAYED_RELEASE_TABLET | Freq: Every day | ORAL | 3 refills | Status: AC

## 2017-02-27 MED ORDER — APIXABAN 5 MG PO TABS: 5.0000 mg | ORAL_TABLET | Freq: Two times a day (BID) | ORAL | 0 refills | Status: AC

## 2017-02-27 MED ORDER — ATORVASTATIN CALCIUM 20 MG PO TABS: 20.0000 mg | ORAL_TABLET | Freq: Every day | ORAL | 3 refills | Status: AC

## 2017-02-27 MED ORDER — CARVEDILOL 3.125 MG PO TABS: 3.1250 mg | ORAL_TABLET | Freq: Two times a day (BID) | ORAL | 0 refills | Status: AC

## 2017-02-27 MED ORDER — MAGNESIUM OXIDE 400 MG PO TABS: 400.0000 mg | ORAL_TABLET | Freq: Every day | ORAL | 0 refills | Status: AC

## 2017-02-27 NOTE — Progress Notes (Signed)
Speech Language Pathology Discharge Summary  Patient Details  Name: Terry Wu MRN: [id number] Date of Birth: 1932/03/19  Today's Date: 02/27/2017  Patient has met 7 of 7 long term goals.  Patient to discharge at overall Supervision;Min level.   Reasons goals not met: N/A   Clinical Impression/Discharge Summary: Patient has made functional gains and has met 7 of 7 LTG's this admission. Currently, patient requires overall supervision-Min A verbal cues to complete functional and familiar tasks safely in regards to problem solving, recall and awareness. Patient is also Mod I for word-finding but continues to demonstrate decreased speech intelligibility due to poor breath support.  Patient is currently consuming Dys. 2 textures with thin liquids with minimal overt s/s of aspiration and overall Mod I. Patient and family education is complete and patient will discharge home with 24 hour supervision from family. Patient would benefit from f/u SLP services to maximize his swallowing function and reduce caregiver burden.   Care Partner:  Caregiver Able to Provide Assistance: Yes  Type of Caregiver Assistance: Physical;Cognitive  Recommendation:  24 hour supervision/assistance;Home Health SLP  Rationale for SLP Follow Up: Maximize cognitive function and independence;Maximize swallowing safety;Reduce caregiver burden   Equipment: N/A   Reasons for discharge: Discharged from hospital;Treatment goals met   Patient/Family Agrees with Progress Made and Goals Achieved: Yes      Ziyad Dyar, [place] 02/27/2017, 7:01 AM

## 2017-02-27 NOTE — Progress Notes (Signed)
Subjective/Complaints:  ROS: Denies CP, SOB, N/V/D  Objective: Vital Signs: Blood pressure 115/66, pulse 78, temperature 98.2 F (36.8 C), temperature source Oral, resp. rate 19, height _0  (1.753 m), weight 107.5 kg (236 lb 14.2 oz), SpO2 100 %. No results found. Results for orders placed or performed during the hospital encounter of 02/14/17 (from the past 72 hour(s))  Glucose, capillary     Status: Abnormal   Collection Time: 02/24/17 11:29 AM  Result Value Ref Range   Glucose-Capillary 103 (H) 65 - 99 mg/dL   Comment 1 Document in Chart   Glucose, capillary     Status: Abnormal   Collection Time: 02/24/17  4:40 PM  Result Value Ref Range   Glucose-Capillary 123 (H) 65 - 99 mg/dL  Glucose, capillary     Status: Abnormal   Collection Time: 02/24/17  8:44 PM  Result Value Ref Range   Glucose-Capillary 120 (H) 65 - 99 mg/dL  Glucose, capillary     Status: None   Collection Time: 02/25/17  6:47 AM  Result Value Ref Range   Glucose-Capillary 92 65 - 99 mg/dL  Basic metabolic panel     Status: Abnormal   Collection Time: 02/25/17  7:24 AM  Result Value Ref Range   Sodium 136 135 - 145 mmol/L   Potassium 3.6 3.5 - 5.1 mmol/L   Chloride 97 (L) 101 - 111 mmol/L   CO2 31 22 - 32 mmol/L   Glucose, Bld 109 (H) 65 - 99 mg/dL   BUN 7 6 - 20 mg/dL   Creatinine, Ser 0.85 0.61 - 1.24 mg/dL   Calcium 9.0 8.9 - 10.3 mg/dL   GFR calc non Af Amer >60 >60 mL/min   GFR calc Af Amer >60 >60 mL/min    Comment: (NOTE) The eGFR has been calculated using the CKD EPI equation. This calculation has not been validated in all clinical situations. eGFR's persistently <60 mL/min signify possible Chronic Kidney Disease.    Anion gap 8 5 - 15  CBC with Differential/Platelet     Status: Abnormal   Collection Time: 02/25/17  7:24 AM  Result Value Ref Range   WBC 6.2 4.0 - 10.5 K/uL   RBC 3.62 (L) 4.22 - 5.81 MIL/uL   Hemoglobin 10.9 (L) 13.0 - 17.0 g/dL   HCT 34.2 (L) 39.0 - 52.0 %   MCV 94.5  78.0 - 100.0 fL   MCH 30.1 26.0 - 34.0 pg   MCHC 31.9 30.0 - 36.0 g/dL   RDW 13.8 11.5 - 15.5 %   Platelets 181 150 - 400 K/uL   Neutrophils Relative % 55 %   Neutro Abs 3.4 1.7 - 7.7 K/uL   Lymphocytes Relative 25 %   Lymphs Abs 1.5 0.7 - 4.0 K/uL   Monocytes Relative 16 %   Monocytes Absolute 1.0 0.1 - 1.0 K/uL   Eosinophils Relative 4 %   Eosinophils Absolute 0.3 0.0 - 0.7 K/uL   Basophils Relative 0 %   Basophils Absolute 0.0 0.0 - 0.1 K/uL  Glucose, capillary     Status: None   Collection Time: 02/25/17 11:40 AM  Result Value Ref Range   Glucose-Capillary 86 65 - 99 mg/dL  Glucose, capillary     Status: Abnormal   Collection Time: 02/25/17  5:04 PM  Result Value Ref Range   Glucose-Capillary 103 (H) 65 - 99 mg/dL  Glucose, capillary     Status: Abnormal   Collection Time: 02/25/17  8:52 PM  Result Value  Ref Range   Glucose-Capillary 137 (H) 65 - 99 mg/dL  Glucose, capillary     Status: None   Collection Time: 02/26/17  6:11 AM  Result Value Ref Range   Glucose-Capillary 86 65 - 99 mg/dL  Glucose, capillary     Status: None   Collection Time: 02/26/17 11:26 AM  Result Value Ref Range   Glucose-Capillary 92 65 - 99 mg/dL  Glucose, capillary     Status: Abnormal   Collection Time: 02/26/17  4:37 PM  Result Value Ref Range   Glucose-Capillary 109 (H) 65 - 99 mg/dL  Glucose, capillary     Status: Abnormal   Collection Time: 02/26/17  9:10 PM  Result Value Ref Range   Glucose-Capillary 148 (H) 65 - 99 mg/dL  Glucose, capillary     Status: Abnormal   Collection Time: 02/27/17  6:54 AM  Result Value Ref Range   Glucose-Capillary 103 (H) 65 - 99 mg/dL     HEENT: normocephalic, atraumatic Eyes: EOMI. Injected sclera. No discharge. Cardio: RRR and no JVD Resp: CTA B/L and unlabored. GI: BS positive.  +colostomy  Skin: stasis dermatitis BLE. Midline abd wound with dressing.  Neuro: Alert and oriented x3, but slightly confused. Motor 4/5 grossly throughout  (unchanged) Musc/Skel:  No edema. No tenderness.  Psych: Flat. Slowed.   Assessment/Plan: 1. Functional deficits secondary to debility Stable for D/C today F/u PCP in 2 weeks F/u Gen Surgery1- 2 weeks See D/C summary See D/C instructions FIM: Function - Bathing Position: Wheelchair/chair at sink Body parts bathed by patient: Right arm, Left arm, Chest, Abdomen, Front perineal area, Buttocks, Right upper leg, Left upper leg, Right lower leg, Left lower leg Body parts bathed by helper: Back, Left lower leg, Right lower leg, Left upper leg, Right upper leg Bathing not applicable: Right arm, Left arm, Chest, Abdomen Assist Level: Supervision or verbal cues, Assistive device Assistive Device Comment: long handled sponge  Function- Upper Body Dressing/Undressing What is the patient wearing?: Pull over shirt/dress Pull over shirt/dress - Perfomed by patient: Thread/unthread right sleeve, Put head through opening, Thread/unthread left sleeve, Pull shirt over trunk Pull over shirt/dress - Perfomed by helper: Pull shirt over trunk Assist Level: More than reasonable time Function - Lower Body Dressing/Undressing What is the patient wearing?: Pants, Non-skid slipper socks Position: Wheelchair/chair at sink Underwear - Performed by patient: Thread/unthread left underwear leg, Pull underwear up/down Underwear - Performed by helper: Thread/unthread right underwear leg Pants- Performed by patient: Thread/unthread right pants leg, Thread/unthread left pants leg, Pull pants up/down Pants- Performed by helper: Thread/unthread left pants leg Non-skid slipper socks- Performed by helper: Don/doff right sock, Don/doff left sock Assist for footwear: Maximal assist Assist for lower body dressing: Touching or steadying assistance (Pt > 75%)  Function - Toileting Toileting activity did not occur: No continent bowel/bladder event Toileting steps completed by patient: Adjust clothing prior to toileting,  Performs perineal hygiene, Adjust clothing after toileting Toileting steps completed by helper: Adjust clothing after toileting Toileting Assistive Devices: Grab bar or rail Assist level: Supervision or verbal cues  Function - Air cabin crew transfer assistive device: Elevated toilet seat/BSC over toilet Assist level to toilet: No Help, no cues, assistive device, takes more than a reasonable amount of time Assist level from toilet: No Help, no cues, assistive device, takes more than a reasonable amount of time  Function - Chair/bed transfer Chair/bed transfer method: Stand pivot Chair/bed transfer assist level: Supervision or verbal cues Chair/bed transfer assistive device: Environmental consultant, Armrests  Chair/bed transfer details: Verbal cues for precautions/safety, Verbal cues for technique  Function - Locomotion: Wheelchair Will patient use wheelchair at discharge?: Yes Type: Manual Max wheelchair distance: 150' Assist Level: Supervision or verbal cues Assist Level: Supervision or verbal cues Assist Level: Supervision or verbal cues Turns around,maneuvers to table,bed, and toilet,negotiates 3% grade,maneuvers on rugs and over doorsills: No Function - Locomotion: Ambulation Assistive device: Walker-rolling Max distance: 150 ft Assist level: Supervision or verbal cues Assist level: Supervision or verbal cues Walk 50 feet with 2 turns activity did not occur: Safety/medical concerns Assist level: Supervision or verbal cues Walk 150 feet activity did not occur: Safety/medical concerns Assist level: Supervision or verbal cues Walk 10 feet on uneven surfaces activity did not occur: Safety/medical concerns  Function - Comprehension Comprehension: Auditory Comprehension assist level: Follows basic conversation/direction with no assist  Function - Expression Expression: Verbal Expression assist level: Expresses basic needs/ideas: With no assist  Function - Social Interaction Social  Interaction assist level: Interacts appropriately 90% of the time - Needs monitoring or encouragement for participation or interaction.  Function - Problem Solving Problem solving assist level: Solves basic 90% of the time/requires cueing < 10% of the time  Function - Memory Memory assist level: Recognizes or recalls 90% of the time/requires cueing < 10% of the time Patient normally able to recall (first 3 days only): Current season, Location of own room, Staff names and faces, That he or she is in a hospital  Medical Problem List and Plan: 1. 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 DVT Prophylaxis/Anticoagulation: Eliquis 3. Pain Management: Tylenol as needed 4. Mood: Provide emotional support 5. Neuropsych: This patient iscapable of making decisions on hisown behalf. 6. Skin/Wound Care: Routine skin checks 7. Fluids/Electrolytes/Nutrition: Routine I&O's 8.ABLA.   Hb 10.9 on 9/3   9.Hypertension. Coreg 3.125 mg twice a day, Lasix 40 mg daily Vitals:   02/26/17 1346 02/27/17 0530  BP: 131/69 115/66  Pulse: 72 78  Resp: 18 19  Temp: 98 F (36.7 C) 98.2 F (36.8 C)  SpO2: 99% 100%   controlled on 9/5 10.CAD/CABG/AVR 2006. Follow-up cardiology services as needed. Continue anticoagulation 11.Type 2 diabetes mellitus. Hemoglobin A1c 6.4.SSI.Check blood sugars before meals and at bedtime CBG (last 3)    Recent Labs  02/26/17 1637 02/26/17 2110 02/27/17 0654  GLUCAP 109* 148* 103*   Resumed glimipride  controlled 9/5 12.Hyperlipidemia. Lipitor  13. Incidental finding meningioma follow-up with neurology as outpatient 14.  On IV abx for ?PNA vs intra abdominal infx, IV Unasyn completed on 8/31. 15.  Mental status changes-likely multifactorial, reviewed meds, has rectal CA  which may cause fatigue as will CVA, as will disruption in perfusion due to ICA stenosis.    UA unremarkable, Ucx No growth  CXR reviewed, overall improved  Repeat CT head reviewed, negative for acute changes.    Pt may have sleep apnea, may need OP f/u for this, defer to neuro on this LOS (Days) [place] 02/27/2017, 7:19 AM

## 2017-02-27 NOTE — Discharge Instructions (Signed)
Inpatient Rehab Discharge Instructions  Terry Wu Discharge date and time: No discharge date for patient encounter.   Activities/Precautions/ Functional Status: Activity: activity as tolerated Diet: soft Wound Care: keep wound clean and dry Functional status:  ___ No restrictions     ___ Walk up steps independently ___ 24/7 supervision/assistance   ___ Walk up steps with assistance ___ Intermittent supervision/assistance  ___ Bathe/dress independently ___ Walk with walker     _x__ Bathe/dress with assistance ___ Walk Independently    ___ Shower independently ___ Walk with assistance    ___ Shower with assistance ___ No alcohol     ___ Return to work/school ________  Special Instructions:  Routine colostomy care   COMMUNITY REFERRALS UPON DISCHARGE:    Home Health:   PT, OT, RN, [place]   Date of last service:02/27/2017   Medical Equipment/Items [place], [place], HOME O2  Agency/Supplier:[facility]   [phone]   GENERAL COMMUNITY RESOURCES FOR PATIENT/FAMILY: Support Groups:[facility] SUPPORT GROUP EVERY SECOND Thursday @ 3:00-4:00 PM ON THE REHAB UNIT QUESTIONS CONTACT [place] [phone]  My questions have been answered and I understand these instructions. I will adhere to these goals and the provided educational materials after my discharge from the hospital.  Patient/Caregiver Signature _______________________________ Date __________  Clinician Signature _______________________________________ Date __________  Please bring this form and your medication list with you to all your follow-up doctor's appointments.

## 2017-02-28 DIAGNOSIS — L89322 Pressure ulcer of left buttock, stage 2: Secondary | ICD-10-CM | POA: Diagnosis not present

## 2017-02-28 DIAGNOSIS — I5032 Chronic diastolic (congestive) heart failure: Secondary | ICD-10-CM | POA: Diagnosis not present

## 2017-02-28 DIAGNOSIS — J449 Chronic obstructive pulmonary disease, unspecified: Secondary | ICD-10-CM | POA: Diagnosis not present

## 2017-02-28 DIAGNOSIS — Z483 Aftercare following surgery for neoplasm: Secondary | ICD-10-CM | POA: Diagnosis not present

## 2017-02-28 DIAGNOSIS — E119 Type 2 diabetes mellitus without complications: Secondary | ICD-10-CM | POA: Diagnosis not present

## 2017-02-28 DIAGNOSIS — I482 Chronic atrial fibrillation: Secondary | ICD-10-CM | POA: Diagnosis not present

## 2017-02-28 DIAGNOSIS — I110 Hypertensive heart disease with heart failure: Secondary | ICD-10-CM | POA: Diagnosis not present

## 2017-02-28 DIAGNOSIS — I2510 Atherosclerotic heart disease of native coronary artery without angina pectoris: Secondary | ICD-10-CM | POA: Diagnosis not present

## 2017-02-28 DIAGNOSIS — Z8673 Personal history of transient ischemic attack (TIA), and cerebral infarction without residual deficits: Secondary | ICD-10-CM | POA: Diagnosis not present

## 2017-02-28 DIAGNOSIS — I6521 Occlusion and stenosis of right carotid artery: Secondary | ICD-10-CM | POA: Diagnosis not present

## 2017-02-28 DIAGNOSIS — Z9981 Dependence on supplemental oxygen: Secondary | ICD-10-CM | POA: Diagnosis not present

## 2017-02-28 DIAGNOSIS — Z433 Encounter for attention to colostomy: Secondary | ICD-10-CM | POA: Diagnosis not present

## 2017-02-28 DIAGNOSIS — C189 Malignant neoplasm of colon, unspecified: Secondary | ICD-10-CM | POA: Diagnosis not present

## 2017-03-01 DIAGNOSIS — Z483 Aftercare following surgery for neoplasm: Secondary | ICD-10-CM | POA: Diagnosis not present

## 2017-03-01 DIAGNOSIS — Z433 Encounter for attention to colostomy: Secondary | ICD-10-CM | POA: Diagnosis not present

## 2017-03-01 DIAGNOSIS — C189 Malignant neoplasm of colon, unspecified: Secondary | ICD-10-CM | POA: Diagnosis not present

## 2017-03-01 DIAGNOSIS — L89322 Pressure ulcer of left buttock, stage 2: Secondary | ICD-10-CM | POA: Diagnosis not present

## 2017-03-01 DIAGNOSIS — I2510 Atherosclerotic heart disease of native coronary artery without angina pectoris: Secondary | ICD-10-CM | POA: Diagnosis not present

## 2017-03-01 DIAGNOSIS — E119 Type 2 diabetes mellitus without complications: Secondary | ICD-10-CM | POA: Diagnosis not present

## 2017-03-04 ENCOUNTER — Telehealth: Payer: Self-pay | Admitting: Physical Medicine & Rehabilitation

## 2017-03-04 DIAGNOSIS — Z433 Encounter for attention to colostomy: Secondary | ICD-10-CM | POA: Diagnosis not present

## 2017-03-04 DIAGNOSIS — L89322 Pressure ulcer of left buttock, stage 2: Secondary | ICD-10-CM | POA: Diagnosis not present

## 2017-03-04 DIAGNOSIS — I2510 Atherosclerotic heart disease of native coronary artery without angina pectoris: Secondary | ICD-10-CM | POA: Diagnosis not present

## 2017-03-04 DIAGNOSIS — C189 Malignant neoplasm of colon, unspecified: Secondary | ICD-10-CM | POA: Diagnosis not present

## 2017-03-04 DIAGNOSIS — E119 Type 2 diabetes mellitus without complications: Secondary | ICD-10-CM | POA: Diagnosis not present

## 2017-03-04 DIAGNOSIS — Z483 Aftercare following surgery for neoplasm: Secondary | ICD-10-CM | POA: Diagnosis not present

## 2017-03-04 NOTE — Telephone Encounter (Signed)
Oxygen 2 L at night

## 2017-03-04 NOTE — Telephone Encounter (Signed)
Please advise 

## 2017-03-04 NOTE — Telephone Encounter (Signed)
Terry Wu with [facility] needs clarification on oxygen, needs to know how much patient is supposed to be on.  Please call her at [phone].

## 2017-03-05 DIAGNOSIS — C189 Malignant neoplasm of colon, unspecified: Secondary | ICD-10-CM | POA: Diagnosis not present

## 2017-03-05 DIAGNOSIS — Z483 Aftercare following surgery for neoplasm: Secondary | ICD-10-CM | POA: Diagnosis not present

## 2017-03-05 DIAGNOSIS — E119 Type 2 diabetes mellitus without complications: Secondary | ICD-10-CM | POA: Diagnosis not present

## 2017-03-05 DIAGNOSIS — I2510 Atherosclerotic heart disease of native coronary artery without angina pectoris: Secondary | ICD-10-CM | POA: Diagnosis not present

## 2017-03-05 DIAGNOSIS — Z433 Encounter for attention to colostomy: Secondary | ICD-10-CM | POA: Diagnosis not present

## 2017-03-05 DIAGNOSIS — L89322 Pressure ulcer of left buttock, stage 2: Secondary | ICD-10-CM | POA: Diagnosis not present

## 2017-03-05 NOTE — Telephone Encounter (Signed)
Annette notified.

## 2017-03-06 ENCOUNTER — Other Ambulatory Visit: Payer: Self-pay

## 2017-03-06 DIAGNOSIS — C189 Malignant neoplasm of colon, unspecified: Secondary | ICD-10-CM | POA: Diagnosis not present

## 2017-03-06 DIAGNOSIS — Z433 Encounter for attention to colostomy: Secondary | ICD-10-CM | POA: Diagnosis not present

## 2017-03-06 DIAGNOSIS — I2510 Atherosclerotic heart disease of native coronary artery without angina pectoris: Secondary | ICD-10-CM | POA: Diagnosis not present

## 2017-03-06 DIAGNOSIS — E119 Type 2 diabetes mellitus without complications: Secondary | ICD-10-CM | POA: Diagnosis not present

## 2017-03-06 DIAGNOSIS — L89322 Pressure ulcer of left buttock, stage 2: Secondary | ICD-10-CM | POA: Diagnosis not present

## 2017-03-06 DIAGNOSIS — Z483 Aftercare following surgery for neoplasm: Secondary | ICD-10-CM | POA: Diagnosis not present

## 2017-03-06 NOTE — Patient Outreach (Signed)
[facility]) Care Management  03/06/2017  Terry Wu 18-Nov-1931 [id number]   EMMI: Stroke Referral date: 03/06/17 Referral source: EMMI stroke RED alert Referral reason: Feeling worse overall: YES Day # 6  Telephone call to patient regarding EMMI stroke red alert. HIPAA verified with patient. Discussed EMMI stroke program with patient. Patient states he is not feeling worse. Patient states he is doing fine. Patient reports he has assistance from his wife with his care. Patient states he has all of his medications and is taking his medications as prescribed. Patient reports he has transportation to his appointments and has scheduled his follow up appointments with his doctors.   RNCM discussed and offered  [facility] care management services with patient. Patient refused services. Patient states he does not have any further needs. RNCM advised patient to notify MD of any changes in condition prior to scheduled appointment. RNCM offered to give patient contact phone number for [facility] care management and 24 hour advise line. Patient request information be sent to him.   RNCM verified patient aware of 911 services for urgent/ emergent needs.   ASSESSMENT;  Per patients MEDICAL RECORD NUMBER DATE OF ADMISSION:  02/14/2017 DATE OF DISCHARGE:  02/27/2017                    DISCHARGE DIAGNOSES: 1. Decreased functional mobility secondary to debilitation with     encephalopathy secondary to colon cancer, status post colectomy     with creation of descending colostomy, patchy acute small left MCA     and MCA territory infarcts. 2. Eliquis for DVT prophylaxis. 3. Acute blood loss anemia. 4. Hypertension. 5. Coronary artery disease, CABG with aortic valve replacement in     2006. 6. Type 2 diabetes mellitus. 7. Hyperlipidemia. 8. Incidental findings of meningioma.  Follow up outpatient neurology     services. 83 year old right-handed male with  history of aortic aneurysm, atrial  fibrillation, CAD with CABG, aortic valve replacement in 2006, maintained on Coumadin, diabetes mellitus, prostate cancer.  Lives with spouse, independent prior to Admission  PLAN: RNCM will refer patient to care management assistant to close due to refusal of services. RNCM will send patient [facility] care management outreach letter/ brochure. RNCM will notify patients primary MD of closure.   Quinn Plowman RN,BSN,CCM [facility] Telephonic  [phone]

## 2017-03-07 DIAGNOSIS — Z433 Encounter for attention to colostomy: Secondary | ICD-10-CM | POA: Diagnosis not present

## 2017-03-07 DIAGNOSIS — L89322 Pressure ulcer of left buttock, stage 2: Secondary | ICD-10-CM | POA: Diagnosis not present

## 2017-03-07 DIAGNOSIS — Z483 Aftercare following surgery for neoplasm: Secondary | ICD-10-CM | POA: Diagnosis not present

## 2017-03-07 DIAGNOSIS — E119 Type 2 diabetes mellitus without complications: Secondary | ICD-10-CM | POA: Diagnosis not present

## 2017-03-07 DIAGNOSIS — C189 Malignant neoplasm of colon, unspecified: Secondary | ICD-10-CM | POA: Diagnosis not present

## 2017-03-07 DIAGNOSIS — I2510 Atherosclerotic heart disease of native coronary artery without angina pectoris: Secondary | ICD-10-CM | POA: Diagnosis not present

## 2017-03-08 DIAGNOSIS — E119 Type 2 diabetes mellitus without complications: Secondary | ICD-10-CM | POA: Diagnosis not present

## 2017-03-08 DIAGNOSIS — C189 Malignant neoplasm of colon, unspecified: Secondary | ICD-10-CM | POA: Diagnosis not present

## 2017-03-08 DIAGNOSIS — Z433 Encounter for attention to colostomy: Secondary | ICD-10-CM | POA: Diagnosis not present

## 2017-03-08 DIAGNOSIS — Z483 Aftercare following surgery for neoplasm: Secondary | ICD-10-CM | POA: Diagnosis not present

## 2017-03-08 DIAGNOSIS — L89322 Pressure ulcer of left buttock, stage 2: Secondary | ICD-10-CM | POA: Diagnosis not present

## 2017-03-08 DIAGNOSIS — I2510 Atherosclerotic heart disease of native coronary artery without angina pectoris: Secondary | ICD-10-CM | POA: Diagnosis not present

## 2017-03-11 DIAGNOSIS — E119 Type 2 diabetes mellitus without complications: Secondary | ICD-10-CM | POA: Diagnosis not present

## 2017-03-11 DIAGNOSIS — C189 Malignant neoplasm of colon, unspecified: Secondary | ICD-10-CM | POA: Diagnosis not present

## 2017-03-11 DIAGNOSIS — Z433 Encounter for attention to colostomy: Secondary | ICD-10-CM | POA: Diagnosis not present

## 2017-03-11 DIAGNOSIS — L89322 Pressure ulcer of left buttock, stage 2: Secondary | ICD-10-CM | POA: Diagnosis not present

## 2017-03-11 DIAGNOSIS — Z483 Aftercare following surgery for neoplasm: Secondary | ICD-10-CM | POA: Diagnosis not present

## 2017-03-11 DIAGNOSIS — I2510 Atherosclerotic heart disease of native coronary artery without angina pectoris: Secondary | ICD-10-CM | POA: Diagnosis not present

## 2017-03-12 DIAGNOSIS — I482 Chronic atrial fibrillation: Secondary | ICD-10-CM | POA: Diagnosis not present

## 2017-03-12 DIAGNOSIS — I350 Nonrheumatic aortic (valve) stenosis: Secondary | ICD-10-CM | POA: Diagnosis not present

## 2017-03-12 DIAGNOSIS — Z433 Encounter for attention to colostomy: Secondary | ICD-10-CM | POA: Diagnosis not present

## 2017-03-12 DIAGNOSIS — L89322 Pressure ulcer of left buttock, stage 2: Secondary | ICD-10-CM | POA: Diagnosis not present

## 2017-03-12 DIAGNOSIS — I2510 Atherosclerotic heart disease of native coronary artery without angina pectoris: Secondary | ICD-10-CM | POA: Diagnosis not present

## 2017-03-12 DIAGNOSIS — J449 Chronic obstructive pulmonary disease, unspecified: Secondary | ICD-10-CM | POA: Diagnosis not present

## 2017-03-12 DIAGNOSIS — I10 Essential (primary) hypertension: Secondary | ICD-10-CM | POA: Diagnosis not present

## 2017-03-12 DIAGNOSIS — E119 Type 2 diabetes mellitus without complications: Secondary | ICD-10-CM | POA: Diagnosis not present

## 2017-03-12 DIAGNOSIS — Z483 Aftercare following surgery for neoplasm: Secondary | ICD-10-CM | POA: Diagnosis not present

## 2017-03-12 DIAGNOSIS — C189 Malignant neoplasm of colon, unspecified: Secondary | ICD-10-CM | POA: Diagnosis not present

## 2017-03-13 DIAGNOSIS — Z483 Aftercare following surgery for neoplasm: Secondary | ICD-10-CM | POA: Diagnosis not present

## 2017-03-13 DIAGNOSIS — E119 Type 2 diabetes mellitus without complications: Secondary | ICD-10-CM | POA: Diagnosis not present

## 2017-03-13 DIAGNOSIS — L89322 Pressure ulcer of left buttock, stage 2: Secondary | ICD-10-CM | POA: Diagnosis not present

## 2017-03-13 DIAGNOSIS — C189 Malignant neoplasm of colon, unspecified: Secondary | ICD-10-CM | POA: Diagnosis not present

## 2017-03-13 DIAGNOSIS — I2510 Atherosclerotic heart disease of native coronary artery without angina pectoris: Secondary | ICD-10-CM | POA: Diagnosis not present

## 2017-03-13 DIAGNOSIS — Z433 Encounter for attention to colostomy: Secondary | ICD-10-CM | POA: Diagnosis not present

## 2017-03-14 DIAGNOSIS — L89322 Pressure ulcer of left buttock, stage 2: Secondary | ICD-10-CM | POA: Diagnosis not present

## 2017-03-14 DIAGNOSIS — Z483 Aftercare following surgery for neoplasm: Secondary | ICD-10-CM | POA: Diagnosis not present

## 2017-03-14 DIAGNOSIS — E119 Type 2 diabetes mellitus without complications: Secondary | ICD-10-CM | POA: Diagnosis not present

## 2017-03-14 DIAGNOSIS — C189 Malignant neoplasm of colon, unspecified: Secondary | ICD-10-CM | POA: Diagnosis not present

## 2017-03-14 DIAGNOSIS — I2510 Atherosclerotic heart disease of native coronary artery without angina pectoris: Secondary | ICD-10-CM | POA: Diagnosis not present

## 2017-03-14 DIAGNOSIS — Z433 Encounter for attention to colostomy: Secondary | ICD-10-CM | POA: Diagnosis not present

## 2017-03-15 DIAGNOSIS — I2510 Atherosclerotic heart disease of native coronary artery without angina pectoris: Secondary | ICD-10-CM | POA: Diagnosis not present

## 2017-03-15 DIAGNOSIS — E119 Type 2 diabetes mellitus without complications: Secondary | ICD-10-CM | POA: Diagnosis not present

## 2017-03-15 DIAGNOSIS — L89322 Pressure ulcer of left buttock, stage 2: Secondary | ICD-10-CM | POA: Diagnosis not present

## 2017-03-15 DIAGNOSIS — Z483 Aftercare following surgery for neoplasm: Secondary | ICD-10-CM | POA: Diagnosis not present

## 2017-03-15 DIAGNOSIS — C189 Malignant neoplasm of colon, unspecified: Secondary | ICD-10-CM | POA: Diagnosis not present

## 2017-03-15 DIAGNOSIS — Z433 Encounter for attention to colostomy: Secondary | ICD-10-CM | POA: Diagnosis not present

## 2017-03-19 DIAGNOSIS — I2510 Atherosclerotic heart disease of native coronary artery without angina pectoris: Secondary | ICD-10-CM | POA: Diagnosis not present

## 2017-03-19 DIAGNOSIS — C189 Malignant neoplasm of colon, unspecified: Secondary | ICD-10-CM | POA: Diagnosis not present

## 2017-03-19 DIAGNOSIS — Z433 Encounter for attention to colostomy: Secondary | ICD-10-CM | POA: Diagnosis not present

## 2017-03-19 DIAGNOSIS — E119 Type 2 diabetes mellitus without complications: Secondary | ICD-10-CM | POA: Diagnosis not present

## 2017-03-19 DIAGNOSIS — Z483 Aftercare following surgery for neoplasm: Secondary | ICD-10-CM | POA: Diagnosis not present

## 2017-03-19 DIAGNOSIS — L89322 Pressure ulcer of left buttock, stage 2: Secondary | ICD-10-CM | POA: Diagnosis not present

## 2017-03-20 DIAGNOSIS — Z483 Aftercare following surgery for neoplasm: Secondary | ICD-10-CM | POA: Diagnosis not present

## 2017-03-20 DIAGNOSIS — E119 Type 2 diabetes mellitus without complications: Secondary | ICD-10-CM | POA: Diagnosis not present

## 2017-03-20 DIAGNOSIS — L89322 Pressure ulcer of left buttock, stage 2: Secondary | ICD-10-CM | POA: Diagnosis not present

## 2017-03-20 DIAGNOSIS — I2510 Atherosclerotic heart disease of native coronary artery without angina pectoris: Secondary | ICD-10-CM | POA: Diagnosis not present

## 2017-03-20 DIAGNOSIS — Z433 Encounter for attention to colostomy: Secondary | ICD-10-CM | POA: Diagnosis not present

## 2017-03-20 DIAGNOSIS — C189 Malignant neoplasm of colon, unspecified: Secondary | ICD-10-CM | POA: Diagnosis not present

## 2017-03-27 DIAGNOSIS — L89322 Pressure ulcer of left buttock, stage 2: Secondary | ICD-10-CM | POA: Diagnosis not present

## 2017-03-27 DIAGNOSIS — C189 Malignant neoplasm of colon, unspecified: Secondary | ICD-10-CM | POA: Diagnosis not present

## 2017-03-27 DIAGNOSIS — I2510 Atherosclerotic heart disease of native coronary artery without angina pectoris: Secondary | ICD-10-CM | POA: Diagnosis not present

## 2017-03-27 DIAGNOSIS — Z433 Encounter for attention to colostomy: Secondary | ICD-10-CM | POA: Diagnosis not present

## 2017-03-27 DIAGNOSIS — Z483 Aftercare following surgery for neoplasm: Secondary | ICD-10-CM | POA: Diagnosis not present

## 2017-03-27 DIAGNOSIS — E119 Type 2 diabetes mellitus without complications: Secondary | ICD-10-CM | POA: Diagnosis not present

## 2017-04-16 DIAGNOSIS — Z433 Encounter for attention to colostomy: Secondary | ICD-10-CM | POA: Diagnosis not present

## 2017-04-16 DIAGNOSIS — E119 Type 2 diabetes mellitus without complications: Secondary | ICD-10-CM | POA: Diagnosis not present

## 2017-04-16 DIAGNOSIS — C189 Malignant neoplasm of colon, unspecified: Secondary | ICD-10-CM | POA: Diagnosis not present

## 2017-04-16 DIAGNOSIS — J449 Chronic obstructive pulmonary disease, unspecified: Secondary | ICD-10-CM | POA: Diagnosis not present

## 2017-04-16 DIAGNOSIS — Z483 Aftercare following surgery for neoplasm: Secondary | ICD-10-CM | POA: Diagnosis not present

## 2017-04-16 DIAGNOSIS — I482 Chronic atrial fibrillation: Secondary | ICD-10-CM | POA: Diagnosis not present

## 2017-04-16 DIAGNOSIS — L89322 Pressure ulcer of left buttock, stage 2: Secondary | ICD-10-CM | POA: Diagnosis not present

## 2017-04-16 DIAGNOSIS — Z23 Encounter for immunization: Secondary | ICD-10-CM | POA: Diagnosis not present

## 2017-04-16 DIAGNOSIS — I2510 Atherosclerotic heart disease of native coronary artery without angina pectoris: Secondary | ICD-10-CM | POA: Diagnosis not present

## 2017-04-17 ENCOUNTER — Encounter: Payer: Self-pay | Admitting: Neurology

## 2017-04-17 ENCOUNTER — Ambulatory Visit (INDEPENDENT_AMBULATORY_CARE_PROVIDER_SITE_OTHER): Payer: Medicare Other | Admitting: Neurology

## 2017-04-17 ENCOUNTER — Encounter (INDEPENDENT_AMBULATORY_CARE_PROVIDER_SITE_OTHER): Payer: Self-pay

## 2017-04-17 VITALS — BP 128/74 | HR 80 | Ht 69.0 in | Wt 224.1 lb

## 2017-04-17 DIAGNOSIS — I63412 Cerebral infarction due to embolism of left middle cerebral artery: Secondary | ICD-10-CM | POA: Diagnosis not present

## 2017-04-17 DIAGNOSIS — I6521 Occlusion and stenosis of right carotid artery: Secondary | ICD-10-CM | POA: Diagnosis not present

## 2017-04-17 NOTE — Patient Instructions (Signed)
I had a long d/w patient about his recent embolic stroke,atrial fibrillation, risk for recurrent stroke/TIAs, personally independently reviewed imaging studies and stroke evaluation results and answered questions.Continue Eliquis (apixaban) daily  for secondary stroke prevention and maintain strict control of hypertension with blood pressure goal below 130/90, diabetes with hemoglobin A1c goal below 6.5% and lipids with LDL cholesterol goal below 70 mg/dL. I also advised the patient to eat a healthy diet with plenty of whole grains, cereals, fruits and vegetables, exercise regularly and maintain ideal body weight ibuprofen encouraged him to use a walker at all times when ambulating. He will return for follow-up in 6 months with my nurse practitioner or call earlier if necessary. Stroke Prevention Some medical conditions and behaviors are associated with an increased chance of having a stroke. You may prevent a stroke by making healthy choices and managing medical conditions. How can I reduce my risk of having a stroke?  Stay physically active. Get at least 30 minutes of activity on most or all days.  Do not smoke. It may also be helpful to avoid exposure to secondhand smoke.  Limit alcohol use. Moderate alcohol use is considered to be: ? No more than 2 drinks per day for men. ? No more than 1 drink per day for nonpregnant women.  Eat healthy foods. This involves: ? Eating 5 or more servings of fruits and vegetables a day. ? Making dietary changes that address high blood pressure (hypertension), high cholesterol, diabetes, or obesity.  Manage your cholesterol levels. ? Making food choices that are high in fiber and low in saturated fat, trans fat, and cholesterol may control cholesterol levels. ? Take any prescribed medicines to control cholesterol as directed by your health care provider.  Manage your diabetes. ? Controlling your carbohydrate and sugar intake is recommended to manage  diabetes. ? Take any prescribed medicines to control diabetes as directed by your health care provider.  Control your hypertension. ? Making food choices that are low in salt (sodium), saturated fat, trans fat, and cholesterol is recommended to manage hypertension. ? Ask your health care provider if you need treatment to lower your blood pressure. Take any prescribed medicines to control hypertension as directed by your health care provider. ? If you are 13-8 years of age, have your blood pressure checked every 3-5 years. If you are 2 years of age or older, have your blood pressure checked every year.  Maintain a healthy weight. ? Reducing calorie intake and making food choices that are low in sodium, saturated fat, trans fat, and cholesterol are recommended to manage weight.  Stop drug abuse.  Avoid taking birth control pills. ? Talk to your health care provider about the risks of taking birth control pills if you are over 59 years old, smoke, get migraines, or have ever had a blood clot.  Get evaluated for sleep disorders (sleep apnea). ? Talk to your health care provider about getting a sleep evaluation if you snore a lot or have excessive sleepiness.  Take medicines only as directed by your health care provider. ? For some people, aspirin or blood thinners (anticoagulants) are helpful in reducing the risk of forming abnormal blood clots that can lead to stroke. If you have the irregular heart rhythm of atrial fibrillation, you should be on a blood thinner unless there is a good reason you cannot take them. ? Understand all your medicine instructions.  Make sure that other conditions (such as anemia or atherosclerosis) are addressed. Get help right  away if:  You have sudden weakness or numbness of the face, arm, or leg, especially on one side of the body.  Your face or eyelid droops to one side.  You have sudden confusion.  You have trouble speaking (aphasia) or  understanding.  You have sudden trouble seeing in one or both eyes.  You have sudden trouble walking.  You have dizziness.  You have a loss of balance or coordination.  You have a sudden, severe headache with no known cause.  You have new chest pain or an irregular heartbeat. Any of these symptoms may represent a serious problem that is an emergency. Do not wait to see if the symptoms will go away. Get medical help at once. Call your local emergency services (911 in [place] Do not drive yourself to the hospital. This information is not intended to replace advice given to you by your health care provider. Make sure you discuss any questions you have with your health care provider. Document Released: 07/19/2004 Document Revised: 11/17/2015 Document Reviewed: 12/12/2012 Elsevier Interactive Patient Education  2017 [employer].

## 2017-04-17 NOTE — Progress Notes (Signed)
[facility] [address] [place]. [place] [address] [phone]       OFFICE FOLLOW-UP NOTE  [name] Date of Birth:  [birth year] Medical Record Number:  [id number]   HPI: Terry Wu is a 83 year old Caucasian male seen today for first office follow-up visit following hospital consultation for stroke in August 2018.I have personally reviewed imaging films and electronic medical records.Terry Wu an 83 y.o.malewith a past medical history that is significant for hypertension, type two diabetes, CAD, hyperlipidemia, atrial fibrillation, prostate cancer and recent diagnosis and resection of rectosigmoid adenocarcinoma. He was transferred to [facility] from [facility] on 02/06/17 for removal of a large fungating and ulcerated nonobstructive colonic mass that was found on colonoscopy performed due to intractable diarrhea. Biopsy confirmed adenocarcinoma. Terry Wu underwent sigmoid colectomy with colostomy and hernia repair on the morning of 02/07/17. Following surgery, he was transferred from the postanesthesia care unit on peripheral vasopressor infusion for hypotension as well as precedex for sedation. Postoperatively, Terry Wu was in acute respiratory failure, but the PCCM note immediately preceding extubation stated that Terry Wu was awake, alert and in no distress. He was successfully extubated on the morning of 02/08/17 Neurology was consulted due to verbal reports of encephalopathy following extubation.  His wife and daughter were at bedside. His wife states that he is a naturally quiet and private man who was rather quiet before the surgery because the past few days have been a shock for him. She is surprised and pleased with his condition following surgery. She said, "Terry Wu old folks don't do drugs so well" and thought he would be far less alert. She believes that his current state is because the sedating medications he was on over the past couple of days "have done a number"  on him.She states that he has no known personal history of stroke or seizure, but that his father had a stroke.initially the patient was felt to be encephalopathic from the surgery however MRI scan of the brain showed patchy acute left MCA territory infarcts in multiple old cerebellar infarcts. MRA of neck showed high-grade stenosis of the right internal carotid artery which was felt to be a symptomatic this was subsequently confirmed on carotid ultrasound which showed acute 99% ICA stenosis. transthoracic echo showed up normal ejection fraction and bioprosthetic aortic valve without definite cardiac or embolism.he will A1c was 6.4. Patient had been on Coumadin for A. Fib prior to admission. He was switched to eliquis because patient was likely going to have multiple procedures and it would be more convenient and warfarin good be more of a hassle. Patient states his done well since discharge. Is starting eliquis much better without bleeding or bruising. His speech is back to normal. He has no trouble finding words or completing sentences. Uses a walker now mostly in an even though paralysis of his had no Wu. His blood pressure well controlled today it is [id number]. His blood sugars are all under good control and in women A1c checked yesterday was 5.7. He was on Lipitor which is distorted well without muscle aches or pains. He has finished in-house physical and occupational therapy. He has been eating healthy and has lost 20 pounds.e was supposed to follow-up with Dr. Sherren Mocha Early as an outpatient for elective right carotid surgery but for unclear reasons this has not happened   ROS:   14 system review of systems is positive for  No complaints today and all systems negative  PMH:  Past Medical History:  Diagnosis Date  . Aortic aneurysm (HCC)    Ascending thoracic - 4.8 cm by CT 2017  . Aortic regurgitation    Bioprosthetic AVR 2006  . Atrial fibrillation with controlled ventricular response ([place])  08/22/2015   CHADS2VASC=5, on Coumadin  . CAD (coronary artery disease)    Multivessel status post CABG 2006  . Complication of anesthesia    demerol per wife  . Esophageal reflux   . Essential hypertension   . History of dizziness   . Mixed hyperlipidemia   . Stroke ([place])   . Type 2 diabetes mellitus ([place])     Social History:  Social History   Social History  . Marital status: Married    Spouse name: N/A  . Number of children: N/A  . Years of education: N/A   Occupational History  . Retired    Social History Main Topics  . Smoking status: Never Smoker  . Smokeless tobacco: Never Used  . Alcohol use No  . Drug use: No  . Sexual activity: Not Currently   Other Topics Concern  . Not on file   Social History Narrative  . No narrative on file    Medications:   Current Outpatient Prescriptions on File Prior to Visit  Medication Sig Dispense Refill  . apixaban (ELIQUIS) 5 MG TABS tablet Take 1 tablet (5 mg total) by mouth 2 (two) times daily. 60 tablet 0  . atorvastatin (LIPITOR) 20 MG tablet Take 1 tablet (20 mg total) by mouth daily. 90 tablet 3  . carvedilol (COREG) 3.125 MG tablet Take 1 tablet (3.125 mg total) by mouth 2 (two) times daily with a meal. 60 tablet 0  . dorzolamide-timolol (COSOPT) 22.3-6.8 MG/ML ophthalmic solution Place 1 drop into both eyes 2 (two) times daily.    . furosemide (LASIX) 40 MG tablet Take 1 tablet (40 mg total) by mouth 2 (two) times daily. 60 tablet 0  . glimepiride (AMARYL) 1 MG tablet Take 1 tablet (1 mg total) by mouth daily with breakfast. 30 tablet 1  . magnesium oxide (MAG-OX) 400 MG tablet Take 1 tablet (400 mg total) by mouth daily. 30 tablet 0  . Multiple Vitamins-Minerals (ICAPS PO) Take 2 tablets by mouth 3 (three) times daily after meals.     . pantoprazole (PROTONIX) 40 MG tablet Take 1 tablet (40 mg total) by mouth daily. 90 tablet 3  . polyethylene glycol (MIRALAX / GLYCOLAX) packet Take 17 g by mouth daily. 14 each 0    No current facility-administered medications on file prior to visit.     Allergies:   Allergies  Allergen Reactions  . Aspirin     Unknown, per pt  . Demerol     Unknown, per pt    Physical Exam General: well developed, well nourished elderly Caucasian male, seated, in no evident distress.. Conjunctival erythema bilaterally Head: head normocephalic and atraumatic.  Neck: supple with soft right carotid   bruit Cardiovascular: regular rate and rhythm, no murmurs Musculoskeletal: no deformity Skin:  no rash/petichiae Vascular:  Normal pulses all extremities Vitals:   04/17/17 1111  BP: 128/74  Pulse: 80   Neurologic Exam Mental Status: Awake and fully alert. Oriented to place and time. Recent and remote memory intact. Attention span, concentration and fund of knowledge appropriate. Mood and affect appropriate.  Cranial Nerves: Fundoscopic exam reveals sharp disc margins. Pupils equal, briskly reactive to light. Extraocular movements full without nystagmus. Visual fields full to confrontation. Hearing intact. Facial sensation intact. Face, tongue,  palate moves normally and symmetrically.  Motor: Normal bulk and tone. Normal strength in all tested extremity muscles. Sensory.: intact to touch ,pinprick .position and vibratory sensation.  Coordination: Rapid alternating movements normal in all extremities. Finger-to-nose and heel-to-shin performed accurately bilaterally. Gait and Station: Arises from chair without difficulty. Stance is broad-based and uses a walker.. Gait demonstrates normal stride length and balance . Not able to heel, toe and tandem walk without difficulty.  Reflexes: 1+ and symmetric. Toes downgoing.   NIHSS  0 Modified Rankin  2   ASSESSMENT: 83 year old Caucasian male with embolic left MCA branch infarcti n August 2018 from atrial fibrillation while he was off anticoagulation for surgery. Incidental asymptomatic right internal carotid high-grade stenosis.He is  doing well with improvement in his speech and language.    PLAN: I had a long d/w patient about his recent embolic stroke,atrial fibrillation, risk for recurrent stroke/TIAs, personally independently reviewed imaging studies and stroke evaluation results and answered questions.Continue Eliquis (apixaban) daily  for secondary stroke prevention and maintain strict control of hypertension with blood pressure goal below 130/90, diabetes with hemoglobin A1c goal below 6.5% and lipids with LDL cholesterol goal below 70 mg/dL. I also advised the patient to eat a healthy diet with plenty of whole grains, cereals, fruits and vegetables, exercise regularly and maintain ideal body weight ibuprofen encouraged him to use a walker at all times when ambulating.refer to vascular surgery for consideration for right carotid revascularization. He will return for follow-up in 6 months with my nurse practitioner or call earlier if necessary. Greater than 50% of time during this 25 minute visit was spent on counseling,explanation of diagnosis of atrial fibrillation and embolic stroke, planning of further management, discussion with patient and family and coordination of care Antony Contras, MD  [facility] [address] [place], [place] [address]  Phone [phone] Fax [phone] Note: This document was prepared with digital dictation and possible smart phrase technology. Any transcriptional errors that result from this process are unintentional

## 2017-04-24 DIAGNOSIS — I2510 Atherosclerotic heart disease of native coronary artery without angina pectoris: Secondary | ICD-10-CM | POA: Diagnosis not present

## 2017-04-24 DIAGNOSIS — Z433 Encounter for attention to colostomy: Secondary | ICD-10-CM | POA: Diagnosis not present

## 2017-04-24 DIAGNOSIS — C189 Malignant neoplasm of colon, unspecified: Secondary | ICD-10-CM | POA: Diagnosis not present

## 2017-04-24 DIAGNOSIS — Z483 Aftercare following surgery for neoplasm: Secondary | ICD-10-CM | POA: Diagnosis not present

## 2017-04-24 DIAGNOSIS — E119 Type 2 diabetes mellitus without complications: Secondary | ICD-10-CM | POA: Diagnosis not present

## 2017-04-24 DIAGNOSIS — L89322 Pressure ulcer of left buttock, stage 2: Secondary | ICD-10-CM | POA: Diagnosis not present

## 2017-04-25 ENCOUNTER — Other Ambulatory Visit: Payer: Self-pay

## 2017-04-25 DIAGNOSIS — I6523 Occlusion and stenosis of bilateral carotid arteries: Secondary | ICD-10-CM

## 2017-06-05 ENCOUNTER — Encounter: Payer: Medicare Other | Admitting: Vascular Surgery

## 2017-06-05 ENCOUNTER — Inpatient Hospital Stay (HOSPITAL_COMMUNITY): Admission: RE | Admit: 2017-06-05 | Payer: Medicare Other | Source: Ambulatory Visit

## 2017-07-30 ENCOUNTER — Ambulatory Visit (INDEPENDENT_AMBULATORY_CARE_PROVIDER_SITE_OTHER): Payer: Medicare Other | Admitting: Vascular Surgery

## 2017-07-30 ENCOUNTER — Encounter: Payer: Self-pay | Admitting: Vascular Surgery

## 2017-07-30 ENCOUNTER — Ambulatory Visit (HOSPITAL_COMMUNITY)
Admission: RE | Admit: 2017-07-30 | Discharge: 2017-07-30 | Disposition: A | Discharge status: Home / Self Care | Payer: Medicare Other | Source: Ambulatory Visit | Attending: Vascular Surgery | Admitting: Vascular Surgery

## 2017-07-30 VITALS — BP 139/83 | HR 77 | Resp 18 | Ht 69.0 in | Wt 233.0 lb

## 2017-07-30 DIAGNOSIS — I6523 Occlusion and stenosis of bilateral carotid arteries: Secondary | ICD-10-CM | POA: Diagnosis not present

## 2017-07-30 DIAGNOSIS — I6521 Occlusion and stenosis of right carotid artery: Secondary | ICD-10-CM | POA: Diagnosis not present

## 2017-07-30 NOTE — Progress Notes (Signed)
[facility] [place]  Patient name: Terry Wu MRN: [id number] DOB: 08/17/1931 Sex: male  REASON FOR CONSULT: Evaluation of asymptomatic right carotid stenosis  HPI: Terry Wu is a 84 y.o. male, who is here today for discussion of asymptomatic right carotid stenosis.  He had a very complicated hospitalization in August 2018.  He was found to have a large colon cancer and underwent resection with postoperative difficulties.  He had altered mental status and MRI showed left MCA distribution stroke.  Further workup revealed no evidence of left carotid stenosis but did have a high-grade right carotid stenosis asymptomatic.  He eventually recovered from his surgery and is here today for further discussion of his asymptomatic carotid disease.  He does have an extensive past history with prior otic valve replacement in 2006 and is on chronic anticoagulation.  This had been with Coumadin but is now with Eliquis.  He specifically denies any prior right brain events.  He is right-handed.  Past Medical History:  Diagnosis Date  . Anemia   . Aortic aneurysm (HCC)    Ascending thoracic - 4.8 cm by CT 2017  . Aortic regurgitation    Bioprosthetic AVR 2006  . Atrial fibrillation with controlled ventricular response ([place]) 08/22/2015   CHADS2VASC=5, on Coumadin  . CAD (coronary artery disease)    Multivessel status post CABG 2006  . CHF (congestive heart failure) ([place])   . Complication of anesthesia    demerol per wife  . COPD (chronic obstructive pulmonary disease) ([place])   . Esophageal reflux   . Essential hypertension   . History of dizziness   . Mixed hyperlipidemia   . Stroke ([place])   . Type 2 diabetes mellitus (HCC)     Family History  Problem Relation Age of Onset  . Heart Problems Mother   . Heart disease Mother   . Stroke Father   . Cancer Unknown     SOCIAL HISTORY: Social History   Socioeconomic History  . Marital  status: Married    Spouse name: Not on file  . Number of children: Not on file  . Years of education: Not on file  . Highest education level: Not on file  Social Needs  . Financial resource strain: Not on file  . Food insecurity - worry: Not on file  . Food insecurity - inability: Not on file  . Transportation needs - medical: Not on file  . Transportation needs - non-medical: Not on file  Occupational History  . Occupation: Retired  Tobacco Use  . Smoking status: Never Smoker  . Smokeless tobacco: Never Used  Substance and Sexual Activity  . Alcohol use: No  . Drug use: No  . Sexual activity: Not Currently  Other Topics Concern  . Not on file  Social History Narrative  . Not on file    Allergies  Allergen Reactions  . Aspirin     Unknown, per pt  . Demerol     Unknown, per pt    Current Outpatient Medications  Medication Sig Dispense Refill  . apixaban (ELIQUIS) 5 MG TABS tablet Take 1 tablet (5 mg total) by mouth 2 (two) times daily. 60 tablet 0  . atorvastatin (LIPITOR) 20 MG tablet Take 1 tablet (20 mg total) by mouth daily. 90 tablet 3  . carvedilol (COREG) 3.125 MG tablet Take 1 tablet (3.125 mg total) by mouth 2 (two) times daily with a meal. 60 tablet 0  . dorzolamide-timolol (COSOPT) 22.3-6.8  MG/ML ophthalmic solution Place 1 drop into both eyes 2 (two) times daily.    . furosemide (LASIX) 40 MG tablet Take 1 tablet (40 mg total) by mouth 2 (two) times daily. 60 tablet 0  . glimepiride (AMARYL) 1 MG tablet Take 1 tablet (1 mg total) by mouth daily with breakfast. 30 tablet 1  . magnesium oxide (MAG-OX) 400 MG tablet Take 1 tablet (400 mg total) by mouth daily. 30 tablet 0  . Multiple Vitamins-Minerals (ICAPS PO) Take 2 tablets by mouth 3 (three) times daily after meals.     . polyethylene glycol (MIRALAX / GLYCOLAX) packet Take 17 g by mouth daily. 14 each 0  . Potassium Chloride Crys ER (KLOR-CON M20 PO) Take by mouth.    . pantoprazole (PROTONIX) 40 MG tablet  Take 1 tablet (40 mg total) by mouth daily. 90 tablet 3   No current facility-administered medications for this visit.     REVIEW OF SYSTEMS:  [X]  denotes positive finding, [ ]  denotes negative finding Cardiac  Comments:  Chest pain or chest pressure:    Shortness of breath upon exertion:    Short of breath when lying flat:    Irregular heart rhythm: x       Vascular    Pain in calf, thigh, or hip brought on by ambulation:    Pain in feet at night that wakes you up from your sleep:     Blood clot in your veins:    Leg swelling:         Pulmonary    Oxygen at home:    Productive cough:     Wheezing:         Neurologic    Sudden weakness in arms or legs:     Sudden numbness in arms or legs:     Sudden onset of difficulty speaking or slurred speech:    Temporary loss of vision in one eye:     Problems with dizziness:         Gastrointestinal    Blood in stool:     Vomited blood:         Genitourinary    Burning when urinating:     Blood in urine:        Psychiatric    Major depression:         Hematologic    Bleeding problems:    Problems with blood clotting too easily:        Skin    Rashes or ulcers:        Constitutional    Fever or chills:      PHYSICAL EXAM: Vitals:   07/30/17 1142 07/30/17 1144  BP: (!) 146/74 139/83  Pulse: 77   Resp: 18   SpO2: 96%   Weight: 233 lb (105.7 kg)   Height: 5\' 9"  (1.753 m)     GENERAL: The patient is a well-nourished male, in no acute distress. The vital signs are documented above. CARDIOVASCULAR: Carotid arteries without bruits bilaterally.  2+ radial pulses bilaterally. PULMONARY: There is good air exchange  ABDOMEN: Soft and non-tender  MUSCULOSKELETAL: There are no major deformities or cyanosis. NEUROLOGIC: No focal weakness or paresthesias are detected. SKIN: There are no ulcers or rashes noted. PSYCHIATRIC: The patient has a normal affect.  DATA:  I reviewed his former studies from August 2018 and he did  undergo repeat carotid duplex in our office today.  This did confirm 80-99% internal carotid artery stenosis on the right.  No significant carotid stenosis on the left.  MEDICAL ISSUES: Had a very long discussion with the patient and his wife present.  Explained to predicted 5 %/year risk of stroke or transient deficit related to his high-grade asymptomatic disease.  Also explained potential complications with endarterectomy at stroke risk of 1-2% with a very low risk of cranial nerve injury and occasions.  Explained that he would require a temporary discontinuation of his Eliquis around the time of surgery.  He wishes to discuss this further with his family and Dr. Luan Pulling before making a decision.  I again [place] symptoms of carotid disease to him and they were noted notify should they wish to proceed with elective right carotid endarterectomy for reduction of stroke risk   Rosetta Posner, MD [facility] Tel [phone] Pager [phone]

## 2017-07-31 ENCOUNTER — Ambulatory Visit: Payer: Self-pay | Admitting: *Deleted

## 2017-07-31 DIAGNOSIS — I482 Chronic atrial fibrillation, unspecified: Secondary | ICD-10-CM

## 2017-07-31 LAB — VAS US CAROTID
LEFT ECA DIAS: -12 cm/s
LEFT VERTEBRAL DIAS: 28 cm/s
Left CCA dist dias: -25 cm/s
Left CCA dist sys: -82 cm/s
Left CCA prox dias: 8 cm/s
Left CCA prox sys: 60 cm/s
Left ICA prox dias: -15 cm/s
Left ICA prox sys: -70 cm/s
RCCADSYS: -112 cm/s
RIGHT CCA MID DIAS: 9 cm/s
RIGHT ECA DIAS: 0 cm/s
Right CCA prox dias: 0 cm/s
Right CCA prox sys: -53 cm/s

## 2017-10-01 ENCOUNTER — Telehealth: Payer: Self-pay

## 2017-10-01 NOTE — Telephone Encounter (Signed)
I called pt to offer him a sooner appt with Janett Billow, NP rather than his currently scheduled appt with Hoyle Sauer, NP. If pt calls back, please discuss this with him and schedule appropriately.

## 2017-10-16 ENCOUNTER — Ambulatory Visit: Payer: Medicare Other | Admitting: Nurse Practitioner

## 2017-11-29 ENCOUNTER — Encounter: Payer: Self-pay | Admitting: Adult Health

## 2017-12-03 IMAGING — CT CT ANGIO CHEST
2 of 7 series · 18 of 46 positions shown · IV contrast (OMNIPAQUE 350)
Comparison: CT 02/01/2014 and 08/05/2013.

CLINICAL DATA: Follow up aortic aneurysm.  History of hypertension.

EXAM:
CT ANGIOGRAPHY CHEST WITH CONTRAST
TECHNIQUE: Multidetector CT imaging of the chest was performed using the
standard protocol during bolus administration of intravenous
contrast. Multiplanar CT image reconstructions and MIPs were
obtained to evaluate the vascular anatomy.
CONTRAST:  100mL OMNIPAQUE IOHEXOL 350 MG/ML SOLN

[Series 4: aorta 3.0 i31f 2 · axial · 0.91mm/px · z∈[+851,+1145]mm · 15 of 106 slices shown]
[im 4/106  lung]
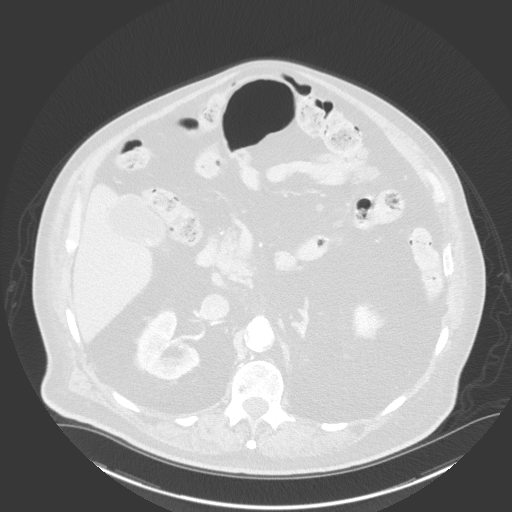
[im 12/106  soft-tissue]
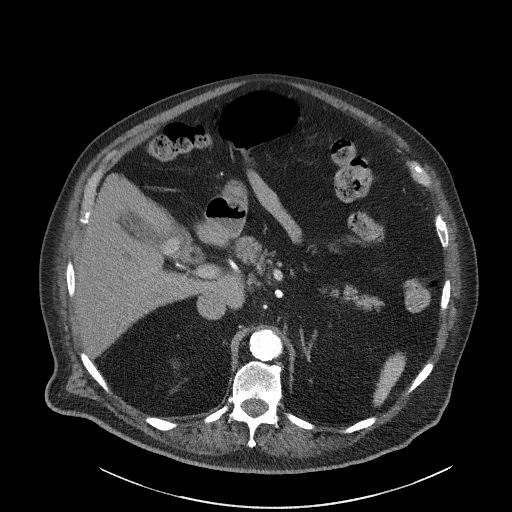
[im 20/106  lung]
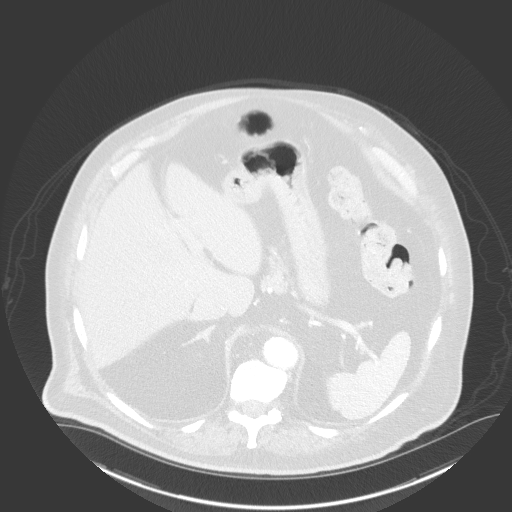
[im 28/106  soft-tissue]
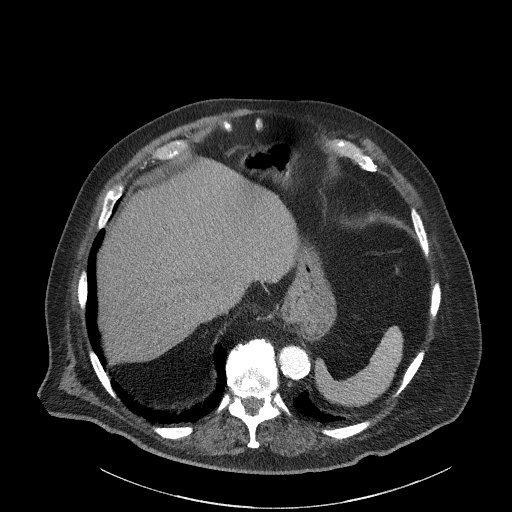
[im 32/106  lung]
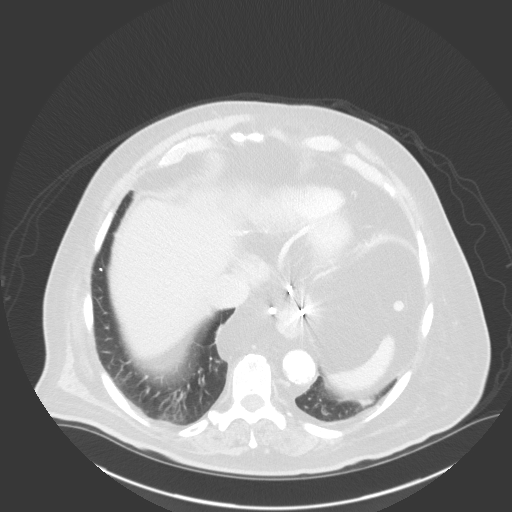
[im 39/106  soft-tissue]
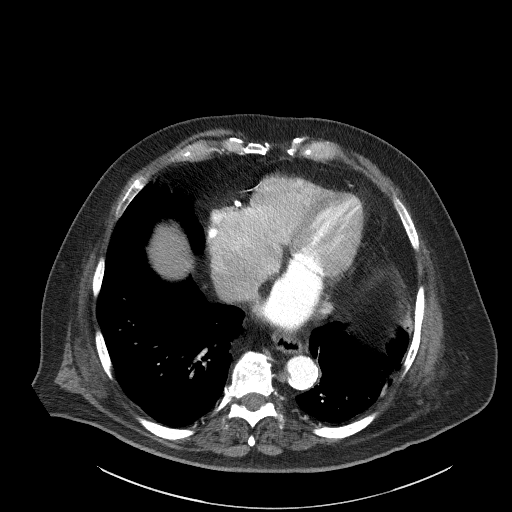
[im 47/106  lung]
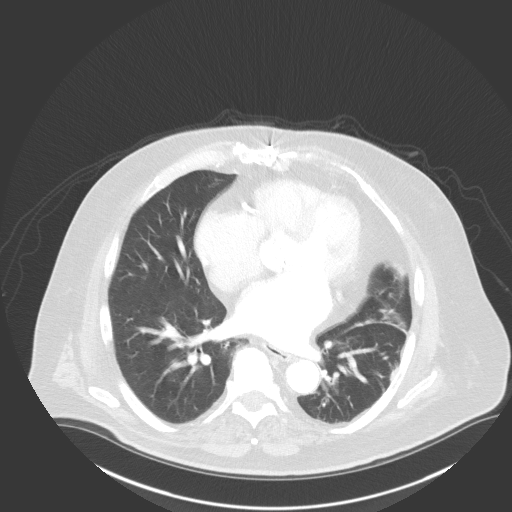
[im 55/106  soft-tissue]
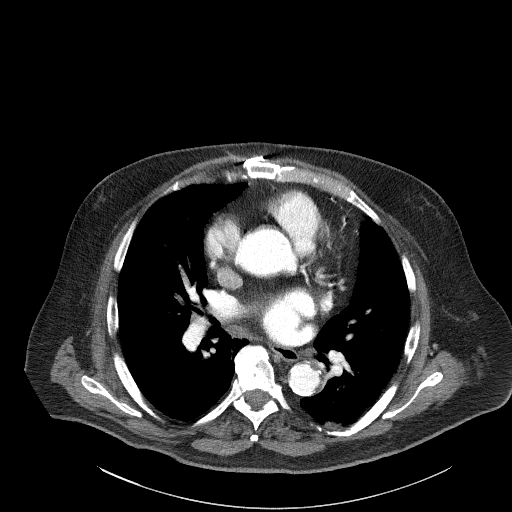
[im 59/106  lung]
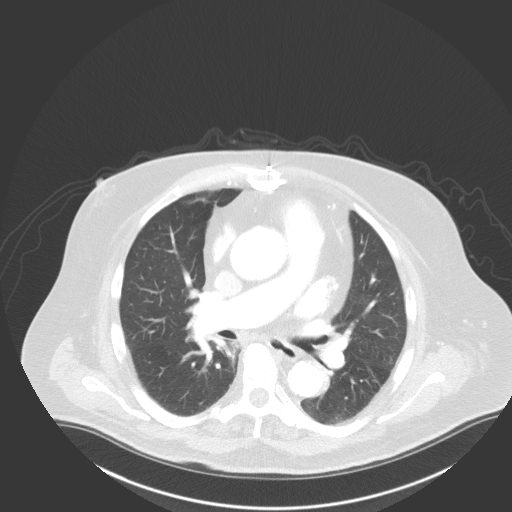
[im 67/106  soft-tissue]
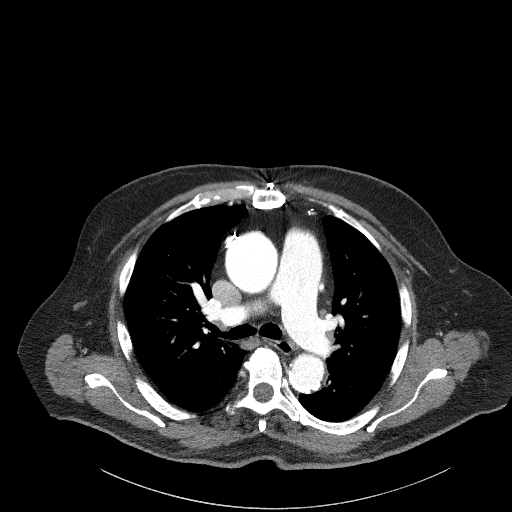
[im 74/106  lung]
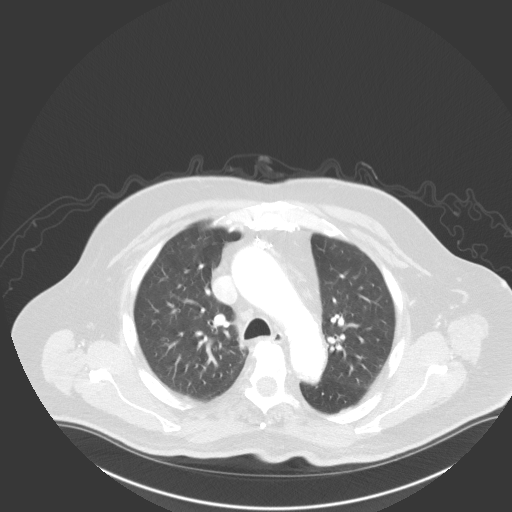
[im 78/106  soft-tissue]
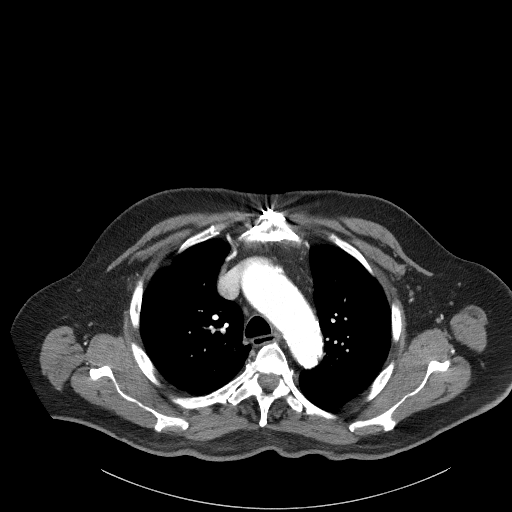
[im 86/106  lung]
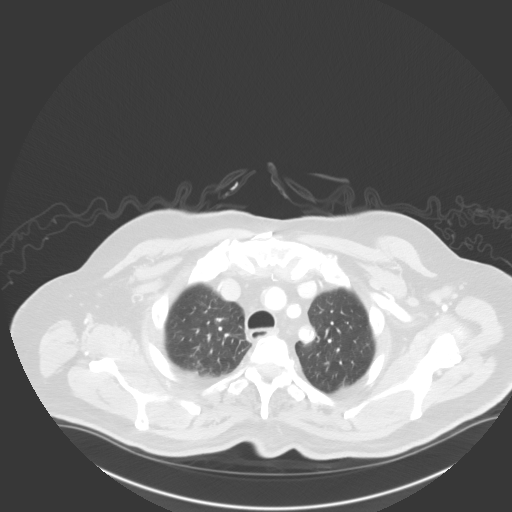
[im 94/106  soft-tissue]
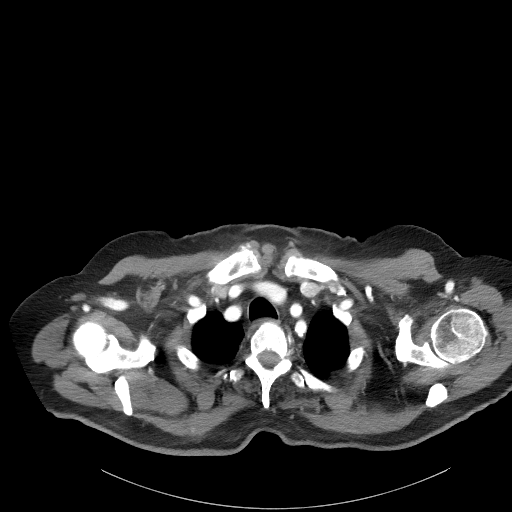
[im 102/106  lung]
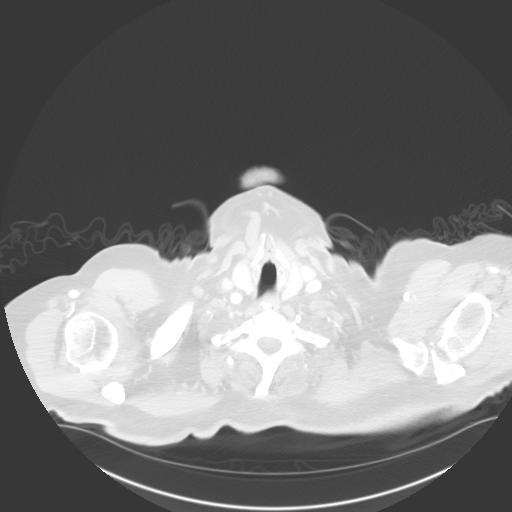

[Series 7: coronals · coronal · 0.75mm/px · 3 of 169 slices shown]
[im 43/169  soft-tissue]
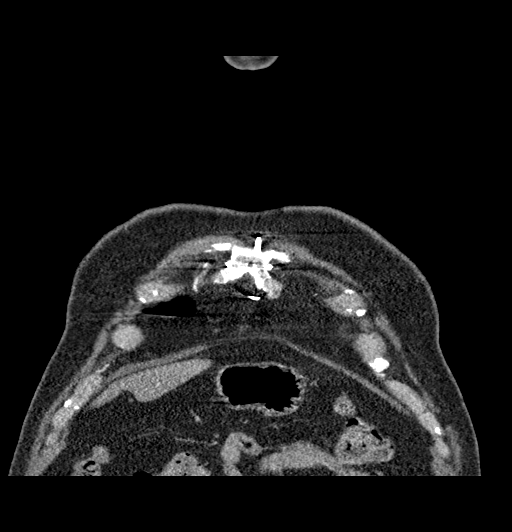
[im 85/169  soft-tissue]
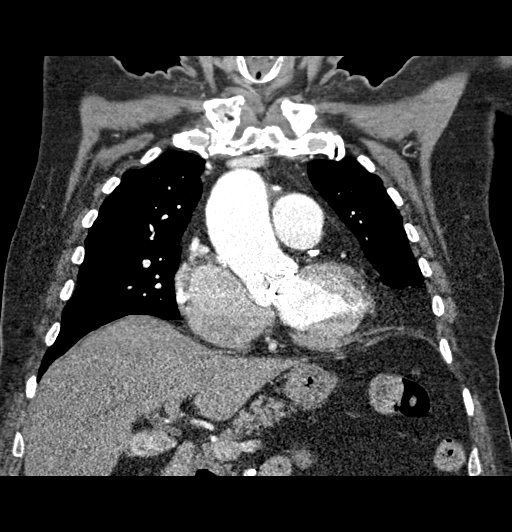
[im 127/169  soft-tissue]
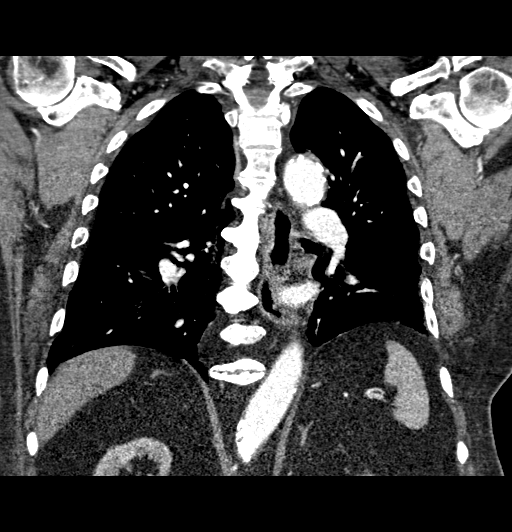

[18 of 46 positions shown; findings below may reference images not displayed]

FINDINGS: Mediastinum: There is diffuse atherosclerosis of the aorta, great
vessels and coronary arteries status post CABG. The maximal diameter
of the ascending aorta is stable at 4.8 cm. The aortic arch measures
up to 4.4 cm in transverse diameter. No evidence of aortic
dissection. The pulmonary arteries are well opacified with contrast.
There is no evidence of acute pulmonary embolism. There is no
mediastinal hematoma. There are no enlarged mediastinal, hilar or
axillary lymph nodes. The thyroid gland, trachea and esophagus
demonstrate no significant findings.

Lungs/Pleura: There is no pleural effusion.Mild emphysematous
changes, subpleural scarring and atelectasis are unchanged. There is
no suspicious pulmonary nodule.

Upper abdomen: There are postsurgical changes at the
gastroesophageal junction. Hepatic steatosis and multiple gallstones
are again noted. No evidence of adrenal mass.

Musculoskeletal/Chest wall: No chest wall lesion or acute osseous
findings.Stable rib deformities on the left.

Review of the MIP images confirms the above findings.
IMPRESSION: 1. Stable aneurysmal dilatation of the thoracic aorta, maximal
diameter 4.8 cm in the ascending aorta. No evidence of dissection.
Recommend semi-annual imaging followup by CTA or MRA and referral to
cardiothoracic surgery if not already obtained. This recommendation
follows 7464 ACCF/AHA/AATS/ACR/ASA/SCA/MERCY/GANTZ/PAUU/BTA Guidelines
for the Diagnosis and Management of Patients With Thoracic Aortic
Disease. Circulation. 7464; 121: [id number]
2. Diffuse atherosclerosis post CABG.
3. No acute chest findings.
4. Stable appearance of the upper abdomen with hepatic steatosis and
cholelithiasis.

## 2018-01-15 ENCOUNTER — Encounter: Payer: Self-pay | Admitting: Adult Health

## 2018-01-15 ENCOUNTER — Encounter

## 2018-01-15 ENCOUNTER — Ambulatory Visit (INDEPENDENT_AMBULATORY_CARE_PROVIDER_SITE_OTHER): Payer: Medicare Other | Admitting: Adult Health

## 2018-01-15 VITALS — BP 158/78 | HR 77 | Ht 69.0 in | Wt 251.6 lb

## 2018-01-15 DIAGNOSIS — E785 Hyperlipidemia, unspecified: Secondary | ICD-10-CM

## 2018-01-15 DIAGNOSIS — I63512 Cerebral infarction due to unspecified occlusion or stenosis of left middle cerebral artery: Secondary | ICD-10-CM

## 2018-01-15 DIAGNOSIS — I6523 Occlusion and stenosis of bilateral carotid arteries: Secondary | ICD-10-CM

## 2018-01-15 DIAGNOSIS — I481 Persistent atrial fibrillation: Secondary | ICD-10-CM | POA: Diagnosis not present

## 2018-01-15 DIAGNOSIS — I10 Essential (primary) hypertension: Secondary | ICD-10-CM

## 2018-01-15 DIAGNOSIS — E669 Obesity, unspecified: Secondary | ICD-10-CM

## 2018-01-15 DIAGNOSIS — E1169 Type 2 diabetes mellitus with other specified complication: Secondary | ICD-10-CM

## 2018-01-15 DIAGNOSIS — I4819 Other persistent atrial fibrillation: Secondary | ICD-10-CM

## 2018-01-15 DIAGNOSIS — E119 Type 2 diabetes mellitus without complications: Secondary | ICD-10-CM

## 2018-01-15 NOTE — Progress Notes (Signed)
[facility] [address] [place]. [place] [address] [phone]       OFFICE FOLLOW-UP NOTE  [name] Date of Birth:  01/13/1932 Medical Record Number:  [id number]   HPI: Terry Wu is a 84 year old Caucasian male seen today for first office follow-up visit following hospital consultation for stroke in August 2018.I have personally reviewed imaging films and electronic medical records.Terry Knappenberger Wilsonis an 84 y.o.malewith a past medical history that is significant for hypertension, type two diabetes, CAD, hyperlipidemia, atrial fibrillation, prostate cancer and recent diagnosis and resection of rectosigmoid adenocarcinoma. He was transferred to [facility] from [facility] on 02/06/17 for removal of a large fungating and ulcerated nonobstructive colonic mass that was found on colonoscopy performed due to intractable diarrhea. Biopsy confirmed adenocarcinoma. Terry Wu underwent sigmoid colectomy with colostomy and hernia repair on the morning of 02/07/17. Following surgery, he was transferred from the postanesthesia care unit on peripheral vasopressor infusion for hypotension as well as precedex for sedation. Postoperatively, Terry Wu was in acute respiratory failure, but the PCCM note immediately preceding extubation stated that Terry Wu was awake, alert and in no distress. He was successfully extubated on the morning of 02/08/17 Neurology was consulted due to verbal reports of encephalopathy following extubation.  His wife and daughter were at bedside. His wife states that he is a naturally quiet and private man who was rather quiet before the surgery because the past few days have been a shock for him. She is surprised and pleased with his condition following surgery. She said, "[name] don't do drugs so well" and thought he would be far less alert. She believes that his current state is because the sedating medications he was on over the past couple of days "have done a number"  on him.She states that he has no known personal history of stroke or seizure, but that his father had a stroke.initially the patient was felt to be encephalopathic from the surgery however MRI scan of the brain showed patchy acute left MCA territory infarcts in multiple old cerebellar infarcts. MRA of neck showed high-grade stenosis of the right internal carotid artery which was felt to be a symptomatic this was subsequently confirmed on carotid ultrasound which showed acute 99% ICA stenosis. transthoracic echo showed up normal ejection fraction and bioprosthetic aortic valve without definite cardiac or embolism.he will A1c was 6.4. Patient had been on Coumadin for A. Fib prior to admission. He was switched to eliquis because patient was likely going to have multiple procedures and it would be more convenient and warfarin good be more of a hassle.   04/17/18 visit PS: Patient states his done well since discharge. Is starting eliquis much better without bleeding or bruising. His speech is back to normal. He has no trouble finding words or completing sentences. Uses a walker now mostly in an even though paralysis of his had no falls. His blood pressure well controlled today it is 128/72. His blood sugars are all under good control and A1c checked yesterday was 5.7. He was on Lipitor which is tolerating well without muscle aches or pains. He has finished in-house physical and occupational therapy. He has been eating healthy and has lost 20 pounds.e was supposed to follow-up with Dr. Sherren Mocha Early as an outpatient for elective right carotid surgery but for unclear reasons this has not happened   01/15/18 UPDATE: Patient did have follow-up appointment on 07/30/2017 with Dr. Donnetta Hutching to discuss possible surgical procedure for carotid stenosis.  It  was determined to hold off at this time and continue with medical management  Due to stenosis being asymptomatic.   Patient returns today for follow-up visit and is accompanied  by his wife.  Overall he continues to do well.  He continues to take Eliquis without bleeding or bruising.  Continues to take Lipitor without side effects myalgias.  Blood pressure mildly elevated at 158/78 but patient does monitor this twice daily and typical SBP 1 20-1 30.  Patient will be following up with Dr. Luan Pulling PCP on August 1.  Denies new or worsening stroke/TIA symptoms.   ROS:   14 system review of systems is positive for  No complaints today and all systems negative  PMH:  Past Medical History:  Diagnosis Date  . Anemia   . Aortic aneurysm (HCC)    Ascending thoracic - 4.8 cm by CT 2017  . Aortic regurgitation    Bioprosthetic AVR 2006  . Atrial fibrillation with controlled ventricular response ([place]) 08/22/2015   CHADS2VASC=5, on Coumadin  . CAD (coronary artery disease)    Multivessel status post CABG 2006  . CHF (congestive heart failure) ([place])   . Complication of anesthesia    demerol per wife  . COPD (chronic obstructive pulmonary disease) ([place])   . Esophageal reflux   . Essential hypertension   . History of dizziness   . Mixed hyperlipidemia   . Stroke ([place])   . Type 2 diabetes mellitus ([place])     Social History:  Social History   Socioeconomic History  . Marital status: Married    Spouse name: Not on file  . Number of children: Not on file  . Years of education: Not on file  . Highest education level: Not on file  Occupational History  . Occupation: Retired  Scientific laboratory technician  . Financial resource strain: Not on file  . Food insecurity:    Worry: Not on file    Inability: Not on file  . Transportation needs:    Medical: Not on file    Non-medical: Not on file  Tobacco Use  . Smoking status: Never Smoker  . Smokeless tobacco: Never Used  Substance and Sexual Activity  . Alcohol use: No  . Drug use: No  . Sexual activity: Not Currently  Lifestyle  . Physical activity:    Days per week: Not on file    Minutes per session: Not on file  . Stress:  Not on file  Relationships  . Social connections:    Talks on phone: Not on file    Gets together: Not on file    Attends religious service: Not on file    Active member of club or organization: Not on file    Attends meetings of clubs or organizations: Not on file    Relationship status: Not on file  . Intimate partner violence:    Fear of current or ex partner: Not on file    Emotionally abused: Not on file    Physically abused: Not on file    Forced sexual activity: Not on file  Other Topics Concern  . Not on file  Social History Narrative  . Not on file    Medications:   Current Outpatient Medications on File Prior to Visit  Medication Sig Dispense Refill  . apixaban (ELIQUIS) 5 MG TABS tablet Take 1 tablet (5 mg total) by mouth 2 (two) times daily. 60 tablet 0  . atorvastatin (LIPITOR) 20 MG tablet Take 1 tablet (20 mg total)  by mouth daily. 90 tablet 3  . carvedilol (COREG) 3.125 MG tablet Take 1 tablet (3.125 mg total) by mouth 2 (two) times daily with a meal. 60 tablet 0  . furosemide (LASIX) 40 MG tablet Take 1 tablet (40 mg total) by mouth 2 (two) times daily. 60 tablet 0  . glimepiride (AMARYL) 1 MG tablet Take 1 tablet (1 mg total) by mouth daily with breakfast. 30 tablet 1  . magnesium oxide (MAG-OX) 400 MG tablet Take 1 tablet (400 mg total) by mouth daily. 30 tablet 0  . Multiple Vitamins-Minerals (ICAPS PO) Take 2 tablets by mouth 3 (three) times daily after meals.     . Multiple Vitamins-Minerals (PRESERVISION AREDS) CAPS Take by mouth.    . pantoprazole (PROTONIX) 40 MG tablet Take 1 tablet (40 mg total) by mouth daily. 90 tablet 3  . polyethylene glycol (MIRALAX / GLYCOLAX) packet Take 17 g by mouth daily. 14 each 0  . Potassium Chloride Crys ER (KLOR-CON M20 PO) Take 20 mg by mouth 2 (two) times daily.     . timolol (TIMOPTIC) 0.5 % ophthalmic solution INSTILL 1 DROP INTO EACH EYE TWICE DAILY  2   No current facility-administered medications on file prior to  visit.     Allergies:   Allergies  Allergen Reactions  . Aspirin     Unknown, per pt  . Demerol     Unknown, per pt    Physical Exam General: well developed, well nourished elderly Caucasian male, seated, in no evident distress.. Conjunctival erythema bilaterally Head: head normocephalic and atraumatic.  Neck: supple with soft right carotid   bruit Cardiovascular: regular rate and rhythm, no murmurs Musculoskeletal: no deformity Skin:  no rash/petichiae Vascular:  Normal pulses all extremities Vitals:   01/15/18 1435  BP: (!) 158/78  Pulse: 77   Neurologic Exam Mental Status: Awake and fully alert. Oriented to place and time. Recent and remote memory intact. Attention span, concentration and fund of knowledge appropriate. Mood and affect appropriate.  Cranial Nerves: Fundoscopic exam reveals sharp disc margins. Pupils equal, briskly reactive to light. Extraocular movements full without nystagmus. Visual fields full to confrontation. Hearing intact. Facial sensation intact. Face, tongue, palate moves normally and symmetrically.  Motor: Normal bulk and tone. Normal strength in all tested extremity muscles. Sensory.: intact to touch ,pinprick .position and vibratory sensation.  Coordination: Rapid alternating movements normal in all extremities. Finger-to-nose and heel-to-shin performed accurately bilaterally. Gait and Station: Arises from chair without difficulty. Stance is broad-based and uses a walker.. Gait demonstrates normal stride length and balance using rolling walker for assistance. Not able to heel, toe and tandem walk without difficulty.  Reflexes: 1+ and symmetric. Toes downgoing.      ASSESSMENT: 84 year old Caucasian male with embolic left MCA branch infarcti n August 2018 from atrial fibrillation while he was off anticoagulation for surgery. Incidental asymptomatic right internal carotid high-grade stenosis.He is doing well with improvement in his speech and  language.  Returns today for follow-up visit and overall doing well.    PLAN: -Continue Eliquis (apixaban) daily  and lipitor  for secondary stroke prevention -F/u with PCP regarding your HLD, DM and HTN management -continue to monitor BP at home -Advised to continue to stay active and eat a healthy diet -Maintain strict control of hypertension with blood pressure goal below 130/90, diabetes with hemoglobin A1c goal below 6.5% and cholesterol with LDL cholesterol (bad cholesterol) goal below 70 mg/dL. I also advised the patient to eat a healthy diet  with plenty of whole grains, cereals, fruits and vegetables, exercise regularly and maintain ideal body weight.  Follow up in 6 months or call earlier if needed  Greater than 50% of time during this 25 minute visit was spent on counseling,explanation of diagnosis of atrial fibrillation and embolic stroke, planning of further management, discussion with patient and family and coordination of care  Venancio Poisson, [facility]  [facility] [address] [place], [place] [address]  Phone [phone] Fax [phone] Note: This document was prepared with digital dictation and possible smart phrase technology. Any transcriptional errors that result from this process are unintentional.

## 2018-01-15 NOTE — Patient Instructions (Signed)
Continue Eliquis (apixaban) daily  and lipitor  for secondary stroke prevention  Continue to follow up with PCP regarding cholesterol and blood pressure management   Continue to stay active and eat healthy  Continue to monitor blood pressure at home  Maintain strict control of hypertension with blood pressure goal below 130/90, diabetes with hemoglobin A1c goal below 6.5% and cholesterol with LDL cholesterol (bad cholesterol) goal below 70 mg/dL. I also advised the patient to eat a healthy diet with plenty of whole grains, cereals, fruits and vegetables, exercise regularly and maintain ideal body weight.  Followup in the future with me in 6 months or call earlier if needed       Thank you for coming to see [place] at [facility]. I hope we have been able to provide you high quality care today.  You may receive a patient satisfaction survey over the next few weeks. We would appreciate your feedback and comments so that we may continue to improve ourselves and the health of our patients.

## 2018-01-16 NOTE — Progress Notes (Signed)
I agree with the above plan 

## 2018-03-26 ENCOUNTER — Emergency Department (HOSPITAL_COMMUNITY): Payer: Medicare Other

## 2018-03-26 ENCOUNTER — Inpatient Hospital Stay (HOSPITAL_COMMUNITY)
Admission: EM | Admit: 2018-03-26 | Discharge: 2018-04-25 | DRG: 296 | Disposition: E | Payer: Medicare Other | Attending: Internal Medicine | Admitting: Internal Medicine

## 2018-03-26 DIAGNOSIS — I340 Nonrheumatic mitral (valve) insufficiency: Secondary | ICD-10-CM | POA: Diagnosis not present

## 2018-03-26 DIAGNOSIS — R001 Bradycardia, unspecified: Secondary | ICD-10-CM | POA: Diagnosis not present

## 2018-03-26 DIAGNOSIS — Z79899 Other long term (current) drug therapy: Secondary | ICD-10-CM

## 2018-03-26 DIAGNOSIS — Z952 Presence of prosthetic heart valve: Secondary | ICD-10-CM | POA: Insufficient documentation

## 2018-03-26 DIAGNOSIS — Z85038 Personal history of other malignant neoplasm of large intestine: Secondary | ICD-10-CM

## 2018-03-26 DIAGNOSIS — J810 Acute pulmonary edema: Secondary | ICD-10-CM | POA: Diagnosis not present

## 2018-03-26 DIAGNOSIS — Z8546 Personal history of malignant neoplasm of prostate: Secondary | ICD-10-CM

## 2018-03-26 DIAGNOSIS — R55 Syncope and collapse: Secondary | ICD-10-CM | POA: Diagnosis not present

## 2018-03-26 DIAGNOSIS — Z7901 Long term (current) use of anticoagulants: Secondary | ICD-10-CM

## 2018-03-26 DIAGNOSIS — Z8679 Personal history of other diseases of the circulatory system: Secondary | ICD-10-CM

## 2018-03-26 DIAGNOSIS — I2510 Atherosclerotic heart disease of native coronary artery without angina pectoris: Secondary | ICD-10-CM | POA: Diagnosis present

## 2018-03-26 DIAGNOSIS — I959 Hypotension, unspecified: Secondary | ICD-10-CM | POA: Diagnosis present

## 2018-03-26 DIAGNOSIS — Z7189 Other specified counseling: Secondary | ICD-10-CM

## 2018-03-26 DIAGNOSIS — J9601 Acute respiratory failure with hypoxia: Secondary | ICD-10-CM | POA: Diagnosis not present

## 2018-03-26 DIAGNOSIS — I5033 Acute on chronic diastolic (congestive) heart failure: Secondary | ICD-10-CM | POA: Diagnosis present

## 2018-03-26 DIAGNOSIS — I469 Cardiac arrest, cause unspecified: Secondary | ICD-10-CM | POA: Diagnosis not present

## 2018-03-26 DIAGNOSIS — T4275XA Adverse effect of unspecified antiepileptic and sedative-hypnotic drugs, initial encounter: Secondary | ICD-10-CM | POA: Diagnosis present

## 2018-03-26 DIAGNOSIS — Z933 Colostomy status: Secondary | ICD-10-CM

## 2018-03-26 DIAGNOSIS — T8383XA Hemorrhage of genitourinary prosthetic devices, implants and grafts, initial encounter: Secondary | ICD-10-CM | POA: Diagnosis not present

## 2018-03-26 DIAGNOSIS — Z515 Encounter for palliative care: Secondary | ICD-10-CM | POA: Diagnosis not present

## 2018-03-26 DIAGNOSIS — Z885 Allergy status to narcotic agent status: Secondary | ICD-10-CM

## 2018-03-26 DIAGNOSIS — G9341 Metabolic encephalopathy: Secondary | ICD-10-CM | POA: Diagnosis not present

## 2018-03-26 DIAGNOSIS — R0600 Dyspnea, unspecified: Secondary | ICD-10-CM

## 2018-03-26 DIAGNOSIS — J440 Chronic obstructive pulmonary disease with acute lower respiratory infection: Secondary | ICD-10-CM | POA: Diagnosis not present

## 2018-03-26 DIAGNOSIS — R54 Age-related physical debility: Secondary | ICD-10-CM | POA: Diagnosis present

## 2018-03-26 DIAGNOSIS — R1310 Dysphagia, unspecified: Secondary | ICD-10-CM | POA: Diagnosis present

## 2018-03-26 DIAGNOSIS — I272 Pulmonary hypertension, unspecified: Secondary | ICD-10-CM | POA: Diagnosis present

## 2018-03-26 DIAGNOSIS — J15211 Pneumonia due to Methicillin susceptible Staphylococcus aureus: Secondary | ICD-10-CM | POA: Diagnosis not present

## 2018-03-26 DIAGNOSIS — Y846 Urinary catheterization as the cause of abnormal reaction of the patient, or of later complication, without mention of misadventure at the time of the procedure: Secondary | ICD-10-CM | POA: Diagnosis not present

## 2018-03-26 DIAGNOSIS — R627 Adult failure to thrive: Secondary | ICD-10-CM | POA: Diagnosis present

## 2018-03-26 DIAGNOSIS — T83038A Leakage of other indwelling urethral catheter, initial encounter: Secondary | ICD-10-CM | POA: Diagnosis not present

## 2018-03-26 DIAGNOSIS — E871 Hypo-osmolality and hyponatremia: Secondary | ICD-10-CM | POA: Diagnosis not present

## 2018-03-26 DIAGNOSIS — I110 Hypertensive heart disease with heart failure: Secondary | ICD-10-CM | POA: Diagnosis present

## 2018-03-26 DIAGNOSIS — Z886 Allergy status to analgesic agent status: Secondary | ICD-10-CM

## 2018-03-26 DIAGNOSIS — J9602 Acute respiratory failure with hypercapnia: Secondary | ICD-10-CM | POA: Diagnosis not present

## 2018-03-26 DIAGNOSIS — Z951 Presence of aortocoronary bypass graft: Secondary | ICD-10-CM

## 2018-03-26 DIAGNOSIS — Z66 Do not resuscitate: Secondary | ICD-10-CM | POA: Diagnosis not present

## 2018-03-26 DIAGNOSIS — I4891 Unspecified atrial fibrillation: Secondary | ICD-10-CM | POA: Diagnosis not present

## 2018-03-26 DIAGNOSIS — L89313 Pressure ulcer of right buttock, stage 3: Secondary | ICD-10-CM | POA: Diagnosis not present

## 2018-03-26 DIAGNOSIS — Z7984 Long term (current) use of oral hypoglycemic drugs: Secondary | ICD-10-CM

## 2018-03-26 DIAGNOSIS — J9621 Acute and chronic respiratory failure with hypoxia: Secondary | ICD-10-CM | POA: Diagnosis not present

## 2018-03-26 DIAGNOSIS — S3790XA Unspecified injury of unspecified urinary and pelvic organ, initial encounter: Secondary | ICD-10-CM | POA: Diagnosis not present

## 2018-03-26 DIAGNOSIS — R040 Epistaxis: Secondary | ICD-10-CM | POA: Diagnosis present

## 2018-03-26 DIAGNOSIS — E872 Acidosis: Secondary | ICD-10-CM | POA: Diagnosis not present

## 2018-03-26 DIAGNOSIS — Z8673 Personal history of transient ischemic attack (TIA), and cerebral infarction without residual deficits: Secondary | ICD-10-CM

## 2018-03-26 DIAGNOSIS — I5031 Acute diastolic (congestive) heart failure: Secondary | ICD-10-CM | POA: Diagnosis not present

## 2018-03-26 DIAGNOSIS — E785 Hyperlipidemia, unspecified: Secondary | ICD-10-CM | POA: Diagnosis present

## 2018-03-26 DIAGNOSIS — G931 Anoxic brain damage, not elsewhere classified: Secondary | ICD-10-CM | POA: Diagnosis not present

## 2018-03-26 DIAGNOSIS — K219 Gastro-esophageal reflux disease without esophagitis: Secondary | ICD-10-CM | POA: Diagnosis present

## 2018-03-26 DIAGNOSIS — J9690 Respiratory failure, unspecified, unspecified whether with hypoxia or hypercapnia: Secondary | ICD-10-CM

## 2018-03-26 DIAGNOSIS — E876 Hypokalemia: Secondary | ICD-10-CM | POA: Diagnosis not present

## 2018-03-26 DIAGNOSIS — E875 Hyperkalemia: Secondary | ICD-10-CM | POA: Diagnosis present

## 2018-03-26 DIAGNOSIS — I4819 Other persistent atrial fibrillation: Secondary | ICD-10-CM | POA: Diagnosis present

## 2018-03-26 DIAGNOSIS — Z953 Presence of xenogenic heart valve: Secondary | ICD-10-CM

## 2018-03-26 DIAGNOSIS — D649 Anemia, unspecified: Secondary | ICD-10-CM | POA: Diagnosis not present

## 2018-03-26 DIAGNOSIS — R319 Hematuria, unspecified: Secondary | ICD-10-CM | POA: Diagnosis not present

## 2018-03-26 DIAGNOSIS — L8990 Pressure ulcer of unspecified site, unspecified stage: Secondary | ICD-10-CM

## 2018-03-26 DIAGNOSIS — I482 Chronic atrial fibrillation, unspecified: Secondary | ICD-10-CM | POA: Diagnosis not present

## 2018-03-26 DIAGNOSIS — Z9049 Acquired absence of other specified parts of digestive tract: Secondary | ICD-10-CM

## 2018-03-26 DIAGNOSIS — E119 Type 2 diabetes mellitus without complications: Secondary | ICD-10-CM | POA: Diagnosis present

## 2018-03-26 HISTORY — DX: Essential (primary) hypertension: I10

## 2018-03-26 HISTORY — DX: Malignant (primary) neoplasm, unspecified: C80.1

## 2018-03-26 HISTORY — DX: Type 2 diabetes mellitus without complications: E11.9

## 2018-03-26 LAB — RENAL FUNCTION PANEL
ALBUMIN: 2.8 g/dL — AB (ref 3.5–5.0)
ANION GAP: 10 (ref 5–15)
BUN: 16 mg/dL (ref 8–23)
CALCIUM: 9 mg/dL (ref 8.9–10.3)
CO2: 31 mmol/L (ref 22–32)
CREATININE: 0.92 mg/dL (ref 0.61–1.24)
Chloride: 89 mmol/L — ABNORMAL LOW (ref 98–111)
GFR calc non Af Amer: >60 mL/min (ref >60)
GLUCOSE: 134 mg/dL — AB (ref 70–99)
PHOSPHORUS: 3.5 mg/dL (ref 2.5–4.6)
Potassium: 4.2 mmol/L (ref 3.5–5.1)
Sodium: 130 mmol/L — ABNORMAL LOW (ref 135–145)

## 2018-03-26 LAB — I-STAT ARTERIAL BLOOD GAS, ED
Acid-Base Excess: 6 mmol/L — ABNORMAL HIGH (ref 0.0–2.0)
BICARBONATE: 31.8 mmol/L — AB (ref 20.0–28.0)
O2 Saturation: 99 %
PH ART: 7.39 (ref 7.350–7.450)
PO2 ART: 172 mmHg — AB (ref 83.0–108.0)
TCO2: 33 mmol/L — ABNORMAL HIGH (ref 22–32)
pCO2 arterial: 52.5 mmHg — ABNORMAL HIGH (ref 32.0–48.0)

## 2018-03-26 LAB — URINALYSIS, ROUTINE W REFLEX MICROSCOPIC
Bilirubin Urine: NEGATIVE
GLUCOSE, UA: NEGATIVE mg/dL
Ketones, ur: NEGATIVE mg/dL
Leukocytes, UA: NEGATIVE
NITRITE: NEGATIVE
Protein, ur: 100 mg/dL — AB
RBC / HPF: >50 RBC/hpf — ABNORMAL HIGH (ref 0–5)
SPECIFIC GRAVITY, URINE: 1.013 (ref 1.005–1.030)
pH: 5 (ref 5.0–8.0)

## 2018-03-26 LAB — BLOOD GAS, ARTERIAL
ACID-BASE EXCESS: 6.4 mmol/L — AB (ref 0.0–2.0)
BICARBONATE: 31.2 mmol/L — AB (ref 20.0–28.0)
FIO2: 100
O2 Saturation: 99.7 %
PEEP: 5 cmH2O
PH ART: 7.41 (ref 7.350–7.450)
Patient temperature: 97.4
RATE: 16 resp/min
VT: 660 mL
pCO2 arterial: 49.7 mmHg — ABNORMAL HIGH (ref 32.0–48.0)
pO2, Arterial: 306 mmHg — ABNORMAL HIGH (ref 83.0–108.0)

## 2018-03-26 LAB — I-STAT CHEM 8, ED
BUN: 20 mg/dL (ref 8–23)
CALCIUM ION: 1.08 mmol/L — AB (ref 1.15–1.40)
CHLORIDE: 89 mmol/L — AB (ref 98–111)
Creatinine, Ser: 1.1 mg/dL (ref 0.61–1.24)
Glucose, Bld: 196 mg/dL — ABNORMAL HIGH (ref 70–99)
HCT: 33 % — ABNORMAL LOW (ref 39.0–52.0)
Hemoglobin: 11.2 g/dL — ABNORMAL LOW (ref 13.0–17.0)
Potassium: 5.9 mmol/L — ABNORMAL HIGH (ref 3.5–5.1)
Sodium: 124 mmol/L — ABNORMAL LOW (ref 135–145)
TCO2: 32 mmol/L (ref 22–32)

## 2018-03-26 LAB — CBC WITH DIFFERENTIAL/PLATELET
Abs Immature Granulocytes: 0.3 10*3/uL — ABNORMAL HIGH (ref 0.0–0.1)
Basophils Absolute: 0 10*3/uL (ref 0.0–0.1)
Basophils Relative: 0 %
EOS ABS: 0.1 10*3/uL (ref 0.0–0.7)
Eosinophils Relative: 1 %
HEMATOCRIT: 32.4 % — AB (ref 39.0–52.0)
Hemoglobin: 10.2 g/dL — ABNORMAL LOW (ref 13.0–17.0)
IMMATURE GRANULOCYTES: 3 %
Lymphocytes Relative: 18 %
Lymphs Abs: 2 10*3/uL (ref 0.7–4.0)
MCH: 29.6 pg (ref 26.0–34.0)
MCHC: 31.5 g/dL (ref 30.0–36.0)
MCV: 93.9 fL (ref 78.0–100.0)
Monocytes Absolute: 1.2 10*3/uL — ABNORMAL HIGH (ref 0.1–1.0)
Monocytes Relative: 11 %
NEUTROS PCT: 67 %
Neutro Abs: 7.6 10*3/uL (ref 1.7–7.7)
Platelets: 214 10*3/uL (ref 150–400)
RBC: 3.45 MIL/uL — AB (ref 4.22–5.81)
RDW: 14.1 % (ref 11.5–15.5)
WBC: 11.2 10*3/uL — AB (ref 4.0–10.5)

## 2018-03-26 LAB — COMPREHENSIVE METABOLIC PANEL
ALT: 50 U/L — ABNORMAL HIGH (ref 0–44)
ANION GAP: 10 (ref 5–15)
AST: 58 U/L — ABNORMAL HIGH (ref 15–41)
Albumin: 2.7 g/dL — ABNORMAL LOW (ref 3.5–5.0)
Alkaline Phosphatase: 37 U/L — ABNORMAL LOW (ref 38–126)
BILIRUBIN TOTAL: 0.9 mg/dL (ref 0.3–1.2)
BUN: 16 mg/dL (ref 8–23)
CHLORIDE: 89 mmol/L — AB (ref 98–111)
CO2: 27 mmol/L (ref 22–32)
Calcium: 8.6 mg/dL — ABNORMAL LOW (ref 8.9–10.3)
Creatinine, Ser: 1.11 mg/dL (ref 0.61–1.24)
GFR calc Af Amer: >60 mL/min (ref >60)
GFR calc non Af Amer: 59 mL/min — ABNORMAL LOW (ref >60)
Glucose, Bld: 196 mg/dL — ABNORMAL HIGH (ref 70–99)
POTASSIUM: 6.1 mmol/L — AB (ref 3.5–5.1)
SODIUM: 126 mmol/L — AB (ref 135–145)
TOTAL PROTEIN: 6.6 g/dL (ref 6.5–8.1)

## 2018-03-26 LAB — TYPE AND SCREEN
ABO/RH(D): O POS
Antibody Screen: NEGATIVE

## 2018-03-26 LAB — I-STAT CG4 LACTIC ACID, ED
LACTIC ACID, VENOUS: 1.81 mmol/L (ref 0.5–1.9)
Lactic Acid, Venous: 3.43 mmol/L (ref 0.5–1.9)

## 2018-03-26 LAB — BRAIN NATRIURETIC PEPTIDE: B Natriuretic Peptide: 432.6 pg/mL — ABNORMAL HIGH (ref 0.0–100.0)

## 2018-03-26 LAB — RAPID URINE DRUG SCREEN, HOSP PERFORMED
AMPHETAMINES: NOT DETECTED
Barbiturates: NOT DETECTED
Benzodiazepines: NOT DETECTED
Cocaine: NOT DETECTED
Opiates: NOT DETECTED
TETRAHYDROCANNABINOL: NOT DETECTED

## 2018-03-26 LAB — TROPONIN I
TROPONIN I: 0.1 ng/mL — AB (ref <0.03)
Troponin I: 0.03 ng/mL (ref <0.03)

## 2018-03-26 LAB — PROTIME-INR
INR: 2.01
Prothrombin Time: 22.6 seconds — ABNORMAL HIGH (ref 11.4–15.2)

## 2018-03-26 LAB — GLUCOSE, CAPILLARY: GLUCOSE-CAPILLARY: 123 mg/dL — AB (ref 70–99)

## 2018-03-26 LAB — ETHANOL

## 2018-03-26 LAB — MAGNESIUM: Magnesium: 2 mg/dL (ref 1.7–2.4)

## 2018-03-26 LAB — I-STAT TROPONIN, ED: Troponin i, poc: 0.05 ng/mL (ref 0.00–0.08)

## 2018-03-26 LAB — CBG MONITORING, ED: GLUCOSE-CAPILLARY: 150 mg/dL — AB (ref 70–99)

## 2018-03-26 LAB — ABO/RH: ABO/RH(D): O POS

## 2018-03-26 MED ORDER — ORAL CARE MOUTH RINSE
15.0000 mL | OROMUCOSAL | Status: DC
  Administered 2018-03-26 – 2018-03-28 (×15): 15 mL via OROMUCOSAL

## 2018-03-26 MED ORDER — PROPOFOL 1000 MG/100ML IV EMUL
INTRAVENOUS | Status: AC
  Filled 2018-03-26: qty 100

## 2018-03-26 MED ORDER — IPRATROPIUM-ALBUTEROL 0.5-2.5 (3) MG/3ML IN SOLN
3.0000 mL | Freq: Four times a day (QID) | RESPIRATORY_TRACT | Status: DC | PRN
  Administered 2018-03-29: 3 mL via RESPIRATORY_TRACT
  Filled 2018-03-26: qty 3

## 2018-03-26 MED ORDER — ETOMIDATE 2 MG/ML IV SOLN
INTRAVENOUS | Status: AC | PRN
  Administered 2018-03-26: 30 mg via INTRAVENOUS

## 2018-03-26 MED ORDER — DOCUSATE SODIUM 50 MG/5ML PO LIQD
100.0000 mg | Freq: Two times a day (BID) | ORAL | Status: DC | PRN
  Filled 2018-03-26: qty 10

## 2018-03-26 MED ORDER — INSULIN ASPART 100 UNIT/ML ~~LOC~~ SOLN
0.0000 [IU] | SUBCUTANEOUS | Status: DC
  Administered 2018-03-26 – 2018-03-29 (×3): 1 [IU] via SUBCUTANEOUS
  Administered 2018-03-29: 2 [IU] via SUBCUTANEOUS

## 2018-03-26 MED ORDER — PROPOFOL 1000 MG/100ML IV EMUL
5.0000 ug/kg/min | Freq: Once | INTRAVENOUS | Status: AC
  Administered 2018-03-26: 30.06 ug/kg/min via INTRAVENOUS

## 2018-03-26 MED ORDER — FUROSEMIDE 10 MG/ML IJ SOLN
40.0000 mg | Freq: Once | INTRAMUSCULAR | Status: AC
  Administered 2018-03-26: 40 mg via INTRAVENOUS
  Filled 2018-03-26: qty 4

## 2018-03-26 MED ORDER — CHLORHEXIDINE GLUCONATE 0.12% ORAL RINSE (MEDLINE KIT)
15.0000 mL | Freq: Two times a day (BID) | OROMUCOSAL | Status: DC
  Administered 2018-03-26: 15 mL via OROMUCOSAL

## 2018-03-26 MED ORDER — PANTOPRAZOLE SODIUM 40 MG PO PACK
40.0000 mg | PACK | Freq: Every day | ORAL | Status: DC
  Administered 2018-03-26 – 2018-03-28 (×3): 40 mg
  Filled 2018-03-26 (×3): qty 20

## 2018-03-26 MED ORDER — ROCURONIUM BROMIDE 50 MG/5ML IV SOLN
INTRAVENOUS | Status: AC | PRN
  Administered 2018-03-26: 100 mg via INTRAVENOUS

## 2018-03-26 MED ORDER — DOPAMINE-DEXTROSE 3.2-5 MG/ML-% IV SOLN
INTRAVENOUS | Status: AC
  Filled 2018-03-26: qty 250

## 2018-03-26 MED ORDER — PROPOFOL 1000 MG/100ML IV EMUL
5.0000 ug/kg/min | Freq: Once | INTRAVENOUS | Status: DC
  Administered 2018-03-26: 9 ug/kg/min via INTRAVENOUS

## 2018-03-26 MED ORDER — SODIUM CHLORIDE 0.9 % IV BOLUS
1000.0000 mL | Freq: Once | INTRAVENOUS | Status: AC
  Administered 2018-03-26: 1000 mL via INTRAVENOUS

## 2018-03-26 MED ORDER — DOPAMINE-DEXTROSE 3.2-5 MG/ML-% IV SOLN
0.0000 ug/kg/min | INTRAVENOUS | Status: DC
  Administered 2018-03-26: 5 ug/kg/min via INTRAVENOUS

## 2018-03-26 MED ORDER — BISACODYL 10 MG RE SUPP: 10.0000 mg | Freq: Every day | RECTAL | Status: DC | PRN

## 2018-03-26 MED ORDER — HEPARIN (PORCINE) IN NACL 100-0.45 UNIT/ML-% IJ SOLN
1100.0000 [IU]/h | INTRAMUSCULAR | Status: DC
  Administered 2018-03-26: 1400 [IU]/h via INTRAVENOUS
  Administered 2018-03-27: 1200 [IU]/h via INTRAVENOUS
  Administered 2018-03-28 – 2018-03-29 (×2): 1100 [IU]/h via INTRAVENOUS
  Filled 2018-03-26 (×4): qty 250

## 2018-03-26 NOTE — ED Triage Notes (Signed)
Patient arrived by [facility] following 5 minutes of CPR. Patient arrived with [facility] airway and right EJ by EMS. On arrival beginning to move all extremities and breathing over vent. Per EMS patient was witnessed arrest by family and approximately down 5 minutes prior to EMS arrival. After 5 minutes of CPR ROSC. No meds, no shock.

## 2018-03-26 NOTE — ED Notes (Signed)
CCM at bedside 

## 2018-03-26 NOTE — ED Notes (Signed)
Okey Regal, PA notified that pts lactic acid resulted at 3.43

## 2018-03-26 NOTE — Progress Notes (Signed)
ANTICOAGULATION CONSULT NOTE - Initial Consult  Pharmacy Consult for heparin Indication: atrial fibrillation  Allergies  Allergen Reactions  . Asa [Aspirin] Anaphylaxis and Swelling    Throat swells   . Demerol [Meperidine Hcl] Shortness Of Breath, Swelling and Other (See Comments)    "Suppresses vagus nerve" (occurred during a procedure)    Patient Measurements: Height: 6\' 2"  (188 cm) Weight: 220 lb (99.8 kg) IBW/kg (Calculated) : 82.2 Heparin Dosing Weight: 99kg  Vital Signs: Temp: 97.5 F (36.4 C) (10/02 2036) Temp Source: Temporal (10/02 1604) BP: 132/82 (10/02 2042) Pulse Rate: 84 (10/02 2036)  Labs: Recent Labs    03/27/2018 1610 04/18/2018 1707  HGB 10.2* 11.2*  HCT 32.4* 33.0*  PLT 214  --   LABPROT 22.6*  --   INR 2.01  --   CREATININE 1.11 1.10  TROPONINI 0.03*  --     Estimated Creatinine Clearance: 63.1 mL/min (by C-G formula based on SCr of 1.1 mg/dL).   Medical History: No past medical history on file.    Assessment: 21 yoM admitted s/p cardiac arrest. Pt on Eliquis PTA for AFib, to start on IV heparin while in ICU. Per med rec, last dose of Eliquis was PTA on 10/2 at 0500.   Goal of Therapy:  Heparin level 0.3-0.7 units/ml aPTT 66-102 seconds Monitor platelets by anticoagulation protocol: Yes   Plan:  -Start heparin 1400 units/hr -Check 8hr heparin level and aPTT  Arrie Senate, PharmD, BCPS Clinical Pharmacist [phone] Please check AMION for all [facility] numbers 04/21/2018

## 2018-03-26 NOTE — ED Notes (Signed)
Per Dr. Gilford Raid propofol d/c'd.

## 2018-03-26 NOTE — ED Provider Notes (Signed)
[facility] EMERGENCY DEPARTMENT Provider Note   CSN: [id number] Arrival date & time: 03/25/2018  1600     History   Chief Complaint Chief Complaint  Patient presents with  . post CPR    HPI Terry Wu is a 84 y.o. male.  HPI level 5 caveat due to patient's condition  84 year old male presents via EMS status post CPR.  EMS notes that they received a phone call for unconscious, they report wife noted patient was awake and alert, 2 minutes later was unresponsive, upon EMS arrival he was pulseless, they began CPR, and had return of pulses after approximately 5 minutes with no medications.  King airway was placed, patient with purposeful movements although not alert.    Patient's wife reports that she was talking to him he was eating watermelon, she notes that he became unresponsive slumped over in the chair.  She is uncertain if he had a pulse, she called EMS who noted he did not have a pulse upon arrival and notes that they immediately began CPR.  She notes that he has been in his usual state of health with no significant abnormalities.  She does note a significant cardiac history including A. fib, bypass in 2006 with aortic valve repair and a history of aortic aneurysm.  She reports he had no complaints of chest pain headache or any neurological complaints prior to event.    No past medical history on file.  There are no active problems to display for this patient.       Home Medications    Prior to Admission medications   Medication Sig Start Date End Date Taking? Authorizing Provider  amoxicillin (AMOXIL) 500 MG capsule Take 2,000 mg by mouth See admin instructions. Take 2,000 mg by mouth one hour prior to procedure(s), then as directed 01/18/18  Yes [provider]  atorvastatin (LIPITOR) 20 MG tablet Take 20 mg by mouth every evening.  01/04/18  Yes [provider]  carvedilol (COREG) 3.125 MG tablet Take 3.125 mg by mouth 2 (two)  times daily with a meal. 03/24/18  Yes [provider]  ELIQUIS 5 MG TABS tablet Take 5 mg by mouth 2 (two) times daily. 03/07/18  Yes [provider]  furosemide (LASIX) 40 MG tablet Take 40 mg by mouth 2 (two) times daily. 12/24/17  Yes [provider]  glimepiride (AMARYL) 2 MG tablet Take 1 mg by mouth daily with breakfast.   Yes [provider]  Multiple Vitamins-Minerals (PRESERVISION AREDS 2+MULTI VIT) CAPS Take 1 capsule by mouth 2 (two) times daily.   Yes [provider]  pantoprazole (PROTONIX) 40 MG tablet Take 40 mg by mouth daily before lunch.  02/12/18  Yes [provider]  potassium chloride (K-DUR,KLOR-CON) 10 MEQ tablet Take 10 mEq by mouth 2 (two) times daily.   Yes [provider]  timolol (TIMOPTIC) 0.5 % ophthalmic solution Place 1 drop into both eyes 2 (two) times daily. 03/07/18  Yes [provider]    Family History No family history on file.  Social History Social History   Tobacco Use  . Smoking status: Not on file  Substance Use Topics  . Alcohol use: Not on file  . Drug use: Not on file     Allergies   Asa [aspirin] and Demerol [meperidine hcl]   Review of Systems Review of Systems  All other systems reviewed and are negative.  Physical Exam Updated Vital Signs BP 114/66   Pulse [name])  51   Temp (!) 97.5 F (36.4 C)   Resp 16   Ht '6\' 2"'$  (1.88 m)   Wt 99.8 kg   SpO2 100%   BMI 28.25 kg/m   Physical Exam  Constitutional: He appears well-developed and well-nourished.  HENT:  Head: Normocephalic and atraumatic.  Epistaxis noted  Eyes: Pupils are equal, round, and reactive to light. Conjunctivae are normal. Right eye exhibits no discharge. Left eye exhibits no discharge. No scleral icterus.  Pupils equal round and minimally reactive to light  Neck: Normal range of motion. No JVD present. No tracheal deviation present.  Cardiovascular:   Irregularly irregular rhythm    Pulmonary/Chest: Effort normal. No stridor.  Equal chest rise with assisted ventilations  Abdominal:  Colostomy left lower quadrant  Musculoskeletal:  Minor edema in the bilateral lower extremities  Neurological: Coordination normal.  Patient moaning reacting to pain  Skin: Skin is warm.  Psychiatric: He has a normal mood and affect. His behavior is normal. Judgment and thought content normal.  Nursing note and vitals reviewed.    ED Treatments / Results  Labs (all labs ordered are listed, but only abnormal results are displayed) Labs Reviewed  COMPREHENSIVE METABOLIC PANEL - Abnormal; Notable for the following components:      Result Value   Sodium 126 (*)    Potassium 6.1 (*)    Chloride 89 (*)    Glucose, Bld 196 (*)    Calcium 8.6 (*)    Albumin 2.7 (*)    AST 58 (*)    ALT 50 (*)    Alkaline Phosphatase 37 (*)    GFR calc non Af Amer 59 (*)    All other components within normal limits  BRAIN NATRIURETIC PEPTIDE - Abnormal; Notable for the following components:   B Natriuretic Peptide 432.6 (*)    All other components within normal limits  TROPONIN I - Abnormal; Notable for the following components:   Troponin I 0.03 (*)    All other components within normal limits  CBC WITH DIFFERENTIAL/PLATELET - Abnormal; Notable for the following components:   WBC 11.2 (*)    RBC 3.45 (*)    Hemoglobin 10.2 (*)    HCT 32.4 (*)    Monocytes Absolute 1.2 (*)    Abs Immature Granulocytes 0.3 (*)    All other components within normal limits  PROTIME-INR - Abnormal; Notable for the following components:   Prothrombin Time 22.6 (*)    All other components within normal limits  URINALYSIS, ROUTINE W REFLEX MICROSCOPIC - Abnormal; Notable for the following components:   APPearance HAZY (*)    Hgb urine dipstick LARGE (*)    Protein, ur 100 (*)    RBC / HPF >50 (*)    Bacteria, UA FEW (*)    All other components within normal limits  CBG MONITORING, ED - Abnormal; Notable for  the following components:   Glucose-Capillary 150 (*)    All other components within normal limits  I-STAT CHEM 8, ED - Abnormal; Notable for the following components:   Sodium 124 (*)    Potassium 5.9 (*)    Chloride 89 (*)    Glucose, Bld 196 (*)    Calcium, Ion 1.08 (*)    Hemoglobin 11.2 (*)    HCT 33.0 (*)    All other components within normal limits  I-STAT CG4 LACTIC ACID, ED - Abnormal; Notable for the following components:   Lactic Acid, Venous 3.43 (*)    All  other components within normal limits  I-STAT ARTERIAL BLOOD GAS, ED - Abnormal; Notable for the following components:   pCO2 arterial 52.5 (*)    pO2, Arterial 172.0 (*)    Bicarbonate 31.8 (*)    TCO2 33 (*)    Acid-Base Excess 6.0 (*)    All other components within normal limits  CULTURE, BLOOD (ROUTINE X 2)  CULTURE, BLOOD (ROUTINE X 2)  CULTURE, RESPIRATORY  RAPID URINE DRUG SCREEN, HOSP PERFORMED  ETHANOL  MAGNESIUM  TROPONIN I  TROPONIN I  PROCALCITONIN  PROCALCITONIN  RENAL FUNCTION PANEL  CBC  MAGNESIUM  HEMOGLOBIN A1C  I-STAT TROPONIN, ED  I-STAT CG4 LACTIC ACID, ED  TYPE AND SCREEN  ABO/RH    EKG None  Radiology Ct Head Wo Contrast  Result Date: 04/15/2018 CLINICAL DATA:  C-spine trauma.  Cardiac arrest.  CPR for 5 minutes. EXAM: CT HEAD WITHOUT CONTRAST CT CERVICAL SPINE WITHOUT CONTRAST TECHNIQUE: Multidetector CT imaging of the head and cervical spine was performed following the standard protocol without intravenous contrast. Multiplanar CT image reconstructions of the cervical spine were also generated. COMPARISON:  CT scan February 21, 2017 FINDINGS: CT HEAD FINDINGS Brain: No subdural, epidural, or subarachnoid hemorrhage. Cerebellum demonstrates no acute abnormalities. A tiny lacunar infarct is seen in the right cerebellar hemisphere on axial image 8. Basal cisterns are unchanged unremarkable. Ventricles and sulci are stable. No acute cortical ischemia or infarct. No mass effect or  midline shift. Vascular: Calcified atherosclerosis in the intracranial carotids. Skull: Normal. Negative for fracture or focal lesion. Sinuses/Orbits: There is opacification of scattered left mastoid air cells, improved since February 21, 2017. The right mastoid air cells, middle ears are normal. Scattered opacification of ethmoid air cells with fluid in the sphenoid sinuses and a mucous retention cyst in the left maxillary sinus. Other: None. CT CERVICAL SPINE FINDINGS Alignment: Normal. Skull base and vertebrae: No acute fracture. No primary bone lesion or focal pathologic process. Soft tissues and spinal canal: No prevertebral fluid or swelling. No visible canal hematoma. Disc levels:  Multilevel degenerative changes. Upper chest: Mild opacity in the posterior aspect of the right upper lobe. Other: No other abnormalities. IMPRESSION: 1. No acute intracranial abnormalities. 2. No fracture or traumatic malalignment cervical spine. Multilevel degenerative changes. 3. Sinus disease as above. Electronically Signed   By: Dorise Bullion III M.D   On: 04/18/2018 19:02   Ct Cervical Spine Wo Contrast  Result Date: 04/07/2018 CLINICAL DATA:  C-spine trauma.  Cardiac arrest.  CPR for 5 minutes. EXAM: CT HEAD WITHOUT CONTRAST CT CERVICAL SPINE WITHOUT CONTRAST TECHNIQUE: Multidetector CT imaging of the head and cervical spine was performed following the standard protocol without intravenous contrast. Multiplanar CT image reconstructions of the cervical spine were also generated. COMPARISON:  CT scan February 21, 2017 FINDINGS: CT HEAD FINDINGS Brain: No subdural, epidural, or subarachnoid hemorrhage. Cerebellum demonstrates no acute abnormalities. A tiny lacunar infarct is seen in the right cerebellar hemisphere on axial image 8. Basal cisterns are unchanged unremarkable. Ventricles and sulci are stable. No acute cortical ischemia or infarct. No mass effect or midline shift. Vascular: Calcified atherosclerosis in the  intracranial carotids. Skull: Normal. Negative for fracture or focal lesion. Sinuses/Orbits: There is opacification of scattered left mastoid air cells, improved since February 21, 2017. The right mastoid air cells, middle ears are normal. Scattered opacification of ethmoid air cells with fluid in the sphenoid sinuses and a mucous retention cyst in the left maxillary sinus. Other: None. CT CERVICAL SPINE  FINDINGS Alignment: Normal. Skull base and vertebrae: No acute fracture. No primary bone lesion or focal pathologic process. Soft tissues and spinal canal: No prevertebral fluid or swelling. No visible canal hematoma. Disc levels:  Multilevel degenerative changes. Upper chest: Mild opacity in the posterior aspect of the right upper lobe. Other: No other abnormalities. IMPRESSION: 1. No acute intracranial abnormalities. 2. No fracture or traumatic malalignment cervical spine. Multilevel degenerative changes. 3. Sinus disease as above. Electronically Signed   By: Dorise Bullion III M.D   On: 04/07/2018 19:02   Dg Chest Portable 1 View  Result Date: 04/11/2018 CLINICAL DATA:  Intubated EXAM: PORTABLE CHEST 1 VIEW COMPARISON:  02/19/2017, 02/21/2017 FINDINGS: Endotracheal tube tip is about 3.8 cm superior to the carina. Esophageal tube tip is below the diaphragm but non included. Post sternotomy changes. Cardiomegaly with mild central congestion. Similar appearance of left pleural blunting. Aortic atherosclerosis. No pneumothorax. IMPRESSION: 1. Endotracheal tube tip about 3.8 cm superior to the carina. Esophageal tube tip below the diaphragm but non included. 2. Cardiomegaly with vascular congestion Electronically Signed   By: Donavan Foil M.D.   On: 04/18/2018 17:15    Procedures Procedures (including critical care time)  CRITICAL CARE Performed by: Stevie Kern Demya Scruggs   Total critical care time: 45 minutes  Critical care time was exclusive of separately billable procedures and treating other  patients.  Critical care was necessary to treat or prevent imminent or life-threatening deterioration.  Critical care was time spent personally by me on the following activities: development of treatment plan with patient and/or surrogate as well as nursing, discussions with consultants, evaluation of patient's response to treatment, examination of patient, obtaining history from patient or surrogate, ordering and performing treatments and interventions, ordering and review of laboratory studies, ordering and review of radiographic studies, pulse oximetry and re-evaluation of patient's condition.  INTUBATION Performed by: Stevie Kern Suann Klier  Required items: required blood products, implants, devices, and special equipment available Patient identity confirmed: provided demographic data and hospital-assigned identification number Time out: Immediately prior to procedure a "time out" was called to verify the correct patient, procedure, equipment, support staff and site/side marked as required.  Indications: Unresponsive  Intubation method: Glidescope Laryngoscopy   Preoxygenation: BVM  Sedatives: Etomidate Paralytic: Rocuronium  Tube Size: 7.5 cuffed  Post-procedure assessment: chest rise and ETCO2 monitor Breath sounds: equal and absent over the epigastrium Tube secured with: ETT holder Chest x-ray interpreted by radiologist and me.  Chest x-ray findings: endotracheal tube in appropriate position  Patient tolerated the procedure well with no immediate complications.    Medications Ordered in ED Medications  insulin aspart (novoLOG) injection 0-9 Units (has no administration in time range)  chlorhexidine gluconate (MEDLINE KIT) (PERIDEX) 0.12 % solution 15 mL (has no administration in time range)  MEDLINE mouth rinse (has no administration in time range)  ipratropium-albuterol (DUONEB) 0.5-2.5 (3) MG/3ML nebulizer solution 3 mL (has no administration in time range)  docusate  (COLACE) 50 MG/5ML liquid 100 mg (has no administration in time range)  bisacodyl (DULCOLAX) suppository 10 mg (has no administration in time range)  DOPamine (INTROPIN) 800 mg in dextrose 5 % 250 mL (3.2 mg/mL) infusion (5 mcg/kg/min  99.8 kg Intravenous New Bag/Given 04/24/2018 2007)  etomidate (AMIDATE) injection (30 mg Intravenous Given 04/18/2018 1612)  rocuronium (ZEMURON) injection (100 mg Intravenous Given 04/01/2018 1614)  sodium chloride 0.9 % bolus 1,000 mL (0 mLs Intravenous Stopped 04/01/2018 1732)  propofol (DIPRIVAN) 1000 MG/100ML infusion (0 mcg/kg/min  99.8 kg  Intravenous Stopped 04/22/2018 1930)     Initial Impression / Assessment and Plan / ED Course  I have reviewed the triage vital signs and the nursing notes.  Pertinent labs & imaging results that were available during my care of the patient were reviewed by me and considered in my medical decision making (see chart for details).     Labs: CBG, type and screen, blood culture, ABO Rh my stat lactic acid, urine drug screen, urinalysis, i-STAT Chem-8, i-STAT lactic acid, i-STAT troponin, BNP CMP  Imaging: CT head without, chest portable  Consults: Critical care  Therapeutics:  Discharge Meds:   Assessment/Plan: 84 year old male presents today as per CPR.  Upon initial presentation patient in A. fib with no signs of ST elevation or depression.  Patient was intubated to control airway, he was responding to pain at that time.  He had equal round minimally reactive pupils.   Head CT without acute findings  Care consulted for ongoing evaluation and management.    Final Clinical Impressions(s) / ED Diagnoses   Final diagnoses:  Cardiac arrest [facility])    ED Discharge Orders    None       Okey Regal, PA-C 04/10/2018 2019    Sherwood Gambler, MD 04/13/2018 2356

## 2018-03-26 NOTE — ED Notes (Signed)
[place], [place] @ [phone]

## 2018-03-26 NOTE — H&P (Addendum)
NAME:  Terry Wu, MRN:  [id number], DOB:  12/04/1931, LOS: 0 ADMISSION DATE:  04/11/2018, CONSULTATION DATE:  04/01/2018 REFERRING MD:  Dr. Penni Bombard , CHIEF COMPLAINT:  Cardiac arrest   Brief History   84 year old presenting from home.  Witnessed unresponsiveness. 5 mins later on EMS arrival found pulseless and ROSC obtained after 5 mins without defib or meds.  Required intubation for airway protection with some purposeful movement in ER.  CT cervical and CTH negative.  EKG non acute.  Past Medical History  Aortic aneurysm, chronic atrial fibrillation on Eliquis, CAD s/p CABG/AVR 2006, HTN, type 2 DM, HLD, prostate cancer with radioactive seed implant, colon cancer s/p resection and colostomy 02/07/2017 with post-op CVA, COPD  Significant Hospital Events   10/2 Admit  Consults: date of consult/date signed off & final recs:  10/2 PCCM   Procedures (surgical and bedside):  10/2 ETT >>  Significant Diagnostic Tests:  04/20/2018 CT cervical & head >> 1. No acute intracranial abnormalities. 2. No fracture or traumatic malalignment cervical spine. Multilevel degenerative changes. 3. Sinus disease as above.  10/2 CXR >> 1. Endotracheal tube tip about 3.8 cm superior to the carina.  Esophageal tube tip below the diaphragm but non included. 2. Cardiomegaly with vascular congestion  Micro Data:  10/2 BC >> 10/2 MRSA PCR >>  Antimicrobials:  none  Subjective:  On propofol 30 mcg/kg/min  Objective   Blood pressure 109/64, pulse (!) 58, temperature 98.2 F (36.8 C), temperature source Temporal, resp. rate 16, height 6\' 2"  (1.88 m), weight 99.8 kg, SpO2 100 %.    Vent Mode: PRVC FiO2 (%):  [60 %] 60 % Set Rate:  [16 bmp] 16 bmp Vt Set:  [720 mL] 660 mL PEEP:  [5 cmH20] 5 cmH20 Plateau Pressure:  [20 cmH20] 20 cmH20   Intake/Output Summary (Last 24 hours) at 03/27/2018 1923 Last data filed at 04/21/2018 1732 Gross per 24 hour  Intake 1000 ml  Output -  Net 1000 ml   Filed  Weights   03/29/2018 1700  Weight: 99.8 kg   Examination: General:  Critically ill on chronically ill appearing elderly male sedate on MV in NAD HEENT:  [place]/ AT, pupils pinpoint, +corneals, ETT 7.5 cm at 23, OGT- bilious Neuro: spont moves LE > UE, does not f/c or localize to pain, no eye opening CV: IRIR, bradycardic 40-50's, no murmur, +1 distal pulses, cool  PULM: even/non-labored on MV, not breathing over set rate 16, +cough, lungs coarse anteriorly, diminished in bases  GI: obese, protuberant, +BS, LLQ ostomy pink with soft brown stool Extremities: cool/dry, +2-3 BLE, chronic venous stasis BLE Skin: no rashes   Resolved Hospital Problem list    Assessment & Plan:  Cardiac Arrest - unclear etiology at this time - did not require defib or meds prior to  - ROSC after estimated 5 mins prior to EMS arrival and after 5 mins of CPR  - CTH negative P:  Admit to ICU Goal of normothermia for 48 hours  Frequent neuro assessments Hold sedation to assess mental status  Trend troponin and EKG TTE in am   Acute respiratory insufficiency in the setting of cardiac arrest COPD- no home nebs, never smoker - CXR personally reviewed- ETT/ OGT good position, wide mediastinum, possible RUL opacity, LLL atelectasis   P:  Full MV support 8cc/kg, Rate 16 Wean peep/ FiO2 for sats >88-94% duonebs prn  Daily SBT if meets criteria ABG in am   Bradycardic Afib  -  on Eliquis - rate dropped after propofol  P:  D/c propofol Add dopamine  Consult pharmacy for heparin gtt    Leukocytosis  P:  Follow blood cultures Send PCT and trach aspirate Monitor clinically for now   DM P:  CBG q 4 SSI sensitive    HTN, diastolic HF, CAD s/p CABG/AVR  - prior EF 55-60 (01/2017) P:  Hold lasix and coreg given hypotension Heparin gtt as above   Normocytic anemia - no signs of bleeding, on Eliquis - normal coags  P:  Trend H/H  Hyponatremia- chronic P:  Trend BMP / urinary output/ daily  wts  Hyperkalemia  - normal sCr P:  Repeat Renal panel and mag now   Disposition / Summary of Today's Plan 04/04/2018   Admit to ICU stabilize HR/ BP Assess neuro status  Normothermia x 48 hours     Diet: NPO Pain/Anxiety/Delirium protocol (if indicated): hold for now to assess mental status  VAP protocol (if indicated): yes DVT prophylaxis: heparin gtt GI prophylaxis: protonix PO Hyperglycemia protocol: SSI sensitive Mobility: bedrest Code Status: Full  Family Communication: Wife and daughter updated at bedside.   Labs   CBC: Recent Labs  Lab 03/30/2018 1610 04/18/2018 1707  WBC 11.2*  --   NEUTROABS 7.6  --   HGB 10.2* 11.2*  HCT 32.4* 33.0*  MCV 93.9  --   PLT 214  --     Basic Metabolic Panel: Recent Labs  Lab 04/24/2018 1610 04/16/2018 1707  NA 126* 124*  K 6.1* 5.9*  CL 89* 89*  CO2 27  --   GLUCOSE 196* 196*  BUN 16 20  CREATININE 1.11 1.10  CALCIUM 8.6*  --    GFR: Estimated Creatinine Clearance: 63.1 mL/min (by C-G formula based on SCr of 1.1 mg/dL). Recent Labs  Lab 04/16/2018 1610 03/28/2018 1707 04/11/2018 1809  WBC 11.2*  --   --   LATICACIDVEN  --  3.43* 1.81    Liver Function Tests: Recent Labs  Lab 04/20/2018 1610  AST 58*  ALT 50*  ALKPHOS 37*  BILITOT 0.9  PROT 6.6  ALBUMIN 2.7*   No results for input(s): LIPASE, AMYLASE in the last 168 hours. No results for input(s): AMMONIA in the last 168 hours.  ABG    Component Value Date/Time   PHART 7.390 04/20/2018 1647   PCO2ART 52.5 (H) 04/06/2018 1647   PO2ART 172.0 (H) 04/18/2018 1647   HCO3 31.8 (H) 04/04/2018 1647   TCO2 32 04/12/2018 1707   O2SAT 99.0 04/02/2018 1647     Coagulation Profile: Recent Labs  Lab 03/25/2018 1610  INR 2.01    Cardiac Enzymes: Recent Labs  Lab 04/07/2018 1610  TROPONINI 0.03*    HbA1C: No results found for: HGBA1C  CBG: Recent Labs  Lab 04/17/2018 1855  GLUCAP 150*    Admitting History of Present Illness.   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  Wife reports patient was in his usual state of health.  Bent over and complained of mild shortness of breath and dizziness and then slumped over in the chair unresponsive.  By the time family assisted him to the floor, EMS  arrived (~5 mins) and patient found pulseless.  CPR was given with ROSC after 5 mins.  No defib or medications given.  King airway placed and patient having purposeful movement on arrival to ER.  EKG on arrival to ER afib, rate 81 without ST elevation or depression.  Labs noted for Na 124, K 5.9, glucose 196, Lactic 3.43 ->1.81, Hgb 10.2, UA neg, UDS neg, trop 0.03, INR 2.01, PT 22.6, BNP 432.3.  King airway exchanged for ETT and placed on propofol for sedation where he became bradycardic and hypotensive.  PCCM called for admission.    Review of Systems:   Unable to obtain as patient is intubated on mechanical ventilation.   Past Medical History  He,  has no past medical history on file.  Of note, there are 2 charts.  See chart w/ MRN [id number]  Surgical History   Of note, there are 2 charts.  See chart w/ MRN [id number] and HPI for further history.  Social History   Social History   Socioeconomic History  . Marital status: Married    Spouse name: Not on file  . Number of children: Not on file  . Years of education: Not on file  . Highest education level: Not on file  Occupational History  . Not on file  Social Needs  . Financial resource strain: Not on file  . Food insecurity:    Worry: Not on file    Inability: Not on file  . Transportation needs:    Medical: Not on file     Non-medical: Not on file  Tobacco Use  . Smoking status: Not on file  Substance and Sexual Activity  . Alcohol use: Not on file  . Drug use: Not on file  . Sexual activity: Not on file  Lifestyle  . Physical activity:    Days per week: Not on file    Minutes per session: Not on file  . Stress: Not on file  Relationships  . Social connections:    Talks on phone: Not on file    Gets together: Not on file    Attends religious service: Not on file    Active member of club or organization: Not on file    Attends meetings of clubs or organizations: Not on file    Relationship status: Not on file  . Intimate partner violence:    Fear of current or ex partner: Not on file    Emotionally abused: Not on file    Physically abused: Not on file    Forced sexual activity: Not on file  Other Topics Concern  . Not on file  Social History Narrative  . Not on file  ,     Family History   His family history is not on file.   Allergies Allergies  Allergen Reactions  . Asa [Aspirin] Anaphylaxis and Swelling    Throat swells   . Demerol [Meperidine Hcl] Shortness Of Breath, Swelling and Other (See Comments)    "Suppresses vagus nerve" (occurred during a procedure)     Home Medications  Prior to Admission medications   Medication Sig Start Date End Date Taking? Authorizing Provider  amoxicillin (AMOXIL) 500 MG capsule Take 2,000 mg by mouth See admin instructions. Take 2,000 mg by mouth one hour prior to procedure(s), then as directed 01/18/18  Yes [provider]  atorvastatin (LIPITOR) 20 MG tablet Take 20 mg by mouth every evening.  01/04/18  Yes [provider]  carvedilol (COREG) 3.125  MG tablet Take 3.125 mg by mouth 2 (two) times daily with a meal. 03/24/18  Yes [provider]  ELIQUIS 5 MG TABS tablet Take 5 mg by mouth 2 (two) times daily. 03/07/18  Yes [provider]  furosemide (LASIX) 40 MG tablet Take 40 mg by mouth 2 (two) times daily.  12/24/17  Yes [provider]  glimepiride (AMARYL) 2 MG tablet Take 1 mg by mouth daily with breakfast.   Yes [provider]  Multiple Vitamins-Minerals (PRESERVISION AREDS 2+MULTI VIT) CAPS Take 1 capsule by mouth 2 (two) times daily.   Yes [provider]  pantoprazole (PROTONIX) 40 MG tablet Take 40 mg by mouth daily before lunch.  02/12/18  Yes [provider]  potassium chloride (K-DUR,KLOR-CON) 10 MEQ tablet Take 10 mEq by mouth 2 (two) times daily.   Yes [provider]  timolol (TIMOPTIC) 0.5 % ophthalmic solution Place 1 drop into both eyes 2 (two) times daily. 03/07/18  Yes [provider]     CCT 60 mins  Kennieth Rad, AGACNP-BC [place] Pgr: [phone] or if no answer [phone] 04/18/2018, 8:48 PM

## 2018-03-26 NOTE — Progress Notes (Signed)
   04/10/2018 1800  Clinical Encounter Type  Visited With Family;Patient not available  Visit Type Initial;Code;Critical Care;ED  Referral From Nurse  Spiritual Encounters  Spiritual Needs Emotional  CH met with family in consult room and coordinated family visit with RN in trauma room; [place] has monitored and checked in with family as needed; [place] offered emotional support and is available as needed. Gwynn Burly 6:19 PM

## 2018-03-26 NOTE — Progress Notes (Signed)
Transported pt from ED to 2H-23 without any complications.

## 2018-03-26 NOTE — Progress Notes (Signed)
Pt transported from ED TRAUMA C to CT and back without incident. °

## 2018-03-27 ENCOUNTER — Inpatient Hospital Stay (HOSPITAL_COMMUNITY): Payer: Medicare Other

## 2018-03-27 ENCOUNTER — Other Ambulatory Visit (HOSPITAL_COMMUNITY): Payer: Medicare Other

## 2018-03-27 DIAGNOSIS — G931 Anoxic brain damage, not elsewhere classified: Secondary | ICD-10-CM

## 2018-03-27 DIAGNOSIS — I4891 Unspecified atrial fibrillation: Secondary | ICD-10-CM

## 2018-03-27 DIAGNOSIS — L8990 Pressure ulcer of unspecified site, unspecified stage: Secondary | ICD-10-CM

## 2018-03-27 LAB — RENAL FUNCTION PANEL
ALBUMIN: 2.7 g/dL — AB (ref 3.5–5.0)
Anion gap: 11 (ref 5–15)
BUN: 16 mg/dL (ref 8–23)
CO2: 28 mmol/L (ref 22–32)
CREATININE: 0.91 mg/dL (ref 0.61–1.24)
Calcium: 8.5 mg/dL — ABNORMAL LOW (ref 8.9–10.3)
Chloride: 90 mmol/L — ABNORMAL LOW (ref 98–111)
GFR calc Af Amer: >60 mL/min (ref >60)
Glucose, Bld: 90 mg/dL (ref 70–99)
PHOSPHORUS: 3.3 mg/dL (ref 2.5–4.6)
POTASSIUM: 4.1 mmol/L (ref 3.5–5.1)
Sodium: 129 mmol/L — ABNORMAL LOW (ref 135–145)

## 2018-03-27 LAB — HEPARIN LEVEL (UNFRACTIONATED): Heparin Unfractionated: >2.2 IU/mL — ABNORMAL HIGH (ref 0.30–0.70)

## 2018-03-27 LAB — APTT
aPTT: 98 seconds — ABNORMAL HIGH (ref 24–36)
aPTT: 115 seconds — ABNORMAL HIGH (ref 24–36)

## 2018-03-27 LAB — PROCALCITONIN
Procalcitonin: <0.1 ng/mL
Procalcitonin: 0.11 ng/mL

## 2018-03-27 LAB — CBC
HCT: 33.8 % — ABNORMAL LOW (ref 39.0–52.0)
Hemoglobin: 11 g/dL — ABNORMAL LOW (ref 13.0–17.0)
MCH: 29.3 pg (ref 26.0–34.0)
MCHC: 32.5 g/dL (ref 30.0–36.0)
MCV: 90.1 fL (ref 78.0–100.0)
PLATELETS: 196 10*3/uL (ref 150–400)
RBC: 3.75 MIL/uL — ABNORMAL LOW (ref 4.22–5.81)
RDW: 14.3 % (ref 11.5–15.5)
WBC: 14.1 10*3/uL — AB (ref 4.0–10.5)

## 2018-03-27 LAB — TROPONIN I
TROPONIN I: 0.06 ng/mL — AB (ref <0.03)
Troponin I: 0.09 ng/mL (ref <0.03)

## 2018-03-27 LAB — GLUCOSE, CAPILLARY
GLUCOSE-CAPILLARY: 66 mg/dL — AB (ref 70–99)
GLUCOSE-CAPILLARY: 155 mg/dL — AB (ref 70–99)
GLUCOSE-CAPILLARY: 76 mg/dL (ref 70–99)
Glucose-Capillary: 89 mg/dL (ref 70–99)
Glucose-Capillary: 75 mg/dL (ref 70–99)
Glucose-Capillary: 78 mg/dL (ref 70–99)

## 2018-03-27 LAB — TSH: TSH: 3.192 u[IU]/mL (ref 0.350–4.500)

## 2018-03-27 LAB — MRSA PCR SCREENING: MRSA BY PCR: NEGATIVE

## 2018-03-27 LAB — MAGNESIUM: MAGNESIUM: 1.9 mg/dL (ref 1.7–2.4)

## 2018-03-27 LAB — AMMONIA: AMMONIA: 35 umol/L (ref 9–35)

## 2018-03-27 MED ORDER — CHLORHEXIDINE GLUCONATE 0.12% ORAL RINSE (MEDLINE KIT)
15.0000 mL | Freq: Two times a day (BID) | OROMUCOSAL | Status: DC
  Administered 2018-03-27 – 2018-03-28 (×3): 15 mL via OROMUCOSAL

## 2018-03-27 MED ORDER — DEXTROSE-NACL 5-0.9 % IV SOLN
INTRAVENOUS | Status: DC
  Administered 2018-03-27 – 2018-03-28 (×2): via INTRAVENOUS

## 2018-03-27 MED ORDER — DEXTROSE 50 % IV SOLN
50.0000 mL | Freq: Once | INTRAVENOUS | Status: AC
  Administered 2018-03-27: 50 mL via INTRAVENOUS

## 2018-03-27 MED ORDER — ORAL CARE MOUTH RINSE: 15.0000 mL | OROMUCOSAL | Status: DC

## 2018-03-27 NOTE — Progress Notes (Signed)
ANTICOAGULATION CONSULT NOTE - Follow Up Consult  Pharmacy Consult for Heparin Indication: atrial fibrillation  Allergies  Allergen Reactions  . Asa [Aspirin] Anaphylaxis and Swelling    Throat swells   . Demerol [Meperidine Hcl] Shortness Of Breath, Swelling and Other (See Comments)    "Suppresses vagus nerve" (occurred during a procedure)    Patient Measurements: Height: 6\' 2"  (188 cm) Weight: 220 lb (99.8 kg) IBW/kg (Calculated) : 82.2 Heparin Dosing Weight: 99  Vital Signs: Temp: 100 F (37.8 C) (10/03 1500) Temp Source: Bladder (10/03 0800) BP: 119/69 (10/03 1500) Pulse Rate: 78 (10/03 1500)  Labs: Recent Labs    03/25/2018 1610 04/04/2018 1707 04/23/2018 2221 03/27/18 0410 03/27/18 0617 03/27/18 1028 03/27/18 1350  HGB 10.2* 11.2*  --  11.0*  --   --   --   HCT 32.4* 33.0*  --  33.8*  --   --   --   PLT 214  --   --  196  --   --   --   APTT  --   --   --   --  98*  --  115*  LABPROT 22.6*  --   --   --   --   --   --   INR 2.01  --   --   --   --   --   --   HEPARINUNFRC  --   --   --   --  >2.20*  --   --   CREATININE 1.11 1.10 0.92 0.91  --   --   --   TROPONINI 0.03*  --  0.10* 0.09*  --  0.06*  --     Estimated Creatinine Clearance: 76.2 mL/min (by C-G formula based on SCr of 0.91 mg/dL).  Assessment: 84 yo M admitted s/p cardiac arrest. Pt was on Eliquis PTA for Afib and started on IV heparin while in ICU. Per med rec, last dose of Eliquis was PTA on 10/2 at 0500. aPTT is above goal range (aPTT 115). Will continue to dose heparin off aPTT since HL and aPTT are not correlating.  RN reports pt is bleeding from subglottic line and has pink-tinted urine, but stable and no change from this AM. Will continue to monitor for further s/sxs bleeding. CBC stable and no infusion issues per nursing.  Goal of Therapy:  Heparin level 0.3-0.7 units/ml  APTT 66-102 seconds Monitor platelets by anticoagulation protocol: Yes   Plan:  -Decrease heparin 1200  units/hr -Check 8h aPTT -Monitor s/sxs bleeding  Richardine Service, PharmD Candidate 03/27/2018,3:37 PM

## 2018-03-27 NOTE — Plan of Care (Signed)
°  Problem: Clinical Measurements: °Goal: Respiratory complications will improve °Outcome: Progressing °Goal: Cardiovascular complication will be avoided °Outcome: Progressing °  °Problem: Elimination: °Goal: Will not experience complications related to urinary retention °Outcome: Progressing °  °Problem: Safety: °Goal: Ability to remain free from injury will improve °Outcome: Progressing °  °

## 2018-03-27 NOTE — Progress Notes (Signed)
Initial Nutrition Assessment  DOCUMENTATION CODES:   Obesity unspecified  INTERVENTION:   If unable to extubate within 24 hr, recommend initiation of TF: Vital High Protein @ 70 mL/hr Provides 1680 kcal, 148 g protein, and 1411 mL free water. Meets 102% calorie needs and 100% protein needs.  NUTRITION DIAGNOSIS:   Inadequate oral intake related to acute illness as evidenced by NPO status.  GOAL:   Patient will meet greater than or equal to 90% of their needs  MONITOR:   Skin, Vent status, Labs, TF tolerance  REASON FOR ASSESSMENT:   Ventilator   ASSESSMENT:   84 yo male admitted s/p cardiac arrest. PMHx aoritc aneurysm, CAD s/p CABG/AVR, HTN, T2DM, HLD, prostate cancer, colon cancer s/p resection and colostomy 2018, COPD. CPAP/PSV.    Planning to extubate without reintubation. Per nsg, pt has been weaning all day and they will wait as long as possible before extubating. Pt off sedation. If pt to remain intubated beyond 10/4, TF regimen outlined in intervention. OGT in place. Noted episode of hypoglycemia. MD starting on D5 with infusion.  Labs: sodium 129, choride 90  Meds: Protonix 40mg  via OGT  Pt family present at time of visit. Wife reports pt has good appetite, normally eats 3 meals/day. Pt follows diabetic and ostomy diets. Pt uses a walker to stand and position himself, and uses it when leaving the house. Recently, pt fell and has lost confidence using the walker, has been getting up and around less frequently. Wife states pt height is 5'9", not 6'2". Wife reports pt weight has been stable, but does not know pt dry weight. Pt currently has moderate lower extremity edema. Unsure if admission wt was stated or measured. No previous wt encounters. Will follow wt trend and adjust estimated needs as necessary.    NUTRITION - FOCUSED PHYSICAL EXAM:    Most Recent Value  Orbital Region  No depletion  Upper Arm Region  No depletion  Thoracic and Lumbar Region  No depletion   Buccal Region  No depletion  Temple Region  No depletion  Clavicle Bone Region  No depletion  Clavicle and Acromion Bone Region  No depletion  Scapular Bone Region  No depletion  Dorsal Hand  No depletion  Patellar Region  No depletion  Anterior Thigh Region  Mild depletion  Posterior Calf Region  Unable to assess [edema]  Edema (RD Assessment)  Moderate  Hair  Reviewed  Eyes  Unable to assess  Mouth  Unable to assess  Skin  Reviewed [venous stasis ulcers, pressure ulcer]  Nails  Reviewed     Diet Order:   Diet Order            Diet NPO time specified  Diet effective now             EDUCATION NEEDS:   No education needs have been identified at this time  Skin:  Skin Assessment: Skin Integrity Issues: Skin Integrity Issues:: Stage III Stage III: buttocks  Last BM:  no BM  Height:  Ht Readings from Last 1 Encounters:  03/27/18 5\' 9"  (1.753 m)   Weight:   Wt Readings from Last 1 Encounters:  03/27/18 115.3 kg   Ideal Body Weight:  72.7 kg  BMI:  Body mass index is 37.54 kg/m.  Estimated Nutritional Needs:   Kcal:  6644-0347 calories/day  Protein:  145 g protein/day  Fluid:  1.5L/day  Althea Grimmer, MS, RDN, LDN On-call pager: [phone]

## 2018-03-27 NOTE — H&P (Addendum)
NAME:  Terry Wu, MRN:  [id number], DOB:  07-05-1931, LOS: 1 ADMISSION DATE:  04/12/2018, CONSULTATION DATE:  04/16/2018 REFERRING MD:  Dr. Penni Bombard , CHIEF COMPLAINT:  Cardiac arrest   Brief History   84 year old presenting from home.  Witnessed unresponsiveness. 5 mins later on EMS arrival found pulseless and ROSC obtained after 5 mins without defib or meds.  Required intubation for airway protection with some purposeful movement in ER.  CT cervical and CTH negative.  EKG non acute.  Past Medical History  Aortic aneurysm, chronic atrial fibrillation on Eliquis, CAD s/p CABG/AVR 2006, HTN, type 2 DM, HLD, prostate cancer with radioactive seed implant, colon cancer s/p resection and colostomy 02/07/2017 with post-op CVA, COPD  Significant Hospital Events   10/2 Admit  Consults: date of consult/date signed off & final recs:  10/2 PCCM   Procedures (surgical and bedside):  10/2 ETT >>  Significant Diagnostic Tests:  04/06/2018 CT cervical & head >> 1. No acute intracranial abnormalities. 2. No fracture or traumatic malalignment cervical spine. Multilevel degenerative changes. 3. Sinus disease as above.  10/2 CXR >> 1. Endotracheal tube tip about 3.8 cm superior to the carina.  Esophageal tube tip below the diaphragm but non included. 2. Cardiomegaly with vascular congestion  Micro Data:  10/2 BC >> 10/2 MRSA PCR >>  Antimicrobials:  none  Subjective:  On propofol 30 mcg/kg/min  Objective   Blood pressure 117/61, pulse 83, temperature 99.5 F (37.5 C), resp. rate (!) 21, height 6\' 2"  (1.88 m), weight 99.8 kg, SpO2 100 %.    Vent Mode: CPAP;PSV FiO2 (%):  [30 %-60 %] 30 % Set Rate:  [16 bmp-18 bmp] 18 bmp Vt Set:  [550 mL-660 mL] 550 mL PEEP:  [5 cmH20] 5 cmH20 Pressure Support:  [5 cmH20] 5 cmH20 Plateau Pressure:  [15 cmH20-20 cmH20] 15 cmH20   Intake/Output Summary (Last 24 hours) at 03/27/2018 1028 Last data filed at 03/27/2018 1000 Gross per 24 hour  Intake  1197.74 ml  Output 2200 ml  Net -1002.26 ml   Filed Weights   04/02/2018 1700  Weight: 99.8 kg   Examination: General: Elderly male who arouses to voice follows commands HEENT: Tracheal tube in place orogastric tube in place Neuro: Arouses to voice follows commands opens eyes lips and 6 tongue out moves all extremities CV: Sounds are regular irregular PULM: even/non-labored, lungs bilaterally crease in the base GI: Colostomy noted no acute issue Extremities: warm/dry, 2+ edema  Skin: no rashes or lesions   Resolved Hospital Problem list    Assessment & Plan:  Cardiac Arrest Currently awake and alert Did not require medications to restore ROSC Continue to evaluate Follow in intensive care  Acute respiratory insufficiency in the setting of cardiac arrest COPD- no home nebs, never smoker - CXR personally reviewed- ETT/ OGT good position, wide mediastinum, possible RUL opacity, LLL atelectasis   P:  Wean as tolerated 03/27/2018 family deciding whether or not he will be reintubated if extubated and failed therefore we will not extubate to liberate that final decision.  Bradycardic Afib  - on Eliquis - rate dropped after propofol  P:  Off propofol Off dopamine Monitor   Leukocytosis  P:  Follow culture data White count   DM CBG (last 3)  Recent Labs    04/03/2018 2314 03/27/18 0410 03/27/18 0859  GLUCAP 123* 89 75    P:  Sliding scale   HTN, diastolic HF, CAD s/p CABG/AVR  - prior EF  55-60 (01/2017) P:  Hold diuretic Hold hypertensive medication    Normocytic anemia - no signs of bleeding, on Eliquis - normal coags  P:  Transfuse per protocol  Hyponatremia- chronic Recent Labs  Lab 03/31/2018 1707 04/01/2018 2221 03/27/18 0410  NA 124* 130* 129*    P:  Monitor electrolytes, daily weights, intake and output  Hyperkalemia  Recent Labs  Lab 04/02/2018 1707 04/08/2018 2221 03/27/18 0410  K 5.9* 4.2 4.1    - normal sCr P:  Monitor  electrolytes   Disposition / Summary of Today's Plan 03/27/18   Continue mechanical ventilatory support assess for extubation.  Family discussing questionable no reintubation if extubated. Stabilize hemodynamically Change to a limited CODE BLUE no shock no CPR Assess labs in a.m.    Diet: NPO Pain/Anxiety/Delirium protocol (if indicated): hold for now to assess mental status  VAP protocol (if indicated): yes DVT prophylaxis: heparin gtt GI prophylaxis: protonix PO Hyperglycemia protocol: SSI sensitive Mobility: bedrest Code Status: 03/27/2018 changed to limited CODE BLUE no shock no CPR Family Communication: Updated wife daughter at bedside.  Have requested limited CODE BLUE status  Labs   CBC: Recent Labs  Lab 04/02/2018 1610 04/15/2018 1707 03/27/18 0410  WBC 11.2*  --  14.1*  NEUTROABS 7.6  --   --   HGB 10.2* 11.2* 11.0*  HCT 32.4* 33.0* 33.8*  MCV 93.9  --  90.1  PLT 214  --  347    Basic Metabolic Panel: Recent Labs  Lab 04/07/2018 1610 04/06/2018 1707 03/29/2018 2221 03/27/18 0410  NA 126* 124* 130* 129*  K 6.1* 5.9* 4.2 4.1  CL 89* 89* 89* 90*  CO2 27  --  31 28  GLUCOSE 196* 196* 134* 90  BUN 16 20 16 16   CREATININE 1.11 1.10 0.92 0.91  CALCIUM 8.6*  --  9.0 8.5*  MG  --   --  2.0 1.9  PHOS  --   --  3.5 3.3   GFR: Estimated Creatinine Clearance: 76.2 mL/min (by C-G formula based on SCr of 0.91 mg/dL). Recent Labs  Lab 04/03/2018 1610 04/22/2018 1707 04/04/2018 1809 03/25/2018 2221 03/27/18 0410  PROCALCITON  --   --   --  <0.10 0.11  WBC 11.2*  --   --   --  14.1*  LATICACIDVEN  --  3.43* 1.81  --   --     Liver Function Tests: Recent Labs  Lab 03/28/2018 1610 04/22/2018 2221 03/27/18 0410  AST 58*  --   --   ALT 50*  --   --   ALKPHOS 37*  --   --   BILITOT 0.9  --   --   PROT 6.6  --   --   ALBUMIN 2.7* 2.8* 2.7*   No results for input(s): LIPASE, AMYLASE in the last 168 hours. Recent Labs  Lab 03/27/18 0617  AMMONIA 35    ABG    Component  Value Date/Time   PHART 7.410 04/12/2018 2130   PCO2ART 49.7 (H) 04/23/2018 2130   PO2ART 306 (H) 03/28/2018 2130   HCO3 31.2 (H) 03/25/2018 2130   TCO2 32 04/08/2018 1707   O2SAT 99.7 04/15/2018 2130     Coagulation Profile: Recent Labs  Lab 04/10/2018 1610  INR 2.01    Cardiac Enzymes: Recent Labs  Lab 04/19/2018 1610 03/30/2018 2221 03/27/18 0410  TROPONINI 0.03* 0.10* 0.09*    HbA1C: No results found for: HGBA1C  CBG: Recent Labs  Lab 04/19/2018 1855 03/28/2018 2314  03/27/18 0410 03/27/18 0859  GLUCAP 150* 123* 89 75    Admitting History of Present Illness.   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  Wife reports patient was in his usual state of health.  Bent over and complained of mild shortness of breath and dizziness and then slumped over in the chair unresponsive.  By the time family assisted him to the floor, EMS arrived (~5 mins) and patient found pulseless.  CPR was given with ROSC after 5 mins.  No defib or medications given.  King airway placed and patient having purposeful movement on arrival to ER.  EKG on arrival to ER afib, rate 81 without ST elevation or depression.  Labs noted for Na 124, K 5.9, glucose 196, Lactic 3.43 ->1.81, Hgb 10.2, UA neg, UDS neg, trop 0.03, INR 2.01, PT 22.6, BNP 432.3.  King airway exchanged for ETT and placed on propofol for sedation where he became bradycardic and hypotensive.  PCCM called for admission.    Review of Systems:   Unable to obtain as patient is intubated on mechanical ventilation.   Past Medical History  He,  has no past medical history on file.   Of note, there are 2 charts.  See chart w/ MRN [id number]  Surgical History   Of note, there are 2 charts.  See chart w/ MRN [id number] and HPI for further history.  Social History   Social History   Socioeconomic History  . Marital status: Married    Spouse name: Not on file  . Number of children: Not on file  . Years of education: Not on file  . Highest education level: Not on file  Occupational History  . Not on file  Social Needs  . Financial resource strain: Not on file  . Food insecurity:    Worry: Not on file    Inability: Not on file  . Transportation needs:    Medical: Not on file    Non-medical: Not on file  Tobacco Use  . Smoking status: Not on file  Substance and Sexual Activity  . Alcohol use: Not on file  . Drug use: Not on file  . Sexual activity: Not on file  Lifestyle  . Physical activity:    Days per week: Not on file    Minutes per session: Not on file  . Stress: Not on file  Relationships  . Social connections:    Talks on phone: Not on file    Gets together: Not on file    Attends religious service: Not on file    Active member of club or organization: Not on file    Attends meetings of clubs or organizations: Not on file    Relationship status: Not on file  . Intimate partner violence:    Fear of current or ex partner: Not on file    Emotionally abused: Not on file    Physically abused: Not on file    Forced sexual activity: Not on file  Other Topics Concern  . Not on file  Social History Narrative  . Not on file  ,     Family History   His family history is  not on file.   Allergies Allergies  Allergen Reactions  . Asa [Aspirin] Anaphylaxis and Swelling    Throat swells   . Demerol [Meperidine Hcl] Shortness Of Breath, Swelling and Other (See Comments)    "Suppresses vagus nerve" (occurred during a procedure)     Home Medications  Prior to Admission medications   Medication Sig Start Date End Date Taking? Authorizing Provider    amoxicillin (AMOXIL) 500 MG capsule Take 2,000 mg by mouth See admin instructions. Take 2,000 mg by mouth one hour prior to procedure(s), then as directed 01/18/18  Yes [provider]  atorvastatin (LIPITOR) 20 MG tablet Take 20 mg by mouth every evening.  01/04/18  Yes [provider]  carvedilol (COREG) 3.125 MG tablet Take 3.125 mg by mouth 2 (two) times daily with a meal. 03/24/18  Yes [provider]  ELIQUIS 5 MG TABS tablet Take 5 mg by mouth 2 (two) times daily. 03/07/18  Yes [provider]  furosemide (LASIX) 40 MG tablet Take 40 mg by mouth 2 (two) times daily. 12/24/17  Yes [provider]  glimepiride (AMARYL) 2 MG tablet Take 1 mg by mouth daily with breakfast.   Yes [provider]  Multiple Vitamins-Minerals (PRESERVISION AREDS 2+MULTI VIT) CAPS Take 1 capsule by mouth 2 (two) times daily.   Yes [provider]  pantoprazole (PROTONIX) 40 MG tablet Take 40 mg by mouth daily before lunch.  02/12/18  Yes [provider]  potassium chloride (K-DUR,KLOR-CON) 10 MEQ tablet Take 10 mEq by mouth 2 (two) times daily.   Yes [provider]  timolol (TIMOPTIC) 0.5 % ophthalmic solution Place 1 drop into both eyes 2 (two) times daily. 03/07/18  Yes [provider]     CCT 60 mins  Richardson Landry Minor ACNP Maryanna Shape PCCM Pager [phone] till 1 pm If no answer page [phone] 03/27/2018, 10:28 AM  Attending Note:  84 year old male with extensive cardiac history presenting s/p cardiac arrest.  On exam, already manifesting signs of anoxic injury.  I reviewed CXR myself, ETT is in a good position.  Discussed with PCCM-NP.  Will maintain on full vent support for now.  Adjust vent for ABG.  Extensive discussion with family this AM, after discussion, decision was made to proceed with LCB with no CPR/cardioversion and discussing withdrawal likely in AM.  PCCM will continue to follow and discuss further in AM.  The  patient is critically ill with multiple organ systems failure and requires high complexity decision making for assessment and support, frequent evaluation and titration of therapies, application of advanced monitoring technologies and extensive interpretation of multiple databases.   Critical Care Time devoted to patient care services described in this note is  38  Minutes. This time reflects time of care of this signee Dr Jennet Maduro. This critical care time does not reflect procedure time, or teaching time or supervisory time of PA/NP/Med student/Med Resident etc but could involve care discussion time.  Rush Farmer, M.D. [facility] Pulmonary/Critical Care Medicine. Pager: [phone]. After hours pager: [phone].

## 2018-03-27 NOTE — Progress Notes (Signed)
RT suctioned patient before transport to MRI.  Patient transported to and from MRI without incident.  RT suctioned patient again upon returning to [place].  Patient's vital signs remained stable throughout.

## 2018-03-27 NOTE — Progress Notes (Signed)
ANTICOAGULATION CONSULT NOTE - Follow Up Consult  Pharmacy Consult for Heparin Indication: atrial fibrillation  Allergies  Allergen Reactions  . Asa [Aspirin] Anaphylaxis and Swelling    Throat swells   . Demerol [Meperidine Hcl] Shortness Of Breath, Swelling and Other (See Comments)    "Suppresses vagus nerve" (occurred during a procedure)    Patient Measurements: Height: 6\' 2"  (188 cm) Weight: 220 lb (99.8 kg) IBW/kg (Calculated) : 82.2 Heparin Dosing Weight: 99  Vital Signs: Temp: 99.7 F (37.6 C) (10/03 0800) Temp Source: Bladder (10/03 0800) BP: 105/58 (10/03 0800) Pulse Rate: 73 (10/03 0800)  Labs: Recent Labs    04/13/2018 1610 03/25/2018 1707 03/25/2018 2221 03/27/18 0410 03/27/18 0617  HGB 10.2* 11.2*  --  11.0*  --   HCT 32.4* 33.0*  --  33.8*  --   PLT 214  --   --  196  --   APTT  --   --   --   --  98*  LABPROT 22.6*  --   --   --   --   INR 2.01  --   --   --   --   HEPARINUNFRC  --   --   --   --  >2.20*  CREATININE 1.11 1.10 0.92 0.91  --   TROPONINI 0.03*  --  0.10* 0.09*  --     Estimated Creatinine Clearance: 76.2 mL/min (by C-G formula based on SCr of 0.91 mg/dL).  Assessment: 84 yo M admitted s/p cardiac arrest. Pt was on Eliquis PTA for Afib and started on IV heparin while in ICU. Per med rec, last dose of Eliquis was PTA on 10/2 at 0500. aPTT is within goal range (aPTT 98) and HL (>2.20) is falsely elevated. Will continue to dose heparin off aPTT since HL and aPTT are not correlating.  RN reports pt is bleeding from subglottic line and has pink-tinted urine. Will continue to monitor for further s/sxs bleeding. CBC stable and no infusion issues per nursing.  Goal of Therapy:  Heparin level 0.3-0.7 units/ml  APTT 66-102 seconds Monitor platelets by anticoagulation protocol: Yes   Plan:  -Continue heparin 1400 units/hr -Check 8h heparin level and aPTT -Monitor s/sxs bleeding  Richardine Service, PharmD Candidate 03/27/2018,8:20 AM

## 2018-03-27 NOTE — Consult Note (Signed)
[place] Nurse wound consult note Reason for Consult: Stage 2 pressure injury Wound type: Stage 3 pressure injury, full thickness He is noted to have chronic venous stasis LE skin changes with hyperkeratotic skin along the pretibial region Pressure Injury POA: Yes Measurement: 1.0cm x 0.7cm x 0.3cm  Wound bed: clean, pink, moist, non healing Drainage (amount, consistency, odor) none Periwound: intact, epibole of the wound edges Dressing procedure/placement/frequency:   Continue silicone foam, no depth enough to need packing.  On Progressa ICU bed No feeds yet in place  [place] Nurse ostomy consult note Stoma type/location: LLQ, end colostomy, not sure how long this has been present. No family at the bedside and patient is intubated Stomal assessment/size:aprx. 1 3/4" round, budded, pink Peristomal assessment: NA Treatment options for stomal/peristomal skin: NA Output yellow/brown stool, flatus Ostomy pouching: 2pc. In place from home  Will order supplies for bedside nurses to change 2x wk, or less based on patient preference.   Discussed POC with patient and bedside nurse.  Re consult if needed, will not follow at this time. Thanks  Jabri Blancett [employer], RN,CWOCN, CNS, [name] [phone])

## 2018-03-28 ENCOUNTER — Encounter (HOSPITAL_COMMUNITY): Payer: Self-pay | Admitting: *Deleted

## 2018-03-28 ENCOUNTER — Inpatient Hospital Stay (HOSPITAL_COMMUNITY): Payer: Medicare Other

## 2018-03-28 ENCOUNTER — Other Ambulatory Visit: Payer: Self-pay

## 2018-03-28 DIAGNOSIS — J810 Acute pulmonary edema: Secondary | ICD-10-CM

## 2018-03-28 LAB — GLUCOSE, CAPILLARY
GLUCOSE-CAPILLARY: 138 mg/dL — AB (ref 70–99)
GLUCOSE-CAPILLARY: 92 mg/dL (ref 70–99)
Glucose-Capillary: 97 mg/dL (ref 70–99)
Glucose-Capillary: 93 mg/dL (ref 70–99)
Glucose-Capillary: 107 mg/dL — ABNORMAL HIGH (ref 70–99)
Glucose-Capillary: 126 mg/dL — ABNORMAL HIGH (ref 70–99)

## 2018-03-28 LAB — BASIC METABOLIC PANEL
Anion gap: 11 (ref 5–15)
BUN: 13 mg/dL (ref 8–23)
CO2: 28 mmol/L (ref 22–32)
Calcium: 8.1 mg/dL — ABNORMAL LOW (ref 8.9–10.3)
Chloride: 92 mmol/L — ABNORMAL LOW (ref 98–111)
Creatinine, Ser: 0.96 mg/dL (ref 0.61–1.24)
GFR calc Af Amer: >60 mL/min (ref >60)
GFR calc non Af Amer: >60 mL/min (ref >60)
Glucose, Bld: 95 mg/dL (ref 70–99)
POTASSIUM: 3.4 mmol/L — AB (ref 3.5–5.1)
SODIUM: 131 mmol/L — AB (ref 135–145)

## 2018-03-28 LAB — HEPARIN LEVEL (UNFRACTIONATED): Heparin Unfractionated: 1.58 IU/mL — ABNORMAL HIGH (ref 0.30–0.70)

## 2018-03-28 LAB — CBC WITH DIFFERENTIAL/PLATELET
ABS IMMATURE GRANULOCYTES: 0 10*3/uL (ref 0.0–0.1)
Basophils Absolute: 0 10*3/uL (ref 0.0–0.1)
Basophils Relative: 0 %
Eosinophils Absolute: 0.1 10*3/uL (ref 0.0–0.7)
Eosinophils Relative: 1 %
HCT: 28.9 % — ABNORMAL LOW (ref 39.0–52.0)
HEMOGLOBIN: 9.4 g/dL — AB (ref 13.0–17.0)
Immature Granulocytes: 0 %
Lymphocytes Relative: 9 %
Lymphs Abs: 1.2 10*3/uL (ref 0.7–4.0)
MCH: 29.4 pg (ref 26.0–34.0)
MCHC: 32.5 g/dL (ref 30.0–36.0)
MCV: 90.3 fL (ref 78.0–100.0)
MONO ABS: 1 10*3/uL (ref 0.1–1.0)
Monocytes Relative: 8 %
NEUTROS ABS: 10.1 10*3/uL — AB (ref 1.7–7.7)
Neutrophils Relative %: 82 %
PLATELETS: 189 10*3/uL (ref 150–400)
RBC: 3.2 MIL/uL — ABNORMAL LOW (ref 4.22–5.81)
RDW: 14.6 % (ref 11.5–15.5)
WBC: 12.4 10*3/uL — ABNORMAL HIGH (ref 4.0–10.5)

## 2018-03-28 LAB — APTT
APTT: 88 s — AB (ref 24–36)
APTT: 99 s — AB (ref 24–36)
aPTT: 116 seconds — ABNORMAL HIGH (ref 24–36)
aPTT: 60 seconds — ABNORMAL HIGH (ref 24–36)

## 2018-03-28 LAB — MAGNESIUM: MAGNESIUM: 1.8 mg/dL (ref 1.7–2.4)

## 2018-03-28 LAB — PHOSPHORUS: Phosphorus: 3.1 mg/dL (ref 2.5–4.6)

## 2018-03-28 MED ORDER — VANCOMYCIN HCL 10 G IV SOLR
1250.0000 mg | INTRAVENOUS | Status: DC
  Administered 2018-03-29: 1250 mg via INTRAVENOUS
  Filled 2018-03-28 (×2): qty 1250

## 2018-03-28 MED ORDER — CHLORHEXIDINE GLUCONATE 0.12 % MT SOLN
15.0000 mL | Freq: Two times a day (BID) | OROMUCOSAL | Status: DC
  Administered 2018-03-28 – 2018-04-05 (×15): 15 mL via OROMUCOSAL
  Filled 2018-03-28 (×15): qty 15

## 2018-03-28 MED ORDER — POTASSIUM CHLORIDE 20 MEQ/15ML (10%) PO SOLN
40.0000 meq | Freq: Once | ORAL | Status: AC
  Administered 2018-03-28: 40 meq
  Filled 2018-03-28: qty 30

## 2018-03-28 MED ORDER — VANCOMYCIN HCL 10 G IV SOLR
2000.0000 mg | Freq: Once | INTRAVENOUS | Status: AC
  Administered 2018-03-28: 2000 mg via INTRAVENOUS
  Filled 2018-03-28: qty 2000

## 2018-03-28 MED ORDER — FUROSEMIDE 10 MG/ML IJ SOLN
40.0000 mg | Freq: Once | INTRAMUSCULAR | Status: AC
  Administered 2018-03-28: 40 mg via INTRAVENOUS
  Filled 2018-03-28: qty 4

## 2018-03-28 MED ORDER — MAGNESIUM SULFATE IN D5W 1-5 GM/100ML-% IV SOLN
1.0000 g | Freq: Once | INTRAVENOUS | Status: AC
  Administered 2018-03-28: 1 g via INTRAVENOUS
  Filled 2018-03-28: qty 100

## 2018-03-28 MED ORDER — BISACODYL 5 MG PO TBEC: 5.0000 mg | DELAYED_RELEASE_TABLET | Freq: Every day | ORAL | Status: DC | PRN

## 2018-03-28 MED ORDER — ORAL CARE MOUTH RINSE
15.0000 mL | Freq: Two times a day (BID) | OROMUCOSAL | Status: DC
  Administered 2018-03-28 – 2018-04-04 (×12): 15 mL via OROMUCOSAL

## 2018-03-28 NOTE — Progress Notes (Signed)
ANTICOAGULATION CONSULT NOTE - Follow Up Consult  Pharmacy Consult for Heparin Indication: atrial fibrillation  Allergies  Allergen Reactions  . Asa [Aspirin] Anaphylaxis and Swelling    Throat swells   . Demerol [Meperidine Hcl] Shortness Of Breath, Swelling and Other (See Comments)    "Suppresses vagus nerve" (occurred during a procedure)    Patient Measurements: Height: 5\' 9"  (175.3 cm) Weight: 250 lb 14.1 oz (113.8 kg) IBW/kg (Calculated) : 70.7 Heparin Dosing Weight: 99  Vital Signs: Temp: 99.3 F (37.4 C) (10/04 1200) Temp Source: Bladder (10/04 1200) BP: 123/61 (10/04 1400) Pulse Rate: 85 (10/04 1400)  Labs: Recent Labs    03/25/2018 1610 04/21/2018 1707 03/28/2018 2221 03/27/18 0410 03/27/18 0617 03/27/18 1028  03/27/18 2342 03/28/18 0352 03/28/18 0353 03/28/18 1256  HGB 10.2* 11.2*  --  11.0*  --   --   --   --  9.4*  --   --   HCT 32.4* 33.0*  --  33.8*  --   --   --   --  28.9*  --   --   PLT 214  --   --  196  --   --   --   --  189  --   --   APTT  --   --   --   --  98*  --    < > 99* 116*  --  60*  LABPROT 22.6*  --   --   --   --   --   --   --   --   --   --   INR 2.01  --   --   --   --   --   --   --   --   --   --   HEPARINUNFRC  --   --   --   --  >2.20*  --   --   --   --  1.58*  --   CREATININE 1.11 1.10 0.92 0.91  --   --   --   --  0.96  --   --   TROPONINI 0.03*  --  0.10* 0.09*  --  0.06*  --   --   --   --   --    < > = values in this interval not displayed.    Estimated Creatinine Clearance: 71.2 mL/min (by C-G formula based on SCr of 0.96 mg/dL).   Assessment: 84 yo M admitted s/p cardiac arrest. Pt was on Eliquis PTA for Afib and started on IV heparin while in ICU. Last dose of Eliquis PTA was 10/2 at 0500 per med rec. Continuing to dose based on APTT as HL was still falsely elevated this AM due to Eliquis.  APTT (60 sec) subtherapeutic on 1000 units/hr. Recently decreased heparin this AM as APTT (116) was supratherapeutic on 1200  units/hr. CBC stable. No signs of bleeding or infusion issues per nursing.   Goal of Therapy:  Heparin level 0.3-0.7 units/ml Monitor platelets by anticoagulation protocol: Yes   Plan:  - Increase Heparin 1100 units/hr - Monitor 8hr aPTT and daily HL - Monitor bleeding  Richardine Service, PharmD Candidate 03/28/2018,2:12 PM

## 2018-03-28 NOTE — Evaluation (Signed)
Clinical/Bedside Swallow Evaluation Patient Details  Name: Terry Wu MRN: [id number] Date of Birth: [birth year]  Today's Date: 03/28/2018 Time: SLP Start Time (ACUTE ONLY): 7829 SLP Stop Time (ACUTE ONLY): 1540 SLP Time Calculation (min) (ACUTE ONLY): 19 min  Past Medical History:  Past Medical History:  Diagnosis Date  . Cancer ([place])   . COPD (chronic obstructive pulmonary disease) ([place])   . Diabetes mellitus without complication ([place])   . Hypertension    Past Surgical History:  Past Surgical History:  Procedure Laterality Date  . COLON SURGERY    . CORONARY ARTERY BYPASS GRAFT     HPI:   84 year old with pmh of aortic aneurysm, chronic atrial fibrillation on Eliquis, CAD s/p CABG/AVR 2006, HTN, type 2 DM, HLD, prostate cancer with radioactive seed implant, colon cancer s/p resection and colostomy 02/07/2017 with post-op CVA, COPD presenting from home after witnessed unresponsiveness. 5 mins later on EMS arrival found pulseless and ROSC obtained after 5 mins without defib or meds.  Required intubation for airway protection with some purposeful movement in ER.     Assessment / Plan / Recommendation Clinical Impression   Pt presents with hoarse vocal quality post extubation but voice and cough were very strong and pt was able to elicit a volitional swallow without difficulty.  O2 sats were WFL on 1 L via nasal cannula and respirations were even if not slightly labored.  Pt's oral phase was efficient for containment, transit and clearance of boluses and swallow response appeared swift and timely.  Pt did exhibit multiple swallows when challenged to take consecutive boluses of thin liquids in rapid succession but had no other s/s of aspiration or airway compromise.  Pt did have coughing episode with expectoration of thin, loose secretions well after eating and drinking had ceased which did not appear related to PO intake.  Wife reports "loose" cough since extubation this morning.  Wife  reports that at baseline she chopped up pt's meats to prevent choking but denies any choking episodes or any other swallowing difficulty other than following post op CVA last year.  As a result, recommend initiating a diet of dys 3 textures and thin liquids with full supervision, meds whole in puree as this most closely resembles his baseline diet.  ST will continue to follow for toleration of diet initiation.   SLP Visit Diagnosis: Dysphagia, unspecified (R13.10)    Aspiration Risk  Mild aspiration risk;Moderate aspiration risk    Diet Recommendation Dysphagia 3 (Mech soft);Thin liquid   Liquid Administration via: Cup Medication Administration: Whole meds with puree Supervision: Patient able to self feed;Full supervision/cueing for compensatory strategies Compensations: Minimize environmental distractions;Slow rate;Small sips/bites Postural Changes: Seated upright at 90 degrees;Remain upright for at least 30 minutes after po intake    Other  Recommendations Oral Care Recommendations: Oral care BID   Follow up Recommendations Other (comment)(TBD pending progress made while inpatient)      Frequency and Duration min 2x/week          Prognosis Prognosis for Safe Diet Advancement: Good      Swallow Study   General Date of Onset: (see HPI) HPI:  84 year old with pmh of aortic aneurysm, chronic atrial fibrillation on Eliquis, CAD s/p CABG/AVR 2006, HTN, type 2 DM, HLD, prostate cancer with radioactive seed implant, colon cancer s/p resection and colostomy 02/07/2017 with post-op CVA, COPD presenting from home after witnessed unresponsiveness. 5 mins later on EMS arrival found pulseless and ROSC obtained after 5 mins  without defib or meds.  Required intubation for airway protection with some purposeful movement in ER.   Type of Study: Bedside Swallow Evaluation Previous Swallow Assessment: none found on record; however, wife reports pt being followed for swallowing following post op CVA last  year.  Diet Prior to this Study: NPO Temperature Spikes Noted: Yes(low grade temp) Respiratory Status: Nasal cannula(1 L) History of Recent Intubation: Yes Length of Intubations (days): 3 days Date extubated: 03/28/18 Behavior/Cognition: Alert;Cooperative;Requires cueing Oral Cavity Assessment: Dry Oral Care Completed by SLP: Yes Oral Cavity - Dentition: Poor condition;Adequate natural dentition Vision: Functional for self-feeding Self-Feeding Abilities: Able to feed self Patient Positioning: Upright in chair Baseline Vocal Quality: Hoarse Volitional Cough: Strong Volitional Swallow: Able to elicit    Oral/Motor/Sensory Function Overall Oral Motor/Sensory Function: Within functional limits   Ice Chips     Thin Liquid Thin Liquid: Impaired Pharyngeal  Phase Impairments: Multiple swallows    Nectar Thick     Honey Thick     Puree Puree: Within functional limits   Solid     Solid: Within functional limits      Saidee Geremia, Elmyra Ricks L 03/28/2018,3:53 PM

## 2018-03-28 NOTE — Progress Notes (Signed)
Pharmacy Antibiotic Note  Terry Wu is a 84 y.o. male admitted on 04/20/2018 s/p cardiac arrest.  Pharmacy has been consulted for Vancomycin dosing due to staph aureus growing in tracheal aspirate.  Patient intubated on 10/2 and extubated this AM (10/4). Pt remains afebrile with elevated WBC that has been trending down to 12.4. Renal function stable. CXR shows stable bibasilar haziness.  Plan: - Initiate Vancomycin 2000 mg IV loading dose once, then Vancomycin 1250 mg IV every 24 hours.  Goal trough 15-20 mcg/mL. - Monitor WBC, temp, signs of infection, renal function - F/u cultures and de-escalation of antibiotics  Height: 5\' 9"  (175.3 cm) Weight: 250 lb 14.1 oz (113.8 kg) IBW/kg (Calculated) : 70.7  Temp (24hrs), Avg:99.7 F (37.6 C), Min:99.1 F (37.3 C), Max:100.2 F (37.9 C)  Recent Labs  Lab 04/15/2018 1610 04/10/2018 1707 03/27/2018 1809 04/23/2018 2221 03/27/18 0410 03/28/18 0352  WBC 11.2*  --   --   --  14.1* 12.4*  CREATININE 1.11 1.10  --  0.92 0.91 0.96  LATICACIDVEN  --  3.43* 1.81  --   --   --     Estimated Creatinine Clearance: 71.2 mL/min (by C-G formula based on SCr of 0.96 mg/dL).    Allergies  Allergen Reactions  . Asa [Aspirin] Anaphylaxis and Swelling    Throat swells   . Demerol [Meperidine Hcl] Shortness Of Breath, Swelling and Other (See Comments)    "Suppresses vagus nerve" (occurred during a procedure)    Antimicrobials this admission: None  Dose adjustments this admission: N/A  Microbiology results: 10/3 Tracheal Aspirate Cx: few staph aureus, susceptibilities pending 10/2 BCx: NGTD 10/2 MRSA PCR: neg    Richardine Service, PharmD Candidate 03/28/2018 11:21 AM

## 2018-03-28 NOTE — Care Management Note (Addendum)
Case Management Note  Patient Details  Name: RAYEN DAFOE MRN: [id number] Date of Birth: 18-Jun-1932  Subjective/Objective: 84 yo male presented with a cardiac arrest. PMHx: Aortic aneurysm, chronic Afib, CAD s/p CABG/AVR 2006, HTN, type 2 DM, HLD, prostate cancer with radioactive seed implant, colon cancer s/p resection and colostomy 02/07/2017 with post-op CVA, COPD                  Action/Plan: CM met with patient/spouse and daughter to discuss transitional care needs. Patient lived at home with spouse, min assist with ADLs was provided by spouse, with patient ambulating with a RW and has a transport WC. Spouse indicated patient's PCP as: Dr. Sinda Du; Ostomy supplies are provided by [employer]; AARP for [employer], with no Medicare Part D. CM discussed PT recommendations for HHPT and 3n1, with spouse agreeable to [facility] services, but doesn't feel patient would benefit from a 3n1 BSC d/t having an ostomy; OT eval still pending, awaiting transitional recommendations. HHPT referral given to [facility] liaison; AVS updated. Patient will need HH orders and F2F to arrange services. Physical Address: [address] [place] [address]. Spouse verbalized she can provide transportation home and 24hr assistance. CM will continue to follow.   Expected Discharge Date:                  Expected Discharge Plan:  [place]  In-House Referral:  NA  Discharge planning Services  CM Consult  Post Acute Care Choice:  Home Health Choice offered to:  Spouse  DME Arranged:  N/A DME Agency:  NA  HH Arranged:  PT HH Agency:  [facility]  Status of Service:  In process, will continue to follow  If discussed at Long Length of Stay Meetings, dates discussed:    Additional Comments:  Midge Minium RN, BSN, NCM-BC, ACM-RN [phone] 03/28/2018, 3:32 PM

## 2018-03-28 NOTE — Progress Notes (Signed)
ANTICOAGULATION CONSULT NOTE - Follow Up Consult  Pharmacy Consult for Heparin Indication: atrial fibrillation  Allergies  Allergen Reactions  . Asa [Aspirin] Anaphylaxis and Swelling    Throat swells   . Demerol [Meperidine Hcl] Shortness Of Breath, Swelling and Other (See Comments)    "Suppresses vagus nerve" (occurred during a procedure)    Patient Measurements: Height: 5\' 9"  (175.3 cm) Weight: 250 lb 14.1 oz (113.8 kg) IBW/kg (Calculated) : 70.7 Heparin Dosing Weight: 99  Vital Signs: Temp: 97.7 F (36.5 C) (10/04 2000) Temp Source: Oral (10/04 2000) BP: 138/90 (10/04 2000) Pulse Rate: 85 (10/04 2000)  Labs: Recent Labs    04/17/2018 1610 04/13/2018 1707 04/04/2018 2221 03/27/18 0410 03/27/18 0617 03/27/18 1028  03/28/18 0352 03/28/18 0353 03/28/18 1256 03/28/18 2237  HGB 10.2* 11.2*  --  11.0*  --   --   --  9.4*  --   --   --   HCT 32.4* 33.0*  --  33.8*  --   --   --  28.9*  --   --   --   PLT 214  --   --  196  --   --   --  189  --   --   --   APTT  --   --   --   --  98*  --    < > 116*  --  60* 88*  LABPROT 22.6*  --   --   --   --   --   --   --   --   --   --   INR 2.01  --   --   --   --   --   --   --   --   --   --   HEPARINUNFRC  --   --   --   --  >2.20*  --   --   --  1.58*  --   --   CREATININE 1.11 1.10 0.92 0.91  --   --   --  0.96  --   --   --   TROPONINI 0.03*  --  0.10* 0.09*  --  0.06*  --   --   --   --   --    < > = values in this interval not displayed.    Estimated Creatinine Clearance: 71.2 mL/min (by C-G formula based on SCr of 0.96 mg/dL).   Assessment: 84 yo M admitted s/p cardiac arrest. Pt was on Eliquis PTA for Afib and started on IV heparin while in ICU. Last dose of Eliquis PTA was 10/2 at 0500 per med rec. Continuing to dose based on APTT as HL was still falsely elevated this AM due to Eliquis.  APTT (88sec) therapeutic on 1100 units/hr. No bleeding noted.  Goal of Therapy:  Heparin level 0.3-0.7 units/ml Monitor  platelets by anticoagulation protocol: Yes   Plan:  - Continue Heparin 1100 units/hr - Monitor daily aPTT and HL - Monitor bleeding  Sherlon Handing, PharmD, BCPS Clinical pharmacist  **Pharmacist phone directory can now be found on [email or web address] ([email or web address]).  Listed under [place]. 03/28/2018,11:35 PM

## 2018-03-28 NOTE — Progress Notes (Addendum)
NAME:  Terry Wu, MRN:  [id number], DOB:  1932-01-04, LOS: 2 ADMISSION DATE:  04/06/2018, CONSULTATION DATE:  04/15/2018 REFERRING MD:  Dr. Penni Bombard , CHIEF COMPLAINT:  Cardiac arrest   Brief History   84 year old presenting from home.  Witnessed unresponsiveness. 5 mins later on EMS arrival found pulseless and ROSC obtained after 5 mins without defib or meds.  Required intubation for airway protection with some purposeful movement in ER.  CT cervical and CTH negative.  EKG non acute.  Past Medical History  Aortic aneurysm, chronic atrial fibrillation on Eliquis, CAD s/p CABG/AVR 2006, HTN, type 2 DM, HLD, prostate cancer with radioactive seed implant, colon cancer s/p resection and colostomy 02/07/2017 with post-op CVA, COPD  Significant Hospital Events   10/2 Admit  Consults: date of consult/date signed off & final recs:  10/2 PCCM   Procedures (surgical and bedside):  10/2 ETT >>  Significant Diagnostic Tests:  04/16/2018 CT cervical & head >> 1. No acute intracranial abnormalities. 2. No fracture or traumatic malalignment cervical spine. Multilevel degenerative changes. 3. Sinus disease as above.  10/2 CXR >> 1. Endotracheal tube tip about 3.8 cm superior to the carina.  Esophageal tube tip below the diaphragm but non included. 2. Cardiomegaly with vascular congestion  Micro Data:  10/2 BC >> 10/2 MRSA PCR >>  Antimicrobials:  none  Subjective:  Sedation off  Objective   Blood pressure 110/64, pulse 86, temperature 99.3 F (37.4 C), resp. rate (!) 21, height 5\' 9"  (1.753 m), weight 113.8 kg, SpO2 97 %.    Vent Mode: CPAP;PSV FiO2 (%):  [30 %] 30 % Set Rate:  [18 bmp] 18 bmp Vt Set:  [550 mL] 550 mL PEEP:  [5 cmH20] 5 cmH20 Pressure Support:  [5 cmH20-10 cmH20] 10 cmH20 Plateau Pressure:  [12 cmH20-22 cmH20] 22 cmH20   Intake/Output Summary (Last 24 hours) at 03/28/2018 0925 Last data filed at 03/28/2018 0900 Gross per 24 hour  Intake 1125.79 ml  Output 1510  ml  Net -384.21 ml   Filed Weights   04/07/2018 1700 03/27/18 1622 03/28/18 0427  Weight: 99.8 kg 115.3 kg 113.8 kg   Examination: General: Elderly male supine in bed, awake and alert off sedation , following commands HEENT: Tracheal tube in place orogastric tube in place, NCAT, Thick neck, No LAD Neuro: Awake, alert, lip speaking, following commands CV: S1, S2, irr RR, No RMG PULM:Bilateral excursion,  even/non-labored, diminished per bases GI: Colostomy noted no acute issue, Stoma pink, ND, NT, Obese Extremities: warm/dry, 1+ edema with dermal wrinkles, overgrown toe nails Skin: no rashes or lesions, warm, dry and intact   Resolved Hospital Problem list    Assessment & Plan:  Cardiac Arrest No pressor needs Currently awake and alert, following commands Did not require medications to restore ROSC Troponin still elevated but down trending Lactate WNL 10/2 + 1.4 L since admit, net negative 700 last 24 hours Plan  Tele monitoring>> atrial fib rate conmtrolled Trend troponins until clear EKG prn Map goal > 65   Acute respiratory insufficiency in the setting of cardiac arrest COPD- no home nebs, never smoker - 10/3 CXR ETT/ OGT good position, wide mediastinum, possible RUL opacity, LLL atelectasis  10/4 : Stable vascular congestion and bibasilar haziness as described. Personally reviewed Weaning on 5/5 P:  Plan for extubation 10/4 CXR 10/5 and prn Lasix 40 mg x 1 Sat goal 88-92% Aggressive pulmonary toilet OOB to chair IS Q 1 while awake as tolerated Pt.  Is a LCB as of 10/3 2019 No re-intubation for airway protection issue, but would re-tube for pulmonary edema  Bradycardic Afib  - on Eliquis - A fib rate controlled 85 per tele P:  Off propofol Off dopamine EKG prn Monitor for further episodes   Leukocytosis Few Staph Aureus per Respiratory culture  10/3 Low grade temp P:  Trend fever, WBC Vanc per pharmacy Check PCT  Follow cultures for  sensitivities Re-culture as is clinically indicated     DM CBG (last 3)  Recent Labs    03/27/18 2355 03/28/18 0336 03/28/18 0820  GLUCAP 92 97 93    P:  CBG Q 4 Sliding scale   HTN, diastolic HF, CAD s/p CABG/AVR  - prior EF 55-60 (01/2017) - Net negative 1.1 L P:  Lasix 40 mg x 1 Hold hypertensive medication  Normocytic anemia - no signs of bleeding, on Eliquis - normal coags  P:  Transfuse per protocol  Hyponatremia- chronic Recent Labs  Lab 04/13/2018 2221 03/27/18 0410 03/28/18 0352  NA 130* 129* 131*    P:  Mag and potassium repleted 10/4 Monitor electrolytes, daily weights, intake and output  Hyperkalemia  Recent Labs  Lab 03/25/2018 2221 03/27/18 0410 03/28/18 0352  K 4.2 4.1 3.4*    - normal sCr P:  Monitor electrolytes Replete as needed   Disposition / Summary of Today's Plan 03/28/18   Continue mechanical ventilatory support assess for extubation.  Family discussing questionable no reintubation if extubated. Stabilize hemodynamically Change to a limited CODE BLUE no shock no CPR Assess labs in a.m.    Diet: NPO Pain/Anxiety/Delirium protocol (if indicated): hold for now to assess mental status  VAP protocol (if indicated): yes DVT prophylaxis: heparin gtt GI prophylaxis: protonix PO Hyperglycemia protocol: SSI sensitive Mobility: bedrest Code Status: 03/27/2018 changed to limited CODE BLUE no shock no CPR Family Communication: Updated wife daughter at bedside.  Have requested limited CODE BLUE status  Labs   CBC: Recent Labs  Lab 04/01/2018 1610 03/29/2018 1707 03/27/18 0410 03/28/18 0352  WBC 11.2*  --  14.1* 12.4*  NEUTROABS 7.6  --   --  10.1*  HGB 10.2* 11.2* 11.0* 9.4*  HCT 32.4* 33.0* 33.8* 28.9*  MCV 93.9  --  90.1 90.3  PLT 214  --  196 938    Basic Metabolic Panel: Recent Labs  Lab 04/20/2018 1610 03/29/2018 1707 04/16/2018 2221 03/27/18 0410 03/28/18 0352  NA 126* 124* 130* 129* 131*  K 6.1* 5.9* 4.2 4.1 3.4*   CL 89* 89* 89* 90* 92*  CO2 27  --  31 28 28   GLUCOSE 196* 196* 134* 90 95  BUN 16 20 16 16 13   CREATININE 1.11 1.10 0.92 0.91 0.96  CALCIUM 8.6*  --  9.0 8.5* 8.1*  MG  --   --  2.0 1.9 1.8  PHOS  --   --  3.5 3.3 3.1   GFR: Estimated Creatinine Clearance: 71.2 mL/min (by C-G formula based on SCr of 0.96 mg/dL). Recent Labs  Lab 04/10/2018 1610 04/20/2018 1707 04/13/2018 1809 04/19/2018 2221 03/27/18 0410 03/28/18 0352  PROCALCITON  --   --   --  <0.10 0.11  --   WBC 11.2*  --   --   --  14.1* 12.4*  LATICACIDVEN  --  3.43* 1.81  --   --   --     Liver Function Tests: Recent Labs  Lab 04/21/2018 1610 03/25/2018 2221 03/27/18 0410  AST 58*  --   --  ALT 50*  --   --   ALKPHOS 37*  --   --   BILITOT 0.9  --   --   PROT 6.6  --   --   ALBUMIN 2.7* 2.8* 2.7*   No results for input(s): LIPASE, AMYLASE in the last 168 hours. Recent Labs  Lab 03/27/18 0617  AMMONIA 35    ABG    Component Value Date/Time   PHART 7.410 04/02/2018 2130   PCO2ART 49.7 (H) 03/25/2018 2130   PO2ART 306 (H) 04/24/2018 2130   HCO3 31.2 (H) 04/02/2018 2130   TCO2 32 04/09/2018 1707   O2SAT 99.7 03/25/2018 2130     Coagulation Profile: Recent Labs  Lab 04/12/2018 1610  INR 2.01    Cardiac Enzymes: Recent Labs  Lab 04/04/2018 1610 04/23/2018 2221 03/27/18 0410 03/27/18 1028  TROPONINI 0.03* 0.10* 0.09* 0.06*    HbA1C: No results found for: HGBA1C  CBG: Recent Labs  Lab 03/27/18 1625 03/27/18 2018 03/27/18 2355 03/28/18 0336 03/28/18 0820  GLUCAP 76 78 92 97 93    Admitting History of Present Illness.   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  Wife reports patient was in his usual state of health.  Bent over and complained of mild shortness of breath and dizziness and then slumped over in the chair unresponsive.  By the time family assisted him to the floor, EMS arrived (~5 mins) and patient found pulseless.  CPR was given with ROSC after 5 mins.  No defib or medications given.  Terry airway placed and patient having purposeful movement on arrival to ER.  EKG on arrival to ER afib, rate 81 without ST elevation or depression.  Labs noted for Na 124, K 5.9, glucose 196, Lactic 3.43 ->1.81, Hgb 10.2, UA neg, UDS neg, trop 0.03, INR 2.01, PT 22.6, BNP 432.3.  Terry airway exchanged for ETT and placed on propofol for sedation where he became bradycardic and hypotensive.  PCCM called for admission.    Review of Systems:   Unable to obtain as patient is intubated on mechanical ventilation.   Past Medical History  He,  has no past medical history on file.  Of note, there are 2 charts.  See chart w/ MRN [id number]  Surgical History   Of note, there are 2 charts.  See chart w/ MRN [id number] and HPI for further history.  Social History   Social History   Socioeconomic History  . Marital status: Married    Spouse name: Not on file  . Number of children: Not on file  . Years of education: Not on file  . Highest education level: Not on file  Occupational History  . Not on file  Social Needs  . Financial resource strain: Not on file  . Food insecurity:    Worry: Not on file    Inability: Not on file  . Transportation needs:    Medical: Not on file    Non-medical: Not on file  Tobacco Use  . Smoking status: Not on file  Substance and Sexual Activity  . Alcohol use: Not on file  . Drug use: Not on file  . Sexual activity:  Not on file  Lifestyle  . Physical activity:    Days per week: Not on file    Minutes per session: Not on file  . Stress: Not on file  Relationships  . Social  connections:    Talks on phone: Not on file    Gets together: Not on file    Attends religious service: Not on file    Active member of club or organization: Not on file    Attends meetings of clubs or organizations: Not on file    Relationship status: Not on file  . Intimate partner violence:    Fear of current or ex partner: Not on file    Emotionally abused: Not on file    Physically abused: Not on file    Forced sexual activity: Not on file  Other Topics Concern  . Not on file  Social History Narrative  . Not on file  ,     Family History   His family history is not on file.   Allergies Allergies  Allergen Reactions  . Asa [Aspirin] Anaphylaxis and Swelling    Throat swells   . Demerol [Meperidine Hcl] Shortness Of Breath, Swelling and Other (See Comments)    "Suppresses vagus nerve" (occurred during a procedure)     Home Medications  Prior to Admission medications   Medication Sig Start Date End Date Taking? Authorizing Provider  amoxicillin (AMOXIL) 500 MG capsule Take 2,000 mg by mouth See admin instructions. Take 2,000 mg by mouth one hour prior to procedure(s), then as directed 01/18/18  Yes [provider]  atorvastatin (LIPITOR) 20 MG tablet Take 20 mg by mouth every evening.  01/04/18  Yes [provider]  carvedilol (COREG) 3.125 MG tablet Take 3.125 mg by mouth 2 (two) times daily with a meal. 03/24/18  Yes [provider]  ELIQUIS 5 MG TABS tablet Take 5 mg by mouth 2 (two) times daily. 03/07/18  Yes [provider]  furosemide (LASIX) 40 MG tablet Take 40 mg by mouth 2 (two) times daily. 12/24/17  Yes [provider]  glimepiride (AMARYL) 2 MG tablet Take 1 mg by mouth daily with breakfast.   Yes [provider]  Multiple Vitamins-Minerals (PRESERVISION AREDS 2+MULTI VIT) CAPS Take 1 capsule by mouth 2 (two) times daily.   Yes [provider]  pantoprazole (PROTONIX) 40 MG tablet Take 40 mg by mouth  daily before lunch.  02/12/18  Yes [provider]  potassium chloride (K-DUR,KLOR-CON) 10 MEQ tablet Take 10 mEq by mouth 2 (two) times daily.   Yes [provider]  timolol (TIMOPTIC) 0.5 % ophthalmic solution Place 1 drop into both eyes 2 (two) times daily. 03/07/18  Yes [provider]      Magdalen Spatz, AGACNP-BC [place]. Pager: [phone] 03/28/2018 9:27 AM  Attending Note:  84 year old male with PMH above presenting to PCCM with respiratory failure s/p cardiac arrest.  On exam, patient is alert and oriented, following commands with clear lungs.  I reviewed CXR myself, pulmonary edema that is mild noted.  Discussed with PCCM-NP.  Cultures of the sputum with staph in them.  Will start vancomycin.  I spoke with the patient's family extensively, they are more interested in quality of life and would only reintubate if there is something that is reversible but if it is an airway compromise issue they would not want reintubation/trach...etc.  Will make patient a LCB with no CPR/cardioversion.  Will proceed  with extubation and monitor patient.  If stable will consider transfer out of the ICU in the afternoon.  The patient is critically ill with multiple organ systems failure and requires high complexity decision making for assessment and support, frequent evaluation and titration of therapies, application of advanced monitoring technologies and extensive interpretation of multiple databases.   Critical Care Time devoted to patient care services described in this note is  38  Minutes. This time reflects time of care of this signee Dr Jennet Maduro. This critical care time does not reflect procedure time, or teaching time or supervisory time of PA/NP/Med student/Med Resident etc but could involve care discussion time.  Rush Farmer, M.D. [facility]. Pager: [phone]. After hours pager: [phone].

## 2018-03-28 NOTE — Procedures (Signed)
Extubation Procedure Note  Patient Details:   Name: Terry Wu DOB: 03/06/1932 MRN: [id number]   Airway Documentation:  Airway (Active)  Secured at (cm) 23 cm 03/28/2018  8:23 AM  Measured From Lips 03/28/2018  8:23 AM  [place] 03/28/2018  8:23 AM  Secured By [employer] 03/28/2018  8:23 AM  Tube Holder Repositioned Yes 03/28/2018  8:23 AM  Cuff Pressure (cm H2O) 28 cm H2O 03/28/2018  3:14 AM  Site Condition Dry 03/28/2018  8:23 AM   Vent end date: (not recorded) Vent end time: (not recorded)   Evaluation  O2 sats: stable throughout Complications: No apparent complications Patient did tolerate procedure well. Bilateral Breath Sounds: Diminished   Yes   Patient extubated to [place]. Vital signs stable at this time. Patient tolerated well. RT will continue to monitor.  Mcneil Sober 03/28/2018, 10:44 AM

## 2018-03-28 NOTE — Progress Notes (Signed)
[facility] Progress Note Patient Name: Terry Wu DOB: 06-15-1932 MRN: [id number]   Date of Service  03/28/2018  HPI/Events of Note  K+ = 3.4, Mg++ = 1.8 and Creatinine = 0.96.   eICU Interventions  Will replace Mg++ and K+.     Intervention Category Major Interventions: Electrolyte abnormality - evaluation and management  Sommer,Steven Eugene 03/28/2018, 7:15 AM

## 2018-03-28 NOTE — Progress Notes (Signed)
Potassium 3.4 via labs. Notified [place] provider. Pt currently NPO, OG tube in place with 2 PIVs. Awaiting replacement orders.

## 2018-03-28 NOTE — Plan of Care (Signed)
  Problem: Education: Goal: Knowledge of General Education information will improve Description: Including pain rating scale, medication(s)/side effects and non-pharmacologic comfort measures Outcome: Progressing   Problem: Health Behavior/Discharge Planning: Goal: Ability to manage health-related needs will improve Outcome: Progressing   Problem: Clinical Measurements: Goal: Ability to maintain clinical measurements within normal limits will improve Outcome: Progressing Goal: Will remain free from infection Outcome: Progressing Goal: Diagnostic test results will improve Outcome: Progressing Goal: Respiratory complications will improve Outcome: Progressing Goal: Cardiovascular complication will be avoided Outcome: Progressing   Problem: Activity: Goal: Risk for activity intolerance will decrease Outcome: Progressing   Problem: Nutrition: Goal: Adequate nutrition will be maintained Outcome: Progressing   Problem: Coping: Goal: Level of anxiety will decrease Outcome: Progressing   Problem: Elimination: Goal: Will not experience complications related to bowel motility Outcome: Progressing Goal: Will not experience complications related to urinary retention Outcome: Progressing   Problem: Pain Managment: Goal: General experience of comfort will improve Outcome: Progressing   Problem: Safety: Goal: Ability to remain free from injury will improve Outcome: Progressing   Problem: Skin Integrity: Goal: Risk for impaired skin integrity will decrease Outcome: Progressing   Problem: Activity: Goal: Ability to tolerate increased activity will improve Outcome: Progressing   Problem: Respiratory: Goal: Ability to maintain a clear airway and adequate ventilation will improve Outcome: Progressing   

## 2018-03-28 NOTE — Progress Notes (Addendum)
ANTICOAGULATION CONSULT NOTE - Follow Up Consult  Pharmacy Consult for Heparin Indication: atrial fibrillation  Allergies  Allergen Reactions  . Asa [Aspirin] Anaphylaxis and Swelling    Throat swells   . Demerol [Meperidine Hcl] Shortness Of Breath, Swelling and Other (See Comments)    "Suppresses vagus nerve" (occurred during a procedure)    Patient Measurements: Height: 5\' 9"  (175.3 cm) Weight: 254 lb 3.1 oz (115.3 kg) IBW/kg (Calculated) : 70.7 Heparin Dosing Weight: 99  Vital Signs: Temp: 99.5 F (37.5 C) (10/04 0000) Temp Source: Bladder (10/04 0000) BP: 112/63 (10/04 0000) Pulse Rate: 77 (10/04 0000)  Labs: Recent Labs    04/02/2018 1610 04/24/2018 1707 03/25/2018 2221 03/27/18 0410 03/27/18 0617 03/27/18 1028 03/27/18 1350 03/27/18 2342  HGB 10.2* 11.2*  --  11.0*  --   --   --   --   HCT 32.4* 33.0*  --  33.8*  --   --   --   --   PLT 214  --   --  196  --   --   --   --   APTT  --   --   --   --  98*  --  115* 99*  LABPROT 22.6*  --   --   --   --   --   --   --   INR 2.01  --   --   --   --   --   --   --   HEPARINUNFRC  --   --   --   --  >2.20*  --   --   --   CREATININE 1.11 1.10 0.92 0.91  --   --   --   --   TROPONINI 0.03*  --  0.10* 0.09*  --  0.06*  --   --     Estimated Creatinine Clearance: 75.6 mL/min (by C-G formula based on SCr of 0.91 mg/dL).  Assessment: 84 yo M admitted s/p cardiac arrest. Pt was on Eliquis PTA for Afib and started on IV heparin while in ICU. Per med rec, last dose of Eliquis was PTA on 10/2 at 0500 - Eliquis still affecting heparin levels so utilizing PTT to dose heparin.  PTT = 99 sec (therapeutic) on gtt at 1200 units/hr. RN reports pink tinged fluid in ET tube. Urine has cleared up (was pink-tinted this a.m.) Will continue to monitor for further s/sxs bleeding. CBC stable and no infusion issues per nursing.  Goal of Therapy:  Heparin level 0.3-0.7 units/ml  APTT 66-102 seconds Monitor platelets by anticoagulation  protocol: Yes   Plan:  -Continue heparin 1200 units/hr -Daily PTT and heparin level -Monitor bleeding  Sherlon Handing, PharmD, BCPS Clinical pharmacist  **Pharmacist phone directory can now be found on [email or web address] ([email or web address]).  Listed under [place]. 03/28/2018,1:12 AM   Addendum (0500): PTT now elevated to 116 seconds (supratherapeutic).   Plan: Decrease heparin gtt to 1000 units/hr Will f/u 8 hr PTT  Sherlon Handing, PharmD, BCPS Clinical pharmacist  **Pharmacist phone directory can now be found on [email or web address] ([email or web address]).  Listed under [place]. 03/28/2018 4:58 AM

## 2018-03-28 NOTE — Evaluation (Signed)
Physical Therapy Evaluation Patient Details Name: Terry Wu MRN: [id number] DOB: [birth year] Today's Date: 03/28/2018   History of Present Illness  84 yo presenting as cardiac arrest at home s/p CPR and intubation 10/2-10/4. PMHx: Aortic aneurysm, chronic Afib, CAD s/p CABG/AVR 2006, HTN, type 2 DM, HLD, prostate cancer with radioactive seed implant, colon cancer s/p resection and colostomy 02/07/2017 with post-op CVA, COPD  Clinical Impression  Pt with flat affect on arrival, family present and able to assist with prior functioning. Pt wife reports pt has not been ambulating much in last 6 weeks after a fall, pt with increased fear of falling. Pt min assist for bed mobility, min assist +2 for transfers and min a for ambulation with RW and chair to follow. During ambulation HR spiked to 148, prompt return to seated and quick recovery to 111 <30 seconds. Pt with decreased strength, balance and activity tolerance (see PT problem list below for all). Pt will benefit from skilled therapy to maximize functional mobility, independence and safety, while decreasing caregiver burden.   Pt vitals prior to session: BP supine HOB 30 degrees 135/73 (92), HR 96, SpO2 96% 2L [place] Pt vitals in seated after session: BP 137/69 (88), HR 104, SpO2 98% 2L [place]    Follow Up Recommendations Home health PT;Supervision for mobility/OOB    Equipment Recommendations  3in1 (PT)    Recommendations for Other Services OT consult     Precautions / Restrictions Precautions Precautions: Fall Restrictions Weight Bearing Restrictions: No      Mobility  Bed Mobility Overal bed mobility: Needs Assistance Bed Mobility: Supine to Sit     Supine to sit: Min assist;HOB elevated     General bed mobility comments: pt reaching for PT hands to assist up, cues for rail use, pt unable to reach. Min assist to pivot hips and elevate trunk.   Transfers Overall transfer level: Needs assistance Equipment used: Rolling walker (2  wheeled) Transfers: Sit to/from Stand Sit to Stand: Min assist;+2 physical assistance;+2 safety/equipment         General transfer comment: cues for hand placement to push off bed vs pull on RW.   Ambulation/Gait Ambulation/Gait assistance: Min assist;+2 safety/equipment(+2 for chair follow ) Gait Distance (Feet): 10 Feet Assistive device: Rolling walker (2 wheeled) Gait Pattern/deviations: Step-through pattern;Decreased stride length;Trunk flexed;Wide base of support Gait velocity: decreased  Gait velocity interpretation: <1.31 ft/sec, indicative of household ambulator General Gait Details: gait guarded, pt fear of falling after a previous fall 6 weeks ago. Pt HR spike to 148 with prompt return to chair. HR recovery to 111 <30 seconds.   Stairs            Wheelchair Mobility    Modified Rankin (Stroke Patients Only)       Balance Overall balance assessment: Needs assistance Sitting-balance support: Bilateral upper extremity supported;Feet supported Sitting balance-Leahy Scale: Poor Sitting balance - Comments: bil UE support, min assist to achieve sitting. Pt unable to remove both hands at same time.      Standing balance-Leahy Scale: Poor Standing balance comment: min assist +2 to stand, bil UE support on RW                              Pertinent Vitals/Pain Pain Assessment: No/denies pain    Home Living Family/patient expects to be discharged to:: Private residence Living Arrangements: Spouse/significant other;Children(daughter ) Available Help at Discharge: Family Type of Home: House Home Access:  Stairs to enter   [employer] of Steps: 1 ( Home Layout: Multi-level;Able to live on main level with bedroom/bathroom Home Equipment: Walker - 2 wheels;Hand held shower head;Shower seat Additional Comments: split level home with apartment setup    Prior Function Level of Independence: Independent with assistive device(s)          Comments: sponge bathing, hasn't been walking since fall 6 weeks ago     Hand Dominance        Extremity/Trunk Assessment   Upper Extremity Assessment Upper Extremity Assessment: Generalized weakness;Defer to OT evaluation    Lower Extremity Assessment Lower Extremity Assessment: Generalized weakness(Left slightly weaker than right )    Cervical / Trunk Assessment Cervical / Trunk Assessment: Kyphotic  Communication   Communication: No difficulties  Cognition Arousal/Alertness: Awake/alert Behavior During Therapy: Flat affect Overall Cognitive Status: Difficult to assess                                 General Comments: trouble talking shortly after extubated.       General Comments General comments (skin integrity, edema, etc.): bil LE skin dry, flaking     Exercises     Assessment/Plan    PT Assessment Patient needs continued PT services  PT Problem List Decreased strength;Decreased mobility;Decreased safety awareness;Decreased range of motion;Decreased coordination;Decreased activity tolerance;Decreased balance;Decreased knowledge of use of DME       PT Treatment Interventions DME instruction;Therapeutic activities;Gait training;Therapeutic exercise;Patient/family education;Stair training;Balance training;Functional mobility training    PT Goals (Current goals can be found in the Care Plan section)  Acute Rehab PT Goals Patient Stated Goal: return home  Time For Goal Achievement: 04/11/18 Potential to Achieve Goals: Good    Frequency Min 3X/week   Barriers to discharge Decreased caregiver support family states unable to physically assist at this time, with improvement to min guard for transfers believe family would be able to handle care at home.     Co-evaluation               AM-PAC PT "6 Clicks" Daily Activity  Outcome Measure Difficulty turning over in bed (including adjusting bedclothes, sheets and blankets)?:  Unable Difficulty moving from lying on back to sitting on the side of the bed? : Unable Difficulty sitting down on and standing up from a chair with arms (e.g., wheelchair, bedside commode, etc,.)?: Unable Help needed moving to and from a bed to chair (including a wheelchair)?: A Lot Help needed walking in hospital room?: A Little Help needed climbing 3-5 steps with a railing? : A Lot 6 Click Score: 10    End of Session Equipment Utilized During Treatment: Gait belt;Oxygen Activity Tolerance: Patient tolerated treatment well;Patient limited by fatigue;Treatment limited secondary to medical complications (Comment)(ambulation limited by increased HR) Patient left: in chair;with call bell/phone within reach;with family/visitor present(RN stated no chair alarm needed ) Nurse Communication: Mobility status PT Visit Diagnosis: Unsteadiness on feet (R26.81);Other abnormalities of gait and mobility (R26.89);History of falling (Z91.81);Muscle weakness (generalized) (M62.81)    Time: 5643-3295 PT Time Calculation (min) (ACUTE ONLY): 24 min   Charges:   PT Evaluation $PT Eval Moderate Complexity: [place], [place]  Acute Rehab [phone]   Samuella Bruin 03/28/2018, 1:04 PM

## 2018-03-29 ENCOUNTER — Inpatient Hospital Stay (HOSPITAL_COMMUNITY): Payer: Medicare Other

## 2018-03-29 DIAGNOSIS — R55 Syncope and collapse: Secondary | ICD-10-CM

## 2018-03-29 DIAGNOSIS — E876 Hypokalemia: Secondary | ICD-10-CM

## 2018-03-29 DIAGNOSIS — E871 Hypo-osmolality and hyponatremia: Secondary | ICD-10-CM

## 2018-03-29 DIAGNOSIS — I340 Nonrheumatic mitral (valve) insufficiency: Secondary | ICD-10-CM

## 2018-03-29 DIAGNOSIS — I5031 Acute diastolic (congestive) heart failure: Secondary | ICD-10-CM

## 2018-03-29 DIAGNOSIS — J9621 Acute and chronic respiratory failure with hypoxia: Secondary | ICD-10-CM

## 2018-03-29 LAB — ECHOCARDIOGRAM COMPLETE
Height: 69 in
Weight: 3918.9 oz

## 2018-03-29 LAB — CULTURE, RESPIRATORY W GRAM STAIN

## 2018-03-29 LAB — COMPREHENSIVE METABOLIC PANEL
ALK PHOS: 32 U/L — AB (ref 38–126)
ALT: 33 U/L (ref 0–44)
ANION GAP: 8 (ref 5–15)
AST: 22 U/L (ref 15–41)
Albumin: 2.4 g/dL — ABNORMAL LOW (ref 3.5–5.0)
BUN: 11 mg/dL (ref 8–23)
CO2: 30 mmol/L (ref 22–32)
CREATININE: 0.86 mg/dL (ref 0.61–1.24)
Calcium: 7.8 mg/dL — ABNORMAL LOW (ref 8.9–10.3)
Chloride: 94 mmol/L — ABNORMAL LOW (ref 98–111)
GLUCOSE: 152 mg/dL — AB (ref 70–99)
POTASSIUM: 3.4 mmol/L — AB (ref 3.5–5.1)
Sodium: 132 mmol/L — ABNORMAL LOW (ref 135–145)
TOTAL PROTEIN: 5.8 g/dL — AB (ref 6.5–8.1)
Total Bilirubin: 0.9 mg/dL (ref 0.3–1.2)

## 2018-03-29 LAB — GLUCOSE, CAPILLARY
GLUCOSE-CAPILLARY: 115 mg/dL — AB (ref 70–99)
GLUCOSE-CAPILLARY: 141 mg/dL — AB (ref 70–99)
GLUCOSE-CAPILLARY: 200 mg/dL — AB (ref 70–99)
Glucose-Capillary: 138 mg/dL — ABNORMAL HIGH (ref 70–99)

## 2018-03-29 LAB — CULTURE, RESPIRATORY

## 2018-03-29 LAB — MAGNESIUM: Magnesium: 2.1 mg/dL (ref 1.7–2.4)

## 2018-03-29 LAB — CBC
HEMATOCRIT: 29.9 % — AB (ref 39.0–52.0)
HEMOGLOBIN: 9.3 g/dL — AB (ref 13.0–17.0)
MCH: 28.9 pg (ref 26.0–34.0)
MCHC: 31.1 g/dL (ref 30.0–36.0)
MCV: 92.9 fL (ref 78.0–100.0)
Platelets: 166 10*3/uL (ref 150–400)
RBC: 3.22 MIL/uL — ABNORMAL LOW (ref 4.22–5.81)
RDW: 14.7 % (ref 11.5–15.5)
WBC: 12.3 10*3/uL — ABNORMAL HIGH (ref 4.0–10.5)

## 2018-03-29 LAB — HEPARIN LEVEL (UNFRACTIONATED): HEPARIN UNFRACTIONATED: 0.99 [IU]/mL — AB (ref 0.30–0.70)

## 2018-03-29 LAB — PHOSPHORUS: Phosphorus: 3.5 mg/dL (ref 2.5–4.6)

## 2018-03-29 LAB — PROCALCITONIN: Procalcitonin: <0.1 ng/mL

## 2018-03-29 LAB — APTT: aPTT: 86 seconds — ABNORMAL HIGH (ref 24–36)

## 2018-03-29 MED ORDER — FUROSEMIDE 10 MG/ML IJ SOLN
40.0000 mg | Freq: Two times a day (BID) | INTRAMUSCULAR | Status: DC
  Administered 2018-03-29 – 2018-03-31 (×4): 40 mg via INTRAVENOUS
  Filled 2018-03-29 (×5): qty 4

## 2018-03-29 MED ORDER — PANTOPRAZOLE SODIUM 40 MG PO TBEC
40.0000 mg | DELAYED_RELEASE_TABLET | Freq: Every day | ORAL | Status: DC
  Administered 2018-03-29 – 2018-04-04 (×7): 40 mg via ORAL
  Filled 2018-03-29 (×7): qty 1

## 2018-03-29 MED ORDER — INSULIN ASPART 100 UNIT/ML ~~LOC~~ SOLN
0.0000 [IU] | Freq: Three times a day (TID) | SUBCUTANEOUS | Status: DC
  Administered 2018-03-29: 3 [IU] via SUBCUTANEOUS
  Administered 2018-03-29 – 2018-03-30 (×2): 2 [IU] via SUBCUTANEOUS
  Administered 2018-03-30 – 2018-03-31 (×3): 3 [IU] via SUBCUTANEOUS
  Administered 2018-03-31 – 2018-04-01 (×2): 2 [IU] via SUBCUTANEOUS
  Administered 2018-04-02 – 2018-04-03 (×4): 3 [IU] via SUBCUTANEOUS
  Administered 2018-04-04: 2 [IU] via SUBCUTANEOUS
  Administered 2018-04-04: 3 [IU] via SUBCUTANEOUS

## 2018-03-29 MED ORDER — IPRATROPIUM-ALBUTEROL 0.5-2.5 (3) MG/3ML IN SOLN: 3.0000 mL | RESPIRATORY_TRACT | Status: DC | PRN

## 2018-03-29 MED ORDER — POTASSIUM CHLORIDE 10 MEQ/100ML IV SOLN
10.0000 meq | INTRAVENOUS | Status: AC
  Administered 2018-03-29 (×4): 10 meq via INTRAVENOUS
  Filled 2018-03-29 (×4): qty 100

## 2018-03-29 MED ORDER — IPRATROPIUM-ALBUTEROL 0.5-2.5 (3) MG/3ML IN SOLN
3.0000 mL | Freq: Three times a day (TID) | RESPIRATORY_TRACT | Status: DC
  Administered 2018-03-29 (×3): 3 mL via RESPIRATORY_TRACT
  Filled 2018-03-29 (×2): qty 3
  Filled 2018-03-29: qty 30

## 2018-03-29 MED ORDER — POTASSIUM CHLORIDE CRYS ER 20 MEQ PO TBCR
40.0000 meq | EXTENDED_RELEASE_TABLET | Freq: Once | ORAL | Status: AC
  Administered 2018-03-29: 40 meq via ORAL
  Filled 2018-03-29: qty 2

## 2018-03-29 NOTE — Progress Notes (Signed)
  Echocardiogram 2D Echocardiogram has been performed.  Terry Wu F 03/29/2018, 1:30 PM

## 2018-03-29 NOTE — Progress Notes (Signed)
eLink Physician-Brief Progress Note Patient Name: Terry Wu DOB: [birth year] MRN: [id number]   Date of Service  03/29/2018  HPI/Events of Note  Hypokalemia  eICU Interventions  Potassium replaced     Intervention Category Minor Interventions: Electrolytes abnormality - evaluation and management  Chaz Mcglasson 03/29/2018, 4:55 AM

## 2018-03-29 NOTE — Progress Notes (Signed)
[facility] ADULT ICU REPLACEMENT PROTOCOL FOR AM LAB REPLACEMENT ONLY  The patient does not apply for the [facility] Adult ICU Electrolyte Replacment Protocol based on the criteria listed below:     Is urine output >/= 0.5 ml/kg/hr for the last 6 hours? No. Patient's UOP is 0.4 ml/kg/hr   Abnormal electrolyte(s):K3.4   If a panic level lab has been reported, has the CCM MD in charge been notified? Yes.  .   Physician:  E Deterding,MD  Vear Clock 03/29/2018 4:34 AM

## 2018-03-29 NOTE — Progress Notes (Signed)
ANTICOAGULATION CONSULT NOTE - Follow Up Consult  Pharmacy Consult for Heparin Indication: atrial fibrillation  Allergies  Allergen Reactions  . Asa [Aspirin] Anaphylaxis and Swelling    Throat swells   . Demerol [Meperidine Hcl] Shortness Of Breath, Swelling and Other (See Comments)    "Suppresses vagus nerve" (occurred during a procedure)    Patient Measurements: Height: 5\' 9"  (175.3 cm) Weight: 244 lb 14.9 oz (111.1 kg) IBW/kg (Calculated) : 70.7 Heparin Dosing Weight: 99  Vital Signs: Temp: 97.8 F (36.6 C) (10/05 0400) Temp Source: Oral (10/05 0400) BP: 123/64 (10/05 0500) Pulse Rate: 71 (10/05 0500)  Labs: Recent Labs    03/29/2018 1610  04/08/2018 2221 03/27/18 0410 03/27/18 0617 03/27/18 1028  03/28/18 0352 03/28/18 0353 03/28/18 1256 03/28/18 2237 03/29/18 0240  HGB 10.2*   < >  --  11.0*  --   --   --  9.4*  --   --   --  9.3*  HCT 32.4*   < >  --  33.8*  --   --   --  28.9*  --   --   --  29.9*  PLT 214  --   --  196  --   --   --  189  --   --   --  166  APTT  --   --   --   --  98*  --    < > 116*  --  60* 88* 86*  LABPROT 22.6*  --   --   --   --   --   --   --   --   --   --   --   INR 2.01  --   --   --   --   --   --   --   --   --   --   --   HEPARINUNFRC  --   --   --   --  >2.20*  --   --   --  1.58*  --   --  0.99*  CREATININE 1.11   < > 0.92 0.91  --   --   --  0.96  --   --   --  0.86  TROPONINI 0.03*  --  0.10* 0.09*  --  0.06*  --   --   --   --   --   --    < > = values in this interval not displayed.    Estimated Creatinine Clearance: 78.6 mL/min (by C-G formula based on SCr of 0.86 mg/dL).   Assessment: 84 yo M admitted s/p cardiac arrest. Pt was on Eliquis PTA for Afib and started on IV heparin while in ICU. Last dose of Eliquis PTA was 10/2 at 0500 per med rec. Continuing to dose based on APTT as HL was still falsely elevated due to Eliquis. HL continues to downtrend, will likely transition to HL monitoring tomorrow.   APTT  therapeutic at 86 on 1100 units/hr. Hb low but stable. No signs/symptoms of bleeding or issues with infusion reported by nursing.  Goal of Therapy:  Heparin level 0.3-0.7 units/ml Monitor platelets by anticoagulation protocol: Yes    Plan:  - Continue Heparin 1100 units/hr - Monitor daily aPTT and HL - Monitor bleeding   Claiborne Billings, PharmD PGY2 Cardiology Pharmacy Resident Phone [phone] 03/29/2018 6:16 AM

## 2018-03-29 NOTE — Progress Notes (Signed)
Potassium level 3.4. Currently no order for electrolyte replacement. Notified ELink provider. Awaiting potassium replacement orders.

## 2018-03-29 NOTE — Progress Notes (Signed)
Shallow, tachypneic breathing observed. Assisted pt up in bed with HOB elevated & instructed to cough. Non-productive cough. Pt on 1 L [place], O2 sat 94%. PRN neb tx currently ordered. RT notified of pt status & will administer breathing treatment. Assessment ongoing.

## 2018-03-29 NOTE — Progress Notes (Signed)
[facility] PROGRESS NOTE  Terry Wu EPP:[id number] DOB: 10-08-1931 DOA: 03/25/2018  PCP: Sinda Du, MD  Brief History/Interval Summary: 84 year old Caucasian male with a past medical history of aortic aneurysm, chronic atrial fibrillation on Eliquis, coronary artery disease status post CABG and aortic valve replacement in 2006, essential hypertension, diabetes mellitus type 2, history of prostate cancer, history of colon cancer status post resection and colostomy in 6063 complicated by postoperative stroke, history of COPD presented after the past out in front of his wife.  Apparently he was in cardiac arrest.  CPR was done on the patient.  He had return of spontaneous circulation and 5 minutes.  Apparently no ACLS medications were given.  However patient was intubated.  He was admitted to the intensive care unit.  He was stabilized in the ICU.  He was extubated.  Reason for Visit: Status post cardiac arrest.  Consultants: Critical care medicine was following.  Procedures: None  Antibiotics: Vancomycin for staph aureus in the sputum.  Subjective/Interval History: Patient somewhat distracted this morning.  He denies any chest pain.  Denies any shortness of breath.  No nausea vomiting.  ROS: Denies any headaches  Objective:  Vital Signs  Vitals:   03/29/18 0800 03/29/18 0805 03/29/18 0834 03/29/18 0900  BP:  (!) 142/75  131/82  Pulse: 81 82  74  Resp: (!) 22 (!) 26  (!) 30  Temp:      TempSrc:      SpO2: 99% 99% 99% 98%  Weight:      Height:        Intake/Output Summary (Last 24 hours) at 03/29/2018 1057 Last data filed at 03/29/2018 0800 Gross per 24 hour  Intake 601.64 ml  Output 1470 ml  Net -868.36 ml   Filed Weights   03/27/18 1622 03/28/18 0427 03/29/18 0546  Weight: 115.3 kg 113.8 kg 111.1 kg    General appearance: alert, cooperative, appears stated age, distracted and no distress Head: Normocephalic, without obvious abnormality,  atraumatic Resp: Coarse breath sounds bilaterally.  Mildly tachypneic.  Diminished air entry at the bases.  No definite wheezing or rhonchi. Cardio: regular rate and rhythm, S1, S2 normal, no murmur, click, rub or gallop GI: soft, non-tender; bowel sounds normal; no masses,  no organomegaly Extremities: Chronic skin changes noted in both lower extremity with some edema.  Scaling of skin noted. Pulses: 2+ and symmetric Skin: Skin scaling noted bilateral lower extremities Neurologic: He is awake alert.  He is oriented to place year person.  Cranial nerves II to XII intact.  Motor strength appear to be equal.  He is noted to have generalized weakness.  Lab Results:  Data Reviewed: I have personally reviewed following labs and imaging studies  CBC: Recent Labs  Lab 04/02/2018 1610 04/15/2018 1707 03/27/18 0410 03/28/18 0352 03/29/18 0240  WBC 11.2*  --  14.1* 12.4* 12.3*  NEUTROABS 7.6  --   --  10.1*  --   HGB 10.2* 11.2* 11.0* 9.4* 9.3*  HCT 32.4* 33.0* 33.8* 28.9* 29.9*  MCV 93.9  --  90.1 90.3 92.9  PLT 214  --  196 189 016    Basic Metabolic Panel: Recent Labs  Lab 04/20/2018 1610 04/09/2018 1707 03/25/2018 2221 03/27/18 0410 03/28/18 0352 03/29/18 0240  NA 126* 124* 130* 129* 131* 132*  K 6.1* 5.9* 4.2 4.1 3.4* 3.4*  CL 89* 89* 89* 90* 92* 94*  CO2 27  --  31 28 28 30   GLUCOSE 196* 196* 134* 90  95 152*  BUN 16 20 16 16 13 11   CREATININE 1.11 1.10 0.92 0.91 0.96 0.86  CALCIUM 8.6*  --  9.0 8.5* 8.1* 7.8*  MG  --   --  2.0 1.9 1.8 2.1  PHOS  --   --  3.5 3.3 3.1 3.5    GFR: Estimated Creatinine Clearance: 78.6 mL/min (by C-G formula based on SCr of 0.86 mg/dL).  Liver Function Tests: Recent Labs  Lab 04/15/2018 1610 03/29/2018 2221 03/27/18 0410 03/29/18 0240  AST 58*  --   --  22  ALT 50*  --   --  33  ALKPHOS 37*  --   --  32*  BILITOT 0.9  --   --  0.9  PROT 6.6  --   --  5.8*  ALBUMIN 2.7* 2.8* 2.7* 2.4*     Recent Labs  Lab 03/27/18 0617  AMMONIA 35     Coagulation Profile: Recent Labs  Lab 04/09/2018 1610  INR 2.01    Cardiac Enzymes: Recent Labs  Lab 04/20/2018 1610 04/02/2018 2221 03/27/18 0410 03/27/18 1028  TROPONINI 0.03* 0.10* 0.09* 0.06*    CBG: Recent Labs  Lab 03/28/18 1624 03/28/18 1803 03/29/18 0002 03/29/18 0413 03/29/18 0731  GLUCAP 126* 138* 200* 141* 115*    Thyroid Function Tests: Recent Labs    03/27/18 0617  TSH 3.192     Recent Results (from the past 240 hour(s))  Culture, blood (routine x 2)     Status: None (Preliminary result)   Collection Time: 03/31/2018  6:16 PM  Result Value Ref Range Status   Specimen Description BLOOD RIGHT ARM  Final   Special Requests   Final    BOTTLES DRAWN AEROBIC AND ANAEROBIC Blood Culture adequate volume   Culture   Final    NO GROWTH 2 DAYS Performed at [facility], [place] [address] [place], [place] [address]    Report Status PENDING  Incomplete  MRSA PCR Screening     Status: None   Collection Time: 03/31/2018  9:50 PM  Result Value Ref Range Status   MRSA by PCR NEGATIVE NEGATIVE Final    Comment:        The GeneXpert MRSA Assay (FDA approved for NASAL specimens only), is one component of a comprehensive MRSA colonization surveillance program. It is not intended to diagnose MRSA infection nor to guide or monitor treatment for MRSA infections. Performed at [facility], [place] [address] [place], [place] [address]   Culture, blood (routine x 2)     Status: None (Preliminary result)   Collection Time: 04/07/2018 10:15 PM  Result Value Ref Range Status   Specimen Description BLOOD RIGHT HAND  Final   Special Requests   Final    BOTTLES DRAWN AEROBIC ONLY Blood Culture adequate volume   Culture   Final    NO GROWTH 2 DAYS Performed at [facility], [place] [address] [place], [place] [address]    Report Status PENDING  Incomplete  Culture, respiratory (non-expectorated)     Status: None (Preliminary result)   Collection Time:  03/27/18 11:29 AM  Result Value Ref Range Status   Specimen Description TRACHEAL ASPIRATE  Final   Special Requests NONE  Final   Gram Stain   Final    NO SQUAMOUS EPITHELIAL CELLS PRESENT ABUNDANT WBC PRESENT, PREDOMINANTLY PMN RARE GRAM POSITIVE COCCI    Culture   Final    FEW STAPHYLOCOCCUS AUREUS SUSCEPTIBILITIES TO FOLLOW Performed at  [facility], [place] [address], [place], [place] [address]    Report Status PENDING  Incomplete      Radiology Studies: Dg Chest Port 1 View  Result Date: 03/29/2018 CLINICAL DATA:  Respiratory failure EXAM: PORTABLE CHEST 1 VIEW COMPARISON:  03/28/2018 FINDINGS: Interval removal of endotracheal tube and NG tube. Cardiomegaly. Diffuse bilateral airspace opacities have worsened since prior study, likely worsening edema. Small to moderate layering bilateral effusions, also increased since prior study. IMPRESSION: Interval worsening it in diffuse bilateral airspace disease, likely worsening edema/CHF. Enlarging small to moderate bilateral effusions. Electronically Signed   By: Rolm Baptise M.D.   On: 03/29/2018 08:04   Dg Chest Port 1 View  Result Date: 03/28/2018 CLINICAL DATA:  Cardiac arrest EXAM: PORTABLE CHEST 1 VIEW COMPARISON:  03/27/2018 FINDINGS: Endotracheal and NG tubes are stable. Heart remains enlarged. Upper mediastinum remains prominent. Postoperative changes from CABG. Vascular congestion is stable. Stable haziness at both lung bases likely represents a combination of volume loss and pleural fluid. No pneumothorax. IMPRESSION: Stable vascular congestion and bibasilar haziness as described. Electronically Signed   By: Marybelle Killings M.D.   On: 03/28/2018 08:02     Medications:  Scheduled: . chlorhexidine  15 mL Mouth Rinse BID  . insulin aspart  0-9 Units Subcutaneous Q4H  . ipratropium-albuterol  3 mL Nebulization TID  . mouth rinse  15 mL Mouth Rinse q12n4p  . pantoprazole  40 mg Oral Daily   Continuous: . dextrose 5 % and 0.9%  NaCl Stopped (03/29/18 0002)  . DOPamine Stopped (03/31/2018 2120)  . heparin 1,100 Units/hr (03/29/18 0800)  . vancomycin     SJG:GEZMOQHUT, docusate, ipratropium-albuterol  Assessment/Plan:    Syncope along with cardiac arrest/history of coronary artery disease/possible diastolic CHF Reason for his presentation is not entirely clear.  His troponin peaked at 0.1.  So not a significant elevation.  He did get CPR as well.  EKG showed atrial fibrillation.  No ischemic changes were noted.  Patient does have a history of coronary artery disease status post CABG and aortic valve replacement.  X-ray from this morning showed a worsening pulmonary edema.  Echocardiogram done in 2018 showed normal systolic function.  He has 2 different medical records.  Echocardiogram has been ordered and is pending.  Patient takes furosemide at home.  Currently not on any diuretics.  This will be ordered.  Looks like he was given 1 dose of IV Lasix yesterday.  Strict ins and outs daily weights.  Acute respiratory failure with hypoxia/positive sputum culture for staph aureus/possible pneumonia He had to be intubated due to cardiac arrest.  He was extubated successfully.  Respiratory status appears to be stable.  Chest x-ray does show pulmonary edema today.  He will be given diuretics.  Sputum cultures with few staph aureus.  Apparently he had low-grade fever.  He was started on vancomycin by pulmonology.  This will be continued for now.  Lactic acid was elevated at 3.43 and improved to 1.81 subsequently.  Procalcitonin was asked to be 0.11.  History of COPD Stable.  Chronic atrial fibrillation Heart rate seems to be reasonably well controlled.  He was on dopamine briefly now off of it.  He is on Eliquis at home.  It appears that he was on carvedilol at home.  Noted to be on IV heparin in the hospital.  Hyponatremia and hypokalemia This will be repleted.  Low sodium levels likely due to volume overload.  Normocytic  anemia No evidence of  overt bleeding.  Diabetes mellitus type 2 Monitor CBGs.  Tinea SSI.  Essential hypertension Monitor blood pressures closely.  DVT Prophylaxis: On IV heparin    Code Status: Partial code Family Communication: No family at bedside Disposition Plan: Ok for transition to stepdown unit.  Start mobilizing.  Intravenous diuretics.    LOS: 3 days   [facility] Pager [phone] 03/29/2018, 10:57 AM  If 7PM-7AM, please contact night-coverage at [email or web address], password [email or web address]

## 2018-03-30 ENCOUNTER — Inpatient Hospital Stay (HOSPITAL_COMMUNITY): Payer: Medicare Other

## 2018-03-30 DIAGNOSIS — I482 Chronic atrial fibrillation, unspecified: Secondary | ICD-10-CM

## 2018-03-30 DIAGNOSIS — D649 Anemia, unspecified: Secondary | ICD-10-CM

## 2018-03-30 LAB — GLUCOSE, CAPILLARY
GLUCOSE-CAPILLARY: 192 mg/dL — AB (ref 70–99)
GLUCOSE-CAPILLARY: 212 mg/dL — AB (ref 70–99)
Glucose-Capillary: 137 mg/dL — ABNORMAL HIGH (ref 70–99)

## 2018-03-30 LAB — CBC
HCT: 29.9 % — ABNORMAL LOW (ref 39.0–52.0)
HEMOGLOBIN: 9.2 g/dL — AB (ref 13.0–17.0)
MCH: 28.8 pg (ref 26.0–34.0)
MCHC: 30.8 g/dL (ref 30.0–36.0)
MCV: 93.4 fL (ref 78.0–100.0)
Platelets: 174 10*3/uL (ref 150–400)
RBC: 3.2 MIL/uL — AB (ref 4.22–5.81)
RDW: 14.5 % (ref 11.5–15.5)
WBC: 11.5 10*3/uL — ABNORMAL HIGH (ref 4.0–10.5)

## 2018-03-30 LAB — BASIC METABOLIC PANEL
ANION GAP: 8 (ref 5–15)
BUN: 11 mg/dL (ref 8–23)
CHLORIDE: 95 mmol/L — AB (ref 98–111)
CO2: 31 mmol/L (ref 22–32)
Calcium: 8.2 mg/dL — ABNORMAL LOW (ref 8.9–10.3)
Creatinine, Ser: 0.81 mg/dL (ref 0.61–1.24)
GFR calc non Af Amer: >60 mL/min (ref >60)
Glucose, Bld: 127 mg/dL — ABNORMAL HIGH (ref 70–99)
POTASSIUM: 4 mmol/L (ref 3.5–5.1)
Sodium: 134 mmol/L — ABNORMAL LOW (ref 135–145)

## 2018-03-30 LAB — RETICULOCYTES
RBC.: 3.2 MIL/uL — ABNORMAL LOW (ref 4.22–5.81)
Retic Count, Absolute: 105.6 10*3/uL (ref 19.0–186.0)
Retic Ct Pct: 3.3 % — ABNORMAL HIGH (ref 0.4–3.1)

## 2018-03-30 LAB — HEPARIN LEVEL (UNFRACTIONATED): Heparin Unfractionated: 0.42 IU/mL (ref 0.30–0.70)

## 2018-03-30 LAB — IRON AND TIBC
IRON: 27 ug/dL — AB (ref 45–182)
SATURATION RATIOS: 11 % — AB (ref 17.9–39.5)
TIBC: 246 ug/dL — AB (ref 250–450)
UIBC: 219 ug/dL

## 2018-03-30 LAB — FERRITIN: Ferritin: 129 ng/mL (ref 24–336)

## 2018-03-30 LAB — APTT: aPTT: 82 seconds — ABNORMAL HIGH (ref 24–36)

## 2018-03-30 LAB — FOLATE: FOLATE: 19.4 ng/mL (ref >5.9)

## 2018-03-30 LAB — VITAMIN B12: Vitamin B-12: 706 pg/mL (ref 180–914)

## 2018-03-30 MED ORDER — IPRATROPIUM-ALBUTEROL 0.5-2.5 (3) MG/3ML IN SOLN
3.0000 mL | Freq: Two times a day (BID) | RESPIRATORY_TRACT | Status: DC
  Administered 2018-03-30 – 2018-04-03 (×9): 3 mL via RESPIRATORY_TRACT
  Filled 2018-03-30 (×9): qty 3

## 2018-03-30 MED ORDER — APIXABAN 5 MG PO TABS
5.0000 mg | ORAL_TABLET | Freq: Two times a day (BID) | ORAL | Status: DC
  Administered 2018-03-30 – 2018-04-02 (×6): 5 mg via ORAL
  Filled 2018-03-30 (×7): qty 1

## 2018-03-30 MED ORDER — DOXYCYCLINE HYCLATE 100 MG PO TABS
100.0000 mg | ORAL_TABLET | Freq: Two times a day (BID) | ORAL | Status: DC
  Administered 2018-03-30 – 2018-03-31 (×4): 100 mg via ORAL
  Filled 2018-03-30 (×5): qty 1

## 2018-03-30 MED ORDER — CARVEDILOL 3.125 MG PO TABS
3.1250 mg | ORAL_TABLET | Freq: Two times a day (BID) | ORAL | Status: DC
  Administered 2018-03-30 – 2018-04-04 (×11): 3.125 mg via ORAL
  Filled 2018-03-30 (×12): qty 1

## 2018-03-30 NOTE — Progress Notes (Addendum)
ANTICOAGULATION CONSULT NOTE - Follow Up Consult  Pharmacy Consult for Heparin Indication: atrial fibrillation  Allergies  Allergen Reactions  . Asa [Aspirin] Anaphylaxis and Swelling    Throat swells   . Demerol [Meperidine Hcl] Shortness Of Breath, Swelling and Other (See Comments)    "Suppresses vagus nerve" (occurred during a procedure)    Patient Measurements: Height: 5\' 9"  (175.3 cm) Weight: 244 lb 0.8 oz (110.7 kg) IBW/kg (Calculated) : 70.7 Heparin Dosing Weight: 99  Vital Signs: Temp: 98.3 F (36.8 C) (10/06 0300) Temp Source: Oral (10/06 0300) BP: 122/61 (10/06 0600) Pulse Rate: 85 (10/06 0600)  Labs: Recent Labs    03/27/18 1028  03/28/18 0352 03/28/18 0353  03/28/18 2237 03/29/18 0240 03/30/18 0230  HGB  --    < > 9.4*  --   --   --  9.3* 9.2*  HCT  --   --  28.9*  --   --   --  29.9* 29.9*  PLT  --   --  189  --   --   --  166 174  APTT  --    < > 116*  --    < > 88* 86* 82*  HEPARINUNFRC  --   --   --  1.58*  --   --  0.99* 0.42  CREATININE  --   --  0.96  --   --   --  0.86 0.81  TROPONINI 0.06*  --   --   --   --   --   --   --    < > = values in this interval not displayed.    Estimated Creatinine Clearance: 83.3 mL/min (by C-G formula based on SCr of 0.81 mg/dL).   Assessment: 84 yo M admitted s/p cardiac arrest. Pt was on Eliquis PTA for Afib and started on IV heparin while in ICU. Last dose of Eliquis PTA was 10/2 at 0500 per med rec. Previously dosing based on APTT as HL was falsely elevated due to Eliquis.   HL now correlating to aPTT, will discontinue aPTT monitoring. HL therapeutic at 0.42 on 1100 units/hr. Hb low but stable. No concerning signs/symptoms of bleeding or issues with infusion reported by nursing. Minor bleeding reported on lips.  Goal of Therapy:  Heparin level 0.3-0.7 units/ml Monitor platelets by anticoagulation protocol: Yes    Plan:  - Continue Heparin 1100 units/hr - Follow up plan to switch back to apixaban -  Monitor daily  HL and CBC - Monitor signs/symptoms of bleeding   Claiborne Billings, PharmD PGY2 Cardiology Pharmacy Resident Phone [phone] 03/30/2018 6:53 AM    Addendum: Switching patient back to PTA apixaban. Given patient's age, weight, and renal function, continue home dose apixaban at 5 mg twice daily. Can turn off heparin gtt when administering first dose of apixaban.

## 2018-03-30 NOTE — Progress Notes (Signed)
Patient transferred into 5W10. Wife and daughter at bedside. Patient placed on [id number] and is currently running atrial fibrillation in 70-80's. Glasses and personal hygiene belongings at bedside. Noted skin tear to LLE. Foam dressing placed. Patient oriented to unit and unit expectations. Bed alarm turned on. Call bell within reach. Bed in lowest position. Will continue to monitor patient.

## 2018-03-30 NOTE — Progress Notes (Signed)
[facility] PROGRESS NOTE  Terry Wu ZOX:[id number] DOB: [birth year] DOA: 04/04/2018  PCP: Sinda Du, MD  Brief History/Interval Summary: 84 year old Caucasian male with a past medical history of aortic aneurysm, chronic atrial fibrillation on Eliquis, coronary artery disease status post CABG and aortic valve replacement in 2006, essential hypertension, diabetes mellitus type 2, history of prostate cancer, history of colon cancer status post resection and colostomy in 8119 complicated by postoperative stroke, history of COPD presented after he passed out in front of his wife.  Apparently he was in cardiac arrest.  CPR was done on the patient.  He had return of spontaneous circulation in 5 minutes.  Apparently no ACLS medications were given.  However patient was intubated.  He was admitted to the intensive care unit.  He was stabilized in the ICU.  He was extubated.  Reason for Visit: Status post cardiac arrest.  Consultants: Critical care medicine was following.  Procedures:   Transthoracic echocardiogram Study Conclusions  - Left ventricle: The cavity size was normal. Wall thickness was   normal. Systolic function was normal. The estimated ejection   fraction was in the range of 60% to 65%. Wall motion was normal;   there were no regional wall motion abnormalities. - Aortic valve: Bioprosthetic aortic valve. No significant   bioprosthetic valve stenosis or regurgitation. Mean gradient (S):   8 mm Hg. - Mitral valve: Mildly calcified annulus. There was mild   regurgitation. - Left atrium: The atrium was moderately to severely dilated. - Right ventricle: The cavity size was normal. Systolic function   was mildly reduced. - Right atrium: The atrium was moderately to severely dilated. - Tricuspid valve: Peak RV-RA gradient (S): 35 mm Hg. - Pulmonary arteries: PA peak pressure: 43 mm Hg (S). - Systemic veins: IVC measured 2.5 cm with > 50% respirophasic   variation,  suggesting RA pressure 8 mmHg.  Impressions:  - Normal LV size with EF 60-65%. Normal RV size with mildly   decreased systolic function. Normally functioning bioprosthetic   aortic valve. Mild pulmonary hypertension.  Antibiotics: Vancomycin for staph aureus in the sputum.  Subjective/Interval History: Patient remains distracted.  Denies any chest pain.  She denies any shortness of breath.  No nausea vomiting.    ROS: Denies any headaches  Objective:  Vital Signs  Vitals:   03/30/18 0600 03/30/18 0731 03/30/18 0732 03/30/18 0800  BP: 122/61   98/75  Pulse: 85   (!) 132  Resp: (!) 26   (!) 22  Temp:  98.3 F (36.8 C)    TempSrc:  Axillary    SpO2: 98%  98% 95%  Weight: 110.7 kg     Height:        Intake/Output Summary (Last 24 hours) at 03/30/2018 0830 Last data filed at 03/30/2018 0800 Gross per 24 hour  Intake 1207.76 ml  Output 2510 ml  Net -1302.24 ml   Filed Weights   03/28/18 0427 03/29/18 0546 03/30/18 0600  Weight: 113.8 kg 111.1 kg 110.7 kg    General appearance: Awake alert.  Distracted.  In no distress. Resp: Noted to be tachypneic at rest.  No use of accessory muscles.  Coarse breath sounds.  Crackles bilaterally.  Mainly at the bases though.  No wheezing or rhonchi.   Cardio: S1-S2 is irregularly irregular.  No S3-S4.  No rubs murmurs or bruit GI: Abdomen soft.  Nontender nondistended Extremities: Chronic skin changes with scaly skin noted in both lower extremities with about 1+ edema. Neurologic:  Awake alert.  Oriented to place year person.  No obvious focal neurological deficits.    Lab Results:  Data Reviewed: I have personally reviewed following labs and imaging studies  CBC: Recent Labs  Lab 04/14/2018 1610 03/25/2018 1707 03/27/18 0410 03/28/18 0352 03/29/18 0240 03/30/18 0230  WBC 11.2*  --  14.1* 12.4* 12.3* 11.5*  NEUTROABS 7.6  --   --  10.1*  --   --   HGB 10.2* 11.2* 11.0* 9.4* 9.3* 9.2*  HCT 32.4* 33.0* 33.8* 28.9* 29.9* 29.9*    MCV 93.9  --  90.1 90.3 92.9 93.4  PLT 214  --  196 189 166 735    Basic Metabolic Panel: Recent Labs  Lab 04/11/2018 2221 03/27/18 0410 03/28/18 0352 03/29/18 0240 03/30/18 0230  NA 130* 129* 131* 132* 134*  K 4.2 4.1 3.4* 3.4* 4.0  CL 89* 90* 92* 94* 95*  CO2 31 28 28 30 31   GLUCOSE 134* 90 95 152* 127*  BUN 16 16 13 11 11   CREATININE 0.92 0.91 0.96 0.86 0.81  CALCIUM 9.0 8.5* 8.1* 7.8* 8.2*  MG 2.0 1.9 1.8 2.1  --   PHOS 3.5 3.3 3.1 3.5  --     GFR: Estimated Creatinine Clearance: 83.3 mL/min (by C-G formula based on SCr of 0.81 mg/dL).  Liver Function Tests: Recent Labs  Lab 04/01/2018 1610 04/23/2018 2221 03/27/18 0410 03/29/18 0240  AST 58*  --   --  22  ALT 50*  --   --  33  ALKPHOS 37*  --   --  32*  BILITOT 0.9  --   --  0.9  PROT 6.6  --   --  5.8*  ALBUMIN 2.7* 2.8* 2.7* 2.4*     Recent Labs  Lab 03/27/18 0617  AMMONIA 35    Coagulation Profile: Recent Labs  Lab 04/04/2018 1610  INR 2.01    Cardiac Enzymes: Recent Labs  Lab 03/29/2018 1610 04/21/2018 2221 03/27/18 0410 03/27/18 1028  TROPONINI 0.03* 0.10* 0.09* 0.06*    CBG: Recent Labs  Lab 03/28/18 1803 03/29/18 0002 03/29/18 0413 03/29/18 0731 03/29/18 1148  GLUCAP 138* 200* 141* 115* 138*     Recent Results (from the past 240 hour(s))  Culture, blood (routine x 2)     Status: None (Preliminary result)   Collection Time: 04/13/2018  6:16 PM  Result Value Ref Range Status   Specimen Description BLOOD RIGHT ARM  Final   Special Requests   Final    BOTTLES DRAWN AEROBIC AND ANAEROBIC Blood Culture adequate volume   Culture   Final    NO GROWTH 3 DAYS Performed at [facility], [place] [address] [place], [place] [address]    Report Status PENDING  Incomplete  MRSA PCR Screening     Status: None   Collection Time: 04/19/2018  9:50 PM  Result Value Ref Range Status   MRSA by PCR NEGATIVE NEGATIVE Final    Comment:        The GeneXpert MRSA Assay (FDA approved for NASAL  specimens only), is one component of a comprehensive MRSA colonization surveillance program. It is not intended to diagnose MRSA infection nor to guide or monitor treatment for MRSA infections. Performed at [facility], [place] [address] [place], [place] [address]   Culture, blood (routine x 2)     Status: None (Preliminary result)   Collection Time: 03/30/2018 10:15 PM  Result Value Ref Range Status   Specimen Description BLOOD  RIGHT HAND  Final   Special Requests   Final    BOTTLES DRAWN AEROBIC ONLY Blood Culture adequate volume   Culture   Final    NO GROWTH 3 DAYS Performed at [facility], [address] [place], [place] [address]    Report Status PENDING  Incomplete  Culture, respiratory (non-expectorated)     Status: None   Collection Time: 03/27/18 11:29 AM  Result Value Ref Range Status   Specimen Description TRACHEAL ASPIRATE  Final   Special Requests NONE  Final   Gram Stain   Final    NO SQUAMOUS EPITHELIAL CELLS PRESENT ABUNDANT WBC PRESENT, PREDOMINANTLY PMN RARE GRAM POSITIVE COCCI Performed at [facility], [place] [address], [place], [place] [address]    Culture FEW STAPHYLOCOCCUS AUREUS  Final   Report Status 03/29/2018 FINAL  Final   Organism ID, Bacteria STAPHYLOCOCCUS AUREUS  Final      Susceptibility   Staphylococcus aureus - MIC*    CIPROFLOXACIN <=0.5 SENSITIVE Sensitive     ERYTHROMYCIN >=8 RESISTANT Resistant     GENTAMICIN <=0.5 SENSITIVE Sensitive     OXACILLIN 0.5 SENSITIVE Sensitive     TETRACYCLINE <=1 SENSITIVE Sensitive     VANCOMYCIN 1 SENSITIVE Sensitive     TRIMETH/SULFA <=10 SENSITIVE Sensitive     CLINDAMYCIN <=0.25 SENSITIVE Sensitive     RIFAMPIN <=0.5 SENSITIVE Sensitive     Inducible Clindamycin NEGATIVE Sensitive     * FEW STAPHYLOCOCCUS AUREUS      Radiology Studies: Dg Chest Port 1 View  Result Date: 03/29/2018 CLINICAL DATA:  Respiratory failure EXAM: PORTABLE CHEST 1 VIEW COMPARISON:  03/28/2018  FINDINGS: Interval removal of endotracheal tube and NG tube. Cardiomegaly. Diffuse bilateral airspace opacities have worsened since prior study, likely worsening edema. Small to moderate layering bilateral effusions, also increased since prior study. IMPRESSION: Interval worsening it in diffuse bilateral airspace disease, likely worsening edema/CHF. Enlarging small to moderate bilateral effusions. Electronically Signed   By: Rolm Baptise M.D.   On: 03/29/2018 08:04     Medications:  Scheduled: . chlorhexidine  15 mL Mouth Rinse BID  . furosemide  40 mg Intravenous Q12H  . insulin aspart  0-15 Units Subcutaneous TID WC  . ipratropium-albuterol  3 mL Nebulization BID  . mouth rinse  15 mL Mouth Rinse q12n4p  . pantoprazole  40 mg Oral Daily   Continuous: . heparin 1,100 Units/hr (03/30/18 0800)  . vancomycin Stopped (03/29/18 1652)   UJW:[id number], docusate, ipratropium-albuterol  Assessment/Plan:    Syncope along with cardiac arrest/history of coronary artery disease/possible diastolic CHF Reason for his presentation is not entirely clear.  His troponin peaked at 0.1.  So not a significant elevation.  He did get CPR as well.  EKG showed atrial fibrillation.  No ischemic changes were noted.  Patient does have a history of coronary artery disease status post CABG and aortic valve replacement.  Chest x-ray did show pulmonary edema so the patient was placed on IV Lasix.  He is diuresing well.  Echocardiogram showed normal systolic function.  No wall motion abnormalities.  His weight is decreasing.  Continue IV diuretics for now.  Apparently patient takes furosemide at home.  Strict ins and outs and daily weights.  If his cardiac issues remains stable he can follow-up with cardiology just as an outpatient.  Acute respiratory failure with hypoxia/positive sputum culture for staph aureus/possible pneumonia He had to be intubated due to cardiac arrest.  He was extubated successfully.  Respiratory  status appears to be stable.  Chest x-ray does show pulmonary edema today.  He will be given diuretics.  Sputum cultures with few staph aureus.  Apparently he had low-grade fever.  He was started on vancomycin by pulmonology.  This will be continued for now.  Lactic acid was elevated at 3.43 and improved to 1.81 subsequently.  Procalcitonin was asked to be 0.11.  No febrile episodes noted.  Culture positive for MSSA.  Change to doxycycline.  Repeat chest x-ray.  History of COPD Stable.  Chronic atrial fibrillation Noted to be mildly tachycardic this morning.  Resume his beta-blocker.  He was on Eliquis at home.  Currently on IV heparin.  Could transition him to Eliquis.    Hyponatremia and hypokalemia Potassium is normal this morning.  Low sodium levels likely due to volume overload.  Normocytic anemia No evidence of overt bleeding.  Hemoglobin remains stable.  Anemia panel reviewed.  Ferritin 129.  TIBC 246.  Folate 19.4.  Vitamin B12 706.  Diabetes mellitus type 2 CBGs are reasonably well controlled.  Essential hypertension Blood pressure remains stable.  DVT Prophylaxis: On IV heparin    Code Status: Partial code Family Communication: No family at bedside Disposition Plan: Follow-up and repeat chest x-ray.  Could transition him to telemetry later today.  Start mobilizing.    LOS: 4 days   [facility] Pager [phone] 03/30/2018, 8:30 AM  If 7PM-7AM, please contact night-coverage at [email or web address], password [facility]

## 2018-03-30 NOTE — Progress Notes (Signed)
Assumed care of patient from [place], [place].  Patient is up in recliner eating dinner.  Patient to be transferred to 5 [place] after he eats dinner.  Report has been called to [place], Therapist, sports by Abbe Amsterdam, RN.  Family at bedside to assist patient.  NAD noted.  Call bell in reach.

## 2018-03-30 NOTE — Progress Notes (Signed)
  Speech Language Pathology Treatment: Dysphagia  Patient Details Name: Terry Wu MRN: [id number] DOB: 1931-10-18 Today's Date: 03/30/2018 Time: 8786-7672 SLP Time Calculation (min) (ACUTE ONLY): 19 min  Assessment / Plan / Recommendation Clinical Impression  ST follow up for therapeutic diet tolerance.  Chart review indicated that the patient has been afebrile and lungs have been variable.  Chest xray from today is showing no change from exam yesterday which was showing Interval worsening it in diffuse bilateral airspace disease, likely worsening edema/CHF. Enlarging small to moderate bilateral Effusions.  Nursing reported improved intake this AM with 100% of his breakfast tray being eaten.  Nurse reported no obvious issues with intake.  Meal observation was completed and no obvious issues were seen.  Mastication of dry solids appeared to be functional and patient was able to take serial sips of thin via straw sips without any overt s/s of aspiration.   Extremely delayed cough was seen well after completion of POs that did not appear to be related to intake.  Patient with belching noted with intake.  Question possible esophageal component.  Recommend continue with a chopped diet with thin liquids.  ST will continue to follow.   HPI HPI:  84 year old with pmh of aortic aneurysm, chronic atrial fibrillation on Eliquis, CAD s/p CABG/AVR 2006, HTN, type 2 DM, HLD, prostate cancer with radioactive seed implant, colon cancer s/p resection and colostomy 02/07/2017 with post-op CVA, COPD presenting from home after witnessed unresponsiveness. 5 mins later on EMS arrival found pulseless and ROSC obtained after 5 mins without defib or meds.  Required intubation for airway protection with some purposeful movement in ER.        SLP Plan  Continue with current plan of care       Recommendations  Diet recommendations: Dysphagia 3 (mechanical soft);Thin liquid Liquids provided via: Cup;Straw Medication  Administration: Whole meds with puree Supervision: Patient able to self feed Compensations: Minimize environmental distractions;Slow rate;Small sips/bites Postural Changes and/or Swallow Maneuvers: Seated upright 90 degrees;Upright 30-60 min after meal                Oral Care Recommendations: Oral care BID Follow up Recommendations: Other (comment)(TBD) SLP Visit Diagnosis: Dysphagia, unspecified (R13.10) Plan: Continue with current plan of care       Dunn Loring, [place], [place] [facility] SLP [phone]  Terry Wu 03/30/2018, 11:01 AM

## 2018-03-31 LAB — BASIC METABOLIC PANEL
Anion gap: 8 (ref 5–15)
BUN: 11 mg/dL (ref 8–23)
CHLORIDE: 95 mmol/L — AB (ref 98–111)
CO2: 31 mmol/L (ref 22–32)
Calcium: 8.2 mg/dL — ABNORMAL LOW (ref 8.9–10.3)
Creatinine, Ser: 0.85 mg/dL (ref 0.61–1.24)
GFR calc Af Amer: >60 mL/min (ref >60)
GLUCOSE: 152 mg/dL — AB (ref 70–99)
Potassium: 4.2 mmol/L (ref 3.5–5.1)
SODIUM: 134 mmol/L — AB (ref 135–145)

## 2018-03-31 LAB — CULTURE, BLOOD (ROUTINE X 2)
CULTURE: NO GROWTH
CULTURE: NO GROWTH
SPECIAL REQUESTS: ADEQUATE
Special Requests: ADEQUATE

## 2018-03-31 LAB — GLUCOSE, CAPILLARY
GLUCOSE-CAPILLARY: 187 mg/dL — AB (ref 70–99)
GLUCOSE-CAPILLARY: 130 mg/dL — AB (ref 70–99)
GLUCOSE-CAPILLARY: 99 mg/dL (ref 70–99)
GLUCOSE-CAPILLARY: 129 mg/dL — AB (ref 70–99)
GLUCOSE-CAPILLARY: 160 mg/dL — AB (ref 70–99)
Glucose-Capillary: 158 mg/dL — ABNORMAL HIGH (ref 70–99)
Glucose-Capillary: 178 mg/dL — ABNORMAL HIGH (ref 70–99)
Glucose-Capillary: 161 mg/dL — ABNORMAL HIGH (ref 70–99)

## 2018-03-31 LAB — CBC
HCT: 29.7 % — ABNORMAL LOW (ref 39.0–52.0)
Hemoglobin: 9.3 g/dL — ABNORMAL LOW (ref 13.0–17.0)
MCH: 29.4 pg (ref 26.0–34.0)
MCHC: 31.3 g/dL (ref 30.0–36.0)
MCV: 94 fL (ref 78.0–100.0)
PLATELETS: 168 10*3/uL (ref 150–400)
RBC: 3.16 MIL/uL — ABNORMAL LOW (ref 4.22–5.81)
RDW: 14.5 % (ref 11.5–15.5)
WBC: 9.9 10*3/uL (ref 4.0–10.5)

## 2018-03-31 MED ORDER — FUROSEMIDE 10 MG/ML IJ SOLN
60.0000 mg | Freq: Three times a day (TID) | INTRAMUSCULAR | Status: DC
  Administered 2018-03-31 – 2018-04-05 (×16): 60 mg via INTRAVENOUS
  Filled 2018-03-31 (×17): qty 6

## 2018-03-31 MED ORDER — PRO-STAT SUGAR FREE PO LIQD
30.0000 mL | Freq: Two times a day (BID) | ORAL | Status: DC
  Administered 2018-03-31 – 2018-04-04 (×7): 30 mL via ORAL
  Filled 2018-03-31 (×9): qty 30

## 2018-03-31 NOTE — Care Management Important Message (Signed)
Important Message  Patient Details  Name: Terry Wu MRN: [id number] Date of Birth: 1931/09/11   Medicare Important Message Given:  Yes    Orbie Pyo 03/31/2018, 4:09 PM

## 2018-03-31 NOTE — Progress Notes (Signed)
Nutrition Follow-up  DOCUMENTATION CODES:   Obesity unspecified  INTERVENTION:   - Pro-stat 30 ml BID, each supplement provides 100 kcal and 15 grams of protein  - Encourage adequate PO intake  NUTRITION DIAGNOSIS:   Inadequate oral intake related to acute illness as evidenced by NPO status.  Progressing, pt now on Dysphgia 3 diet  GOAL:   Patient will meet greater than or equal to 90% of their needs  Progressing  MONITOR:   PO intake, Supplement acceptance, Weight trends, Skin, Labs, I & O's  ASSESSMENT:   84 yo male admitted s/p cardiac arrest. PMHx aoritc aneurysm, CAD s/p CABG/AVR, HTN, T2DM, HLD, prostate cancer, colon cancer s/p resection and colostomy 2018, COPD. CPAP/PSV.   10/4 - extubated  Weight down 7 lbs since admission likely related to negative fluid balance. Will continue to monitor weight trends.  Spoke with pt and wife at bedside. Pt resting in recliner. RD ordered pt's lunch meal per pt's wife request: mashed potatoes, sugar-free vanilla pudding, skim milk, coffee, and unsweetened tea.  Pt states that his appetite is good and that he got up around 0500 and was very hungry as breakfast was not delivered until closer to 0800. Pt states he ate "most" of breakfast.  RD to order Pro-stat to aid in increased nutrient needs related to wound healing.  Meal Completion: 25-100%  Medications reviewed and include: Lasix 60 mg, sliding scale Novolog, Protonix 40 mg daily  Labs reviewed: sodium 134 (L), hemoglobin 9.3 (L), HCT 29.7 (L) CBG's: 129, 212, 192, 137 x 24 hours  UOP: 1720 ml x 24 hours I/O's: -4.1 L since admit  Diet Order:   Diet Order            DIET DYS 3 Room service appropriate? Yes; Fluid consistency: Thin; Fluid restriction: 1200 mL Fluid  Diet effective now              EDUCATION NEEDS:   No education needs have been identified at this time  Skin:  Skin Assessment: Skin Integrity Issues: Stage III: buttocks  Last BM:   output via colostomy  Height:   Ht Readings from Last 1 Encounters:  03/27/18 5\' 9"  (1.753 m)    Weight:   Wt Readings from Last 1 Encounters:  03/31/18 112.3 kg    Ideal Body Weight:  72.7 kg  BMI:  Body mass index is 36.56 kg/m.  Estimated Nutritional Needs:   Kcal:  1800-2000  Protein:  100-115 grams  Fluid:  1.8-2.0 L    Gaynell Face, MS, RD, LDN Inpatient Clinical Dietitian Pager: [phone] Weekend/After Hours: [phone]

## 2018-03-31 NOTE — Evaluation (Signed)
Occupational Therapy Evaluation Patient Details Name: Terry Wu MRN: [id number] DOB: 1931-06-26 Today's Date: 03/31/2018    History of Present Illness 84 yo presenting as cardiac arrest at home s/p CPR and intubation 10/2-10/4. PMHx: Aortic aneurysm, chronic Afib, CAD s/p CABG/AVR 2006, HTN, type 2 DM, HLD, prostate cancer with radioactive seed implant, colon cancer s/p resection and colostomy 02/07/2017 with post-op CVA, COPD   Clinical Impression   Pt with limited activity tolerance as he fatigues with functional transfer to recliner with walker. Pt states his wife has helped with LB self care for a long time. Pt may benefit from AE for LB self care to increase his independence. Pt will benefit from OT to progress ADL independence for return home with wife. Will follow.    Follow Up Recommendations  Home health OT;Supervision/Assistance - 24 hour    Equipment Recommendations  None recommended by OT    Recommendations for Other Services       Precautions / Restrictions Precautions Precautions: Fall;Other (comment) Precaution Comments: watch HR Restrictions Weight Bearing Restrictions: No      Mobility Bed Mobility Overal bed mobility: Needs Assistance Bed Mobility: Supine to Sit     Supine to sit: HOB elevated;Mod assist     General bed mobility comments:  Assist with trunk to upright and LEs over to EOB. Mod assist with use of pad for scooting to EOB.  Transfers Overall transfer level: Needs assistance Equipment used: Rolling walker (2 wheeled) Transfers: Sit to/from Stand Sit to Stand: Min assist         General transfer comment: Needs cues for hand placement and assist to shift weight for sit to stand.     Balance Overall balance assessment: Needs assistance Sitting-balance support: Bilateral upper extremity supported Sitting balance-Leahy Scale: Poor                                     ADL either performed or assessed with clinical  judgement   ADL Overall ADL's : Needs assistance/impaired Eating/Feeding: Set up;Sitting   Grooming: Wash/dry face;Set up;Sitting   Upper Body Bathing: Minimal assistance;Sitting   Lower Body Bathing: Moderate assistance;Sit to/from stand   Upper Body Dressing : Minimal assistance;Sitting   Lower Body Dressing: Maximal assistance;Sit to/from stand   Toilet Transfer: Minimal assistance;Stand-pivot;RW   Toileting- Clothing Manipulation and Hygiene: Moderate assistance;Sit to/from stand         General ADL Comments: Pt states his wife assists with LB bathing and dressing at home. He fatigues easily with limited activity today. Only pivoted from bed to chair and pt needing rest break. HR 122 with up to chair but down to 110s with rest. Nursing student in room and to assist pt with bath when OT finished session. Pt not on O2 at home. Pt with O2 on during entire session and did decrease to mid 80s with pivot but then return to 95% with rest.     Vision Patient Visual Report: No change from baseline       Perception     Praxis      Pertinent Vitals/Pain Pain Assessment: No/denies pain     Hand Dominance     Extremity/Trunk Assessment Upper Extremity Assessment Upper Extremity Assessment: Generalized weakness           Communication Communication Communication: No difficulties   Cognition Arousal/Alertness: Awake/alert Behavior During Therapy: WFL for tasks assessed/performed Overall Cognitive Status: Within  Functional Limits for tasks assessed                                     General Comments       Exercises    Shoulder Instructions      Home Living Family/patient expects to be discharged to:: Private residence Living Arrangements: Spouse/significant other;Children Available Help at Discharge: Family Type of Home: House Home Access: Stairs to enter [employer] of Steps: 1   Home Layout: Multi-level;Able to live on main level  with bedroom/bathroom     Bathroom Shower/Tub: Occupational psychologist: Standard     Home Equipment: Walker - 2 wheels;Hand held shower head;Shower seat   Additional Comments: split level home with apartment setup      Prior Functioning/Environment Level of Independence: Independent with assistive device(s)        Comments: sponge bathing, hasn't been walking since fall 6 weeks ago. pt states wife helps with bathing, dressing. he has a colostomy bag and uses urinal for toileting.        OT Problem List: Decreased strength;Decreased activity tolerance;Decreased knowledge of use of DME or AE      OT Treatment/Interventions: Self-care/ADL training;DME and/or AE instruction;Therapeutic activities;Patient/family education;Energy conservation    OT Goals(Current goals can be found in the care plan section) Acute Rehab OT Goals Patient Stated Goal: to be able to go home. OT Goal Formulation: With patient Time For Goal Achievement: 04/14/18 Potential to Achieve Goals: Good  OT Frequency: Min 2X/week   Barriers to D/C:            Co-evaluation              AM-PAC PT "6 Clicks" Daily Activity     Outcome Measure Help from another person eating meals?: None Help from another person taking care of personal grooming?: A Little Help from another person toileting, which includes using toliet, bedpan, or urinal?: A Lot Help from another person bathing (including washing, rinsing, drying)?: A Lot Help from another person to put on and taking off regular upper body clothing?: A Little Help from another person to put on and taking off regular lower body clothing?: A Lot 6 Click Score: 16   End of Session Equipment Utilized During Treatment: Gait belt  Activity Tolerance: Patient limited by fatigue Patient left: in chair;with call bell/phone within reach;with chair alarm set  OT Visit Diagnosis: Unsteadiness on feet (R26.81);Muscle weakness (generalized) (M62.81)                 Time: 4888-9169 OT Time Calculation (min): 21 min Charges:  OT General Charges $OT Visit: 1 Visit OT Evaluation $OT Eval Moderate Complexity: 1 [place] OTR/L Acute Rehabilitation [phone] 03/31/2018, 1:00 PM

## 2018-03-31 NOTE — Plan of Care (Signed)
  Problem: Clinical Measurements: Goal: Respiratory complications will improve Outcome: Progressing   Problem: Education: Goal: Ability to describe self-care measures that may prevent or decrease complications (Diabetes Survival Skills Education) will improve Outcome: Progressing   Problem: Coping: Goal: Ability to adjust to condition or change in health will improve Outcome: Progressing   Problem: Fluid Volume: Goal: Ability to maintain a balanced intake and output will improve Outcome: Progressing - Lasix given   Problem: Metabolic: Goal: Ability to maintain appropriate glucose levels will improve Outcome: Progressing   Problem: Nutritional: Goal: Maintenance of adequate nutrition will improve Outcome: Progressing   Problem: Skin Integrity: Goal: Risk for impaired skin integrity will decrease Outcome: Progressing - Turning every 2 hours   Problem: Tissue Perfusion: Goal: Adequacy of tissue perfusion will improve Outcome: Progressing

## 2018-03-31 NOTE — Progress Notes (Signed)
Physical Therapy Treatment Patient Details Name: Terry Wu MRN: [id number] DOB: February 19, 1932 Today's Date: 03/31/2018    History of Present Illness 84 yo presenting as cardiac arrest at home s/p CPR and intubation 10/2-10/4. PMHx: Aortic aneurysm, chronic Afib, CAD s/p CABG/AVR 2006, HTN, type 2 DM, HLD, prostate cancer with radioactive seed implant, colon cancer s/p resection and colostomy 02/07/2017 with post-op CVA, COPD    PT Comments    Pt received in recliner agreeable to participation in therapy. Session concentrated on transfers, standing and LE exercises. Pt presents with decreased strength and poor activity tolerance. Pt is motivated to get stronger and return home with family.    Follow Up Recommendations  Home health PT;Supervision for mobility/OOB     Equipment Recommendations  3in1 (PT)    Recommendations for Other Services       Precautions / Restrictions Precautions Precautions: Fall;Other (comment) Precaution Comments: watch HR    Mobility  Bed Mobility               General bed mobility comments: Pt received in recliner.  Transfers Overall transfer level: Needs assistance Equipment used: Rolling walker (2 wheeled) Transfers: Sit to/from Stand Sit to Stand: Min assist         General transfer comment: cues for hand placement, assist to power up, increased time to stabilize initial standing balance  Ambulation/Gait                 Stairs             Wheelchair Mobility    Modified Rankin (Stroke Patients Only)       Balance                                            Cognition Arousal/Alertness: Awake/alert Behavior During Therapy: WFL for tasks assessed/performed Overall Cognitive Status: Within Functional Limits for tasks assessed                                        Exercises General Exercises - Lower Extremity Ankle Circles/Pumps: AROM;Both;20 reps;Seated Long Arc Quad:  AROM;Right;Left;20 reps;Seated Hip ABduction/ADduction: AROM;Both;Seated;10 reps Hip Flexion/Marching: AROM;Standing;Seated;Right;Left;20 reps(20 reps seated, 20 reps standing with RW) Heel Raises: AROM;Both;10 reps;Standing(with RW)    General Comments        Pertinent Vitals/Pain Pain Assessment: No/denies pain    Home Living                      Prior Function            PT Goals (current goals can now be found in the care plan section) Acute Rehab PT Goals Patient Stated Goal: return home  Time For Goal Achievement: 04/11/18 Potential to Achieve Goals: Good Progress towards PT goals: Progressing toward goals    Frequency    Min 3X/week      PT Plan Current plan remains appropriate    Co-evaluation              AM-PAC PT "6 Clicks" Daily Activity  Outcome Measure  Difficulty turning over in bed (including adjusting bedclothes, sheets and blankets)?: Unable Difficulty moving from lying on back to sitting on the side of the bed? : Unable Difficulty sitting down on and standing up from a  chair with arms (e.g., wheelchair, bedside commode, etc,.)?: Unable Help needed moving to and from a bed to chair (including a wheelchair)?: A Little Help needed walking in hospital room?: A Little Help needed climbing 3-5 steps with a railing? : A Lot 6 Click Score: 11    End of Session Equipment Utilized During Treatment: Gait belt;Oxygen Activity Tolerance: Patient tolerated treatment well Patient left: in chair;with call bell/phone within reach;with chair alarm set;with family/visitor present Nurse Communication: Mobility status PT Visit Diagnosis: Unsteadiness on feet (R26.81);Other abnormalities of gait and mobility (R26.89);History of falling (Z91.81);Muscle weakness (generalized) (M62.81)     Time: 8978-4784 PT Time Calculation (min) (ACUTE ONLY): 26 min  Charges:  $Therapeutic Exercise: 8-22 mins $Therapeutic Activity: 8-22 mins                      Lorrin Goodell, PT  Office # [phone] Pager [phone]    Lorriane Shire 03/31/2018, 12:28 PM

## 2018-03-31 NOTE — Progress Notes (Signed)
Patient noted to have scant bright red blood on foley catheter, appears to be from urethra. Foley care performed, text page sent to Dr. Maryland Pink with return call: No new orders received. Pt denies pain or discomfort. Will continue to monitor. Roselyn Reef Dosia Yodice,RN

## 2018-03-31 NOTE — Discharge Instructions (Signed)

## 2018-03-31 NOTE — Progress Notes (Signed)
[facility] PROGRESS NOTE  BROCKTON MCKESSON OYD:[id number] DOB: 05-24-1932 DOA: 04/18/2018  PCP: Sinda Du, MD  Brief History/Interval Summary: 84 year old Caucasian male with a past medical history of aortic aneurysm, chronic atrial fibrillation on Eliquis, coronary artery disease status post CABG and aortic valve replacement in 2006, essential hypertension, diabetes mellitus type 2, history of prostate cancer, history of colon cancer status post resection and colostomy in 6720 complicated by postoperative stroke, history of COPD presented after he passed out in front of his wife.  Apparently he was in cardiac arrest.  CPR was done on the patient.  He had return of spontaneous circulation in 5 minutes.  Apparently no ACLS medications were given.  However patient was intubated.  He was admitted to the intensive care unit.  He was stabilized in the ICU.  He was extubated.  Subsequently noted to have pulmonary edema.  Reason for Visit: Status post cardiac arrest.  Consultants: Critical care medicine was following.  Procedures:   Transthoracic echocardiogram Study Conclusions  - Left ventricle: The cavity size was normal. Wall thickness was   normal. Systolic function was normal. The estimated ejection   fraction was in the range of 60% to 65%. Wall motion was normal;   there were no regional wall motion abnormalities. - Aortic valve: Bioprosthetic aortic valve. No significant   bioprosthetic valve stenosis or regurgitation. Mean gradient (S):   8 mm Hg. - Mitral valve: Mildly calcified annulus. There was mild   regurgitation. - Left atrium: The atrium was moderately to severely dilated. - Right ventricle: The cavity size was normal. Systolic function   was mildly reduced. - Right atrium: The atrium was moderately to severely dilated. - Tricuspid valve: Peak RV-RA gradient (S): 35 mm Hg. - Pulmonary arteries: PA peak pressure: 43 mm Hg (S). - Systemic veins: IVC measured  2.5 cm with > 50% respirophasic   variation, suggesting RA pressure 8 mmHg.  Impressions:  - Normal LV size with EF 60-65%. Normal RV size with mildly   decreased systolic function. Normally functioning bioprosthetic   aortic valve. Mild pulmonary hypertension.  Antibiotics: Vancomycin for staph aureus in the sputum.  Subjective/Interval History: Patient remains distracted.  Denies any chest pain.  No nausea or vomiting.    ROS: Denies any headaches  Objective:  Vital Signs  Vitals:   03/31/18 0200 03/31/18 0500 03/31/18 0818 03/31/18 1126  BP: (!) 116/58 114/68  107/60  Pulse: 87 78  94  Resp: (!) 32 (!) 29  (!) 28  Temp:    97.7 F (36.5 C)  TempSrc:    Oral  SpO2: 98% 97% 98% 97%  Weight:  112.3 kg    Height:        Intake/Output Summary (Last 24 hours) at 03/31/2018 1128 Last data filed at 03/31/2018 1117 Gross per 24 hour  Intake 1005.43 ml  Output 2620 ml  Net -1614.57 ml   Filed Weights   03/29/18 0546 03/30/18 0600 03/31/18 0500  Weight: 111.1 kg 110.7 kg 112.3 kg    General appearance: Awake alert.  In no distress. Resp: Mildly tachypneic at rest.  No use of accessory muscles.  Coarse breath sounds bilaterally.  Continues to have crackles at the bases.  No wheezing or rhonchi. Cardio: S1-S2 is irregularly irregular.  No S3-S4.  No rubs murmurs or bruit.  Telemetry shows heart rate in the 100 -110 range. GI: Abdomen soft.  Nontender nondistended Extremities: Chronic skin changes with scaly skin noted in both lower  extremities with about 1+ edema. Neurologic: He is awake alert.  Oriented to place year person.  Slow to respond at times.  No obvious focal neurological deficits.  Lab Results:  Data Reviewed: I have personally reviewed following labs and imaging studies  CBC: Recent Labs  Lab 04/19/2018 1610  03/27/18 0410 03/28/18 0352 03/29/18 0240 03/30/18 0230 03/31/18 0420  WBC 11.2*  --  14.1* 12.4* 12.3* 11.5* 9.9  NEUTROABS 7.6  --   --   10.1*  --   --   --   HGB 10.2*   < > 11.0* 9.4* 9.3* 9.2* 9.3*  HCT 32.4*   < > 33.8* 28.9* 29.9* 29.9* 29.7*  MCV 93.9  --  90.1 90.3 92.9 93.4 94.0  PLT 214  --  196 189 166 174 168   < > = values in this interval not displayed.    Basic Metabolic Panel: Recent Labs  Lab 04/12/2018 2221 03/27/18 0410 03/28/18 0352 03/29/18 0240 03/30/18 0230 03/31/18 0420  NA 130* 129* 131* 132* 134* 134*  K 4.2 4.1 3.4* 3.4* 4.0 4.2  CL 89* 90* 92* 94* 95* 95*  CO2 31 28 28 30 31 31   GLUCOSE 134* 90 95 152* 127* 152*  BUN 16 16 13 11 11 11   CREATININE 0.92 0.91 0.96 0.86 0.81 0.85  CALCIUM 9.0 8.5* 8.1* 7.8* 8.2* 8.2*  MG 2.0 1.9 1.8 2.1  --   --   PHOS 3.5 3.3 3.1 3.5  --   --     GFR: Estimated Creatinine Clearance: 79.9 mL/min (by C-G formula based on SCr of 0.85 mg/dL).  Liver Function Tests: Recent Labs  Lab 04/21/2018 1610 04/08/2018 2221 03/27/18 0410 03/29/18 0240  AST 58*  --   --  22  ALT 50*  --   --  33  ALKPHOS 37*  --   --  32*  BILITOT 0.9  --   --  0.9  PROT 6.6  --   --  5.8*  ALBUMIN 2.7* 2.8* 2.7* 2.4*     Recent Labs  Lab 03/27/18 0617  AMMONIA 35    Coagulation Profile: Recent Labs  Lab 03/28/2018 1610  INR 2.01    Cardiac Enzymes: Recent Labs  Lab 04/04/2018 1610 03/28/2018 2221 03/27/18 0410 03/27/18 1028  TROPONINI 0.03* 0.10* 0.09* 0.06*    CBG: Recent Labs  Lab 03/30/18 0702 03/30/18 1142 03/30/18 1623 03/30/18 2116 03/31/18 0819  GLUCAP 99 137* 192* 212* 129*     Recent Results (from the past 240 hour(s))  Culture, blood (routine x 2)     Status: None   Collection Time: 03/29/2018  6:16 PM  Result Value Ref Range Status   Specimen Description BLOOD RIGHT ARM  Final   Special Requests   Final    BOTTLES DRAWN AEROBIC AND ANAEROBIC Blood Culture adequate volume   Culture   Final    NO GROWTH 5 DAYS Performed at [facility], [place] [address], [place], [place] [address]    Report Status 03/31/2018 FINAL  Final  MRSA PCR  Screening     Status: None   Collection Time: 03/25/2018  9:50 PM  Result Value Ref Range Status   MRSA by PCR NEGATIVE NEGATIVE Final    Comment:        The GeneXpert MRSA Assay (FDA approved for NASAL specimens only), is one component of a comprehensive MRSA colonization surveillance program. It is not intended to diagnose MRSA infection nor to guide  or monitor treatment for MRSA infections. Performed at [facility], [place] [address] [place], [place] [address]   Culture, blood (routine x 2)     Status: None   Collection Time: 04/17/2018 10:15 PM  Result Value Ref Range Status   Specimen Description BLOOD RIGHT HAND  Final   Special Requests   Final    BOTTLES DRAWN AEROBIC ONLY Blood Culture adequate volume   Culture   Final    NO GROWTH 5 DAYS Performed at [facility], [place] [address], [place], [place] [address]    Report Status 03/31/2018 FINAL  Final  Culture, respiratory (non-expectorated)     Status: None   Collection Time: 03/27/18 11:29 AM  Result Value Ref Range Status   Specimen Description TRACHEAL ASPIRATE  Final   Special Requests NONE  Final   Gram Stain   Final    NO SQUAMOUS EPITHELIAL CELLS PRESENT ABUNDANT WBC PRESENT, PREDOMINANTLY PMN RARE GRAM POSITIVE COCCI Performed at [facility], [place] [address], [place], [place] [address]    Culture FEW STAPHYLOCOCCUS AUREUS  Final   Report Status 03/29/2018 FINAL  Final   Organism ID, Bacteria STAPHYLOCOCCUS AUREUS  Final      Susceptibility   Staphylococcus aureus - MIC*    CIPROFLOXACIN <=0.5 SENSITIVE Sensitive     ERYTHROMYCIN >=8 RESISTANT Resistant     GENTAMICIN <=0.5 SENSITIVE Sensitive     OXACILLIN 0.5 SENSITIVE Sensitive     TETRACYCLINE <=1 SENSITIVE Sensitive     VANCOMYCIN 1 SENSITIVE Sensitive     TRIMETH/SULFA <=10 SENSITIVE Sensitive     CLINDAMYCIN <=0.25 SENSITIVE Sensitive     RIFAMPIN <=0.5 SENSITIVE Sensitive     Inducible Clindamycin NEGATIVE Sensitive     * FEW  STAPHYLOCOCCUS AUREUS      Radiology Studies: Dg Chest Port 1 View  Result Date: 03/30/2018 CLINICAL DATA:  Post code and CPR. EXAM: PORTABLE CHEST 1 VIEW COMPARISON:  03/29/2018 FINDINGS: Prior CABG and aortic valve replacement. Cardiomegaly. Bilateral perihilar and lower lobe opacities with small to moderate layering effusions. No significant change since prior study. IMPRESSION: No interval change. Electronically Signed   By: Rolm Baptise M.D.   On: 03/30/2018 10:56     Medications:  Scheduled: . apixaban  5 mg Oral BID  . carvedilol  3.125 mg Oral BID WC  . chlorhexidine  15 mL Mouth Rinse BID  . doxycycline  100 mg Oral Q12H  . feeding supplement (PRO-STAT SUGAR FREE 64)  30 mL Oral BID  . furosemide  60 mg Intravenous Q8H  . insulin aspart  0-15 Units Subcutaneous TID WC  . ipratropium-albuterol  3 mL Nebulization BID  . mouth rinse  15 mL Mouth Rinse q12n4p  . pantoprazole  40 mg Oral Daily   Continuous:  IDP:OEUMPNTIR, docusate, ipratropium-albuterol  Assessment/Plan:    Syncope along with cardiac arrest/history of coronary artery disease/acute diastolic CHF Reason for his presentation is not entirely clear.  His troponin peaked at 0.1.  So not a significant elevation.  He did get CPR as well.  EKG showed atrial fibrillation.  No ischemic changes were noted.  Patient does have a history of coronary artery disease status post CABG and aortic valve replacement.  Echocardiogram showed normal systolic function.  No wall motion abnormalities. Chest x-ray did show pulmonary edema so the patient was placed on IV Lasix.  He has been diuresing however his weight is not decreasing much.  He remains somewhat symptomatic.  X-ray was repeated and continues to show pulmonary edema.  We will increase the dose of his Lasix today.  Continue strict ins and outs and daily weights.  If he does not improve with this treatment may have to involve cardiology.  Acute respiratory failure with  hypoxia/positive sputum culture for staph aureus/possible pneumonia He had to be intubated due to cardiac arrest.  He was extubated successfully.  He is noted to have fluid overload.  However respiratory status is stable.  Continue diuretics.  Dose will be increased today. Lactic acid was elevated at 3.43 and improved to 1.81 subsequently.  Procalcitonin was asked to be 0.11.  Sputum cultures with few staph aureus.  Apparently he had low-grade fever.  He was started on vancomycin by pulmonology.  Culture positive for MSSA.  Sensitivities were reviewed.  He was changed over to doxycycline.    History of COPD Stable.  Chronic atrial fibrillation Continue to monitor on telemetry.  Continue carvedilol.  He was initially on IV heparin and was transitioned back to Eliquis which he was taking at home prior to admission.    History of aortic valve replacement Echocardiogram showed normal functioning bioprosthetic aortic valve.  Hyponatremia and hypokalemia Sodium is low but stable.  Likely due to volume overload.  Potassium level has improved with supplementation.    Normocytic anemia No overt bleeding.  Hemoglobin remains stable.  Anemia panel reviewed.  Ferritin 129.  TIBC 246.  Folate 19.4.  Vitamin B12 706.  Diabetes mellitus type 2 CBGs are reasonably well controlled.  Essential hypertension Blood pressure remains stable.  DVT Prophylaxis: Apixaban    Code Status: Partial code Family Communication: No family at bedside.  Discussed with his wife yesterday. Disposition Plan: Continue treatment as outlined above.  PT and OT evaluation.    LOS: 5 days   [facility] [facility] Pager [phone] 03/31/2018, 11:28 AM  If 7PM-7AM, please contact night-coverage at [email or web address], password [facility]

## 2018-04-01 ENCOUNTER — Inpatient Hospital Stay (HOSPITAL_COMMUNITY): Payer: Medicare Other

## 2018-04-01 DIAGNOSIS — J9602 Acute respiratory failure with hypercapnia: Secondary | ICD-10-CM

## 2018-04-01 DIAGNOSIS — J15211 Pneumonia due to Methicillin susceptible Staphylococcus aureus: Secondary | ICD-10-CM

## 2018-04-01 LAB — BASIC METABOLIC PANEL
ANION GAP: 10 (ref 5–15)
BUN: 14 mg/dL (ref 8–23)
CALCIUM: 8.6 mg/dL — AB (ref 8.9–10.3)
CO2: 33 mmol/L — ABNORMAL HIGH (ref 22–32)
Chloride: 92 mmol/L — ABNORMAL LOW (ref 98–111)
Creatinine, Ser: 0.92 mg/dL (ref 0.61–1.24)
GFR calc non Af Amer: >60 mL/min (ref >60)
Glucose, Bld: 144 mg/dL — ABNORMAL HIGH (ref 70–99)
Potassium: 4.2 mmol/L (ref 3.5–5.1)
SODIUM: 135 mmol/L (ref 135–145)

## 2018-04-01 LAB — BLOOD GAS, ARTERIAL
ACID-BASE EXCESS: 11.7 mmol/L — AB (ref 0.0–2.0)
ACID-BASE EXCESS: 12.9 mmol/L — AB (ref 0.0–2.0)
Bicarbonate: 38.6 mmol/L — ABNORMAL HIGH (ref 20.0–28.0)
Bicarbonate: 38.7 mmol/L — ABNORMAL HIGH (ref 20.0–28.0)
DELIVERY SYSTEMS: POSITIVE
DRAWN BY: 257081
DRAWN BY: 53309
EXPIRATORY PAP: 8
FIO2: 40
INSPIRATORY PAP: 16
LHR: 10 {breaths}/min
O2 CONTENT: 3 L/min
O2 SAT: 98.7 %
O2 SAT: 98.5 %
PATIENT TEMPERATURE: 98.6
PH ART: 7.367 (ref 7.350–7.450)
Patient temperature: 98.6
pCO2 arterial: 85.6 mmHg (ref 32.0–48.0)
pCO2 arterial: 69.1 mmHg (ref 32.0–48.0)
pH, Arterial: 7.277 — ABNORMAL LOW (ref 7.350–7.450)
pO2, Arterial: 131 mmHg — ABNORMAL HIGH (ref 83.0–108.0)
pO2, Arterial: 110 mmHg — ABNORMAL HIGH (ref 83.0–108.0)

## 2018-04-01 LAB — GLUCOSE, CAPILLARY
GLUCOSE-CAPILLARY: 117 mg/dL — AB (ref 70–99)
Glucose-Capillary: 141 mg/dL — ABNORMAL HIGH (ref 70–99)
Glucose-Capillary: 110 mg/dL — ABNORMAL HIGH (ref 70–99)
Glucose-Capillary: 139 mg/dL — ABNORMAL HIGH (ref 70–99)

## 2018-04-01 MED ORDER — SODIUM CHLORIDE 0.9 % IV SOLN
100.0000 mg | Freq: Two times a day (BID) | INTRAVENOUS | Status: DC
  Administered 2018-04-01 – 2018-04-02 (×4): 100 mg via INTRAVENOUS
  Filled 2018-04-01 (×5): qty 100

## 2018-04-01 NOTE — Progress Notes (Addendum)
1326PT Cancellation Note  Patient Details Name: Terry Wu MRN: [id number] DOB: 06-02-1932   Cancelled Treatment:    Reason Eval/Treat Not Completed: Medical issues which prohibited therapy;Fatigue/lethargy limiting ability to participate(Pt resting on continuous CPAP due to elevated CO2 levels.  Family at bed side and deferred bed level tx at this time.  Will continue efforts per POC.  )   Mikhia Dusek Eli Hose 04/01/2018, 3:39 PM Governor Rooks, PTA Acute Rehabilitation Services Pager [phone] Office [phone]

## 2018-04-01 NOTE — Progress Notes (Signed)
CRITICAL VALUE ALERT  Critical Value:  Pco2 69.1 Date & Time Notied:  04/01/2018 1250  Provider Notified: Dr Maryland Pink Orders Received/Actions taken: MD made ware will continue on Bipap at this time

## 2018-04-01 NOTE — Significant Event (Signed)
Rapid Response Event Note  Overview: Time Called: 0957 Arrival Time: 1000 Event Type: Neurologic, Respiratory  Initial Focused Assessment: Patient difficult to arouse this morning. Opens eyes to stimulation.  Lung sounds decreased bases. Irregular HR  BP  128/77   HR 103  RR 36  O2 sat 100% on 3L [place]  Interventions: ABG done Oral care done, encouraged to cough and deep breath. After repeated stimulation he now is able to hold his eyes open and attempts to cough but is unable. occ verbal out put.  ABG 7.27/85/131/38, notified MD of results and patient status. RT placed patient on Bipap 16/8  Fio2 40% Will obtain repeat ABG in a couple of hours.  Plan of Care (if not transferred): RN to call if patient declines or does not improve. Will FU after repeat ABG.  Event Summary: Name of Physician Notified: [place] Pink at      at    Outcome: Stayed in room and stabalized     Lilyan Punt, [place]

## 2018-04-01 NOTE — Progress Notes (Addendum)
[facility] PROGRESS NOTE  Terry Wu ZJQ:[id number] DOB: 1932-05-10 DOA: 03/29/2018  PCP: Sinda Du, MD  Brief History/Interval Summary: 84 year old Caucasian male with a past medical history of aortic aneurysm, chronic atrial fibrillation on Eliquis, coronary artery disease status post CABG and aortic valve replacement in 2006, essential hypertension, diabetes mellitus type 2, history of prostate cancer, history of colon cancer status post resection and colostomy in 2409 complicated by postoperative stroke, history of COPD presented after he passed out in front of his wife.  Apparently he was in cardiac arrest.  CPR was done on the patient.  He had return of spontaneous circulation in 5 minutes.  Apparently no ACLS medications were given.  However patient was intubated.  He was admitted to the intensive care unit.  He was stabilized in the ICU.  He was extubated.  Subsequently noted to have pulmonary edema.  Patient was started on IV diuretics.  This morning he was noted to be very somnolent.  ABG was done with acute respiratory acidosis.  Will be placed on BiPAP.  Reason for Visit: Status post cardiac arrest.  Consultants: Critical care medicine was following.  Procedures:   Transthoracic echocardiogram Study Conclusions  - Left ventricle: The cavity size was normal. Wall thickness was   normal. Systolic function was normal. The estimated ejection   fraction was in the range of 60% to 65%. Wall motion was normal;   there were no regional wall motion abnormalities. - Aortic valve: Bioprosthetic aortic valve. No significant   bioprosthetic valve stenosis or regurgitation. Mean gradient (S):   8 mm Hg. - Mitral valve: Mildly calcified annulus. There was mild   regurgitation. - Left atrium: The atrium was moderately to severely dilated. - Right ventricle: The cavity size was normal. Systolic function   was mildly reduced. - Right atrium: The atrium was moderately to  severely dilated. - Tricuspid valve: Peak RV-RA gradient (S): 35 mm Hg. - Pulmonary arteries: PA peak pressure: 43 mm Hg (S). - Systemic veins: IVC measured 2.5 cm with > 50% respirophasic   variation, suggesting RA pressure 8 mmHg.  Impressions:  - Normal LV size with EF 60-65%. Normal RV size with mildly   decreased systolic function. Normally functioning bioprosthetic   aortic valve. Mild pulmonary hypertension.  Antibiotics: Vancomycin for staph aureus in the sputum.  Subjective/Interval History: Patient noted to be very somnolent.  Not easily arousable.  Vital signs are stable.  ROS: Unable to do this morning.  Objective:  Vital Signs  Vitals:   04/01/18 0800 04/01/18 0805 04/01/18 1047 04/01/18 1049  BP: 128/70     Pulse: 86 85 (!) 103 (!) 103  Resp: (!) 28 (!) 24 (!) 29 (!) 36  Temp: 98.4 F (36.9 C)     TempSrc: Axillary     SpO2: 98% 98% 98% 98%  Weight:      Height:        Intake/Output Summary (Last 24 hours) at 04/01/2018 1114 Last data filed at 04/01/2018 0327 Gross per 24 hour  Intake -  Output 3600 ml  Net -3600 ml   Filed Weights   03/29/18 0546 03/30/18 0600 03/31/18 0500  Weight: 111.1 kg 110.7 kg 112.3 kg    General appearance: Somnolent.  Resp: Noted to be tachypneic.  Using accessory muscles.  Coarse breath sounds bilaterally.  Crackles at the bases.  No wheezing or rhonchi.   Cardio: S1-S2 is irregularly irregular.  No S3-S4.  No rubs murmurs or bruit.  Telemetry shows heart rate in the 100-110 range.   GI: Abdomen remains soft.  Nontender nondistended. Extremities: Chronic skin changes since the lower extremity with scaly skin.  Edema appears to have improved slightly. Neurologic: Patient is somnolent this morning.  Does follow certain commands  Lab Results:  Data Reviewed: I have personally reviewed following labs and imaging studies  CBC: Recent Labs  Lab 03/30/2018 1610  03/27/18 0410 03/28/18 0352 03/29/18 0240 03/30/18 0230  03/31/18 0420  WBC 11.2*  --  14.1* 12.4* 12.3* 11.5* 9.9  NEUTROABS 7.6  --   --  10.1*  --   --   --   HGB 10.2*   < > 11.0* 9.4* 9.3* 9.2* 9.3*  HCT 32.4*   < > 33.8* 28.9* 29.9* 29.9* 29.7*  MCV 93.9  --  90.1 90.3 92.9 93.4 94.0  PLT 214  --  196 189 166 174 168   < > = values in this interval not displayed.    Basic Metabolic Panel: Recent Labs  Lab 03/28/2018 2221 03/27/18 0410 03/28/18 0352 03/29/18 0240 03/30/18 0230 03/31/18 0420 04/01/18 0237  NA 130* 129* 131* 132* 134* 134* 135  K 4.2 4.1 3.4* 3.4* 4.0 4.2 4.2  CL 89* 90* 92* 94* 95* 95* 92*  CO2 31 28 28 30 31 31  33*  GLUCOSE 134* 90 95 152* 127* 152* 144*  BUN 16 16 13 11 11 11 14   CREATININE 0.92 0.91 0.96 0.86 0.81 0.85 0.92  CALCIUM 9.0 8.5* 8.1* 7.8* 8.2* 8.2* 8.6*  MG 2.0 1.9 1.8 2.1  --   --   --   PHOS 3.5 3.3 3.1 3.5  --   --   --     GFR: Estimated Creatinine Clearance: 73.8 mL/min (by C-G formula based on SCr of 0.92 mg/dL).  Liver Function Tests: Recent Labs  Lab 04/20/2018 1610 04/17/2018 2221 03/27/18 0410 03/29/18 0240  AST 58*  --   --  22  ALT 50*  --   --  33  ALKPHOS 37*  --   --  32*  BILITOT 0.9  --   --  0.9  PROT 6.6  --   --  5.8*  ALBUMIN 2.7* 2.8* 2.7* 2.4*     Recent Labs  Lab 03/27/18 0617  AMMONIA 35    Coagulation Profile: Recent Labs  Lab 04/04/2018 1610  INR 2.01    Cardiac Enzymes: Recent Labs  Lab 04/02/2018 1610 04/06/2018 2221 03/27/18 0410 03/27/18 1028  TROPONINI 0.03* 0.10* 0.09* 0.06*    CBG: Recent Labs  Lab 03/31/18 0819 03/31/18 1226 03/31/18 1743 03/31/18 2042 04/01/18 0757  GLUCAP 129* 178* 161* 160* 141*     Recent Results (from the past 240 hour(s))  Culture, blood (routine x 2)     Status: None   Collection Time: 04/13/2018  6:16 PM  Result Value Ref Range Status   Specimen Description BLOOD RIGHT ARM  Final   Special Requests   Final    BOTTLES DRAWN AEROBIC AND ANAEROBIC Blood Culture adequate volume   Culture   Final    NO  GROWTH 5 DAYS Performed at [facility], [place] [address] [place], [place] [address]    Report Status 03/31/2018 FINAL  Final  MRSA PCR Screening     Status: None   Collection Time: 04/02/2018  9:50 PM  Result Value Ref Range Status   MRSA by PCR NEGATIVE NEGATIVE Final    Comment:  The GeneXpert MRSA Assay (FDA approved for NASAL specimens only), is one component of a comprehensive MRSA colonization surveillance program. It is not intended to diagnose MRSA infection nor to guide or monitor treatment for MRSA infections. Performed at [facility], [place] [address] [place], [place] [address]   Culture, blood (routine x 2)     Status: None   Collection Time: 03/29/2018 10:15 PM  Result Value Ref Range Status   Specimen Description BLOOD RIGHT HAND  Final   Special Requests   Final    BOTTLES DRAWN AEROBIC ONLY Blood Culture adequate volume   Culture   Final    NO GROWTH 5 DAYS Performed at [facility], [place] [address], [place], [place] [address]    Report Status 03/31/2018 FINAL  Final  Culture, respiratory (non-expectorated)     Status: None   Collection Time: 03/27/18 11:29 AM  Result Value Ref Range Status   Specimen Description TRACHEAL ASPIRATE  Final   Special Requests NONE  Final   Gram Stain   Final    NO SQUAMOUS EPITHELIAL CELLS PRESENT ABUNDANT WBC PRESENT, PREDOMINANTLY PMN RARE GRAM POSITIVE COCCI Performed at [facility], [place] [address], [place], [place] [address]    Culture FEW STAPHYLOCOCCUS AUREUS  Final   Report Status 03/29/2018 FINAL  Final   Organism ID, Bacteria STAPHYLOCOCCUS AUREUS  Final      Susceptibility   Staphylococcus aureus - MIC*    CIPROFLOXACIN <=0.5 SENSITIVE Sensitive     ERYTHROMYCIN >=8 RESISTANT Resistant     GENTAMICIN <=0.5 SENSITIVE Sensitive     OXACILLIN 0.5 SENSITIVE Sensitive     TETRACYCLINE <=1 SENSITIVE Sensitive     VANCOMYCIN 1 SENSITIVE Sensitive     TRIMETH/SULFA <=10 SENSITIVE Sensitive      CLINDAMYCIN <=0.25 SENSITIVE Sensitive     RIFAMPIN <=0.5 SENSITIVE Sensitive     Inducible Clindamycin NEGATIVE Sensitive     * FEW STAPHYLOCOCCUS AUREUS      Radiology Studies: Dg Chest Port 1 View  Result Date: 04/01/2018 CLINICAL DATA:  Dyspnea EXAM: PORTABLE CHEST 1 VIEW COMPARISON:  03/30/2018 FINDINGS: Cardiac shadow remains enlarged. Postsurgical changes are again seen. Bilateral pleural effusions are noted increasing on the right when compared with the prior exam. Persistent perihilar opacities are noted. No new focal abnormality is seen. IMPRESSION: Increasing right-sided pleural effusion. The remainder of the exam is stable from the prior study. Electronically Signed   By: Inez Catalina M.D.   On: 04/01/2018 09:53     Medications:  Scheduled: . apixaban  5 mg Oral BID  . carvedilol  3.125 mg Oral BID WC  . chlorhexidine  15 mL Mouth Rinse BID  . doxycycline  100 mg Oral Q12H  . feeding supplement (PRO-STAT SUGAR FREE 64)  30 mL Oral BID  . furosemide  60 mg Intravenous Q8H  . insulin aspart  0-15 Units Subcutaneous TID WC  . ipratropium-albuterol  3 mL Nebulization BID  . mouth rinse  15 mL Mouth Rinse q12n4p  . pantoprazole  40 mg Oral Daily   Continuous:  LZJ:[id number], docusate, ipratropium-albuterol  Assessment/Plan:   Acute respiratory failure with hypercapnia Patient noted to be very somnolent this morning.  He was reevaluated after hour and still not very responsive.  Chest x-ray shows increasing pleural effusion on the right but no other acute findings.  ABG was done which showed a pH of 7.27 with a PCO2 of 85 PO2 of 131.  Patient  to be placed on BiPAP.  He is a partial code but apparently okay to reintubate if needed but hopefully we can avoid that by using just noninvasive means.  ABG to be repeated after an hour on BiPAP.  Syncope along with cardiac arrest/history of coronary artery disease/acute diastolic CHF Reason for his presentation is not entirely  clear.  His troponin peaked at 0.1.  So not a significant elevation.  He did get CPR as well.  EKG showed atrial fibrillation.  No ischemic changes were noted.  Patient does have a history of coronary artery disease status post CABG and aortic valve replacement.  Echocardiogram showed normal systolic function.  No wall motion abnormalities. Chest x-ray did show pulmonary edema so the patient was placed on IV Lasix.  Patient has been diuresing.  But his rate was not decreasing.  Dose of Lasix was increased yesterday with good urine output.  Weight not checked this morning.  See above as well.  To be placed on BiPAP.  Continue intravenous Lasix.  Continue strict ins and outs and daily weights.  Worsening right-sided pleural effusion noted.  May need thoracentesis if does not improve with diuretics.    Acute respiratory failure with hypoxia/positive sputum culture for staph aureus/possible pneumonia He was intubated due to cardiac arrest.  He was extubated successfully.  Partial code but okay to reintubate if needed.  See above as well.  Remains on diuretics and antibiotics.  He was initially on vancomycin.  Sputum culture positive for MSSA.  He was changed over to doxycycline.  We will change to intravenous for now.  History of COPD See above.  Chronic atrial fibrillation Continue to monitor on telemetry.  Continue carvedilol.  He was initially on IV heparin and was transitioned back to Eliquis which he was taking at home prior to admission.    History of aortic valve replacement Echocardiogram showed normal functioning bioprosthetic aortic valve.  Hyponatremia and hypokalemia Odium level is stable.  Hyponatremia was most likely due to volume overload.  Potassium level is normal this morning.     Normocytic anemia No overt bleeding.  Hemoglobin remains stable.  Anemia panel reviewed.  Ferritin 129.  TIBC 246.  Folate 19.4.  Vitamin B12 706.  Diabetes mellitus type 2 CBGs are reasonably well  controlled.  Essential hypertension Blood pressure remains stable.  ADDENDUM Patient reevaluated at 2:45PM.  Patient noted to be awake.  Try to communicate with his wife.  He waved at me.  ABG was repeated and shows improvement in PCO2.  Continue BiPAP for now.  Could take it off in 2 hours to see how he tolerates.  But would still leave him on the BiPAP over the course of the night tonight.  CODE STATUS this again discussed with the family members.  They do not want the patient intubated.  Noninvasive positive pressure ventilation is okay.  We will change the CODE STATUS order.  Continue to monitor closely.  DVT Prophylaxis: Apixaban    Code Status: Partial code Family Communication: No family at bedside.  Tried calling his wife with no success.  We will try again later. Disposition Plan: Management as outlined above.  We will reevaluate him later today.    LOS: 6 days   [facility] Pager [phone] 04/01/2018, 11:14 AM  If 7PM-7AM, please contact night-coverage at [email or web address], password [facility]

## 2018-04-02 ENCOUNTER — Inpatient Hospital Stay (HOSPITAL_COMMUNITY): Payer: Medicare Other

## 2018-04-02 DIAGNOSIS — J9601 Acute respiratory failure with hypoxia: Secondary | ICD-10-CM

## 2018-04-02 DIAGNOSIS — R040 Epistaxis: Secondary | ICD-10-CM

## 2018-04-02 DIAGNOSIS — I5033 Acute on chronic diastolic (congestive) heart failure: Secondary | ICD-10-CM

## 2018-04-02 DIAGNOSIS — I469 Cardiac arrest, cause unspecified: Principal | ICD-10-CM

## 2018-04-02 LAB — CBC
HCT: 28.1 % — ABNORMAL LOW (ref 39.0–52.0)
HEMOGLOBIN: 8.5 g/dL — AB (ref 13.0–17.0)
MCH: 28.2 pg (ref 26.0–34.0)
MCHC: 30.2 g/dL (ref 30.0–36.0)
MCV: 93.4 fL (ref 80.0–100.0)
Platelets: 163 10*3/uL (ref 150–400)
RBC: 3.01 MIL/uL — AB (ref 4.22–5.81)
RDW: 14.6 % (ref 11.5–15.5)
WBC: 6.9 10*3/uL (ref 4.0–10.5)
nRBC: 0 % (ref 0.0–0.2)

## 2018-04-02 LAB — GLUCOSE, CAPILLARY
GLUCOSE-CAPILLARY: 177 mg/dL — AB (ref 70–99)
GLUCOSE-CAPILLARY: 176 mg/dL — AB (ref 70–99)
GLUCOSE-CAPILLARY: 212 mg/dL — AB (ref 70–99)
Glucose-Capillary: 101 mg/dL — ABNORMAL HIGH (ref 70–99)

## 2018-04-02 LAB — BASIC METABOLIC PANEL
ANION GAP: 9 (ref 5–15)
BUN: 18 mg/dL (ref 8–23)
CO2: 35 mmol/L — ABNORMAL HIGH (ref 22–32)
Calcium: 8.4 mg/dL — ABNORMAL LOW (ref 8.9–10.3)
Chloride: 93 mmol/L — ABNORMAL LOW (ref 98–111)
Creatinine, Ser: 0.96 mg/dL (ref 0.61–1.24)
GLUCOSE: 118 mg/dL — AB (ref 70–99)
POTASSIUM: 3.7 mmol/L (ref 3.5–5.1)
SODIUM: 137 mmol/L (ref 135–145)

## 2018-04-02 MED ORDER — OXYMETAZOLINE HCL 0.05 % NA SOLN
1.0000 | Freq: Two times a day (BID) | NASAL | Status: AC
  Administered 2018-04-02 – 2018-04-03 (×3): 1 via NASAL
  Filled 2018-04-02: qty 15

## 2018-04-02 NOTE — Progress Notes (Signed)
Occupational Therapy Treatment Patient Details Name: Terry Wu MRN: [id number] DOB: 10-18-1931 Today's Date: 04/02/2018    History of present illness 84 yo presenting as cardiac arrest at home s/p CPR and intubation 10/2-10/4. PMHx: Aortic aneurysm, chronic Afib, CAD s/p CABG/AVR 2006, HTN, type 2 DM, HLD, prostate cancer with radioactive seed implant, colon cancer s/p resection and colostomy 02/07/2017 with post-op CVA, COPD   OT comments  Pt eager to get OOB. Able to ambulate to door with RW and min assist, second person for safety. Performed UB dressing and grooming with min assist. Pt with stable VS on 02.   Follow Up Recommendations  Home health OT;Supervision/Assistance - 24 hour    Equipment Recommendations  None recommended by OT    Recommendations for Other Services      Precautions / Restrictions Precautions Precautions: Fall Precaution Comments: watch HR       Mobility Bed Mobility Overal bed mobility: Needs Assistance Bed Mobility: Supine to Sit     Supine to sit: Mod assist;HOB elevated     General bed mobility comments: assist for hips to EOB  Transfers Overall transfer level: Needs assistance Equipment used: Rolling walker (2 wheeled) Transfers: Sit to/from Stand Sit to Stand: Min assist;+2 safety/equipment         General transfer comment: cues for hand placement, min assist to rise and steady    Balance Overall balance assessment: Needs assistance   Sitting balance-Leahy Scale: Fair       Standing balance-Leahy Scale: Poor Standing balance comment: requires B UE support of walker                           ADL either performed or assessed with clinical judgement   ADL Overall ADL's : Needs assistance/impaired     Grooming: Wash/dry face;Brushing hair;Sitting;Set up Grooming Details (indicate cue type and reason): jerking movement of R UE with face washing         Upper Body Dressing : Minimal assistance;Sitting Upper  Body Dressing Details (indicate cue type and reason): front opening gown Lower Body Dressing: Total assistance;Bed level Lower Body Dressing Details (indicate cue type and reason): pt is dependent at baseline             Functional mobility during ADLs: Minimal assistance;Rolling walker;+2 for safety/equipment General ADL Comments: pt readily willing to get OOB, experiencing nose bleed     Vision       Perception     Praxis      Cognition Arousal/Alertness: Awake/alert Behavior During Therapy: Flat affect Overall Cognitive Status: Difficult to assess                                          Exercises     Shoulder Instructions       General Comments      Pertinent Vitals/ Pain       Pain Assessment: Faces Faces Pain Scale: No hurt  Home Living                                          Prior Functioning/Environment              Frequency  Min 2X/week        Progress  Toward Goals  OT Goals(current goals can now be found in the care plan section)  Progress towards OT goals: Progressing toward goals  Acute Rehab OT Goals Patient Stated Goal: to be able to go home. OT Goal Formulation: With patient Time For Goal Achievement: 04/14/18 Potential to Achieve Goals: Good  Plan Discharge plan remains appropriate    Co-evaluation    PT/OT/SLP Co-Evaluation/Treatment: Yes Reason for Co-Treatment: For patient/therapist safety   OT goals addressed during session: ADL's and self-care      AM-PAC PT "6 Clicks" Daily Activity     Outcome Measure   Help from another person eating meals?: None Help from another person taking care of personal grooming?: A Little Help from another person toileting, which includes using toliet, bedpan, or urinal?: A Lot Help from another person bathing (including washing, rinsing, drying)?: A Lot Help from another person to put on and taking off regular upper body clothing?: A Little Help  from another person to put on and taking off regular lower body clothing?: Total 6 Click Score: 15    End of Session Equipment Utilized During Treatment: Gait belt;Rolling walker;Oxygen  OT Visit Diagnosis: Unsteadiness on feet (R26.81);Muscle weakness (generalized) (M62.81)   Activity Tolerance Patient limited by fatigue   Patient Left in chair;with call bell/phone within reach;with chair alarm set;with family/visitor present   Nurse Communication          Time: 8099-8338 OT Time Calculation (min): 23 min  Charges: OT General Charges $OT Visit: 1 Visit OT Treatments $Therapeutic Activity: 8-22 mins  Nestor Lewandowsky, OTR/L Acute Rehabilitation Services Pager: [phone] Office: [phone]   Malka So 04/02/2018, 11:19 AM

## 2018-04-02 NOTE — Progress Notes (Signed)
RT removed patient from BIPAP and placed on 3lnc. Patient had dried blood coming from nose and on face. RT cleaned patient's face and nose and replaced bipap mask. Wife at bedside. RT will continue to monitor.

## 2018-04-02 NOTE — Consult Note (Signed)
Reason for Consult: Epistaxis Referring Physician: Oren Binet, MD  HPI:  Terry Wu is an 84 y.o. male with new onset right-sided epistaxis since this morning. He is on eliquis for his afib. He was recently admitted after a cardiac arrest. He has a past medical history of CAD status post CABG/aortic valve replacement in 2006, history of colon cancer status post resection/colostomy in 2018, stroke, and afib. He was admitted to the hospital on 10/2 following a cardiac arrest with return of spontaneous circulation in approximately 5 minutes.  No shocks were administered.  Patient was intubated in the emergency room for airway protection, subsequently managed in the intensive care unit stabilized and transferred to the hospitalist service.  Subsequent hospital course has been complicated by hypercarbic respiratory failure, decompensated diastolic heart failure, and now epistaxis and hematuria. The patient has been experiencing episodes of severe right sided bleeding since this morning. Afrin and local pressure could not stop the bleeding.  Past Medical History:  Diagnosis Date  . Cancer ([place])   . COPD (chronic obstructive pulmonary disease) ([place])   . Diabetes mellitus without complication ([place])   . Hypertension     Past Surgical History:  Procedure Laterality Date  . COLON SURGERY    . CORONARY ARTERY BYPASS GRAFT      History reviewed. No pertinent family history.  Social History:  reports that he has never smoked. He has never used smokeless tobacco. He reports that he drank alcohol. He reports that he does not use drugs.  Allergies:  Allergies  Allergen Reactions  . Asa [Aspirin] Anaphylaxis and Swelling    Throat swells   . Demerol [Meperidine Hcl] Shortness Of Breath, Swelling and Other (See Comments)    "Suppresses vagus nerve" (occurred during a procedure)    Prior to Admission medications   Medication Sig Start Date End Date Taking? Authorizing Provider  amoxicillin  (AMOXIL) 500 MG capsule Take 2,000 mg by mouth See admin instructions. Take 2,000 mg by mouth one hour prior to procedure(s), then as directed 01/18/18  Yes [provider]  atorvastatin (LIPITOR) 20 MG tablet Take 20 mg by mouth every evening.  01/04/18  Yes [provider]  carvedilol (COREG) 3.125 MG tablet Take 3.125 mg by mouth 2 (two) times daily with a meal. 03/24/18  Yes [provider]  ELIQUIS 5 MG TABS tablet Take 5 mg by mouth 2 (two) times daily. 03/07/18  Yes [provider]  furosemide (LASIX) 40 MG tablet Take 40 mg by mouth 2 (two) times daily. 12/24/17  Yes [provider]  glimepiride (AMARYL) 2 MG tablet Take 1 mg by mouth daily with breakfast.   Yes [provider]  Multiple Vitamins-Minerals (PRESERVISION AREDS 2+MULTI VIT) CAPS Take 1 capsule by mouth 2 (two) times daily.   Yes [provider]  pantoprazole (PROTONIX) 40 MG tablet Take 40 mg by mouth daily before lunch.  02/12/18  Yes [provider]  potassium chloride (K-DUR,KLOR-CON) 10 MEQ tablet Take 10 mEq by mouth 2 (two) times daily.   Yes [provider]  timolol (TIMOPTIC) 0.5 % ophthalmic solution Place 1 drop into both eyes 2 (two) times daily. 03/07/18  Yes [provider]    Medications:  I have reviewed the patient's current medications. Scheduled: . carvedilol  3.125 mg Oral BID WC  . chlorhexidine  15 mL Mouth Rinse BID  . feeding supplement (PRO-STAT SUGAR FREE 64)  30 mL Oral BID  . furosemide  60 mg Intravenous  Q8H  . insulin aspart  0-15 Units Subcutaneous TID WC  . ipratropium-albuterol  3 mL Nebulization BID  . mouth rinse  15 mL Mouth Rinse q12n4p  . oxymetazoline  1 spray Each Nare BID  . pantoprazole  40 mg Oral Daily   Continuous: . doxycycline (VIBRAMYCIN) IV 100 mg (04/02/18 1151)    Results for orders placed or performed during the hospital encounter of 04/17/2018 (from the past 48 hour(s))  Basic  metabolic panel     Status: Abnormal   Collection Time: 04/01/18  2:37 AM  Result Value Ref Range   Sodium 135 135 - 145 mmol/L   Potassium 4.2 3.5 - 5.1 mmol/L   Chloride 92 (L) 98 - 111 mmol/L   CO2 33 (H) 22 - 32 mmol/L   Glucose, Bld 144 (H) 70 - 99 mg/dL   BUN 14 8 - 23 mg/dL   Creatinine, Ser 0.92 0.61 - 1.24 mg/dL   Calcium 8.6 (L) 8.9 - 10.3 mg/dL   GFR calc non Af Amer >60 >60 mL/min   GFR calc Af Amer >60 >60 mL/min    Comment: (NOTE) The eGFR has been calculated using the CKD EPI equation. This calculation has not been validated in all clinical situations. eGFR's persistently <60 mL/min signify possible Chronic Kidney Disease.    Anion gap 10 5 - 15    Comment: Performed at [facility] [address], [place], [place] [address]  Glucose, capillary     Status: Abnormal   Collection Time: 04/01/18  7:57 AM  Result Value Ref Range   Glucose-Capillary 141 (H) 70 - 99 mg/dL  Blood gas, arterial     Status: Abnormal   Collection Time: 04/01/18  9:26 AM  Result Value Ref Range   O2 Content 3.0 L/min   Delivery systems NASAL CANNULA    pH, Arterial 7.277 (L) 7.350 - 7.450   pCO2 arterial 85.6 (HH) 32.0 - 48.0 mmHg    Comment: CRITICAL RESULT CALLED TO, READ BACK BY AND VERIFIED WITH: HELLA LAYTON, RN AT 6045 BY JEFF SCHENEUG, RRT RCP ON 04/01/2018    pO2, Arterial 131 (H) 83.0 - 108.0 mmHg   Bicarbonate 38.6 (H) 20.0 - 28.0 mmol/L   Acid-Base Excess 11.7 (H) 0.0 - 2.0 mmol/L   O2 Saturation 98.7 %   Patient temperature 98.6    Collection site RIGHT RADIAL    Drawn by [id number]    Sample type ARTERIAL DRAW    Allens test (pass/fail) PASS PASS  Glucose, capillary     Status: Abnormal   Collection Time: 04/01/18 11:54 AM  Result Value Ref Range   Glucose-Capillary 117 (H) 70 - 99 mg/dL  Blood gas, arterial     Status: Abnormal   Collection Time: 04/01/18 12:42 PM  Result Value Ref Range   FIO2 40.00    Delivery systems BILEVEL POSITIVE AIRWAY PRESSURE    LHR  10.0 resp/min   Inspiratory PAP 16.0    Expiratory PAP 8.0    pH, Arterial 7.367 7.350 - 7.450   pCO2 arterial 69.1 (HH) 32.0 - 48.0 mmHg    Comment: CRITICAL RESULT CALLED TO, READ BACK BY AND VERIFIED WITH:  ERIN MCAULEY RN @ 1250 BY ACOBB RRT, RCP ON 04/01/2018    pO2, Arterial 110 (H) 83.0 - 108.0 mmHg   Bicarbonate 38.7 (H) 20.0 - 28.0 mmol/L   Acid-Base Excess 12.9 (H) 0.0 - 2.0 mmol/L   O2 Saturation 98.5 %   Patient temperature 98.6  Collection site RIGHT RADIAL    Drawn by [phone]    Sample type ARTERIAL DRAW    Allens test (pass/fail) PASS PASS  Glucose, capillary     Status: Abnormal   Collection Time: 04/01/18  5:06 PM  Result Value Ref Range   Glucose-Capillary 110 (H) 70 - 99 mg/dL  Glucose, capillary     Status: Abnormal   Collection Time: 04/01/18 10:05 PM  Result Value Ref Range   Glucose-Capillary 139 (H) 70 - 99 mg/dL  Basic metabolic panel     Status: Abnormal   Collection Time: 04/02/18  5:20 AM  Result Value Ref Range   Sodium 137 135 - 145 mmol/L   Potassium 3.7 3.5 - 5.1 mmol/L   Chloride 93 (L) 98 - 111 mmol/L   CO2 35 (H) 22 - 32 mmol/L   Glucose, Bld 118 (H) 70 - 99 mg/dL   BUN 18 8 - 23 mg/dL   Creatinine, Ser 0.96 0.61 - 1.24 mg/dL   Calcium 8.4 (L) 8.9 - 10.3 mg/dL   GFR calc non Af Amer >60 >60 mL/min   GFR calc Af Amer >60 >60 mL/min    Comment: (NOTE) The eGFR has been calculated using the CKD EPI equation. This calculation has not been validated in all clinical situations. eGFR's persistently <60 mL/min signify possible Chronic Kidney Disease.    Anion gap 9 5 - 15    Comment: Performed at [facility] [address], [place], [place] [address]  CBC     Status: Abnormal   Collection Time: 04/02/18  5:20 AM  Result Value Ref Range   WBC 6.9 4.0 - 10.5 K/uL   RBC 3.01 (L) 4.22 - 5.81 MIL/uL   Hemoglobin 8.5 (L) 13.0 - 17.0 g/dL   HCT 28.1 (L) 39.0 - 52.0 %   MCV 93.4 80.0 - 100.0 fL   MCH 28.2 26.0 - 34.0 pg   MCHC 30.2  30.0 - 36.0 g/dL   RDW 14.6 11.5 - 15.5 %   Platelets 163 150 - 400 K/uL   nRBC 0.0 0.0 - 0.2 %    Comment: Performed at [facility], [place] [address], [place], [place] [address]  Glucose, capillary     Status: Abnormal   Collection Time: 04/02/18  7:57 AM  Result Value Ref Range   Glucose-Capillary 101 (H) 70 - 99 mg/dL  Glucose, capillary     Status: Abnormal   Collection Time: 04/02/18 12:28 PM  Result Value Ref Range   Glucose-Capillary 177 (H) 70 - 99 mg/dL  Glucose, capillary     Status: Abnormal   Collection Time: 04/02/18  5:09 PM  Result Value Ref Range   Glucose-Capillary 176 (H) 70 - 99 mg/dL    Dg Chest Port 1 View  Result Date: 04/01/2018 CLINICAL DATA:  Dyspnea EXAM: PORTABLE CHEST 1 VIEW COMPARISON:  03/30/2018 FINDINGS: Cardiac shadow remains enlarged. Postsurgical changes are again seen. Bilateral pleural effusions are noted increasing on the right when compared with the prior exam. Persistent perihilar opacities are noted. No new focal abnormality is seen. IMPRESSION: Increasing right-sided pleural effusion. The remainder of the exam is stable from the prior study. Electronically Signed   By: Inez Catalina M.D.   On: 04/01/2018 09:53   Review of Systems  All other systems reviewed and are negative.  Blood pressure 128/77, pulse 86, temperature 97.7 F (36.5 C), resp. rate 20, height '5\' 9"'$  (1.753 m), weight 112.3 kg, SpO2 99 %.  General appearance :Awake, alert, not in any distress.  Eyes: His pupils are equal, round, reactive to light. Extraocular motion is intact.  Ears: Examination of the ears shows normal auricles and external auditory canals bilaterally.  Nose: Nasal examination shows active bleeding from the right nasal cavity. No bleeding on the left. Face: Facial examination shows no asymmetry. Palpation of the face elicit no significant tenderness.  Mouth: Oral cavity examination shows a large amount of blood clots. Neck:  Palpation of the neck reveals  no lymphadenopathy or mass. The trachea is midline. The thyroid is not significantly enlarged.  Resp:Good air entry bilaterally, bibasilar rales neurology: Generalized weakness but nonfocal. Skin:No Rash, warm and dry.  Procedure: Anterior/Posterior nasal packing for control of right epistaxis. Anesthesia: Topical xylocaine and Afrin Description: The patient is placed upright in her hospital bed.  Blood clot is suctions from the right nasal cavity. Bleeding is noted from anterior and posterior right nasal cavity.Topical xylocaine and Afrin are applied. An 8 cm Merocel packing is placed in the right nasal cavity with good hemostasis.  The patient tolerated the procedure well.  Assessment/Plan: Severe right anterior and posterior epistaxis. Bleeding controlled with Merocel packing. - Will leave packing in place for approximately 5 days. - Will need Gram + antibiotic coverage while packing is in place (e.g. Ancef, clindamycin) - Hold Eliquis.  Bennette Hasty W Cniyah Sproull 04/02/2018, 9:17 PM

## 2018-04-02 NOTE — Care Management Note (Signed)
Case Management Note  Patient Details  Name: GRIFFEY NICASIO MRN: [id number] Date of Birth: 1932/01/16  Subjective/Objective:  Cardiac arrested                Russel Morain (Spouse)     [phone]      PCP: Leanora Ivanoff  Action/Plan: Plan to d/c to home with home health services to follow when medically ready. Referral made with [facility] / [place] for home bipap/ trilogy. NCM continuing to follow for   [facility] needs   Expected Discharge Date:                  Expected Discharge Plan:  [place]  In-House Referral:  NA  Discharge planning Services  CM Consult  Post Acute Care Choice:  Home Health Choice offered to:  Spouse  DME Arranged:  N/A DME Agency:  NA  HH Arranged:  PT,OT,RN [place] Agency:  [facility]  Status of Service:  In process, will continue to follow  If discussed at Long Length of Stay Meetings, dates discussed:    Additional Comments:  Sharin Mons, RN 04/02/2018, 10:49 AM

## 2018-04-02 NOTE — Progress Notes (Signed)
  Speech Language Pathology Treatment: Dysphagia  Patient Details Name: PIETER FOOKS MRN: [id number] DOB: [birth year] Today's Date: 04/02/2018 Time: 7026-3785 SLP Time Calculation (min) (ACUTE ONLY): 35 min  Assessment / Plan / Recommendation Clinical Impression  Note events of pt respiratory acidosis and requiring Bipap as well at increased right pleural effusion.  Pt reports to this SLP h/o coughing/choking with food more than drink for "years" on "occasion".  He denies difficulty with refluxing and wife reports he has been on pantoprazole since 2006.    Pt did NOT pass 3 ounce water test as he required rest break after approximately 1.5 ounce.  Skilled observation of intake including graham cracker, applesauce, juice and water completed.  Pt presents with occasional wet vocal quality, throat clearing and belching with cough post-swallow with POOR awareness causing SLP to suspect chronic difficulty.   Suspect multifactorial dysphagia present given cerebellar chronic infarcts and h/o reflux and Hiatal hernia repair in approximately 1973.  Will proceed with MBS due to pt's subtle indication of possible pharyngeal and cervical esophageal dysphagia.  Wife inquired plan if difficulty noted.  Advised wife to testing for therapeutic reasoning also - not just diagnosis.  Also advised SLP role may be to mitigate aspiration not prevent it.  All parties agreeable to proceed with MBS that is scheduled for today at approximately1330.  Thanks for allowing me to help care for this pt.      HPI HPI:  84 year old with pmh of aortic aneurysm, chronic atrial fibrillation on Eliquis, CAD s/p CABG/AVR 2006, HTN, type 2 DM, HLD, prostate cancer with radioactive seed implant, colon cancer s/p resection and colostomy 02/07/2017 with post-op CVA, COPD presenting from home after witnessed unresponsiveness. 5 mins later on EMS arrival found pulseless and ROSC obtained after 5 mins without defib or meds.  Required intubation  for airway protection with some purposeful movement in ER.        SLP Plan  MBS(today)       Recommendations  Diet recommendations: Dysphagia 3 (mechanical soft);Thin liquid Liquids provided via: Cup;Straw Medication Administration: Whole meds with puree Supervision: Patient able to self feed Compensations: Minimize environmental distractions;Slow rate;Small sips/bites Postural Changes and/or Swallow Maneuvers: Seated upright 90 degrees;Upright 30-60 min after meal                Oral Care Recommendations: Oral care BID Follow up Recommendations: Other (comment)(TBD) SLP Visit Diagnosis: Dysphagia, unspecified (R13.10) Plan: MBS(today)       GO                Macario Golds 04/02/2018, 12:01 PM   Luanna Salk, [facility] SLP [place] Pager [phone] Office [phone]

## 2018-04-02 NOTE — Progress Notes (Signed)
SLP Cancellation Note  Patient Details Name: Terry Wu MRN: [id number] DOB: Apr 13, 1932   Cancelled treatment:       Reason Eval/Treat Not Completed: Medical issues which prohibited therapy. Pt not able to come to radiology today due to bleeding issues per discussion with RN. She says that family is aware that testing will not be completed today. Will f/u for MBS as able.   Germain Osgood 04/02/2018, 2:01 PM   Germain Osgood, M.A. [place] Acute Environmental education officer [phone] Office [phone]

## 2018-04-02 NOTE — Progress Notes (Signed)
Physical Therapy Treatment Patient Details Name: Terry Wu MRN: [id number] DOB: 10-Oct-1931 Today's Date: 04/02/2018    History of Present Illness 84 yo presenting as cardiac arrest at home s/p CPR and intubation 10/2-10/4. PMHx: Aortic aneurysm, chronic Afib, CAD s/p CABG/AVR 2006, HTN, type 2 DM, HLD, prostate cancer with radioactive seed implant, colon cancer s/p resection and colostomy 02/07/2017 with post-op CVA, COPD    PT Comments    Pt off BIPAP and able to participate in session this am.  Pt eager to mobilize.  He continues to fatigue quickly.  Plan next session for continued functional mobility to return home safely.  Pt continues to require HHPT and assistance for mobility at this time.     Follow Up Recommendations  Home health PT;Supervision for mobility/OOB     Equipment Recommendations  3in1 (PT)    Recommendations for Other Services OT consult     Precautions / Restrictions Precautions Precautions: Fall Precaution Comments: watch HR Restrictions Weight Bearing Restrictions: No    Mobility  Bed Mobility Overal bed mobility: Needs Assistance Bed Mobility: Supine to Sit     Supine to sit: Mod assist;HOB elevated     General bed mobility comments: Pt able to initiate movement of Legs to edge of bed.  required assistance for trunk elevation and scooting hips forward to edge of bed.    Transfers Overall transfer level: Needs assistance Equipment used: Rolling walker (2 wheeled) Transfers: Sit to/from Stand Sit to Stand: Min assist;+2 safety/equipment         General transfer comment: cues for hand placement, min assist to rise and steady.  Pt with poor hand placement attempting to pull on RW into standing.    Ambulation/Gait Ambulation/Gait assistance: Min assist;+2 safety/equipment Gait Distance (Feet): 12 Feet Assistive device: Rolling walker (2 wheeled) Gait Pattern/deviations: Step-through pattern;Decreased stride length;Trunk flexed;Wide base  of support;Shuffle Gait velocity: decreased    General Gait Details: VSS, cues for encouragement to advance gait distance.  Decreased stride in a shuffling pattern noted.  Cues for increased stride length.     Stairs             Wheelchair Mobility    Modified Rankin (Stroke Patients Only)       Balance Overall balance assessment: Needs assistance Sitting-balance support: Bilateral upper extremity supported Sitting balance-Leahy Scale: Fair       Standing balance-Leahy Scale: Poor Standing balance comment: requires B UE support of walker                            Cognition Arousal/Alertness: Awake/alert Behavior During Therapy: Flat affect Overall Cognitive Status: (not formally assessed.  )                                        Exercises      General Comments        Pertinent Vitals/Pain Pain Assessment: No/denies pain Faces Pain Scale: No hurt    Home Living                      Prior Function            PT Goals (current goals can now be found in the care plan section) Acute Rehab PT Goals Patient Stated Goal: to be able to go home. Potential to Achieve Goals: Good Progress  towards PT goals: Progressing toward goals    Frequency    Min 3X/week      PT Plan Current plan remains appropriate    Co-evaluation   Reason for Co-Treatment: For patient/therapist safety   OT goals addressed during session: ADL's and self-care      AM-PAC PT "6 Clicks" Daily Activity  Outcome Measure  Difficulty turning over in bed (including adjusting bedclothes, sheets and blankets)?: Unable Difficulty moving from lying on back to sitting on the side of the bed? : Unable Difficulty sitting down on and standing up from a chair with arms (e.g., wheelchair, bedside commode, etc,.)?: Unable Help needed moving to and from a bed to chair (including a wheelchair)?: A Little Help needed walking in hospital room?: A  Little Help needed climbing 3-5 steps with a railing? : A Lot 6 Click Score: 11    End of Session Equipment Utilized During Treatment: Gait belt;Oxygen Activity Tolerance: Patient tolerated treatment well Patient left: in chair;with call bell/phone within reach;with chair alarm set;with family/visitor present Nurse Communication: Mobility status PT Visit Diagnosis: Unsteadiness on feet (R26.81);Other abnormalities of gait and mobility (R26.89);History of falling (Z91.81);Muscle weakness (generalized) (M62.81)     Time: 3524-8185 PT Time Calculation (min) (ACUTE ONLY): 23 min  Charges:  $Gait Training: 8-22 mins                     Governor Rooks, PTA Acute Rehabilitation Services Pager [phone] Office [phone]     Ilia Dimaano Eli Hose 04/02/2018, 1:20 PM

## 2018-04-02 NOTE — Progress Notes (Addendum)
PROGRESS NOTE        PATIENT DETAILS Name: Terry Wu Age: 84 y.o. Sex: male Date of Birth: April 11, 1932 Admit Date: 04/10/2018 Admitting Physician Kandice Hams, MD [id number], Percell Miller, MD  Brief Narrative: Patient is a 84 y.o. male past medical history of CAD status post CABG/aortic valve replacement in 2006, history of colon cancer status post resection/colostomy in 4128 complicated by postoperative stroke, A. fib on Eliquis-admitted to the hospital on 10/2 following a cardiac arrest with return of spontaneous circulation in approximately 5 minutes.  No shocks were administered.  Patient was intubated in the emergency room for airway protection, subsequently managed in the intensive care unit stabilized and transferred to the hospitalist service.  Subsequent hospital course has been complicated by hypercarbic respiratory failure, decompensated diastolic heart failure, and now likely epistaxis and hematuria.  See below for further details  Subjective: Multiple episodes of epistaxis this morning-appears to have hematuria, with some blood leaking around the Foley catheter tip.  Bleeding is overall better-he is awake and alert.  Assessment/Plan: Cardiac arrest: Reviewed H&P/ED note-appears to have a cardiac arrest-no shocks were administered-no EKG/telemetry strips to review-but apparently no shocks were administered, patient had return of spontaneous circulation within 5 minutes.  Suspect this could have been a respiratory event.  Echocardiogram showed preserved EF, telemetry this morning showed sinus rhythm.  Continue telemetry monitoring-he is now a partial code-okay for noninvasive ventilation.  Acute on chronic diastolic heart failure: Remains volume overloaded-continue IV Lasix-follow electrolytes, weights, intake output and volume status.  Acute hypercarbic respiratory failure: Suspect multifactorial-related to underlying COPD, CHF-possibly has OSA.  He  was encephalopathic on 10/8-this morning he is completely awake and alert.  Suspect needs scheduled BiPAP nightly and PRN sleep.  May need outpatient sleep study at some point.  Will ask case management to see if he can qualify for trilogy BiPAP on discharge.  Addendum: Patient continues to exhibit signs of hypercapnia associated with chronic respiratory failure secondary to severe COPD.  Patient requires the use of an IV both nightly and daytime to help with exacerbation periods.  The use of the NIV will treat patient's high PCO2 and can reduce the risk of exacerbations and future hospitalizations when he was at night and during the day.  Patient will need these advance settings in conjunction with the current medication regimen, BiPAP is not an option due to its functional limitations and the severity of the patient's condition.  Failure to have NIV available for use over 24-hour period could lead to death.  MSSA pneumonia: Sputum cultures positive for MSSA-remains on doxycycline.  Right-sided pleural effusion: I suspect this is secondary to heart failure-continue IV Lasix-if he worsens or become hypoxic we can consider thoracocentesis.  Epistaxis: Occurred on 10/9-continues to occur intermittently throughout the day-in spite of mechanical compression, Afrin nasal spray-have consulted ENT-Dr Benjamine Mola will evaluate later and hold Eliquis for now.  Hematuria: Occurred on 10/9-highly suspicious for Foley trauma-holding Eliquis-flush periodically-hopefully it would improve if not-we will consult urology.  PAF: Sinus rhythm on telemetry-continue Coreg-due to epistaxis and hematuria that occurred on 10/9-we will go to hold Eliquis.  Spouse aware of the difficult situation-and the potential risk of CVA.  Hyponatremia: Secondary to hypervolemia-resolved with diuretics  Hypokalemia: Resolved with repletion-follow periodically.  Normocytic anemia: Probably secondary to acute illness-no indication for  transfusion as of now-follow.  DM-2:  CBG stable-continue SSI  Hypertension: Controlled continue Coreg and Lasix.  COPD: Lungs without rhonchi-has some bibasilar rales-continue bronchodilators-no role for steroids at this time.  GERD: Continue PPI  Dysphagia: Remains on a dysphagia 3 diet-speech therapy planning a modified barium swallow at some point.  DVT Prophylaxis: Eliquis held on 10/9  Code Status: Partial code-okay for noninvasive ventilation.  DNI/no defibrillation  Family Communication: Spouse at bedside  Disposition Plan: Remain inpatient-requires several more days of hospital stay before discharge.  Antimicrobial agents: Anti-infectives (From admission, onward)   Start     Dose/Rate Route Frequency Ordered Stop   04/01/18 1200  doxycycline (VIBRAMYCIN) 100 mg in sodium chloride 0.9 % 250 mL IVPB     100 mg 125 mL/hr over 120 Minutes Intravenous Every 12 hours 04/01/18 1122     03/30/18 1030  doxycycline (VIBRA-TABS) tablet 100 mg  Status:  Discontinued     100 mg Oral Every 12 hours 03/30/18 1014 04/01/18 1122   03/29/18 1400  vancomycin (VANCOCIN) 1,250 mg in sodium chloride 0.9 % 250 mL IVPB  Status:  Discontinued     1,250 mg 166.7 mL/hr over 90 Minutes Intravenous Every 24 hours 03/28/18 1131 03/30/18 1014   03/28/18 1230  vancomycin (VANCOCIN) 2,000 mg in sodium chloride 0.9 % 500 mL IVPB     2,000 mg 250 mL/hr over 120 Minutes Intravenous  Once 03/28/18 1131 03/28/18 1557      Procedures: 10/2>> ETT  CONSULTS: PCCM ENT  Time spent: 35  minutes-Greater than 50% of this time was spent in counseling, explanation of diagnosis, planning of further management, and coordination of care.  MEDICATIONS: Scheduled Meds: . carvedilol  3.125 mg Oral BID WC  . chlorhexidine  15 mL Mouth Rinse BID  . feeding supplement (PRO-STAT SUGAR FREE 64)  30 mL Oral BID  . furosemide  60 mg Intravenous Q8H  . insulin aspart  0-15 Units Subcutaneous TID WC  .  ipratropium-albuterol  3 mL Nebulization BID  . mouth rinse  15 mL Mouth Rinse q12n4p  . oxymetazoline  1 spray Each Nare BID  . pantoprazole  40 mg Oral Daily   Continuous Infusions: . doxycycline (VIBRAMYCIN) IV 100 mg (04/02/18 1151)   PRN Meds:.bisacodyl, docusate, ipratropium-albuterol   PHYSICAL EXAM: Vital signs: Vitals:   04/01/18 2346 04/02/18 0800 04/02/18 0826 04/02/18 1355  BP:  125/64    Pulse: 89 74    Resp: (!) 22 (!) 30    Temp:  98.9 F (37.2 C)    TempSrc:  Oral    SpO2: 99% 98% 100% 98%  Weight:      Height:       Filed Weights   03/29/18 0546 03/30/18 0600 03/31/18 0500  Weight: 111.1 kg 110.7 kg 112.3 kg   Body mass index is 36.56 kg/m.   General appearance :Awake, alert, not in any distress.  Chronically sick appearing Eyes:.Pink conjunctiva HEENT: Atraumatic and Normocephalic Neck: supple Resp:Good air entry bilaterally, bibasilar rales CVS: S1 S2 regular, no murmurs.  GI: Bowel sounds present, Non tender and not distended with no gaurding, rigidity or rebound.No organomegaly Extremities: B/L Lower Ext shows 2+ edema  neurology: Generalized weakness but nonfocal. Musculoskeletal:No digital cyanosis Skin:No Rash, warm and dry Wounds:N/A  I have personally reviewed following labs and imaging studies  LABORATORY DATA: CBC: Recent Labs  Lab 04/04/2018 1610  03/28/18 0352 03/29/18 0240 03/30/18 0230 03/31/18 0420 04/02/18 0520  WBC 11.2*   < > 12.4* 12.3* 11.5* 9.9 6.9  NEUTROABS 7.6  --  10.1*  --   --   --   --   HGB 10.2*   < > 9.4* 9.3* 9.2* 9.3* 8.5*  HCT 32.4*   < > 28.9* 29.9* 29.9* 29.7* 28.1*  MCV 93.9   < > 90.3 92.9 93.4 94.0 93.4  PLT 214   < > 189 166 174 168 163   < > = values in this interval not displayed.    Basic Metabolic Panel: Recent Labs  Lab 04/17/2018 2221 03/27/18 0410 03/28/18 0352 03/29/18 0240 03/30/18 0230 03/31/18 0420 04/01/18 0237 04/02/18 0520  NA 130* 129* 131* 132* 134* 134* 135 137  K 4.2  4.1 3.4* 3.4* 4.0 4.2 4.2 3.7  CL 89* 90* 92* 94* 95* 95* 92* 93*  CO2 31 28 28 30 31 31  33* 35*  GLUCOSE 134* 90 95 152* 127* 152* 144* 118*  BUN 16 16 13 11 11 11 14 18   CREATININE 0.92 0.91 0.96 0.86 0.81 0.85 0.92 0.96  CALCIUM 9.0 8.5* 8.1* 7.8* 8.2* 8.2* 8.6* 8.4*  MG 2.0 1.9 1.8 2.1  --   --   --   --   PHOS 3.5 3.3 3.1 3.5  --   --   --   --     GFR: Estimated Creatinine Clearance: 70.7 mL/min (by C-G formula based on SCr of 0.96 mg/dL).  Liver Function Tests: Recent Labs  Lab 04/04/2018 1610 04/17/2018 2221 03/27/18 0410 03/29/18 0240  AST 58*  --   --  22  ALT 50*  --   --  33  ALKPHOS 37*  --   --  32*  BILITOT 0.9  --   --  0.9  PROT 6.6  --   --  5.8*  ALBUMIN 2.7* 2.8* 2.7* 2.4*   No results for input(s): LIPASE, AMYLASE in the last 168 hours. Recent Labs  Lab 03/27/18 0617  AMMONIA 35    Coagulation Profile: Recent Labs  Lab 03/27/2018 1610  INR 2.01    Cardiac Enzymes: Recent Labs  Lab 04/18/2018 1610 04/15/2018 2221 03/27/18 0410 03/27/18 1028  TROPONINI 0.03* 0.10* 0.09* 0.06*    BNP (last 3 results) No results for input(s): PROBNP in the last 8760 hours.  HbA1C: No results for input(s): HGBA1C in the last 72 hours.  CBG: Recent Labs  Lab 04/01/18 1154 04/01/18 1706 04/01/18 2205 04/02/18 0757 04/02/18 1228  GLUCAP 117* 110* 139* 101* 177*    Lipid Profile: No results for input(s): CHOL, HDL, LDLCALC, TRIG, CHOLHDL, LDLDIRECT in the last 72 hours.  Thyroid Function Tests: No results for input(s): TSH, T4TOTAL, FREET4, T3FREE, THYROIDAB in the last 72 hours.  Anemia Panel: No results for input(s): VITAMINB12, FOLATE, FERRITIN, TIBC, IRON, RETICCTPCT in the last 72 hours.  Urine analysis:    Component Value Date/Time   COLORURINE YELLOW 04/15/2018 1710   APPEARANCEUR HAZY (A) 04/09/2018 1710   LABSPEC 1.013 04/03/2018 1710   PHURINE 5.0 04/03/2018 1710   GLUCOSEU NEGATIVE 04/14/2018 1710   HGBUR LARGE (A) 03/29/2018 1710    BILIRUBINUR NEGATIVE 04/15/2018 1710   KETONESUR NEGATIVE 04/22/2018 1710   PROTEINUR 100 (A) 04/22/2018 1710   NITRITE NEGATIVE 04/07/2018 1710   LEUKOCYTESUR NEGATIVE 04/12/2018 1710    Sepsis Labs: Lactic Acid, Venous    Component Value Date/Time   LATICACIDVEN 1.81 03/28/2018 1809    MICROBIOLOGY: Recent Results (from the past 240 hour(s))  Culture, blood (routine x 2)     Status: None  Collection Time: 03/31/2018  6:16 PM  Result Value Ref Range Status   Specimen Description BLOOD RIGHT ARM  Final   Special Requests   Final    BOTTLES DRAWN AEROBIC AND ANAEROBIC Blood Culture adequate volume   Culture   Final    NO GROWTH 5 DAYS Performed at [facility], [address], [place], [place] [address]    Report Status 03/31/2018 FINAL  Final  MRSA PCR Screening     Status: None   Collection Time: 04/19/2018  9:50 PM  Result Value Ref Range Status   MRSA by PCR NEGATIVE NEGATIVE Final    Comment:        The GeneXpert MRSA Assay (FDA approved for NASAL specimens only), is one component of a comprehensive MRSA colonization surveillance program. It is not intended to diagnose MRSA infection nor to guide or monitor treatment for MRSA infections. Performed at [facility], [place] [address] [place], [place] [address]   Culture, blood (routine x 2)     Status: None   Collection Time: 04/12/2018 10:15 PM  Result Value Ref Range Status   Specimen Description BLOOD RIGHT HAND  Final   Special Requests   Final    BOTTLES DRAWN AEROBIC ONLY Blood Culture adequate volume   Culture   Final    NO GROWTH 5 DAYS Performed at [facility], [place] [address] [place], [place] [address]    Report Status 03/31/2018 FINAL  Final  Culture, respiratory (non-expectorated)     Status: None   Collection Time: 03/27/18 11:29 AM  Result Value Ref Range Status   Specimen Description TRACHEAL ASPIRATE  Final   Special Requests NONE  Final   Gram Stain   Final    NO SQUAMOUS  EPITHELIAL CELLS PRESENT ABUNDANT WBC PRESENT, PREDOMINANTLY PMN RARE GRAM POSITIVE COCCI Performed at [facility], [place] [address], [place],  [address]    Culture FEW STAPHYLOCOCCUS AUREUS  Final   Report Status 03/29/2018 FINAL  Final   Organism ID, Bacteria STAPHYLOCOCCUS AUREUS  Final      Susceptibility   Staphylococcus aureus - MIC*    CIPROFLOXACIN <=0.5 SENSITIVE Sensitive     ERYTHROMYCIN >=8 RESISTANT Resistant     GENTAMICIN <=0.5 SENSITIVE Sensitive     OXACILLIN 0.5 SENSITIVE Sensitive     TETRACYCLINE <=1 SENSITIVE Sensitive     VANCOMYCIN 1 SENSITIVE Sensitive     TRIMETH/SULFA <=10 SENSITIVE Sensitive     CLINDAMYCIN <=0.25 SENSITIVE Sensitive     RIFAMPIN <=0.5 SENSITIVE Sensitive     Inducible Clindamycin NEGATIVE Sensitive     * FEW STAPHYLOCOCCUS AUREUS    RADIOLOGY STUDIES/RESULTS: Ct Head Wo Contrast  Result Date: 04/18/2018 CLINICAL DATA:  C-spine trauma.  Cardiac arrest.  CPR for 5 minutes. EXAM: CT HEAD WITHOUT CONTRAST CT CERVICAL SPINE WITHOUT CONTRAST TECHNIQUE: Multidetector CT imaging of the head and cervical spine was performed following the standard protocol without intravenous contrast. Multiplanar CT image reconstructions of the cervical spine were also generated. COMPARISON:  CT scan February 21, 2017 FINDINGS: CT HEAD FINDINGS Brain: No subdural, epidural, or subarachnoid hemorrhage. Cerebellum demonstrates no acute abnormalities. A tiny lacunar infarct is seen in the right cerebellar hemisphere on axial image 8. Basal cisterns are unchanged unremarkable. Ventricles and sulci are stable. No acute cortical ischemia or infarct. No mass effect or midline shift. Vascular: Calcified atherosclerosis in the intracranial carotids. Skull: Normal. Negative for fracture or focal lesion. Sinuses/Orbits: There  is opacification of scattered left mastoid air cells, improved since February 21, 2017. The right mastoid air cells, middle ears are normal. Scattered  opacification of ethmoid air cells with fluid in the sphenoid sinuses and a mucous retention cyst in the left maxillary sinus. Other: None. CT CERVICAL SPINE FINDINGS Alignment: Normal. Skull base and vertebrae: No acute fracture. No primary bone lesion or focal pathologic process. Soft tissues and spinal canal: No prevertebral fluid or swelling. No visible canal hematoma. Disc levels:  Multilevel degenerative changes. Upper chest: Mild opacity in the posterior aspect of the right upper lobe. Other: No other abnormalities. IMPRESSION: 1. No acute intracranial abnormalities. 2. No fracture or traumatic malalignment cervical spine. Multilevel degenerative changes. 3. Sinus disease as above. Electronically Signed   By: Dorise Bullion III M.D   On: 04/15/2018 19:02   Ct Cervical Spine Wo Contrast  Result Date: 04/18/2018 CLINICAL DATA:  C-spine trauma.  Cardiac arrest.  CPR for 5 minutes. EXAM: CT HEAD WITHOUT CONTRAST CT CERVICAL SPINE WITHOUT CONTRAST TECHNIQUE: Multidetector CT imaging of the head and cervical spine was performed following the standard protocol without intravenous contrast. Multiplanar CT image reconstructions of the cervical spine were also generated. COMPARISON:  CT scan February 21, 2017 FINDINGS: CT HEAD FINDINGS Brain: No subdural, epidural, or subarachnoid hemorrhage. Cerebellum demonstrates no acute abnormalities. A tiny lacunar infarct is seen in the right cerebellar hemisphere on axial image 8. Basal cisterns are unchanged unremarkable. Ventricles and sulci are stable. No acute cortical ischemia or infarct. No mass effect or midline shift. Vascular: Calcified atherosclerosis in the intracranial carotids. Skull: Normal. Negative for fracture or focal lesion. Sinuses/Orbits: There is opacification of scattered left mastoid air cells, improved since February 21, 2017. The right mastoid air cells, middle ears are normal. Scattered opacification of ethmoid air cells with fluid in the sphenoid  sinuses and a mucous retention cyst in the left maxillary sinus. Other: None. CT CERVICAL SPINE FINDINGS Alignment: Normal. Skull base and vertebrae: No acute fracture. No primary bone lesion or focal pathologic process. Soft tissues and spinal canal: No prevertebral fluid or swelling. No visible canal hematoma. Disc levels:  Multilevel degenerative changes. Upper chest: Mild opacity in the posterior aspect of the right upper lobe. Other: No other abnormalities. IMPRESSION: 1. No acute intracranial abnormalities. 2. No fracture or traumatic malalignment cervical spine. Multilevel degenerative changes. 3. Sinus disease as above. Electronically Signed   By: Dorise Bullion III M.D   On: 04/21/2018 19:02   [name] Head Wo Contrast  Result Date: 03/27/2018 CLINICAL DATA:  84 y/o M; cardiac arrest, CPR, ROSC in 5 minutes, total down time 10 minutes. EXAM: MRI HEAD WITHOUT CONTRAST MRA HEAD WITHOUT CONTRAST MRA NECK WITHOUT CONTRAST TECHNIQUE: Multiplanar, multiecho pulse sequences of the brain and surrounding structures were obtained without intravenous contrast. Angiographic images of the Circle of Willis were obtained using MRA technique without intravenous contrast. Angiographic images of the neck were obtained using MRA technique without intravenous contrast. Carotid stenosis measurements (when applicable) are obtained utilizing NASCET criteria, using the distal internal carotid diameter as the denominator. COMPARISON:  02/10/2017 MRA head and neck. 02/09/2017 MRI head. 04/19/2018 CT head and cervical spine. FINDINGS: MRI HEAD FINDINGS Brain: No acute infarction, hemorrhage, hydrocephalus, extra-axial collection or mass lesion. 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 Small chronic infarcts in the right cerebellar hemisphere. There are several foci of susceptibility hypointensity within the bilateral cerebral hemispheres and the cerebellum compatible with hemosiderin  deposition  from prior microhemorrhage, grossly stable from prior MRI given differences in technique and motion artifact. There is hemosiderin staining of the right superior cerebellar chronic infarction. Scattered background of nonspecific T2 FLAIR hyperintensities in white matter compatible with mild chronic microvascular ischemic changes and there is mild volume loss of the brain. Vascular: As below. Skull and upper cervical spine: Normal marrow signal. Sinuses/Orbits: Diffuse paranasal sinus mucosal thickening and fluid levels as well as left mastoid opacification, probably due to intubation. No abnormal signal of the right mastoid air cells. Orbits are unremarkable. Other: None. MRA HEAD FINDINGS Internal carotid arteries: Patent. Carotid siphon irregularity compatible with atherosclerosis. No significant stenosis. Anterior cerebral arteries:  Patent. Middle cerebral arteries: Patent. Anterior communicating artery: Patent. Posterior communicating arteries: Fetal left PCA. No right posterior communicating artery identified, likely hypoplastic or absent. Posterior cerebral arteries: Patent. Small caliber right vertebral artery largely terminates in the right PICA with minimal contribution to the basilar. Basilar artery:  Patent. Vertebral arteries:  Patent. No evidence of high-grade stenosis, large vessel occlusion, or aneurysm unless noted above. MRA NECK FINDINGS Aortic arch: Not included within the field of view. Right common carotid artery: Patent. Right internal carotid artery: Patent. Suspected severe proximal right ICA stenosis. Right vertebral artery: Patent. Left common carotid artery: Patent. Left Internal carotid artery: Patent.Mild irregularity of left proximal ICA compatible with atherosclerosis without significant stenosis. Hairpin turn of mid left cervical ICA. Left Vertebral artery: Patent.  Left dominant. Motion degraded study. IMPRESSION: MRI head: 1. No acute intracranial abnormality identified. No  findings of hypoxic ischemic injury to the brain at this time. 2. Small chronic infarctions are present in the left frontal lobe and right cerebellar hemisphere. 3. Stable background of mild chronic microvascular ischemic changes and volume loss of the brain. 4. Stable scattered foci of chronic microhemorrhage probably related to chronic hypertension. MRA head: 1. Patent anterior and posterior intracranial circulation. No large vessel occlusion, high-grade stenosis, or aneurysm identified. 2. Atherosclerosis of carotid siphons without significant stenosis. MRA neck: 1. Motion degraded study. 2. Patent carotid and vertebral arteries. 3. Suspected severe proximal right ICA stenosis. Carotid ultrasound recommended to further assess. Electronically Signed   By: Kristine Garbe M.D.   On: 03/27/2018 01:49   [name] Neck Wo Contrast  Result Date: 03/27/2018 CLINICAL DATA:  84 y/o M; cardiac arrest, CPR, ROSC in 5 minutes, total down time 10 minutes. EXAM: MRI HEAD WITHOUT CONTRAST MRA HEAD WITHOUT CONTRAST MRA NECK WITHOUT CONTRAST TECHNIQUE: Multiplanar, multiecho pulse sequences of the brain and surrounding structures were obtained without intravenous contrast. Angiographic images of the Circle of Willis were obtained using MRA technique without intravenous contrast. Angiographic images of the neck were obtained using MRA technique without intravenous contrast. Carotid stenosis measurements (when applicable) are obtained utilizing NASCET criteria, using the distal internal carotid diameter as the denominator. COMPARISON:  02/10/2017 MRA head and neck. 02/09/2017 MRI head. 04/23/2018 CT head and cervical spine. FINDINGS: MRI HEAD FINDINGS Brain: No acute infarction, hemorrhage, hydrocephalus, extra-axial collection or mass lesion. 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 Small chronic infarcts in the right cerebellar hemisphere. There are several foci of susceptibility  hypointensity within the bilateral cerebral hemispheres and the cerebellum compatible with hemosiderin deposition from prior microhemorrhage, grossly stable from prior MRI given differences in technique and motion artifact. There is hemosiderin staining of the right superior cerebellar chronic infarction. Scattered background of nonspecific T2 FLAIR hyperintensities in white matter compatible with mild  chronic microvascular ischemic changes and there is mild volume loss of the brain. Vascular: As below. Skull and upper cervical spine: Normal marrow signal. Sinuses/Orbits: Diffuse paranasal sinus mucosal thickening and fluid levels as well as left mastoid opacification, probably due to intubation. No abnormal signal of the right mastoid air cells. Orbits are unremarkable. Other: None. MRA HEAD FINDINGS Internal carotid arteries: Patent. Carotid siphon irregularity compatible with atherosclerosis. No significant stenosis. Anterior cerebral arteries:  Patent. Middle cerebral arteries: Patent. Anterior communicating artery: Patent. Posterior communicating arteries: Fetal left PCA. No right posterior communicating artery identified, likely hypoplastic or absent. Posterior cerebral arteries: Patent. Small caliber right vertebral artery largely terminates in the right PICA with minimal contribution to the basilar. Basilar artery:  Patent. Vertebral arteries:  Patent. No evidence of high-grade stenosis, large vessel occlusion, or aneurysm unless noted above. MRA NECK FINDINGS Aortic arch: Not included within the field of view. Right common carotid artery: Patent. Right internal carotid artery: Patent. Suspected severe proximal right ICA stenosis. Right vertebral artery: Patent. Left common carotid artery: Patent. Left Internal carotid artery: Patent.Mild irregularity of left proximal ICA compatible with atherosclerosis without significant stenosis. Hairpin turn of mid left cervical ICA. Left Vertebral artery: Patent.  Left  dominant. Motion degraded study. IMPRESSION: MRI head: 1. No acute intracranial abnormality identified. No findings of hypoxic ischemic injury to the brain at this time. 2. Small chronic infarctions are present in the left frontal lobe and right cerebellar hemisphere. 3. Stable background of mild chronic microvascular ischemic changes and volume loss of the brain. 4. Stable scattered foci of chronic microhemorrhage probably related to chronic hypertension. MRA head: 1. Patent anterior and posterior intracranial circulation. No large vessel occlusion, high-grade stenosis, or aneurysm identified. 2. Atherosclerosis of carotid siphons without significant stenosis. MRA neck: 1. Motion degraded study. 2. Patent carotid and vertebral arteries. 3. Suspected severe proximal right ICA stenosis. Carotid ultrasound recommended to further assess. Electronically Signed   By: Kristine Garbe M.D.   On: 03/27/2018 01:49   Mr Brain Wo Contrast  Result Date: 03/27/2018 CLINICAL DATA:  84 y/o M; cardiac arrest, CPR, ROSC in 5 minutes, total down time 10 minutes. EXAM: MRI HEAD WITHOUT CONTRAST MRA HEAD WITHOUT CONTRAST MRA NECK WITHOUT CONTRAST TECHNIQUE: Multiplanar, multiecho pulse sequences of the brain and surrounding structures were obtained without intravenous contrast. Angiographic images of the Circle of Willis were obtained using MRA technique without intravenous contrast. Angiographic images of the neck were obtained using MRA technique without intravenous contrast. Carotid stenosis measurements (when applicable) are obtained utilizing NASCET criteria, using the distal internal carotid diameter as the denominator. COMPARISON:  02/10/2017 MRA head and neck. 02/09/2017 MRI head. 03/25/2018 CT head and cervical spine. FINDINGS: MRI HEAD FINDINGS Brain: No acute infarction, hemorrhage, hydrocephalus, extra-axial collection or mass lesion. 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  Small chronic infarcts in the right cerebellar hemisphere. There are several foci of susceptibility hypointensity within the bilateral cerebral hemispheres and the cerebellum compatible with hemosiderin deposition from prior microhemorrhage, grossly stable from prior MRI given differences in technique and motion artifact. There is hemosiderin staining of the right superior cerebellar chronic infarction. Scattered background of nonspecific T2 FLAIR hyperintensities in white matter compatible with mild chronic microvascular ischemic changes and there is mild volume loss of the brain. Vascular: As below. Skull and upper cervical spine: Normal marrow signal. Sinuses/Orbits: Diffuse paranasal sinus mucosal thickening and fluid levels as well as left mastoid opacification, probably due to  intubation. No abnormal signal of the right mastoid air cells. Orbits are unremarkable. Other: None. MRA HEAD FINDINGS Internal carotid arteries: Patent. Carotid siphon irregularity compatible with atherosclerosis. No significant stenosis. Anterior cerebral arteries:  Patent. Middle cerebral arteries: Patent. Anterior communicating artery: Patent. Posterior communicating arteries: Fetal left PCA. No right posterior communicating artery identified, likely hypoplastic or absent. Posterior cerebral arteries: Patent. Small caliber right vertebral artery largely terminates in the right PICA with minimal contribution to the basilar. Basilar artery:  Patent. Vertebral arteries:  Patent. No evidence of high-grade stenosis, large vessel occlusion, or aneurysm unless noted above. MRA NECK FINDINGS Aortic arch: Not included within the field of view. Right common carotid artery: Patent. Right internal carotid artery: Patent. Suspected severe proximal right ICA stenosis. Right vertebral artery: Patent. Left common carotid artery: Patent. Left Internal carotid artery: Patent.Mild irregularity of left proximal ICA compatible with atherosclerosis  without significant stenosis. Hairpin turn of mid left cervical ICA. Left Vertebral artery: Patent.  Left dominant. Motion degraded study. IMPRESSION: MRI head: 1. No acute intracranial abnormality identified. No findings of hypoxic ischemic injury to the brain at this time. 2. Small chronic infarctions are present in the left frontal lobe and right cerebellar hemisphere. 3. Stable background of mild chronic microvascular ischemic changes and volume loss of the brain. 4. Stable scattered foci of chronic microhemorrhage probably related to chronic hypertension. MRA head: 1. Patent anterior and posterior intracranial circulation. No large vessel occlusion, high-grade stenosis, or aneurysm identified. 2. Atherosclerosis of carotid siphons without significant stenosis. MRA neck: 1. Motion degraded study. 2. Patent carotid and vertebral arteries. 3. Suspected severe proximal right ICA stenosis. Carotid ultrasound recommended to further assess. Electronically Signed   By: Kristine Garbe M.D.   On: 03/27/2018 01:49   Dg Chest Port 1 View  Result Date: 04/01/2018 CLINICAL DATA:  Dyspnea EXAM: PORTABLE CHEST 1 VIEW COMPARISON:  03/30/2018 FINDINGS: Cardiac shadow remains enlarged. Postsurgical changes are again seen. Bilateral pleural effusions are noted increasing on the right when compared with the prior exam. Persistent perihilar opacities are noted. No new focal abnormality is seen. IMPRESSION: Increasing right-sided pleural effusion. The remainder of the exam is stable from the prior study. Electronically Signed   By: Inez Catalina M.D.   On: 04/01/2018 09:53   Dg Chest Port 1 View  Result Date: 03/30/2018 CLINICAL DATA:  Post code and CPR. EXAM: PORTABLE CHEST 1 VIEW COMPARISON:  03/29/2018 FINDINGS: Prior CABG and aortic valve replacement. Cardiomegaly. Bilateral perihilar and lower lobe opacities with small to moderate layering effusions. No significant change since prior study. IMPRESSION: No  interval change. Electronically Signed   By: Rolm Baptise M.D.   On: 03/30/2018 10:56   Dg Chest Port 1 View  Result Date: 03/29/2018 CLINICAL DATA:  Respiratory failure EXAM: PORTABLE CHEST 1 VIEW COMPARISON:  03/28/2018 FINDINGS: Interval removal of endotracheal tube and NG tube. Cardiomegaly. Diffuse bilateral airspace opacities have worsened since prior study, likely worsening edema. Small to moderate layering bilateral effusions, also increased since prior study. IMPRESSION: Interval worsening it in diffuse bilateral airspace disease, likely worsening edema/CHF. Enlarging small to moderate bilateral effusions. Electronically Signed   By: Rolm Baptise M.D.   On: 03/29/2018 08:04   Dg Chest Port 1 View  Result Date: 03/28/2018 CLINICAL DATA:  Cardiac arrest EXAM: PORTABLE CHEST 1 VIEW COMPARISON:  03/27/2018 FINDINGS: Endotracheal and NG tubes are stable. Heart remains enlarged. Upper mediastinum remains prominent. Postoperative changes from CABG. Vascular congestion is stable. Stable haziness at both  lung bases likely represents a combination of volume loss and pleural fluid. No pneumothorax. IMPRESSION: Stable vascular congestion and bibasilar haziness as described. Electronically Signed   By: Marybelle Killings M.D.   On: 03/28/2018 08:02   Dg Chest Port 1 View  Result Date: 03/27/2018 CLINICAL DATA:  Check endotracheal tube placement EXAM: PORTABLE CHEST 1 VIEW COMPARISON:  04/21/2018 FINDINGS: Cardiac shadow remains enlarged. Postsurgical changes are again identified. Endotracheal tube and nasogastric catheter are noted in satisfactory position. The lungs are well aerated with some patchy right basilar opacities new from the prior exam. Small bilateral pleural effusion are noted as well. No pneumothorax is seen. IMPRESSION: Increase in right basilar opacities with small bilateral pleural effusions. Electronically Signed   By: Inez Catalina M.D.   On: 03/27/2018 08:37   Dg Chest Portable 1  View  Result Date: 04/02/2018 CLINICAL DATA:  Intubated EXAM: PORTABLE CHEST 1 VIEW COMPARISON:  02/19/2017, 02/21/2017 FINDINGS: Endotracheal tube tip is about 3.8 cm superior to the carina. Esophageal tube tip is below the diaphragm but non included. Post sternotomy changes. Cardiomegaly with mild central congestion. Similar appearance of left pleural blunting. Aortic atherosclerosis. No pneumothorax. IMPRESSION: 1. Endotracheal tube tip about 3.8 cm superior to the carina. Esophageal tube tip below the diaphragm but non included. 2. Cardiomegaly with vascular congestion Electronically Signed   By: Donavan Foil M.D.   On: 04/09/2018 17:15     LOS: 7 days   Oren Binet, MD  [facility]  If 7PM-7AM, please contact night-coverage  Please page via [email or web address]-click on MD name and type text message  04/02/2018, 2:16 PM

## 2018-04-02 NOTE — Progress Notes (Signed)
Paged MD at this time regarding Pt's nose bleed and bleeding from penis around foley catheter. MD to come to bedside. Will continue to monitor Pt.

## 2018-04-02 NOTE — Progress Notes (Signed)
RT placed patient on 40% venturi mask per MD request. Patient tolerating well at this time. Family and RN at bedside. RT will continue to monitor.

## 2018-04-03 ENCOUNTER — Inpatient Hospital Stay (HOSPITAL_COMMUNITY): Payer: Medicare Other

## 2018-04-03 DIAGNOSIS — R040 Epistaxis: Secondary | ICD-10-CM

## 2018-04-03 LAB — BASIC METABOLIC PANEL
Anion gap: 7 (ref 5–15)
BUN: 19 mg/dL (ref 8–23)
CALCIUM: 8.8 mg/dL — AB (ref 8.9–10.3)
CO2: 39 mmol/L — ABNORMAL HIGH (ref 22–32)
Chloride: 92 mmol/L — ABNORMAL LOW (ref 98–111)
Creatinine, Ser: 0.9 mg/dL (ref 0.61–1.24)
GFR calc Af Amer: >60 mL/min (ref >60)
GLUCOSE: 172 mg/dL — AB (ref 70–99)
Potassium: 3.6 mmol/L (ref 3.5–5.1)
Sodium: 138 mmol/L (ref 135–145)

## 2018-04-03 LAB — GLUCOSE, CAPILLARY
GLUCOSE-CAPILLARY: 188 mg/dL — AB (ref 70–99)
Glucose-Capillary: 136 mg/dL — ABNORMAL HIGH (ref 70–99)
Glucose-Capillary: 185 mg/dL — ABNORMAL HIGH (ref 70–99)
Glucose-Capillary: 182 mg/dL — ABNORMAL HIGH (ref 70–99)

## 2018-04-03 LAB — CBC
HCT: 28.9 % — ABNORMAL LOW (ref 39.0–52.0)
Hemoglobin: 8.9 g/dL — ABNORMAL LOW (ref 13.0–17.0)
MCH: 28.9 pg (ref 26.0–34.0)
MCHC: 30.8 g/dL (ref 30.0–36.0)
MCV: 93.8 fL (ref 80.0–100.0)
PLATELETS: 175 10*3/uL (ref 150–400)
RBC: 3.08 MIL/uL — ABNORMAL LOW (ref 4.22–5.81)
RDW: 14.6 % (ref 11.5–15.5)
WBC: 8.1 10*3/uL (ref 4.0–10.5)
nRBC: 0 % (ref 0.0–0.2)

## 2018-04-03 MED ORDER — FUROSEMIDE 10 MG/ML IJ SOLN
INTRAMUSCULAR | Status: AC
  Filled 2018-04-03: qty 4

## 2018-04-03 MED ORDER — HALOPERIDOL LACTATE 2 MG/ML PO CONC
0.5000 mg | ORAL | Status: DC | PRN
  Filled 2018-04-03: qty 0.3

## 2018-04-03 MED ORDER — LORAZEPAM 1 MG PO TABS: 1.0000 mg | ORAL_TABLET | ORAL | Status: DC | PRN

## 2018-04-03 MED ORDER — GLYCOPYRROLATE 0.2 MG/ML IJ SOLN: 0.4000 mg | Freq: Four times a day (QID) | INTRAMUSCULAR | Status: DC | PRN

## 2018-04-03 MED ORDER — HALOPERIDOL LACTATE 5 MG/ML IJ SOLN: 0.5000 mg | INTRAMUSCULAR | Status: DC | PRN

## 2018-04-03 MED ORDER — LORAZEPAM 2 MG/ML IJ SOLN
1.0000 mg | INTRAMUSCULAR | Status: DC | PRN
  Administered 2018-04-05: 1 mg via INTRAVENOUS
  Filled 2018-04-03: qty 1

## 2018-04-03 MED ORDER — DOXYCYCLINE HYCLATE 100 MG PO TABS
100.0000 mg | ORAL_TABLET | Freq: Two times a day (BID) | ORAL | Status: DC
  Administered 2018-04-03: 100 mg via ORAL
  Filled 2018-04-03: qty 1

## 2018-04-03 MED ORDER — MORPHINE SULFATE (PF) 2 MG/ML IV SOLN
1.0000 mg | INTRAVENOUS | Status: DC | PRN
  Administered 2018-04-05: 1 mg via INTRAVENOUS
  Filled 2018-04-03: qty 1

## 2018-04-03 MED ORDER — HALOPERIDOL 0.5 MG PO TABS
0.5000 mg | ORAL_TABLET | ORAL | Status: DC | PRN
  Filled 2018-04-03: qty 1

## 2018-04-03 MED ORDER — LORAZEPAM 2 MG/ML PO CONC: 1.0000 mg | ORAL | Status: DC | PRN

## 2018-04-03 NOTE — Progress Notes (Signed)
Physical Therapy Treatment Patient Details Name: Terry Wu MRN: [id number] DOB: 05/01/1932 Today's Date: 04/03/2018    History of Present Illness 84 yo presenting as cardiac arrest at home s/p CPR and intubation 10/2-10/4. PMHx: Aortic aneurysm, chronic Afib, CAD s/p CABG/AVR 2006, HTN, type 2 DM, HLD, prostate cancer with radioactive seed implant, colon cancer s/p resection and colostomy 02/07/2017 with post-op CVA, COPD    PT Comments    Pt progressing gait distance slowly.  Bleeding remains from catheter site when moveing.  Pt slow and guarded and continues to fatigue quickly.      Follow Up Recommendations  Home health PT;Supervision for mobility/OOB     Equipment Recommendations  3in1 (PT)    Recommendations for Other Services       Precautions / Restrictions Precautions Precautions: Fall Precaution Comments: watch HR Restrictions Weight Bearing Restrictions: No    Mobility  Bed Mobility               General bed mobility comments: Pt sitting in recliner on arrival.    Transfers Overall transfer level: Needs assistance Equipment used: Rolling walker (2 wheeled) Transfers: Sit to/from Stand Sit to Stand: Min assist;+2 safety/equipment         General transfer comment: cues for hand placement, min assist to rise and steady.  Pt with poor hand placement attempting to pull on RW into standing.    Ambulation/Gait Ambulation/Gait assistance: Min assist;+2 safety/equipment Gait Distance (Feet): 24 Feet Assistive device: Rolling walker (2 wheeled) Gait Pattern/deviations: Step-through pattern;Decreased stride length;Trunk flexed;Wide base of support;Shuffle Gait velocity: decreased    General Gait Details: VSS, cues for encouragement to advance gait distance.  Decreased stride in a shuffling pattern noted.  Cues for increased stride length.     Stairs             Wheelchair Mobility    Modified Rankin (Stroke Patients Only)       Balance  Overall balance assessment: Needs assistance   Sitting balance-Leahy Scale: Fair       Standing balance-Leahy Scale: Poor Standing balance comment: requires B UE support of walker                            Cognition Arousal/Alertness: Awake/alert Behavior During Therapy: Flat affect Overall Cognitive Status: Difficult to assess                                        Exercises      General Comments        Pertinent Vitals/Pain Pain Assessment: Faces Faces Pain Scale: Hurts little more Pain Location: bottom Pain Descriptors / Indicators: Burning Pain Intervention(s): Monitored during session;Repositioned    Home Living                      Prior Function            PT Goals (current goals can now be found in the care plan section) Acute Rehab PT Goals Patient Stated Goal: to be able to go home. Potential to Achieve Goals: Good Progress towards PT goals: Progressing toward goals    Frequency    Min 3X/week      PT Plan Current plan remains appropriate    Co-evaluation              AM-PAC PT "  6 Clicks" Daily Activity  Outcome Measure  Difficulty turning over in bed (including adjusting bedclothes, sheets and blankets)?: Unable   Difficulty sitting down on and standing up from a chair with arms (e.g., wheelchair, bedside commode, etc,.)?: Unable Help needed moving to and from a bed to chair (including a wheelchair)?: A Little Help needed walking in hospital room?: A Little Help needed climbing 3-5 steps with a railing? : A Lot 6 Click Score: 10    End of Session Equipment Utilized During Treatment: Gait belt;Oxygen(6L venti mask) Activity Tolerance: Patient tolerated treatment well Patient left: in chair;with call bell/phone within reach;with chair alarm set;with family/visitor present Nurse Communication: Mobility status PT Visit Diagnosis: Unsteadiness on feet (R26.81);Other abnormalities of gait and  mobility (R26.89);History of falling (Z91.81);Muscle weakness (generalized) (M62.81)     Time: 4665-9935 PT Time Calculation (min) (ACUTE ONLY): 20 min  Charges:  $Gait Training: 8-22 mins                     Governor Rooks, PTA Acute Rehabilitation Services Pager [phone] Office [phone]     Cristela Blue 04/03/2018, 5:16 PM

## 2018-04-03 NOTE — Consult Note (Signed)
Consultation Note Date: 04/03/2018   Patient Name: Terry Wu  DOB: 1932-04-03  MRN: [id number]  Age / Sex: 84 y.o., male  PCP: Terry Du, MD Referring Physician: Jonetta Osgood, MD  Reason for Consultation: Establishing goals of care and Psychosocial/spiritual support  HPI/Patient Profile: 84 y.o. male  with past medical history of severe COPD, CAD/CABG, Aortic Valve replacement, colon cancer s/p resection in 2018, CVA, atrial fibrillation, DM who was admitted on 04/07/2018 with cardiac arrest.  The patient required 5 minutes of resuscitation prior to [place].  He was intubated in the ER for airway protection.  He has since been extubated, but has suffered with recurrent hypercarbia despite treatment with BiPap.  His hospitalization has also been complicated by diastolic heart failure decompensation and severe epistaxis.  He is now off anticoagulation due to epistaxis and hematuria.   Clinical Assessment and Goals of Care:  I have reviewed medical records including EPIC notes, labs and imaging, received report from Dr. Sloan Wu, assessed the patient and then met at the bedside along with his wife and daughter  to discuss diagnosis prognosis, GOC, EOL wishes, disposition and options.  I introduced Palliative Medicine as specialized medical care for people living with serious illness. It focuses on providing relief from the symptoms and stress of a serious illness. The goal is to improve quality of life for both the patient and the family.  We discussed a brief life review of the patient.  He was a Engineer, maintenance (IT) and has been married to Terry Wu for over 60 years.  They have two children,  Terry Wu (son) from [place], [place] and a daughter who is in the hospital room.  The family is from [place].  When I met Terry Wu he was on a venti mask and awake but confused.  I spoke briefly with his wife and daughter  outside the room.  His wife immediately commented - "I wish I had let him go last Wednesday.  He dropped his head and he was asleep.  It would have been very peaceful."  She told me that he has suffered thru colon cancer and multiple surgeries and illnesses with no complaint.  She tells me he is BRAVE.    Terry Wu wife and daughter understand that he is very fragile and that without the "mask" he will quickly become hypercarbic again.  We discussed the options of going home or Hospice House.  Terry Wu can not care for him at home and she does not want him transferred to hospice house.  Then we discussed instituting comfort care in the hospital.  Terry Wu made it very clear that she does not want her husband to suffer when he passes.  She wants every measure taken to ensure that he is very comfortable.  She asked if she could gather the family and talk with PMT tomorrow.  I agreed but cautioned her that he is fragile and it would not be surprising if he declined prior to tomorrow.  She understood.  She wanted her  son Terry Wu to come from [facility] to make plans as a family.  Terry Wu called me later in the day (1:00 pm).  He stated that there is no need for a "Meeting".  The family is all in agreement.  They would like to allow the grandchildren to visit tomorrow afternoon (around 3:30 or 4:00 - they are coming from [facility]) and then institute full comfort measures after the grandchildren leave in the evening.    Terry Wu his mothers sentiment that he wants his fathers death to be very peaceful with no suffering, fear or pain.  I asked Terry Wu - if your father declines tonight before the grandchildren have visited what should we do?  Terry Wu stated that he and his mother are in agreement - Terry Wu comfort is the priority - a mask to breathe with is ok, but if he is in any pain or respiratory distress, please give him medication to make him comfortable.  I explained that any comfort medication would likely cause  his father to go to sleep and he would likely not wake up.   Terry Wu understood and agreed.  [place] want the patient comfortable.   Primary Decision Maker:  NEXT OF KIN Wife, Terry Wu    SUMMARY OF RECOMMENDATIONS    Trend towards comfort.  Attempt to maintain current status overnight so that grand children from [facility] can visit tomorrow late afternoon.  After the visit family wants to shift to full comfort only measures.  They emphasized that they do not want Terry Wu to experience pain, fear or respiratory distress.     If he declines before the visit tomorrow they are OK with BiPAP but his comfort is the priority.  If he needs comfort medications prior to the visit please ensure he receives them.  Code Status/Advance Care Planning:  DNR.   Symptom Management:   PRN comfort medications (ativan, morphine, robinul)  Additional Recommendations (Limitations, Scope, Preferences):  No Blood Transfusions, No Diagnostics, No IV Fluids and No Lab Draws.   TRENDING TOWARDS COMFORT.    Psycho-social/Spiritual:   Desire for further Chaplaincy support: yes requested.  Prognosis:  Hours to days given recurrent hypercarbic respiratory failure and recent cardiac arrest.    Discharge Planning: Anticipated Hospital Death      Primary Diagnoses: Present on Admission: . Cardiac arrest ([place])   I have reviewed the medical record, interviewed the patient and family, and examined the patient. The following aspects are pertinent.  Past Medical History:  Diagnosis Date  . Cancer ([place])   . COPD (chronic obstructive pulmonary disease) ([place])   . Diabetes mellitus without complication ([place])   . Hypertension    Social History   Socioeconomic History  . Marital status: Married    Spouse name: Not on file  . Number of children: Not on file  . Years of education: Not on file  . Highest education level: Not on file  Occupational History  . Not on file  Social Needs  . Financial resource  strain: Not on file  . Food insecurity:    Worry: Not on file    Inability: Not on file  . Transportation needs:    Medical: Not on file    Non-medical: Not on file  Tobacco Use  . Smoking status: Never Smoker  . Smokeless tobacco: Never Used  Substance and Sexual Activity  . Alcohol use: Not Currently  . Drug use: Never  . Sexual activity: Not Currently  Lifestyle  . Physical activity:  Days per week: Not on file    Minutes per session: Not on file  . Stress: Not on file  Relationships  . Social connections:    Talks on phone: Not on file    Gets together: Not on file    Attends religious service: Not on file    Active member of club or organization: Not on file    Attends meetings of clubs or organizations: Not on file    Relationship status: Not on file  Other Topics Concern  . Not on file  Social History Narrative  . Not on file   History reviewed. No pertinent family history. Scheduled Meds: . carvedilol  3.125 mg Oral BID WC  . chlorhexidine  15 mL Mouth Rinse BID  . feeding supplement (PRO-STAT SUGAR FREE 64)  30 mL Oral BID  . furosemide  60 mg Intravenous Q8H  . insulin aspart  0-15 Units Subcutaneous TID WC  . mouth rinse  15 mL Mouth Rinse q12n4p  . oxymetazoline  1 spray Each Nare BID  . pantoprazole  40 mg Oral Daily   Continuous Infusions: PRN Meds:.bisacodyl, docusate, haloperidol **OR** haloperidol **OR** haloperidol lactate, ipratropium-albuterol, LORazepam **OR** LORazepam **OR** LORazepam, morphine injection Allergies  Allergen Reactions  . Asa [Aspirin] Anaphylaxis and Swelling    Throat swells   . Demerol [Meperidine Hcl] Shortness Of Breath, Swelling and Other (See Comments)    "Suppresses vagus nerve" (occurred during a procedure)   Review of Systems patient confused  Physical Exam  Well developed elderly male, on venti mask, awake, mildly confused, NAD CV irreg irreg with systolic murmur Abdomen soft, nt, obese Lower extremities  with changes of venous stasis and extensive moist scabbing.  Vital Signs: BP 128/77 (BP Location: Right Arm)   Pulse 86   Temp 97.9 F (36.6 C) (Oral)   Resp 20   Ht _0  (1.753 m)   Wt 112.3 kg   SpO2 98%   BMI 36.56 kg/m  Pain Scale: 0-10   Pain Score: 0-No pain   SpO2: SpO2: 98 % O2 Device:SpO2: 98 % O2 Flow Rate: .O2 Flow Rate (L/min): 6 L/min  IO: Intake/output summary:   Intake/Output Summary (Last 24 hours) at 04/03/2018 1403 Last data filed at 04/03/2018 0900 Gross per 24 hour  Intake 1260 ml  Output 200 ml  Net 1060 ml    LBM: Last BM Date: 04/02/18 Baseline Weight: Weight: 99.8 kg Most recent weight: Weight: 112.3 kg     Palliative Assessment/Data: 20%     Time In: 9:00 Time Out: 10:10 Time Total: 70 min. Greater than 50%  of this time was spent counseling and coordinating care related to the above assessment and plan.  Signed by: Florentina Jenny, PA-C Palliative Medicine Pager: [phone]  Please contact Palliative Medicine Team phone at [phone] for questions and concerns.  For individual provider: See Amion  My colleague Mariana Kaufman, NP will be rounding on the [place] family on 10/11.            \

## 2018-04-03 NOTE — Progress Notes (Signed)
SLP Cancellation Note  Patient Details Name: Terry Wu MRN: [id number] DOB: [birth year]   Cancelled treatment:       MBS cancelled for today. Pt/family are transitioning to comfort care and do not desire completion of MBS. SLP met with family briefly to answer questions and provide education. ST signing off at this time. Please reconsult if needs arise.  [name] [facility], [place] Speech Language Pathologist [phone]  [name] 04/03/2018, 10:30 AM

## 2018-04-03 NOTE — Progress Notes (Signed)
PROGRESS NOTE        PATIENT DETAILS Name: Terry Wu Age: 84 y.o. Sex: male Date of Birth: April 28, 1932 Admit Date: 04/06/2018 Admitting Physician Kandice Hams, MD YDX:AJOINOM, Percell Miller, MD  Brief Narrative: Patient is a 84 y.o. male past medical history of CAD status post CABG/aortic valve replacement in 2006, history of colon cancer status post resection/colostomy in 7672 complicated by postoperative stroke, A. fib on Eliquis-admitted to the hospital on 10/2 following a cardiac arrest with return of spontaneous circulation in approximately 5 minutes.  No shocks were administered.  Patient was intubated in the emergency room for airway protection, subsequently managed in the intensive care unit stabilized and transferred to the hospitalist service.  Subsequent hospital course has been complicated by hypercarbic respiratory failure, decompensated diastolic heart failure, and now likely epistaxis and hematuria.  See below for further details  Subjective: Just does not feel good-no epistaxis-however no suspect.  Hematuria continues.  Denies any worsening shortness of breath or chest pain this morning.  Assessment/Plan: Cardiac arrest: Reviewed H&P/ED notes-had approximately 5 minutes of downtime with spontaneous return of circulation and approximately 5 minutes.  Shocks were administered.  Suspect that this may have been a respiratory event causing cardiac arrest.  Echocardiogram with preserved EF.  Telemetry showing A. fib.  He is now a DNR.  Acute on chronic diastolic heart failure: Remains volume overloaded-although improved-continue IV Lasix, continue to follow electrolytes, weights, intake/output.    Acute hypercarbic respiratory failure: Worsened on 10/8 with encephalopathy requiring BiPAP.  Probably related to underlying COPD/decompensated CHF-probably has underlying OSA/OHS.  Continue BiPAP nightly once epistaxis resolves.    MSSA pneumonia: Sputum  cultures positive for MSSA-remains on doxycycline.    Right-sided pleural effusion: Suspect secondary to heart failure-continue IV Lasix-if it becomes hypoxic or worsens-we can consider thoracocentesis.    Epistaxis: Occurred on 10/9-Eliquis stopped-since kept on recurring-ENT consulted and nasal packing placed.  Continue to hold Eliquis for now.   Hematuria: Secondary to Foley trauma-this occurred on 10/9-holding Eliquis-continue to flush periodically.  If it does not resolve in the next day or so, we will begin bladder irrigation.  PAF: A. fib on telemetry, continue Coreg-due to persistent epistaxis and hematuria-anticoagulation on hold.  Difficult situation-family aware of the risk of potential of CVA when off anticoagulation.  Hyponatremia: Secondary to hypervolemia-resolved with diuretics  Hypokalemia: Resolved with repletion-follow periodically.  Normocytic anemia: Probably secondary to acute illness-no indication for transfusion as of now-follow.  DM-2: CBG stable-continue SSI  Hypertension: Controlled continue Coreg and Lasix.  COPD: Lungs without rhonchi-has some bibasilar rales-continue bronchodilators-no role for steroids at this time.  GERD: Continue PPI  Dysphagia: Remains on a dysphagia 3 diet-speech therapy planning a modified barium swallow at some point.  Palliative care: DNR-unfortunately not progressing well-multiple issues with ongoing respiratory failure, epistaxis, hematuria-significant medical problems-underlying frailty/failure to thrive syndrome-advanced age-overall prognosis is poor-may be best served with slowly transitioning to comfort care if he still does not progress over the next day or so.  Long discussion with spouse and daughter at bedside.  Palliative care consulted today.  DVT Prophylaxis: Eliquis held on 10/9  Code Status: DNR  Family Communication: Spouse at bedside  Disposition Plan: Remain inpatient-requires several more days of hospital  stay before discharge.  Antimicrobial agents: Anti-infectives (From admission, onward)   Start     Dose/Rate Route Frequency  Ordered Stop   04/03/18 1000  doxycycline (VIBRA-TABS) tablet 100 mg  Status:  Discontinued     100 mg Oral Every 12 hours 04/03/18 0915 04/03/18 1356   04/01/18 1200  doxycycline (VIBRAMYCIN) 100 mg in sodium chloride 0.9 % 250 mL IVPB  Status:  Discontinued     100 mg 125 mL/hr over 120 Minutes Intravenous Every 12 hours 04/01/18 1122 04/03/18 0914   03/30/18 1030  doxycycline (VIBRA-TABS) tablet 100 mg  Status:  Discontinued     100 mg Oral Every 12 hours 03/30/18 1014 04/01/18 1122   03/29/18 1400  vancomycin (VANCOCIN) 1,250 mg in sodium chloride 0.9 % 250 mL IVPB  Status:  Discontinued     1,250 mg 166.7 mL/hr over 90 Minutes Intravenous Every 24 hours 03/28/18 1131 03/30/18 1014   03/28/18 1230  vancomycin (VANCOCIN) 2,000 mg in sodium chloride 0.9 % 500 mL IVPB     2,000 mg 250 mL/hr over 120 Minutes Intravenous  Once 03/28/18 1131 03/28/18 1557      Procedures: 10/2>> ETT  CONSULTS: PCCM ENT Palliative care  Time spent: 35 min-Greater than 50% of this time was spent in counseling, explanation of diagnosis, planning of further management, and coordination of care.  MEDICATIONS: Scheduled Meds: . carvedilol  3.125 mg Oral BID WC  . chlorhexidine  15 mL Mouth Rinse BID  . feeding supplement (PRO-STAT SUGAR FREE 64)  30 mL Oral BID  . furosemide  60 mg Intravenous Q8H  . insulin aspart  0-15 Units Subcutaneous TID WC  . mouth rinse  15 mL Mouth Rinse q12n4p  . oxymetazoline  1 spray Each Nare BID  . pantoprazole  40 mg Oral Daily   Continuous Infusions:  PRN Meds:.bisacodyl, docusate, haloperidol **OR** haloperidol **OR** haloperidol lactate, ipratropium-albuterol, LORazepam **OR** LORazepam **OR** LORazepam, morphine injection   PHYSICAL EXAM: Vital signs: Vitals:   04/02/18 2116 04/03/18 0015 04/03/18 0823 04/03/18 1201  BP:        Pulse:      Resp:      Temp:  97.8 F (36.6 C)  97.9 F (36.6 C)  TempSrc:  Oral  Oral  SpO2: 100%  98%   Weight:      Height:       Filed Weights   03/29/18 0546 03/30/18 0600 03/31/18 0500  Weight: 111.1 kg 110.7 kg 112.3 kg   Body mass index is 36.56 kg/m.   General appearance:Awake, alert, on Ventimask due to epistaxis unable to use nasal-looks very frail and chronically sick appearing Eyes:no scleral icterus. HEENT: Atraumatic and Normocephalic Neck: supple Resp:Good air entry bilaterally, bibasilar rales CVS: S1 S2 regular, no murmurs.  GI: Bowel sounds present, Non tender and not distended with no gaurding, rigidity or rebound. Extremities: B/L Lower Ext shows 1+ edema Neurology: Generalized weakness but nonfocal Musculoskeletal:No digital cyanosis Skin:No Rash, warm and dry Wounds:N/A  I have personally reviewed following labs and imaging studies  LABORATORY DATA: CBC: Recent Labs  Lab 03/28/18 0352 03/29/18 0240 03/30/18 0230 03/31/18 0420 04/02/18 0520 04/03/18 0403  WBC 12.4* 12.3* 11.5* 9.9 6.9 8.1  NEUTROABS 10.1*  --   --   --   --   --   HGB 9.4* 9.3* 9.2* 9.3* 8.5* 8.9*  HCT 28.9* 29.9* 29.9* 29.7* 28.1* 28.9*  MCV 90.3 92.9 93.4 94.0 93.4 93.8  PLT 189 166 174 168 163 528    Basic Metabolic Panel: Recent Labs  Lab 03/28/18 0352 03/29/18 0240 03/30/18 0230 03/31/18 0420 04/01/18 4132  04/02/18 0520 04/03/18 0403  NA 131* 132* 134* 134* 135 137 138  K 3.4* 3.4* 4.0 4.2 4.2 3.7 3.6  CL 92* 94* 95* 95* 92* 93* 92*  CO2 28 30 31 31  33* 35* 39*  GLUCOSE 95 152* 127* 152* 144* 118* 172*  BUN 13 11 11 11 14 18 19   CREATININE 0.96 0.86 0.81 0.85 0.92 0.96 0.90  CALCIUM 8.1* 7.8* 8.2* 8.2* 8.6* 8.4* 8.8*  MG 1.8 2.1  --   --   --   --   --   PHOS 3.1 3.5  --   --   --   --   --     GFR: Estimated Creatinine Clearance: 75.4 mL/min (by C-G formula based on SCr of 0.9 mg/dL).  Liver Function Tests: Recent Labs  Lab 03/29/18 0240  AST  22  ALT 33  ALKPHOS 32*  BILITOT 0.9  PROT 5.8*  ALBUMIN 2.4*   No results for input(s): LIPASE, AMYLASE in the last 168 hours. No results for input(s): AMMONIA in the last 168 hours.  Coagulation Profile: No results for input(s): INR, PROTIME in the last 168 hours.  Cardiac Enzymes: No results for input(s): CKTOTAL, CKMB, CKMBINDEX, TROPONINI in the last 168 hours.  BNP (last 3 results) No results for input(s): PROBNP in the last 8760 hours.  HbA1C: No results for input(s): HGBA1C in the last 72 hours.  CBG: Recent Labs  Lab 04/02/18 1228 04/02/18 1709 04/02/18 2122 04/03/18 0755 04/03/18 1158  GLUCAP 177* 176* 212* 136* 185*    Lipid Profile: No results for input(s): CHOL, HDL, LDLCALC, TRIG, CHOLHDL, LDLDIRECT in the last 72 hours.  Thyroid Function Tests: No results for input(s): TSH, T4TOTAL, FREET4, T3FREE, THYROIDAB in the last 72 hours.  Anemia Panel: No results for input(s): VITAMINB12, FOLATE, FERRITIN, TIBC, IRON, RETICCTPCT in the last 72 hours.  Urine analysis:    Component Value Date/Time   COLORURINE YELLOW 03/25/2018 1710   APPEARANCEUR HAZY (A) 04/02/2018 1710   LABSPEC 1.013 04/01/2018 1710   PHURINE 5.0 04/24/2018 1710   GLUCOSEU NEGATIVE 04/04/2018 1710   HGBUR LARGE (A) 04/21/2018 1710   BILIRUBINUR NEGATIVE 04/23/2018 1710   KETONESUR NEGATIVE 04/14/2018 1710   PROTEINUR 100 (A) 03/30/2018 1710   NITRITE NEGATIVE 04/13/2018 1710   LEUKOCYTESUR NEGATIVE 04/15/2018 1710    Sepsis Labs: Lactic Acid, Venous    Component Value Date/Time   LATICACIDVEN 1.81 04/10/2018 1809    MICROBIOLOGY: Recent Results (from the past 240 hour(s))  Culture, blood (routine x 2)     Status: None   Collection Time: 04/02/2018  6:16 PM  Result Value Ref Range Status   Specimen Description BLOOD RIGHT ARM  Final   Special Requests   Final    BOTTLES DRAWN AEROBIC AND ANAEROBIC Blood Culture adequate volume   Culture   Final    NO GROWTH 5  DAYS Performed at [facility], [place] [address], [place], [place] [address]    Report Status 03/31/2018 FINAL  Final  MRSA PCR Screening     Status: None   Collection Time: 04/08/2018  9:50 PM  Result Value Ref Range Status   MRSA by PCR NEGATIVE NEGATIVE Final    Comment:        The GeneXpert MRSA Assay (FDA approved for NASAL specimens only), is one component of a comprehensive MRSA colonization surveillance program. It is not intended to diagnose MRSA infection nor to guide or monitor treatment for MRSA infections. Performed  at [facility], [place] [address], [place], [place] [address]   Culture, blood (routine x 2)     Status: None   Collection Time: 04/13/2018 10:15 PM  Result Value Ref Range Status   Specimen Description BLOOD RIGHT HAND  Final   Special Requests   Final    BOTTLES DRAWN AEROBIC ONLY Blood Culture adequate volume   Culture   Final    NO GROWTH 5 DAYS Performed at [facility], [place] [address], [place], [place] [address]    Report Status 03/31/2018 FINAL  Final  Culture, respiratory (non-expectorated)     Status: None   Collection Time: 03/27/18 11:29 AM  Result Value Ref Range Status   Specimen Description TRACHEAL ASPIRATE  Final   Special Requests NONE  Final   Gram Stain   Final    NO SQUAMOUS EPITHELIAL CELLS PRESENT ABUNDANT WBC PRESENT, PREDOMINANTLY PMN RARE GRAM POSITIVE COCCI Performed at [facility], [place] [address], [place], [place] [address]    Culture FEW STAPHYLOCOCCUS AUREUS  Final   Report Status 03/29/2018 FINAL  Final   Organism ID, Bacteria STAPHYLOCOCCUS AUREUS  Final      Susceptibility   Staphylococcus aureus - MIC*    CIPROFLOXACIN <=0.5 SENSITIVE Sensitive     ERYTHROMYCIN >=8 RESISTANT Resistant     GENTAMICIN <=0.5 SENSITIVE Sensitive     OXACILLIN 0.5 SENSITIVE Sensitive     TETRACYCLINE <=1 SENSITIVE Sensitive     VANCOMYCIN 1 SENSITIVE Sensitive     TRIMETH/SULFA <=10 SENSITIVE Sensitive      CLINDAMYCIN <=0.25 SENSITIVE Sensitive     RIFAMPIN <=0.5 SENSITIVE Sensitive     Inducible Clindamycin NEGATIVE Sensitive     * FEW STAPHYLOCOCCUS AUREUS    RADIOLOGY STUDIES/RESULTS: Ct Head Wo Contrast  Result Date: 04/09/2018 CLINICAL DATA:  C-spine trauma.  Cardiac arrest.  CPR for 5 minutes. EXAM: CT HEAD WITHOUT CONTRAST CT CERVICAL SPINE WITHOUT CONTRAST TECHNIQUE: Multidetector CT imaging of the head and cervical spine was performed following the standard protocol without intravenous contrast. Multiplanar CT image reconstructions of the cervical spine were also generated. COMPARISON:  CT scan February 21, 2017 FINDINGS: CT HEAD FINDINGS Brain: No subdural, epidural, or subarachnoid hemorrhage. Cerebellum demonstrates no acute abnormalities. A tiny lacunar infarct is seen in the right cerebellar hemisphere on axial image 8. Basal cisterns are unchanged unremarkable. Ventricles and sulci are stable. No acute cortical ischemia or infarct. No mass effect or midline shift. Vascular: Calcified atherosclerosis in the intracranial carotids. Skull: Normal. Negative for fracture or focal lesion. Sinuses/Orbits: There is opacification of scattered left mastoid air cells, improved since February 21, 2017. The right mastoid air cells, middle ears are normal. Scattered opacification of ethmoid air cells with fluid in the sphenoid sinuses and a mucous retention cyst in the left maxillary sinus. Other: None. CT CERVICAL SPINE FINDINGS Alignment: Normal. Skull base and vertebrae: No acute fracture. No primary bone lesion or focal pathologic process. Soft tissues and spinal canal: No prevertebral fluid or swelling. No visible canal hematoma. Disc levels:  Multilevel degenerative changes. Upper chest: Mild opacity in the posterior aspect of the right upper lobe. Other: No other abnormalities. IMPRESSION: 1. No acute intracranial abnormalities. 2. No fracture or traumatic malalignment cervical spine. Multilevel  degenerative changes. 3. Sinus disease as above. Electronically Signed   By: Dorise Bullion III M.D   On: 04/08/2018 19:02   Ct Cervical Spine Wo Contrast  Result Date: 04/13/2018 CLINICAL DATA:  C-spine trauma.  Cardiac arrest.  CPR for 5 minutes. EXAM: CT HEAD WITHOUT CONTRAST CT CERVICAL SPINE WITHOUT CONTRAST TECHNIQUE: Multidetector CT imaging of the head and cervical spine was performed following the standard protocol without intravenous contrast. Multiplanar CT image reconstructions of the cervical spine were also generated. COMPARISON:  CT scan February 21, 2017 FINDINGS: CT HEAD FINDINGS Brain: No subdural, epidural, or subarachnoid hemorrhage. Cerebellum demonstrates no acute abnormalities. A tiny lacunar infarct is seen in the right cerebellar hemisphere on axial image 8. Basal cisterns are unchanged unremarkable. Ventricles and sulci are stable. No acute cortical ischemia or infarct. No mass effect or midline shift. Vascular: Calcified atherosclerosis in the intracranial carotids. Skull: Normal. Negative for fracture or focal lesion. Sinuses/Orbits: There is opacification of scattered left mastoid air cells, improved since February 21, 2017. The right mastoid air cells, middle ears are normal. Scattered opacification of ethmoid air cells with fluid in the sphenoid sinuses and a mucous retention cyst in the left maxillary sinus. Other: None. CT CERVICAL SPINE FINDINGS Alignment: Normal. Skull base and vertebrae: No acute fracture. No primary bone lesion or focal pathologic process. Soft tissues and spinal canal: No prevertebral fluid or swelling. No visible canal hematoma. Disc levels:  Multilevel degenerative changes. Upper chest: Mild opacity in the posterior aspect of the right upper lobe. Other: No other abnormalities. IMPRESSION: 1. No acute intracranial abnormalities. 2. No fracture or traumatic malalignment cervical spine. Multilevel degenerative changes. 3. Sinus disease as above. Electronically  Signed   By: Dorise Bullion III M.D   On: 04/14/2018 19:02   [name] Head Wo Contrast  Result Date: 03/27/2018 CLINICAL DATA:  84 y/o M; cardiac arrest, CPR, ROSC in 5 minutes, total down time 10 minutes. EXAM: MRI HEAD WITHOUT CONTRAST MRA HEAD WITHOUT CONTRAST MRA NECK WITHOUT CONTRAST TECHNIQUE: Multiplanar, multiecho pulse sequences of the brain and surrounding structures were obtained without intravenous contrast. Angiographic images of the Circle of Willis were obtained using MRA technique without intravenous contrast. Angiographic images of the neck were obtained using MRA technique without intravenous contrast. Carotid stenosis measurements (when applicable) are obtained utilizing NASCET criteria, using the distal internal carotid diameter as the denominator. COMPARISON:  02/10/2017 MRA head and neck. 02/09/2017 MRI head. 03/29/2018 CT head and cervical spine. FINDINGS: MRI HEAD FINDINGS Brain: No acute infarction, hemorrhage, hydrocephalus, extra-axial collection or mass lesion. 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 Small chronic infarcts in the right cerebellar hemisphere. There are several foci of susceptibility hypointensity within the bilateral cerebral hemispheres and the cerebellum compatible with hemosiderin deposition from prior microhemorrhage, grossly stable from prior MRI given differences in technique and motion artifact. There is hemosiderin staining of the right superior cerebellar chronic infarction. Scattered background of nonspecific T2 FLAIR hyperintensities in white matter compatible with mild chronic microvascular ischemic changes and there is mild volume loss of the brain. Vascular: As below. Skull and upper cervical spine: Normal marrow signal. Sinuses/Orbits: Diffuse paranasal sinus mucosal thickening and fluid levels as well as left mastoid opacification, probably due to intubation. No abnormal signal of the right mastoid air cells. Orbits  are unremarkable. Other: None. MRA HEAD FINDINGS Internal carotid arteries: Patent. Carotid siphon irregularity compatible with atherosclerosis. No significant stenosis. Anterior cerebral arteries:  Patent. Middle cerebral arteries: Patent. Anterior communicating artery: Patent. Posterior communicating arteries: Fetal left PCA. No right posterior communicating artery identified, likely hypoplastic or absent. Posterior cerebral arteries: Patent. Small caliber right vertebral artery largely terminates in the  right PICA with minimal contribution to the basilar. Basilar artery:  Patent. Vertebral arteries:  Patent. No evidence of high-grade stenosis, large vessel occlusion, or aneurysm unless noted above. MRA NECK FINDINGS Aortic arch: Not included within the field of view. Right common carotid artery: Patent. Right internal carotid artery: Patent. Suspected severe proximal right ICA stenosis. Right vertebral artery: Patent. Left common carotid artery: Patent. Left Internal carotid artery: Patent.Mild irregularity of left proximal ICA compatible with atherosclerosis without significant stenosis. Hairpin turn of mid left cervical ICA. Left Vertebral artery: Patent.  Left dominant. Motion degraded study. IMPRESSION: MRI head: 1. No acute intracranial abnormality identified. No findings of hypoxic ischemic injury to the brain at this time. 2. Small chronic infarctions are present in the left frontal lobe and right cerebellar hemisphere. 3. Stable background of mild chronic microvascular ischemic changes and volume loss of the brain. 4. Stable scattered foci of chronic microhemorrhage probably related to chronic hypertension. MRA head: 1. Patent anterior and posterior intracranial circulation. No large vessel occlusion, high-grade stenosis, or aneurysm identified. 2. Atherosclerosis of carotid siphons without significant stenosis. MRA neck: 1. Motion degraded study. 2. Patent carotid and vertebral arteries. 3. Suspected  severe proximal right ICA stenosis. Carotid ultrasound recommended to further assess. Electronically Signed   By: Kristine Garbe M.D.   On: 03/27/2018 01:49   [name] Neck Wo Contrast  Result Date: 03/27/2018 CLINICAL DATA:  84 y/o M; cardiac arrest, CPR, ROSC in 5 minutes, total down time 10 minutes. EXAM: MRI HEAD WITHOUT CONTRAST MRA HEAD WITHOUT CONTRAST MRA NECK WITHOUT CONTRAST TECHNIQUE: Multiplanar, multiecho pulse sequences of the brain and surrounding structures were obtained without intravenous contrast. Angiographic images of the Circle of Willis were obtained using MRA technique without intravenous contrast. Angiographic images of the neck were obtained using MRA technique without intravenous contrast. Carotid stenosis measurements (when applicable) are obtained utilizing NASCET criteria, using the distal internal carotid diameter as the denominator. COMPARISON:  02/10/2017 MRA head and neck. 02/09/2017 MRI head. 04/21/2018 CT head and cervical spine. FINDINGS: MRI HEAD FINDINGS Brain: No acute infarction, hemorrhage, hydrocephalus, extra-axial collection or mass lesion. 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 Small chronic infarcts in the right cerebellar hemisphere. There are several foci of susceptibility hypointensity within the bilateral cerebral hemispheres and the cerebellum compatible with hemosiderin deposition from prior microhemorrhage, grossly stable from prior MRI given differences in technique and motion artifact. There is hemosiderin staining of the right superior cerebellar chronic infarction. Scattered background of nonspecific T2 FLAIR hyperintensities in white matter compatible with mild chronic microvascular ischemic changes and there is mild volume loss of the brain. Vascular: As below. Skull and upper cervical spine: Normal marrow signal. Sinuses/Orbits: Diffuse paranasal sinus mucosal thickening and fluid levels as well as left  mastoid opacification, probably due to intubation. No abnormal signal of the right mastoid air cells. Orbits are unremarkable. Other: None. MRA HEAD FINDINGS Internal carotid arteries: Patent. Carotid siphon irregularity compatible with atherosclerosis. No significant stenosis. Anterior cerebral arteries:  Patent. Middle cerebral arteries: Patent. Anterior communicating artery: Patent. Posterior communicating arteries: Fetal left PCA. No right posterior communicating artery identified, likely hypoplastic or absent. Posterior cerebral arteries: Patent. Small caliber right vertebral artery largely terminates in the right PICA with minimal contribution to the basilar. Basilar artery:  Patent. Vertebral arteries:  Patent. No evidence of high-grade stenosis, large vessel occlusion, or aneurysm unless noted above. MRA NECK FINDINGS Aortic arch: Not included within the field of view.  Right common carotid artery: Patent. Right internal carotid artery: Patent. Suspected severe proximal right ICA stenosis. Right vertebral artery: Patent. Left common carotid artery: Patent. Left Internal carotid artery: Patent.Mild irregularity of left proximal ICA compatible with atherosclerosis without significant stenosis. Hairpin turn of mid left cervical ICA. Left Vertebral artery: Patent.  Left dominant. Motion degraded study. IMPRESSION: MRI head: 1. No acute intracranial abnormality identified. No findings of hypoxic ischemic injury to the brain at this time. 2. Small chronic infarctions are present in the left frontal lobe and right cerebellar hemisphere. 3. Stable background of mild chronic microvascular ischemic changes and volume loss of the brain. 4. Stable scattered foci of chronic microhemorrhage probably related to chronic hypertension. MRA head: 1. Patent anterior and posterior intracranial circulation. No large vessel occlusion, high-grade stenosis, or aneurysm identified. 2. Atherosclerosis of carotid siphons without  significant stenosis. MRA neck: 1. Motion degraded study. 2. Patent carotid and vertebral arteries. 3. Suspected severe proximal right ICA stenosis. Carotid ultrasound recommended to further assess. Electronically Signed   By: Kristine Garbe M.D.   On: 03/27/2018 01:49   Mr Brain Wo Contrast  Result Date: 03/27/2018 CLINICAL DATA:  84 y/o M; cardiac arrest, CPR, ROSC in 5 minutes, total down time 10 minutes. EXAM: MRI HEAD WITHOUT CONTRAST MRA HEAD WITHOUT CONTRAST MRA NECK WITHOUT CONTRAST TECHNIQUE: Multiplanar, multiecho pulse sequences of the brain and surrounding structures were obtained without intravenous contrast. Angiographic images of the Circle of Willis were obtained using MRA technique without intravenous contrast. Angiographic images of the neck were obtained using MRA technique without intravenous contrast. Carotid stenosis measurements (when applicable) are obtained utilizing NASCET criteria, using the distal internal carotid diameter as the denominator. COMPARISON:  02/10/2017 MRA head and neck. 02/09/2017 MRI head. 04/11/2018 CT head and cervical spine. FINDINGS: MRI HEAD FINDINGS Brain: No acute infarction, hemorrhage, hydrocephalus, extra-axial collection or mass lesion. 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 Small chronic infarcts in the right cerebellar hemisphere. There are several foci of susceptibility hypointensity within the bilateral cerebral hemispheres and the cerebellum compatible with hemosiderin deposition from prior microhemorrhage, grossly stable from prior MRI given differences in technique and motion artifact. There is hemosiderin staining of the right superior cerebellar chronic infarction. Scattered background of nonspecific T2 FLAIR hyperintensities in white matter compatible with mild chronic microvascular ischemic changes and there is mild volume loss of the brain. Vascular: As below. Skull and upper cervical spine: Normal  marrow signal. Sinuses/Orbits: Diffuse paranasal sinus mucosal thickening and fluid levels as well as left mastoid opacification, probably due to intubation. No abnormal signal of the right mastoid air cells. Orbits are unremarkable. Other: None. MRA HEAD FINDINGS Internal carotid arteries: Patent. Carotid siphon irregularity compatible with atherosclerosis. No significant stenosis. Anterior cerebral arteries:  Patent. Middle cerebral arteries: Patent. Anterior communicating artery: Patent. Posterior communicating arteries: Fetal left PCA. No right posterior communicating artery identified, likely hypoplastic or absent. Posterior cerebral arteries: Patent. Small caliber right vertebral artery largely terminates in the right PICA with minimal contribution to the basilar. Basilar artery:  Patent. Vertebral arteries:  Patent. No evidence of high-grade stenosis, large vessel occlusion, or aneurysm unless noted above. MRA NECK FINDINGS Aortic arch: Not included within the field of view. Right common carotid artery: Patent. Right internal carotid artery: Patent. Suspected severe proximal right ICA stenosis. Right vertebral artery: Patent. Left common carotid artery: Patent. Left Internal carotid artery: Patent.Mild irregularity of left proximal ICA compatible with atherosclerosis without significant stenosis. Hairpin  turn of mid left cervical ICA. Left Vertebral artery: Patent.  Left dominant. Motion degraded study. IMPRESSION: MRI head: 1. No acute intracranial abnormality identified. No findings of hypoxic ischemic injury to the brain at this time. 2. Small chronic infarctions are present in the left frontal lobe and right cerebellar hemisphere. 3. Stable background of mild chronic microvascular ischemic changes and volume loss of the brain. 4. Stable scattered foci of chronic microhemorrhage probably related to chronic hypertension. MRA head: 1. Patent anterior and posterior intracranial circulation. No large vessel  occlusion, high-grade stenosis, or aneurysm identified. 2. Atherosclerosis of carotid siphons without significant stenosis. MRA neck: 1. Motion degraded study. 2. Patent carotid and vertebral arteries. 3. Suspected severe proximal right ICA stenosis. Carotid ultrasound recommended to further assess. Electronically Signed   By: Kristine Garbe M.D.   On: 03/27/2018 01:49   Dg Chest Port 1 View  Result Date: 04/01/2018 CLINICAL DATA:  Dyspnea EXAM: PORTABLE CHEST 1 VIEW COMPARISON:  03/30/2018 FINDINGS: Cardiac shadow remains enlarged. Postsurgical changes are again seen. Bilateral pleural effusions are noted increasing on the right when compared with the prior exam. Persistent perihilar opacities are noted. No new focal abnormality is seen. IMPRESSION: Increasing right-sided pleural effusion. The remainder of the exam is stable from the prior study. Electronically Signed   By: Inez Catalina M.D.   On: 04/01/2018 09:53   Dg Chest Port 1 View  Result Date: 03/30/2018 CLINICAL DATA:  Post code and CPR. EXAM: PORTABLE CHEST 1 VIEW COMPARISON:  03/29/2018 FINDINGS: Prior CABG and aortic valve replacement. Cardiomegaly. Bilateral perihilar and lower lobe opacities with small to moderate layering effusions. No significant change since prior study. IMPRESSION: No interval change. Electronically Signed   By: Rolm Baptise M.D.   On: 03/30/2018 10:56   Dg Chest Port 1 View  Result Date: 03/29/2018 CLINICAL DATA:  Respiratory failure EXAM: PORTABLE CHEST 1 VIEW COMPARISON:  03/28/2018 FINDINGS: Interval removal of endotracheal tube and NG tube. Cardiomegaly. Diffuse bilateral airspace opacities have worsened since prior study, likely worsening edema. Small to moderate layering bilateral effusions, also increased since prior study. IMPRESSION: Interval worsening it in diffuse bilateral airspace disease, likely worsening edema/CHF. Enlarging small to moderate bilateral effusions. Electronically Signed   By:  Rolm Baptise M.D.   On: 03/29/2018 08:04   Dg Chest Port 1 View  Result Date: 03/28/2018 CLINICAL DATA:  Cardiac arrest EXAM: PORTABLE CHEST 1 VIEW COMPARISON:  03/27/2018 FINDINGS: Endotracheal and NG tubes are stable. Heart remains enlarged. Upper mediastinum remains prominent. Postoperative changes from CABG. Vascular congestion is stable. Stable haziness at both lung bases likely represents a combination of volume loss and pleural fluid. No pneumothorax. IMPRESSION: Stable vascular congestion and bibasilar haziness as described. Electronically Signed   By: Marybelle Killings M.D.   On: 03/28/2018 08:02   Dg Chest Port 1 View  Result Date: 03/27/2018 CLINICAL DATA:  Check endotracheal tube placement EXAM: PORTABLE CHEST 1 VIEW COMPARISON:  04/15/2018 FINDINGS: Cardiac shadow remains enlarged. Postsurgical changes are again identified. Endotracheal tube and nasogastric catheter are noted in satisfactory position. The lungs are well aerated with some patchy right basilar opacities new from the prior exam. Small bilateral pleural effusion are noted as well. No pneumothorax is seen. IMPRESSION: Increase in right basilar opacities with small bilateral pleural effusions. Electronically Signed   By: Inez Catalina M.D.   On: 03/27/2018 08:37   Dg Chest Portable 1 View  Result Date: 04/17/2018 CLINICAL DATA:  Intubated EXAM: PORTABLE CHEST 1 VIEW  COMPARISON:  02/19/2017, 02/21/2017 FINDINGS: Endotracheal tube tip is about 3.8 cm superior to the carina. Esophageal tube tip is below the diaphragm but non included. Post sternotomy changes. Cardiomegaly with mild central congestion. Similar appearance of left pleural blunting. Aortic atherosclerosis. No pneumothorax. IMPRESSION: 1. Endotracheal tube tip about 3.8 cm superior to the carina. Esophageal tube tip below the diaphragm but non included. 2. Cardiomegaly with vascular congestion Electronically Signed   By: Donavan Foil M.D.   On: 04/17/2018 17:15     LOS: 8  days   Oren Binet, MD  [facility]  If 7PM-7AM, please contact night-coverage  Please page via [email or web address]-click on MD name and type text message  04/03/2018, 2:29 PM

## 2018-04-03 NOTE — Progress Notes (Signed)
Subjective: No issues overnight. Nasal bleeding controlled with Merocel packing.  Objective: Vital signs in last 24 hours: Temp:  [97.7 F (36.5 C)-98.9 F (37.2 C)] 97.8 F (36.6 C) (10/10 0015) Pulse Rate:  [74-86] 86 (10/09 1434) Resp:  [20-30] 20 (10/09 1434) BP: (125-128)/(64-77) 128/77 (10/09 1434) SpO2:  [98 %-100 %] 100 % (10/09 2116) FiO2 (%):  [35 %-40 %] 35 % (10/09 2116)  Physical Exam: General appearance :Awake, not in any distress. Eyes: His pupils are equal, round, reactive to light. Extraocular motion is intact.  Ears: Examination of the ears shows normal auricles and external auditory canals bilaterally.  Nose: Right nasal packing in place. Dry. No bleeding on the left. Face: Facial examination shows no asymmetry. Palpation of the face elicit no significant tenderness.  Mouth: Normal mucosa. Neck:  Palpation of the neck reveals no lymphadenopathy or mass. The trachea is midline. The thyroid is not significantly enlarged.  Skin:No Rash, warm and dry.  Recent Labs    04/02/18 0520 04/03/18 0403  WBC 6.9 8.1  HGB 8.5* 8.9*  HCT 28.1* 28.9*  PLT 163 175   Recent Labs    04/02/18 0520 04/03/18 0403  NA 137 138  K 3.7 3.6  CL 93* 92*  CO2 35* 39*  GLUCOSE 118* 172*  BUN 18 19  CREATININE 0.96 0.90  CALCIUM 8.4* 8.8*    Medications:  I have reviewed the patient's current medications. Scheduled: . carvedilol  3.125 mg Oral BID WC  . chlorhexidine  15 mL Mouth Rinse BID  . feeding supplement (PRO-STAT SUGAR FREE 64)  30 mL Oral BID  . furosemide  60 mg Intravenous Q8H  . insulin aspart  0-15 Units Subcutaneous TID WC  . ipratropium-albuterol  3 mL Nebulization BID  . mouth rinse  15 mL Mouth Rinse q12n4p  . oxymetazoline  1 spray Each Nare BID  . pantoprazole  40 mg Oral Daily   Continuous: . doxycycline (VIBRAMYCIN) IV Stopped (04/03/18 0133)    Assessment/Plan: Right epistaxis under control after right nasal packing. - Will leave packing  in place until Monday next week. - Abx while packing is in place.   LOS: 8 days   Johne Buckle W Pallas Wahlert 04/03/2018, 6:35 AM

## 2018-04-04 DIAGNOSIS — I4819 Other persistent atrial fibrillation: Secondary | ICD-10-CM

## 2018-04-04 DIAGNOSIS — Z7189 Other specified counseling: Secondary | ICD-10-CM

## 2018-04-04 DIAGNOSIS — Z515 Encounter for palliative care: Secondary | ICD-10-CM

## 2018-04-04 LAB — GLUCOSE, CAPILLARY
GLUCOSE-CAPILLARY: 145 mg/dL — AB (ref 70–99)
GLUCOSE-CAPILLARY: 123 mg/dL — AB (ref 70–99)
Glucose-Capillary: 138 mg/dL — ABNORMAL HIGH (ref 70–99)
Glucose-Capillary: 145 mg/dL — ABNORMAL HIGH (ref 70–99)

## 2018-04-04 NOTE — Progress Notes (Signed)
I received a [place]ater[place] to provide spiritual support for the patient. I visited with the patient with his wife present at bedside. I offered words of encouragement and led in prayer. I shared that the Chaplain is available for additional support as needed or requested.    04/04/18 1400  Clinical Encounter Type  Visited With Patient and family together  Visit Type Spiritual support  Referral From Nurse  Consult/Referral To Chaplain  Spiritual Encounters  Spiritual Needs Prayer  Stress Factors  Patient Stress Factors Exhausted  Family Stress Factors Exhausted    Chaplain Dr Redgie Grayer

## 2018-04-04 NOTE — Progress Notes (Signed)
   04/04/18 1100  Clinical Encounter Type  Visited With Health care provider  Visit Type Other (Comment) (phone call)   Called RN to see if needed to come up tonight  RN said to come tomorrow once son arrives.  Gave info via secure email and in MR to day chaplains.  Myra Gianotti resident, [phone]

## 2018-04-04 NOTE — Progress Notes (Signed)
PROGRESS NOTE        PATIENT DETAILS Name: Terry Wu Age: 84 y.o. Sex: male Date of Birth: 06-11-1932 Admit Date: 04/12/2018 Admitting Physician Kandice Hams, MD [id number], Percell Miller, MD  Brief Narrative: Patient is a 84 y.o. male past medical history of CAD status post CABG/aortic valve replacement in 2006, history of colon cancer status post resection/colostomy in 0388 complicated by postoperative stroke, A. fib on Eliquis-admitted to the hospital on 10/2 following a cardiac arrest with return of spontaneous circulation in approximately 5 minutes.  No shocks were administered.  Patient was intubated in the emergency room for airway protection, subsequently managed in the intensive care unit stabilized and transferred to the hospitalist service.  Subsequent hospital course has been complicated by hypercarbic respiratory failure, decompensated diastolic heart failure, and now likely epistaxis and hematuria.  See below for further details  Subjective: More lethargic compared to yesterday-but does open eyes-and briefly squeezes my hands.  Hematuria has resolved.  Epistaxis resolved after nasal packing.  Assessment/Plan: Cardiac arrest: Reviewed H&P/ED notes, had approximately 5 minutes of downtime-with ROSC in 5 minutes.  Shocks were never administered.  Suspect this may have been a respiratory event causing cardiac arrest.  Echocardiogram with preserved EF.  Telemetry with A. fib.  He was initially managed in the [place] stability-he was transferred to the hospitalist service.    Acute on chronic diastolic heart failure: Continues to be volume overloaded-although better-continue IV Lasix, follow electrolytes, weights, intake/output.    Acute hypercarbic respiratory failure: Worsened on 10/8 with encephalopathy requiring prolonged BiPAP.  He subsequently improved-and became much more awake and alert-however this morning he appears to be slightly more  lethargic than usual.  Has not used positive pressure ventilation due to epistaxis.  Family has met with palliative care-awaiting arrival of family members from out of town-following which patient may be transition to comfort measures.  Will await further recommendations from palliative care team.    MSSA pneumonia: Sputum cultures positive for MSSA-remains on doxycycline.    Right-sided pleural effusion: Suspect secondary to heart failure-continue IV Lasix-if it becomes hypoxic or worsens-we can consider thoracocentesis-if patient is transition to comfort measures-would not pursue any further work-up.  Epistaxis: Occurred on 10/9-Eliquis stopped-since kept on recurring-ENT consulted and nasal packing placed.  Continue to hold Eliquis for now.   Hematuria: Secondary to Foley trauma-this occurred on 10/9-Eliquis on hold-with periodic Foley flushes-hematuria has resolved.    PAF: A. fib on telemetry, continue Coreg-due to persistent epistaxis and hematuria-anticoagulation on hold.  Difficult situation-family aware of the risk of potential of CVA when off anticoagulation.  However if patient is transition to full comfort care-no role to resume anticoagulation  Hyponatremia: Secondary to hypervolemia-resolved with diuretics  Hypokalemia: Resolved with repletion-follow periodically.  Normocytic anemia: Probably secondary to acute illness-no indication for transfusion as of now-follow.  DM-2: CBG stable-continue SSI  Hypertension: Controlled continue Coreg and Lasix.  COPD: Lungs without rhonchi-has some bibasilar rales-continue bronchodilators-no role for steroids at this time.  GERD: Continue PPI  Dysphagia: Remains on a dysphagia 3 diet-speech therapy planning a modified barium swallow at some point.  Palliative care: DNR in place-Hospital course complicated by numerous medical issues-intermittent encephalopathy continues, failure to thrive syndrome continues as well-he is incredibly weak  and debilitated.  Given above-noted issues and underlying frailty-long-term prognosis is poor-agree with palliative care that he may  be best served by comfort measures.  Will await further recommendations from palliative care team.  DVT Prophylaxis: Eliquis held on 10/9  Code Status: DNR  Family Communication: Spouse at bedside  Disposition Plan: Remain inpatient-requires several more days of hospital stay before discharge.  Antimicrobial agents: Anti-infectives (From admission, onward)   Start     Dose/Rate Route Frequency Ordered Stop   04/03/18 1000  doxycycline (VIBRA-TABS) tablet 100 mg  Status:  Discontinued     100 mg Oral Every 12 hours 04/03/18 0915 04/03/18 1356   04/01/18 1200  doxycycline (VIBRAMYCIN) 100 mg in sodium chloride 0.9 % 250 mL IVPB  Status:  Discontinued     100 mg 125 mL/hr over 120 Minutes Intravenous Every 12 hours 04/01/18 1122 04/03/18 0914   03/30/18 1030  doxycycline (VIBRA-TABS) tablet 100 mg  Status:  Discontinued     100 mg Oral Every 12 hours 03/30/18 1014 04/01/18 1122   03/29/18 1400  vancomycin (VANCOCIN) 1,250 mg in sodium chloride 0.9 % 250 mL IVPB  Status:  Discontinued     1,250 mg 166.7 mL/hr over 90 Minutes Intravenous Every 24 hours 03/28/18 1131 03/30/18 1014   03/28/18 1230  vancomycin (VANCOCIN) 2,000 mg in sodium chloride 0.9 % 500 mL IVPB     2,000 mg 250 mL/hr over 120 Minutes Intravenous  Once 03/28/18 1131 03/28/18 1557      Procedures: 10/2>> ETT  CONSULTS: PCCM ENT Palliative care  Time spent: 25 min-Greater than 50% of this time was spent in counseling, explanation of diagnosis, planning of further management, and coordination of care.  MEDICATIONS: Scheduled Meds: . carvedilol  3.125 mg Oral BID WC  . chlorhexidine  15 mL Mouth Rinse BID  . feeding supplement (PRO-STAT SUGAR FREE 64)  30 mL Oral BID  . furosemide  60 mg Intravenous Q8H  . insulin aspart  0-15 Units Subcutaneous TID WC  . mouth rinse  15 mL  Mouth Rinse q12n4p  . pantoprazole  40 mg Oral Daily   Continuous Infusions:  PRN Meds:.bisacodyl, docusate, glycopyrrolate, haloperidol **OR** haloperidol **OR** haloperidol lactate, ipratropium-albuterol, LORazepam **OR** LORazepam **OR** LORazepam, morphine injection   PHYSICAL EXAM: Vital signs: Vitals:   04/04/18 0345 04/04/18 0800 04/04/18 1015 04/04/18 1200  BP: 115/67 112/60  130/71  Pulse: 83 84  91  Resp: (!) 33 (!) 22  (!) 29  Temp: 97.8 F (36.6 C)  98.2 F (36.8 C)   TempSrc: Axillary     SpO2:  99%  100%  Weight:      Height:       Filed Weights   03/29/18 0546 03/30/18 0600 03/31/18 0500  Weight: 111.1 kg 110.7 kg 112.3 kg   Body mass index is 36.56 kg/m.   General appearance:Awake but lethargic.   Eyes:no scleral icterus. HEENT: Atraumatic and Normocephalic Neck: supple, no JVD. Resp:Good air entry bilaterally,some scattered rhonchi CVS: S1 S2 regular, no murmurs.  GI: Bowel sounds present, Non tender and not distended with no gaurding, rigidity or rebound. Extremities: B/L Lower ++ edema, both legs are warm to touch Musculoskeletal:No digital cyanosis Skin:No Rash, warm and dry Wounds:N/A  I have personally reviewed following labs and imaging studies  LABORATORY DATA: CBC: Recent Labs  Lab 03/29/18 0240 03/30/18 0230 03/31/18 0420 04/02/18 0520 04/03/18 0403  WBC 12.3* 11.5* 9.9 6.9 8.1  HGB 9.3* 9.2* 9.3* 8.5* 8.9*  HCT 29.9* 29.9* 29.7* 28.1* 28.9*  MCV 92.9 93.4 94.0 93.4 93.8  PLT 166 174 168 163 175  Basic Metabolic Panel: Recent Labs  Lab 03/29/18 0240 03/30/18 0230 03/31/18 0420 04/01/18 0237 04/02/18 0520 04/03/18 0403  NA 132* 134* 134* 135 137 138  K 3.4* 4.0 4.2 4.2 3.7 3.6  CL 94* 95* 95* 92* 93* 92*  CO2 '30 31 31 '$ 33* 35* 39*  GLUCOSE 152* 127* 152* 144* 118* 172*  BUN '11 11 11 14 18 19  '$ CREATININE 0.86 0.81 0.85 0.92 0.96 0.90  CALCIUM 7.8* 8.2* 8.2* 8.6* 8.4* 8.8*  MG 2.1  --   --   --   --   --   PHOS 3.5   --   --   --   --   --     GFR: Estimated Creatinine Clearance: 75.4 mL/min (by C-G formula based on SCr of 0.9 mg/dL).  Liver Function Tests: Recent Labs  Lab 03/29/18 0240  AST 22  ALT 33  ALKPHOS 32*  BILITOT 0.9  PROT 5.8*  ALBUMIN 2.4*   No results for input(s): LIPASE, AMYLASE in the last 168 hours. No results for input(s): AMMONIA in the last 168 hours.  Coagulation Profile: No results for input(s): INR, PROTIME in the last 168 hours.  Cardiac Enzymes: No results for input(s): CKTOTAL, CKMB, CKMBINDEX, TROPONINI in the last 168 hours.  BNP (last 3 results) No results for input(s): PROBNP in the last 8760 hours.  HbA1C: No results for input(s): HGBA1C in the last 72 hours.  CBG: Recent Labs  Lab 04/03/18 1158 04/03/18 1724 04/03/18 2213 04/04/18 0808 04/04/18 1215  GLUCAP 185* 182* 188* 138* 145*    Lipid Profile: No results for input(s): CHOL, HDL, LDLCALC, TRIG, CHOLHDL, LDLDIRECT in the last 72 hours.  Thyroid Function Tests: No results for input(s): TSH, T4TOTAL, FREET4, T3FREE, THYROIDAB in the last 72 hours.  Anemia Panel: No results for input(s): VITAMINB12, FOLATE, FERRITIN, TIBC, IRON, RETICCTPCT in the last 72 hours.  Urine analysis:    Component Value Date/Time   COLORURINE YELLOW 04/06/2018 1710   APPEARANCEUR HAZY (A) 03/28/2018 1710   LABSPEC 1.013 04/16/2018 1710   PHURINE 5.0 04/24/2018 1710   GLUCOSEU NEGATIVE 04/20/2018 1710   HGBUR LARGE (A) 04/09/2018 1710   BILIRUBINUR NEGATIVE 04/13/2018 1710   KETONESUR NEGATIVE 04/06/2018 1710   PROTEINUR 100 (A) 04/08/2018 1710   NITRITE NEGATIVE 03/29/2018 1710   LEUKOCYTESUR NEGATIVE 04/24/2018 1710    Sepsis Labs: Lactic Acid, Venous    Component Value Date/Time   LATICACIDVEN 1.81 04/01/2018 1809    MICROBIOLOGY: Recent Results (from the past 240 hour(s))  Culture, blood (routine x 2)     Status: None   Collection Time: 03/25/2018  6:16 PM  Result Value Ref Range Status    Specimen Description BLOOD RIGHT ARM  Final   Special Requests   Final    BOTTLES DRAWN AEROBIC AND ANAEROBIC Blood Culture adequate volume   Culture   Final    NO GROWTH 5 DAYS Performed at [facility], [place] [address], [place], [place] [address]    Report Status 03/31/2018 FINAL  Final  MRSA PCR Screening     Status: None   Collection Time: 03/25/2018  9:50 PM  Result Value Ref Range Status   MRSA by PCR NEGATIVE NEGATIVE Final    Comment:        The GeneXpert MRSA Assay (FDA approved for NASAL specimens only), is one component of a comprehensive MRSA colonization surveillance program. It is not intended to diagnose MRSA infection nor to guide or monitor  treatment for MRSA infections. Performed at [facility], [place] [address] [place], [place] [address]   Culture, blood (routine x 2)     Status: None   Collection Time: 03/30/2018 10:15 PM  Result Value Ref Range Status   Specimen Description BLOOD RIGHT HAND  Final   Special Requests   Final    BOTTLES DRAWN AEROBIC ONLY Blood Culture adequate volume   Culture   Final    NO GROWTH 5 DAYS Performed at [facility], [place] [address] [place], [place] [address]    Report Status 03/31/2018 FINAL  Final  Culture, respiratory (non-expectorated)     Status: None   Collection Time: 03/27/18 11:29 AM  Result Value Ref Range Status   Specimen Description TRACHEAL ASPIRATE  Final   Special Requests NONE  Final   Gram Stain   Final    NO SQUAMOUS EPITHELIAL CELLS PRESENT ABUNDANT WBC PRESENT, PREDOMINANTLY PMN RARE GRAM POSITIVE COCCI Performed at [facility], [place] [address], [place],  [address]    Culture FEW STAPHYLOCOCCUS AUREUS  Final   Report Status 03/29/2018 FINAL  Final   Organism ID, Bacteria STAPHYLOCOCCUS AUREUS  Final      Susceptibility   Staphylococcus aureus - MIC*    CIPROFLOXACIN <=0.5 SENSITIVE Sensitive     ERYTHROMYCIN >=8 RESISTANT Resistant     GENTAMICIN <=0.5 SENSITIVE  Sensitive     OXACILLIN 0.5 SENSITIVE Sensitive     TETRACYCLINE <=1 SENSITIVE Sensitive     VANCOMYCIN 1 SENSITIVE Sensitive     TRIMETH/SULFA <=10 SENSITIVE Sensitive     CLINDAMYCIN <=0.25 SENSITIVE Sensitive     RIFAMPIN <=0.5 SENSITIVE Sensitive     Inducible Clindamycin NEGATIVE Sensitive     * FEW STAPHYLOCOCCUS AUREUS    RADIOLOGY STUDIES/RESULTS: Ct Head Wo Contrast  Result Date: 03/31/2018 CLINICAL DATA:  C-spine trauma.  Cardiac arrest.  CPR for 5 minutes. EXAM: CT HEAD WITHOUT CONTRAST CT CERVICAL SPINE WITHOUT CONTRAST TECHNIQUE: Multidetector CT imaging of the head and cervical spine was performed following the standard protocol without intravenous contrast. Multiplanar CT image reconstructions of the cervical spine were also generated. COMPARISON:  CT scan February 21, 2017 FINDINGS: CT HEAD FINDINGS Brain: No subdural, epidural, or subarachnoid hemorrhage. Cerebellum demonstrates no acute abnormalities. A tiny lacunar infarct is seen in the right cerebellar hemisphere on axial image 8. Basal cisterns are unchanged unremarkable. Ventricles and sulci are stable. No acute cortical ischemia or infarct. No mass effect or midline shift. Vascular: Calcified atherosclerosis in the intracranial carotids. Skull: Normal. Negative for fracture or focal lesion. Sinuses/Orbits: There is opacification of scattered left mastoid air cells, improved since February 21, 2017. The right mastoid air cells, middle ears are normal. Scattered opacification of ethmoid air cells with fluid in the sphenoid sinuses and a mucous retention cyst in the left maxillary sinus. Other: None. CT CERVICAL SPINE FINDINGS Alignment: Normal. Skull base and vertebrae: No acute fracture. No primary bone lesion or focal pathologic process. Soft tissues and spinal canal: No prevertebral fluid or swelling. No visible canal hematoma. Disc levels:  Multilevel degenerative changes. Upper chest: Mild opacity in the posterior aspect of the  right upper lobe. Other: No other abnormalities. IMPRESSION: 1. No acute intracranial abnormalities. 2. No fracture or traumatic malalignment cervical spine. Multilevel degenerative changes. 3. Sinus disease as above. Electronically Signed   By: Dorise Bullion III M.D   On: 04/01/2018 19:02   Ct Cervical Spine Wo Contrast  Result  Date: 04/17/2018 CLINICAL DATA:  C-spine trauma.  Cardiac arrest.  CPR for 5 minutes. EXAM: CT HEAD WITHOUT CONTRAST CT CERVICAL SPINE WITHOUT CONTRAST TECHNIQUE: Multidetector CT imaging of the head and cervical spine was performed following the standard protocol without intravenous contrast. Multiplanar CT image reconstructions of the cervical spine were also generated. COMPARISON:  CT scan February 21, 2017 FINDINGS: CT HEAD FINDINGS Brain: No subdural, epidural, or subarachnoid hemorrhage. Cerebellum demonstrates no acute abnormalities. A tiny lacunar infarct is seen in the right cerebellar hemisphere on axial image 8. Basal cisterns are unchanged unremarkable. Ventricles and sulci are stable. No acute cortical ischemia or infarct. No mass effect or midline shift. Vascular: Calcified atherosclerosis in the intracranial carotids. Skull: Normal. Negative for fracture or focal lesion. Sinuses/Orbits: There is opacification of scattered left mastoid air cells, improved since February 21, 2017. The right mastoid air cells, middle ears are normal. Scattered opacification of ethmoid air cells with fluid in the sphenoid sinuses and a mucous retention cyst in the left maxillary sinus. Other: None. CT CERVICAL SPINE FINDINGS Alignment: Normal. Skull base and vertebrae: No acute fracture. No primary bone lesion or focal pathologic process. Soft tissues and spinal canal: No prevertebral fluid or swelling. No visible canal hematoma. Disc levels:  Multilevel degenerative changes. Upper chest: Mild opacity in the posterior aspect of the right upper lobe. Other: No other abnormalities. IMPRESSION: 1.  No acute intracranial abnormalities. 2. No fracture or traumatic malalignment cervical spine. Multilevel degenerative changes. 3. Sinus disease as above. Electronically Signed   By: Dorise Bullion III M.D   On: 04/06/2018 19:02   [name] Head Wo Contrast  Result Date: 03/27/2018 CLINICAL DATA:  84 y/o M; cardiac arrest, CPR, ROSC in 5 minutes, total down time 10 minutes. EXAM: MRI HEAD WITHOUT CONTRAST MRA HEAD WITHOUT CONTRAST MRA NECK WITHOUT CONTRAST TECHNIQUE: Multiplanar, multiecho pulse sequences of the brain and surrounding structures were obtained without intravenous contrast. Angiographic images of the Circle of Willis were obtained using MRA technique without intravenous contrast. Angiographic images of the neck were obtained using MRA technique without intravenous contrast. Carotid stenosis measurements (when applicable) are obtained utilizing NASCET criteria, using the distal internal carotid diameter as the denominator. COMPARISON:  02/10/2017 MRA head and neck. 02/09/2017 MRI head. 04/08/2018 CT head and cervical spine. FINDINGS: MRI HEAD FINDINGS Brain: No acute infarction, hemorrhage, hydrocephalus, extra-axial collection or mass lesion. 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 Small chronic infarcts in the right cerebellar hemisphere. There are several foci of susceptibility hypointensity within the bilateral cerebral hemispheres and the cerebellum compatible with hemosiderin deposition from prior microhemorrhage, grossly stable from prior MRI given differences in technique and motion artifact. There is hemosiderin staining of the right superior cerebellar chronic infarction. Scattered background of nonspecific T2 FLAIR hyperintensities in white matter compatible with mild chronic microvascular ischemic changes and there is mild volume loss of the brain. Vascular: As below. Skull and upper cervical spine: Normal marrow signal. Sinuses/Orbits: Diffuse  paranasal sinus mucosal thickening and fluid levels as well as left mastoid opacification, probably due to intubation. No abnormal signal of the right mastoid air cells. Orbits are unremarkable. Other: None. MRA HEAD FINDINGS Internal carotid arteries: Patent. Carotid siphon irregularity compatible with atherosclerosis. No significant stenosis. Anterior cerebral arteries:  Patent. Middle cerebral arteries: Patent. Anterior communicating artery: Patent. Posterior communicating arteries: Fetal left PCA. No right posterior communicating artery identified, likely hypoplastic or absent. Posterior cerebral arteries: Patent. Small caliber right vertebral  artery largely terminates in the right PICA with minimal contribution to the basilar. Basilar artery:  Patent. Vertebral arteries:  Patent. No evidence of high-grade stenosis, large vessel occlusion, or aneurysm unless noted above. MRA NECK FINDINGS Aortic arch: Not included within the field of view. Right common carotid artery: Patent. Right internal carotid artery: Patent. Suspected severe proximal right ICA stenosis. Right vertebral artery: Patent. Left common carotid artery: Patent. Left Internal carotid artery: Patent.Mild irregularity of left proximal ICA compatible with atherosclerosis without significant stenosis. Hairpin turn of mid left cervical ICA. Left Vertebral artery: Patent.  Left dominant. Motion degraded study. IMPRESSION: MRI head: 1. No acute intracranial abnormality identified. No findings of hypoxic ischemic injury to the brain at this time. 2. Small chronic infarctions are present in the left frontal lobe and right cerebellar hemisphere. 3. Stable background of mild chronic microvascular ischemic changes and volume loss of the brain. 4. Stable scattered foci of chronic microhemorrhage probably related to chronic hypertension. MRA head: 1. Patent anterior and posterior intracranial circulation. No large vessel occlusion, high-grade stenosis, or  aneurysm identified. 2. Atherosclerosis of carotid siphons without significant stenosis. MRA neck: 1. Motion degraded study. 2. Patent carotid and vertebral arteries. 3. Suspected severe proximal right ICA stenosis. Carotid ultrasound recommended to further assess. Electronically Signed   By: Kristine Garbe M.D.   On: 03/27/2018 01:49   [name] Neck Wo Contrast  Result Date: 03/27/2018 CLINICAL DATA:  84 y/o M; cardiac arrest, CPR, ROSC in 5 minutes, total down time 10 minutes. EXAM: MRI HEAD WITHOUT CONTRAST MRA HEAD WITHOUT CONTRAST MRA NECK WITHOUT CONTRAST TECHNIQUE: Multiplanar, multiecho pulse sequences of the brain and surrounding structures were obtained without intravenous contrast. Angiographic images of the Circle of Willis were obtained using MRA technique without intravenous contrast. Angiographic images of the neck were obtained using MRA technique without intravenous contrast. Carotid stenosis measurements (when applicable) are obtained utilizing NASCET criteria, using the distal internal carotid diameter as the denominator. COMPARISON:  02/10/2017 MRA head and neck. 02/09/2017 MRI head. 04/21/2018 CT head and cervical spine. FINDINGS: MRI HEAD FINDINGS Brain: No acute infarction, hemorrhage, hydrocephalus, extra-axial collection or mass lesion. 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 Small chronic infarcts in the right cerebellar hemisphere. There are several foci of susceptibility hypointensity within the bilateral cerebral hemispheres and the cerebellum compatible with hemosiderin deposition from prior microhemorrhage, grossly stable from prior MRI given differences in technique and motion artifact. There is hemosiderin staining of the right superior cerebellar chronic infarction. Scattered background of nonspecific T2 FLAIR hyperintensities in white matter compatible with mild chronic microvascular ischemic changes and there is mild volume loss of  the brain. Vascular: As below. Skull and upper cervical spine: Normal marrow signal. Sinuses/Orbits: Diffuse paranasal sinus mucosal thickening and fluid levels as well as left mastoid opacification, probably due to intubation. No abnormal signal of the right mastoid air cells. Orbits are unremarkable. Other: None. MRA HEAD FINDINGS Internal carotid arteries: Patent. Carotid siphon irregularity compatible with atherosclerosis. No significant stenosis. Anterior cerebral arteries:  Patent. Middle cerebral arteries: Patent. Anterior communicating artery: Patent. Posterior communicating arteries: Fetal left PCA. No right posterior communicating artery identified, likely hypoplastic or absent. Posterior cerebral arteries: Patent. Small caliber right vertebral artery largely terminates in the right PICA with minimal contribution to the basilar. Basilar artery:  Patent. Vertebral arteries:  Patent. No evidence of high-grade stenosis, large vessel occlusion, or aneurysm unless noted above. MRA NECK FINDINGS Aortic arch: Not included  within the field of view. Right common carotid artery: Patent. Right internal carotid artery: Patent. Suspected severe proximal right ICA stenosis. Right vertebral artery: Patent. Left common carotid artery: Patent. Left Internal carotid artery: Patent.Mild irregularity of left proximal ICA compatible with atherosclerosis without significant stenosis. Hairpin turn of mid left cervical ICA. Left Vertebral artery: Patent.  Left dominant. Motion degraded study. IMPRESSION: MRI head: 1. No acute intracranial abnormality identified. No findings of hypoxic ischemic injury to the brain at this time. 2. Small chronic infarctions are present in the left frontal lobe and right cerebellar hemisphere. 3. Stable background of mild chronic microvascular ischemic changes and volume loss of the brain. 4. Stable scattered foci of chronic microhemorrhage probably related to chronic hypertension. MRA head: 1.  Patent anterior and posterior intracranial circulation. No large vessel occlusion, high-grade stenosis, or aneurysm identified. 2. Atherosclerosis of carotid siphons without significant stenosis. MRA neck: 1. Motion degraded study. 2. Patent carotid and vertebral arteries. 3. Suspected severe proximal right ICA stenosis. Carotid ultrasound recommended to further assess. Electronically Signed   By: Kristine Garbe M.D.   On: 03/27/2018 01:49   Mr Brain Wo Contrast  Result Date: 03/27/2018 CLINICAL DATA:  84 y/o M; cardiac arrest, CPR, ROSC in 5 minutes, total down time 10 minutes. EXAM: MRI HEAD WITHOUT CONTRAST MRA HEAD WITHOUT CONTRAST MRA NECK WITHOUT CONTRAST TECHNIQUE: Multiplanar, multiecho pulse sequences of the brain and surrounding structures were obtained without intravenous contrast. Angiographic images of the Circle of Willis were obtained using MRA technique without intravenous contrast. Angiographic images of the neck were obtained using MRA technique without intravenous contrast. Carotid stenosis measurements (when applicable) are obtained utilizing NASCET criteria, using the distal internal carotid diameter as the denominator. COMPARISON:  02/10/2017 MRA head and neck. 02/09/2017 MRI head. 04/12/2018 CT head and cervical spine. FINDINGS: MRI HEAD FINDINGS Brain: No acute infarction, hemorrhage, hydrocephalus, extra-axial collection or mass lesion. 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 Small chronic infarcts in the right cerebellar hemisphere. There are several foci of susceptibility hypointensity within the bilateral cerebral hemispheres and the cerebellum compatible with hemosiderin deposition from prior microhemorrhage, grossly stable from prior MRI given differences in technique and motion artifact. There is hemosiderin staining of the right superior cerebellar chronic infarction. Scattered background of nonspecific T2 FLAIR hyperintensities in  white matter compatible with mild chronic microvascular ischemic changes and there is mild volume loss of the brain. Vascular: As below. Skull and upper cervical spine: Normal marrow signal. Sinuses/Orbits: Diffuse paranasal sinus mucosal thickening and fluid levels as well as left mastoid opacification, probably due to intubation. No abnormal signal of the right mastoid air cells. Orbits are unremarkable. Other: None. MRA HEAD FINDINGS Internal carotid arteries: Patent. Carotid siphon irregularity compatible with atherosclerosis. No significant stenosis. Anterior cerebral arteries:  Patent. Middle cerebral arteries: Patent. Anterior communicating artery: Patent. Posterior communicating arteries: Fetal left PCA. No right posterior communicating artery identified, likely hypoplastic or absent. Posterior cerebral arteries: Patent. Small caliber right vertebral artery largely terminates in the right PICA with minimal contribution to the basilar. Basilar artery:  Patent. Vertebral arteries:  Patent. No evidence of high-grade stenosis, large vessel occlusion, or aneurysm unless noted above. MRA NECK FINDINGS Aortic arch: Not included within the field of view. Right common carotid artery: Patent. Right internal carotid artery: Patent. Suspected severe proximal right ICA stenosis. Right vertebral artery: Patent. Left common carotid artery: Patent. Left Internal carotid artery: Patent.Mild irregularity of left proximal ICA compatible with  atherosclerosis without significant stenosis. Hairpin turn of mid left cervical ICA. Left Vertebral artery: Patent.  Left dominant. Motion degraded study. IMPRESSION: MRI head: 1. No acute intracranial abnormality identified. No findings of hypoxic ischemic injury to the brain at this time. 2. Small chronic infarctions are present in the left frontal lobe and right cerebellar hemisphere. 3. Stable background of mild chronic microvascular ischemic changes and volume loss of the brain. 4.  Stable scattered foci of chronic microhemorrhage probably related to chronic hypertension. MRA head: 1. Patent anterior and posterior intracranial circulation. No large vessel occlusion, high-grade stenosis, or aneurysm identified. 2. Atherosclerosis of carotid siphons without significant stenosis. MRA neck: 1. Motion degraded study. 2. Patent carotid and vertebral arteries. 3. Suspected severe proximal right ICA stenosis. Carotid ultrasound recommended to further assess. Electronically Signed   By: Kristine Garbe M.D.   On: 03/27/2018 01:49   Dg Chest Port 1 View  Result Date: 04/01/2018 CLINICAL DATA:  Dyspnea EXAM: PORTABLE CHEST 1 VIEW COMPARISON:  03/30/2018 FINDINGS: Cardiac shadow remains enlarged. Postsurgical changes are again seen. Bilateral pleural effusions are noted increasing on the right when compared with the prior exam. Persistent perihilar opacities are noted. No new focal abnormality is seen. IMPRESSION: Increasing right-sided pleural effusion. The remainder of the exam is stable from the prior study. Electronically Signed   By: Inez Catalina M.D.   On: 04/01/2018 09:53   Dg Chest Port 1 View  Result Date: 03/30/2018 CLINICAL DATA:  Post code and CPR. EXAM: PORTABLE CHEST 1 VIEW COMPARISON:  03/29/2018 FINDINGS: Prior CABG and aortic valve replacement. Cardiomegaly. Bilateral perihilar and lower lobe opacities with small to moderate layering effusions. No significant change since prior study. IMPRESSION: No interval change. Electronically Signed   By: Rolm Baptise M.D.   On: 03/30/2018 10:56   Dg Chest Port 1 View  Result Date: 03/29/2018 CLINICAL DATA:  Respiratory failure EXAM: PORTABLE CHEST 1 VIEW COMPARISON:  03/28/2018 FINDINGS: Interval removal of endotracheal tube and NG tube. Cardiomegaly. Diffuse bilateral airspace opacities have worsened since prior study, likely worsening edema. Small to moderate layering bilateral effusions, also increased since prior study.  IMPRESSION: Interval worsening it in diffuse bilateral airspace disease, likely worsening edema/CHF. Enlarging small to moderate bilateral effusions. Electronically Signed   By: Rolm Baptise M.D.   On: 03/29/2018 08:04   Dg Chest Port 1 View  Result Date: 03/28/2018 CLINICAL DATA:  Cardiac arrest EXAM: PORTABLE CHEST 1 VIEW COMPARISON:  03/27/2018 FINDINGS: Endotracheal and NG tubes are stable. Heart remains enlarged. Upper mediastinum remains prominent. Postoperative changes from CABG. Vascular congestion is stable. Stable haziness at both lung bases likely represents a combination of volume loss and pleural fluid. No pneumothorax. IMPRESSION: Stable vascular congestion and bibasilar haziness as described. Electronically Signed   By: Marybelle Killings M.D.   On: 03/28/2018 08:02   Dg Chest Port 1 View  Result Date: 03/27/2018 CLINICAL DATA:  Check endotracheal tube placement EXAM: PORTABLE CHEST 1 VIEW COMPARISON:  04/17/2018 FINDINGS: Cardiac shadow remains enlarged. Postsurgical changes are again identified. Endotracheal tube and nasogastric catheter are noted in satisfactory position. The lungs are well aerated with some patchy right basilar opacities new from the prior exam. Small bilateral pleural effusion are noted as well. No pneumothorax is seen. IMPRESSION: Increase in right basilar opacities with small bilateral pleural effusions. Electronically Signed   By: Inez Catalina M.D.   On: 03/27/2018 08:37   Dg Chest Portable 1 View  Result Date: 04/18/2018 CLINICAL DATA:  Intubated  EXAM: PORTABLE CHEST 1 VIEW COMPARISON:  02/19/2017, 02/21/2017 FINDINGS: Endotracheal tube tip is about 3.8 cm superior to the carina. Esophageal tube tip is below the diaphragm but non included. Post sternotomy changes. Cardiomegaly with mild central congestion. Similar appearance of left pleural blunting. Aortic atherosclerosis. No pneumothorax. IMPRESSION: 1. Endotracheal tube tip about 3.8 cm superior to the carina.  Esophageal tube tip below the diaphragm but non included. 2. Cardiomegaly with vascular congestion Electronically Signed   By: Donavan Foil M.D.   On: 04/14/2018 17:15     LOS: 9 days   Oren Binet, MD  [facility]  If 7PM-7AM, please contact night-coverage  Please page via [email or web address]-click on MD name and type text message  04/04/2018, 2:10 PM

## 2018-04-04 NOTE — Progress Notes (Signed)
Daily Progress Note   Patient Name: Terry Wu       Date: 04/04/2018 DOB: 04-27-1932  Age: 84 y.o. MRN#: [id number] Attending Physician: Jonetta Osgood, MD Primary Care Physician: Sinda Du, MD Admit Date: 04/22/2018  Reason for Consultation/Follow-up: Establishing goals of care  Subjective: Patient in bed, lethargic, opens eyes briefly to touch. According to spouse, patient's son is en route from [place], expected to arrive later this evening. Plan per my colleague is after grandchildren spend time, then will transition to full comfort care.  Patient's spouse requests that sedating medications be held until son and Grandchildren arrive. Spoke with spouse out in hall. Gave emotional support. She states her heart is breaking. She asked if she should call her pastor, but states, "I don't want him to come unnecessarily".  Discussed again plan and goals of care. She stated she just wants patient to be comfortable and to "slip away". I discussed with her full comfort measures- including stopping medications not intended for comfort, giving medications to ease SOB, anxiety. Not titrating O2. I let her know that patient likely had hours to days when comfort medications are given. Encouraged her to call her pastor, that it would not be unnecessary or a wasted trip.  Camille requested meeting in the morning with PMT due to her son and family arriving later than expected this evening.  Let her know that comfort medications are ordered if patient declines further or if family desires to make transition sooner.   ROS  Length of Stay: 9  Current Medications: Scheduled Meds:  . carvedilol  3.125 mg Oral BID WC  . chlorhexidine  15 mL Mouth Rinse BID  . feeding supplement (PRO-STAT SUGAR  FREE 64)  30 mL Oral BID  . furosemide  60 mg Intravenous Q8H  . insulin aspart  0-15 Units Subcutaneous TID WC  . mouth rinse  15 mL Mouth Rinse q12n4p  . pantoprazole  40 mg Oral Daily    Continuous Infusions:   PRN Meds: bisacodyl, docusate, glycopyrrolate, haloperidol **OR** haloperidol **OR** haloperidol lactate, ipratropium-albuterol, LORazepam **OR** LORazepam **OR** LORazepam, morphine injection  Physical Exam  Pulmonary/Chest:  Increased effort, using accessory muscles  Neurological:  lethargic  Nursing note and vitals reviewed.           Vital Signs: BP 130/71 (BP  Location: Left Arm)   Pulse 91   Temp 98.2 F (36.8 C)   Resp (!) 29   Ht 5\' 9"  (1.753 m)   Wt 112.3 kg   SpO2 100%   BMI 36.56 kg/m  SpO2: SpO2: 100 % O2 Device: O2 Device: Venturi Mask O2 Flow Rate: O2 Flow Rate (L/min): 6 L/min  Intake/output summary:   Intake/Output Summary (Last 24 hours) at 04/04/2018 1535 Last data filed at 04/04/2018 1000 Gross per 24 hour  Intake -  Output 2250 ml  Net -2250 ml   LBM: Last BM Date: 04/02/18 Baseline Weight: Weight: 99.8 kg Most recent weight: Weight: 112.3 kg       Palliative Assessment/Data: PPS: 10%    Flowsheet Rows     Most Recent Value  Intake Tab  Referral Department  Hospitalist  Unit at Time of Referral  Intermediate Care Unit  Palliative Care Primary Diagnosis  Cardiac  Date Notified  04/03/18  Palliative Care Type  New Palliative care  Reason for referral  Clarify Goals of Care  Date of Admission  03/30/2018  Date first seen by Palliative Care  04/03/18  # of days Palliative referral response time  0 Day(s)  # of days IP prior to Palliative referral  8  Clinical Assessment  Psychosocial & Spiritual Assessment  Palliative Care Outcomes      Patient Active Problem List   Diagnosis Date Noted  . Pressure injury of skin 03/27/2018  . Cardiac arrest ([place]) 04/06/2018  . Bradycardia   . S/P AVR   . Hx of CABG     Palliative  Care Assessment & Plan   Patient Profile: 84 y.o. male  with past medical history of severe COPD, CAD/CABG, Aortic Valve replacement, colon cancer s/p resection in 2018, CVA, atrial fibrillation, DM who was admitted on 04/24/2018 with cardiac arrest.  The patient required 5 minutes of resuscitation prior to [place].  He was intubated in the ER for airway protection.  He has since been extubated, but has suffered with recurrent hypercarbia despite treatment with BiPap.  His hospitalization has also been complicated by diastolic heart failure decompensation and severe epistaxis.  He is now off anticoagulation due to epistaxis and hematuria.   Assessment/Recommendations/Plan   Continue current care  Transition to full comfort when family arrives and has time to visit  Comfort medications have been ordered  Goals of Care and Additional Recommendations:  Limitations on Scope of Treatment: Minimize Medications, No Artificial Feeding, No IV Antibiotics, No IV Fluids and No Lab Draws  Code Status:  DNR  Prognosis:   Hours - Days  Discharge Planning:  Anticipated Hospital Death  Care plan was discussed with patient's spouse.  Thank you for allowing the Palliative Medicine Team to assist in the care of this patient.   Time In: 1500 Time Out: 1535 Total Time 35 mins Prolonged Time Billed no      Greater than 50%  of this time was spent counseling and coordinating care related to the above assessment and plan.  Mariana Kaufman, AGNP-C Palliative Medicine   Please contact Palliative Medicine Team phone at [phone] for questions and concerns.

## 2018-04-05 DIAGNOSIS — Z515 Encounter for palliative care: Secondary | ICD-10-CM

## 2018-04-05 DIAGNOSIS — Z7189 Other specified counseling: Secondary | ICD-10-CM

## 2018-04-05 LAB — GLUCOSE, CAPILLARY: GLUCOSE-CAPILLARY: 119 mg/dL — AB (ref 70–99)

## 2018-04-05 MED ORDER — HALOPERIDOL 0.5 MG PO TABS: 0.5000 mg | ORAL_TABLET | ORAL | Status: DC | PRN

## 2018-04-05 MED ORDER — ACETAMINOPHEN 650 MG RE SUPP: 650.0000 mg | Freq: Four times a day (QID) | RECTAL | Status: DC | PRN

## 2018-04-05 MED ORDER — MORPHINE BOLUS VIA INFUSION
2.0000 mg | INTRAVENOUS | Status: DC | PRN
  Administered 2018-04-05 (×3): 2 mg via INTRAVENOUS
  Filled 2018-04-05: qty 2

## 2018-04-05 MED ORDER — GLYCOPYRROLATE 0.2 MG/ML IJ SOLN: 0.2000 mg | INTRAMUSCULAR | Status: DC | PRN

## 2018-04-05 MED ORDER — POLYVINYL ALCOHOL 1.4 % OP SOLN
1.0000 [drp] | Freq: Four times a day (QID) | OPHTHALMIC | Status: DC | PRN
  Filled 2018-04-05: qty 15

## 2018-04-05 MED ORDER — HALOPERIDOL LACTATE 2 MG/ML PO CONC: 0.5000 mg | ORAL | Status: DC | PRN

## 2018-04-05 MED ORDER — ACETAMINOPHEN 325 MG PO TABS: 650.0000 mg | ORAL_TABLET | Freq: Four times a day (QID) | ORAL | Status: DC | PRN

## 2018-04-05 MED ORDER — BIOTENE DRY MOUTH MT LIQD: 15.0000 mL | OROMUCOSAL | Status: DC | PRN

## 2018-04-05 MED ORDER — ONDANSETRON 4 MG PO TBDP: 4.0000 mg | ORAL_TABLET | Freq: Four times a day (QID) | ORAL | Status: DC | PRN

## 2018-04-05 MED ORDER — GLYCOPYRROLATE 1 MG PO TABS: 1.0000 mg | ORAL_TABLET | ORAL | Status: DC | PRN

## 2018-04-05 MED ORDER — HALOPERIDOL LACTATE 5 MG/ML IJ SOLN: 0.5000 mg | INTRAMUSCULAR | Status: DC | PRN

## 2018-04-05 MED ORDER — MORPHINE 100MG IN NS 100ML (1MG/ML) PREMIX INFUSION
1.0000 mg/h | INTRAVENOUS | Status: DC
  Administered 2018-04-05: 1 mg/h via INTRAVENOUS
  Filled 2018-04-05: qty 100

## 2018-04-05 MED ORDER — MORPHINE SULFATE (CONCENTRATE) 10 MG/0.5ML PO SOLN: 5.0000 mg | ORAL | Status: DC | PRN

## 2018-04-05 MED ORDER — ONDANSETRON HCL 4 MG/2ML IJ SOLN: 4.0000 mg | Freq: Four times a day (QID) | INTRAMUSCULAR | Status: DC | PRN

## 2018-04-07 ENCOUNTER — Encounter: Payer: Self-pay | Admitting: Adult Health

## 2018-04-25 NOTE — Death Summary Note (Signed)
DEATH SUMMARY   Patient Details  Name: Terry Wu MRN: [id number] DOB: [birth year]  Admission/Discharge Information   Admit Date:  15-Apr-2018  Date of Death: Date of Death: 04-25-2018  Time of Death: Time of Death: 1500  Length of Stay: 09-22-22  Referring Physician: Sinda Du, MD   Reason(s) for Hospitalization  Cardiac Arrest  Diagnoses  Preliminary cause of death:  Secondary Diagnoses (including complications and co-morbidities):  Active Problems:   Cardiac arrest ([place])   Pressure injury of skin   Advanced care planning/counseling discussion   Goals of care, counseling/discussion   Terminal care   Brief Hospital Course (including significant findings, care, treatment, and services provided and events leading to death)  Patient is a 84 y.o. male past medical history of CAD status post CABG/aortic valve replacement in 2006, history of colon cancer status post resection/colostomy in 1660 complicated by postoperative stroke, A. fib on Eliquis-admitted to the hospital on April 16, 2023 following a cardiac arrest with return of spontaneous circulation in approximately 5 minutes.  No shocks were administered.  Patient was intubated in the emergency room for airway protection, subsequently managed in the intensive care unit stabilized and transferred to the hospitalist service.  Subsequent hospital course has been complicated by hypercarbic respiratory failure, decompensated diastolic heart failure, epistaxis and hematuria.  Due to poor overall prognosis-ongoing failure to thrive syndrome/frailty-after extensive discussion with family-was transitioned to full comfort care measures.Patient was started on a morphine gtt and subsequently expired at 1500 hrs   Pertinent Labs and Studies  Significant Diagnostic Studies Ct Head Wo Contrast  Result Date: Apr 15, 2018 CLINICAL DATA:  C-spine trauma.  Cardiac arrest.  CPR for 5 minutes. EXAM: CT HEAD WITHOUT CONTRAST CT CERVICAL SPINE WITHOUT CONTRAST  TECHNIQUE: Multidetector CT imaging of the head and cervical spine was performed following the standard protocol without intravenous contrast. Multiplanar CT image reconstructions of the cervical spine were also generated. COMPARISON:  CT scan February 21, 2017 FINDINGS: CT HEAD FINDINGS Brain: No subdural, epidural, or subarachnoid hemorrhage. Cerebellum demonstrates no acute abnormalities. A tiny lacunar infarct is seen in the right cerebellar hemisphere on axial image 8. Basal cisterns are unchanged unremarkable. Ventricles and sulci are stable. No acute cortical ischemia or infarct. No mass effect or midline shift. Vascular: Calcified atherosclerosis in the intracranial carotids. Skull: Normal. Negative for fracture or focal lesion. Sinuses/Orbits: There is opacification of scattered left mastoid air cells, improved since February 21, 2017. The right mastoid air cells, middle ears are normal. Scattered opacification of ethmoid air cells with fluid in the sphenoid sinuses and a mucous retention cyst in the left maxillary sinus. Other: None. CT CERVICAL SPINE FINDINGS Alignment: Normal. Skull base and vertebrae: No acute fracture. No primary bone lesion or focal pathologic process. Soft tissues and spinal canal: No prevertebral fluid or swelling. No visible canal hematoma. Disc levels:  Multilevel degenerative changes. Upper chest: Mild opacity in the posterior aspect of the right upper lobe. Other: No other abnormalities. IMPRESSION: 1. No acute intracranial abnormalities. 2. No fracture or traumatic malalignment cervical spine. Multilevel degenerative changes. 3. Sinus disease as above. Electronically Signed   By: Dorise Bullion III M.D   On: 04-15-18 19:02   Ct Cervical Spine Wo Contrast  Result Date: 04-15-2018 CLINICAL DATA:  C-spine trauma.  Cardiac arrest.  CPR for 5 minutes. EXAM: CT HEAD WITHOUT CONTRAST CT CERVICAL SPINE WITHOUT CONTRAST TECHNIQUE: Multidetector CT imaging of the head and cervical  spine was performed following the standard protocol without intravenous contrast. Multiplanar  CT image reconstructions of the cervical spine were also generated. COMPARISON:  CT scan February 21, 2017 FINDINGS: CT HEAD FINDINGS Brain: No subdural, epidural, or subarachnoid hemorrhage. Cerebellum demonstrates no acute abnormalities. A tiny lacunar infarct is seen in the right cerebellar hemisphere on axial image 8. Basal cisterns are unchanged unremarkable. Ventricles and sulci are stable. No acute cortical ischemia or infarct. No mass effect or midline shift. Vascular: Calcified atherosclerosis in the intracranial carotids. Skull: Normal. Negative for fracture or focal lesion. Sinuses/Orbits: There is opacification of scattered left mastoid air cells, improved since February 21, 2017. The right mastoid air cells, middle ears are normal. Scattered opacification of ethmoid air cells with fluid in the sphenoid sinuses and a mucous retention cyst in the left maxillary sinus. Other: None. CT CERVICAL SPINE FINDINGS Alignment: Normal. Skull base and vertebrae: No acute fracture. No primary bone lesion or focal pathologic process. Soft tissues and spinal canal: No prevertebral fluid or swelling. No visible canal hematoma. Disc levels:  Multilevel degenerative changes. Upper chest: Mild opacity in the posterior aspect of the right upper lobe. Other: No other abnormalities. IMPRESSION: 1. No acute intracranial abnormalities. 2. No fracture or traumatic malalignment cervical spine. Multilevel degenerative changes. 3. Sinus disease as above. Electronically Signed   By: Dorise Bullion III M.D   On: 04/04/2018 19:02   [name] Head Wo Contrast  Result Date: 03/27/2018 CLINICAL DATA:  84 y/o M; cardiac arrest, CPR, ROSC in 5 minutes, total down time 10 minutes. EXAM: MRI HEAD WITHOUT CONTRAST MRA HEAD WITHOUT CONTRAST MRA NECK WITHOUT CONTRAST TECHNIQUE: Multiplanar, multiecho pulse sequences of the brain and surrounding  structures were obtained without intravenous contrast. Angiographic images of the Circle of Willis were obtained using MRA technique without intravenous contrast. Angiographic images of the neck were obtained using MRA technique without intravenous contrast. Carotid stenosis measurements (when applicable) are obtained utilizing NASCET criteria, using the distal internal carotid diameter as the denominator. COMPARISON:  02/10/2017 MRA head and neck. 02/09/2017 MRI head. 04/19/2018 CT head and cervical spine. FINDINGS: MRI HEAD FINDINGS Brain: No acute infarction, hemorrhage, hydrocephalus, extra-axial collection or mass lesion. 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 Small chronic infarcts in the right cerebellar hemisphere. There are several foci of susceptibility hypointensity within the bilateral cerebral hemispheres and the cerebellum compatible with hemosiderin deposition from prior microhemorrhage, grossly stable from prior MRI given differences in technique and motion artifact. There is hemosiderin staining of the right superior cerebellar chronic infarction. Scattered background of nonspecific T2 FLAIR hyperintensities in white matter compatible with mild chronic microvascular ischemic changes and there is mild volume loss of the brain. Vascular: As below. Skull and upper cervical spine: Normal marrow signal. Sinuses/Orbits: Diffuse paranasal sinus mucosal thickening and fluid levels as well as left mastoid opacification, probably due to intubation. No abnormal signal of the right mastoid air cells. Orbits are unremarkable. Other: None. MRA HEAD FINDINGS Internal carotid arteries: Patent. Carotid siphon irregularity compatible with atherosclerosis. No significant stenosis. Anterior cerebral arteries:  Patent. Middle cerebral arteries: Patent. Anterior communicating artery: Patent. Posterior communicating arteries: Fetal left PCA. No right posterior communicating artery  identified, likely hypoplastic or absent. Posterior cerebral arteries: Patent. Small caliber right vertebral artery largely terminates in the right PICA with minimal contribution to the basilar. Basilar artery:  Patent. Vertebral arteries:  Patent. No evidence of high-grade stenosis, large vessel occlusion, or aneurysm unless noted above. MRA NECK FINDINGS Aortic arch: Not included within the field of  view. Right common carotid artery: Patent. Right internal carotid artery: Patent. Suspected severe proximal right ICA stenosis. Right vertebral artery: Patent. Left common carotid artery: Patent. Left Internal carotid artery: Patent.Mild irregularity of left proximal ICA compatible with atherosclerosis without significant stenosis. Hairpin turn of mid left cervical ICA. Left Vertebral artery: Patent.  Left dominant. Motion degraded study. IMPRESSION: MRI head: 1. No acute intracranial abnormality identified. No findings of hypoxic ischemic injury to the brain at this time. 2. Small chronic infarctions are present in the left frontal lobe and right cerebellar hemisphere. 3. Stable background of mild chronic microvascular ischemic changes and volume loss of the brain. 4. Stable scattered foci of chronic microhemorrhage probably related to chronic hypertension. MRA head: 1. Patent anterior and posterior intracranial circulation. No large vessel occlusion, high-grade stenosis, or aneurysm identified. 2. Atherosclerosis of carotid siphons without significant stenosis. MRA neck: 1. Motion degraded study. 2. Patent carotid and vertebral arteries. 3. Suspected severe proximal right ICA stenosis. Carotid ultrasound recommended to further assess. Electronically Signed   By: Kristine Garbe M.D.   On: 03/27/2018 01:49   [name] Neck Wo Contrast  Result Date: 03/27/2018 CLINICAL DATA:  84 y/o M; cardiac arrest, CPR, ROSC in 5 minutes, total down time 10 minutes. EXAM: MRI HEAD WITHOUT CONTRAST MRA HEAD WITHOUT CONTRAST  MRA NECK WITHOUT CONTRAST TECHNIQUE: Multiplanar, multiecho pulse sequences of the brain and surrounding structures were obtained without intravenous contrast. Angiographic images of the Circle of Willis were obtained using MRA technique without intravenous contrast. Angiographic images of the neck were obtained using MRA technique without intravenous contrast. Carotid stenosis measurements (when applicable) are obtained utilizing NASCET criteria, using the distal internal carotid diameter as the denominator. COMPARISON:  02/10/2017 MRA head and neck. 02/09/2017 MRI head. 04/07/2018 CT head and cervical spine. FINDINGS: MRI HEAD FINDINGS Brain: No acute infarction, hemorrhage, hydrocephalus, extra-axial collection or mass lesion. 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 Small chronic infarcts in the right cerebellar hemisphere. There are several foci of susceptibility hypointensity within the bilateral cerebral hemispheres and the cerebellum compatible with hemosiderin deposition from prior microhemorrhage, grossly stable from prior MRI given differences in technique and motion artifact. There is hemosiderin staining of the right superior cerebellar chronic infarction. Scattered background of nonspecific T2 FLAIR hyperintensities in white matter compatible with mild chronic microvascular ischemic changes and there is mild volume loss of the brain. Vascular: As below. Skull and upper cervical spine: Normal marrow signal. Sinuses/Orbits: Diffuse paranasal sinus mucosal thickening and fluid levels as well as left mastoid opacification, probably due to intubation. No abnormal signal of the right mastoid air cells. Orbits are unremarkable. Other: None. MRA HEAD FINDINGS Internal carotid arteries: Patent. Carotid siphon irregularity compatible with atherosclerosis. No significant stenosis. Anterior cerebral arteries:  Patent. Middle cerebral arteries: Patent. Anterior communicating  artery: Patent. Posterior communicating arteries: Fetal left PCA. No right posterior communicating artery identified, likely hypoplastic or absent. Posterior cerebral arteries: Patent. Small caliber right vertebral artery largely terminates in the right PICA with minimal contribution to the basilar. Basilar artery:  Patent. Vertebral arteries:  Patent. No evidence of high-grade stenosis, large vessel occlusion, or aneurysm unless noted above. MRA NECK FINDINGS Aortic arch: Not included within the field of view. Right common carotid artery: Patent. Right internal carotid artery: Patent. Suspected severe proximal right ICA stenosis. Right vertebral artery: Patent. Left common carotid artery: Patent. Left Internal carotid artery: Patent.Mild irregularity of left proximal ICA compatible with atherosclerosis without significant  stenosis. Hairpin turn of mid left cervical ICA. Left Vertebral artery: Patent.  Left dominant. Motion degraded study. IMPRESSION: MRI head: 1. No acute intracranial abnormality identified. No findings of hypoxic ischemic injury to the brain at this time. 2. Small chronic infarctions are present in the left frontal lobe and right cerebellar hemisphere. 3. Stable background of mild chronic microvascular ischemic changes and volume loss of the brain. 4. Stable scattered foci of chronic microhemorrhage probably related to chronic hypertension. MRA head: 1. Patent anterior and posterior intracranial circulation. No large vessel occlusion, high-grade stenosis, or aneurysm identified. 2. Atherosclerosis of carotid siphons without significant stenosis. MRA neck: 1. Motion degraded study. 2. Patent carotid and vertebral arteries. 3. Suspected severe proximal right ICA stenosis. Carotid ultrasound recommended to further assess. Electronically Signed   By: Kristine Garbe M.D.   On: 03/27/2018 01:49   Mr Brain Wo Contrast  Result Date: 03/27/2018 CLINICAL DATA:  84 y/o M; cardiac arrest, CPR,  ROSC in 5 minutes, total down time 10 minutes. EXAM: MRI HEAD WITHOUT CONTRAST MRA HEAD WITHOUT CONTRAST MRA NECK WITHOUT CONTRAST TECHNIQUE: Multiplanar, multiecho pulse sequences of the brain and surrounding structures were obtained without intravenous contrast. Angiographic images of the Circle of Willis were obtained using MRA technique without intravenous contrast. Angiographic images of the neck were obtained using MRA technique without intravenous contrast. Carotid stenosis measurements (when applicable) are obtained utilizing NASCET criteria, using the distal internal carotid diameter as the denominator. COMPARISON:  02/10/2017 MRA head and neck. 02/09/2017 MRI head. 04/19/2018 CT head and cervical spine. FINDINGS: MRI HEAD FINDINGS Brain: No acute infarction, hemorrhage, hydrocephalus, extra-axial collection or mass lesion. 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 Small chronic infarcts in the right cerebellar hemisphere. There are several foci of susceptibility hypointensity within the bilateral cerebral hemispheres and the cerebellum compatible with hemosiderin deposition from prior microhemorrhage, grossly stable from prior MRI given differences in technique and motion artifact. There is hemosiderin staining of the right superior cerebellar chronic infarction. Scattered background of nonspecific T2 FLAIR hyperintensities in white matter compatible with mild chronic microvascular ischemic changes and there is mild volume loss of the brain. Vascular: As below. Skull and upper cervical spine: Normal marrow signal. Sinuses/Orbits: Diffuse paranasal sinus mucosal thickening and fluid levels as well as left mastoid opacification, probably due to intubation. No abnormal signal of the right mastoid air cells. Orbits are unremarkable. Other: None. MRA HEAD FINDINGS Internal carotid arteries: Patent. Carotid siphon irregularity compatible with atherosclerosis. No significant  stenosis. Anterior cerebral arteries:  Patent. Middle cerebral arteries: Patent. Anterior communicating artery: Patent. Posterior communicating arteries: Fetal left PCA. No right posterior communicating artery identified, likely hypoplastic or absent. Posterior cerebral arteries: Patent. Small caliber right vertebral artery largely terminates in the right PICA with minimal contribution to the basilar. Basilar artery:  Patent. Vertebral arteries:  Patent. No evidence of high-grade stenosis, large vessel occlusion, or aneurysm unless noted above. MRA NECK FINDINGS Aortic arch: Not included within the field of view. Right common carotid artery: Patent. Right internal carotid artery: Patent. Suspected severe proximal right ICA stenosis. Right vertebral artery: Patent. Left common carotid artery: Patent. Left Internal carotid artery: Patent.Mild irregularity of left proximal ICA compatible with atherosclerosis without significant stenosis. Hairpin turn of mid left cervical ICA. Left Vertebral artery: Patent.  Left dominant. Motion degraded study. IMPRESSION: MRI head: 1. No acute intracranial abnormality identified. No findings of hypoxic ischemic injury to the brain at this time. 2. Small chronic  infarctions are present in the left frontal lobe and right cerebellar hemisphere. 3. Stable background of mild chronic microvascular ischemic changes and volume loss of the brain. 4. Stable scattered foci of chronic microhemorrhage probably related to chronic hypertension. MRA head: 1. Patent anterior and posterior intracranial circulation. No large vessel occlusion, high-grade stenosis, or aneurysm identified. 2. Atherosclerosis of carotid siphons without significant stenosis. MRA neck: 1. Motion degraded study. 2. Patent carotid and vertebral arteries. 3. Suspected severe proximal right ICA stenosis. Carotid ultrasound recommended to further assess. Electronically Signed   By: Kristine Garbe M.D.   On: 03/27/2018  01:49   Dg Chest Port 1 View  Result Date: 04/01/2018 CLINICAL DATA:  Dyspnea EXAM: PORTABLE CHEST 1 VIEW COMPARISON:  03/30/2018 FINDINGS: Cardiac shadow remains enlarged. Postsurgical changes are again seen. Bilateral pleural effusions are noted increasing on the right when compared with the prior exam. Persistent perihilar opacities are noted. No new focal abnormality is seen. IMPRESSION: Increasing right-sided pleural effusion. The remainder of the exam is stable from the prior study. Electronically Signed   By: Inez Catalina M.D.   On: 04/01/2018 09:53   Dg Chest Port 1 View  Result Date: 03/30/2018 CLINICAL DATA:  Post code and CPR. EXAM: PORTABLE CHEST 1 VIEW COMPARISON:  03/29/2018 FINDINGS: Prior CABG and aortic valve replacement. Cardiomegaly. Bilateral perihilar and lower lobe opacities with small to moderate layering effusions. No significant change since prior study. IMPRESSION: No interval change. Electronically Signed   By: Rolm Baptise M.D.   On: 03/30/2018 10:56   Dg Chest Port 1 View  Result Date: 03/29/2018 CLINICAL DATA:  Respiratory failure EXAM: PORTABLE CHEST 1 VIEW COMPARISON:  03/28/2018 FINDINGS: Interval removal of endotracheal tube and NG tube. Cardiomegaly. Diffuse bilateral airspace opacities have worsened since prior study, likely worsening edema. Small to moderate layering bilateral effusions, also increased since prior study. IMPRESSION: Interval worsening it in diffuse bilateral airspace disease, likely worsening edema/CHF. Enlarging small to moderate bilateral effusions. Electronically Signed   By: Rolm Baptise M.D.   On: 03/29/2018 08:04   Dg Chest Port 1 View  Result Date: 03/28/2018 CLINICAL DATA:  Cardiac arrest EXAM: PORTABLE CHEST 1 VIEW COMPARISON:  03/27/2018 FINDINGS: Endotracheal and NG tubes are stable. Heart remains enlarged. Upper mediastinum remains prominent. Postoperative changes from CABG. Vascular congestion is stable. Stable haziness at both lung  bases likely represents a combination of volume loss and pleural fluid. No pneumothorax. IMPRESSION: Stable vascular congestion and bibasilar haziness as described. Electronically Signed   By: Marybelle Killings M.D.   On: 03/28/2018 08:02   Dg Chest Port 1 View  Result Date: 03/27/2018 CLINICAL DATA:  Check endotracheal tube placement EXAM: PORTABLE CHEST 1 VIEW COMPARISON:  04/01/2018 FINDINGS: Cardiac shadow remains enlarged. Postsurgical changes are again identified. Endotracheal tube and nasogastric catheter are noted in satisfactory position. The lungs are well aerated with some patchy right basilar opacities new from the prior exam. Small bilateral pleural effusion are noted as well. No pneumothorax is seen. IMPRESSION: Increase in right basilar opacities with small bilateral pleural effusions. Electronically Signed   By: Inez Catalina M.D.   On: 03/27/2018 08:37   Dg Chest Portable 1 View  Result Date: 04/01/2018 CLINICAL DATA:  Intubated EXAM: PORTABLE CHEST 1 VIEW COMPARISON:  02/19/2017, 02/21/2017 FINDINGS: Endotracheal tube tip is about 3.8 cm superior to the carina. Esophageal tube tip is below the diaphragm but non included. Post sternotomy changes. Cardiomegaly with mild central congestion. Similar appearance of left pleural blunting.  Aortic atherosclerosis. No pneumothorax. IMPRESSION: 1. Endotracheal tube tip about 3.8 cm superior to the carina. Esophageal tube tip below the diaphragm but non included. 2. Cardiomegaly with vascular congestion Electronically Signed   By: Donavan Foil M.D.   On: 03/29/2018 17:15    Microbiology Recent Results (from the past 240 hour(s))  Culture, blood (routine x 2)     Status: None   Collection Time: 04/13/2018  6:16 PM  Result Value Ref Range Status   Specimen Description BLOOD RIGHT ARM  Final   Special Requests   Final    BOTTLES DRAWN AEROBIC AND ANAEROBIC Blood Culture adequate volume   Culture   Final    NO GROWTH 5 DAYS Performed at [facility], [address] [place], [place] [address]    Report Status 03/31/2018 FINAL  Final  MRSA PCR Screening     Status: None   Collection Time: 04/16/2018  9:50 PM  Result Value Ref Range Status   MRSA by PCR NEGATIVE NEGATIVE Final    Comment:        The GeneXpert MRSA Assay (FDA approved for NASAL specimens only), is one component of a comprehensive MRSA colonization surveillance program. It is not intended to diagnose MRSA infection nor to guide or monitor treatment for MRSA infections. Performed at [facility], [place] [address], [place], [place] [address]   Culture, blood (routine x 2)     Status: None   Collection Time: 04/23/2018 10:15 PM  Result Value Ref Range Status   Specimen Description BLOOD RIGHT HAND  Final   Special Requests   Final    BOTTLES DRAWN AEROBIC ONLY Blood Culture adequate volume   Culture   Final    NO GROWTH 5 DAYS Performed at [facility], [place] [address], [place], [place] [address]    Report Status 03/31/2018 FINAL  Final  Culture, respiratory (non-expectorated)     Status: None   Collection Time: 03/27/18 11:29 AM  Result Value Ref Range Status   Specimen Description TRACHEAL ASPIRATE  Final   Special Requests NONE  Final   Gram Stain   Final    NO SQUAMOUS EPITHELIAL CELLS PRESENT ABUNDANT WBC PRESENT, PREDOMINANTLY PMN RARE GRAM POSITIVE COCCI Performed at [facility], [place] [address], [place], [place] [address]    Culture FEW STAPHYLOCOCCUS AUREUS  Final   Report Status 03/29/2018 FINAL  Final   Organism ID, Bacteria STAPHYLOCOCCUS AUREUS  Final      Susceptibility   Staphylococcus aureus - MIC*    CIPROFLOXACIN <=0.5 SENSITIVE Sensitive     ERYTHROMYCIN >=8 RESISTANT Resistant     GENTAMICIN <=0.5 SENSITIVE Sensitive     OXACILLIN 0.5 SENSITIVE Sensitive     TETRACYCLINE <=1 SENSITIVE Sensitive     VANCOMYCIN 1 SENSITIVE Sensitive     TRIMETH/SULFA <=10 SENSITIVE Sensitive     CLINDAMYCIN <=0.25 SENSITIVE  Sensitive     RIFAMPIN <=0.5 SENSITIVE Sensitive     Inducible Clindamycin NEGATIVE Sensitive     * FEW STAPHYLOCOCCUS AUREUS    Lab Basic Metabolic Panel: Recent Labs  Lab 03/30/18 0230 03/31/18 0420 04/01/18 0237 04/02/18 0520 04/03/18 0403  NA 134* 134* 135 137 138  K 4.0 4.2 4.2 3.7 3.6  CL 95* 95* 92* 93* 92*  CO2 31 31 33* 35* 39*  GLUCOSE 127* 152* 144* 118* 172*  BUN 11 11 14 18 19   CREATININE 0.81 0.85 0.92 0.96 0.90  CALCIUM 8.2*  8.2* 8.6* 8.4* 8.8*   Liver Function Tests: No results for input(s): AST, ALT, ALKPHOS, BILITOT, PROT, ALBUMIN in the last 168 hours. No results for input(s): LIPASE, AMYLASE in the last 168 hours. No results for input(s): AMMONIA in the last 168 hours. CBC: Recent Labs  Lab 03/30/18 0230 03/31/18 0420 04/02/18 0520 04/03/18 0403  WBC 11.5* 9.9 6.9 8.1  HGB 9.2* 9.3* 8.5* 8.9*  HCT 29.9* 29.7* 28.1* 28.9*  MCV 93.4 94.0 93.4 93.8  PLT 174 168 163 175   Cardiac Enzymes: No results for input(s): CKTOTAL, CKMB, CKMBINDEX, TROPONINI in the last 168 hours. Sepsis Labs: Recent Labs  Lab 03/30/18 0230 03/31/18 0420 04/02/18 0520 04/03/18 0403  WBC 11.5* 9.9 6.9 8.1    Procedures/Operations     Shanker Ghimire 2018-04-23, 4:00 PM

## 2018-04-25 NOTE — Progress Notes (Signed)
Pt refused to be repositioned, stating he is comfortable. Pt and family educated on importance of repositioning in order to provide adequate blood flow and prevent pressure injury.

## 2018-04-25 NOTE — Progress Notes (Signed)
Patient lying in bed with 0 respirations, no heartbeat auscultated by this nurse or Rosalio Loud RN. Dr. Sloan Leiter MD notified. Family at bedside.   62 mls of morphine wasted from bag, 23 mls wasted from primary tubing with [place] RN.

## 2018-04-25 NOTE — Progress Notes (Signed)
PROGRESS NOTE        PATIENT DETAILS Name: Terry Wu Age: 84 y.o. Sex: male Date of Birth: Mar 14, 1932 Admit Date: 03/29/2018 Admitting Physician Kandice Hams, MD EYC:[id number], Percell Miller, MD  Brief Narrative: Patient is a 84 y.o. male past medical history of CAD status post CABG/aortic valve replacement in 2006, history of colon cancer status post resection/colostomy in 6314 complicated by postoperative stroke, A. fib on Eliquis-admitted to the hospital on 10/2 following a cardiac arrest with return of spontaneous circulation in approximately 5 minutes.  No shocks were administered.  Patient was intubated in the emergency room for airway protection, subsequently managed in the intensive care unit stabilized and transferred to the hospitalist service.  Subsequent hospital course has been complicated by hypercarbic respiratory failure, decompensated diastolic heart failure, epistaxis and hematuria.  Due to poor overall prognosis-ongoing failure to thrive syndrome/frailty-after extensive discussion with family-he has now been transition to full comfort care measures.  Subjective: Lethargic-only opens eyes-really does not speak.  Son spouse at bedside-son expresses understanding that his dad is dying-and wants to transition to full comfort measures.  Assessment/Plan: Cardiac arrest: Reviewed H&P/ED notes, had approximately 5 minutes of downtime-with ROSC in 5 minutes.  Shocks were never administered.  Suspect this may have been a respiratory event causing cardiac arrest.  Echocardiogram with preserved EF.  Telemetry with A. fib.  He was initially managed in the [place] stability-he was transferred to the hospitalist service.   However due to continued functional decline-numerous issues as outlined below-after extensive discussion with family-he is now been transition to full comfort measures.  Acute on chronic diastolic heart failure: Status is somewhat  improved-but due to continued decline/lethargy-is been transition to full comfort measures-diuretics have been discontinued.  No role for daily labs. t.    Acute hypercarbic respiratory failure: Worsened on 10/8 with encephalopathy requiring prolonged BiPAP.  He subsequently improved-and became much more awake and alert-however has started to deteriorate again.  He is much more lethargic obtunded this morning.  After extensive discussion by this MD and by the palliative care team-we have transition him to full comfort measures.  Discontinue BiPAP.  MSSA pneumonia: Sputum cultures positive for MSSA-treated with doxycycline and however this has been discontinued he has been transitioned to comfort measures..    Right-sided pleural effusion: Suspect secondary to heart failure-plans were to pursue thoracocentesis if he worsens-however he has now been transition to full comfort measures.  He is no longer on diuretics.   Epistaxis: Occurred on 10/9-Eliquis stopped-since kept on recurring-ENT consulted and nasal packing placed.  Plans to resume Eliquis as comfort measures in effect.  Hematuria: Secondary to Foley trauma-this occurred on 10/9-Eliquis on hold-with periodic Foley flushes-hematuria has resolved.    Persistent atrial fibrillation: Continues to have A. fib on telemetry-off all rate control agents and anticoagulation as he has been transition to full comfort measures.    Hyponatremia: Secondary to hypervolemia-resolved with diuretics- no role to check electrolytes at this point.  Hypokalemia: Resolved with repletion- no role to check electrolytes at this point.  Normocytic anemia: Probably secondary to acute illness-no indication for transfusion as of now-follow.  DM-2: Stop CBGs-stop SSI-comfort measures in effect.  Hypertension: Stop Coreg and Lasix-comfort measures in effect.  COPD: Lungs without rhonchi-has some bibasilar rales-continue bronchodilators-no role for steroids at this  time.  GERD: PPI-comfort measures in effect.  Dysphagia: Was on a dysphagia 3 diet-okay for comfort feeds if family desires.    Palliative care: DNR in place-Hospital course complicated by numerous medical issues-intermittent encephalopathy continues, failure to thrive syndrome continues as well-he is incredibly weak and debilitated.  Given above-noted issues and underlying frailty-prolonged discussion with family by this MD and palliative care team-he has been transition to full comfort measures.  Suspect inpatient death over the next few days.  Started on a morphine infusion by palliative care.   DVT Prophylaxis: Eliquis held on 10/9  Code Status: DNR  Family Communication: Spouse at bedside  Disposition Plan: Remain inpatient-suspect inpatient death over the next few days.  Antimicrobial agents: Anti-infectives (From admission, onward)   Start     Dose/Rate Route Frequency Ordered Stop   04/03/18 1000  doxycycline (VIBRA-TABS) tablet 100 mg  Status:  Discontinued     100 mg Oral Every 12 hours 04/03/18 0915 04/03/18 1356   04/01/18 1200  doxycycline (VIBRAMYCIN) 100 mg in sodium chloride 0.9 % 250 mL IVPB  Status:  Discontinued     100 mg 125 mL/hr over 120 Minutes Intravenous Every 12 hours 04/01/18 1122 04/03/18 0914   03/30/18 1030  doxycycline (VIBRA-TABS) tablet 100 mg  Status:  Discontinued     100 mg Oral Every 12 hours 03/30/18 1014 04/01/18 1122   03/29/18 1400  vancomycin (VANCOCIN) 1,250 mg in sodium chloride 0.9 % 250 mL IVPB  Status:  Discontinued     1,250 mg 166.7 mL/hr over 90 Minutes Intravenous Every 24 hours 03/28/18 1131 03/30/18 1014   03/28/18 1230  vancomycin (VANCOCIN) 2,000 mg in sodium chloride 0.9 % 500 mL IVPB     2,000 mg 250 mL/hr over 120 Minutes Intravenous  Once 03/28/18 1131 03/28/18 1557      Procedures: 10/2>> ETT  CONSULTS: PCCM ENT Palliative care  Time spent: 35 min-Greater than 50% of this time was spent in counseling,  explanation of diagnosis, planning of further management, and coordination of care.  MEDICATIONS: Scheduled Meds: . chlorhexidine  15 mL Mouth Rinse BID  . mouth rinse  15 mL Mouth Rinse q12n4p   Continuous Infusions: . morphine 1 mg/hr (2018-04-13 1004)   PRN Meds:.acetaminophen **OR** acetaminophen, antiseptic oral rinse, [DISCONTINUED] glycopyrrolate **OR** [DISCONTINUED] glycopyrrolate **OR** glycopyrrolate, [DISCONTINUED] haloperidol **OR** [DISCONTINUED] haloperidol **OR** haloperidol lactate, [DISCONTINUED] LORazepam **OR** [DISCONTINUED] LORazepam **OR** LORazepam, morphine, ondansetron **OR** ondansetron (ZOFRAN) IV, polyvinyl alcohol   PHYSICAL EXAM: Vital signs: Vitals:   2018/04/13 0046 Apr 13, 2018 0401 2018-04-13 0403 04-13-18 0550  BP:  (!) 147/95  (!) 152/90  Pulse:  74  85  Resp:  (!) 44  (!) 32  Temp: 98.9 F (37.2 C)  98.5 F (36.9 C)   TempSrc: Oral  Oral   SpO2:  91%  98%  Weight:      Height:       Filed Weights   03/29/18 0546 03/30/18 0600 03/31/18 0500  Weight: 111.1 kg 110.7 kg 112.3 kg   Body mass index is 36.56 kg/m.   General appearance: Lethargic/obtunded Eyes:no scleral icterus. HEENT: Atraumatic and Normocephalic Neck: supple, no JVD. Resp:Good air entry bilaterally,some scattered rhonchi CVS: S1 S2 regular, no murmurs.  GI: Bowel sounds present, Non tender and not distended with no gaurding, rigidity or rebound. Extremities: B/L Lower ++ edema, both legs are warm to touch Musculoskeletal:No digital cyanosis Skin:No Rash, warm and dry Wounds:N/A  I have personally reviewed following labs and imaging studies  LABORATORY DATA: CBC: Recent Labs  Lab 03/30/18  0230 03/31/18 0420 04/02/18 0520 04/03/18 0403  WBC 11.5* 9.9 6.9 8.1  HGB 9.2* 9.3* 8.5* 8.9*  HCT 29.9* 29.7* 28.1* 28.9*  MCV 93.4 94.0 93.4 93.8  PLT 174 168 163 130    Basic Metabolic Panel: Recent Labs  Lab 03/30/18 0230 03/31/18 0420 04/01/18 0237 04/02/18 0520  04/03/18 0403  NA 134* 134* 135 137 138  K 4.0 4.2 4.2 3.7 3.6  CL 95* 95* 92* 93* 92*  CO2 31 31 33* 35* 39*  GLUCOSE 127* 152* 144* 118* 172*  BUN 11 11 14 18 19   CREATININE 0.81 0.85 0.92 0.96 0.90  CALCIUM 8.2* 8.2* 8.6* 8.4* 8.8*    GFR: Estimated Creatinine Clearance: 75.4 mL/min (by C-G formula based on SCr of 0.9 mg/dL).  Liver Function Tests: No results for input(s): AST, ALT, ALKPHOS, BILITOT, PROT, ALBUMIN in the last 168 hours. No results for input(s): LIPASE, AMYLASE in the last 168 hours. No results for input(s): AMMONIA in the last 168 hours.  Coagulation Profile: No results for input(s): INR, PROTIME in the last 168 hours.  Cardiac Enzymes: No results for input(s): CKTOTAL, CKMB, CKMBINDEX, TROPONINI in the last 168 hours.  BNP (last 3 results) No results for input(s): PROBNP in the last 8760 hours.  HbA1C: No results for input(s): HGBA1C in the last 72 hours.  CBG: Recent Labs  Lab 04/04/18 0808 04/04/18 1215 04/04/18 1711 04/04/18 2330 Apr 21, 2018 0758  GLUCAP 138* 145* 123* 145* 119*    Lipid Profile: No results for input(s): CHOL, HDL, LDLCALC, TRIG, CHOLHDL, LDLDIRECT in the last 72 hours.  Thyroid Function Tests: No results for input(s): TSH, T4TOTAL, FREET4, T3FREE, THYROIDAB in the last 72 hours.  Anemia Panel: No results for input(s): VITAMINB12, FOLATE, FERRITIN, TIBC, IRON, RETICCTPCT in the last 72 hours.  Urine analysis:    Component Value Date/Time   COLORURINE YELLOW 03/30/2018 1710   APPEARANCEUR HAZY (A) 04/11/2018 1710   LABSPEC 1.013 03/25/2018 1710   PHURINE 5.0 04/14/2018 1710   GLUCOSEU NEGATIVE 03/25/2018 1710   HGBUR LARGE (A) 04/14/2018 1710   BILIRUBINUR NEGATIVE 03/31/2018 1710   KETONESUR NEGATIVE 03/29/2018 1710   PROTEINUR 100 (A) 03/30/2018 1710   NITRITE NEGATIVE 04/15/2018 1710   LEUKOCYTESUR NEGATIVE 03/25/2018 1710    Sepsis Labs: Lactic Acid, Venous    Component Value Date/Time   LATICACIDVEN 1.81  04/11/2018 1809    MICROBIOLOGY: Recent Results (from the past 240 hour(s))  Culture, blood (routine x 2)     Status: None   Collection Time: 03/31/2018  6:16 PM  Result Value Ref Range Status   Specimen Description BLOOD RIGHT ARM  Final   Special Requests   Final    BOTTLES DRAWN AEROBIC AND ANAEROBIC Blood Culture adequate volume   Culture   Final    NO GROWTH 5 DAYS Performed at [facility], [place] [address] [place], [place] [address]    Report Status 03/31/2018 FINAL  Final  MRSA PCR Screening     Status: None   Collection Time: 04/04/2018  9:50 PM  Result Value Ref Range Status   MRSA by PCR NEGATIVE NEGATIVE Final    Comment:        The GeneXpert MRSA Assay (FDA approved for NASAL specimens only), is one component of a comprehensive MRSA colonization surveillance program. It is not intended to diagnose MRSA infection nor to guide or monitor treatment for MRSA infections. Performed at [facility], [place] [address], [place],  [address]  Culture, blood (routine x 2)     Status: None   Collection Time: 04/16/2018 10:15 PM  Result Value Ref Range Status   Specimen Description BLOOD RIGHT HAND  Final   Special Requests   Final    BOTTLES DRAWN AEROBIC ONLY Blood Culture adequate volume   Culture   Final    NO GROWTH 5 DAYS Performed at [facility], [address], [place], [place] [address]    Report Status 03/31/2018 FINAL  Final  Culture, respiratory (non-expectorated)     Status: None   Collection Time: 03/27/18 11:29 AM  Result Value Ref Range Status   Specimen Description TRACHEAL ASPIRATE  Final   Special Requests NONE  Final   Gram Stain   Final    NO SQUAMOUS EPITHELIAL CELLS PRESENT ABUNDANT WBC PRESENT, PREDOMINANTLY PMN RARE GRAM POSITIVE COCCI Performed at [facility], [place] [address], [place], [place] [address]    Culture FEW STAPHYLOCOCCUS AUREUS  Final   Report Status 03/29/2018 FINAL  Final   Organism ID, Bacteria  STAPHYLOCOCCUS AUREUS  Final      Susceptibility   Staphylococcus aureus - MIC*    CIPROFLOXACIN <=0.5 SENSITIVE Sensitive     ERYTHROMYCIN >=8 RESISTANT Resistant     GENTAMICIN <=0.5 SENSITIVE Sensitive     OXACILLIN 0.5 SENSITIVE Sensitive     TETRACYCLINE <=1 SENSITIVE Sensitive     VANCOMYCIN 1 SENSITIVE Sensitive     TRIMETH/SULFA <=10 SENSITIVE Sensitive     CLINDAMYCIN <=0.25 SENSITIVE Sensitive     RIFAMPIN <=0.5 SENSITIVE Sensitive     Inducible Clindamycin NEGATIVE Sensitive     * FEW STAPHYLOCOCCUS AUREUS    RADIOLOGY STUDIES/RESULTS: Ct Head Wo Contrast  Result Date: 03/31/2018 CLINICAL DATA:  C-spine trauma.  Cardiac arrest.  CPR for 5 minutes. EXAM: CT HEAD WITHOUT CONTRAST CT CERVICAL SPINE WITHOUT CONTRAST TECHNIQUE: Multidetector CT imaging of the head and cervical spine was performed following the standard protocol without intravenous contrast. Multiplanar CT image reconstructions of the cervical spine were also generated. COMPARISON:  CT scan February 21, 2017 FINDINGS: CT HEAD FINDINGS Brain: No subdural, epidural, or subarachnoid hemorrhage. Cerebellum demonstrates no acute abnormalities. A tiny lacunar infarct is seen in the right cerebellar hemisphere on axial image 8. Basal cisterns are unchanged unremarkable. Ventricles and sulci are stable. No acute cortical ischemia or infarct. No mass effect or midline shift. Vascular: Calcified atherosclerosis in the intracranial carotids. Skull: Normal. Negative for fracture or focal lesion. Sinuses/Orbits: There is opacification of scattered left mastoid air cells, improved since February 21, 2017. The right mastoid air cells, middle ears are normal. Scattered opacification of ethmoid air cells with fluid in the sphenoid sinuses and a mucous retention cyst in the left maxillary sinus. Other: None. CT CERVICAL SPINE FINDINGS Alignment: Normal. Skull base and vertebrae: No acute fracture. No primary bone lesion or focal pathologic  process. Soft tissues and spinal canal: No prevertebral fluid or swelling. No visible canal hematoma. Disc levels:  Multilevel degenerative changes. Upper chest: Mild opacity in the posterior aspect of the right upper lobe. Other: No other abnormalities. IMPRESSION: 1. No acute intracranial abnormalities. 2. No fracture or traumatic malalignment cervical spine. Multilevel degenerative changes. 3. Sinus disease as above. Electronically Signed   By: Dorise Bullion III M.D   On: 04/10/2018 19:02   Ct Cervical Spine Wo Contrast  Result Date: 04/12/2018 CLINICAL DATA:  C-spine trauma.  Cardiac arrest.  CPR for 5 minutes. EXAM: CT HEAD WITHOUT  CONTRAST CT CERVICAL SPINE WITHOUT CONTRAST TECHNIQUE: Multidetector CT imaging of the head and cervical spine was performed following the standard protocol without intravenous contrast. Multiplanar CT image reconstructions of the cervical spine were also generated. COMPARISON:  CT scan February 21, 2017 FINDINGS: CT HEAD FINDINGS Brain: No subdural, epidural, or subarachnoid hemorrhage. Cerebellum demonstrates no acute abnormalities. A tiny lacunar infarct is seen in the right cerebellar hemisphere on axial image 8. Basal cisterns are unchanged unremarkable. Ventricles and sulci are stable. No acute cortical ischemia or infarct. No mass effect or midline shift. Vascular: Calcified atherosclerosis in the intracranial carotids. Skull: Normal. Negative for fracture or focal lesion. Sinuses/Orbits: There is opacification of scattered left mastoid air cells, improved since February 21, 2017. The right mastoid air cells, middle ears are normal. Scattered opacification of ethmoid air cells with fluid in the sphenoid sinuses and a mucous retention cyst in the left maxillary sinus. Other: None. CT CERVICAL SPINE FINDINGS Alignment: Normal. Skull base and vertebrae: No acute fracture. No primary bone lesion or focal pathologic process. Soft tissues and spinal canal: No prevertebral fluid or  swelling. No visible canal hematoma. Disc levels:  Multilevel degenerative changes. Upper chest: Mild opacity in the posterior aspect of the right upper lobe. Other: No other abnormalities. IMPRESSION: 1. No acute intracranial abnormalities. 2. No fracture or traumatic malalignment cervical spine. Multilevel degenerative changes. 3. Sinus disease as above. Electronically Signed   By: Dorise Bullion III M.D   On: 03/30/2018 19:02   [name] Head Wo Contrast  Result Date: 03/27/2018 CLINICAL DATA:  84 y/o M; cardiac arrest, CPR, ROSC in 5 minutes, total down time 10 minutes. EXAM: MRI HEAD WITHOUT CONTRAST MRA HEAD WITHOUT CONTRAST MRA NECK WITHOUT CONTRAST TECHNIQUE: Multiplanar, multiecho pulse sequences of the brain and surrounding structures were obtained without intravenous contrast. Angiographic images of the Circle of Willis were obtained using MRA technique without intravenous contrast. Angiographic images of the neck were obtained using MRA technique without intravenous contrast. Carotid stenosis measurements (when applicable) are obtained utilizing NASCET criteria, using the distal internal carotid diameter as the denominator. COMPARISON:  02/10/2017 MRA head and neck. 02/09/2017 MRI head. 04/02/2018 CT head and cervical spine. FINDINGS: MRI HEAD FINDINGS Brain: No acute infarction, hemorrhage, hydrocephalus, extra-axial collection or mass lesion. 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 Small chronic infarcts in the right cerebellar hemisphere. There are several foci of susceptibility hypointensity within the bilateral cerebral hemispheres and the cerebellum compatible with hemosiderin deposition from prior microhemorrhage, grossly stable from prior MRI given differences in technique and motion artifact. There is hemosiderin staining of the right superior cerebellar chronic infarction. Scattered background of nonspecific T2 FLAIR hyperintensities in white matter  compatible with mild chronic microvascular ischemic changes and there is mild volume loss of the brain. Vascular: As below. Skull and upper cervical spine: Normal marrow signal. Sinuses/Orbits: Diffuse paranasal sinus mucosal thickening and fluid levels as well as left mastoid opacification, probably due to intubation. No abnormal signal of the right mastoid air cells. Orbits are unremarkable. Other: None. MRA HEAD FINDINGS Internal carotid arteries: Patent. Carotid siphon irregularity compatible with atherosclerosis. No significant stenosis. Anterior cerebral arteries:  Patent. Middle cerebral arteries: Patent. Anterior communicating artery: Patent. Posterior communicating arteries: Fetal left PCA. No right posterior communicating artery identified, likely hypoplastic or absent. Posterior cerebral arteries: Patent. Small caliber right vertebral artery largely terminates in the right PICA with minimal contribution to the basilar. Basilar artery:  Patent. Vertebral arteries:  Patent. No evidence of high-grade stenosis, large vessel occlusion, or aneurysm unless noted above. MRA NECK FINDINGS Aortic arch: Not included within the field of view. Right common carotid artery: Patent. Right internal carotid artery: Patent. Suspected severe proximal right ICA stenosis. Right vertebral artery: Patent. Left common carotid artery: Patent. Left Internal carotid artery: Patent.Mild irregularity of left proximal ICA compatible with atherosclerosis without significant stenosis. Hairpin turn of mid left cervical ICA. Left Vertebral artery: Patent.  Left dominant. Motion degraded study. IMPRESSION: MRI head: 1. No acute intracranial abnormality identified. No findings of hypoxic ischemic injury to the brain at this time. 2. Small chronic infarctions are present in the left frontal lobe and right cerebellar hemisphere. 3. Stable background of mild chronic microvascular ischemic changes and volume loss of the brain. 4. Stable  scattered foci of chronic microhemorrhage probably related to chronic hypertension. MRA head: 1. Patent anterior and posterior intracranial circulation. No large vessel occlusion, high-grade stenosis, or aneurysm identified. 2. Atherosclerosis of carotid siphons without significant stenosis. MRA neck: 1. Motion degraded study. 2. Patent carotid and vertebral arteries. 3. Suspected severe proximal right ICA stenosis. Carotid ultrasound recommended to further assess. Electronically Signed   By: Kristine Garbe M.D.   On: 03/27/2018 01:49   [name] Neck Wo Contrast  Result Date: 03/27/2018 CLINICAL DATA:  84 y/o M; cardiac arrest, CPR, ROSC in 5 minutes, total down time 10 minutes. EXAM: MRI HEAD WITHOUT CONTRAST MRA HEAD WITHOUT CONTRAST MRA NECK WITHOUT CONTRAST TECHNIQUE: Multiplanar, multiecho pulse sequences of the brain and surrounding structures were obtained without intravenous contrast. Angiographic images of the Circle of Willis were obtained using MRA technique without intravenous contrast. Angiographic images of the neck were obtained using MRA technique without intravenous contrast. Carotid stenosis measurements (when applicable) are obtained utilizing NASCET criteria, using the distal internal carotid diameter as the denominator. COMPARISON:  02/10/2017 MRA head and neck. 02/09/2017 MRI head. 04/03/2018 CT head and cervical spine. FINDINGS: MRI HEAD FINDINGS Brain: No acute infarction, hemorrhage, hydrocephalus, extra-axial collection or mass lesion. 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 Small chronic infarcts in the right cerebellar hemisphere. There are several foci of susceptibility hypointensity within the bilateral cerebral hemispheres and the cerebellum compatible with hemosiderin deposition from prior microhemorrhage, grossly stable from prior MRI given differences in technique and motion artifact. There is hemosiderin staining of the right  superior cerebellar chronic infarction. Scattered background of nonspecific T2 FLAIR hyperintensities in white matter compatible with mild chronic microvascular ischemic changes and there is mild volume loss of the brain. Vascular: As below. Skull and upper cervical spine: Normal marrow signal. Sinuses/Orbits: Diffuse paranasal sinus mucosal thickening and fluid levels as well as left mastoid opacification, probably due to intubation. No abnormal signal of the right mastoid air cells. Orbits are unremarkable. Other: None. MRA HEAD FINDINGS Internal carotid arteries: Patent. Carotid siphon irregularity compatible with atherosclerosis. No significant stenosis. Anterior cerebral arteries:  Patent. Middle cerebral arteries: Patent. Anterior communicating artery: Patent. Posterior communicating arteries: Fetal left PCA. No right posterior communicating artery identified, likely hypoplastic or absent. Posterior cerebral arteries: Patent. Small caliber right vertebral artery largely terminates in the right PICA with minimal contribution to the basilar. Basilar artery:  Patent. Vertebral arteries:  Patent. No evidence of high-grade stenosis, large vessel occlusion, or aneurysm unless noted above. MRA NECK FINDINGS Aortic arch: Not included within the field of view. Right common carotid artery: Patent. Right internal carotid artery: Patent. Suspected severe proximal right ICA  stenosis. Right vertebral artery: Patent. Left common carotid artery: Patent. Left Internal carotid artery: Patent.Mild irregularity of left proximal ICA compatible with atherosclerosis without significant stenosis. Hairpin turn of mid left cervical ICA. Left Vertebral artery: Patent.  Left dominant. Motion degraded study. IMPRESSION: MRI head: 1. No acute intracranial abnormality identified. No findings of hypoxic ischemic injury to the brain at this time. 2. Small chronic infarctions are present in the left frontal lobe and right cerebellar  hemisphere. 3. Stable background of mild chronic microvascular ischemic changes and volume loss of the brain. 4. Stable scattered foci of chronic microhemorrhage probably related to chronic hypertension. MRA head: 1. Patent anterior and posterior intracranial circulation. No large vessel occlusion, high-grade stenosis, or aneurysm identified. 2. Atherosclerosis of carotid siphons without significant stenosis. MRA neck: 1. Motion degraded study. 2. Patent carotid and vertebral arteries. 3. Suspected severe proximal right ICA stenosis. Carotid ultrasound recommended to further assess. Electronically Signed   By: Kristine Garbe M.D.   On: 03/27/2018 01:49   Mr Brain Wo Contrast  Result Date: 03/27/2018 CLINICAL DATA:  84 y/o M; cardiac arrest, CPR, ROSC in 5 minutes, total down time 10 minutes. EXAM: MRI HEAD WITHOUT CONTRAST MRA HEAD WITHOUT CONTRAST MRA NECK WITHOUT CONTRAST TECHNIQUE: Multiplanar, multiecho pulse sequences of the brain and surrounding structures were obtained without intravenous contrast. Angiographic images of the Circle of Willis were obtained using MRA technique without intravenous contrast. Angiographic images of the neck were obtained using MRA technique without intravenous contrast. Carotid stenosis measurements (when applicable) are obtained utilizing NASCET criteria, using the distal internal carotid diameter as the denominator. COMPARISON:  02/10/2017 MRA head and neck. 02/09/2017 MRI head. 04/21/2018 CT head and cervical spine. FINDINGS: MRI HEAD FINDINGS Brain: No acute infarction, hemorrhage, hydrocephalus, extra-axial collection or mass lesion. 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 Small chronic infarcts in the right cerebellar hemisphere. There are several foci of susceptibility hypointensity within the bilateral cerebral hemispheres and the cerebellum compatible with hemosiderin deposition from prior microhemorrhage, grossly stable  from prior MRI given differences in technique and motion artifact. There is hemosiderin staining of the right superior cerebellar chronic infarction. Scattered background of nonspecific T2 FLAIR hyperintensities in white matter compatible with mild chronic microvascular ischemic changes and there is mild volume loss of the brain. Vascular: As below. Skull and upper cervical spine: Normal marrow signal. Sinuses/Orbits: Diffuse paranasal sinus mucosal thickening and fluid levels as well as left mastoid opacification, probably due to intubation. No abnormal signal of the right mastoid air cells. Orbits are unremarkable. Other: None. MRA HEAD FINDINGS Internal carotid arteries: Patent. Carotid siphon irregularity compatible with atherosclerosis. No significant stenosis. Anterior cerebral arteries:  Patent. Middle cerebral arteries: Patent. Anterior communicating artery: Patent. Posterior communicating arteries: Fetal left PCA. No right posterior communicating artery identified, likely hypoplastic or absent. Posterior cerebral arteries: Patent. Small caliber right vertebral artery largely terminates in the right PICA with minimal contribution to the basilar. Basilar artery:  Patent. Vertebral arteries:  Patent. No evidence of high-grade stenosis, large vessel occlusion, or aneurysm unless noted above. MRA NECK FINDINGS Aortic arch: Not included within the field of view. Right common carotid artery: Patent. Right internal carotid artery: Patent. Suspected severe proximal right ICA stenosis. Right vertebral artery: Patent. Left common carotid artery: Patent. Left Internal carotid artery: Patent.Mild irregularity of left proximal ICA compatible with atherosclerosis without significant stenosis. Hairpin turn of mid left cervical ICA. Left Vertebral artery: Patent.  Left dominant. Motion degraded  study. IMPRESSION: MRI head: 1. No acute intracranial abnormality identified. No findings of hypoxic ischemic injury to the brain  at this time. 2. Small chronic infarctions are present in the left frontal lobe and right cerebellar hemisphere. 3. Stable background of mild chronic microvascular ischemic changes and volume loss of the brain. 4. Stable scattered foci of chronic microhemorrhage probably related to chronic hypertension. MRA head: 1. Patent anterior and posterior intracranial circulation. No large vessel occlusion, high-grade stenosis, or aneurysm identified. 2. Atherosclerosis of carotid siphons without significant stenosis. MRA neck: 1. Motion degraded study. 2. Patent carotid and vertebral arteries. 3. Suspected severe proximal right ICA stenosis. Carotid ultrasound recommended to further assess. Electronically Signed   By: Kristine Garbe M.D.   On: 03/27/2018 01:49   Dg Chest Port 1 View  Result Date: 04/01/2018 CLINICAL DATA:  Dyspnea EXAM: PORTABLE CHEST 1 VIEW COMPARISON:  03/30/2018 FINDINGS: Cardiac shadow remains enlarged. Postsurgical changes are again seen. Bilateral pleural effusions are noted increasing on the right when compared with the prior exam. Persistent perihilar opacities are noted. No new focal abnormality is seen. IMPRESSION: Increasing right-sided pleural effusion. The remainder of the exam is stable from the prior study. Electronically Signed   By: Inez Catalina M.D.   On: 04/01/2018 09:53   Dg Chest Port 1 View  Result Date: 03/30/2018 CLINICAL DATA:  Post code and CPR. EXAM: PORTABLE CHEST 1 VIEW COMPARISON:  03/29/2018 FINDINGS: Prior CABG and aortic valve replacement. Cardiomegaly. Bilateral perihilar and lower lobe opacities with small to moderate layering effusions. No significant change since prior study. IMPRESSION: No interval change. Electronically Signed   By: Rolm Baptise M.D.   On: 03/30/2018 10:56   Dg Chest Port 1 View  Result Date: 03/29/2018 CLINICAL DATA:  Respiratory failure EXAM: PORTABLE CHEST 1 VIEW COMPARISON:  03/28/2018 FINDINGS: Interval removal of endotracheal  tube and NG tube. Cardiomegaly. Diffuse bilateral airspace opacities have worsened since prior study, likely worsening edema. Small to moderate layering bilateral effusions, also increased since prior study. IMPRESSION: Interval worsening it in diffuse bilateral airspace disease, likely worsening edema/CHF. Enlarging small to moderate bilateral effusions. Electronically Signed   By: Rolm Baptise M.D.   On: 03/29/2018 08:04   Dg Chest Port 1 View  Result Date: 03/28/2018 CLINICAL DATA:  Cardiac arrest EXAM: PORTABLE CHEST 1 VIEW COMPARISON:  03/27/2018 FINDINGS: Endotracheal and NG tubes are stable. Heart remains enlarged. Upper mediastinum remains prominent. Postoperative changes from CABG. Vascular congestion is stable. Stable haziness at both lung bases likely represents a combination of volume loss and pleural fluid. No pneumothorax. IMPRESSION: Stable vascular congestion and bibasilar haziness as described. Electronically Signed   By: Marybelle Killings M.D.   On: 03/28/2018 08:02   Dg Chest Port 1 View  Result Date: 03/27/2018 CLINICAL DATA:  Check endotracheal tube placement EXAM: PORTABLE CHEST 1 VIEW COMPARISON:  03/30/2018 FINDINGS: Cardiac shadow remains enlarged. Postsurgical changes are again identified. Endotracheal tube and nasogastric catheter are noted in satisfactory position. The lungs are well aerated with some patchy right basilar opacities new from the prior exam. Small bilateral pleural effusion are noted as well. No pneumothorax is seen. IMPRESSION: Increase in right basilar opacities with small bilateral pleural effusions. Electronically Signed   By: Inez Catalina M.D.   On: 03/27/2018 08:37   Dg Chest Portable 1 View  Result Date: 04/22/2018 CLINICAL DATA:  Intubated EXAM: PORTABLE CHEST 1 VIEW COMPARISON:  02/19/2017, 02/21/2017 FINDINGS: Endotracheal tube tip is about 3.8 cm superior to the  carina. Esophageal tube tip is below the diaphragm but non included. Post sternotomy changes.  Cardiomegaly with mild central congestion. Similar appearance of left pleural blunting. Aortic atherosclerosis. No pneumothorax. IMPRESSION: 1. Endotracheal tube tip about 3.8 cm superior to the carina. Esophageal tube tip below the diaphragm but non included. 2. Cardiomegaly with vascular congestion Electronically Signed   By: Donavan Foil M.D.   On: 04/16/2018 17:15     LOS: 10 days   Oren Binet, MD  [facility]  If 7PM-7AM, please contact night-coverage  Please page via [email or web address]-click on MD name and type text message  13-Apr-2018, 11:36 AM

## 2018-04-25 NOTE — Progress Notes (Addendum)
   19-Apr-2018 1600  Clinical Encounter Type  Visited With Patient  Visit Type Initial  Referral From Nurse  Spiritual Encounters  Spiritual Needs Grief support  Responded to [facility] consult for End of life. Arrived and patient already died just minutes before my arrival. Spoke with family and provided grief support. Gathered funeral home and contact information from patient's wife. Gave information the nurse. Chaplain Matthew Folks [phone]

## 2018-04-25 NOTE — Progress Notes (Signed)
Daily Progress Note   Patient Name: Terry Wu       Date: 2018/05/04 DOB: 1932/05/28  Age: 84 y.o. MRN#: [id number] Attending Physician: Jonetta Osgood, MD Primary Care Physician: Sinda Du, MD Admit Date: 03/25/2018  Reason for Consultation/Follow-up: Establishing goals of care  Subjective: Patient in bed. Some agitation noted- moving legs, reaching with arms. Work of breathing somewhat increased, but less use of accessory muscles than yesterday. Received dose of morphine this morning around 0545. Patient will open eyes briefly to voice, coughing up brown phegm (likely drainage from epistaxis).  Discussed further comfort measures with family. They are very concerned that patient does not experience distress or gasping. They inquired about discontinuing packing in patient's nose. I recommended leaving it in place due to risk of further bleeding.   Discussed starting low dose morphine infusion with family. Discussed GOC to gain patient's comfort with medication and eventually d/c oxygen mask when mask is no longer providing comfort.  Family is in agreement.   Review of Systems  Unable to perform ROS: Acuity of condition    Length of Stay: 10  Current Medications: Scheduled Meds:  . chlorhexidine  15 mL Mouth Rinse BID  . mouth rinse  15 mL Mouth Rinse q12n4p    Continuous Infusions: . morphine      PRN Meds: acetaminophen **OR** acetaminophen, antiseptic oral rinse, [DISCONTINUED] glycopyrrolate **OR** [DISCONTINUED] glycopyrrolate **OR** glycopyrrolate, [DISCONTINUED] haloperidol **OR** [DISCONTINUED] haloperidol **OR** haloperidol lactate, [DISCONTINUED] LORazepam **OR** [DISCONTINUED] LORazepam **OR** LORazepam, morphine, ondansetron **OR** ondansetron (ZOFRAN) IV,  polyvinyl alcohol  Physical Exam  Cardiovascular: Normal rate.  Pulmonary/Chest:  Tachypneic, increased effort  Neurological:  Lethargic, opens eyes briefly to voice and touch  Nursing note and vitals reviewed.           Vital Signs: BP (!) 152/90   Pulse 85   Temp 98.5 F (36.9 C) (Oral)   Resp (!) 32   Ht 5\' 9"  (1.753 m)   Wt 112.3 kg   SpO2 98%   BMI 36.56 kg/m  SpO2: SpO2: 98 % O2 Device: O2 Device: Venturi Mask O2 Flow Rate: O2 Flow Rate (L/min): 6 L/min  Intake/output summary:   Intake/Output Summary (Last 24 hours) at 05/04/18 0936 Last data filed at 05-04-18 0318 Gross per 24 hour  Intake -  Output 2050  ml  Net -2050 ml   LBM: Last BM Date: 04/04/18 Baseline Weight: Weight: 99.8 kg Most recent weight: Weight: 112.3 kg       Palliative Assessment/Data: PPS: 10%    Flowsheet Rows     Most Recent Value  Intake Tab  Referral Department  Hospitalist  Unit at Time of Referral  Intermediate Care Unit  Palliative Care Primary Diagnosis  Cardiac  Date Notified  04/03/18  Palliative Care Type  New Palliative care  Reason for referral  Clarify Goals of Care  Date of Admission  03/28/2018  Date first seen by Palliative Care  04/03/18  # of days Palliative referral response time  0 Day(s)  # of days IP prior to Palliative referral  8  Clinical Assessment  Psychosocial & Spiritual Assessment  Palliative Care Outcomes      Patient Active Problem List   Diagnosis Date Noted  . Advanced care planning/counseling discussion   . Goals of care, counseling/discussion   . Terminal care   . Pressure injury of skin 03/27/2018  . Cardiac arrest ([place]) 04/11/2018  . Bradycardia   . S/P AVR   . Hx of CABG     Palliative Care Assessment & Plan   Patient Profile: 84 y.o.malewith past medical history of severe COPD, CAD/CABG, Aortic Valve replacement, colon cancer s/p resection in 2018, CVA, atrial fibrillation, DMwho was admitted on 10/2/2019with cardiac  arrest. The patient required 5 minutes of resuscitation prior to [place]. He was intubated in the ER for airway protection. He has since been extubated, but has suffered with recurrent hypercarbia despite treatment with BiPap. His hospitalization has also been complicated by diastolic heart failure decompensation and severe epistaxis. He is now off anticoagulation due to epistaxis and hematuria.   Assessment/Recommendations/Plan   Patient dying due to severe COPD, s/p cardiac arrest, family has decided to transition to comfort measures only  Will start continuous morphine infusion 1mg /hr with 2 mg bolus q15 min prn for comfort  When comfort achieved with meds will begin to titrate off venti mask  Goals of Care and Additional Recommendations:  Limitations on Scope of Treatment: Full Comfort Care  Code Status:  DNR  Prognosis:   Hours - Days  Discharge Planning:  Anticipated Hospital Death  Care plan was discussed with patient's family, patient's RN, and Dr. Sloan Leiter.  Thank you for allowing the Palliative Medicine Team to assist in the care of this patient.   Time In: 0900 Time Out: 0945 Total Time 45 mins Prolonged Time Billed no      Greater than 50%  of this time was spent counseling and coordinating care related to the above assessment and plan.  Mariana Kaufman, AGNP-C Palliative Medicine   Please contact Palliative Medicine Team phone at [phone] for questions and concerns.       \

## 2018-04-25 DEATH — deceased

## 2018-04-29 DIAGNOSIS — Z515 Encounter for palliative care: Secondary | ICD-10-CM

## 2018-07-23 ENCOUNTER — Ambulatory Visit: Payer: Medicare Other | Admitting: Adult Health
